# Patient Record
Sex: Female | Born: 1940
Health system: Southern US, Community
[De-identification: ages and names within clinical notes are randomized; demographics above are authoritative.]

## PROBLEM LIST (undated history)

## (undated) DIAGNOSIS — M797 Fibromyalgia: Secondary | ICD-10-CM

## (undated) DIAGNOSIS — R42 Dizziness and giddiness: Secondary | ICD-10-CM

## (undated) DIAGNOSIS — K5790 Diverticulosis of intestine, part unspecified, without perforation or abscess without bleeding: Secondary | ICD-10-CM

## (undated) DIAGNOSIS — K589 Irritable bowel syndrome without diarrhea: Secondary | ICD-10-CM

## (undated) DIAGNOSIS — K2970 Gastritis, unspecified, without bleeding: Secondary | ICD-10-CM

## (undated) DIAGNOSIS — C50919 Malignant neoplasm of unspecified site of unspecified female breast: Secondary | ICD-10-CM

## (undated) DIAGNOSIS — M1990 Unspecified osteoarthritis, unspecified site: Secondary | ICD-10-CM

## (undated) DIAGNOSIS — E039 Hypothyroidism, unspecified: Secondary | ICD-10-CM

## (undated) DIAGNOSIS — J189 Pneumonia, unspecified organism: Secondary | ICD-10-CM

## (undated) DIAGNOSIS — I2510 Atherosclerotic heart disease of native coronary artery without angina pectoris: Secondary | ICD-10-CM

## (undated) DIAGNOSIS — F32A Depression, unspecified: Secondary | ICD-10-CM

## (undated) DIAGNOSIS — K222 Esophageal obstruction: Secondary | ICD-10-CM

## (undated) DIAGNOSIS — S060X9A Concussion with loss of consciousness of unspecified duration, initial encounter: Secondary | ICD-10-CM

## (undated) DIAGNOSIS — E7800 Pure hypercholesterolemia, unspecified: Secondary | ICD-10-CM

## (undated) DIAGNOSIS — K449 Diaphragmatic hernia without obstruction or gangrene: Secondary | ICD-10-CM

## (undated) DIAGNOSIS — K635 Polyp of colon: Secondary | ICD-10-CM

## (undated) DIAGNOSIS — I6529 Occlusion and stenosis of unspecified carotid artery: Secondary | ICD-10-CM

## (undated) DIAGNOSIS — K2990 Gastroduodenitis, unspecified, without bleeding: Secondary | ICD-10-CM

## (undated) DIAGNOSIS — K219 Gastro-esophageal reflux disease without esophagitis: Secondary | ICD-10-CM

## (undated) DIAGNOSIS — G4730 Sleep apnea, unspecified: Secondary | ICD-10-CM

## (undated) DIAGNOSIS — F329 Major depressive disorder, single episode, unspecified: Secondary | ICD-10-CM

## (undated) DIAGNOSIS — R112 Nausea with vomiting, unspecified: Secondary | ICD-10-CM

## (undated) DIAGNOSIS — Z5189 Encounter for other specified aftercare: Secondary | ICD-10-CM

## (undated) DIAGNOSIS — E119 Type 2 diabetes mellitus without complications: Secondary | ICD-10-CM

## (undated) DIAGNOSIS — T7840XA Allergy, unspecified, initial encounter: Secondary | ICD-10-CM

## (undated) DIAGNOSIS — M810 Age-related osteoporosis without current pathological fracture: Secondary | ICD-10-CM

## (undated) DIAGNOSIS — Z9889 Other specified postprocedural states: Secondary | ICD-10-CM

## (undated) DIAGNOSIS — K8020 Calculus of gallbladder without cholecystitis without obstruction: Secondary | ICD-10-CM

## (undated) DIAGNOSIS — D649 Anemia, unspecified: Secondary | ICD-10-CM

## (undated) DIAGNOSIS — N200 Calculus of kidney: Secondary | ICD-10-CM

## (undated) DIAGNOSIS — I209 Angina pectoris, unspecified: Secondary | ICD-10-CM

## (undated) DIAGNOSIS — I10 Essential (primary) hypertension: Secondary | ICD-10-CM

## (undated) DIAGNOSIS — F419 Anxiety disorder, unspecified: Secondary | ICD-10-CM

## (undated) DIAGNOSIS — E079 Disorder of thyroid, unspecified: Secondary | ICD-10-CM

## (undated) HISTORY — DX: Calculus of gallbladder without cholecystitis without obstruction: K80.20

## (undated) HISTORY — DX: Allergy, unspecified, initial encounter: T78.40XA

## (undated) HISTORY — PX: APPENDECTOMY: SHX54

## (undated) HISTORY — DX: Gastro-esophageal reflux disease without esophagitis: K21.9

## (undated) HISTORY — DX: Atherosclerotic heart disease of native coronary artery without angina pectoris: I25.10

## (undated) HISTORY — DX: Age-related osteoporosis without current pathological fracture: M81.0

## (undated) HISTORY — DX: Irritable bowel syndrome, unspecified: K58.9

## (undated) HISTORY — DX: Major depressive disorder, single episode, unspecified: F32.9

## (undated) HISTORY — PX: BREAST LUMPECTOMY: SHX2

## (undated) HISTORY — DX: Calculus of kidney: N20.0

## (undated) HISTORY — DX: Depression, unspecified: F32.A

## (undated) HISTORY — PX: DILATION AND CURETTAGE OF UTERUS: SHX78

## (undated) HISTORY — PX: CHOLECYSTECTOMY: SHX55

## (undated) HISTORY — DX: Fibromyalgia: M79.7

## (undated) HISTORY — DX: Pure hypercholesterolemia, unspecified: E78.00

## (undated) HISTORY — DX: Esophageal obstruction: K22.2

## (undated) HISTORY — DX: Polyp of colon: K63.5

## (undated) HISTORY — DX: Malignant neoplasm of unspecified site of unspecified female breast: C50.919

## (undated) HISTORY — PX: CYSTOCELE REPAIR: SHX163

## (undated) HISTORY — DX: Sleep apnea, unspecified: G47.30

## (undated) HISTORY — DX: Anemia, unspecified: D64.9

## (undated) HISTORY — DX: Diaphragmatic hernia without obstruction or gangrene: K44.9

## (undated) HISTORY — DX: Gastritis, unspecified, without bleeding: K29.70

## (undated) HISTORY — DX: Diverticulosis of intestine, part unspecified, without perforation or abscess without bleeding: K57.90

## (undated) HISTORY — DX: Type 2 diabetes mellitus without complications: E11.9

## (undated) HISTORY — DX: Anxiety disorder, unspecified: F41.9

## (undated) HISTORY — DX: Essential (primary) hypertension: I10

## (undated) HISTORY — DX: Disorder of thyroid, unspecified: E07.9

## (undated) HISTORY — DX: Concussion with loss of consciousness of unspecified duration, initial encounter: S06.0X9A

## (undated) HISTORY — DX: Gastroduodenitis, unspecified, without bleeding: K29.90

## (undated) HISTORY — DX: Encounter for other specified aftercare: Z51.89

## (undated) HISTORY — PX: CERVICAL FUSION: SHX112

## (undated) HISTORY — PX: TONSILLECTOMY: SUR1361

## (undated) HISTORY — PX: THYROIDECTOMY: SHX17

## (undated) HISTORY — PX: RECTOCELE REPAIR: SHX761

## (undated) HISTORY — PX: CARDIAC CATHETERIZATION: SHX172

## (undated) HISTORY — PX: TOTAL ABDOMINAL HYSTERECTOMY: SHX209

## (undated) NOTE — *Deleted (*Deleted)
PROGRESS NOTE    Jeanne Kim  ZOX:[id number] DOB: 04-17-1941 DOA: 08/05/2020 PCP: Toma Deiters, MD   Brief Narrative: Jeanne Kim is a 12 y.o. female, past medical history of hypertension, CAD, hypothyroidism, breast cancer, fibromyalgia, irritable bowel syndrome, hypothyroidism, diabetes mellitus ED secondary to melena, patient with history of irritable bowel syndrome, most recent endoscopy 2017 with evidence of gastric ulcer, colonoscopy 2018 with internal hemorrhoids and diverticulosis. Patient presented secondary to melena and found to have associated acute anemia with concern for upper GI bleed.   Assessment & Plan:   Active Problems:   HYPERCHOLESTEROLEMIA  IIA   Essential hypertension   Coronary atherosclerosis   Esophageal reflux   IBS (irritable bowel syndrome)   DM (diabetes mellitus) (HCC)   Diverticulosis of colon   Adult hypothyroidism   GI bleed   Symptomatic anemia Acute blood loss anemia Baseline hemoglobin of 9 from September 2021. Hemoglobin of 1.1 on admission secondary to blood loss from GI bleeding. Patient received 1 unit of PRBC. Hemoglobin rebound to 8 and stable at 7.8. Recurrent melena overnight -Check CBC today  Upper GI bleed History of gastric ulcers and associated melena. Patient is s/p EGD on 10/30 with evidence of multiple non-bleeding gastric ulcers and LA Grade A reflux esophagitis without bleeding. Recurrent melena which may be due to continuing bleeding vs residual from prior bleeding. -GI recommendations: Okay for discharge home yesterday -CBC as mentioned above  History of CAD On aspirin as an outpatient. Held secondary to above. Also on Toprol XL.  -Continue Toprol XL  Essential hypertension Patient is on ramipril and Toprol XL as an outpatient -Continue Toprol  Fibromyalgia Chronic pain syndrome Patient is on Fentanyl patch, gabapentin as an outpatient -Resume Fentanyl patch and continue gabapentin  Irritable bowel  syndrome -Continue linzess  Diabetes mellitus, type 2 Patient is on Lantus, metformin, glipizide, glyburide and Novolog -Continue Lantus and SSI  Hyperlipidemia Patient was on a statin for which she has stopped taking. Outpatient follow-up.  Hypokalemia Mild. Given potassium in IV fluids -Kdur 40 meq x2  Hyponatremia Mild. Improved with IV fluids. Can recheck BMP as an outpatient.  History of UTI Completed course outpatient.  Hypothyroidism -Continue Synthroid  Possible orthostatic hypotension Patient is on midodrine for lightheadedness when getting out of bed in the morning. This is a chronic medication for which she takes in addition to antihypertensives. -Continue midodrine   DVT prophylaxis: SCDs Code Status:   Code Status: Full Code Family Communication: None at bedside Disposition Plan: Home likely in 1-2 days pending GI recommendations/management***   Consultants:   Gastroenterology  Procedures:   None  Antimicrobials:  None    Subjective: Recurrent melena reported as black and mucous.  Objective: Vitals:   08/06/20 1100 08/06/20 1347 08/06/20 2233 08/07/20 0500  BP: (!) 109/42 127/80 (!) 121/52 (!) 127/50  Pulse: 73 (!) 111 81   Resp: 19 18  17   Temp:  98.2 F (36.8 C) 98.4 F (36.9 C) 99 F (37.2 C)  TempSrc:   Oral Oral  SpO2: 97% 99% 95% 96%  Weight:      Height:        Intake/Output Summary (Last 24 hours) at 08/07/2020 0750 Last data filed at 08/06/2020 1821 Gross per 24 hour  Intake 468.99 ml  Output -  Net 468.99 ml   Filed Weights   08/05/20 1726 08/05/20 2246  Weight: 77.1 kg 80.8 kg    Examination:  General exam: Appears calm and comfortable  Respiratory system: Clear to auscultation. Respiratory effort normal. Cardiovascular system: S1 & S2 heard, RRR. No murmurs, rubs, gallops or clicks. Gastrointestinal system: Abdomen is nondistended, soft and nontender. No organomegaly or masses felt. Normal bowel sounds heard.  Central nervous system: Alert and oriented. No focal neurological deficits. Musculoskeletal: No edema. No calf tenderness Skin: No cyanosis. No rashes Psychiatry: Judgement and insight appear normal. Mood & affect appropriate.     Data Reviewed: I have personally reviewed following labs and imaging studies  CBC Lab Results  Component Value Date   WBC 9.7 08/06/2020   RBC 3.25 (L) 08/06/2020   HGB 7.8 (L) 08/06/2020   HCT 26.1 (L) 08/06/2020   MCV 80.3 08/06/2020   MCH 24.0 (L) 08/06/2020   PLT 407 (H) 08/06/2020   MCHC 29.9 (L) 08/06/2020   RDW 19.0 (H) 08/06/2020   LYMPHSABS 2.8 08/05/2020   MONOABS 2.0 (H) 08/05/2020   EOSABS 1.2 (H) 08/05/2020   BASOSABS 0.1 08/05/2020     Last metabolic panel Lab Results  Component Value Date   NA 133 (L) 08/06/2020   K 3.1 (L) 08/06/2020   CL 96 (L) 08/06/2020   CO2 28 08/06/2020   BUN 15 08/06/2020   CREATININE 0.78 08/06/2020   GLUCOSE 172 (H) 08/06/2020   GFRNONAA >60 08/06/2020   GFRAA 72 07/14/2020   CALCIUM 8.4 (L) 08/06/2020   PROT 7.0 08/05/2020   ALBUMIN 3.7 08/05/2020   BILITOT 0.6 08/05/2020   ALKPHOS 52 08/05/2020   AST 18 08/05/2020   ALT 13 08/05/2020   ANIONGAP 9 08/06/2020    CBG (last 3)  Recent Labs    08/06/20 1646 08/06/20 2211 08/07/20 0729  GLUCAP 239* 271* 151*     GFR: Estimated Creatinine Clearance: 60.5 mL/min (by C-G formula based on SCr of 0.78 mg/dL).  Coagulation Profile: Recent Labs  Lab 08/05/20 1805  INR 1.0    Recent Results (from the past 240 hour(s))  Resp Panel by RT PCR (RSV, Flu A&B, Covid) - Nasopharyngeal Swab     Status: None   Collection Time: 08/05/20  6:43 PM   Specimen: Nasopharyngeal Swab  Result Value Ref Range Status   SARS Coronavirus 2 by RT PCR NEGATIVE NEGATIVE Final    Comment: (NOTE) SARS-CoV-2 target nucleic acids are NOT DETECTED.  The SARS-CoV-2 RNA is generally detectable in upper respiratoy specimens during the acute phase of infection. The  lowest concentration of SARS-CoV-2 viral copies this assay can detect is 131 copies/mL. A negative result does not preclude SARS-Cov-2 infection and should not be used as the sole basis for treatment or other patient management decisions. A negative result may occur with  improper specimen collection/handling, submission of specimen other than nasopharyngeal swab, presence of viral mutation(s) within the areas targeted by this assay, and inadequate number of viral copies (<131 copies/mL). A negative result must be combined with clinical observations, patient history, and epidemiological information. The expected result is Negative.  Fact Sheet for Patients:  https://www.moore.com/  Fact Sheet for Healthcare Providers:  https://www.young.biz/  This test is no t yet approved or cleared by the [place] FDA and  has been authorized for detection and/or diagnosis of SARS-CoV-2 by FDA under an Emergency Use Authorization (EUA). This EUA will remain  in effect (meaning this test can be used) for the duration of the COVID-19 declaration under Section 564(b)(1) of the Act, 21 U.S.C. section 360bbb-3(b)(1), unless the authorization is terminated or revoked sooner.     Influenza A by  PCR NEGATIVE NEGATIVE Final   Influenza B by PCR NEGATIVE NEGATIVE Final    Comment: (NOTE) The Xpert Xpress SARS-CoV-2/FLU/RSV assay is intended as an aid in  the diagnosis of influenza from Nasopharyngeal swab specimens and  should not be used as a sole basis for treatment. Nasal washings and  aspirates are unacceptable for Xpert Xpress SARS-CoV-2/FLU/RSV  testing.  Fact Sheet for Patients: https://www.moore.com/  Fact Sheet for Healthcare Providers: https://www.young.biz/  This test is not yet approved or cleared by the [place] FDA and  has been authorized for detection and/or diagnosis of SARS-CoV-2 by  FDA under  an Emergency Use Authorization (EUA). This EUA will remain  in effect (meaning this test can be used) for the duration of the  Covid-19 declaration under Section 564(b)(1) of the Act, 21  U.S.C. section 360bbb-3(b)(1), unless the authorization is  terminated or revoked.    Respiratory Syncytial Virus by PCR NEGATIVE NEGATIVE Final    Comment: (NOTE) Fact Sheet for Patients: https://www.moore.com/  Fact Sheet for Healthcare Providers: https://www.young.biz/  This test is not yet approved or cleared by the [place] FDA and  has been authorized for detection and/or diagnosis of SARS-CoV-2 by  FDA under an Emergency Use Authorization (EUA). This EUA will remain  in effect (meaning this test can be used) for the duration of the  COVID-19 declaration under Section 564(b)(1) of the Act, 21 U.S.C.  section 360bbb-3(b)(1), unless the authorization is terminated or  revoked. Performed at [facility], [address] [place], [place] [address]         Radiology Studies: CT ABDOMEN PELVIS W WO CONTRAST  Result Date: 08/06/2020 CLINICAL DATA:  Left lower quadrant abdominal pain, melena, EXAM: CT ABDOMEN AND PELVIS WITHOUT AND WITH CONTRAST TECHNIQUE: Multidetector CT imaging of the abdomen and pelvis was performed following the standard protocol before and following the bolus administration of intravenous contrast. CONTRAST:  OMNIPAQUE IOHEXOL 300 MG/ML  SOLN COMPARISON:  None. FINDINGS: Lower chest: The visualized lung bases are clear bilaterally. The visualized heart and pericardium are unremarkable. Moderate hiatal hernia. Hepatobiliary: Status post cholecystectomy. Liver unremarkable. No intra or extrahepatic biliary ductal dilation. Pancreas: Diffusely atrophic, but otherwise unremarkable. Spleen: Unremarkable Adrenals/Urinary Tract: Adrenal glands unremarkable. Kidneys are normal. The bladder is partially obscured by streak artifact from left  total hip arthroplasty, however, the visualized portion is unremarkable peer Stomach/Bowel: Intra-abdominal stomach is unremarkable. Small and large bowel are unremarkable. No evidence of obstruction or focal inflammation. Appendix normal. No free intraperitoneal gas or fluid. Tiny fat containing umbilical hernia. Vascular/Lymphatic: Moderate aortoiliac atherosclerotic calcification. No aneurysm. No pathologic adenopathy within the abdomen and pelvis. Reproductive: Status post hysterectomy. No adnexal masses. Other: Rectum unremarkable Musculoskeletal: No lytic or blastic bone lesions are seen. Degenerative changes are seen within the thoracolumbar spine. Left total hip arthroplasty has been performed. IMPRESSION: No radiographic explanation for the patient's reported symptoms. Incidental findings as noted above. Aortic Atherosclerosis (ICD10-I70.0). Electronically Signed   By: Helyn Numbers MD   On: 08/06/2020 00:24   DG Chest Port 1 View  Result Date: 08/05/2020 CLINICAL DATA:  Weakness. EXAM: PORTABLE CHEST 1 VIEW COMPARISON:  02/03/2020 rib/chest radiograph and prior. FINDINGS: Mild hypoinflation. No focal consolidation, pneumothorax or pleural effusion. Cardiomediastinal silhouette is unchanged. Aortic atherosclerotic calcifications. Sequela of ACDF. IMPRESSION: No focal airspace disease. Electronically Signed   By: Stana Bunting M.D.   On: 08/05/2020 18:15        Scheduled Meds: . doxepin  10 mg Oral  QHS  . fentaNYL  1 patch Transdermal Q72H  . gabapentin  300 mg Oral QHS  . insulin aspart  0-5 Units Subcutaneous QHS  . insulin aspart  0-9 Units Subcutaneous TID WC  . insulin glargine  15 Units Subcutaneous Daily  . levothyroxine  137 mcg Oral Daily  . metoprolol succinate  50 mg Oral Daily  . midodrine  2.5 mg Oral TID  . montelukast  10 mg Oral QHS  . [START ON 08/09/2020] pantoprazole  40 mg Intravenous Q12H  . tobramycin  1 drop Right Eye Q6H   Continuous Infusions: .  sodium chloride    . pantoprozole (PROTONIX) infusion 8 mg/hr (08/06/20 0054)     LOS: 2 days     Jacquelin Hawking, MD [facility] 08/07/2020, 7:50 AM  If 7PM-7AM, please contact night-coverage [email or web address]

---

## 1999-03-22 ENCOUNTER — Observation Stay (HOSPITAL_COMMUNITY): Admission: AD | Admit: 1999-03-22 | Discharge: 1999-03-23 | Payer: Self-pay | Admitting: Cardiology

## 1999-03-22 ENCOUNTER — Encounter: Payer: Self-pay | Admitting: Cardiology

## 1999-05-04 ENCOUNTER — Ambulatory Visit: Admission: RE | Admit: 1999-05-04 | Discharge: 1999-05-04 | Payer: Self-pay | Admitting: Pulmonary Disease

## 1999-07-07 ENCOUNTER — Ambulatory Visit: Admission: RE | Admit: 1999-07-07 | Discharge: 1999-07-07 | Payer: Self-pay | Admitting: Pulmonary Disease

## 2000-05-08 ENCOUNTER — Ambulatory Visit (HOSPITAL_COMMUNITY): Admission: RE | Admit: 2000-05-08 | Discharge: 2000-05-08 | Payer: Self-pay | Admitting: Gastroenterology

## 2000-05-08 ENCOUNTER — Encounter (INDEPENDENT_AMBULATORY_CARE_PROVIDER_SITE_OTHER): Payer: Self-pay | Admitting: Specialist

## 2000-09-10 ENCOUNTER — Encounter: Payer: Self-pay | Admitting: Gynecology

## 2000-09-10 ENCOUNTER — Other Ambulatory Visit: Admission: RE | Admit: 2000-09-10 | Discharge: 2000-09-10 | Payer: Self-pay | Admitting: Gynecology

## 2000-09-10 ENCOUNTER — Encounter: Admission: RE | Admit: 2000-09-10 | Discharge: 2000-09-10 | Payer: Self-pay | Admitting: Gynecology

## 2000-10-08 DIAGNOSIS — E079 Disorder of thyroid, unspecified: Secondary | ICD-10-CM

## 2000-10-08 HISTORY — DX: Disorder of thyroid, unspecified: E07.9

## 2001-04-17 ENCOUNTER — Encounter: Payer: Self-pay | Admitting: *Deleted

## 2001-04-17 ENCOUNTER — Encounter: Admission: RE | Admit: 2001-04-17 | Discharge: 2001-04-17 | Payer: Self-pay | Admitting: *Deleted

## 2001-05-01 ENCOUNTER — Encounter: Payer: Self-pay | Admitting: *Deleted

## 2001-05-02 ENCOUNTER — Ambulatory Visit (HOSPITAL_COMMUNITY): Admission: RE | Admit: 2001-05-02 | Discharge: 2001-05-02 | Payer: Self-pay | Admitting: *Deleted

## 2001-05-02 ENCOUNTER — Encounter: Payer: Self-pay | Admitting: Diagnostic Radiology

## 2002-05-17 ENCOUNTER — Emergency Department (HOSPITAL_COMMUNITY): Admission: EM | Admit: 2002-05-17 | Discharge: 2002-05-17 | Payer: Self-pay | Admitting: Emergency Medicine

## 2002-06-16 ENCOUNTER — Ambulatory Visit (HOSPITAL_BASED_OUTPATIENT_CLINIC_OR_DEPARTMENT_OTHER): Admission: RE | Admit: 2002-06-16 | Discharge: 2002-06-16 | Payer: Self-pay | Admitting: Pulmonary Disease

## 2003-01-27 ENCOUNTER — Encounter: Payer: Self-pay | Admitting: Gynecology

## 2003-01-27 ENCOUNTER — Other Ambulatory Visit: Admission: RE | Admit: 2003-01-27 | Discharge: 2003-01-27 | Payer: Self-pay | Admitting: Gynecology

## 2003-01-27 ENCOUNTER — Encounter: Admission: RE | Admit: 2003-01-27 | Discharge: 2003-01-27 | Payer: Self-pay | Admitting: Gynecology

## 2003-11-16 ENCOUNTER — Inpatient Hospital Stay (HOSPITAL_COMMUNITY): Admission: RE | Admit: 2003-11-16 | Discharge: 2003-11-19 | Payer: Self-pay | Admitting: Neurosurgery

## 2004-01-12 ENCOUNTER — Encounter: Payer: Self-pay | Admitting: Cardiology

## 2004-01-28 ENCOUNTER — Encounter: Admission: RE | Admit: 2004-01-28 | Discharge: 2004-01-28 | Payer: Self-pay | Admitting: Gynecology

## 2004-01-28 ENCOUNTER — Other Ambulatory Visit: Admission: RE | Admit: 2004-01-28 | Discharge: 2004-01-28 | Payer: Self-pay | Admitting: Gynecology

## 2004-10-05 ENCOUNTER — Inpatient Hospital Stay (HOSPITAL_COMMUNITY): Admission: EM | Admit: 2004-10-05 | Discharge: 2004-10-06 | Payer: Self-pay | Admitting: Emergency Medicine

## 2004-10-05 ENCOUNTER — Ambulatory Visit: Payer: Self-pay | Admitting: Cardiology

## 2004-10-12 ENCOUNTER — Ambulatory Visit: Payer: Self-pay | Admitting: Cardiology

## 2004-10-17 ENCOUNTER — Inpatient Hospital Stay (HOSPITAL_BASED_OUTPATIENT_CLINIC_OR_DEPARTMENT_OTHER): Admission: RE | Admit: 2004-10-17 | Discharge: 2004-10-17 | Payer: Self-pay | Admitting: Cardiology

## 2004-10-25 ENCOUNTER — Ambulatory Visit: Payer: Self-pay | Admitting: Cardiology

## 2004-11-01 ENCOUNTER — Ambulatory Visit: Payer: Self-pay | Admitting: Cardiology

## 2004-11-01 ENCOUNTER — Ambulatory Visit: Payer: Self-pay

## 2004-11-17 ENCOUNTER — Ambulatory Visit: Payer: Self-pay | Admitting: Cardiology

## 2004-11-21 ENCOUNTER — Ambulatory Visit: Payer: Self-pay | Admitting: Cardiology

## 2004-12-26 ENCOUNTER — Ambulatory Visit: Payer: Self-pay | Admitting: Cardiology

## 2005-01-03 ENCOUNTER — Ambulatory Visit: Payer: Self-pay | Admitting: Cardiology

## 2005-01-29 ENCOUNTER — Other Ambulatory Visit: Admission: RE | Admit: 2005-01-29 | Discharge: 2005-01-29 | Payer: Self-pay | Admitting: Gynecology

## 2005-01-29 ENCOUNTER — Encounter: Admission: RE | Admit: 2005-01-29 | Discharge: 2005-01-29 | Payer: Self-pay | Admitting: Gynecology

## 2005-02-05 ENCOUNTER — Encounter: Admission: RE | Admit: 2005-02-05 | Discharge: 2005-02-05 | Payer: Self-pay | Admitting: Gynecology

## 2005-08-22 ENCOUNTER — Encounter: Admission: RE | Admit: 2005-08-22 | Discharge: 2005-08-22 | Payer: Self-pay | Admitting: Gynecology

## 2005-11-13 ENCOUNTER — Ambulatory Visit: Payer: Self-pay | Admitting: Gastroenterology

## 2005-11-28 ENCOUNTER — Ambulatory Visit: Payer: Self-pay | Admitting: Cardiology

## 2006-01-24 ENCOUNTER — Other Ambulatory Visit: Admission: RE | Admit: 2006-01-24 | Discharge: 2006-01-24 | Payer: Self-pay | Admitting: Gynecology

## 2006-01-31 ENCOUNTER — Ambulatory Visit: Payer: Self-pay | Admitting: Cardiology

## 2006-02-08 ENCOUNTER — Encounter: Admission: RE | Admit: 2006-02-08 | Discharge: 2006-02-08 | Payer: Self-pay | Admitting: Gynecology

## 2006-03-26 ENCOUNTER — Ambulatory Visit: Payer: Self-pay | Admitting: Gastroenterology

## 2006-03-27 ENCOUNTER — Encounter (INDEPENDENT_AMBULATORY_CARE_PROVIDER_SITE_OTHER): Payer: Self-pay | Admitting: *Deleted

## 2006-03-27 ENCOUNTER — Ambulatory Visit: Payer: Self-pay | Admitting: Gastroenterology

## 2006-04-23 ENCOUNTER — Ambulatory Visit: Payer: Self-pay | Admitting: Gastroenterology

## 2006-04-25 ENCOUNTER — Ambulatory Visit (HOSPITAL_COMMUNITY): Admission: RE | Admit: 2006-04-25 | Discharge: 2006-04-25 | Payer: Self-pay | Admitting: Gastroenterology

## 2006-05-24 ENCOUNTER — Ambulatory Visit: Payer: Self-pay | Admitting: Gastroenterology

## 2006-09-19 ENCOUNTER — Ambulatory Visit: Payer: Self-pay | Admitting: Cardiology

## 2007-03-05 ENCOUNTER — Other Ambulatory Visit: Admission: RE | Admit: 2007-03-05 | Discharge: 2007-03-05 | Payer: Self-pay | Admitting: Gynecology

## 2007-03-05 ENCOUNTER — Encounter: Admission: RE | Admit: 2007-03-05 | Discharge: 2007-03-05 | Payer: Self-pay | Admitting: Gynecology

## 2007-04-24 ENCOUNTER — Ambulatory Visit: Payer: Self-pay | Admitting: Cardiology

## 2007-04-25 ENCOUNTER — Ambulatory Visit: Payer: Self-pay | Admitting: Cardiology

## 2007-04-25 LAB — CONVERTED CEMR LAB
ALT: 23 units/L (ref 0–35)
AST: 23 units/L (ref 0–37)
Bilirubin, Direct: 0.2 mg/dL (ref 0.0–0.3)
CO2: 34 meq/L — ABNORMAL HIGH (ref 19–32)
Calcium: 9 mg/dL (ref 8.4–10.5)
Chloride: 100 meq/L (ref 96–112)
Cholesterol: 100 mg/dL (ref 0–200)
Creatinine, Ser: 0.6 mg/dL (ref 0.4–1.2)
GFR calc non Af Amer: 107 mL/min
Glucose, Bld: 148 mg/dL — ABNORMAL HIGH (ref 70–99)
HDL: 39.7 mg/dL (ref >39.0)
LDL Cholesterol: 39 mg/dL (ref 0–99)
Total Bilirubin: 1.9 mg/dL — ABNORMAL HIGH (ref 0.3–1.2)

## 2007-06-02 ENCOUNTER — Ambulatory Visit: Payer: Self-pay | Admitting: Cardiology

## 2007-06-02 LAB — CONVERTED CEMR LAB
CO2: 36 meq/L — ABNORMAL HIGH (ref 19–32)
Creatinine, Ser: 0.6 mg/dL (ref 0.4–1.2)
GFR calc Af Amer: 129 mL/min
Potassium: 4.3 meq/L (ref 3.5–5.1)
Sodium: 142 meq/L (ref 135–145)

## 2007-08-05 ENCOUNTER — Ambulatory Visit: Payer: Self-pay | Admitting: Gastroenterology

## 2007-08-08 ENCOUNTER — Ambulatory Visit (HOSPITAL_COMMUNITY): Admission: RE | Admit: 2007-08-08 | Discharge: 2007-08-08 | Payer: Self-pay | Admitting: Gastroenterology

## 2007-09-02 ENCOUNTER — Ambulatory Visit: Payer: Self-pay | Admitting: Cardiology

## 2007-09-05 ENCOUNTER — Ambulatory Visit: Payer: Self-pay

## 2007-09-05 ENCOUNTER — Encounter: Payer: Self-pay | Admitting: Cardiology

## 2007-09-11 ENCOUNTER — Ambulatory Visit: Payer: Self-pay | Admitting: Gastroenterology

## 2007-09-11 LAB — CONVERTED CEMR LAB: Tissue Transglutaminase Ab, IgA: 0.7 units (ref <7)

## 2007-09-15 ENCOUNTER — Ambulatory Visit (HOSPITAL_COMMUNITY): Admission: RE | Admit: 2007-09-15 | Discharge: 2007-09-15 | Payer: Self-pay | Admitting: Cardiology

## 2007-09-23 ENCOUNTER — Ambulatory Visit: Payer: Self-pay | Admitting: Gastroenterology

## 2007-09-23 LAB — CONVERTED CEMR LAB
Eosinophils Absolute: 0.3 10*3/uL (ref 0.0–0.6)
Folate: >20 ng/mL
Lymphocytes Relative: 25 % (ref 12.0–46.0)
MCV: 84.7 fL (ref 78.0–100.0)
Monocytes Relative: 8.7 % (ref 3.0–11.0)
Neutro Abs: 6.9 10*3/uL (ref 1.4–7.7)
Platelets: 318 10*3/uL (ref 150–400)
Vitamin B-12: 261 pg/mL (ref 211–911)

## 2007-12-03 ENCOUNTER — Ambulatory Visit: Payer: Self-pay | Admitting: Cardiology

## 2008-01-14 ENCOUNTER — Ambulatory Visit: Payer: Self-pay | Admitting: Cardiology

## 2008-03-22 ENCOUNTER — Encounter: Admission: RE | Admit: 2008-03-22 | Discharge: 2008-03-22 | Payer: Self-pay | Admitting: Gynecology

## 2008-05-04 DIAGNOSIS — E1165 Type 2 diabetes mellitus with hyperglycemia: Secondary | ICD-10-CM | POA: Insufficient documentation

## 2008-05-04 DIAGNOSIS — E119 Type 2 diabetes mellitus without complications: Secondary | ICD-10-CM | POA: Insufficient documentation

## 2008-05-04 DIAGNOSIS — E041 Nontoxic single thyroid nodule: Secondary | ICD-10-CM | POA: Insufficient documentation

## 2008-06-09 ENCOUNTER — Ambulatory Visit: Payer: Self-pay | Admitting: Cardiology

## 2008-07-13 ENCOUNTER — Ambulatory Visit: Payer: Self-pay | Admitting: Cardiology

## 2008-07-13 LAB — CONVERTED CEMR LAB
BUN: 12 mg/dL (ref 6–23)
CO2: 34 meq/L — ABNORMAL HIGH (ref 19–32)
Chloride: 98 meq/L (ref 96–112)
Creatinine, Ser: 0.6 mg/dL (ref 0.4–1.2)
Glucose, Bld: 128 mg/dL — ABNORMAL HIGH (ref 70–99)

## 2008-12-08 ENCOUNTER — Ambulatory Visit: Payer: Self-pay | Admitting: Cardiology

## 2008-12-28 ENCOUNTER — Ambulatory Visit: Payer: Self-pay | Admitting: Cardiology

## 2008-12-28 LAB — CONVERTED CEMR LAB
AST: 20 units/L (ref 0–37)
Alkaline Phosphatase: 57 units/L (ref 39–117)
BUN: 14 mg/dL (ref 6–23)
CO2: 33 meq/L — ABNORMAL HIGH (ref 19–32)
Calcium: 8.7 mg/dL (ref 8.4–10.5)
Creatinine, Ser: 0.6 mg/dL (ref 0.4–1.2)
Glucose, Bld: 172 mg/dL — ABNORMAL HIGH (ref 70–99)
HDL: 39.8 mg/dL (ref >39.00)
Total Bilirubin: 1.1 mg/dL (ref 0.3–1.2)
Total CHOL/HDL Ratio: 3

## 2009-03-23 ENCOUNTER — Encounter: Admission: RE | Admit: 2009-03-23 | Discharge: 2009-03-23 | Payer: Self-pay | Admitting: Gynecology

## 2009-04-07 DIAGNOSIS — K589 Irritable bowel syndrome without diarrhea: Secondary | ICD-10-CM | POA: Insufficient documentation

## 2009-04-07 DIAGNOSIS — M1990 Unspecified osteoarthritis, unspecified site: Secondary | ICD-10-CM | POA: Insufficient documentation

## 2009-04-07 DIAGNOSIS — E663 Overweight: Secondary | ICD-10-CM | POA: Insufficient documentation

## 2009-04-07 DIAGNOSIS — E7800 Pure hypercholesterolemia, unspecified: Secondary | ICD-10-CM | POA: Insufficient documentation

## 2009-04-07 DIAGNOSIS — I10 Essential (primary) hypertension: Secondary | ICD-10-CM | POA: Insufficient documentation

## 2009-04-07 DIAGNOSIS — I2510 Atherosclerotic heart disease of native coronary artery without angina pectoris: Secondary | ICD-10-CM | POA: Insufficient documentation

## 2009-04-07 DIAGNOSIS — E119 Type 2 diabetes mellitus without complications: Secondary | ICD-10-CM | POA: Insufficient documentation

## 2009-04-07 DIAGNOSIS — IMO0002 Reserved for concepts with insufficient information to code with codable children: Secondary | ICD-10-CM | POA: Insufficient documentation

## 2009-04-07 DIAGNOSIS — IMO0001 Reserved for inherently not codable concepts without codable children: Secondary | ICD-10-CM | POA: Insufficient documentation

## 2009-04-07 DIAGNOSIS — G4733 Obstructive sleep apnea (adult) (pediatric): Secondary | ICD-10-CM | POA: Insufficient documentation

## 2009-04-08 ENCOUNTER — Encounter: Admission: RE | Admit: 2009-04-08 | Discharge: 2009-04-08 | Payer: Self-pay | Admitting: Gynecology

## 2009-04-15 ENCOUNTER — Telehealth: Payer: Self-pay | Admitting: Cardiology

## 2009-04-18 ENCOUNTER — Encounter: Admission: RE | Admit: 2009-04-18 | Discharge: 2009-04-18 | Payer: Self-pay | Admitting: Gynecology

## 2009-04-27 ENCOUNTER — Ambulatory Visit: Payer: Self-pay | Admitting: Cardiology

## 2009-04-27 DIAGNOSIS — E039 Hypothyroidism, unspecified: Secondary | ICD-10-CM | POA: Insufficient documentation

## 2009-04-28 ENCOUNTER — Telehealth: Payer: Self-pay | Admitting: Cardiology

## 2009-06-06 ENCOUNTER — Ambulatory Visit: Payer: Self-pay | Admitting: Cardiology

## 2009-07-05 ENCOUNTER — Ambulatory Visit: Admission: RE | Admit: 2009-07-05 | Discharge: 2009-09-13 | Payer: Self-pay | Admitting: Radiation Oncology

## 2009-10-12 ENCOUNTER — Ambulatory Visit: Admission: RE | Admit: 2009-10-12 | Discharge: 2009-12-09 | Payer: Self-pay | Admitting: Radiation Oncology

## 2009-12-08 ENCOUNTER — Telehealth: Payer: Self-pay | Admitting: Cardiology

## 2009-12-09 ENCOUNTER — Ambulatory Visit: Payer: Self-pay | Admitting: Cardiology

## 2009-12-09 DIAGNOSIS — C50919 Malignant neoplasm of unspecified site of unspecified female breast: Secondary | ICD-10-CM | POA: Insufficient documentation

## 2009-12-09 DIAGNOSIS — R072 Precordial pain: Secondary | ICD-10-CM | POA: Insufficient documentation

## 2009-12-09 DIAGNOSIS — R079 Chest pain, unspecified: Secondary | ICD-10-CM | POA: Insufficient documentation

## 2009-12-09 DIAGNOSIS — Z853 Personal history of malignant neoplasm of breast: Secondary | ICD-10-CM | POA: Insufficient documentation

## 2010-01-24 ENCOUNTER — Ambulatory Visit: Payer: Self-pay | Admitting: Cardiology

## 2010-02-06 LAB — CONVERTED CEMR LAB
CO2: 32 meq/L (ref 19–32)
Calcium: 8.8 mg/dL (ref 8.4–10.5)
Glucose, Bld: 259 mg/dL — ABNORMAL HIGH (ref 70–99)
Sodium: 138 meq/L (ref 135–145)

## 2010-09-29 ENCOUNTER — Encounter: Payer: Self-pay | Admitting: Cardiology

## 2010-09-29 ENCOUNTER — Ambulatory Visit: Payer: Self-pay | Admitting: Cardiology

## 2010-09-29 LAB — CONVERTED CEMR LAB
HDL goal, serum: 40 mg/dL
LDL Goal: 70 mg/dL

## 2010-10-29 ENCOUNTER — Encounter: Payer: Self-pay | Admitting: Gastroenterology

## 2010-10-30 ENCOUNTER — Encounter: Payer: Self-pay | Admitting: Surgery

## 2010-11-07 NOTE — Assessment & Plan Note (Signed)
Summary: per check out/sf   Visit Type:  Follow-up Primary Provider:  Sherryll Burger  CC:  Left arm pain.  History of Present Illness: Patient had surgery for breast cancer, and is now in has been through aggressive chemotherapy.  She does get shortness of breath with exertion.  At night, she will have some inspiratory wheezing.  She is not sure of the source of this.  She has some sleep apnea, and is confessing that she does not wear this every night.  Denies chest pain of signficance.     Current Medications (verified): 1)  Lipitor 20 Mg Tabs (Atorvastatin Calcium) .... Take One Tablet By Mouth Daily. 2)  Aspirin Ec 325 Mg Tbec (Aspirin) .... Take One Tablet By Mouth Daily 3)  Oscal 500/200 D-3 500-200 Mg-Unit Tabs (Calcium-Vitamin D) .... Take 1 Tablet By Mouth Three Times A Day 4)  Nexium 40 Mg Cpdr (Esomeprazole Magnesium) .... Take 1 Capsule By Mouth Once A Day At Bedtime 5)  Glucovance 5-500 Mg Tabs (Glyburide-Metformin) .... 2 Tabs Two Times A Day 6)  Levemir 100 Unit/ml Soln (Insulin Detemir) .... Prn 7)  Synthroid 150 Mcg Tabs (Levothyroxine Sodium) .... Take 1 Tablet By Mouth Once A Day 8)  Toprol Xl 50 Mg Xr24h-Tab (Metoprolol Succinate) .... Take 1 Tablet By Mouth Once A Day 9)  Singulair 10 Mg Tabs (Montelukast Sodium) .... Take 1 Tablet By Mouth Once A Day 10)  Altace 10 Mg Caps (Ramipril) .... Take 1 Capsule By Mouth Once A Day 11)  Maxzide-25 37.5-25 Mg Tabs (Triamterene-Hctz) .... Take 1 Tablet By Mouth Once A Day 12)  Xanax 0.5 Mg Tabs (Alprazolam) .... Take 1 Tablet By Mouth Once A Day 13)  Coq10 100 Mg Caps (Coenzyme Q10) .... Take 1 Capsule By Mouth Once A Day 14)  Cymbalta 60 Mg Cpep (Duloxetine Hcl) .... Take 1 Tablet By Mouth Once A Day 15)  Flonase 50 Mcg/act Susp (Fluticasone Propionate) .... Once A Day 16)  Trazodone Hcl 100 Mg Tabs (Trazodone Hcl) .... As Needed 17)  Aromasin 25 Mg Tabs (Exemestane) .... Take 1 Tablet By Mouth Once A Day  Allergies: 1)  ! Codeine 2)   ! Sulfa  Past History:  Past Medical History: Last updated: 04/27/2009 DEGENERATIVE DISC DISEASE (ICD-722.6) DEGENERATIVE JOINT DISEASE (ICD-715.90) FIBROMYALGIA (ICD-729.1) IRRITABLE BOWEL SYNDROME (ICD-564.1) HYPERTENSION, UNSPECIFIED (ICD-401.9) OBSTRUCTIVE SLEEP APNEA (ICD-327.23) DIABETES MELLITUS (ICD-250.00) HYPERCHOLESTEROLEMIA  IIA (ICD-272.0) CAD, UNSPECIFIED SITE (ICD-414.00) OVERWEIGHT/OBESITY (ICD-278.02) Hypothyroidism    Past Surgical History: Abdominal Hysterectomy-Total Cholecystectomy C-section resection of a pelvic mass breast cancer resection as lumpectomy.    Vital Signs:  Patient profile:   70 year old female Height:      65 inches Weight:      179.50 pounds BMI:     29.98 Pulse rate:   78 / minute Pulse rhythm:   regular Resp:     18 per minute BP sitting:   106 / 60  (right arm) Cuff size:   large  Vitals Entered By: Vikki Ports (January 24, 2010 1:03 PM)  Physical Exam  General:  Well developed, well nourished, in no acute distress. Head:  normocephalic and atraumatic Eyes:  PERRLA/EOM intact; conjunctiva and lids normal. Lungs:  Clear bilaterally to auscultation and percussion. Heart:  Normal S1 and S2.  No murmur, rub, or gallop.  Pulses:  pulses normal in all 4 extremities Extremities:  No clubbing or cyanosis. Neurologic:  Alert and oriented x 3.   Impression & Recommendations:  Problem #  1:  ADENOCARCINOMA, LEFT BREAST (ICD-174.9) recovering at present.  Has been through lumpectomy, chemo, and radiation.  Slow process, but gradually improving at present.   Problem # 2:  OBSTRUCTIVE SLEEP APNEA (ICD-327.23) Has not been wearing mask recently, and less more than more as a general rule.  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    Problem # 3:  CAD, UNSPECIFIED SITE (ICD-414.00) Has some intermittent chest pain, but it has not changed much in general.  Continue current management.   patient to contact us if change in status.   Her updated medication list for this problem includes:    Aspirin Ec 325 Mg Tbec (Aspirin) .Marland Kitchen... Take one tablet by mouth daily    Toprol Xl 50 Mg Xr24h-tab (Metoprolol succinate) .Marland Kitchen... Take 1 tablet by mouth once a day    Altace 10 Mg Caps (Ramipril) .Marland Kitchen... Take 1 capsule by mouth once a day  Orders: TLB-BMP (Basic Metabolic Panel-BMET) (80048-METABOL) TLB-TSH (Thyroid Stimulating Hormone) (84443-TSH)  Problem # 4:  HYPERCHOLESTEROLEMIA  IIA (ICD-272.0) Probably need to reassess levels and success of treatment at this point.  Her updated medication list for this problem includes:    Lipitor 20 Mg Tabs (Atorvastatin calcium) .Marland Kitchen... Take one tablet by mouth daily.  Problem # 5:  UNSPECIFIED HYPOTHYROIDISM (ICD-244.9) Need to check TSH, and will let her know results.   Her updated medication list for this problem includes:    Synthroid 150 Mcg Tabs (Levothyroxine sodium) .Marland Kitchen... Take 1 tablet by mouth once a day  Orders: TLB-TSH (Thyroid Stimulating Hormone) (84443-TSH)  Other Orders: EKG w/ Interpretation (93000)  Patient Instructions: 1)  Your physician recommends that you schedule a follow-up appointment in: 6 months 2)  Your physician recommends that you return for lab work in:  please draw TSH, BMET  Appended Document: per check out/sf ECG NSR, WNL.  No acute changes.

## 2010-11-07 NOTE — Assessment & Plan Note (Signed)
Summary: rov/ per dr. Riley Kill   Visit Type:  add on  Primary Provider:  Louie Casa, MD  CC:  CP  and PVC .  History of Present Illness: Patient is 70 years old and came for an unscheduled visit because of left shoulder and left arm pain and concern about starting a new drug for her breast cancer.  she is a patient of Dr. Rosalyn Charters.  She has documented nonobstructive disease at catheterization in 2006 with normal ejection fraction of 55%. She also has hypertension diabetes and hyperlipidemia.  Recently she's been involved with taking care of her new grandchild who is just several days old. She's been up all night for 2 or 3 nights. She developed some left shoulder and left arm pain which is not related to exertion. She also has had symptoms of fatigue.  She had recently diagnosed breast cancer. She had surgery in August and underwent chemotherapy from October 2 December and radiation therapy from January to February. Her oncologist who is in [place] has just started on her new drug to suppress estrogen which is Arimidex.    Current Medications (verified): 1)  Lipitor 20 Mg Tabs (Atorvastatin Calcium) .... Take One Tablet By Mouth Daily. 2)  Aspirin Ec 325 Mg Tbec (Aspirin) .... Take One Tablet By Mouth Daily 3)  Oscal 500/200 D-3 500-200 Mg-Unit Tabs (Calcium-Vitamin D) .... Take 1 Tablet By Mouth Three Times A Day 4)  Nexium 40 Mg Cpdr (Esomeprazole Magnesium) .... Take 1 Capsule By Mouth Once A Day At Bedtime 5)  Glucovance 5-500 Mg Tabs (Glyburide-Metformin) .... 2 Tabs Two Times A Day 6)  Levemir 100 Unit/ml Soln (Insulin Detemir) .... Prn 7)  Synthroid 150 Mcg Tabs (Levothyroxine Sodium) .... Take 1 Tablet By Mouth Once A Day 8)  Toprol Xl 50 Mg Xr24h-Tab (Metoprolol Succinate) .... Take 1 Tablet By Mouth Once A Day 9)  Singulair 10 Mg Tabs (Montelukast Sodium) .... Take 1 Tablet By Mouth Once A Day 10)  Altace 10 Mg Caps (Ramipril) .... Take 1 Capsule By Mouth Once A Day 11)   Maxzide-25 37.5-25 Mg Tabs (Triamterene-Hctz) .... Take 1 Tablet By Mouth Once A Day 12)  Xanax 0.5 Mg Tabs (Alprazolam) .... Take 1 Tablet By Mouth Once A Day 13)  Coq10 100 Mg Caps (Coenzyme Q10) .... Take 1 Capsule By Mouth Once A Day 14)  Cymbalta 60 Mg Cpep (Duloxetine Hcl) .... Take 1 Tablet By Mouth Once A Day 15)  Flonase 50 Mcg/act Susp (Fluticasone Propionate) .... Once A Day 16)  Trazodone Hcl 100 Mg Tabs (Trazodone Hcl) .... As Needed  Allergies (verified): 1)  ! Codeine 2)  ! Sulfa  Past History:  Past Medical History: Reviewed history from 04/27/2009 and no changes required. DEGENERATIVE DISC DISEASE (ICD-722.6) DEGENERATIVE JOINT DISEASE (ICD-715.90) FIBROMYALGIA (ICD-729.1) IRRITABLE BOWEL SYNDROME (ICD-564.1) HYPERTENSION, UNSPECIFIED (ICD-401.9) OBSTRUCTIVE SLEEP APNEA (ICD-327.23) DIABETES MELLITUS (ICD-250.00) HYPERCHOLESTEROLEMIA  IIA (ICD-272.0) CAD, UNSPECIFIED SITE (ICD-414.00) OVERWEIGHT/OBESITY (ICD-278.02) Hypothyroidism    Review of Systems       ROS is negative except as outlined in HPI.   Vital Signs:  Patient profile:   70 year old female Height:      65 inches Weight:      179 pounds BMI:     29.89 Pulse rate:   62 / minute BP sitting:   110 / 62  (right arm) Cuff size:   large  Vitals Entered By: Hardin Negus, RMA (December 09, 2009 10:20 AM)  Physical  Exam  Additional Exam:  Gen. Well-nourished, in no distress   Neck: No JVD, thyroid not enlarged, no carotid bruits Lungs: No tachypnea, clear without rales, rhonchi or wheezes Cardiovascular: Rhythm regular, PMI not displaced,  heart sounds  normal, no murmurs or gallops, no peripheral edema, pulses normal in all 4 extremities. Abdomen: BS normal, abdomen soft and non-tender without masses or organomegaly, no hepatosplenomegaly. MS: No deformities, no cyanosis or clubbing   Neuro:  No focal sns   Skin:  no lesions    Impression & Recommendations:  Problem # 1:  CHEST  PAIN-PRECORDIAL (ICD-786.51)  She has a history of nonobstructive coronary disease and she does have positive risk factors. Her pain is left shoulder and left arm pain which is not related to exercise. She has been up all night for a few nights with no sleep. I think her pain is very atypical for cardiac pain and I don't think it is related to her heart. Her cardiogram is normal. I recommended that she get some rest. We will plan further evaluation of her chest pain that she has recurrence. We'll have her see Dr. Riley Kill back in 6 weeks or sooner if she has recurrent problems. Her updated medication list for this problem includes:    Aspirin Ec 325 Mg Tbec (Aspirin) .Marland Kitchen... Take one tablet by mouth daily    Toprol Xl 50 Mg Xr24h-tab (Metoprolol succinate) .Marland Kitchen... Take 1 tablet by mouth once a day    Altace 10 Mg Caps (Ramipril) .Marland Kitchen... Take 1 capsule by mouth once a day  Her updated medication list for this problem includes:    Aspirin Ec 325 Mg Tbec (Aspirin) .Marland Kitchen... Take one tablet by mouth daily    Toprol Xl 50 Mg Xr24h-tab (Metoprolol succinate) .Marland Kitchen... Take 1 tablet by mouth once a day    Altace 10 Mg Caps (Ramipril) .Marland Kitchen... Take 1 capsule by mouth once a day  Problem # 2:  ADENOCARCINOMA, LEFT BREAST (ICD-174.9) She has cancer of the breast status post surgery, chemotherapy, and radiation therapy. I reviewed the potential side effects, contraindications, and drug interactions of Arimidex. I don't see any cardiac contraindication to beginning the drug. I encouraged her to go ahead and begin treatment.  Other Orders: EKG w/ Interpretation (93000)  Patient Instructions: 1)  Your physician recommends that you schedule a follow-up appointment in: 6 weeks with Dr. Riley Kill. Prescriptions: MAXZIDE-25 37.5-25 MG TABS (TRIAMTERENE-HCTZ) Take 1 tablet by mouth once a day  #90 x 3   Entered by:   Sherri Rad, RN, BSN   Authorized by:   Lenoria Farrier, MD, [facility]   Signed by:   Sherri Rad, RN, BSN on  12/09/2009   Method used:   Electronically to        [facility]* (mail-order)       [address]       [place], [place]  [address]       Ph: [phone]       Fax: [phone]   RxID:   [id number] ALTACE 10 MG CAPS (RAMIPRIL) Take 1 capsule by mouth once a day  #90 x 3   Entered by:   Sherri Rad, RN, BSN   Authorized by:   Lenoria Farrier, MD, [facility]   Signed by:   Sherri Rad, RN, BSN on 12/09/2009   Method used:   Electronically to        [facility]* (mail-order)       [address]       [place], [place]  [address]       Ph: [phone]       Fax: [phone]   RxID:   [id number] TOPROL XL 50 MG XR24H-TAB (METOPROLOL SUCCINATE) Take 1 tablet by mouth once a day  #90 x 3   Entered by:   Sherri Rad, RN, BSN   Authorized by:   Lenoria Farrier, MD, [facility]   Signed by:   Sherri Rad, RN, BSN on 12/09/2009   Method used:   Electronically to        [facility]* (mail-order)       [address]       [place], [place]  [address]       Ph: [phone]       Fax: [phone]   RxID:   [id number] NEXIUM 40 MG CPDR (ESOMEPRAZOLE MAGNESIUM) Take 1 capsule by mouth once a day at bedtime  #90 x 3   Entered by:   Sherri Rad, RN, BSN   Authorized by:   Lenoria Farrier, MD, [facility]   Signed by:   Sherri Rad, RN, BSN on 12/09/2009   Method used:   Electronically to        [facility]* (mail-order)       [address] [place]  [address]       Ph: [phone]       Fax: [phone]   RxID:   [id number] LIPITOR 20 MG TABS (ATORVASTATIN CALCIUM) Take one tablet by mouth daily.  #90 x 3   Entered by:   Sherri Rad, RN, BSN   Authorized by:   Lenoria Farrier, MD, [facility]   Signed by:   Sherri Rad, RN, BSN on 12/09/2009   Method used:   Electronically to        [facility]* (mail-order)       [address]        [place], [place]  [address]       Ph: [phone]       Fax: [phone]   RxID:   [id number]

## 2010-11-07 NOTE — Progress Notes (Signed)
Summary: QUESTION ABOUT A MEDICATION  Phone Note Call from Patient Call back at [phone]   Caller: Patient Summary of Call: PT HAVE QUESTION ABOUT MEDICATION THAT IS FOR CANCER. Initial call taken by: Judie Grieve,  December 08, 2009 2:24 PM  Follow-up for Phone Call        I spoke with the pt and she has finished radiation.  The pt was suppose to start Arimidex yesterday.  The pt has not started this medication because of the side effects.  The pt would like to know what Dr Riley Kill thinks about this medication. The pt said her first grandchild was born this weekend and she did not get any sleep over the weekend.  The pt said she had episodes of shoulder and left arm pain, and heaviness in chest  on Sunday and Monday.  The pt had chest pain and palpitations yesterday. Today the pt has had palpitations.  The pt thinks this could be related to not getting any rest this weekend.  I will discuss this pt with Dr Riley Kill. Follow-up by: Julieta Gutting, RN, BSN,  December 08, 2009 4:51 PM  Additional Follow-up for Phone Call Additional follow up Details #1::        I spoke with Dr Riley Kill and he does not know enough about Arimidex to say it this medication is safe.  He feels the pt should speak with her oncologist about the concerns with this medication.  Dr Riley Kill would also like the pt to be seen by the DOD on 12/09/09 to evaluate her symptoms.  I called the pt and she did not answer.  I did leave a detailed message on her voicemail with Dr Rosalyn Charters recommendations.   Additional Follow-up by: Julieta Gutting, RN, BSN,  December 08, 2009 7:02 PM    Additional Follow-up for Phone Call Additional follow up Details #2::    The pt called the office back today. She is coming at 10:00am today to see Dr. Juanda Chance (DOD).

## 2010-11-09 NOTE — Assessment & Plan Note (Addendum)
Summary: f9m    Visit Type:  6 months follow up Primary Vikrant Pryce:  Sherryll Burger  CC:  chest heavyness.  History of Present Illness: Patient left without being seen.  I was at the hospital helping with an emergency.  Will call patient. TS  Cardiovascular Risk History:      Positive major cardiovascular risk factors include female age 68 years old or older, diabetes, hyperlipidemia, and hypertension.  Negative major cardiovascular risk factors include non-tobacco-user status.        Positive history for target organ damage include ASHD (either angina; prior MI; prior CABG).    Lipid Management History:      Positive NCEP/ATP III risk factors include female age 52 years old or older, diabetes, HDL cholesterol less than 40, hypertension, and ASHD (either angina/prior MI/prior CABG).  Negative NCEP/ATP III risk factors include non-tobacco-user status.      Problems Prior to Update: 1)  Chest Pain-precordial  (ICD-786.51) 2)  Adenocarcinoma, Left Breast  (ICD-174.9) 3)  Chest Pain-precordial  (ICD-786.51) 4)  Unspecified Hypothyroidism  (ICD-244.9) 5)  Degenerative Disc Disease  (ICD-722.6) 6)  Degenerative Joint Disease  (ICD-715.90) 7)  Fibromyalgia  (ICD-729.1) 8)  Irritable Bowel Syndrome  (ICD-564.1) 9)  Hypertension, Unspecified  (ICD-401.9) 10)  Obstructive Sleep Apnea  (ICD-327.23) 11)  Diabetes Mellitus  (ICD-250.00) 12)  Hypercholesterolemia Iia  (ICD-272.0) 13)  Cad, Unspecified Site  (ICD-414.00) 14)  Overweight/obesity  (ICD-278.02)  Current Medications (verified): 1)  Lipitor 20 Mg Tabs (Atorvastatin Calcium) .... Take One Tablet By Mouth Daily. 2)  Aspirin Ec 325 Mg Tbec (Aspirin) .... Take One Tablet By Mouth Daily 3)  Oscal 500/200 D-3 500-200 Mg-Unit Tabs (Calcium-Vitamin D) .... Take 1 Tablet By Mouth Three Times A Day 4)  Nexium 40 Mg Cpdr (Esomeprazole Magnesium) .... Take 1 Capsule By Mouth Once A Day At Bedtime 5)  Glucovance 5-500 Mg Tabs (Glyburide-Metformin) ....  2 Tabs Two Times A Day 6)  Levemir 100 Unit/ml Soln (Insulin Detemir) .... Prn 7)  Synthroid 125 Mcg Tabs (Levothyroxine Sodium) .... Take One Tablet Daily 8)  Toprol Xl 50 Mg Xr24h-Tab (Metoprolol Succinate) .... Take 1 Tablet By Mouth Once A Day 9)  Singulair 10 Mg Tabs (Montelukast Sodium) .... Take 1 Tablet By Mouth Once A Day 10)  Altace 10 Mg Caps (Ramipril) .... Take 1 Capsule By Mouth Once A Day 11)  Maxzide-25 37.5-25 Mg Tabs (Triamterene-Hctz) .... Take 1 Tablet By Mouth Once A Day 12)  Xanax 0.5 Mg Tabs (Alprazolam) .... Take 1 Tablet By Mouth Once A Day 13)  Coq10 100 Mg Caps (Coenzyme Q10) .... Take 1 Capsule By Mouth Once A Day 14)  Cymbalta 60 Mg Cpep (Duloxetine Hcl) .... Take 1 Tablet By Mouth Once A Day 15)  Flonase 50 Mcg/act Susp (Fluticasone Propionate) .... Once A Day 16)  Trazodone Hcl 100 Mg Tabs (Trazodone Hcl) .... As Needed 17)  Aromasin 25 Mg Tabs (Exemestane) .... Take 1 Tablet By Mouth Once A Day  Allergies: 1)  ! Codeine 2)  ! Sulfa  Vital Signs:  Patient profile:   70 year old female Height:      65 inches Weight:      187 pounds BMI:     31.23 Pulse rate:   66 / minute Pulse rhythm:   regular Resp:     18 per minute BP sitting:   100 / 60  (left arm) Cuff size:   large  Vitals Entered By: Vikki Ports (September 29, 2010 9:42 AM)   Cardiovascular Risk Assessment/Plan:      The patient's hypertensive risk group is category C: Target organ damage and/or diabetes.  Today's blood pressure is 100/60.    Lipid Assessment/Plan:      Based on NCEP/ATP III, the patient's risk factor category is "history of coronary disease, peripheral vascular disease, cerebrovascular disease, or aortic aneurysm along with either diabetes, current smoker, or LDL > 130 plus HDL < 40 plus triglycerides > 200".  The patient's lipid goals are as follows: Total cholesterol goal is 200; LDL cholesterol goal is 70; HDL cholesterol goal is 40; Triglyceride goal is 150.

## 2010-12-05 ENCOUNTER — Ambulatory Visit: Payer: Self-pay | Admitting: Cardiology

## 2010-12-15 ENCOUNTER — Ambulatory Visit (INDEPENDENT_AMBULATORY_CARE_PROVIDER_SITE_OTHER): Payer: MEDICARE | Admitting: Cardiology

## 2010-12-15 ENCOUNTER — Encounter: Payer: Self-pay | Admitting: Cardiology

## 2010-12-15 DIAGNOSIS — R072 Precordial pain: Secondary | ICD-10-CM

## 2010-12-15 DIAGNOSIS — E7800 Pure hypercholesterolemia, unspecified: Secondary | ICD-10-CM

## 2011-01-04 NOTE — Assessment & Plan Note (Signed)
Summary: f108m    Visit Type:  Follow-up Primary Provider:  Sherryll Burger  CC:  Some chest pains.  History of Present Illness: Her sugars have been giving her a fit.  She is having to take more insulin, which is causing her to gain more weight.  She has occasional episodes of chest pain, but no more than it has ever been.  She hates the cancer medicine, impacting her memory and creating some issues.    Problems Prior to Update: 1)  Chest Pain-precordial  (ICD-786.51) 2)  Adenocarcinoma, Left Breast  (ICD-174.9) 3)  Chest Pain-precordial  (ICD-786.51) 4)  Unspecified Hypothyroidism  (ICD-244.9) 5)  Degenerative Disc Disease  (ICD-722.6) 6)  Degenerative Joint Disease  (ICD-715.90) 7)  Fibromyalgia  (ICD-729.1) 8)  Irritable Bowel Syndrome  (ICD-564.1) 9)  Hypertension, Unspecified  (ICD-401.9) 10)  Obstructive Sleep Apnea  (ICD-327.23) 11)  Diabetes Mellitus  (ICD-250.00) 12)  Hypercholesterolemia Iia  (ICD-272.0) 13)  Cad, Unspecified Site  (ICD-414.00) 14)  Overweight/obesity  (ICD-278.02)  Current Medications (verified): 1)  Lipitor 20 Mg Tabs (Atorvastatin Calcium) .... Take One Tablet By Mouth Daily. 2)  Aspirin Ec 325 Mg Tbec (Aspirin) .... Take One Tablet By Mouth Daily 3)  Oscal 500/200 D-3 500-200 Mg-Unit Tabs (Calcium-Vitamin D) .... Take 1 Tablet By Mouth Three Times A Day 4)  Nexium 40 Mg Cpdr (Esomeprazole Magnesium) .... Take 1 Capsule By Mouth Once A Day At Bedtime 5)  Glucovance 5-500 Mg Tabs (Glyburide-Metformin) .... 2 Tabs Two Times A Day 6)  Levemir 100 Unit/ml Soln (Insulin Detemir) .... Prn 7)  Synthroid 112 Mcg Tabs (Levothyroxine Sodium) .... Take 1 Tablet By Mouth Once A Day 8)  Toprol Xl 50 Mg Xr24h-Tab (Metoprolol Succinate) .... Take 1 Tablet By Mouth Once A Day 9)  Singulair 10 Mg Tabs (Montelukast Sodium) .... Take 1 Tablet By Mouth Once A Day 10)  Altace 10 Mg Caps (Ramipril) .... Take 1 Capsule By Mouth Once A Day 11)  Maxzide-25 37.5-25 Mg Tabs  (Triamterene-Hctz) .... Take 1 Tablet By Mouth Once A Day 12)  Xanax 0.5 Mg Tabs (Alprazolam) .... Take 1 Tablet By Mouth Once A Day 13)  Coq10 100 Mg Caps (Coenzyme Q10) .... Take 1 Capsule By Mouth Once A Day 14)  Cymbalta 60 Mg Cpep (Duloxetine Hcl) .... Take 1 Tablet By Mouth Once A Day 15)  Flonase 50 Mcg/act Susp (Fluticasone Propionate) .... Once A Day 16)  Aromasin 25 Mg Tabs (Exemestane) .... Take 1 Tablet By Mouth Once A Day 17)  Flonase 50 Mcg/act Susp (Fluticasone Propionate) .Marland Kitchen.. 1 Puffs Daily  Allergies: 1)  ! Codeine 2)  ! Sulfa  Past History:  Past Medical History: Last updated: 04/27/2009 DEGENERATIVE DISC DISEASE (ICD-722.6) DEGENERATIVE JOINT DISEASE (ICD-715.90) FIBROMYALGIA (ICD-729.1) IRRITABLE BOWEL SYNDROME (ICD-564.1) HYPERTENSION, UNSPECIFIED (ICD-401.9) OBSTRUCTIVE SLEEP APNEA (ICD-327.23) DIABETES MELLITUS (ICD-250.00) HYPERCHOLESTEROLEMIA  IIA (ICD-272.0) CAD, UNSPECIFIED SITE (ICD-414.00) OVERWEIGHT/OBESITY (ICD-278.02) Hypothyroidism    Past Surgical History: Last updated: 01/24/2010 Abdominal Hysterectomy-Total Cholecystectomy C-section resection of a pelvic mass breast cancer resection as lumpectomy.    Family History: Last updated: 04/07/2009 Family History of Cancer:  Family History of Coronary Artery Disease:   Social History: Last updated: 04/07/2009 Retired  Disabled  Married  Tobacco Use - No.  Alcohol Use - no  Risk Factors: Smoking Status: never (04/07/2009)  Vital Signs:  Patient profile:   70 year old female Height:      65 inches Weight:      186.50 pounds BMI:  31.15 Pulse rate:   66 / minute Pulse rhythm:   regular Resp:     18 per minute BP sitting:   112 / 64  (right arm) Cuff size:   large  Vitals Entered By: Vikki Ports (December 15, 2010 11:30 AM)  Physical Exam  General:  Well developed, well nourished, in no acute distress. Head:  normocephalic and atraumatic Eyes:  PERRLA/EOM intact;  conjunctiva and lids normal. Lungs:  Clear bilaterally to auscultation and percussion. Heart:  PMI non displaced.  Normal S1 and S2.  No murmur or rub.  No gallop. Abdomen:  Bowel sounds positive; abdomen soft and non-tender without masses, organomegaly, or hernias noted. No hepatosplenomegaly. Extremities:  No clubbing or cyanosis. Neurologic:  Alert and oriented x 3.   EKG  Procedure date:  12/15/2010  Findings:      NSR.  WNL.  Impression & Recommendations:  Problem # 1:  CHEST PAIN-PRECORDIAL (ICD-786.51)  seems stable at present.  No advance.  Has DM which is not well controlled.  Continue medical therapy. Her updated medication list for this problem includes:    Aspirin Ec 325 Mg Tbec (Aspirin) .Marland Kitchen... Take one tablet by mouth daily    Toprol Xl 50 Mg Xr24h-tab (Metoprolol succinate) .Marland Kitchen... Take 1 tablet by mouth once a day    Altace 10 Mg Caps (Ramipril) .Marland Kitchen... Take 1 capsule by mouth once a day  Orders: EKG w/ Interpretation (93000)  Problem # 2:  HYPERCHOLESTEROLEMIA  IIA (ICD-272.0)  at target. Her updated medication list for this problem includes:    Lipitor 20 Mg Tabs (Atorvastatin calcium) .Marland Kitchen... Take one tablet by mouth daily.  Her updated medication list for this problem includes:    Lipitor 20 Mg Tabs (Atorvastatin calcium) .Marland Kitchen... Take one tablet by mouth daily.  Orders: EKG w/ Interpretation (93000)  Patient Instructions: 1)  Your physician recommends that you continue on your current medications as directed. Please refer to the Current Medication list given to you today. 2)  Your physician wants you to follow-up in: 1 YEAR.  You will receive a reminder letter in the mail two months in advance. If you don't receive a letter, please call our office to schedule the follow-up appointment.

## 2011-02-20 NOTE — Assessment & Plan Note (Signed)
[facility]                         GASTROENTEROLOGY OFFICE NOTE   NAME:[name], [name]                       MRN:          [id number]  DATE:09/23/2007                            DOB:          29-Aug-1941    Jeanne Kim has a long history of intestinal adhesions with gas, bloating and  constipation. She apparently as per my previous note of August 05, 2007  had lysis of adhesions at [facility]. She had good response  recently to a ten day course of Xyfaxin 400 mg t.i.d. along with Align  probiotic therapy and she is taking nocturnal erythromycin 200 mg. She  feels like she needs another treatment at this time and I have renewed  her ten day bacterial overgrowth syndrome treatment while continuing her  other medications which are multiple and are listed and reviewed in her  chart. It is of note that she did have a celiac panel that was normal.  She saw Dr. Riley Kill because of her shortness of breath and had a 2D  echocardiogram which was felt not to be consistent with any cardiac  source for shortness of breath.   Because of her probable bacterial overgrowth syndrome, I have sent her  by the lab to check followup CBC and anemia profile including B12 level.  I will continue to follow her on a p.r.n. basis as needed.     Jeanne Kim. Jeanne Motto, MD, [facility], FAGA  Electronically Signed    DRP/MedQ  DD: 09/23/2007  DT: 09/23/2007  Job #: [id number]

## 2011-02-20 NOTE — Assessment & Plan Note (Signed)
[facility]                            CARDIOLOGY OFFICE NOTE   NAME:Snedden, [name]                       MRN:          [id number]  DATE:06/02/2007                            DOB:          Jun 13, 1941    Jeanne Kim is in for followup.  She has been having a little bit more chest  discomfort, but she has been under quite a bit of stress.  Her son is  getting married, and there are some other issues which she did not  elaborate on today.  Overall, however, she has been stable.  On  examination today, the blood pressure is 115/60 by me, the pulse is 67,  the lung fields are absolutely clear.  The carotid upstrokes are brisk.  Without any problem, the lung fields are clear to auscultation and  percussion.   Overall, Nadalee is stable.  She thinks this is mostly related to stress.  Her last catheterization was done in 2006 and was largely unchanged from  the previous study.  She has remained on lipid-lowering agents ever  since.  As a result, we elected not to do any further studies on her  today.  I plan to see her back in followup, in about 2 to 3 months, but  she knows to call us should any changes occur.  We will start her on  some Imdur today at 15 mg daily.     Arturo Morton. Riley Kill, MD, [facility]  Electronically Signed    TDS/MedQ  DD: 06/02/2007  DT: 06/03/2007  Job #: [id number]

## 2011-02-20 NOTE — Assessment & Plan Note (Signed)
[facility]                         GASTROENTEROLOGY OFFICE NOTE   NAME:Jeanne Kim, Jeanne Kim                       MRN:          [id number]  DATE:08/05/2007                            DOB:          1940/10/26    The patient is a middle-aged white female with poorly controlled  diabetes who is having her medications adjusted by Dr. Sherryll Burger.  I have  been seeing her for many years because of associated GI motility  disorders with constipation predominant IBS, suspected gastroparesis,  and chronic gastroesophageal reflux disease.  She has been on all types  of prokinetic agents in the past including Propulsid, Metoclopramide,  erythromycin, Zelnorm, and Amitiza, none of which have helped any of her  constipation, gas, or bloating problems.  The only response she has had  over the years are recurrent treatments for bacterial overgrowth  syndrome with appropriate antibiotics.  She last was seen approximately  a year ago and did not respond to a 10-day course of Xifaxan along with  Align probiotic therapy.   She again has abdominal gas, bloating, and distention with rather marked  constipation despite using several types of laxatives.  She also  complains of early satiety with worsening reflux, but has had no  anorexia, weight loss, or any specific hepatobiliary complaints.  She is  status post cholecystectomy.  She has also had a long history of  recurrent abdominal operations and has had adhesions with apparently  surgery at [facility] years ago for lysis of adhesions.  A year  ago, I obtained a small bowel series which was normal.  She has been  checked in the past with endoscopic exams for H. Pylori infection and  this has been negative.   PAST MEDICAL HISTORY:  The patient has multiple medical problems in  addition to her diabetes including fibromyalgia, hypertension, chronic  anxiety and depression, sleep apnea, recurrent kidney stones, multiple  surgical procedures on her pelvis.  She is followed by several  physicians with Arturo Morton. Riley Kill, MD, [facility] seeing her for atypical  chest pain with apparently a negative cardiac catheterization on several  occasions.  He felt that a lot of her problems are related to anxiety  and stress.   MEDICATIONS:  She is on a variety of medications including listed in her  chart:  1. Nexium 40 mg at bedtime.  2. She has had recent increase in her diabetic medicines from      Glucovance 5/50 one twice a day to two twice a day.   LABORATORY DATA:  Blood glucose 295 with normal renal function,  electrolyte panel, and CBC.  TSH level was normal.   PHYSICAL EXAMINATION:  GENERAL:  She is an attractive appearing white  female in no acute distress, appearing her stated age.  I cannot  appreciate stigmata of chronic liver disease.  VITAL SIGNS:  She weighs 196 pounds.  Blood pressure 118/76, pulse 76  and regular.  ABDOMEN:  No significant distention, organomegaly, masses, or  tenderness.  Bowel sounds were normal.   ASSESSMENT:  I think the patient most likely has diabetic gastroparesis  and also generalized gut atony manifested by a rather severe chronic  constipation.  She has a past history of lysis of intestinal adhesions,  but recent small bowel series was normal.  She has responded well in the  past to treatment for bacterial overgrowth syndrome.  Last CT scan of  the abdomen was a year ago and showed no specific abnormalities except  chronic constipation changes.   RECOMMENDATIONS:  1. Continue MiraLax and laxative use as needed.  2. Schedule technetium gastric emptying scan.  3. The patient will need Sitz marker studies.  4. Repeat treatment with Xifaxan 400 mg t.i.d. for 10 days along with      Align probiotic therapy.  5. Continue reflux regime and daily PPI therapy.  6. Consider referral to [facility] for further evaluation and      treatment of her motility disturbance and  perhaps external gastric      pacing would be in order.  7. Continue other medications per Dr. Sherryll Burger.   ADDENDUM:  The patient has also in the past been treated with  erythromycin at bedtime but she did not take this for any extended  period of time.  This was to enhance her migraine and motor complexes  and hopefully prevent bacterial overgrowth recurrence, and obviously was  somewhat successful since I have not seen her in the past year.  We may  want to try a higher dose of erythromycin such as 200 mg twice a day in  the future depending on clinical course.     Vania Rea. Jarold Motto, MD, [facility], FAGA  Electronically Signed    DRP/MedQ  DD: 08/05/2007  DT: 08/05/2007  Job #: [id number]   cc:   [name], MD

## 2011-02-20 NOTE — Assessment & Plan Note (Signed)
[facility]                            CARDIOLOGY OFFICE NOTE   NAME:Portugal, EDYN POPOCA                       MRN:          [id number]  DATE:06/09/2008                            DOB:          September 24, 1941    Jeanne Kim is in for a followup visit.  Cardiac wise, she has been stable.  Hadasa underwent a thyroidectomy.  She was seen by an endocrinologist,  and noted to have a thyroid nodule.  This was checked and she  subsequently had a full thyroidectomy.  Since that time, she had been  placed on Synthroid at a nominal dose of 137 mcg per day.  Importantly,  she has had some intermittent chest discomfort that __________  associated with nitroglycerin.  She has had perhaps a little bit more  recently, but it is in a pattern that does not necessarily daily  progress of her exertional.  It is similar to what she has had in the  past.  She has had a slight degree of lower extremity edema.   CURRENT MEDICATIONS:  1. Lipitor 10 mg daily.  2. Altace 10 mg daily.  3. Maxzide 25/37.5 daily.  4. Nexium 40 mg nightly.  5. K-Dur 20 mEq b.i.d.  6. Cymbalta 30 mg daily.  7. Flonase 2 sprays qhs.  8. Singulair 1 nightly.  9. Toprol 50 mg daily.  10.Aspirin 81 mg daily.  11.Caltrate daily.  12.CPAP.  13.Glucovance 5/500 two tablets b.i.d.  14.Imdur 15 mg daily.  15.Actos 30 mg daily.  16.Synthroid 137 mcg daily.  17.Enteric-coated aspirin 325 mg daily.   PHYSICAL EXAMINATION:  GENERAL:  She is alert and oriented.  VITAL SIGNS:  The blood pressure is 104/60, the pulse is 67.  LUNGS:  Clear.  CARDIAC:  Rhythm is regular.   Electrocardiogram demonstrates normal sinus rhythm essentially within  normal limits.   Overall, I am concerned about her current condition.  However, the  symptoms are somewhat similar to what she has had in the past.  She last  underwent catheterization about 3-1/2 years ago and at that time, did  not have a critical disease.  She had a 30-40%  at most mid-LAD stenosis.  She did not do well on insulin, having gained some weight, and she is  back on her oral medications.  I have suggested that we see her back in  followup in fairly short order, but not  proceed to cardiac catheterization at the present time.  If she would  have any increase in her symptomatology at all, she is to give [place] a  call, and we will see her promptly.     Arturo Morton. Riley Kill, MD, [facility]  Electronically Signed    TDS/MedQ  DD: 06/09/2008  DT: 06/10/2008  Job #: [id number]

## 2011-02-20 NOTE — Assessment & Plan Note (Signed)
[facility]                            CARDIOLOGY OFFICE NOTE   NAME:Jeanne Kim, Jeanne Kim                       MRN:          [id number]  DATE:12/08/2008                            DOB:          24-Apr-1941    Jeanne Kim is in for a followup visit.  She actually has pretty stable.  She  continues to have some episodes of some chest discomfort as well as  shortness of breath.  However, this has not been any which she has had  in the past.  She was last cathed in 2006.  At that time, she had well-  preserved LV function.  No evidence of aortic regurgitation dissection  and a 30-40% mid LAD stenosis, but no other significant obstructions.  When seen back in the fall, she did have hypothyroidism.  Her hemoglobin  A1c was elevated.  Her weight in the interim has remained stable.  She  is continued to have some stress related to managing her kids at home.   CURRENT MEDICATIONS:  1. CPAP at night.  2. Glucovance 5/500 two tablets b.i.d.  3. Imdur 15 mg daily.  4. Xifaxan p.r.n.  5. Toprol 50 mg daily.  6. Singulair 1 daily.  7. Flonase 2 puffs nightly.  8. K-Dur 20 mEq b.i.d.  9. Nexium 40 mg nightly.  10.Altace 10 mg daily.  11.She also was taking Lipitor 20 mg daily.  12.Synthroid 150 mcg daily.  13.Triamterene HCTZ 25/37.5 daily.  14.Omega-3.  15.Multivitamin.  16.Premarin cream on Monday and Thursday.  17.Estradiol 0.075 patch.  18.Cymbalta 60 mg daily.  19.CoQ10.  20.Enteric-coated aspirin 325 mg daily.   PHYSICAL EXAMINATION:  GENERAL:  She is alert and oriented in no  distress.  VITAL SIGNS:  The blood pressure is currently 100/64, pulse is 68.  CARDIAC:  Rhythm is regular.  EXTREMITIES:  No edema.   EKG reveals sinus rhythm essentially within normal limits.   We will continue to manage [facility] conservatively.  If there is an  increase in symptoms, she knows to call us promptly.  At some point, we  may need to repeat her cardiac catheterization,  but her symptoms have been relatively stable over long period of time.  A BMET and lipid profile would probably also be fairly appropriate at  this point in time.     Arturo Morton. Riley Kill, MD, [facility]  Electronically Signed    TDS/MedQ  DD: 12/19/2008  DT: 12/20/2008  Job #: [id number]

## 2011-02-20 NOTE — Assessment & Plan Note (Signed)
[facility]                            CARDIOLOGY OFFICE NOTE   NAME:Folta, DAUNE DIVIRGILIO                       MRN:          [id number]  DATE:07/13/2008                            DOB:          22-Mar-1941    Nadea is in for followup.  She had been under tremendous amount of  stress, much of it is financial, and/or related to her kids.  She has  also been feeling quite fatigued even though her TSH has been told to be  normal.  We discussed this at some length today.   Today on examination, the blood pressure is 120/60, the pulse is 68.  The lung fields are clear, and the cardiac rhythm is regular.  I do not  appreciate a significant murmur.   The electrocardiogram demonstrates normal sinus rhythm essentially  within normal limits.   Skilynn overall is doing relatively well.  She has got intermittent chest  discomfort which she thinks mostly is related to stress.  If she notices  change in pattern, she will notify me promptly.  She had an extensive  discussion about the evaluation of chest pain today, and in her  particular case which seems to occur rather intermittently.  She does  seem to think that much of it is related to stress, although if there is  a change in pattern I would have a fairly low threshold for repeat  cardiac cath.  We will see her back in 3 months' time and get a basic  metabolic profile today.     Arturo Morton. Riley Kill, MD, [facility]  Electronically Signed    TDS/MedQ  DD: 07/13/2008  DT: 07/14/2008  Job #: [id number]

## 2011-02-20 NOTE — Assessment & Plan Note (Signed)
[facility]                            CARDIOLOGY OFFICE NOTE   NAME:Groh, BERNADINE MELECIO                       MRN:          [id number]  DATE:09/02/2007                            DOB:          04/05/1941    Genessa is in for followup.  Her biggest complaint right now has been more  recent shortness of breath.  She has also had her chest pain, which is  largely unchanged but she continues to have some palpitations.  The  exact etiology of this is unclear.  She has been seeing an  endocrinologist who has been doing a fair number of laboratory studies  on her.  Importantly, the patient has had some very dry skin and has had  a little bit of hair loss.   EXAMINATION:  Blood pressure is 108/60, and the pulse is 77.  The lung fields are clear.  There is no prolonged expiration.  Importantly, there is no obvious jugular venous distension.  The PMI is nondisplaced.  There is a normal first and second heart  sound.  The P2 does not sound increased and I cannot appreciate a  diastolic murmur.  The extremities reveal no edema.  There is no hepatojugular reflux.  Also, no obvious increase in liver  size.  SKIN:  Demonstrates some very dry skin.  She almost has a masked facies appearance.  The joints do not appear to  be stiff, however.   Her EKG reveals normal sinus rhythm and is within normal limits.   The patient previously had an echocardiogram in 2006.  At that time, she  did not have RV enlargement.  We will get a D-dimer and an ANA.  I will  also get a chest x-ray.  I am going to repeat her 2D echo.  I doubt that  she has lupus, but certainly I think she clearly has some skin changes.  I have asked her to keep [place] informed as to the workup that she is having  presently, as we have not received any notes.  We will check these  things and I will get back to her about them.     Arturo Morton. Riley Kill, MD, [facility]  Electronically Signed    TDS/MedQ  DD:  09/02/2007  DT: 09/02/2007  Job #: [id number]

## 2011-02-20 NOTE — Assessment & Plan Note (Signed)
[facility]                            CARDIOLOGY OFFICE NOTE   NAME:Cake, ATARAH CADOGAN                       MRN:          [id number]  DATE:04/25/2007                            DOB:          14-Dec-1940    Jeanne Kim is in for a followup visit.  She has had chest pain a couple of  times, but it has not been frequent.  She does get somewhat short of  breath when she walks up and down the stairs.  She says that there has  been a lot of things going on, and she has been under a lot of stress.  The exact cause of the stress is unclear.  Unfortunately, her primary  care doctor, Dr. [name] Legato, passed away recently.   MEDICATIONS:  1. Caltrate 1 daily.  2. Enteric-coated aspirin 325 mg daily.  3. CPAP q.h.s.  4. Flonase 2 sprays daily.  5. Maxzide 37.5/25 daily.  6. K-Dur 20 mEq daily.  7. Glucovance 500/2.5, 2 p.o. b.i.d.  8. Lipitor 5 mg daily.  9. Altace 10 mg daily.  10.Nexium 40 mg daily.  11.Singulair 1 daily.  12.Toprol XL 50 mg daily.  13.Cymbalta 60 mg 1 tab daily.   PHYSICAL EXAMINATION:  She is alert and oriented.  Abigayle's weight has  gone up from 175 to 189 over the past year or so.  The blood pressure is 106/66.  The pulse is 71.  The lung fields are clear.  The cardiac rhythm is regular.   IMPRESSION:  1. Coronary artery disease with last cardiac catheterization in      January 2006, demonstrating 30-40% left anterior descending      stenosis with no other critical disease and well preserved left      ventricular function.  2. History of intermittent chest discomfort.  3. Noninsulin-dependent diabetes mellitus.  4. Recent stress.  5. Obstructive sleep apnea on CPAP.  6. Hypercholesterolemia.   PLAN:  1. Continue current medical regimen.  2. Basic metabolic profile.  3. Return to clinic in 3 months.     Arturo Morton. Riley Kill, MD, [facility]  Electronically Signed    TDS/MedQ  DD: 05/10/2007  DT: 05/10/2007  Job #: [id number]

## 2011-02-20 NOTE — Assessment & Plan Note (Signed)
[facility]                            CARDIOLOGY OFFICE NOTE   NAME:[name], [name]                       MRN:          [id number]  DATE:01/14/2008                            DOB:          1940-12-08    Jeanne Kim is in for follow-up.  Overall, there has been really no change in  her overall situation.  She is under tremendous amount of stress.  She  has given her children a fair amount of money, and it is involved in  their house.  Several of them have lost their jobs.  They are having  difficulty with the housing market.  She has had some swelling in the  lower extremities.  She has more recently has been started on Actos.   On physical today, the weight is 200 pounds, up 5 pounds.  Blood  pressure 128/50, pulse 76.  LUNG:  Fields are clear without rales.  There is extremity edema that is 1 to 2 plus.  There is a minimal systolic ejection murmur.   [name] has some intermittent chest pain, but a huge amount of stress.  She has been gaining weight on Actos.  Dr. Sherryll Burger has told her that he is  going to send her to an endocrinologist.  The Actos itself may need to  be adjusted and may be responsible for the volume overload.  Clinically,  we will not make any changes otherwise.  I will see her back in follow-  up in four months.     Arturo Morton. Riley Kill, MD, [facility]  Electronically Signed    TDS/MedQ  DD: 01/14/2008  DT: 01/14/2008  Job #: [id number]   cc:   [name], MD

## 2011-02-23 NOTE — Discharge Summary (Signed)
Jeanne Kim, Jeanne Kim                          ACCOUNT NO.:  [id number]   MEDICAL RECORD NO.:  [id number]                   PATIENT TYPE:  INP   LOCATION:  3013                                 FACILITY:  [facility]   PHYSICIAN:  Payton Doughty, M.D.                   DATE OF BIRTH:  04-15-1941   DATE OF ADMISSION:  11/16/2003  DATE OF DISCHARGE:  11/19/2003                                 DISCHARGE SUMMARY   ADMITTING DIAGNOSIS:  Cervical spondylosis without myelopathy.   DISCHARGE DIAGNOSIS:  Cervical spondylosis without myelopathy.   OPERATIVE PROCEDURE:  C4-5, C5-6, C6-7 intracervical decompression and  fusion with a __________ plate.   COMPLICATIONS:  None.   DISCHARGE STATUS:  Alive and well.   BODY OF TEXT:  This is a 70 year old right-handed white lady whose history  and physical is recounted on the chart. She has cervical spondylosis with  secondary cord compression.  She has a relatively benign medical history  with a small amount of hypertension, adult onset diabetes and a bit of  depression.  General exam is intact.  Neurologic exam showed intact strength  with pain into her arm.  She has grips that are diminished bilaterally at  4/5.  MR demonstrated significant stenosis and cord compression.  She was  admitted after ascertaining normal laboratory values and underwent anterior  cervical compression and fusion C4-5, C5-6, and C6-C7.   Postoperatively she has done relatively well.  Kept on a PCA for 2 days. It  was stopped yesterday.  She is up on oral Percocet eating and voiding  normally.  She had some issues wanting more pain medicine and told her that  she was on adequate pain control and she seemed to understand this. Her  strength is full at this time.  Her airway is in good shape.  She is being  discharged home to the care of her family.  The follow up should be in the  [facility] office in about 10 days.              Payton Doughty, M.D.    MWR/MEDQ  D:  11/19/2003  T:  11/20/2003  Job:  [id number]

## 2011-02-23 NOTE — Op Note (Signed)
[facility]  Patient:    Jeanne Kim, Jeanne Kim                       MRN: [id number] Proc. Date: 05/02/01 Adm. Date:  [id number] Attending:  Dalbert Mayotte                           Operative Report  PREOPERATIVE DIAGNOSES: 1. Recurrent urinary tract infections and hemorrhagic cystitis.  Preoperative    normal IVP except for some small renal calcifications. 2. Extracorporeal shock wave lithotripsy 1987, complicated by urosepsis, left. 3. Mild renal calcifications dated to 1979. 4. Hysterectomy 1985. 5. Diabetes mellitus. 6. Right ovarian cystectomy, February 2002. 7. Sleep apnea. 8. Aortic regurgitation.  POSTOPERATIVE DIAGNOSES: 1. Recurrent urinary tract infections and hemorrhagic cystitis.  Preoperative    normal IVP except for some small renal calcifications. 2. Extracorporeal shock wave lithotripsy 1987, complicated by urosepsis, left. 3. Mild renal calcifications dated to 1979. 4. Hysterectomy 1985. 5. Diabetes mellitus. 6. Right ovarian cystectomy, February 2002. 7. Sleep apnea. 8. Aortic regurgitation. 9. Urethral stricture.  OPERATION PERFORMED:  Cystoscopy, urethral dilation.  DESCRIPTION OF OPERATION:  This patient, age 70, was brought to the operating room and underwent successful induction of IV sedation, was prepped and draped in the lithotomy position, and had cystoscopy carried out with the #22 cystourethroscope using 70 and 12 degree lenses.  The patient had a mild urethral stricture, but no stone, tumor, or ulcer was noted in the bladder. There was only mild hyperemia and erythema.  The right and left ureteral orifices were normal.  The urethra dilated with some difficulty to 38 [place]. There was no excessive bleeding.  The patient was examined under anesthesia. No masses were noted.  The uterus was absent.  Rectal tone was good.  PLAN:  The patient will go home with approval with anesthesiology.  She will have Cipro 500  b.i.d. for pain.  She will continue her regular medications which are Altace, Glucovance, HCTZ, Zyrtec, Celexa, Premarin, Ambien, Imdur, and ______ .  She may restart her aspirin when her urine is clear.  She will return to the office in 1-2 weeks. DD:  05/02/01 TD:  05/03/01 Job: [id number]

## 2011-02-23 NOTE — Discharge Summary (Signed)
Jeanne Kim, Jeanne Kim                ACCOUNT NO.:  [id number]   MEDICAL RECORD NO.:  [id number]          PATIENT TYPE:  INP   LOCATION:  1827                         FACILITY:  [facility]   PHYSICIAN:  Learta Codding, M.D. LHCDATE OF BIRTH:  Oct 29, 1940   DATE OF ADMISSION:  10/05/2004  DATE OF DISCHARGE:  10/06/2004                                 DISCHARGE SUMMARY   REASON FOR ADMISSION:  Ms. [name] is a 70 year old female, with complex past  medical history, and cardiac history notable for non-obstructive coronary  artery disease, ? coronary vasospasm by previous cardiac catheterization,  who presented to the emergency room with chest pain.  Please refer to  admission note for full details.   LABORATORY DATA:  Normal CBC.  D-dimer is 0.38.  Potassium 3.2, BUN 14,  creatinine 0.6.  Liver enzymes normal.  CPK 89/4.3; troponin I 0.01.   HOSPITAL COURSE:  The patient was initially admitted for further evaluation  of chest pain.  Her symptoms, however, were felt to be very atypical for  ischemia and plan was to maintain for overnight observation and to discharge  home the following day if serial cardiac markers were negative.   However, the patient left early the morning of October 06, 2005, against  medical advise.   DISCHARGE DIAGNOSES:  1.  Atypical chest pain.      1.  Patient left against medical advise.  2.  History of nonobstructive coronary artery disease.      1.  Status post cardiac catheterization in 2000.      2.  Nonischemic Cardiolite, April of 2005.  3.  Fibromyalgia.      GS/MEDQ  D:  02/23/2005  T:  02/24/2005  Job:  [id number]

## 2011-02-23 NOTE — Assessment & Plan Note (Signed)
[facility]                           GASTROENTEROLOGY OFFICE NOTE   NAME:[name], [name]                       MRN:          [id number]  DATE:05/24/2006                            DOB:          05-28-1941    REASON FOR VISIT:  Jeanne Kim continues to do well on Xifaxan because of her  constipation and gas and bloating, but when she finishes her antibiotics,  her symptoms return.  She is taking twice a day Amitiza 24 mcg.  A small  bowel x-ray shows no evidence of small-bowel obstruction or kinking.  Otherwise, she is doing fairly well.  Her vital signs are all stable.   ASSESSMENT:  Jeanne Kim has gut atony with chronic constipation, also noted  adhesions, and probable bacterial overgrowth syndrome.   RECOMMENDATIONS:  I have advised Jeanne Kim to take Xifaxan 400 mg t.i.d. for the  last 10 days of each month, with Align probiotic therapy during that time.  I will also put her on a dose of E-Mycin 50 to 100 mg on a regular basis at  bedtime to try to keep her intestinal housecleaning in order.  She is to  continue twice a day Amitiza and check with me in three to four months'  time.                                   Vania Rea. Jarold Motto, MD, [facility], [place]   DRP/MedQ  DD:  05/24/2006  DT:  05/24/2006  Job #:  [id number]   cc:   Jeanne Kim, M.D.

## 2011-02-23 NOTE — H&P (Signed)
NAMESABRE, ROMBERGER                ACCOUNT NO.:  [id number]   MEDICAL RECORD NO.:  [id number]          PATIENT TYPE:  EMS   LOCATION:  MAJO                         FACILITY:  [facility]   PHYSICIAN:  Jeanne Kim, M.D. LHCDATE OF BIRTH:  [birth year]   DATE OF ADMISSION:  10/05/2004  DATE OF DISCHARGE:                                HISTORY & PHYSICAL   PHYSICIANS:  Primary cardiologist:  Jeanne Kim. Jeanne Kim, M.D.  Primary care physician:  Dr. Orvan Kim.   CHIEF COMPLAINT:  Chest pain.   HISTORY OF PRESENT ILLNESS:  Ms. Jeanne Kim is a very pleasant 70 year old white  female with a history of nonobstructive coronary artery disease by  catheterization with question of coronary vasospasm, nonischemic Cardiolite  in April 2005, type 2 diabetes mellitus, hypertension, hyperlipidemia, GERD,  degenerative joint disease, degenerative disk disease, fibromyalgia, who  presents to the [facility] Emergency Room today with complaints of chest pain.  She notes chest pressure starting yesterday after climbing up steps in her  home.  This lasted for about 45 minutes.  She took 3 sublingual  nitroglycerin.  The nitroglycerin really did not help her chest pain, and  the pain went away on its own.  She had another episode of chest pain later  in the day that occurred at rest.  Her pain again occurred this morning at  rest.  She has really not done anything exertional since her chest pain  started yesterday.  She called the office today and was instructed to go to  the emergency room.  She did note diaphoresis at one point with her chest  discomfort.  She also noted some shortness of breath at one point of her  chest discomfort.  She denies any syncope or presyncope, orthopnea,  paroxysmal nocturnal dyspnea, palpitations, or lower extremity edema.  Currently her pain level is 1/10.   PAST MEDICAL HISTORY:  1.  Diabetes mellitus, type 2.  2.  Hypertension.  3.  Hyperlipidemia.  4.  GERD.  5.  Sleep apnea on CPAP.  6.  Irritable bowel syndrome.  7.  Fibromyalgia.  8.  Degenerative joint disease.  9.  Degenerative disk disease.  10. Status post cervical spine surgeries.  11. Nephrolithiasis.  12. Status post hysterectomy.  13. Status post C section.  14. Status post cholecystectomy.  15. Status post resection of a pelvic mass.  16. Cardiac catheterization in 2000 revealing 20% ostial left main stenosis,      20 to 30% LAD stenosis.  17. She had a Cardiolite January 12, 2004, that showed no ischemia at 74%.  18. She had an echocardiogram in April 2002 that showed mild aortic      insufficiency, good LV function.   ALLERGIES:  CODEINE.   MEDICATIONS:  1.  Altace 10 mg daily.  2.  Lipitor 10 mg q.h.s.  3.  HCTZ 25 mg daily.  4.  Glucovance 5/500 mg 2 tablets b.i.d.  5.  Singulair 10 mg daily.  6.  Ambien q.h.s.  7.  Nexium 40 mg daily.  8.  Zelnorm daily.  9.  Aspirin 325 mg  daily.  10. Celebrex daily.  This was recently started about two to three weeks ago      for hip pain.   SOCIAL HISTORY:  The patient lives in [place].  She is a retired Engineer, civil (consulting).  She is  also disabled secondary to osteoporosis and fibromyalgia.  She is married  and has four children.  She denies alcohol or tobacco abuse.   FAMILY HISTORY:  Significant for coronary artery disease in her mother.  Her  mother is still alive at age 81.  Her father recently died from lung cancer  at age 86.  She has been under a lot of stress secondary to his death.   REVIEW OF SYSTEMS:  Please see HPI.  She denies any fevers, chills, melena,  hematochezia, dysuria, cough.  The rest of the Review of Systems are  negative.   PHYSICAL EXAMINATION:  GENERAL:  Well-developed, well-nourished female in no  acute distress.  VITAL SIGNS:  Blood pressure 116/71, pulse 79, respirations 20, oxygen  saturation 98% on room air.  Temperature 98.1.  HEENT:  Head:  Normocephalic and atraumatic.  Eyes:  PERRLA.  EOMI.  Sclerae  clear.  Oropharynx clear  without exudate.  NECK:  Without lymphadenopathy.  Without JVD.  ENDOCRINE:  Without thyromegaly.  CARDIAC:  Normal S1 and S2.  Regular rate and rhythm without murmurs,  clicks, rubs, or gallops.  LUNGS:  Clear to auscultation bilaterally without wheezes or rhonchi or  rales.  SKIN:  Without rashes.  VASCULAR SYSTEM:  Without carotid bruits bilaterally.  Femoral artery pulses  2+ bilaterally without bruits.  Dorsalis pedis and posterior tibialis pulses  2+ bilaterally.  ABDOMEN:  Soft, nontender, with normoactive bowel sounds.  No hepatomegaly.  EXTREMITIES:  Without clubbing, cyanosis, or edema.  MUSCULOSKELETAL:  Without spine or CVA tenderness.  NEUROLOGIC:  Nonfocal.  Alert and oriented x 3.   LABORATORY DATA AND OTHER STUDIES:  Chest x-ray is pending.   EKG reveals normal sinus rhythm with heart rate of 79 and no acute changes.   IMPRESSION:  1.  Chest pain.  2.  Nonobstructive coronary artery disease by catheterization in 2000.      1.  Nonischemic Cardiolite April 2005.      2.  Good left ventricular function.      3.  Mild aortic insufficiency.  3.  Diabetes mellitus, type 2.  4.  Hypertension.  5.  Hyperlipidemia.  6.  Gastroesophageal reflux disease.  7.  Irritable bowel syndrome.  8.  Fibromyalgia.  9.  Degenerative joint disease/degenerative disk disease.  10. Sleep apnea.   PLAN:  The patient was seen and examined by Dr. Andee Kim.  We plan to admit  her to [facility] tonight and treat her with aspirin and her usual  medications as well as nitroglycerin paste.  If her enzymes are negative,  will discharge her home tomorrow with plans for outpatient Myoview study in  the office next week and close followup with Dr. Riley Kim.  If her enzymes  are positive, then will set her up for cardiac catheterization.  Will also  check a D-dimer to rule out the possibility of pulmonary embolism.  If this  is positive, will place her on heparin.  Attending addendum.  Patient left the ER after midnight without notification  to the ER staff. I was appropriatly notified. we plan to call the patient at  home to make sure she has a F/U appointment in our office.  Scot   SW/MEDQ  D:  10/05/2004  T:  10/05/2004  Job:  [id number]   cc:   Jeanne Kim. Jeanne Kim, M.D. [facility]

## 2011-02-23 NOTE — Cardiovascular Report (Signed)
Jeanne Kim, Jeanne Kim                ACCOUNT NO.:  [id number]   MEDICAL RECORD NO.:  [id number]          PATIENT TYPE:  OIB   LOCATION:  6501                         FACILITY:  [facility]   PHYSICIAN:  Arturo Morton. Riley Kill, M.D. [facility] OF BIRTH:  12-12-1940   DATE OF PROCEDURE:  10/17/2004  DATE OF DISCHARGE:                              CARDIAC CATHETERIZATION   PROCEDURE:  1.  Left heart catheterization.  2.  Selective coronary arteriography.  3.  Selective left ventriculography.  4.  Aortic root aortography.   CARDIOLOGIST:  Arturo Morton. Riley Kill, M.D.   INDICATIONS FOR PROCEDURE:  The patient is a very nice lady who recently  presented to the emergency room.  She was found to have a CK-MB of 4.5 and a  slightly low potassium.  After 10 hours she left against medical advice, and  they had planned to admit her.  The current study was done to assess  coronary anatomy, because of the episode of chest pain and borderline  positive troponin.  The risks, benefits and alternatives were discussed with  the patient prior to the procedure.   DESCRIPTION OF PROCEDURE:  The patient was brought to the cardiac  catheterization laboratory and prepped and draped in the usual fashion.  Through an anterior puncture in the right femoral artery, it was easily  entered.  A 4-French sheath was placed.  Central aortic and left ventricular  pressures were measured with a pigtail.  The ventriculography was performed  in the RAO projection.  Overall systolic function was preserved.  The  pigtail was pulled back and a proximal root aortography was performed.  Following this a coronary arteriography was performed.  There was no ostial  spasm.  She tolerated the procedure well.  She was taken to the holding area  in satisfactory clinical condition.   HEMODYNAMIC DATA:  Central aortic pressure:  111/59.  Left ventricular pressure:  113/3.  No gradient on pullback across the aortic valve.   ANGIOGRAPHIC DATA:   Ventriculography in the RAO projection revealed well-  preserved global systolic function.  No segmental abnormalities of  contraction were identified.  There did not appear to be significant mitral  regurgitation.   The aortic root appears upper normal in size.  There appears to be no  dissection, nor is there significant aortic regurgitation.   RESULTS:  1.  LEFT MAIN CORONARY ARTERY:  The left main coronary artery is free of      critical disease.  Unlike the previous study, there does not appear to      be 20% tapered narrowing.  2.  LEFT ANTERIOR DESCENDING CORONARY ARTERY:  The left anterior descending      coronary artery courses to the apex.  As on previous studies, there is      about a 30% area of eccentric plaquing noted between the origins of the      two diagonal branches.  The vessel was otherwise without critical      disease.  3.  CIRCUMFLEX CORONARY ARTERY:  The circumflex coronary artery consists of  a very large one marginal branch that courses out over the apical      lateral wall.  No significant focal stenosis is noted.  4.  RIGHT CORONARY ARTERY:  The right coronary artery as in the previous      study is a large caliber vessel that provides twin PDA's, and two      posterolateral branches.  Neither of these demonstrate significant      obstruction.   CONCLUSION:  1.  Well-preserved left ventricular function.  2.  No evidence of aortic regurgitation or dissection.  3.  A 30% to at most 40% mid-left anterior descending coronary artery      stenosis, as noted on the previous study.  4.  No other high-grade critical obstructions.   PLAN:  Continued medical therapy would be recommended.  The patient will  remain on her Lipitor.  I will see her back in followup in the office and we  will discuss other potential options.  We could use long-acting nitrates.  We will continue to see her on a regular basis.       TDS/MEDQ  D:  10/17/2004  T:  10/17/2004   Job:  [id number]   cc:   CV Laboratory - [facility]   [place]  [address] [place]  [place] [address]  Fax: [phone]

## 2011-02-23 NOTE — Procedures (Signed)
[facility]  Patient:    Jeanne Kim, Jeanne Kim                       MRN: [id number] Proc. Date: 05/08/00 Adm. Date:  [id number] Disc. Date: [id number] Attending:  Mardella Layman CC:         Vania Rea. Jarold Motto, M.D. [facility]   Procedure Report  PROCEDURE PERFORMED:  Endoscopy.  ENDOSCOPIST:  Vania Rea. Jarold Motto, M.D.  INDICATIONS:  Jeanne Kim is a 70 year old white female with diabetes, chronic constipation and chronic gastroparesis.  She was seen in the office yesterday complaining of melanotic stools for several days and has been using NSAIDs. Physical exam in the office was fairly unremarkable and stool however was guaiac negative.  It was felt that endoscopy was indicated to exclude an NSAID induced gastric ulceration.  The risks and benefits of this procedure were explained in detail, and she agreed to proceed as planned.  Preoperative cardiopulmonary and mental status exam is remarkable.  DESCRIPTION OF PROCEDURE:  Throughout this procedure, the patient was on pulse oximetry and cardiac monitorization.  She tolerated this procedure well and received supplemental low-flow oxygen by nasal cannula throughout the procedure.  She was anesthetized with Cetacaine spray to the oropharynx, fentanyl 50 mcg IV and Versed 6.5 mg IV.  She was intubated using the Olympus adult video endoscope.  The hypopharynx and vocal cords appeared normal.  The esophagus throughout its length was unremarkable without mucosal or polypoid lesions.  The endoscope easily passed into the stomach.  There were no retained food products or blood in the stomach.  The mucosal pattern of the fundus and body of the stomach was unremarkable.  There were erosions in the antrum of the stomach with some submucosal hemorrhage, but no evidence of ulceration or active bleeding.  Pictures were obtained as were mucosal biopsies that were placed in CLO media.  Duodenal bulb and duodenal sweep appeared  normal.  Biopsy obtained of the duodenal area for pathologic exam. The endoscope was then withdrawn into the stomach and retroflex view of the fundus and cardia of the stomach otherwise was unremarkable.  The patient was then extubated without difficulty.  She tolerated this procedure well.  ASSESSMENT: 1. Erosive antral gastritis probably secondary to nonsteroidal    anti-inflammatory drug use, rule    out Helicobacter pylori infection. 2. Well-compensated gastroparesis associated with her diabetes. 3. Duodenal biopsy obtained to exclude spur per her nonspecific    chronic gastrointestinal complaints.  RECOMMENDATIONS: 1. Increase PPI therapy to twice a day. 2. Continue gastroparesis diet and Reglan therapy. 3. Follow up on duodenal biopsies. 4. Office followup in one months time. DD:  05/08/00 TD:  05/09/00 Job: [id number]
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
## 2011-02-23 NOTE — Op Note (Signed)
Jeanne Kim, Jeanne Kim                          ACCOUNT NO.:  [id number]   MEDICAL RECORD NO.:  [id number]                   PATIENT TYPE:  INP   LOCATION:  2899                                 FACILITY:  [facility]   PHYSICIAN:  Payton Doughty, M.D.                   DATE OF BIRTH:  1941/04/23   DATE OF PROCEDURE:  11/16/2003  DATE OF DISCHARGE:                                 OPERATIVE REPORT   PREOPERATIVE DIAGNOSIS:  Spondylosis at C4-C5, C5-C6, and C6-C7.   POSTOPERATIVE DIAGNOSIS:  Spondylosis at C4-C5, C5-C6, and C6-C7.   OPERATIVE PROCEDURE:  C4-C5, C5-C6, and C6-C7 anterior cervical  decompression and fusion with a tethered plate.   SURGEON:  Payton Doughty, M.D.   SERVICE:  Neurosurgery   ANESTHESIA:  General endotracheal anesthesia.   PREPARATION:  Betadine and alcohol wipe.   COMPLICATIONS:  None.   ASSISTANT:  Cristi Loron, M.D.   BODY OF TEXT:  70 year old lady with severe cervical spondylitic myelopathy.  She is taken to the operating room, smoothly anesthetized and intubated,  placed supine on the operating table.  Following shave, prep, and drape in  the usual sterile fashion, the skin was incised starting 2 fingerbreadths  above the clavicle paralleling the medial border of the sternocleidomastoid  muscle for approximately 5 cm.  The platysma was identified, elevated,  divided, and undermined.  The sternocleidomastoid was identified and  dissection revealed the carotid artery retracted laterally to the left,  trachea and esophagus retracted laterally to the right, exposing the bones  of the anterior cervical spine.  A marker was placed and interoperative x-  ray was obtained and confirmed the correct level.  Having confirmed the  correct level, the longus colli was taken down and self-retaining retractor  placed.  A discectomy was carried out at C4-C5, C5-C6, and C6-C7 under gross  observation.  The operating microscope was then brought in and we used  microdissection technique to complete the discectomy, dissect the anterior  epidural space from the posterior longitudinal ligament, and decompress the  neural foramina.  At C4-C5, there is moderate spondylosis with central disc.  At C5-C6, there was severe spondylosis with compression slightly worse on  the left than on the right.  C6-C7 was roughly comparable to C5-C6.  Following complete decompression, the anterior epidural space was carefully  palpated and found to be open.  The entire area was irrigated and hemostasis  assured.  7 mm bone grafts were fashioned with patellar allograft and tapped  into place at each level.  A six hole tethered plate was then placed with 12  mm screws, two in C4, one in C5, one in C6, and two in C7.  Interoperative x-  ray showed good placement of bone grafts, plate, and screws.  The wound was  once again irrigated and hemostasis assured.  The platysma was  reapproximated with 3-0  Vicryl in an interrupted fashion, the subcutaneous  tissue were reapproximated with 3-0  Vicryl in an interrupted fashion, the skin was closed with 4-0 Vicryl in a  running subcuticular fashion.  Benzoin and Steri-Strips were placed and made  occlusive with Telfa and OpSite and the patient returned to the recovery  room in good condition after being placed in an Aspen collar.                                               Payton Doughty, M.D.    MWR/MEDQ  D:  11/16/2003  T:  11/16/2003  Job:  [id number]

## 2011-02-23 NOTE — Assessment & Plan Note (Signed)
[facility]                           GASTROENTEROLOGY OFFICE NOTE   NAME:Borton, [name]                       MRN:          [id number]  DATE:04/23/2006                            DOB:          [birth year]    Jeanne Kim is much better after taking Zyvoxam, but her symptomatology has  returned.  She had known adhesions with previous lysis of adhesions at [facility] in 2002.  Recent endoscopy and colonoscopy were unremarkable  including ileal, small-bowel and gastric biopsies.   She currently is taking Amitza 24 mcg twice a day in addition to Nexium 40  mg a day and her other multiple medications.  She is voluntarily trying to  lose weight.   Vital signs today are all normal.  General physical exam is not repeated.   ASSESSMENT:  I think Jeanne Kim has an atonic gut complicated by vascular  adhesions and bacterial overgrowth syndrome.   RECOMMENDATIONS:  1.  Continue twice a day Amitza.  2.  Repeat treatment with Zyvoxam 400 mg 3 times a day for 10 days.  3.  Outpatient small-bowel series.  4.  Continue reflux regimen daily with Nexium.  5.  GI followup in one month's time or as needed.                                   Vania Rea. Jarold Motto, MD, [facility], [place]   DRP/MedQ  DD:  04/23/2006  DT:  04/23/2006  Job #:  [id number]

## 2011-02-23 NOTE — Assessment & Plan Note (Signed)
[facility]                            CARDIOLOGY OFFICE NOTE   NAME:Spielberg, Jeanne Kim                       MRN:          [id number]  DATE:12/29/2007                            DOB:          1941/03/20    Jeanne Kim is in for a follow-up visit.  In general, she has gotten along  about the same.  She continues to have some chest discomfort from time  to time.  It is, however, nothing unusual.  Her echocardiogram done in  November revealed mild increase in aortic valve thickness and very mild  a aortic root dilatation.  Overall LV function was normal at about 55%.   CURRENT MEDICATIONS:  1. Actos 30 mg daily,  2. Armour Thyroid daily.  3. Insulin.  4. Lipitor 10 mg daily.  5. Altace 10 mg daily.  6. Maxzide 25 mg daily.  7. Nexium 40 mg q.h.s.  8. K-Dur 20 mEq b.i.d.  9. Cymbalta 30 mg daily.  10.Flonase 2 puffs q.h.s.  11.Ambien 10 mg p.r.n.  12.Singulair one q.h.s.  13.Toprol 50 mg daily.  14.Aspirin 81 mg daily.  15.Caltrate 600 mg daily.  16.CPAP.  17.Glucovance 5/500 two tablets b.i.d.  18.Imdur 15 mg daily.  19.Xifaxan.   PHYSICAL EXAMINATION:  GENERAL:  She is unchanged.  VITAL SIGNS:  Weight is 195, blood pressure 118/69, pulse 65.  LUNGS:  The lung fields remained clear.  HEART:  Cardiac rhythm is regular.  There is normal first and second  heart sound.  There is a minimal systolic ejection murmur.   ELECTROCARDIOGRAM:  Normal sinus rhythm; essentially within normal  limits.   IMPRESSION:  1. Mild coronary irregularities.  2. Insulin-requiring diabetes mellitus.  3. Moderate obesity.   PLAN:  Return to clinic in 6 months.     Arturo Morton. Riley Kill, MD, [facility]  Electronically Signed    TDS/MedQ  DD: 12/29/2007  DT: 12/29/2007  Job #: [id number]

## 2011-02-23 NOTE — H&P (Signed)
[facility]  Patient:    Jeanne Kim, Jeanne Kim                      MRN: [id number] Adm. Date:  05/02/01 Attending:  Radene Knee., M.D.                         History and Physical  DATE OF BIRTH:  [birth year]  REASON FOR ADMISSION:  This patient is scheduled on May 02, 2001 for cystoscopy, urethral dilation and hydrodistention of the bladder with a chief complaint of blood in the urine, recurrent urinary tract infections and dysuria.  PRESENT ILLNESS:  This patient, age 70, has had a history of urinary calculi since 1979 or 1980 and in 1987, she had ESWL on the left which was complicated by urosepsis.  The patient has had recurrent urinary tract infections, has been followed at irregular intervals in our office and came to the office April 15, 2001, reported that she had seen blood in her urine last week, had some small clots in the urine as well.  She had had some discomfort in her left side, was getting up once a night and voiding every two to three hours and had been taking Pyridium and Cipro from her local doctor.  The ultrasound revealed no hydronephrosis, right or left, but she did at that time have 4 or 5 ounces in her bladder.  She had a culture obtained and her Cipro was refilled.  Her laboratory studies revealed a creatinine of 0.5.  The electrolytes were 134 sodium, 3.3 potassium, 94 chloride and 1.9 total bilirubin.  The WBC was 13,500.  Hematocrit was 42.  IVP revealed tiny stones in the lower portion of the right and left kidneys but no ureteral calculi and no obstruction were noted and this was done April 17, 2001 at [facility].  Patient returned to the office April 22, 2001 and again had 3 or 4 ounces in her bladder, her urinalysis had cleared on the Cipro and her culture had shown no growth.  The patient, in view of the hematuria, was advised to have cystoscopy and urethral dilation, possible hydrodistention and this is scheduled at [facility], May 02, 2001, since the patient declined to have this in the office without anesthesia.  ALLERGIES:  SULFA.  PAST MEDICAL HISTORY: 1. The patient has had recurrent urinary tract infection and chronic cystitis. 2. She had a right ovarian cystectomy, February of 2002. 3. She had ESWL on the left in 1987 complicated by urosepsis. 4. She had a hysterectomy in 1985. 5. She has diabetes mellitus. 6. She has renal calculi dating back to 1979.  PRESENT MEDICATIONS:  Aspirin, Altace, Glucovance, HCTZ, Zyrtec, Celexa, Premarin and Ambien; she also takes Lasix, Prevacid and Reglan.  SOCIAL HISTORY:  The patient is married.  She has four children.  She does not abuse alcohol or tobacco.  Works as a housewife.  FAMILY HISTORY:  Positive for diabetes in the mother and grandmother and negative for stones, bleeders, blood pressure and heart disease.  REVIEW OF SYSTEMS:  GENERAL:  Health has been fair.  Weight stable.  HEENT: Vision and hearing are good.  CARDIORESPIRATORY:  No chest pain.  No heart attack.  GI:  She does have irritable bowel syndrome and total bilirubin was 1.9, although she denies hepatitis or peptic ulcer disease.  Bowels have been a little irregular.  BONES, JOINTS AND MUSCLES:  Unremarkable except for some arthritis.  NEUROPSYCHIATRIC:  Denies stroke, fainting, falling-out spells. OB/GYN:  She had a hysterectomy in 1985.  Four children, four pregnancies. SKIN AND INTEGUMENTARY:  Unremarkable.  HEMATOLOGIC AND LYMPHATIC: Unremarkable.  PHYSICAL EXAMINATION:  GENERAL:  Physical examination reveals a well-developed, well-nourished 70 year old female with temperature 98, pulse 80, respirations 16, blood pressure 140/70.  HEENT:  The ears and tympanic membranes are unremarkable.  Eyes reactive normally to light and accommodation.  Extraocular movements intact.  Pharynx benign.  Teeth in good repair.  NECK:  No enlargement of thyroid.  No nodes are palpable.  CHEST:   Clear to percussion and auscultation.  HEART:  Normal sinus rhythm with no murmur detected.  BREASTS:  Not examined.  ABDOMEN:  No masses.  Liver, kidneys, spleen, hernia not detected.  The left flank tenderness has cleared.  PELVIC:  Meatus and external genitalia normal.  There is fairly good support to the bladder and urethra.  Uterus absent.  Rectal tone good.  The patient has no rectal or pelvic masses palpated.  EXTREMITIES:  No edema.  Good peripheral pulses.  NEUROLOGIC:  Grossly normal reflexes and sensation.  IMPRESSION: 1. Recurrent urinary tract infections and hemorrhagic cystitis. 2. Extracorporeal shock wave lithotripsy in 1987 complicated by urosepsis,    left. 3. Renal calculi dating to 1979. 4. Hysterectomy in 1985. 5. Diabetes mellitus. 6. Right ovarian cystectomy, February of 2002.  PLAN:  Cystoscopy, hydrodistention, urethral dilation and continue Clorpactin. DD:  04/29/01 TD:  04/29/01 Job: [id number]

## 2011-03-29 ENCOUNTER — Other Ambulatory Visit: Payer: Self-pay | Admitting: *Deleted

## 2011-03-29 MED ORDER — ATORVASTATIN CALCIUM 20 MG PO TABS: 20.0000 mg | ORAL_TABLET | Freq: Every day | ORAL | Status: DC

## 2011-07-13 DIAGNOSIS — E119 Type 2 diabetes mellitus without complications: Secondary | ICD-10-CM | POA: Insufficient documentation

## 2011-07-13 DIAGNOSIS — E039 Hypothyroidism, unspecified: Secondary | ICD-10-CM | POA: Insufficient documentation

## 2011-10-24 ENCOUNTER — Other Ambulatory Visit: Payer: Self-pay | Admitting: Gynecology

## 2011-11-09 ENCOUNTER — Encounter: Payer: Self-pay | Admitting: Gastroenterology

## 2011-11-27 ENCOUNTER — Encounter: Payer: Self-pay | Admitting: Gastroenterology

## 2011-11-27 ENCOUNTER — Other Ambulatory Visit (INDEPENDENT_AMBULATORY_CARE_PROVIDER_SITE_OTHER): Payer: Medicare Other

## 2011-11-27 ENCOUNTER — Ambulatory Visit (INDEPENDENT_AMBULATORY_CARE_PROVIDER_SITE_OTHER): Payer: 59 | Admitting: Gastroenterology

## 2011-11-27 DIAGNOSIS — R1319 Other dysphagia: Secondary | ICD-10-CM | POA: Insufficient documentation

## 2011-11-27 DIAGNOSIS — Z79899 Other long term (current) drug therapy: Secondary | ICD-10-CM

## 2011-11-27 DIAGNOSIS — Z9049 Acquired absence of other specified parts of digestive tract: Secondary | ICD-10-CM | POA: Insufficient documentation

## 2011-11-27 DIAGNOSIS — K589 Irritable bowel syndrome without diarrhea: Secondary | ICD-10-CM | POA: Insufficient documentation

## 2011-11-27 DIAGNOSIS — Z9089 Acquired absence of other organs: Secondary | ICD-10-CM

## 2011-11-27 DIAGNOSIS — R1310 Dysphagia, unspecified: Secondary | ICD-10-CM | POA: Insufficient documentation

## 2011-11-27 DIAGNOSIS — K219 Gastro-esophageal reflux disease without esophagitis: Secondary | ICD-10-CM

## 2011-11-27 DIAGNOSIS — K6389 Other specified diseases of intestine: Secondary | ICD-10-CM | POA: Insufficient documentation

## 2011-11-27 DIAGNOSIS — E119 Type 2 diabetes mellitus without complications: Secondary | ICD-10-CM

## 2011-11-27 DIAGNOSIS — R1314 Dysphagia, pharyngoesophageal phase: Secondary | ICD-10-CM

## 2011-11-27 DIAGNOSIS — K5900 Constipation, unspecified: Secondary | ICD-10-CM | POA: Insufficient documentation

## 2011-11-27 LAB — BASIC METABOLIC PANEL
CO2: 29 mEq/L (ref 19–32)
Chloride: 96 mEq/L (ref 96–112)
Creatinine, Ser: 0.5 mg/dL (ref 0.4–1.2)
Potassium: 4.1 mEq/L (ref 3.5–5.1)
Sodium: 139 mEq/L (ref 135–145)

## 2011-11-27 LAB — CBC WITH DIFFERENTIAL/PLATELET
Eosinophils Absolute: 0.4 10*3/uL (ref 0.0–0.7)
Eosinophils Relative: 4 % (ref 0.0–5.0)
HCT: 40.4 % (ref 36.0–46.0)
Lymphs Abs: 1.9 10*3/uL (ref 0.7–4.0)
MCHC: 33.1 g/dL (ref 30.0–36.0)
MCV: 88.3 fl (ref 78.0–100.0)
Monocytes Absolute: 0.9 10*3/uL (ref 0.1–1.0)
Neutrophils Relative %: 67.5 % (ref 43.0–77.0)
Platelets: 301 10*3/uL (ref 150.0–400.0)
RDW: 14.1 % (ref 11.5–14.6)
WBC: 9.8 10*3/uL (ref 4.5–10.5)

## 2011-11-27 LAB — HEPATIC FUNCTION PANEL
ALT: 34 U/L (ref 0–35)
AST: 28 U/L (ref 0–37)
Alkaline Phosphatase: 58 U/L (ref 39–117)
Bilirubin, Direct: 0.2 mg/dL (ref 0.0–0.3)
Total Bilirubin: 0.9 mg/dL (ref 0.3–1.2)

## 2011-11-27 LAB — TSH: TSH: 1.06 u[IU]/mL (ref 0.35–5.50)

## 2011-11-27 LAB — FOLATE: Folate: >24.8 ng/mL (ref >5.9)

## 2011-11-27 LAB — IBC PANEL: Saturation Ratios: 18.4 % — ABNORMAL LOW (ref 20.0–50.0)

## 2011-11-27 MED ORDER — RABEPRAZOLE SODIUM 20 MG PO TBEC: 20.0000 mg | DELAYED_RELEASE_TABLET | Freq: Every day | ORAL | Status: DC

## 2011-11-27 MED ORDER — PEG-KCL-NACL-NASULF-NA ASC-C 100 G PO SOLR: 1.0000 | Freq: Once | ORAL | Status: DC

## 2011-11-27 NOTE — Progress Notes (Signed)
History of Present Illness:  This is a very pleasant 71 year old Caucasian female status post recent mastectomy and chemoradiation for breast cancer. She has a long history of gastrointestinal issues with previous abdominal surgery, resultant adhesions, and she has been treated with several occasions for bacterial overgrowth syndrome. She also suffers from obesity and type 2 diabetes, coronary artery disease followed by Dr. Riley Kill, and a family history of colon cancer. She also has chronic GERD managed with Prilosec 20 mg a day, but she does have breakthrough symptoms and intermittent dysphagia. She is status post cholecystectomy. She continues abdominal gas, bloating, and mild constipation, but denies rectal bleeding or melena or any specific hepatobiliary complaints. She is a retired Engineer, civil (consulting) . Other surgeries have included appendectomy, hysterectomy, removal of both ovaries, previous small bowel obstruction, multiple surgeries for kidney stones, and repair of cystocele and rectocele.  I have reviewed this patient's present history, medical and surgical past history, allergies and medications.     ROS: The remainder of the 10 point ROS is negative... she denies angina or nitroglycerin use at this time. She has had problems all her life with mild obesity, but apparently her diabetes is fairly well controlled.  Allergies  Allergen Reactions  . Codeine   . Sulfonamide Derivatives    Outpatient Prescriptions Prior to Visit  Medication Sig Dispense Refill  . atorvastatin (LIPITOR) 20 MG tablet Take 1 tablet (20 mg total) by mouth daily.  90 tablet  3   Past Medical History  Diagnosis Date  . Esophageal reflux   . Hiatal hernia   . Unspecified gastritis and gastroduodenitis without mention of hemorrhage   . Esophageal stricture   . Type II or unspecified type diabetes mellitus without mention of complication, not stated as uncontrolled   . Fibromyalgia   . Hypertension   . Anxiety   . Depression    . Sleep apnea     CPAP  . Kidney stones   . Hypercholesterolemia   . Thyroid disease   . IBS (irritable bowel syndrome)   . Breast cancer    Past Surgical History  Procedure Date  . Cardiac catheterization   . Total abdominal hysterectomy   . Appendectomy   . Cystocele repair   . Rectocele repair   . Thyroidectomy   . Breast lumpectomy     left  . Cervical fusion   . Cholecystectomy   . Tonsillectomy    History   Social History  . Marital Status: Married    Spouse Name: N/A    Number of Children: 4  . Years of Education: N/A   Occupational History  . Retired Engineer, civil (consulting)    Social History Main Topics  . Smoking status: Never Smoker   . Smokeless tobacco: Never Used  . Alcohol Use: No  . Drug Use: No  . Sexually Active: None   Other Topics Concern  . None   Social History Narrative  . None   Family History  Problem Relation Age of Onset  . Esophageal cancer Maternal Uncle   . Ovarian cancer Maternal Aunt   . Colon cancer Maternal Uncle   . Lung cancer Maternal Uncle   . Sarcoidosis Mother   . Lung cancer Father   . Diabetes Mother   . Diabetes Father   . Diabetes Maternal Uncle   . Diabetes Maternal Aunt   . Colon polyps Maternal Uncle   . Heart disease Father   . Heart disease Paternal Grandmother   . Urolithiasis Father  Physical Exam: General well developed well nourished patient in no acute distress, appearing her stated age Eyes PERRLA, no icterus, fundoscopic exam per opthamologist Skin no lesions noted Neck supple, no adenopathy, no thyroid enlargement, no tenderness Chest clear to percussion and auscultation Heart no significant murmurs, gallops or rubs noted Abdomen no hepatosplenomegaly masses or tenderness, BS normal.  Extremities no acute joint lesions, edema, phlebitis or evidence of cellulitis. Neurologic patient oriented x 3, cranial nerves intact, no focal neurologic deficits noted. Psychological mental status normal and  normal affect.  Assessment and plan: This patient does have a history of colon polyps, and a family history of colon cancer. She is due for followup colonoscopy which has been scheduled at her convenience with adjustments in her diabetic medications for this procedure. We will prep her with the balance electrolyte solution and use propofol sedation. I have placed her on AcipHex 20 mg a day for her GERD, and schedule followup endoscopy and probable dilatation. She is scheduled for cardiac evaluation with Dr. Riley Kill shortly. I repeated some labs today for baseline values.  Encounter Diagnoses  Name Primary?  . Esophageal reflux   . Constipation

## 2011-11-27 NOTE — Patient Instructions (Signed)
Your procedure has been scheduled for 12/12/2011, please follow the seperate instructions.  Your prescription(s) have been sent to you pharmacy.  Please go to the basement today for your labs.  Stop Prilosec. Start Aciphex once a day 30 min before Breakfast, Samples given and rx sent.

## 2011-11-28 ENCOUNTER — Other Ambulatory Visit: Payer: Self-pay | Admitting: Gastroenterology

## 2011-11-28 ENCOUNTER — Encounter: Payer: Self-pay | Admitting: Gastroenterology

## 2011-11-28 DIAGNOSIS — E538 Deficiency of other specified B group vitamins: Secondary | ICD-10-CM

## 2011-11-28 MED ORDER — CYANOCOBALAMIN 1000 MCG/ML IJ SOLN
1000.0000 ug | INTRAMUSCULAR | Status: AC
  Administered 2011-11-29 – 2011-12-13 (×3): 1000 ug via INTRAMUSCULAR

## 2011-11-28 MED ORDER — CYANOCOBALAMIN 1000 MCG/ML IJ SOLN
1000.0000 ug | INTRAMUSCULAR | Status: AC
  Administered 2012-01-18 – 2012-10-17 (×6): 1000 ug via INTRAMUSCULAR

## 2011-11-29 ENCOUNTER — Ambulatory Visit (INDEPENDENT_AMBULATORY_CARE_PROVIDER_SITE_OTHER): Payer: 59 | Admitting: Gastroenterology

## 2011-11-29 DIAGNOSIS — E538 Deficiency of other specified B group vitamins: Secondary | ICD-10-CM

## 2011-11-29 LAB — CELIAC PANEL 10
Gliadin IgA: 3.3 U/mL (ref <20)
Gliadin IgG: 3.2 U/mL (ref <20)
IgA: 229 mg/dL (ref 69–380)
Tissue Transglutaminase Ab, IgA: 3.4 U/mL (ref <20)

## 2011-12-06 ENCOUNTER — Ambulatory Visit (INDEPENDENT_AMBULATORY_CARE_PROVIDER_SITE_OTHER): Payer: 59 | Admitting: Gastroenterology

## 2011-12-06 DIAGNOSIS — D519 Vitamin B12 deficiency anemia, unspecified: Secondary | ICD-10-CM

## 2011-12-06 DIAGNOSIS — E538 Deficiency of other specified B group vitamins: Secondary | ICD-10-CM

## 2011-12-12 ENCOUNTER — Ambulatory Visit (AMBULATORY_SURGERY_CENTER): Payer: 59 | Admitting: Gastroenterology

## 2011-12-12 ENCOUNTER — Encounter: Payer: Self-pay | Admitting: Gastroenterology

## 2011-12-12 DIAGNOSIS — D1330 Benign neoplasm of unspecified part of small intestine: Secondary | ICD-10-CM

## 2011-12-12 DIAGNOSIS — K219 Gastro-esophageal reflux disease without esophagitis: Secondary | ICD-10-CM

## 2011-12-12 DIAGNOSIS — K589 Irritable bowel syndrome without diarrhea: Secondary | ICD-10-CM

## 2011-12-12 DIAGNOSIS — Z860100 Personal history of colon polyps, unspecified: Secondary | ICD-10-CM

## 2011-12-12 DIAGNOSIS — R1314 Dysphagia, pharyngoesophageal phase: Secondary | ICD-10-CM

## 2011-12-12 DIAGNOSIS — R1310 Dysphagia, unspecified: Secondary | ICD-10-CM

## 2011-12-12 DIAGNOSIS — K6389 Other specified diseases of intestine: Secondary | ICD-10-CM

## 2011-12-12 DIAGNOSIS — K5730 Diverticulosis of large intestine without perforation or abscess without bleeding: Secondary | ICD-10-CM | POA: Insufficient documentation

## 2011-12-12 DIAGNOSIS — Z800 Family history of malignant neoplasm of digestive organs: Secondary | ICD-10-CM

## 2011-12-12 DIAGNOSIS — K5900 Constipation, unspecified: Secondary | ICD-10-CM

## 2011-12-12 DIAGNOSIS — K2990 Gastroduodenitis, unspecified, without bleeding: Secondary | ICD-10-CM

## 2011-12-12 DIAGNOSIS — K2970 Gastritis, unspecified, without bleeding: Secondary | ICD-10-CM

## 2011-12-12 DIAGNOSIS — K222 Esophageal obstruction: Secondary | ICD-10-CM | POA: Insufficient documentation

## 2011-12-12 DIAGNOSIS — R1319 Other dysphagia: Secondary | ICD-10-CM

## 2011-12-12 MED ORDER — SODIUM CHLORIDE 0.9 % IV SOLN: 500.0000 mL | INTRAVENOUS | Status: DC

## 2011-12-12 NOTE — Progress Notes (Signed)
Patient did not experience any of the following events: a burn prior to discharge; a fall within the facility; wrong site/side/patient/procedure/implant event; or a hospital transfer or hospital admission upon discharge from the facility. (G8907) Patient did not have preoperative order for IV antibiotic SSI prophylaxis. (G8918)  

## 2011-12-12 NOTE — Patient Instructions (Signed)
YOU HAD AN ENDOSCOPIC PROCEDURE TODAY AT THE [facility]: Refer to the procedure report that was given to you for any specific questions about what was found during the examination.  If the procedure report does not answer your questions, please call your gastroenterologist to clarify.  If you requested that your care partner not be given the details of your procedure findings, then the procedure report has been included in a sealed envelope for you to review at your convenience later.  YOU SHOULD EXPECT: Some feelings of bloating in the abdomen. Passage of more gas than usual.  Walking can help get rid of the air that was put into your GI tract during the procedure and reduce the bloating. If you had a lower endoscopy (such as a colonoscopy or flexible sigmoidoscopy) you may notice spotting of blood in your stool or on the toilet paper. If you underwent a bowel prep for your procedure, then you may not have a normal bowel movement for a few days.  DIET: Your first meal following the procedure should be a light meal and then it is ok to progress to your normal diet.  A half-sandwich or bowl of soup is an example of a good first meal.  Heavy or fried foods are harder to digest and may make you feel nauseous or bloated.  Likewise meals heavy in dairy and vegetables can cause extra gas to form and this can also increase the bloating.  Drink plenty of fluids but you should avoid alcoholic beverages for 24 hours.  ACTIVITY: Your care partner should take you home directly after the procedure.  You should plan to take it easy, moving slowly for the rest of the day.  You can resume normal activity the day after the procedure however you should NOT DRIVE or use heavy machinery for 24 hours (because of the sedation medicines used during the test).    SYMPTOMS TO REPORT IMMEDIATELY: A gastroenterologist can be reached at any hour.  During normal business hours, 8:30 AM to 5:00 PM Monday through Friday,  call [phone].  After hours and on weekends, please call the GI answering service at [phone] who will take a message and have the physician on call contact you.   Following lower endoscopy (colonoscopy or flexible sigmoidoscopy):  Excessive amounts of blood in the stool  Significant tenderness or worsening of abdominal pains  Swelling of the abdomen that is new, acute  Fever of 100F or higher  Following upper endoscopy (EGD)  Vomiting of blood or coffee ground material  New chest pain or pain under the shoulder blades  Painful or persistently difficult swallowing  New shortness of breath  Fever of 100F or higher  Black, tarry-looking stools  FOLLOW UP: If any biopsies were taken you will be contacted by phone or by letter within the next 1-3 weeks.  Call your gastroenterologist if you have not heard about the biopsies in 3 weeks.  Our staff will call the home number listed on your records the next business day following your procedure to check on you and address any questions or concerns that you may have at that time regarding the information given to you following your procedure. This is a courtesy call and so if there is no answer at the home number and we have not heard from you through the emergency physician on call, we will assume that you have returned to your regular daily activities without incident.  SIGNATURES/CONFIDENTIALITY: You and/or your care   partner have signed paperwork which will be entered into your electronic medical record.  These signatures attest to the fact that that the information above on your After Visit Summary has been reviewed and is understood.  Full responsibility of the confidentiality of this discharge information lies with you and/or your care-partner.   Handouts on diverticulosis, high fiber diet, hemorrhoids,gastritis, hiatal hernia, dilation diet,stricture Repeat colonoscopy in 5 years We will mail you a letter in 1-2 weeks with the  results of the biopsies from the endoscopy and Dr Norval Gable recommendations. Dilated a stricture in your esophagus Continue all your current medicines. Dr Jarold Motto suggested calling Hulan Saas his CMA to ask for samples of aciphex at [phone].

## 2011-12-12 NOTE — Progress Notes (Signed)
Propofol was administered by Mark Smith, CRNA. Maw  The pt tolerated the colonoscopy very well. Maw  The pt tolerated the egd very well. Maw   

## 2011-12-12 NOTE — Op Note (Signed)
[facility] [address] [place], [place]  [address]  COLONOSCOPY PROCEDURE REPORT  PATIENT:  Jeanne Kim, Jeanne Kim  MR#:  [id number] BIRTHDATE:  Dec 27, 1940, 70 yrs. old  GENDER:  female ENDOSCOPIST:  Vania Rea. Jarold Motto, MD, [facility] REF. BY: PROCEDURE DATE:  12/12/2011 PROCEDURE:  Diagnostic Colonoscopy ASA CLASS:  Class III INDICATIONS:  family history of colon cancer, history of pre-cancerous (adenomatous) colon polyps gas and bloating and abdominal pain. MEDICATIONS:   propofol (Diprivan) 200 mg IV  DESCRIPTION OF PROCEDURE:   After the risks and benefits and of the procedure were explained, informed consent was obtained. Digital rectal exam was performed and revealed no abnormalities. The LB 180AL [id number] endoscope was introduced through the anus and advanced to the cecum, which was identified by both the appendix and ileocecal valve.  The quality of the prep was adequate, using MoviPrep.  The instrument was then slowly withdrawn as the colon was fully examined. <<PROCEDUREIMAGES>>  FINDINGS:  There were mild diverticular changes in left colon. diverticulosis was found.  Internal Hemorrhoids were found.  No polyps or cancers were seen.   Retroflexed views in the rectum revealed no abnormalities.    The scope was then withdrawn from the patient and the procedure completed.  COMPLICATIONS:  None ENDOSCOPIC IMPRESSION: 1) Diverticulosis,mild,left sided diverticulosis 2) Internal hemorrhoids 3) No polyps or cancers RECOMMENDATIONS: 1) High fiber diet. 2) Given your significant family history of colon cancer, you should have a repeat colonoscopy in 5 years  REPEAT EXAM:  No  ______________________________ Vania Rea. Jarold Motto, MD, [facility]  CC:  Jarvis Newcomer MD  n. Rosalie DoctorVania Rea. Ivyonna Hoelzel at 12/12/2011 10:08 AM  Haroldine Laws, [phone]

## 2011-12-12 NOTE — Op Note (Signed)
[facility] [address] [place], [place]  [address]  ENDOSCOPY PROCEDURE REPORT  PATIENT:  Jeanne Kim, Jeanne Kim  MR#:  [id number] BIRTHDATE:  1941/05/14, 70 yrs. old  GENDER:  female  ENDOSCOPIST:  Vania Rea. Jarold Motto, MD, [facility] Referred by:  PROCEDURE DATE:  12/12/2011 PROCEDURE:  EGD with biopsy, [address], [place] Dilation of Esophagus ASA CLASS:  Class III INDICATIONS:  GERD, dysphagia  MEDICATIONS:   There was residual sedation effect present from prior procedure., propofol (Diprivan) 80 mg IV TOPICAL ANESTHETIC:  DESCRIPTION OF PROCEDURE:   After the risks and benefits of the procedure were explained, informed consent was obtained.  The LB GIF-H180 [id number] endoscope was introduced through the mouth and advanced to the second portion of the duodenum.  The instrument was slowly withdrawn as the mucosa was fully examined. <<PROCEDUREIMAGES>>  Moderate gastritis was found in the body and the antrum of the stomach. biopsies done for H.pylori exam  Normal duodenal folds were noted. si biopsies done.scattered 2-56mm flat hyperplastic gastric noted.  A hiatal hernia was found. 5 cm. HH AND GE JUNCTION AT 30 CM.STRICTURE DILATED #38F MALONEY DILATOR,NO HEME OR PAIN.    Retroflexed views revealed a hiatal hernia.    The scope was then withdrawn from the patient and the procedure completed.  COMPLICATIONS:  None  ENDOSCOPIC IMPRESSION: 1) Moderate gastritis in the body and the antrum of the stomach  2) Normal duodenal folds 3) Hiatal hernia 4) A hiatal hernia 1.CHRONIC GERD AND STRICTURE DILATED 2.R/O H.PYLORI 3.R/O CELIAC DISEASE RECOMMENDATIONS: 1) Await biopsy results 2) Rx CLO if positive 3) continue current medications  ______________________________ Vania Rea. Jarold Motto, MD, [facility]  CC:  [name], MD  n. Rosalie DoctorMarland Kitchen   Vania Rea. Kamilia Carollo at 12/12/2011 10:18 AM  Haroldine Laws, [phone]

## 2011-12-12 NOTE — Progress Notes (Signed)
Hung second bag of normal saline 0.9% 500 ml. Maw

## 2011-12-13 ENCOUNTER — Telehealth: Payer: Self-pay

## 2011-12-13 ENCOUNTER — Ambulatory Visit (INDEPENDENT_AMBULATORY_CARE_PROVIDER_SITE_OTHER): Payer: 59 | Admitting: Gastroenterology

## 2011-12-13 ENCOUNTER — Telehealth: Payer: Self-pay | Admitting: Gastroenterology

## 2011-12-13 DIAGNOSIS — E538 Deficiency of other specified B group vitamins: Secondary | ICD-10-CM

## 2011-12-13 NOTE — Telephone Encounter (Signed)
Informed pt she will begin monthly B12 injections beginning in April. Pt scheduled for 01/15/12; pt stated understanding.

## 2011-12-13 NOTE — Telephone Encounter (Signed)
Left a message at [phone] for the pt to call if any questions or concerns. Maw

## 2011-12-15 ENCOUNTER — Emergency Department (HOSPITAL_COMMUNITY)
Admission: EM | Admit: 2011-12-15 | Discharge: 2011-12-15 | Disposition: A | Discharge status: Home / Self Care | Payer: Medicare Other | Attending: Emergency Medicine | Admitting: Emergency Medicine

## 2011-12-15 ENCOUNTER — Encounter (HOSPITAL_COMMUNITY): Payer: Self-pay | Admitting: Emergency Medicine

## 2011-12-15 DIAGNOSIS — H5710 Ocular pain, unspecified eye: Secondary | ICD-10-CM | POA: Insufficient documentation

## 2011-12-15 DIAGNOSIS — I10 Essential (primary) hypertension: Secondary | ICD-10-CM | POA: Insufficient documentation

## 2011-12-15 DIAGNOSIS — E119 Type 2 diabetes mellitus without complications: Secondary | ICD-10-CM | POA: Insufficient documentation

## 2011-12-15 DIAGNOSIS — Z794 Long term (current) use of insulin: Secondary | ICD-10-CM | POA: Insufficient documentation

## 2011-12-15 DIAGNOSIS — H539 Unspecified visual disturbance: Secondary | ICD-10-CM | POA: Insufficient documentation

## 2011-12-15 DIAGNOSIS — F411 Generalized anxiety disorder: Secondary | ICD-10-CM | POA: Insufficient documentation

## 2011-12-15 MED ORDER — TETRACAINE HCL 0.5 % OP SOLN
2.0000 [drp] | Freq: Once | OPHTHALMIC | Status: AC
  Administered 2011-12-15: 2 [drp] via OPHTHALMIC
  Filled 2011-12-15: qty 2

## 2011-12-15 NOTE — ED Notes (Signed)
Pt states that she started having flashes of light that were going straight down in her right eye. Pt states that she currently is not having the flashes of light, pt denies any problems with floater, black spots. Pt states she feels like her vision is blurred but pt able to perform the visual acuity with no problems. Pt states that she was worried about retinal detachment. Pt denies HA. Pt denies pain, discharge or ithching.

## 2011-12-15 NOTE — ED Notes (Signed)
PT. REPORTS RIGHT EYE PAIN " LIGHT FLASHING " ONSET THIS AFTERNOON , SLIGHT BLURRED VISION , DENIES INJURY OR FALL , STATES HISTORY OF CATARACT.

## 2011-12-15 NOTE — Discharge Instructions (Signed)
You may use artificial tears as needed for discomfort.  See Dr. Vonna Kotyk in the morning at 9:30am even if well for an improved exam.  Return immediate for further evaluation if you experience significant pain, decreased vision, or other concerns.   Visual Disturbances You have had a disturbance in your vision. This may be caused by various conditions, such as:  Migraines. Migraine headaches are often preceded by a disturbance in vision. Blind spots or light flashes are followed by a headache. This type of visual disturbance is temporary. It does not damage the eye.   Glaucoma. This is caused by increased pressure in the eye. Symptoms include haziness, blurred vision, or seeing rainbow colored circles when looking at bright lights. Partial or complete visual loss can occur. You may or may not experience eye pain. Visual loss may be gradual or sudden and is irreversible. Glaucoma is the leading cause of blindness.   Retina problems. Vision will be reduced if the retina becomes detached or if there is a circulation problem as with diabetes, high blood pressure, or a mini-stroke. Symptoms include seeing "floaters," flashes of light, or shadows, as if a curtain has fallen over your eye.   Optic nerve problems. The main nerve in your eye can be damaged by redness, soreness, and swelling (inflammation), poor circulation, drugs, and toxins.  It is very important to have a complete exam done by a specialist to determine the exact cause of your eye problem. The specialist may recommend medicines or surgery, depending on the cause of the problem. This can help prevent further loss of vision or reduce the risk of having a stroke. Contact the caregiver to whom you have been referred and arrange for follow-up care right away. SEEK IMMEDIATE MEDICAL CARE IF:   Your vision gets worse.   You develop severe headaches.   You have any weakness or numbness in the face, arms, or legs.   You have any trouble speaking or  walking.  Document Released: 11/01/2004 Document Revised: 09/13/2011 Document Reviewed: 02/22/2010 [facility] Patient Information 2012 [place], [place].

## 2011-12-15 NOTE — ED Provider Notes (Signed)
71 year old female has noted some flashing lights in her right superior temporal field of her right eye since this afternoon. There's also been some slight blurring of vision in that same part of her visual field. She's not had any eye pain. Visual acuity in that eye is normal. Funduscopic exam shows no obvious retinal detachment, but she will need an indirect ophthalmoscopy to effectively rule out retinal detachment. Ophthalmology will be consulted to arrange further evaluation.  Dione Booze, MD 12/15/11 [id number]

## 2011-12-15 NOTE — ED Provider Notes (Signed)
History     CSN: [id number]  Arrival date & time 12/15/11  2026   First MD Initiated Contact with Patient 12/15/11 2129      Chief Complaint  Patient presents with  . Eye flashers    (Consider location/radiation/quality/duration/timing/severity/associated sxs/prior treatment) HPI History provided by the patient.  71 year old female with a history of cataracts and diabetes presenting with complaint of right eye visual flashers. Patient states that she had acute right eye pain sometime this afternoon which lasted only seconds and was described as sharp and severe. This pain has not recurred. She has since had intermittent right eye vision changes which she describes as apparent flashes of lightning which appear in her right superior/temporal visual field of the right eye. She initially noted a single lightning bolt and has since noted two in that same area. This is also intermittent with the last episode of about 40 minutes duration and resolving soon after eating pending. Patient currently denies abnormal visual fields/lightening bolt but does state that she has had vague blurry vision.  No associated redness, discharge, or recent injury/trauma. Patient denies similar symptoms in the past.  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  Patient is planning to be seen by an ophthalmologist for her cataracts but has not been seen recently   Past Medical History  Diagnosis Date  . Esophageal reflux   . Hiatal hernia   . Unspecified gastritis and gastroduodenitis without mention of hemorrhage   . Esophageal stricture   . Type II or unspecified type diabetes mellitus without mention of complication, not stated as uncontrolled   . Fibromyalgia   . Hypertension   . Anxiety   . Depression   . Sleep apnea     CPAP  . Kidney stones   . Hypercholesterolemia   . Thyroid disease   . IBS (irritable bowel syndrome)   . Breast cancer   .  Cataract   . Cataract     Past Surgical History  Procedure Date  . Cardiac catheterization   . Total abdominal hysterectomy   . Appendectomy   . Cystocele repair   . Rectocele repair   . Thyroidectomy   . Breast lumpectomy     left  . Cervical fusion   . Cholecystectomy   . Tonsillectomy   . Cesarean section     Family History  Problem Relation Age of Onset  . Esophageal cancer Maternal Uncle   . Ovarian cancer Maternal Aunt   . Colon cancer Maternal Uncle   . Lung cancer Maternal Uncle   . Sarcoidosis Mother   . Lung cancer Father   . Diabetes Mother   . Diabetes Father   . Diabetes Maternal Uncle   . Diabetes Maternal Aunt   . Colon polyps Maternal Uncle   . Heart disease Father   . Heart disease Paternal Grandmother   . Urolithiasis Father     History  Substance Use Topics  . Smoking status: Never Smoker   . Smokeless tobacco: Never Used  . Alcohol Use: No    OB History    Grav Para Term Preterm Abortions TAB SAB Ect Mult Living                  Review of Systems  Constitutional: Negative for fever and chills.  HENT: Negative for congestion, sore throat and rhinorrhea.   Eyes: Positive for pain and visual disturbance. Negative for photophobia, discharge,  redness and itching.  Respiratory: Negative for cough, shortness of breath and wheezing.   Cardiovascular: Negative for chest pain and palpitations.  Gastrointestinal: Negative for nausea, vomiting, abdominal pain, diarrhea and blood in stool.  Genitourinary: Negative for dysuria and hematuria.  Musculoskeletal: Negative for back pain and gait problem.  Skin: Negative for rash and wound.  Neurological: Negative for dizziness, syncope, facial asymmetry, speech difficulty, weakness, numbness and headaches.  Psychiatric/Behavioral: Negative for confusion and agitation.  All other systems reviewed and are negative.    Allergies  Codeine and Sulfonamide derivatives  Home Medications   Current  Outpatient Rx  Name Route Sig Dispense Refill  . ALPRAZOLAM 0.5 MG PO TABS Oral Take 0.5 mg by mouth at bedtime.    . ANASTROZOLE 1 MG PO TABS Oral Take 1 mg by mouth daily.    . ATORVASTATIN CALCIUM 20 MG PO TABS Oral Take 1 tablet (20 mg total) by mouth daily. 90 tablet 3  . CYANOCOBALAMIN 1000 MCG/ML IJ SOLN Intramuscular Inject 1,000 mcg into the muscle every 30 (thirty) days.    . DULOXETINE HCL 60 MG PO CPEP Oral Take 60 mg by mouth daily.    . GLYBURIDE-METFORMIN 5-500 MG PO TABS Oral Take 2 tablets by mouth 2 (two) times daily with a meal.    . INSULIN DETEMIR 100 UNIT/ML [place] SOLN Subcutaneous Inject 25 Units into the skin at bedtime.    . INSULIN GLULISINE 100 UNIT/ML  SOLN Subcutaneous Inject 8 Units into the skin at bedtime.    Marland Kitchen LEVOTHYROXINE SODIUM 112 MCG PO TABS Oral Take 112 mcg by mouth daily.    Marland Kitchen METOPROLOL SUCCINATE ER 50 MG PO TB24 Oral Take 50 mg by mouth at bedtime. Take with or immediately following a meal.    . MONTELUKAST SODIUM 10 MG PO TABS Oral Take 10 mg by mouth at bedtime.    Marland Kitchen RABEPRAZOLE SODIUM 20 MG PO TBEC Oral Take 1 tablet (20 mg total) by mouth daily. 30 tablet 1  . RAMIPRIL 10 MG PO CAPS Oral Take 10 mg by mouth daily.    . TRIAMTERENE-HCTZ 37.5-25 MG PO TABS Oral Take 1 tablet by mouth daily.      BP 122/75  Pulse 80  Temp(Src) 97.3 F (36.3 C) (Oral)  Resp 20  SpO2 97%  Physical Exam  Nursing note and vitals reviewed. Constitutional: She is oriented to person, place, and time. She appears well-developed and well-nourished. No distress.  HENT:  Head: Normocephalic and atraumatic.  Right Ear: External ear normal.  Left Ear: External ear normal.  Nose: Nose normal.  Mouth/Throat: Oropharynx is clear and moist.  Eyes: Conjunctivae, EOM and lids are normal. Pupils are equal, round, and reactive to light.       VA:  Lt: 20/200; Rt 20/20  IOP:  Rt: 12  Grossly apparent cataracts bilaterally.  Grossly NL fundo exam bilaterally.   No  afferent pupillary defect appreciated.  No abnl visual fields.  Neck: Normal range of motion. Neck supple.  Cardiovascular: Normal rate, regular rhythm and intact distal pulses.   No murmur heard. Pulmonary/Chest: Effort normal and breath sounds normal. No respiratory distress.  Abdominal: Soft. Bowel sounds are normal. There is no tenderness.  Musculoskeletal: Normal range of motion. She exhibits no edema.  Neurological: She is alert and oriented to person, place, and time. She has normal strength. No cranial nerve deficit or sensory deficit. She displays a negative Romberg sign. Coordination and gait normal. GCS eye subscore is  4. GCS verbal subscore is 5. GCS motor subscore is 6.  Skin: Skin is warm and dry. No rash noted. She is not diaphoretic.  Psychiatric: Judgment normal.       Mildly anxious     ED Course  Procedures (including critical care time)  Labs Reviewed - No data to display No results found.   1. Visual disturbance of one eye       MDM  71 year old female presenting with complaint of seconds of right eye pain followed hours later with intermittent right temporal visual field "lightening bolts."  No other neurologic complaints.  Exam as above, right thigh with normal visual acuity and intraocular pressure; no appreciated abnormal visual fields and grossly normal funduscopic exam without apparent retinal detachment.  No sign of acute angle glaucoma; retinal detachment possible, although patient's symptoms have resolved. Ophthalmology consult.  10:51 PM - Spoke with Dr. Vonna Kotyk; does not suspect retinal detachment or temporal arteritis; possible ocular migraine.  Believes pt is safe for d/c and f/u in his office in the morning.  Strict return precautions given and pt reports understanding.       Particia Lather, MD 12/16/11 0020

## 2011-12-16 NOTE — ED Provider Notes (Signed)
I saw and evaluated the patient, reviewed the resident's note and I agree with the findings and plan.   Dione Booze, MD 12/16/11 [phone]

## 2011-12-18 ENCOUNTER — Encounter: Payer: Self-pay | Admitting: Gastroenterology

## 2012-01-01 ENCOUNTER — Ambulatory Visit (INDEPENDENT_AMBULATORY_CARE_PROVIDER_SITE_OTHER): Payer: Medicare Other | Admitting: Cardiology

## 2012-01-01 ENCOUNTER — Encounter: Payer: Self-pay | Admitting: Cardiology

## 2012-01-01 VITALS — BP 119/80 | HR 80 | Ht 64.0 in | Wt 185.0 lb

## 2012-01-01 DIAGNOSIS — R61 Generalized hyperhidrosis: Secondary | ICD-10-CM

## 2012-01-01 DIAGNOSIS — R002 Palpitations: Secondary | ICD-10-CM

## 2012-01-01 DIAGNOSIS — I2510 Atherosclerotic heart disease of native coronary artery without angina pectoris: Secondary | ICD-10-CM

## 2012-01-01 LAB — BASIC METABOLIC PANEL
BUN: 12 mg/dL (ref 6–23)
Chloride: 92 mEq/L — ABNORMAL LOW (ref 96–112)
Creatinine, Ser: 0.5 mg/dL (ref 0.4–1.2)
GFR: 127.18 mL/min (ref >60.00)
Glucose, Bld: 124 mg/dL — ABNORMAL HIGH (ref 70–99)
Potassium: 3.5 mEq/L (ref 3.5–5.1)

## 2012-01-01 NOTE — Patient Instructions (Signed)
Your physician recommends that you have lab work today: [facility]  Your physician has requested that you have an exercise tolerance test. For further information please visit [email or web address] Please also follow instruction sheet, as given.  Your physician has recommended that you wear an event monitor. Event monitors are medical devices that record the heart's electrical activity. Doctors most often [place] these monitors to diagnose arrhythmias. Arrhythmias are problems with the speed or rhythm of the heartbeat. The monitor is a small, portable device. You can wear one while you do your normal daily activities. This is usually used to diagnose what is causing palpitations/syncope (passing out).  Your physician recommends that you continue on your current medications as directed. Please refer to the Current Medication list given to you today.

## 2012-01-02 ENCOUNTER — Encounter (INDEPENDENT_AMBULATORY_CARE_PROVIDER_SITE_OTHER): Payer: Medicare Other

## 2012-01-02 DIAGNOSIS — R002 Palpitations: Secondary | ICD-10-CM

## 2012-01-12 NOTE — Progress Notes (Signed)
HPI:  Jeanne Kim is seen in a follow up visit.  A couple of weeks ago she had a couple of episodes of profuse sweating, dizziness, nausea, and had to lay on the floor.  She did not have a runaway pulse.  She felt like she wanted to throw up.  Her glucoses have been somewhat out of control, with A1c of 8.0, but when this occurred her sugar was ok.  She has some occasional chest pain, but these episodes were not necessarily associated with that.    Current Outpatient Prescriptions  Medication Sig Dispense Refill  . ALPRAZolam (XANAX) 0.5 MG tablet Take 0.5 mg by mouth at bedtime.      Marland Kitchen anastrozole (ARIMIDEX) 1 MG tablet Take 1 mg by mouth daily.      Marland Kitchen aspirin 325 MG tablet Take 325 mg by mouth daily.      Marland Kitchen atorvastatin (LIPITOR) 20 MG tablet Take 1 tablet (20 mg total) by mouth daily.  90 tablet  3  . calcium-vitamin D (OSCAL WITH D) 500-200 MG-UNIT per tablet Take 1 tablet by mouth 2 (two) times daily.      . Coenzyme Q10 (CO Q-10) 100 MG CAPS Take 100 mg by mouth daily.      . cyanocobalamin (,VITAMIN B-12,) 1000 MCG/ML injection Inject 1,000 mcg into the muscle every 30 (thirty) days.      . DULoxetine (CYMBALTA) 60 MG capsule Take 60 mg by mouth daily.      . fluticasone (FLONASE) 50 MCG/ACT nasal spray Place 2 sprays into the nose daily.      Marland Kitchen glyBURIDE-metformin (GLUCOVANCE) 5-500 MG per tablet Take 2 tablets by mouth 2 (two) times daily with a meal.      . insulin detemir (LEVEMIR) 100 UNIT/ML injection Inject 20 Units into the skin at bedtime. Sliding scale      . insulin glulisine (APIDRA) 100 UNIT/ML injection Inject 8 Units into the skin at bedtime.      Marland Kitchen levothyroxine (SYNTHROID, LEVOTHROID) 112 MCG tablet Take 112 mcg by mouth daily.      . metoprolol succinate (TOPROL-XL) 50 MG 24 hr tablet Take 50 mg by mouth at bedtime. Take with or immediately following a meal.      . montelukast (SINGULAIR) 10 MG tablet Take 10 mg by mouth at bedtime.      . RABEprazole (ACIPHEX) 20 MG tablet  Take 1 tablet (20 mg total) by mouth daily.  30 tablet  1  . ramipril (ALTACE) 10 MG capsule Take 10 mg by mouth daily.      Marland Kitchen triamterene-hydrochlorothiazide (MAXZIDE-25) 37.5-25 MG per tablet Take 1 tablet by mouth daily.       Current Facility-Administered Medications  Medication Dose Route Frequency Provider Last Rate Last Dose  . cyanocobalamin ((VITAMIN B-12)) injection 1,000 mcg  1,000 mcg Intramuscular Q30 days Mardella Layman, MD        Allergies  Allergen Reactions  . Codeine Hives  . Sulfonamide Derivatives Hives    Past Medical History  Diagnosis Date  . Esophageal reflux   . Hiatal hernia   . Unspecified gastritis and gastroduodenitis without mention of hemorrhage   . Esophageal stricture   . Type II or unspecified type diabetes mellitus without mention of complication, not stated as uncontrolled   . Fibromyalgia   . Hypertension   . Anxiety   . Depression   . Sleep apnea     CPAP  . Kidney stones   . Hypercholesterolemia   .  Thyroid disease   . IBS (irritable bowel syndrome)   . Breast cancer   . Cataract   . Cataract     Past Surgical History  Procedure Date  . Cardiac catheterization   . Total abdominal hysterectomy   . Appendectomy   . Cystocele repair   . Rectocele repair   . Thyroidectomy   . Breast lumpectomy     left  . Cervical fusion   . Cholecystectomy   . Tonsillectomy   . Cesarean section     Family History  Problem Relation Age of Onset  . Esophageal cancer Maternal Uncle   . Ovarian cancer Maternal Aunt   . Colon cancer Maternal Uncle   . Lung cancer Maternal Uncle   . Sarcoidosis Mother   . Lung cancer Father   . Diabetes Mother   . Diabetes Father   . Diabetes Maternal Uncle   . Diabetes Maternal Aunt   . Colon polyps Maternal Uncle   . Heart disease Father   . Heart disease Paternal Grandmother   . Urolithiasis Father     History   Social History  . Marital Status: Married    Spouse Name: N/A    Number of  Children: 4  . Years of Education: N/A   Occupational History  . Retired Engineer, civil (consulting)    Social History Main Topics  . Smoking status: Never Smoker   . Smokeless tobacco: Never Used  . Alcohol Use: No  . Drug Use: No  . Sexually Active: Not on file   Other Topics Concern  . Not on file   Social History Narrative  . No narrative on file    ROS: Please see the HPI.  All other systems reviewed and negative.  PHYSICAL EXAM:  BP 119/80  Pulse 80  Ht 5\' 4"  (1.626 m)  Wt 185 lb (83.915 kg)  BMI 31.76 kg/m2  General: Well developed, well nourished, in no acute distress. Head:  Normocephalic and atraumatic. Neck: no JVD Lungs: Clear to auscultation and percussion. Heart: Normal S1 and S2.  No murmur, rubs or gallops.  Abdomen:  Normal bowel sounds; soft; non tender; no organomegaly Pulses: Pulses normal in all 4 extremities. Extremities: No clubbing or cyanosis. No edema. Neurologic: Alert and oriented x 3.  EKG:  NSR.  Delay in R wave progression, likely secondary to lead placement.    ASSESSMENT AND PLAN:

## 2012-01-17 ENCOUNTER — Ambulatory Visit (INDEPENDENT_AMBULATORY_CARE_PROVIDER_SITE_OTHER): Payer: Medicare Other | Admitting: Cardiology

## 2012-01-17 DIAGNOSIS — R002 Palpitations: Secondary | ICD-10-CM

## 2012-01-17 DIAGNOSIS — I2510 Atherosclerotic heart disease of native coronary artery without angina pectoris: Secondary | ICD-10-CM

## 2012-01-17 NOTE — Progress Notes (Signed)
Exercise Treadmill Test  Pre-Exercise Testing Evaluation Rhythm: normal sinus  Rate: 75   PR:  .13 QRS:  .08  QT:  .39 QTc: .43     Test  Exercise Tolerance Test Ordering MD: Shawnie Pons, MD  Interpreting MD:  Shawnie Pons, MD  Unique Test No: 1  Treadmill:  1  Indication for ETT: known ASHD  Contraindication to ETT: No   Stress Modality: exercise - treadmill  Cardiac Imaging Performed: non   Protocol: standard Bruce - maximal  Max BP:  171/70  Max MPHR (bpm):  152 85% MPR (bpm):  129  MPHR obtained (bpm):  136 % MPHR obtained:  90  Reached 85% MPHR (min:sec):  11:05 Total Exercise Time (min-sec):  11:46  Workload in METS:  7.0 Borg Scale: 15  Reason ETT Terminated:  fatigue    ST Segment Analysis At Rest: normal ST segments - no evidence of significant ST depression With Exercise: no evidence of significant ST depression  Other Information Arrhythmia:  No Angina during ETT:  None Quality of ETT:  diagnostic  ETT Interpretation:  normal - no evidence of ischemia by ST analysis  Comments: Patient exercised on the Modified Bruce and reached a diagnostic HR.  She had minor non specific ST and T changes, and no change with exercise.  She was able to walk eleven minutes without angina.    Recommendations: Continued observation.  Early follow up in four weeks.

## 2012-01-18 ENCOUNTER — Ambulatory Visit (INDEPENDENT_AMBULATORY_CARE_PROVIDER_SITE_OTHER): Payer: Medicare Other | Admitting: Gastroenterology

## 2012-01-18 DIAGNOSIS — E538 Deficiency of other specified B group vitamins: Secondary | ICD-10-CM

## 2012-01-21 ENCOUNTER — Encounter: Payer: Self-pay | Admitting: Cardiology

## 2012-02-05 DIAGNOSIS — R61 Generalized hyperhidrosis: Secondary | ICD-10-CM | POA: Insufficient documentation

## 2012-02-05 NOTE — Assessment & Plan Note (Signed)
These episodes are unclear.  I am not convinced we need to do a cath, but I do think it would be worthwhile to put an event monitor on Jeanne Kim, and also do a stress test to rule out high grade ischemia. She will need to work with her primary MD to make sure that her diabetes is better controlled.  Another consideration might be diabetic gastropathy.  We will see her for the treadmill, then make further assessments in her care.

## 2012-02-13 ENCOUNTER — Ambulatory Visit (INDEPENDENT_AMBULATORY_CARE_PROVIDER_SITE_OTHER)
Admission: RE | Admit: 2012-02-13 | Discharge: 2012-02-13 | Disposition: A | Discharge status: Home / Self Care | Payer: Medicare Other | Source: Ambulatory Visit | Attending: Cardiology | Admitting: Cardiology

## 2012-02-13 ENCOUNTER — Ambulatory Visit (INDEPENDENT_AMBULATORY_CARE_PROVIDER_SITE_OTHER): Payer: Medicare Other | Admitting: Cardiology

## 2012-02-13 ENCOUNTER — Encounter: Payer: Self-pay | Admitting: Cardiology

## 2012-02-13 VITALS — BP 126/64 | HR 64 | Ht 65.0 in | Wt 181.8 lb

## 2012-02-13 DIAGNOSIS — R55 Syncope and collapse: Secondary | ICD-10-CM

## 2012-02-13 DIAGNOSIS — S0990XA Unspecified injury of head, initial encounter: Secondary | ICD-10-CM

## 2012-02-13 DIAGNOSIS — R61 Generalized hyperhidrosis: Secondary | ICD-10-CM

## 2012-02-13 NOTE — Patient Instructions (Addendum)
Non-Cardiac CT scanning, (CAT scanning), is a noninvasive, special x-ray that produces cross-sectional images of the body using x-rays and a computer. CT scans help physicians diagnose and treat medical conditions. For some CT exams, a contrast material is used to enhance visibility in the area of the body being studied. CT scans provide greater clarity and reveal more details than regular x-ray exams.  (CT of Head)  You have been referred to EP for possible Loop Recorder due to Syncope.  You have been referred to Neurology for head trauma and syncope.  Your physician has recommended you make the following change in your medication: DECREASE Metoprolol Succinate to 50mg  take one-half tablet by mouth daily  Your physician has requested that you regularly monitor and record your blood pressure readings at home. Please use the same machine at the same time of day to check your readings and record them to bring to your follow-up visit.  Your physician recommends that you schedule a follow-up appointment in: 1 WEEK with Dr Riley Kill  Your physician has requested that you have an echocardiogram. Echocardiography is a painless test that uses sound waves to create images of your heart. It provides your doctor with information about the size and shape of your heart and how well your heart's chambers and valves are working. This procedure takes approximately one hour. There are no restrictions for this procedure.

## 2012-02-14 DIAGNOSIS — S0990XA Unspecified injury of head, initial encounter: Secondary | ICD-10-CM | POA: Insufficient documentation

## 2012-02-14 NOTE — Progress Notes (Addendum)
t HPI:  This returns in followup. Returns today with some significant issues. Patient was recently at the cancer center at [facility]. The patient had previous mastectomy in 2010 followed by chemotherapy in 2011 and subsequent radiation therapy. She's had episodes associated with sweating, nausea and dizziness. She also had one of these episodes occurred while she was visiting at [facility] recently in the clinic. She got up had staggering gait, and they did further studies.  She had several small nonspecific foci of increased signal within the white matter ther circle of Willis MRA did not demonstrate any aneurysms or high-grade stenosis e is no evidence of intracranial hemorrhage or acute infarct there is no abnormal enhancement.  Her glucose was 105 at the time. Her liver functions were normal her TSH was normal her hematocrit was stable at 37.4 and platelet count was 311. Etiology of this was unclear.  This past week she had a bad episode of vertigo where she had it all day long.   She did not take her pulse but said this was different.  Sunday she became nauseated walking to the kitchen and next she knew she was on the floor.  She had left orbit and facial trauma, but refused to come to the ER.  Her event monitor to date has been negative.  She is not sure what trigger this.   Results of labs and MRI from [facility] reviewed and sent for file to chart.   Current Outpatient Prescriptions  Medication Sig Dispense Refill  . ALPRAZolam (XANAX) 0.5 MG tablet Take 0.5 mg by mouth at bedtime.      Marland Kitchen anastrozole (ARIMIDEX) 1 MG tablet Take 1 mg by mouth daily.      Marland Kitchen aspirin 325 MG tablet Take 325 mg by mouth daily.      Marland Kitchen atorvastatin (LIPITOR) 20 MG tablet Take 1 tablet (20 mg total) by mouth daily.  90 tablet  3  . calcium-vitamin D (OSCAL WITH D) 500-200 MG-UNIT per tablet Take 1 tablet by mouth 2 (two) times daily.      . Coenzyme Q10 (CO Q-10) 100 MG CAPS Take 100 mg by mouth daily.      . cyanocobalamin (,VITAMIN  B-12,) 1000 MCG/ML injection Inject 1,000 mcg into the muscle every 30 (thirty) days.      . DULoxetine (CYMBALTA) 60 MG capsule Take 60 mg by mouth daily.      . fluticasone (FLONASE) 50 MCG/ACT nasal spray Place 2 sprays into the nose daily.      Marland Kitchen glyBURIDE-metformin (GLUCOVANCE) 5-500 MG per tablet Take 2 tablets by mouth 2 (two) times daily with a meal.      . insulin detemir (LEVEMIR) 100 UNIT/ML injection Inject 20 Units into the skin at bedtime. Sliding scale      . insulin glulisine (APIDRA) 100 UNIT/ML injection Inject 8 Units into the skin at bedtime.      Marland Kitchen levothyroxine (SYNTHROID, LEVOTHROID) 112 MCG tablet Take 112 mcg by mouth daily.      . meclizine (ANTIVERT) 25 MG tablet Take 25 mg by mouth 3 (three) times daily as needed.      . metoprolol succinate (TOPROL-XL) 25 MG 24 hr tablet Take 1 tablet (25 mg total) by mouth at bedtime. Take with or immediately following a meal.      . montelukast (SINGULAIR) 10 MG tablet Take 10 mg by mouth at bedtime.      . RABEprazole (ACIPHEX) 20 MG tablet Take 1 tablet (20 mg total)  by mouth daily.  30 tablet  1  . ramipril (ALTACE) 10 MG capsule Take 10 mg by mouth daily.      Marland Kitchen triamterene-hydrochlorothiazide (MAXZIDE-25) 37.5-25 MG per tablet Take 1 tablet by mouth daily.       Current Facility-Administered Medications  Medication Dose Route Frequency Provider Last Rate Last Dose  . cyanocobalamin ((VITAMIN B-12)) injection 1,000 mcg  1,000 mcg Intramuscular Q30 days Mardella Layman, MD   1,000 mcg at 01/18/12 1013    Allergies  Allergen Reactions  . Codeine Hives  . Sulfonamide Derivatives Hives    Past Medical History  Diagnosis Date  . Esophageal reflux   . Hiatal hernia   . Unspecified gastritis and gastroduodenitis without mention of hemorrhage   . Esophageal stricture   . Type II or unspecified type diabetes mellitus without mention of complication, not stated as uncontrolled   . Fibromyalgia   . Hypertension   . Anxiety    . Depression   . Sleep apnea     CPAP  . Kidney stones   . Hypercholesterolemia   . Thyroid disease   . IBS (irritable bowel syndrome)   . Breast cancer   . Cataract   . Cataract     Past Surgical History  Procedure Date  . Cardiac catheterization   . Total abdominal hysterectomy   . Appendectomy   . Cystocele repair   . Rectocele repair   . Thyroidectomy   . Breast lumpectomy     left  . Cervical fusion   . Cholecystectomy   . Tonsillectomy   . Cesarean section     Family History  Problem Relation Age of Onset  . Esophageal cancer Maternal Uncle   . Ovarian cancer Maternal Aunt   . Colon cancer Maternal Uncle   . Lung cancer Maternal Uncle   . Sarcoidosis Mother   . Lung cancer Father   . Diabetes Mother   . Diabetes Father   . Diabetes Maternal Uncle   . Diabetes Maternal Aunt   . Colon polyps Maternal Uncle   . Heart disease Father   . Heart disease Paternal Grandmother   . Urolithiasis Father     History   Social History  . Marital Status: Married    Spouse Name: N/A    Number of Children: 4  . Years of Education: N/A   Occupational History  . Retired Engineer, civil (consulting)    Social History Main Topics  . Smoking status: Never Smoker   . Smokeless tobacco: Never Used  . Alcohol Use: No  . Drug Use: No  . Sexually Active: Not on file   Other Topics Concern  . Not on file   Social History Narrative  . No narrative on file    ROS: Please see the HPI.  All other systems reviewed and negative.  PHYSICAL EXAM:  BP 126/64  Pulse 64  Ht 5\' 5"  (1.651 m)  Wt 181 lb 12.8 oz (82.464 kg)  BMI 30.25 kg/m2  Pressures by me:  110/70 supine  106/70 sitting  118/70 standing.    General: Well developed, well nourished, in no acute distress. Head:  Bruising over left face, and swelling and echymossis under and around left orbit.  No obvious head defects.   Neck: no JVD Lungs: Clear to auscultation and percussion. Heart: Normal S1 and S2.  Minimal SEM.     Abdomen:  Normal bowel sounds; soft; non tender; no organomegaly Pulses: Pulses normal in all 4 extremities. Extremities:  No clubbing or cyanosis. No edema. Neurologic: Alert and oriented x 3.  Normal Finger to nose.  No drift.  CN intact.  Normal MS.  Equal strength bilaterally.    EKG:  ASSESSMENT AND PLAN:

## 2012-02-14 NOTE — Assessment & Plan Note (Signed)
Not sure the etiology of these spells but they do not seem related a rhythm disorder.  We will get an echo and ddimer to complete the workup and rule out a more occult cause such as PE.  Also refer to EP  ? Loop recorder.  Close followup.

## 2012-02-14 NOTE — Assessment & Plan Note (Signed)
Will get a CT done today.

## 2012-02-15 ENCOUNTER — Ambulatory Visit (INDEPENDENT_AMBULATORY_CARE_PROVIDER_SITE_OTHER): Payer: Medicare Other | Admitting: Neurology

## 2012-02-15 ENCOUNTER — Encounter: Payer: Self-pay | Admitting: Neurology

## 2012-02-15 VITALS — BP 110/60 | HR 72 | Wt 185.0 lb

## 2012-02-15 DIAGNOSIS — I999 Unspecified disorder of circulatory system: Secondary | ICD-10-CM

## 2012-02-15 DIAGNOSIS — S0990XA Unspecified injury of head, initial encounter: Secondary | ICD-10-CM

## 2012-02-15 DIAGNOSIS — R55 Syncope and collapse: Secondary | ICD-10-CM

## 2012-02-15 NOTE — Patient Instructions (Addendum)
Go to the basement to have your labs drawn today.  Your MRA is scheduled at the [facility] right beside [facility]. The address is [address] [place]. Your appointment is Thursday, Feb 21, 2012 at 8:00 am.  Please arrive 15 minutes prior to your scheduled appointment. [phone].

## 2012-02-15 NOTE — Progress Notes (Signed)
Dear Dr. Sherryll Burger,  Thank you for having me see Jeanne Kim in consultation today at [facility] for her problem with spells of dizziness sometimes accompanied by sweating and nausea.  As you may recall, she is a 71 y.o. year old female with a history of breast cancer s/p chemotherapy as well as diabetes who has at least a year history of spells of dizziness when she stands up.  These typically last 5 minutes or so.  They typically occur after being sedentary for quite some time.  In the past few weeks these are accompanied by diaphoresis and nausea.  Sometimes the diaphoresis and nausea can come on before the spell.  She had one in April where she was diaphoretic and nauseated and stood up and then seemed dizzy and was walking "funny".  She was at [facility] so she had an MRI brain and MRA head that were reportedly unremarkable.  She had a recent spell in May when she had similar sensations but this time lost consciousness she thinks for a short time and fell and hit her face.  A CT head did not reveal any intracranial pathology.  She says that these spells of dizziness always follow sitting down for a long while.  She never has these sensations lying or with head movement.  She has had one spell of prolonged vertigo that started with a position change to standing. An event monitor has been unremarkable.  Past Medical History  Diagnosis Date  . Esophageal reflux   . Hiatal hernia   . Unspecified gastritis and gastroduodenitis without mention of hemorrhage   . Esophageal stricture   . Type II or unspecified type diabetes mellitus without mention of complication, not stated as uncontrolled   . Fibromyalgia   . Hypertension   . Anxiety   . Depression   . Sleep apnea     CPAP  . Kidney stones   . Hypercholesterolemia   . Thyroid disease   . IBS (irritable bowel syndrome)   . Breast cancer   . Cataract   . Cataract     Past Surgical History  Procedure Date  . Cardiac catheterization    . Total abdominal hysterectomy   . Appendectomy   . Cystocele repair   . Rectocele repair   . Thyroidectomy   . Breast lumpectomy     left  . Cervical fusion   . Cholecystectomy   . Tonsillectomy   . Cesarean section     History   Social History  . Marital Status: Married    Spouse Name: N/A    Number of Children: 4  . Years of Education: N/A   Occupational History  . Retired Engineer, civil (consulting)    Social History Main Topics  . Smoking status: Never Smoker   . Smokeless tobacco: Never Used  . Alcohol Use: No  . Drug Use: No  . Sexually Active: None   Other Topics Concern  . None   Social History Narrative  . None    Family History  Problem Relation Age of Onset  . Esophageal cancer Maternal Uncle   . Ovarian cancer Maternal Aunt   . Colon cancer Maternal Uncle   . Lung cancer Maternal Uncle   . Sarcoidosis Mother   . Lung cancer Father   . Diabetes Mother   . Diabetes Father   . Diabetes Maternal Uncle   . Diabetes Maternal Aunt   . Colon polyps Maternal Uncle   . Heart disease Father   .  Heart disease Paternal Grandmother   . Urolithiasis Father     Current Outpatient Prescriptions on File Prior to Visit  Medication Sig Dispense Refill  . ALPRAZolam (XANAX) 0.5 MG tablet Take 0.5 mg by mouth at bedtime.      Marland Kitchen anastrozole (ARIMIDEX) 1 MG tablet Take 1 mg by mouth daily.      Marland Kitchen aspirin 325 MG tablet Take 325 mg by mouth daily.      Marland Kitchen atorvastatin (LIPITOR) 20 MG tablet Take 1 tablet (20 mg total) by mouth daily.  90 tablet  3  . calcium-vitamin D (OSCAL WITH D) 500-200 MG-UNIT per tablet Take 1 tablet by mouth 2 (two) times daily.      . Coenzyme Q10 (CO Q-10) 100 MG CAPS Take 100 mg by mouth daily.      . cyanocobalamin (,VITAMIN B-12,) 1000 MCG/ML injection Inject 1,000 mcg into the muscle every 30 (thirty) days.      . DULoxetine (CYMBALTA) 60 MG capsule Take 60 mg by mouth daily.      . fluticasone (FLONASE) 50 MCG/ACT nasal spray Place 2 sprays into the nose  daily.      Marland Kitchen glyBURIDE-metformin (GLUCOVANCE) 5-500 MG per tablet Take 2 tablets by mouth 2 (two) times daily with a meal.      . insulin detemir (LEVEMIR) 100 UNIT/ML injection Inject 20 Units into the skin at bedtime. Sliding scale      . insulin glulisine (APIDRA) 100 UNIT/ML injection Inject 8 Units into the skin at bedtime.      Marland Kitchen levothyroxine (SYNTHROID, LEVOTHROID) 112 MCG tablet Take 112 mcg by mouth daily.      . metoprolol succinate (TOPROL-XL) 25 MG 24 hr tablet Take 1 tablet (25 mg total) by mouth at bedtime. Take with or immediately following a meal.      . montelukast (SINGULAIR) 10 MG tablet Take 10 mg by mouth at bedtime.      . RABEprazole (ACIPHEX) 20 MG tablet Take 1 tablet (20 mg total) by mouth daily.  30 tablet  1  . ramipril (ALTACE) 10 MG capsule Take 10 mg by mouth daily.      Marland Kitchen triamterene-hydrochlorothiazide (MAXZIDE-25) 37.5-25 MG per tablet Take 1 tablet by mouth daily.       Current Facility-Administered Medications on File Prior to Visit  Medication Dose Route Frequency Provider Last Rate Last Dose  . cyanocobalamin ((VITAMIN B-12)) injection 1,000 mcg  1,000 mcg Intramuscular Q30 days Mardella Layman, MD   1,000 mcg at 01/18/12 1013    Allergies  Allergen Reactions  . Codeine Hives  . Sulfonamide Derivatives Hives      ROS:  13 systems were reviewed and is notable for vitreal disease in the right eye are unremarkable.   Examination:  Filed Vitals:   02/15/12 1211  BP: 110/60  Pulse: 72  Weight: 185 lb (83.915 kg)   BP checked lying and standing with no significant change.  In general, well appearing older women.  Cardiovascular: The patient has a regular rate and rhythm and no carotid bruits.  Fundoscopy:  Disks are flat. Vessel caliber within normal limits.  Mental status:   The patient is oriented to person, place and time. Recent and remote memory are intact. Attention span and concentration are normal. Language including repetition,  naming, following commands are intact. Fund of knowledge of current and historical events, as well as vocabulary are normal.  Cranial Nerves: Pupils are equally round and reactive to light. Visual fields  full to confrontation. Extraocular movements are intact without nystagmus. Facial sensation and muscles of mastication are intact. Muscles of facial expression are symmetric. Hearing intact to bilateral finger rub. Tongue protrusion, uvula, palate midline.  Shoulder shrug intact  Motor:  The patient has normal bulk and tone, no pronator drift.  There are no adventitious movements.  5/5 muscle strength bilaterally.  Reflexes:   Biceps  Triceps Brachioradialis Knee Ankle  Right 2+  2+  2+   2+ 1+  Left  0  2+  0   2+ 0  Toes down  Coordination:  Normal finger to nose.  No dysdiadokinesia.  Sensation is mildly decreased to vibration in the toes as well as to temperature, position intact.  Gait and Station are normal. Romberg is negative   Impression/Recs: 1.  Spells of dizziness - I think this is due to orthostatic blood pressure drops, despite that our orthostatic bp measurement today was normal(but she was asymptomatic and has been everytime her orthostatic bp was checked).  The story is consistent with autonomic dysfunction - possibly due to an autonomic neuropathy - spells brought on by moving to standing when sitting for a long time.  The diaphoresis and nausea are likely a symptom of this as well.  I would advocate aggressively backing off her BP meds to see if we can make this better - at least from a diagnostic point of view this will give [place] more information.   2.  One spell of vertigo - Likely a related phenomenon, but given they did not due a neck MRA at [facility] I would advocate getting that in order to rule out a posterior circ stenosis.   We will see the patient back in 2 months.  Thank you for having [place] see Jeanne Kim in consultation.  Feel free to contact me with any  questions.  Lupita Raider Modesto Charon, MD [facility], [place] [address] [place], [place] [address] Phone: [phone] Fax: [phone].

## 2012-02-17 DIAGNOSIS — R55 Syncope and collapse: Secondary | ICD-10-CM | POA: Insufficient documentation

## 2012-02-19 ENCOUNTER — Ambulatory Visit (INDEPENDENT_AMBULATORY_CARE_PROVIDER_SITE_OTHER): Payer: Medicare Other | Admitting: Gastroenterology

## 2012-02-19 ENCOUNTER — Ambulatory Visit (HOSPITAL_COMMUNITY): Payer: Medicare Other | Attending: Cardiology

## 2012-02-19 ENCOUNTER — Encounter: Payer: Self-pay | Admitting: Cardiology

## 2012-02-19 ENCOUNTER — Other Ambulatory Visit: Payer: Self-pay

## 2012-02-19 ENCOUNTER — Ambulatory Visit (INDEPENDENT_AMBULATORY_CARE_PROVIDER_SITE_OTHER): Payer: Medicare Other | Admitting: Cardiology

## 2012-02-19 VITALS — BP 108/70 | HR 70 | Ht 65.0 in | Wt 186.8 lb

## 2012-02-19 DIAGNOSIS — E119 Type 2 diabetes mellitus without complications: Secondary | ICD-10-CM

## 2012-02-19 DIAGNOSIS — R55 Syncope and collapse: Secondary | ICD-10-CM

## 2012-02-19 DIAGNOSIS — G4733 Obstructive sleep apnea (adult) (pediatric): Secondary | ICD-10-CM

## 2012-02-19 DIAGNOSIS — E669 Obesity, unspecified: Secondary | ICD-10-CM | POA: Insufficient documentation

## 2012-02-19 DIAGNOSIS — I2510 Atherosclerotic heart disease of native coronary artery without angina pectoris: Secondary | ICD-10-CM

## 2012-02-19 DIAGNOSIS — C50919 Malignant neoplasm of unspecified site of unspecified female breast: Secondary | ICD-10-CM | POA: Insufficient documentation

## 2012-02-19 DIAGNOSIS — I10 Essential (primary) hypertension: Secondary | ICD-10-CM | POA: Insufficient documentation

## 2012-02-19 DIAGNOSIS — E538 Deficiency of other specified B group vitamins: Secondary | ICD-10-CM

## 2012-02-19 LAB — BASIC METABOLIC PANEL
CO2: 31 mEq/L (ref 19–32)
Calcium: 8.8 mg/dL (ref 8.4–10.5)
Creatinine, Ser: 0.5 mg/dL (ref 0.4–1.2)
GFR: 130.07 mL/min (ref >60.00)
Glucose, Bld: 125 mg/dL — ABNORMAL HIGH (ref 70–99)

## 2012-02-19 NOTE — Assessment & Plan Note (Signed)
No ischemic sounding chest pain.

## 2012-02-19 NOTE — Assessment & Plan Note (Addendum)
Today she is a little bit orthostatic.  Will stop her diuretic, check BMET, and get d-dimer.  Doubt PE clinically, and unless high, would not act as RV does not look enlarged by echo.  ? If hyperglycemia is driving subtle volume loss, and I have encouraged her to watch her BP. We will see her back in two weeks.

## 2012-02-19 NOTE — Progress Notes (Signed)
HPI:  She has been stable.  She perhaps has been a little lightheaded when getting up.  We reviewed her echo findings in detail today.  The RV does not appear enlarged.  Her sugars are running a little bit higher.  No chest pain.  No swelling in the feet.    Current Outpatient Prescriptions  Medication Sig Dispense Refill  . ALPRAZolam (XANAX) 0.5 MG tablet Take 0.5 mg by mouth at bedtime.      Marland Kitchen anastrozole (ARIMIDEX) 1 MG tablet Take 1 mg by mouth daily.      Marland Kitchen aspirin 325 MG tablet Take 325 mg by mouth daily.      Marland Kitchen atorvastatin (LIPITOR) 20 MG tablet Take 1 tablet (20 mg total) by mouth daily.  90 tablet  3  . calcium-vitamin D (OSCAL WITH D) 500-200 MG-UNIT per tablet Take 1 tablet by mouth 2 (two) times daily.      . Coenzyme Q10 (CO Q-10) 100 MG CAPS Take 100 mg by mouth daily.      . cyanocobalamin (,VITAMIN B-12,) 1000 MCG/ML injection Inject 1,000 mcg into the muscle every 30 (thirty) days.      . DULoxetine (CYMBALTA) 60 MG capsule Take 60 mg by mouth daily.      . fluticasone (FLONASE) 50 MCG/ACT nasal spray Place 2 sprays into the nose daily.      Marland Kitchen glyBURIDE-metformin (GLUCOVANCE) 5-500 MG per tablet Take 2 tablets by mouth 2 (two) times daily with a meal.      . insulin detemir (LEVEMIR) 100 UNIT/ML injection Inject 20 Units into the skin at bedtime. Sliding scale      . insulin glulisine (APIDRA) 100 UNIT/ML injection Inject 8 Units into the skin at bedtime.      Marland Kitchen levothyroxine (SYNTHROID, LEVOTHROID) 112 MCG tablet Take 112 mcg by mouth daily.      . metoprolol succinate (TOPROL-XL) 25 MG 24 hr tablet Take 1 tablet (25 mg total) by mouth at bedtime. Take with or immediately following a meal.      . montelukast (SINGULAIR) 10 MG tablet Take 10 mg by mouth at bedtime.      . RABEprazole (ACIPHEX) 20 MG tablet Take 1 tablet (20 mg total) by mouth daily.  30 tablet  1  . ramipril (ALTACE) 10 MG capsule Take 10 mg by mouth daily.      Marland Kitchen triamterene-hydrochlorothiazide  (MAXZIDE-25) 37.5-25 MG per tablet Take 1 tablet by mouth daily.       Current Facility-Administered Medications  Medication Dose Route Frequency Provider Last Rate Last Dose  . cyanocobalamin ((VITAMIN B-12)) injection 1,000 mcg  1,000 mcg Intramuscular Q30 days Mardella Layman, MD   1,000 mcg at 01/18/12 1013    Allergies  Allergen Reactions  . Codeine Hives  . Sulfonamide Derivatives Hives    Past Medical History  Diagnosis Date  . Esophageal reflux   . Hiatal hernia   . Unspecified gastritis and gastroduodenitis without mention of hemorrhage   . Esophageal stricture   . Type II or unspecified type diabetes mellitus without mention of complication, not stated as uncontrolled   . Fibromyalgia   . Hypertension   . Anxiety   . Depression   . Sleep apnea     CPAP  . Kidney stones   . Hypercholesterolemia   . Thyroid disease   . IBS (irritable bowel syndrome)   . Breast cancer   . Cataract   . Cataract     Past Surgical  History  Procedure Date  . Cardiac catheterization   . Total abdominal hysterectomy   . Appendectomy   . Cystocele repair   . Rectocele repair   . Thyroidectomy   . Breast lumpectomy     left  . Cervical fusion   . Cholecystectomy   . Tonsillectomy   . Cesarean section     Family History  Problem Relation Age of Onset  . Esophageal cancer Maternal Uncle   . Ovarian cancer Maternal Aunt   . Colon cancer Maternal Uncle   . Lung cancer Maternal Uncle   . Sarcoidosis Mother   . Lung cancer Father   . Diabetes Mother   . Diabetes Father   . Diabetes Maternal Uncle   . Diabetes Maternal Aunt   . Colon polyps Maternal Uncle   . Heart disease Father   . Heart disease Paternal Grandmother   . Urolithiasis Father     History   Social History  . Marital Status: Married    Spouse Name: N/A    Number of Children: 4  . Years of Education: N/A   Occupational History  . Retired Engineer, civil (consulting)    Social History Main Topics  . Smoking status:  Never Smoker   . Smokeless tobacco: Never Used  . Alcohol Use: No  . Drug Use: No  . Sexually Active: Not on file   Other Topics Concern  . Not on file   Social History Narrative  . No narrative on file    ROS: Please see the HPI.  All other systems reviewed and negative.  PHYSICAL EXAM:  BP 108/70  Pulse 70  Ht 5\' 5"  (1.651 m)  Wt 186 lb 12.8 oz (84.732 kg)  BMI 31.09 kg/m2  BP  140 systolic.  110 sitting.    General: Well developed, well nourished, in no acute distress. Head:  Normocephalic and atraumatic. Neck: no JVD Lungs: Clear to auscultation and percussion. Heart: Normal S1 and S2.  Minimal SEM.  I do not hear AI.  Pulses: Pulses normal in all 4 extremities. Extremities: No clubbing or cyanosis. No edema. Neurologic: Alert and oriented x 3.  EKG:  NSR.  Non specific T flattening.  No acute changes.    ASSESSMENT AND PLAN:

## 2012-02-19 NOTE — Assessment & Plan Note (Signed)
Does not use her CPAP  "supposed to"

## 2012-02-19 NOTE — Assessment & Plan Note (Signed)
Probably needs better control.

## 2012-02-19 NOTE — Patient Instructions (Addendum)
Your physician recommends that you have lab work today: BMP and D-dimer  Your physician has recommended you make the following change in your medication: STOP Triamterene HCTZ  Your physician recommends that you schedule a follow-up appointment in: 2 WEEKS with Dr Riley Kill

## 2012-02-21 ENCOUNTER — Ambulatory Visit (HOSPITAL_COMMUNITY)
Admission: RE | Admit: 2012-02-21 | Discharge: 2012-02-21 | Disposition: A | Discharge status: Home / Self Care | Payer: Medicare Other | Source: Ambulatory Visit | Attending: Neurology | Admitting: Neurology

## 2012-02-21 ENCOUNTER — Other Ambulatory Visit: Payer: Medicare Other

## 2012-02-21 DIAGNOSIS — Z853 Personal history of malignant neoplasm of breast: Secondary | ICD-10-CM | POA: Insufficient documentation

## 2012-02-21 DIAGNOSIS — I999 Unspecified disorder of circulatory system: Secondary | ICD-10-CM

## 2012-02-21 DIAGNOSIS — R42 Dizziness and giddiness: Secondary | ICD-10-CM | POA: Insufficient documentation

## 2012-02-21 DIAGNOSIS — S0990XA Unspecified injury of head, initial encounter: Secondary | ICD-10-CM

## 2012-02-21 DIAGNOSIS — R55 Syncope and collapse: Secondary | ICD-10-CM

## 2012-02-21 DIAGNOSIS — I6529 Occlusion and stenosis of unspecified carotid artery: Secondary | ICD-10-CM | POA: Insufficient documentation

## 2012-02-21 DIAGNOSIS — X58XXXA Exposure to other specified factors, initial encounter: Secondary | ICD-10-CM | POA: Insufficient documentation

## 2012-02-21 MED ORDER — GADOBENATE DIMEGLUMINE 529 MG/ML IV SOLN
20.0000 mL | Freq: Once | INTRAVENOUS | Status: AC | PRN
  Administered 2012-02-21: 17 mL via INTRAVENOUS

## 2012-02-23 ENCOUNTER — Other Ambulatory Visit: Payer: Self-pay | Admitting: Gastroenterology

## 2012-03-01 LAB — CATECHOLAMINES, FRACTIONATED, URINE, 24 HOUR
Creatinine, Urine mg/day-CATEUR: 0.2 g/(24.h) — ABNORMAL LOW (ref 0.63–2.50)
Dopamine, 24 hr Urine: 33 mcg/24 h — ABNORMAL LOW (ref 52–480)

## 2012-03-02 LAB — METANEPHRINES, URINE, 24 HOUR
Metaneph Total, Ur: 66 mcg/24 h — ABNORMAL LOW (ref 224–832)
Metanephrines, Ur: 9 mcg/24 h — ABNORMAL LOW (ref 90–315)
Normetanephrine, 24H Ur: 57 mcg/24 h — ABNORMAL LOW (ref 122–676)

## 2012-03-05 ENCOUNTER — Encounter: Payer: Self-pay | Admitting: Cardiology

## 2012-03-05 ENCOUNTER — Ambulatory Visit (INDEPENDENT_AMBULATORY_CARE_PROVIDER_SITE_OTHER): Payer: Medicare Other | Admitting: Cardiology

## 2012-03-05 VITALS — BP 120/68 | HR 80 | Ht 65.0 in | Wt 185.0 lb

## 2012-03-05 DIAGNOSIS — R55 Syncope and collapse: Secondary | ICD-10-CM

## 2012-03-05 DIAGNOSIS — E119 Type 2 diabetes mellitus without complications: Secondary | ICD-10-CM

## 2012-03-05 NOTE — Assessment & Plan Note (Signed)
She is scheduled to see Dr. Theone Stanley tomorrow at [facility].

## 2012-03-05 NOTE — Assessment & Plan Note (Signed)
The patient seems clinically improved. I've encouraged her remain quite vigilant about drinking water and maintaining her level of hydration. She will also discuss with endocrinology the potential for improving her glucose control as this may be at the root of some level of volume contraction. Today, she has had no identifiable arrhythmia, and certainly does not have substrate for this. We will continue to monitor her closely. I will see Jeanne Kim back within the month.

## 2012-03-05 NOTE — Progress Notes (Signed)
HPI:  The patient returns for followup today. Clinically she is actually doing quite a bit better. She's been attempting to maintain good hydration. She is scheduled to see the endocrinologist tomorrow, and she will discuss with them measures to improve her sugar control. Her hemoglobin A1c has been somewhat higher. She denies any chest pain. She's had only one brief episode of feeling lightheaded. She has seen Dr. Regino Schultze and an MRI has been performed. There was some signal dropout and vertebral, and some atherosclerotic change in the carotid. Importantly, she feels improved.  Current Outpatient Prescriptions  Medication Sig Dispense Refill  . ACIPHEX 20 MG tablet TAKE 1 TABLET EVERY DAY  30 tablet  3  . ALPRAZolam (XANAX) 0.5 MG tablet Take 0.5 mg by mouth at bedtime.      Marland Kitchen anastrozole (ARIMIDEX) 1 MG tablet Take 1 mg by mouth daily.      Marland Kitchen aspirin 325 MG tablet Take 325 mg by mouth daily.      Marland Kitchen atorvastatin (LIPITOR) 20 MG tablet Take 1 tablet (20 mg total) by mouth daily.  90 tablet  3  . calcium-vitamin D (OSCAL WITH D) 500-200 MG-UNIT per tablet Take 1 tablet by mouth 2 (two) times daily.      . Coenzyme Q10 (CO Q-10) 100 MG CAPS Take 100 mg by mouth daily.      . cyanocobalamin (,VITAMIN B-12,) 1000 MCG/ML injection Inject 1,000 mcg into the muscle every 30 (thirty) days.      . diclofenac (VOLTAREN) 75 MG EC tablet Take by mouth 2 (two) times daily.       . DULoxetine (CYMBALTA) 60 MG capsule Take 60 mg by mouth daily.      . fluticasone (FLONASE) 50 MCG/ACT nasal spray Place 2 sprays into the nose daily.      Marland Kitchen glyBURIDE-metformin (GLUCOVANCE) 5-500 MG per tablet Take 2 tablets by mouth 2 (two) times daily with a meal.      . insulin detemir (LEVEMIR) 100 UNIT/ML injection Inject 20 Units into the skin at bedtime. Sliding scale      . insulin glulisine (APIDRA) 100 UNIT/ML injection Inject 8 Units into the skin at bedtime.      Marland Kitchen levothyroxine (SYNTHROID, LEVOTHROID) 112 MCG tablet Take  112 mcg by mouth daily.      . metoprolol succinate (TOPROL-XL) 25 MG 24 hr tablet Take 1 tablet (25 mg total) by mouth at bedtime. Take with or immediately following a meal.      . montelukast (SINGULAIR) 10 MG tablet Take 10 mg by mouth at bedtime.      . ramipril (ALTACE) 10 MG capsule Take 10 mg by mouth daily.       Current Facility-Administered Medications  Medication Dose Route Frequency Provider Last Rate Last Dose  . cyanocobalamin ((VITAMIN B-12)) injection 1,000 mcg  1,000 mcg Intramuscular Q30 days Mardella Layman, MD   1,000 mcg at 02/19/12 1141    Allergies  Allergen Reactions  . Codeine Hives  . Sulfonamide Derivatives Hives    Past Medical History  Diagnosis Date  . Esophageal reflux   . Hiatal hernia   . Unspecified gastritis and gastroduodenitis without mention of hemorrhage   . Esophageal stricture   . Type II or unspecified type diabetes mellitus without mention of complication, not stated as uncontrolled   . Fibromyalgia   . Hypertension   . Anxiety   . Depression   . Sleep apnea     CPAP  .  Kidney stones   . Hypercholesterolemia   . Thyroid disease   . IBS (irritable bowel syndrome)   . Breast cancer   . Cataract   . Cataract     Past Surgical History  Procedure Date  . Cardiac catheterization   . Total abdominal hysterectomy   . Appendectomy   . Cystocele repair   . Rectocele repair   . Thyroidectomy   . Breast lumpectomy     left  . Cervical fusion   . Cholecystectomy   . Tonsillectomy   . Cesarean section     Family History  Problem Relation Age of Onset  . Esophageal cancer Maternal Uncle   . Ovarian cancer Maternal Aunt   . Colon cancer Maternal Uncle   . Lung cancer Maternal Uncle   . Sarcoidosis Mother   . Lung cancer Father   . Diabetes Mother   . Diabetes Father   . Diabetes Maternal Uncle   . Diabetes Maternal Aunt   . Colon polyps Maternal Uncle   . Heart disease Father   . Heart disease Paternal Grandmother   .  Urolithiasis Father     History   Social History  . Marital Status: Married    Spouse Name: N/A    Number of Children: 4  . Years of Education: N/A   Occupational History  . Retired Engineer, civil (consulting)    Social History Main Topics  . Smoking status: Never Smoker   . Smokeless tobacco: Never Used  . Alcohol Use: No  . Drug Use: No  . Sexually Active: Not on file   Other Topics Concern  . Not on file   Social History Narrative  . No narrative on file    ROS: Please see the HPI.  All other systems reviewed and negative.  PHYSICAL EXAM:  BP 120/68  Pulse 80  Ht 5\' 5"  (1.651 m)  Wt 185 lb (83.915 kg)  BMI 30.79 kg/m2  140 supine and 125 standing.    General: Well developed, well nourished, in no acute distress. Head:  Normocephalic and atraumatic. Neck: no JVD Lungs: Clear to auscultation and percussion. Heart: Normal S1 and S2.  No murmur, rubs or gallops.  Pulses: Pulses normal in all 4 extremities. Extremities: No clubbing or cyanosis. No edema. Neurologic: Alert and oriented x 3.  EKG:  ASSESSMENT AND PLAN:

## 2012-03-05 NOTE — Patient Instructions (Signed)
Your physician recommends that you schedule a follow-up appointment in: 4 WEEKS  Your physician recommends that you continue on your current medications as directed. Please refer to the Current Medication list given to you today.   

## 2012-03-06 ENCOUNTER — Telehealth: Payer: Self-pay | Admitting: Neurology

## 2012-03-06 NOTE — Telephone Encounter (Signed)
Spoke with the patient. Information given as per Dr. Wong re: MRA and normal ua. The patient has seen her cardiologist and her endocrinologist. She is aware of her f/u appointment with Dr. Wong. No additional questions or concerns at this time. 

## 2012-03-06 NOTE — Telephone Encounter (Signed)
Message copied by Benay Spice on Thu Mar 06, 2012  1:49 PM ------      Message from: Milas Gain      Created: Mon Mar 03, 2012 10:37 PM       Let Ms. Delong know that her MRA did show some atherosclerosis of the neck vessels but nothing that would cause her dizziness.

## 2012-03-06 NOTE — Telephone Encounter (Signed)
Spoke with the patient. Information given as per Dr. Modesto Charon re: MRA and normal ua. The patient has seen her cardiologist and her endocrinologist. She is aware of her f/u appointment with Dr. Modesto Charon. No additional questions or concerns at this time.

## 2012-03-06 NOTE — Telephone Encounter (Signed)
Message copied by Benay Spice on Thu Mar 06, 2012  1:51 PM ------      Message from: Milas Gain      Created: Mon Mar 03, 2012 10:38 PM       Also let her know that her urine tests were normal.

## 2012-03-14 ENCOUNTER — Encounter: Payer: Self-pay | Admitting: Internal Medicine

## 2012-03-14 ENCOUNTER — Ambulatory Visit (INDEPENDENT_AMBULATORY_CARE_PROVIDER_SITE_OTHER): Payer: Medicare Other | Admitting: Internal Medicine

## 2012-03-14 VITALS — BP 114/62 | HR 80 | Ht 65.0 in | Wt 188.8 lb

## 2012-03-14 DIAGNOSIS — R55 Syncope and collapse: Secondary | ICD-10-CM

## 2012-03-14 DIAGNOSIS — I10 Essential (primary) hypertension: Secondary | ICD-10-CM

## 2012-03-14 NOTE — Patient Instructions (Signed)
Call if you need us! 

## 2012-03-14 NOTE — Assessment & Plan Note (Signed)
The etiology of the patient's syncope is still unclear. I discussed the options of treatment with the patient. Because she's had only one episode of frank syncope and other episodes that sound more orthostatic or perhaps neurally mediated in nature, I recommended watchful waiting. Implantable loop recorder would likely be unrevealing because she's only had one episode of syncope. If she has a second episode then I think implantable loop recorder would be warranted. In the meantime, I've asked the patient to maintain an adequate state of hydration, and to sit or lie down if she feels an episode of dizziness or lightheadedness.

## 2012-03-14 NOTE — Progress Notes (Signed)
HPI [name] is referred today for evaluation of syncope. She is a very pleasant 71 year old woman, who looks younger than her stated age, with a history of coronary disease, dyslipidemia, diabetes, and hypertension. She has a history of orthostatic sounding symptoms in the past with recurrent episodes of dizziness and near syncope when she stood up quickly. She describes 1 episode of frank syncope occurring approximately one month ago. In this episode she was sitting and begin stand. She walked into the kitchen. She passed out. She denied associated loss of bowel or bladder continence. She denies vomiting. No nausea. There is no associated palpitation. She did not bite her tongue. She has not had any episodes recently like this. She worked cardiac monitor which was unrevealing. Allergies  Allergen Reactions  . Codeine Hives  . Sulfonamide Derivatives Hives     Current Outpatient Prescriptions  Medication Sig Dispense Refill  . ACIPHEX 20 MG tablet TAKE 1 TABLET EVERY DAY  30 tablet  3  . ALPRAZolam (XANAX) 0.5 MG tablet Take 0.5 mg by mouth at bedtime.      Marland Kitchen anastrozole (ARIMIDEX) 1 MG tablet Take 1 mg by mouth daily.      Marland Kitchen aspirin 325 MG tablet Take 325 mg by mouth daily.      Marland Kitchen atorvastatin (LIPITOR) 20 MG tablet Take 1 tablet (20 mg total) by mouth daily.  90 tablet  3  . calcium-vitamin D (OSCAL WITH D) 500-200 MG-UNIT per tablet Take 1 tablet by mouth 2 (two) times daily.      . Coenzyme Q10 (CO Q-10) 100 MG CAPS Take 100 mg by mouth daily.      . cyanocobalamin (,VITAMIN B-12,) 1000 MCG/ML injection Inject 1,000 mcg into the muscle every 30 (thirty) days.      . diclofenac (VOLTAREN) 75 MG EC tablet Take by mouth 2 (two) times daily.       . DULoxetine (CYMBALTA) 60 MG capsule Take 60 mg by mouth daily.      . fluticasone (FLONASE) 50 MCG/ACT nasal spray Place 2 sprays into the nose daily.      Marland Kitchen glyBURIDE-metformin (GLUCOVANCE) 5-500 MG per tablet Take 2 tablets by mouth 2 (two)  times daily with a meal.      . insulin detemir (LEVEMIR) 100 UNIT/ML injection Inject 20 Units into the skin at bedtime. Sliding scale      . insulin glulisine (APIDRA) 100 UNIT/ML injection Inject 7 Units into the skin 3 (three) times daily before meals.       Marland Kitchen levothyroxine (SYNTHROID, LEVOTHROID) 112 MCG tablet Take 112 mcg by mouth daily.      . metoprolol succinate (TOPROL-XL) 25 MG 24 hr tablet Take 1 tablet (25 mg total) by mouth at bedtime. Take with or immediately following a meal.      . montelukast (SINGULAIR) 10 MG tablet Take 10 mg by mouth at bedtime.      . ramipril (ALTACE) 10 MG capsule Take 10 mg by mouth daily.       Current Facility-Administered Medications  Medication Dose Route Frequency Provider Last Rate Last Dose  . cyanocobalamin ((VITAMIN B-12)) injection 1,000 mcg  1,000 mcg Intramuscular Q30 days Mardella Layman, MD   1,000 mcg at 02/19/12 1141     Past Medical History  Diagnosis Date  . Esophageal reflux   . Hiatal hernia   . Unspecified gastritis and gastroduodenitis without mention of hemorrhage   . Esophageal stricture   . Type II or unspecified  type diabetes mellitus without mention of complication, not stated as uncontrolled   . Fibromyalgia   . Hypertension   . Anxiety   . Depression   . Sleep apnea     CPAP  . Kidney stones   . Hypercholesterolemia   . Thyroid disease   . IBS (irritable bowel syndrome)   . Breast cancer   . Cataract   . Cataract     ROS:   All systems reviewed and negative except as noted in the HPI.   Past Surgical History  Procedure Date  . Cardiac catheterization   . Total abdominal hysterectomy   . Appendectomy   . Cystocele repair   . Rectocele repair   . Thyroidectomy   . Breast lumpectomy     left  . Cervical fusion   . Cholecystectomy   . Tonsillectomy   . Cesarean section      Family History  Problem Relation Age of Onset  . Esophageal cancer Maternal Uncle   . Ovarian cancer Maternal Aunt    . Colon cancer Maternal Uncle   . Lung cancer Maternal Uncle   . Sarcoidosis Mother   . Lung cancer Father   . Diabetes Mother   . Diabetes Father   . Diabetes Maternal Uncle   . Diabetes Maternal Aunt   . Colon polyps Maternal Uncle   . Heart disease Father   . Heart disease Paternal Grandmother   . Urolithiasis Father      History   Social History  . Marital Status: Married    Spouse Name: N/A    Number of Children: 4  . Years of Education: N/A   Occupational History  . Retired Engineer, civil (consulting)    Social History Main Topics  . Smoking status: Never Smoker   . Smokeless tobacco: Never Used  . Alcohol Use: No  . Drug Use: No  . Sexually Active: Not on file   Other Topics Concern  . Not on file   Social History Narrative  . No narrative on file     BP 114/62  Pulse 80  Ht 5\' 5"  (1.651 m)  Wt 188 lb 12.8 oz (85.639 kg)  BMI 31.42 kg/m2  Physical Exam:  Well appearing 71 year old woman, who looks younger than her stated age, and in NAD HEENT: Unremarkable Neck:  7 cm JVD, no thyromegally Lungs:  Clear with no wheezes, rales, or rhonchi. HEART:  Regular rate rhythm, no murmurs, no rubs, no clicks Abd:  soft, positive bowel sounds, no organomegally, no rebound, no guarding Ext:  2 plus pulses, no edema, no cyanosis, no clubbing Skin:  No rashes no nodules Neuro:  CN II through XII intact, motor grossly intact  EKG Normal sinus rhythm with poor R-wave progression Assess/Plan:

## 2012-03-14 NOTE — Assessment & Plan Note (Signed)
Her blood pressure today is well controlled. My experience has been the patient's who have a degree of orthostasis will often do better if there blood pressure is allowed to be a bit on the high side of normal.

## 2012-03-21 ENCOUNTER — Ambulatory Visit (INDEPENDENT_AMBULATORY_CARE_PROVIDER_SITE_OTHER): Payer: Medicare Other | Admitting: Gastroenterology

## 2012-03-21 DIAGNOSIS — E538 Deficiency of other specified B group vitamins: Secondary | ICD-10-CM

## 2012-03-26 ENCOUNTER — Encounter: Payer: Self-pay | Admitting: Cardiology

## 2012-04-08 ENCOUNTER — Ambulatory Visit: Payer: Medicare Other | Admitting: Cardiology

## 2012-04-21 ENCOUNTER — Encounter: Payer: Self-pay | Admitting: Neurology

## 2012-04-21 ENCOUNTER — Ambulatory Visit (INDEPENDENT_AMBULATORY_CARE_PROVIDER_SITE_OTHER): Payer: Medicare Other | Admitting: Gastroenterology

## 2012-04-21 ENCOUNTER — Ambulatory Visit (INDEPENDENT_AMBULATORY_CARE_PROVIDER_SITE_OTHER): Payer: Medicare Other | Admitting: Neurology

## 2012-04-21 VITALS — BP 102/62 | HR 80 | Ht 65.0 in | Wt 182.0 lb

## 2012-04-21 DIAGNOSIS — E538 Deficiency of other specified B group vitamins: Secondary | ICD-10-CM

## 2012-04-21 DIAGNOSIS — R55 Syncope and collapse: Secondary | ICD-10-CM

## 2012-04-21 NOTE — Progress Notes (Signed)
Dear Dr. Sherryll Burger,  I saw  Jeanne Kim back in [facility] for her problem with presyncope and syncope.  As you may recall, she is a 71 y.o. year old female with a history of diabetes and breast cancer s/p chemotherapy who is having problems of presyncope when she stands up.  She has not had any further spells of syncope however.  My feeling at her last visit is that she was suffering from orthostasis, likely a result of worsening autonomic neuropathy related to diabetes and perhaps chemotherapy, but complicated by the use of her blood pressure medications.  She has stopped her HCTZ and halfed her metoprolol.  However, if she still is sedentary for a prolonged length of time she still gets dizzy when she stands up.  She has tried to increase her fluid intake.  MRA neck revealed a possible proximal stenosis of her right vertebral artery, but the rest of the basilar circ looked ok.  There was a 50% proximal right ICA stenosis.   Medical history, social history, and family history were reviewed and have not changed since the last clinic visit.  Current Outpatient Prescriptions on File Prior to Visit  Medication Sig Dispense Refill  . ACIPHEX 20 MG tablet TAKE 1 TABLET EVERY DAY  30 tablet  3  . ALPRAZolam (XANAX) 0.5 MG tablet Take 0.5 mg by mouth at bedtime.      Marland Kitchen anastrozole (ARIMIDEX) 1 MG tablet Take 1 mg by mouth daily.      Marland Kitchen aspirin 325 MG tablet Take 325 mg by mouth daily.      Marland Kitchen atorvastatin (LIPITOR) 20 MG tablet Take 1 tablet (20 mg total) by mouth daily.  90 tablet  3  . calcium-vitamin D (OSCAL WITH D) 500-200 MG-UNIT per tablet Take 1 tablet by mouth 2 (two) times daily.      . Coenzyme Q10 (CO Q-10) 100 MG CAPS Take 100 mg by mouth daily.      . cyanocobalamin (,VITAMIN B-12,) 1000 MCG/ML injection Inject 1,000 mcg into the muscle every 30 (thirty) days.      . diclofenac (VOLTAREN) 75 MG EC tablet Take by mouth 2 (two) times daily.       . DULoxetine (CYMBALTA) 60 MG  capsule Take 60 mg by mouth daily.      . fluticasone (FLONASE) 50 MCG/ACT nasal spray Place 2 sprays into the nose daily.      Marland Kitchen glyBURIDE-metformin (GLUCOVANCE) 5-500 MG per tablet Take 2 tablets by mouth 2 (two) times daily with a meal.      . insulin detemir (LEVEMIR) 100 UNIT/ML injection Inject 20 Units into the skin at bedtime. Sliding scale      . insulin glulisine (APIDRA) 100 UNIT/ML injection Inject 7 Units into the skin 3 (three) times daily before meals.       Marland Kitchen levothyroxine (SYNTHROID, LEVOTHROID) 112 MCG tablet Take 112 mcg by mouth daily.      . metoprolol succinate (TOPROL-XL) 25 MG 24 hr tablet Take 1 tablet (25 mg total) by mouth at bedtime. Take with or immediately following a meal.      . montelukast (SINGULAIR) 10 MG tablet Take 10 mg by mouth at bedtime.      . ramipril (ALTACE) 10 MG capsule Take 10 mg by mouth daily.       Current Facility-Administered Medications on File Prior to Visit  Medication Dose Route Frequency Provider Last Rate Last Dose  . cyanocobalamin ((VITAMIN B-12)) injection 1,000  mcg  1,000 mcg Intramuscular Q30 days Mardella Layman, MD   1,000 mcg at 03/21/12 1629    Allergies  Allergen Reactions  . Codeine Hives  . Sulfonamide Derivatives Hives    ROS:  13 systems were reviewed and are unremarkable.  Exam: . Filed Vitals:   04/21/12 1045  BP: 102/62  Pulse: 80  Height: 5\' 5"  (1.651 m)  Weight: 182 lb (82.555 kg)   Impression/Recommendations:  1.  Orthostasis -Likely complicated by possible diabetic autonomic neuropathy or chemotherapy related neuropathy.   I think she still needs to back off some of her metoprolol or ramipril if possible, but obviously this has to be balanced against her bp control.   2.  R ICA proximal Carotid stenosis - would recommend following this with a repeat carotid doppler in 1 year. 3.  Right proximal Vertebral disease - I am suspicious this is artifactual.  In any case it would not be causing her  symptoms.   Lupita Raider Modesto Charon, MD [facility],

## 2012-06-03 ENCOUNTER — Ambulatory Visit (INDEPENDENT_AMBULATORY_CARE_PROVIDER_SITE_OTHER): Payer: Medicare Other | Admitting: Gastroenterology

## 2012-06-03 DIAGNOSIS — E538 Deficiency of other specified B group vitamins: Secondary | ICD-10-CM

## 2012-06-11 ENCOUNTER — Encounter: Payer: Self-pay | Admitting: Cardiology

## 2012-06-11 ENCOUNTER — Ambulatory Visit (INDEPENDENT_AMBULATORY_CARE_PROVIDER_SITE_OTHER): Payer: Medicare Other | Admitting: Cardiology

## 2012-06-11 VITALS — BP 115/65 | HR 72 | Ht 65.0 in | Wt 185.4 lb

## 2012-06-11 DIAGNOSIS — E119 Type 2 diabetes mellitus without complications: Secondary | ICD-10-CM

## 2012-06-11 DIAGNOSIS — I10 Essential (primary) hypertension: Secondary | ICD-10-CM

## 2012-06-11 DIAGNOSIS — I2510 Atherosclerotic heart disease of native coronary artery without angina pectoris: Secondary | ICD-10-CM

## 2012-06-11 NOTE — Assessment & Plan Note (Signed)
Would consider dropping her Altace to  5 mg.  She can monitor her BP.

## 2012-06-11 NOTE — Patient Instructions (Addendum)
Your physician wants you to follow-up in: January 2014 with Dr Riley Kill.  You will receive a reminder letter in the mail two months in advance. If you don't receive a letter, please call our office to schedule the follow-up appointment.  Your physician has requested that you have a carotid duplex in January 2014. This test is an ultrasound of the carotid arteries in your neck. It looks at blood flow through these arteries that supply the brain with blood. Allow one hour for this exam. There are no restrictions or special instructions.  Your physician recommends that you continue on your current medications as directed. Please refer to the Current Medication list given to you today.

## 2012-06-11 NOTE — Progress Notes (Signed)
HPI:  The patient returns today in followup. Overall, she has been relatively stable. She does note that she has not had any more episodes of nearly passing out. She does get dizzy little bit when she gets up. She has to push herself at times. She seems a bit down, in part related to the fact that she was caring for her grandchildren, and now they have made some alternative arrangements. She wants to be able redefine perhaps something that will keep her busy in a positive way. She does not seem to be a having a lot of chest pain.  She saw Dr. Modesto Charon with the following suggestions:  Impression/Recommendations:  1. Orthostasis -Likely complicated by possible diabetic autonomic neuropathy or chemotherapy related neuropathy. I think she still needs to back off some of her metoprolol or ramipril if possible, but obviously this has to be balanced against her bp control.  2. R ICA proximal Carotid stenosis - would recommend following this with a repeat carotid doppler in 1 year.  3. Right proximal Vertebral disease - I am suspicious this is artifactual. In any case it would not be causing her symptoms.  Lupita Raider Modesto Charon, MD  [facility], [place]      Current Outpatient Prescriptions  Medication Sig Dispense Refill  . ACIPHEX 20 MG tablet TAKE 1 TABLET EVERY DAY  30 tablet  3  . ALPRAZolam (XANAX) 0.5 MG tablet Take 0.5 mg by mouth at bedtime.      Marland Kitchen anastrozole (ARIMIDEX) 1 MG tablet Take 1 mg by mouth daily.      Marland Kitchen aspirin 325 MG tablet Take 325 mg by mouth daily.      Marland Kitchen atorvastatin (LIPITOR) 20 MG tablet Take 1 tablet (20 mg total) by mouth daily.  90 tablet  3  . calcium-vitamin D (OSCAL WITH D) 500-200 MG-UNIT per tablet Take 1 tablet by mouth 2 (two) times daily.      . Coenzyme Q10 (CO Q-10) 100 MG CAPS Take 100 mg by mouth daily.      . cyanocobalamin (,VITAMIN B-12,) 1000 MCG/ML injection Inject 1,000 mcg into the muscle every 30 (thirty) days.      . diclofenac (VOLTAREN) 75 MG EC  tablet Take by mouth 2 (two) times daily.       . DULoxetine (CYMBALTA) 60 MG capsule Take 60 mg by mouth daily.      . fluticasone (FLONASE) 50 MCG/ACT nasal spray Place 2 sprays into the nose daily.      Marland Kitchen glyBURIDE-metformin (GLUCOVANCE) 5-500 MG per tablet Take 2 tablets by mouth 2 (two) times daily with a meal.      . insulin detemir (LEVEMIR) 100 UNIT/ML injection Inject 20 Units into the skin at bedtime. Sliding scale      . insulin glulisine (APIDRA) 100 UNIT/ML injection Inject 7 Units into the skin as needed.       Marland Kitchen levothyroxine (SYNTHROID, LEVOTHROID) 112 MCG tablet Take 112 mcg by mouth daily.      . metoprolol succinate (TOPROL-XL) 25 MG 24 hr tablet Take 1 tablet (25 mg total) by mouth at bedtime. Take with or immediately following a meal.      . montelukast (SINGULAIR) 10 MG tablet Take 10 mg by mouth at bedtime.      . ramipril (ALTACE) 10 MG capsule Take 10 mg by mouth daily.       Current Facility-Administered Medications  Medication Dose Route Frequency Provider Last Rate Last Dose  . cyanocobalamin ((VITAMIN  B-12)) injection 1,000 mcg  1,000 mcg Intramuscular Q30 days Mardella Layman, MD   1,000 mcg at 06/03/12 1050    Allergies  Allergen Reactions  . Codeine Hives  . Sulfonamide Derivatives Hives    Past Medical History  Diagnosis Date  . Esophageal reflux   . Hiatal hernia   . Unspecified gastritis and gastroduodenitis without mention of hemorrhage   . Esophageal stricture   . Type II or unspecified type diabetes mellitus without mention of complication, not stated as uncontrolled   . Fibromyalgia   . Hypertension   . Anxiety   . Depression   . Sleep apnea     CPAP  . Kidney stones   . Hypercholesterolemia   . Thyroid disease   . IBS (irritable bowel syndrome)   . Breast cancer   . Cataract   . Cataract     Past Surgical History  Procedure Date  . Cardiac catheterization   . Total abdominal hysterectomy   . Appendectomy   . Cystocele repair     . Rectocele repair   . Thyroidectomy   . Breast lumpectomy     left  . Cervical fusion   . Cholecystectomy   . Tonsillectomy   . Cesarean section     Family History  Problem Relation Age of Onset  . Esophageal cancer Maternal Uncle   . Ovarian cancer Maternal Aunt   . Colon cancer Maternal Uncle   . Lung cancer Maternal Uncle   . Sarcoidosis Mother   . Lung cancer Father   . Diabetes Mother   . Diabetes Father   . Diabetes Maternal Uncle   . Diabetes Maternal Aunt   . Colon polyps Maternal Uncle   . Heart disease Father   . Heart disease Paternal Grandmother   . Urolithiasis Father     History   Social History  . Marital Status: Married    Spouse Name: N/A    Number of Children: 4  . Years of Education: N/A   Occupational History  . Retired Engineer, civil (consulting)    Social History Main Topics  . Smoking status: Never Smoker   . Smokeless tobacco: Never Used  . Alcohol Use: No  . Drug Use: No  . Sexually Active: Not on file   Other Topics Concern  . Not on file   Social History Narrative  . No narrative on file    ROS: Please see the HPI.  All other systems reviewed and negative.  PHYSICAL EXAM:  BP 115/65  Pulse 72  Ht 5\' 5"  (1.651 m)  Wt 185 lb 6.4 oz (84.097 kg)  BMI 30.85 kg/m2  General: Well developed, well nourished, in no acute distress. Head:  Normocephalic and atraumatic. Neck: no JVD Lungs: Clear to auscultation and percussion. Heart: Normal S1 and S2.  No murmur, rubs or gallops.  Pulses: Pulses normal in all 4 extremities. Extremities: No clubbing or cyanosis. No edema. Neurologic: Alert and oriented x 3.  EKG:  NSR.  WNL.   ASSESSMENT AND PLAN:

## 2012-06-11 NOTE — Assessment & Plan Note (Signed)
Appears to have modest control of sugars.  PM sugars are high.

## 2012-06-11 NOTE — Assessment & Plan Note (Signed)
Jeanne Kim has remained stable at the present time.  She is not having any progressive symptoms.  Will continue to observe.

## 2012-06-13 ENCOUNTER — Telehealth: Payer: Self-pay

## 2012-06-13 MED ORDER — RAMIPRIL 5 MG PO CAPS: 5.0000 mg | ORAL_CAPSULE | Freq: Every day | ORAL | Status: DC

## 2012-06-13 NOTE — Telephone Encounter (Signed)
Lauren--can you have her drop her ramipril to 5mg  per day and monitor her BP. Thanks. TS (from 06/11/12 OV)  I spoke with the pt and made her aware of this information. Rx sent to pharmacy per the pt's request and she will monitor BP at home.

## 2012-07-04 ENCOUNTER — Ambulatory Visit (INDEPENDENT_AMBULATORY_CARE_PROVIDER_SITE_OTHER): Payer: Medicare Other | Admitting: Gastroenterology

## 2012-07-04 DIAGNOSIS — E538 Deficiency of other specified B group vitamins: Secondary | ICD-10-CM

## 2012-07-04 MED ORDER — CYANOCOBALAMIN 1000 MCG/ML IJ SOLN
1000.0000 ug | Freq: Once | INTRAMUSCULAR | Status: AC
  Administered 2012-07-04: 1000 ug via INTRAMUSCULAR

## 2012-07-16 ENCOUNTER — Encounter: Payer: Self-pay | Admitting: Gastroenterology

## 2012-07-16 NOTE — Progress Notes (Signed)
b12 completed

## 2012-08-11 ENCOUNTER — Telehealth: Payer: Self-pay | Admitting: Cardiology

## 2012-08-11 NOTE — Telephone Encounter (Signed)
New problem:   C/O sob. Will discuss further when nurse calls .

## 2012-08-11 NOTE — Telephone Encounter (Signed)
Left message to call back  

## 2012-08-12 ENCOUNTER — Ambulatory Visit (INDEPENDENT_AMBULATORY_CARE_PROVIDER_SITE_OTHER): Payer: Medicare Other | Admitting: Gastroenterology

## 2012-08-12 DIAGNOSIS — E531 Pyridoxine deficiency: Secondary | ICD-10-CM

## 2012-08-12 MED ORDER — CYANOCOBALAMIN 1000 MCG/ML IJ SOLN
1000.0000 ug | Freq: Once | INTRAMUSCULAR | Status: AC
  Administered 2012-08-12: 1000 ug via INTRAMUSCULAR

## 2012-08-14 NOTE — Telephone Encounter (Signed)
Pt. states that she had pain on both sides of her neck on 11/3 that lasted a short while and no pain since.  She also is  concerned about her breathing and states that her friends are telling her that her breathing has gotten louder, she denies sob at this time. She wants to know if she should have f/u with Dr. Riley Kill sooner than planned OV in January (she is to have Carotid Duplex the day of f/u as well). Please advise.

## 2012-08-14 NOTE — Telephone Encounter (Signed)
LMTCB

## 2012-08-14 NOTE — Telephone Encounter (Signed)
Left message to call back  

## 2012-08-14 NOTE — Telephone Encounter (Signed)
Please schedule this pt to see Dr Riley Kill next week.

## 2012-08-14 NOTE — Telephone Encounter (Signed)
F/U   Returning call back to nurse.   

## 2012-08-14 NOTE — Telephone Encounter (Signed)
**Note De-Identified Barnie Sopko Obfuscation** Appt. scheduled for 11/14 at 4:00 with Dr. Riley Kill, pt. aware.

## 2012-08-19 ENCOUNTER — Ambulatory Visit (INDEPENDENT_AMBULATORY_CARE_PROVIDER_SITE_OTHER): Payer: Medicare Other | Admitting: Cardiology

## 2012-08-19 ENCOUNTER — Telehealth: Payer: Self-pay | Admitting: Physician Assistant

## 2012-08-19 ENCOUNTER — Encounter: Payer: Self-pay | Admitting: Cardiology

## 2012-08-19 VITALS — BP 118/70 | HR 77 | Ht 65.0 in | Wt 173.0 lb

## 2012-08-19 DIAGNOSIS — R0609 Other forms of dyspnea: Secondary | ICD-10-CM

## 2012-08-19 DIAGNOSIS — R0600 Dyspnea, unspecified: Secondary | ICD-10-CM

## 2012-08-19 DIAGNOSIS — R0989 Other specified symptoms and signs involving the circulatory and respiratory systems: Secondary | ICD-10-CM

## 2012-08-19 DIAGNOSIS — I2510 Atherosclerotic heart disease of native coronary artery without angina pectoris: Secondary | ICD-10-CM

## 2012-08-19 DIAGNOSIS — R55 Syncope and collapse: Secondary | ICD-10-CM

## 2012-08-19 NOTE — Patient Instructions (Addendum)
Your physician has requested that you have a lower extremity venous duplex THIS WEEK. This test is an ultrasound of the veins in the legs or arms. It looks at venous blood flow that carries blood from the heart to the legs or arms. Allow one hour for a Lower Venous exam. Allow thirty minutes for an Upper Venous exam. There are no restrictions or special instructions.   Your physician has requested that you have an exercise stress myoview on November 25th per Dr. Riley Kill. For further information please visit [email or web address] Please follow instruction sheet, as given.  Labs today:  D-Dimer

## 2012-08-19 NOTE — Telephone Encounter (Signed)
Received call from [facility] that d-dimer was normal at 0.40. Will forward to Dr. Riley Kill for review. Dayna Dunn PA-C

## 2012-08-20 ENCOUNTER — Encounter (INDEPENDENT_AMBULATORY_CARE_PROVIDER_SITE_OTHER): Payer: Medicare Other

## 2012-08-20 DIAGNOSIS — R609 Edema, unspecified: Secondary | ICD-10-CM

## 2012-08-20 DIAGNOSIS — R0600 Dyspnea, unspecified: Secondary | ICD-10-CM

## 2012-08-20 DIAGNOSIS — R0602 Shortness of breath: Secondary | ICD-10-CM

## 2012-08-27 DIAGNOSIS — R0600 Dyspnea, unspecified: Secondary | ICD-10-CM | POA: Insufficient documentation

## 2012-08-27 NOTE — Assessment & Plan Note (Signed)
The patient hasn't noticed some mild dyspnea. However she is probably concerned about it. She has been onto very long car rides recently, although does not have significant lower extremity edema. There is no evidence of DVT on examination. She is a female, so dyspnea could be a sign of underlying coronary disease. We will get some venous Dopplers, and a d-dimer to make sure that she does not have DVT associated with long car rides as this can be supple. In addition, we will consider doing a stress perfusion imaging study to exclude underlying coronary artery disease. She had modest plaque when last cath in 2006. She is a diabetic, and does have some dyspnea. She did mention the possibility of doing CPX testing, but we will defer this for the present time. I will see her back in followup after these are completed.

## 2012-08-27 NOTE — Assessment & Plan Note (Signed)
Jeanne Kim remains stable at the present time. Are not highly convinced that she has progression of her coronary disease, but certainly this would be quite conceivable given her underlying diagnoses. She is diabetic with modest elevation of her BMI. She did have 30-40% narrowing in her LAD in 2006. She does have modest dyspnea. We will thoroughly evaluate her.

## 2012-08-27 NOTE — Progress Notes (Signed)
HPI:  Jeanne Kim returns today in a followup visit.  Overall she is stable, and think she is doing reasonably well. However, her friends note that they think she is somewhat more short of breath. She has been on a long car ride to [place], and also been on a car ride to [place]. She was scheduled to go to [place], but may be headed in [facility] instead. She had has had no further spells of profound weakness.  She does have concern about her overall coronary status, given her underlying diabetes.  Current Outpatient Prescriptions  Medication Sig Dispense Refill  . ACIPHEX 20 MG tablet TAKE 1 TABLET EVERY DAY  30 tablet  3  . ALPRAZolam (XANAX) 0.5 MG tablet Take 0.5 mg by mouth at bedtime.      Marland Kitchen anastrozole (ARIMIDEX) 1 MG tablet Take 1 mg by mouth daily.      Marland Kitchen aspirin 325 MG tablet Take 325 mg by mouth daily.      Marland Kitchen atorvastatin (LIPITOR) 20 MG tablet Take 1 tablet (20 mg total) by mouth daily.  90 tablet  3  . calcium-vitamin D (OSCAL WITH D) 500-200 MG-UNIT per tablet Take 1 tablet by mouth 2 (two) times daily.      . Coenzyme Q10 (CO Q-10) 100 MG CAPS Take 100 mg by mouth daily.      . cyanocobalamin (,VITAMIN B-12,) 1000 MCG/ML injection Inject 1,000 mcg into the muscle every 30 (thirty) days.      . diclofenac (VOLTAREN) 75 MG EC tablet Take by mouth 2 (two) times daily.       . DULoxetine (CYMBALTA) 60 MG capsule Take 60 mg by mouth daily.      . fluticasone (FLONASE) 50 MCG/ACT nasal spray Place 2 sprays into the nose daily.      . insulin detemir (LEVEMIR) 100 UNIT/ML injection Inject 20 Units into the skin at bedtime. Sliding scale      . insulin glulisine (APIDRA) 100 UNIT/ML injection Inject 7 Units into the skin as needed.       Marland Kitchen levothyroxine (SYNTHROID, LEVOTHROID) 112 MCG tablet Take 112 mcg by mouth daily.      . metFORMIN (GLUCOPHAGE) 1000 MG tablet Take 1,000 mg by mouth 2 (two) times daily with a meal.      . metoprolol succinate (TOPROL-XL) 25 MG 24 hr tablet Take 1  tablet (25 mg total) by mouth at bedtime. Take with or immediately following a meal.      . montelukast (SINGULAIR) 10 MG tablet Take 10 mg by mouth at bedtime.      . ramipril (ALTACE) 5 MG capsule Take 1 capsule (5 mg total) by mouth daily.  90 capsule  3   Current Facility-Administered Medications  Medication Dose Route Frequency Provider Last Rate Last Dose  . cyanocobalamin ((VITAMIN B-12)) injection 1,000 mcg  1,000 mcg Intramuscular Q30 days Mardella Layman, MD   1,000 mcg at 06/03/12 1050    Allergies  Allergen Reactions  . Codeine Hives  . Sulfonamide Derivatives Hives    Past Medical History  Diagnosis Date  . Esophageal reflux   . Hiatal hernia   . Unspecified gastritis and gastroduodenitis without mention of hemorrhage   . Esophageal stricture   . Type II or unspecified type diabetes mellitus without mention of complication, not stated as uncontrolled   . Fibromyalgia   . Hypertension   . Anxiety   . Depression   . Sleep apnea  CPAP  . Kidney stones   . Hypercholesterolemia   . Thyroid disease   . IBS (irritable bowel syndrome)   . Breast cancer   . Cataract   . Cataract     Past Surgical History  Procedure Date  . Cardiac catheterization   . Total abdominal hysterectomy   . Appendectomy   . Cystocele repair   . Rectocele repair   . Thyroidectomy   . Breast lumpectomy     left  . Cervical fusion   . Cholecystectomy   . Tonsillectomy   . Cesarean section     Family History  Problem Relation Age of Onset  . Esophageal cancer Maternal Uncle   . Ovarian cancer Maternal Aunt   . Colon cancer Maternal Uncle   . Lung cancer Maternal Uncle   . Sarcoidosis Mother   . Lung cancer Father   . Diabetes Mother   . Diabetes Father   . Diabetes Maternal Uncle   . Diabetes Maternal Aunt   . Colon polyps Maternal Uncle   . Heart disease Father   . Heart disease Paternal Grandmother   . Urolithiasis Father     History   Social History  . Marital  Status: Married    Spouse Name: N/A    Number of Children: 4  . Years of Education: N/A   Occupational History  . Retired Engineer, civil (consulting)    Social History Main Topics  . Smoking status: Never Smoker   . Smokeless tobacco: Never Used  . Alcohol Use: No  . Drug Use: No  . Sexually Active: Not on file   Other Topics Concern  . Not on file   Social History Narrative  . No narrative on file    ROS: Please see the HPI.  All other systems reviewed and negative.  PHYSICAL EXAM:  BP 118/70  Pulse 77  Ht 5\' 5"  (1.651 m)  Wt 173 lb (78.472 kg)  BMI 28.79 kg/m2  SpO2 94%  General: Well developed, well nourished, in no acute distress. Head:  Normocephalic and atraumatic. Neck: no JVD Lungs: Clear to auscultation and percussion. Heart: Normal S1 and S2.  No murmur, rubs or gallops.  Pulses: Pulses normal in all 4 extremities. Extremities: No clubbing or cyanosis. No edema. Neurologic: Alert and oriented x 3.  EKG:  CATH 2006  RESULTS:  1. LEFT MAIN CORONARY ARTERY: The left main coronary artery is free of  critical disease. Unlike the previous study, there does not appear to  be 20% tapered narrowing.  2. LEFT ANTERIOR DESCENDING CORONARY ARTERY: The left anterior descending  coronary artery courses to the apex. As on previous studies, there is  about a 30% area of eccentric plaquing noted between the origins of the  two diagonal branches. The vessel was otherwise without critical  disease.  3. CIRCUMFLEX CORONARY ARTERY: The circumflex coronary artery consists of  a very large one marginal branch that courses out over the apical  lateral wall. No significant focal stenosis is noted.  4. RIGHT CORONARY ARTERY: The right coronary artery as in the previous  study is a large caliber vessel that provides twin PDA's, and two  posterolateral branches. Neither of these demonstrate significant  obstruction.  CONCLUSION:  1. Well-preserved left ventricular function.  2. No evidence  of aortic regurgitation or dissection.  3. A 30% to at most 40% mid-left anterior descending coronary artery  stenosis, as noted on the previous study.  4. No other high-grade critical obstructions.  PLAN:  Continued medical therapy would be recommended. The patient will  remain on her Lipitor. I will see her back in followup in the office and we  will discuss other potential options. We could use long-acting nitrates.  We will continue to see her on a regular basis.  TDS/MEDQ D: 10/17/2004 T: 10/17/2004 Job: [id number]    ASSESSMENT AND PLAN:

## 2012-08-27 NOTE — Assessment & Plan Note (Signed)
She's had no further episodes of syncope.

## 2012-09-01 ENCOUNTER — Ambulatory Visit (HOSPITAL_COMMUNITY): Payer: Medicare Other | Attending: Cardiology | Admitting: Radiology

## 2012-09-01 VITALS — Ht 65.0 in | Wt 171.0 lb

## 2012-09-01 DIAGNOSIS — R0600 Dyspnea, unspecified: Secondary | ICD-10-CM

## 2012-09-01 DIAGNOSIS — R0602 Shortness of breath: Secondary | ICD-10-CM

## 2012-09-01 DIAGNOSIS — R0609 Other forms of dyspnea: Secondary | ICD-10-CM | POA: Insufficient documentation

## 2012-09-01 DIAGNOSIS — R0989 Other specified symptoms and signs involving the circulatory and respiratory systems: Secondary | ICD-10-CM | POA: Insufficient documentation

## 2012-09-01 DIAGNOSIS — R079 Chest pain, unspecified: Secondary | ICD-10-CM

## 2012-09-01 MED ORDER — TECHNETIUM TC 99M SESTAMIBI GENERIC - CARDIOLITE
10.0000 | Freq: Once | INTRAVENOUS | Status: AC | PRN
  Administered 2012-09-01: 10 via INTRAVENOUS

## 2012-09-01 MED ORDER — TECHNETIUM TC 99M SESTAMIBI GENERIC - CARDIOLITE
30.0000 | Freq: Once | INTRAVENOUS | Status: AC | PRN
  Administered 2012-09-01: 30 via INTRAVENOUS

## 2012-09-01 MED ORDER — REGADENOSON 0.4 MG/5ML IV SOLN
0.4000 mg | Freq: Once | INTRAVENOUS | Status: AC
  Administered 2012-09-01: 0.4 mg via INTRAVENOUS

## 2012-09-01 NOTE — Progress Notes (Signed)
[facility] SITE 3 NUCLEAR MED [address] [id number] [place] [address] [phone]  Cardiology Nuclear Med Study  Jeanne Kim is a 71 y.o. female     MRN : [id number]     DOB: Dec 02, 1940  Procedure Date: 09/01/2012  Nuclear Med Background Indication for Stress Test:  Evaluation for Ischemia History: ? MI with elevated enzymes > 10 yrs ago per patient, '05 Myocardial Perfusion Study-no ischemia, EF= 74%,'06 Heart Catheterization-Non Obstructive CAD, No EF,01-2012 GXT: normal, NS ST-T changes,no change with exercise,and 02-2012 Echo: EF=55-60% Cardiac Risk Factors: Carotid Disease, Family History - CAD, Hypertension, IDDM Type 2 and Lipids  Symptoms:  Chest Tightness with/without exertion (last occurrence 2 weeks ago), Dizziness, DOE, Fatigue, Fatigue with Exertion, Light-Headedness, Nausea, Near Syncope, Palpitations, SOB, Syncope and Vomiting   Nuclear Pre-Procedure Caffeine/Decaff Intake:  8:00pm NPO After: 10:00pm   Lungs:  clear O2 Sat: 95-98% % on room air. IV 0.9% NS with Angio Cath:  22g  IV Site: R Wrist  IV Started by:  Cathlyn Parsons, RN  Chest Size (in):  38 Cup Size: C  Height: 5\' 5"  (1.651 m) Weight:  171 lb (77.565 kg)  BMI:  Body mass index is 28.46 kg/(m^2). Tech Comments:  Toprol taken at 8pm on 08/31/12    Nuclear Med Study 1 or 2 day study: 1 day  Stress Test Type:  Treadmill/Lexiscan  Reading MD: Charlton Haws, MD  Order Authorizing Provider:  Blenda Nicely  Resting Radionuclide: Technetium 60m Sestamibi  Resting Radionuclide Dose: 9.4 mCi   Stress Radionuclide:  Technetium 21m Sestamibi  Stress Radionuclide Dose: 31.3 mCi           Stress Protocol Rest HR: 62 Stress HR: 111  Rest BP: 113/56 Stress BP: 162/74  Exercise Time (min): 7:01 METS: 6.0   Predicted Max HR: 151 bpm % Max HR: 73.51 bpm Rate Pressure Product: 03474   Dose of Adenosine (mg):  n/a Dose of Lexiscan: 0.4 mg  Dose of Atropine (mg): n/a Dose of  Dobutamine: n/a mcg/kg/min (at max HR)  Stress Test Technologist: Irean Hong, RN  Nuclear Technologist:  Domenic Polite, CNMT     Rest Procedure:  Myocardial perfusion imaging was performed at rest 45 minutes following the intravenous administration of Technetium 74m Sestamibi. Rest ECG: NSR - Normal EKG  Stress Procedure:  The patient attempted to walk the treadmill utilizing the Bruce Protocol for 5 minutes , but was unable to reach target heart rate due to DOE, and  bilateral hip pain 6/10. The patient complained of Chest Tightness 3/10 at peak exercise.The patient received IV Lexiscan 0.4 mg over 15-seconds with concurrent low level exercise and then Technetium 15m Sestamibi was injected at 30-seconds while the patient continued walking one more minute. There were significant changes with Lexiscan. The patien Quantitative spect images were obtained after a 45-minute delay. Stress ECG: No significant ST segment change suggestive of ischemia.  QPS Raw Data Images:  Patient motion noted; appropriate software correction applied. Stress Images:  There is mild decreased activity in a small area at the apical cap. The degree of photon reduction is mild. Rest Images:  Uptake is normal in all segments. Subtraction (SDS):  No evidence of ischemia. Transient Ischemic Dilatation (Normal <1.22):  0.98 Lung/Heart Ratio (Normal <0.45):  0.28  Quantitative Gated Spect Images QGS EDV:  68 ml QGS ESV:  14 ml  Impression Exercise Capacity:  Lexiscan with low level exercise. BP Response:  Normal blood pressure response. Clinical  Symptoms:  dyspnea ,Also there was mild chest tightness at peak exercise ECG Impression:  No significant ST segment change suggestive of ischemia. Comparison with Prior Nuclear Study: No previous nuclear study performed  Overall Impression:  Normal stress nuclear study. With stress there was very slight decreased activity  at the apical cap. I believe that the difference  between the stress and rest images is related to shifting breast attenuation. There is no definite ischemia. This is a LOW RISK scan.  LV Ejection Fraction: 80%.  LV Wall Motion:  Normal Wall Motion.  Willa Rough, MD

## 2012-10-17 ENCOUNTER — Encounter (INDEPENDENT_AMBULATORY_CARE_PROVIDER_SITE_OTHER): Payer: Medicare Other

## 2012-10-17 ENCOUNTER — Ambulatory Visit (INDEPENDENT_AMBULATORY_CARE_PROVIDER_SITE_OTHER): Payer: Medicare Other | Admitting: Gastroenterology

## 2012-10-17 ENCOUNTER — Ambulatory Visit (INDEPENDENT_AMBULATORY_CARE_PROVIDER_SITE_OTHER): Payer: Medicare Other | Admitting: Cardiology

## 2012-10-17 VITALS — BP 126/70 | HR 73 | Ht 65.0 in | Wt 175.0 lb

## 2012-10-17 DIAGNOSIS — I6529 Occlusion and stenosis of unspecified carotid artery: Secondary | ICD-10-CM

## 2012-10-17 DIAGNOSIS — R0989 Other specified symptoms and signs involving the circulatory and respiratory systems: Secondary | ICD-10-CM

## 2012-10-17 DIAGNOSIS — E538 Deficiency of other specified B group vitamins: Secondary | ICD-10-CM

## 2012-10-17 DIAGNOSIS — G4733 Obstructive sleep apnea (adult) (pediatric): Secondary | ICD-10-CM

## 2012-10-17 DIAGNOSIS — E119 Type 2 diabetes mellitus without complications: Secondary | ICD-10-CM

## 2012-10-17 DIAGNOSIS — I951 Orthostatic hypotension: Secondary | ICD-10-CM

## 2012-10-17 DIAGNOSIS — R55 Syncope and collapse: Secondary | ICD-10-CM

## 2012-10-17 DIAGNOSIS — R0600 Dyspnea, unspecified: Secondary | ICD-10-CM

## 2012-10-17 DIAGNOSIS — I2510 Atherosclerotic heart disease of native coronary artery without angina pectoris: Secondary | ICD-10-CM

## 2012-10-17 MED ORDER — ATORVASTATIN CALCIUM 20 MG PO TABS: 20.0000 mg | ORAL_TABLET | Freq: Every day | ORAL | Status: DC

## 2012-10-17 MED ORDER — METOPROLOL SUCCINATE ER 25 MG PO TB24: 25.0000 mg | ORAL_TABLET | Freq: Every day | ORAL | Status: DC

## 2012-10-17 NOTE — Assessment & Plan Note (Signed)
Wide swings in glucose per patient.

## 2012-10-17 NOTE — Assessment & Plan Note (Signed)
Using CPAP 

## 2012-10-17 NOTE — Progress Notes (Addendum)
HPI:  The patient returns in followup. We discussed her radionuclide imaging today, and also the findings. She still has modest fatigue with exercise. She is also slightly short of breath. None of this is dramatically new. She's continued to remain stable overall. We discussed the various implications. She also notices a little bit of lightheadedness,. She did tell me that her diabetes is been more brittle recently.  Current Outpatient Prescriptions  Medication Sig Dispense Refill  . ACIPHEX 20 MG tablet TAKE 1 TABLET EVERY DAY  30 tablet  3  . ALPRAZolam (XANAX) 0.5 MG tablet Take 0.5 mg by mouth at bedtime.      Marland Kitchen anastrozole (ARIMIDEX) 1 MG tablet Take 1 mg by mouth daily.      Marland Kitchen aspirin 325 MG tablet Take 325 mg by mouth daily.      Marland Kitchen atorvastatin (LIPITOR) 20 MG tablet Take 1 tablet (20 mg total) by mouth daily.  90 tablet  3  . calcium-vitamin D (OSCAL WITH D) 500-200 MG-UNIT per tablet Take 1 tablet by mouth 2 (two) times daily.      . Coenzyme Q10 (CO Q-10) 100 MG CAPS Take 100 mg by mouth daily.      . cyanocobalamin (,VITAMIN B-12,) 1000 MCG/ML injection Inject 1,000 mcg into the muscle every 30 (thirty) days.      . diclofenac (VOLTAREN) 75 MG EC tablet Take by mouth 2 (two) times daily.       . DULoxetine (CYMBALTA) 60 MG capsule Take 60 mg by mouth daily.      . fluticasone (FLONASE) 50 MCG/ACT nasal spray Place 2 sprays into the nose daily.      . insulin detemir (LEVEMIR) 100 UNIT/ML injection Inject 20 Units into the skin at bedtime. Sliding scale      . insulin glulisine (APIDRA) 100 UNIT/ML injection Inject 7 Units into the skin as needed.       Marland Kitchen levothyroxine (SYNTHROID, LEVOTHROID) 112 MCG tablet Take 112 mcg by mouth daily.      . metFORMIN (GLUCOPHAGE) 1000 MG tablet Take 1,000 mg by mouth 2 (two) times daily with a meal.      . metoprolol succinate (TOPROL-XL) 25 MG 24 hr tablet Take 1 tablet (25 mg total) by mouth at bedtime. Take with or immediately following a meal.   90 tablet  3  . montelukast (SINGULAIR) 10 MG tablet Take 10 mg by mouth at bedtime.      . ramipril (ALTACE) 5 MG capsule Take 1 capsule (5 mg total) by mouth daily.  90 capsule  3   Current Facility-Administered Medications  Medication Dose Route Frequency Provider Last Rate Last Dose  . cyanocobalamin ((VITAMIN B-12)) injection 1,000 mcg  1,000 mcg Intramuscular Q30 days Mardella Layman, MD   1,000 mcg at 10/17/12 1434    Allergies  Allergen Reactions  . Codeine Hives  . Sulfonamide Derivatives Hives    Past Medical History  Diagnosis Date  . Esophageal reflux   . Hiatal hernia   . Unspecified gastritis and gastroduodenitis without mention of hemorrhage   . Esophageal stricture   . Type II or unspecified type diabetes mellitus without mention of complication, not stated as uncontrolled   . Fibromyalgia   . Hypertension   . Anxiety   . Depression   . Sleep apnea     CPAP  . Kidney stones   . Hypercholesterolemia   . Thyroid disease   . IBS (irritable bowel syndrome)   .  Breast cancer   . Cataract   . Cataract     Past Surgical History  Procedure Date  . Cardiac catheterization   . Total abdominal hysterectomy   . Appendectomy   . Cystocele repair   . Rectocele repair   . Thyroidectomy   . Breast lumpectomy     left  . Cervical fusion   . Cholecystectomy   . Tonsillectomy   . Cesarean section     Family History  Problem Relation Age of Onset  . Esophageal cancer Maternal Uncle   . Ovarian cancer Maternal Aunt   . Colon cancer Maternal Uncle   . Lung cancer Maternal Uncle   . Sarcoidosis Mother   . Lung cancer Father   . Diabetes Mother   . Diabetes Father   . Diabetes Maternal Uncle   . Diabetes Maternal Aunt   . Colon polyps Maternal Uncle   . Heart disease Father   . Heart disease Paternal Grandmother   . Urolithiasis Father     History   Social History  . Marital Status: Married    Spouse Name: N/A    Number of Children: 4  . Years of  Education: N/A   Occupational History  . Retired Engineer, civil (consulting)    Social History Main Topics  . Smoking status: Never Smoker   . Smokeless tobacco: Never Used  . Alcohol Use: No  . Drug Use: No  . Sexually Active: Not on file   Other Topics Concern  . Not on file   Social History Narrative  . No narrative on file    ROS: Please see the HPI.  All other systems reviewed and negative.  PHYSICAL EXAM:  BP 126/70  Pulse 73  Ht 5\' 5"  (1.651 m)  Wt 175 lb (79.379 kg)  BMI 29.12 kg/m2  SpO2 98%  BP 140 supine  128 standing 128 sitting  128 sitting at 10 minutes  General: Well developed, well nourished, in no acute distress. Head:  Normocephalic and atraumatic. Neck: no JVD Lungs: Clear to auscultation and percussion. Heart: Normal S1 and S2.  No murmur, rubs or gallops.  Pulses: Pulses normal in all 4 extremities. Extremities: No clubbing or cyanosis. No edema. Neurologic: Alert and oriented x 3.  EKG:  NSR.   Q in 3, non diagnostic Q in AVF.  Cannot exclude inferior MI, old  (Nuclear normal perfusion).  Not significantly changed from prior tracing.    ASSESSMENT AND PLAN:

## 2012-10-17 NOTE — Assessment & Plan Note (Signed)
She does not have dramatic symptoms, but she does note some continued fatigue.  Of note, her DM is fairly brittle, with wide swings, and she is not having chest pain per se.  Cannot exclude CAD, and carotid does suggest some plaque.  However, based on nuclear, will favor continued conservative management with close office follow up.  I will see again in March.

## 2012-10-17 NOTE — Assessment & Plan Note (Signed)
May be secondary to DM.  Mild.  Encouraged to strongly watch sugar and maintain good hydration.

## 2012-10-17 NOTE — Patient Instructions (Addendum)
Follow-up with Dr. Riley Kill on 01/02/13  Continue taking medications as directed

## 2012-10-21 ENCOUNTER — Other Ambulatory Visit: Payer: Self-pay

## 2012-10-21 DIAGNOSIS — I6529 Occlusion and stenosis of unspecified carotid artery: Secondary | ICD-10-CM

## 2012-11-17 ENCOUNTER — Telehealth: Payer: Self-pay | Admitting: Cardiology

## 2012-11-17 NOTE — Telephone Encounter (Signed)
New problem    Status of referral . Will discuss when Lauren called.

## 2012-11-18 ENCOUNTER — Ambulatory Visit (INDEPENDENT_AMBULATORY_CARE_PROVIDER_SITE_OTHER): Payer: Medicare Other | Admitting: Gastroenterology

## 2012-11-18 DIAGNOSIS — E538 Deficiency of other specified B group vitamins: Secondary | ICD-10-CM

## 2012-11-18 MED ORDER — CYANOCOBALAMIN 1000 MCG/ML IJ SOLN
1000.0000 ug | Freq: Once | INTRAMUSCULAR | Status: AC
  Administered 2012-11-18: 1000 ug via INTRAMUSCULAR

## 2012-11-18 NOTE — Telephone Encounter (Signed)
I spoke with the pt and she was calling to see if an appointment had been scheduled with Dr [name].  I made the pt aware that an order for referral to [facility] had been placed and that the [facility] should have contacted her with appt.  The pt has not been scheduled to see Dr [name] at this time. I will have [facility] arrange appointment and contact the pt.

## 2012-11-18 NOTE — Patient Instructions (Signed)
Patient has appointment for next month.

## 2012-12-01 ENCOUNTER — Other Ambulatory Visit: Payer: Self-pay | Admitting: Gynecology

## 2012-12-10 ENCOUNTER — Telehealth: Payer: Self-pay | Admitting: Gastroenterology

## 2012-12-10 MED ORDER — CYANOCOBALAMIN 1000 MCG/ML IJ SOLN: 1000.0000 ug | INTRAMUSCULAR | Status: DC

## 2012-12-10 NOTE — Telephone Encounter (Signed)
lmom for pt to call back. If she cannot reach me, leave a msg stating whether she wants me to order from [facility] or [facility]. Pt requested I call in order to [facility] [place]. Done.

## 2012-12-22 ENCOUNTER — Encounter: Payer: Self-pay | Admitting: Vascular Surgery

## 2012-12-23 ENCOUNTER — Encounter: Payer: Medicare Other | Admitting: Vascular Surgery

## 2012-12-25 ENCOUNTER — Ambulatory Visit (INDEPENDENT_AMBULATORY_CARE_PROVIDER_SITE_OTHER): Payer: Medicare Other | Admitting: Vascular Surgery

## 2012-12-25 ENCOUNTER — Encounter: Payer: Self-pay | Admitting: Vascular Surgery

## 2012-12-25 ENCOUNTER — Telehealth: Payer: Self-pay | Admitting: Cardiology

## 2012-12-25 DIAGNOSIS — I6529 Occlusion and stenosis of unspecified carotid artery: Secondary | ICD-10-CM

## 2012-12-25 NOTE — Progress Notes (Signed)
[facility] and [facility]in Specialist of [facility]   Patient name: Jeanne Kim MRN: [id number] DOB: 1941/09/18 Sex: female   Referred by: Riley Kill  Reason for referral:  Chief Complaint  Patient presents with  . New Evaluation    referred by Dr. Riley Kill  . Carotid    HISTORY OF PRESENT ILLNESS: The patient is seen today for evaluation of extracranial cerebrovascular occlusive disease. She had an evaluation beginning proximally 9 months ago. She was having some syncopal episodes. This was felt to be probably related to orthostatic hypotension. She had a carotid duplex at that time showing moderate disease. She had a recent repeat study to follow this up in January and did show some progression of her left carotid. She is seen today for further discussion of this. She specifically denies any hemispheric events. She specifically denies amaurosis fugax transient ischemic attack or stroke. He does have a history of coronary artery disease which is being treated medically. She does not smoke cigarettes. He does have per elevated lipids.  Past Medical History  Diagnosis Date  . Esophageal reflux   . Hiatal hernia   . Unspecified gastritis and gastroduodenitis without mention of hemorrhage   . Esophageal stricture   . Type II or unspecified type diabetes mellitus without mention of complication, not stated as uncontrolled   . Fibromyalgia   . Hypertension   . Anxiety   . Depression   . Sleep apnea     CPAP  . Kidney stones   . Hypercholesterolemia   . Thyroid disease   . IBS (irritable bowel syndrome)   . Breast cancer   . Cataract   . Cataract   . Anemia   . Coronary heart disease     Past Surgical History  Procedure Laterality Date  . Cardiac catheterization    . Total abdominal hysterectomy    . Appendectomy    . Cystocele repair    . Rectocele repair    . Thyroidectomy    . Breast lumpectomy      left  . Cervical fusion    . Cholecystectomy    . Tonsillectomy    . Cesarean  section      History   Social History  . Marital Status: Married    Spouse Name: N/A    Number of Children: 4  . Years of Education: N/A   Occupational History  . Retired Engineer, civil (consulting)    Social History Main Topics  . Smoking status: Never Smoker   . Smokeless tobacco: Never Used  . Alcohol Use: No  . Drug Use: No  . Sexually Active: Not on file   Other Topics Concern  . Not on file   Social History Narrative  . No narrative on file    Family History  Problem Relation Age of Onset  . Esophageal cancer Maternal Uncle   . Ovarian cancer Maternal Aunt   . Colon cancer Maternal Uncle   . Lung cancer Maternal Uncle   . Sarcoidosis Mother   . Lung cancer Father   . Diabetes Mother   . Diabetes Father   . Diabetes Maternal Uncle   . Diabetes Maternal Aunt   . Colon polyps Maternal Uncle   . Heart disease Father   . Heart disease Paternal Grandmother   . Urolithiasis Father     Allergies as of 12/25/2012 - Review Complete 12/25/2012  Allergen Reaction Noted  . Codeine Hives 04/27/2009  . Sulfonamide derivatives Hives 04/27/2009    Current Outpatient Prescriptions  on File Prior to Visit  Medication Sig Dispense Refill  . ACIPHEX 20 MG tablet TAKE 1 TABLET EVERY DAY  30 tablet  3  . ALPRAZolam (XANAX) 0.5 MG tablet Take 0.5 mg by mouth at bedtime.      Marland Kitchen anastrozole (ARIMIDEX) 1 MG tablet Take 1 mg by mouth daily.      Marland Kitchen aspirin 325 MG tablet Take 325 mg by mouth daily.      Marland Kitchen atorvastatin (LIPITOR) 20 MG tablet Take 1 tablet (20 mg total) by mouth daily.  90 tablet  3  . calcium-vitamin D (OSCAL WITH D) 500-200 MG-UNIT per tablet Take 1 tablet by mouth 2 (two) times daily.      . Coenzyme Q10 (CO Q-10) 100 MG CAPS Take 100 mg by mouth daily.      . cyanocobalamin (,VITAMIN B-12,) 1000 MCG/ML injection Inject 1 mL (1,000 mcg total) into the muscle every 30 (thirty) days.  10 mL  2  . diclofenac (VOLTAREN) 75 MG EC tablet Take by mouth 2 (two) times daily.       .  DULoxetine (CYMBALTA) 60 MG capsule Take 60 mg by mouth daily.      . fluticasone (FLONASE) 50 MCG/ACT nasal spray Place 2 sprays into the nose daily.      . insulin detemir (LEVEMIR) 100 UNIT/ML injection Inject 25 Units into the skin at bedtime. Sliding scale      . insulin glulisine (APIDRA) 100 UNIT/ML injection Inject 8 Units into the skin as needed.       Marland Kitchen levothyroxine (SYNTHROID, LEVOTHROID) 112 MCG tablet Take 112 mcg by mouth daily.      . metFORMIN (GLUCOPHAGE) 1000 MG tablet Take 1,000 mg by mouth 2 (two) times daily with a meal.      . metoprolol succinate (TOPROL-XL) 25 MG 24 hr tablet Take 1 tablet (25 mg total) by mouth at bedtime. Take with or immediately following a meal.  90 tablet  3  . montelukast (SINGULAIR) 10 MG tablet Take 10 mg by mouth at bedtime.      . ramipril (ALTACE) 5 MG capsule Take 1 capsule (5 mg total) by mouth daily.  90 capsule  3   No current facility-administered medications on file prior to visit.     REVIEW OF SYSTEMS:  Positives indicated with an "X"  CARDIOVASCULAR:  [ ]  chest pain   [ ]  chest pressure   [ ]  palpitations   [ ]  orthopnea   [ ]  dyspnea on exertion   [ ]  claudication   [ ]  rest pain   [ ]  DVT   [ ]  phlebitis PULMONARY:   [ ]  productive cough   [ ]  asthma   [ ]  wheezing NEUROLOGIC:   [x ] weakness  [ ]  paresthesias  [ ]  aphasia  [ ]  amaurosis  [x ] dizziness HEMATOLOGIC:   [ ]  bleeding problems   [ ]  clotting disorders MUSCULOSKELETAL:  [x ] joint pain   [ ]  joint swelling GASTROINTESTINAL: [ ]   blood in stool  [ ]   hematemesis GENITOURINARY:  [ ]   dysuria  [ ]   hematuria PSYCHIATRIC:  [ ]  history of major depression INTEGUMENTARY:  [ ]  rashes  [ ]  ulcers CONSTITUTIONAL:  [ ]  fever   [ ]  chills  PHYSICAL EXAMINATION:  General: The patient is a well-nourished female, in no acute distress. Vital signs are BP 125/71  Pulse 72  Resp 16  Ht 5\' 4"  (1.626 m)  Wt 190 lb (86.183 kg)  BMI 32.6 kg/m2 Pulmonary: There is a good air  exchange bilaterally without wheezing or rales. Abdomen: Soft and non-tender with normal pitch bowel sounds. I do not palpate an aneurysm. Musculoskeletal: There are no major deformities.  There is no significant extremity pain. Neurologic: No focal weakness or paresthesias are detected, Skin: There are no ulcer or rashes noted. Psychiatric: The patient has normal affect. Cardiovascular: There is a regular rate and rhythm without significant murmur appreciated. Pulse status: 2+ radial 2+ femoral and 2+ dorsalis pedis pulses bilaterally Carotid arteries without bruits bilaterally    Vascular Lab Studies:  [facility] [place] [facility] [place] Flow velocities revealed no significant narrowing in the right carotid system. She does have predicted 60-79% stenosis in the left internal carotid artery. She does have some tortuosity in the imaging appeared to be less than 60-79%.  Impression and Plan:  Asymptomatic moderate to severe left internal carotid artery stenosis. I had a long discussion with the patient regarding symptoms of hemispheric changes that she should notify us immediately for. I also explained that with the tortuosity this may falsely elevate her prediction of level of disease. I would recommend 6 month interval followup. She requested that we perform this year in our office and continue to watch her carotid disease. I did discuss the treatment of progressive carotid disease to include endarterectomy. She had questions regarding carotid stenting in [place] this is certainly a procedure that is reserved for contraindications to carotid surgery. She understands and will see [place] again in July 2014 or 6 month followup from her January 2014 carotid study    Augustine Brannick [facility] [place] Office: [phone]

## 2012-12-25 NOTE — Telephone Encounter (Signed)
Plz return call to patient at hM# [phone] regarding referral

## 2012-12-25 NOTE — Telephone Encounter (Signed)
Left message to call back  

## 2012-12-26 NOTE — Addendum Note (Signed)
Addended by: Adria Dill L on: 12/26/2012 09:34 AM   Modules accepted: Orders

## 2012-12-26 NOTE — Telephone Encounter (Signed)
Left message to call back  

## 2013-01-02 ENCOUNTER — Ambulatory Visit: Payer: Medicare Other | Admitting: Cardiology

## 2013-01-02 NOTE — Telephone Encounter (Signed)
I will close this encounter since the pt has not called back.  

## 2013-01-20 ENCOUNTER — Encounter: Payer: Self-pay | Admitting: Cardiology

## 2013-01-20 ENCOUNTER — Ambulatory Visit (INDEPENDENT_AMBULATORY_CARE_PROVIDER_SITE_OTHER): Payer: Medicare Other | Admitting: Cardiology

## 2013-01-20 VITALS — BP 106/58 | HR 69 | Ht 65.0 in | Wt 191.0 lb

## 2013-01-20 DIAGNOSIS — R072 Precordial pain: Secondary | ICD-10-CM

## 2013-01-20 DIAGNOSIS — E7800 Pure hypercholesterolemia, unspecified: Secondary | ICD-10-CM

## 2013-01-20 DIAGNOSIS — I6523 Occlusion and stenosis of bilateral carotid arteries: Secondary | ICD-10-CM

## 2013-01-20 DIAGNOSIS — I10 Essential (primary) hypertension: Secondary | ICD-10-CM

## 2013-01-20 DIAGNOSIS — E119 Type 2 diabetes mellitus without complications: Secondary | ICD-10-CM

## 2013-01-20 DIAGNOSIS — I2510 Atherosclerotic heart disease of native coronary artery without angina pectoris: Secondary | ICD-10-CM

## 2013-01-20 DIAGNOSIS — I658 Occlusion and stenosis of other precerebral arteries: Secondary | ICD-10-CM

## 2013-01-20 DIAGNOSIS — I6529 Occlusion and stenosis of unspecified carotid artery: Secondary | ICD-10-CM | POA: Insufficient documentation

## 2013-01-20 MED ORDER — METOPROLOL SUCCINATE ER 50 MG PO TB24: ORAL_TABLET | ORAL | Status: DC

## 2013-01-20 NOTE — Assessment & Plan Note (Signed)
Given the patient's underlying risk factors, this is always a potential goiter concern. She had myocardial perfusion imaging done late last year, and there is normal LV wall motion and no definite perfusion defect. Ejection fraction was 80%. She does have Q waves inferiorly, but these are not likely do a prior infarction

## 2013-01-20 NOTE — Progress Notes (Signed)
HPI:  Patient returns today in a followup visit. She's had a couple of episodes of chest discomfort, to occur in the last few weeks but overall this is been no change in her overall pattern. She denies any long ongoing chest discomfort. There has not been a another episode. She remains on file aspirin since her episode in [place].  Current Outpatient Prescriptions  Medication Sig Dispense Refill  . ACIPHEX 20 MG tablet TAKE 1 TABLET EVERY DAY  30 tablet  3  . ALPRAZolam (XANAX) 0.5 MG tablet Take 0.5 mg by mouth at bedtime.      Marland Kitchen anastrozole (ARIMIDEX) 1 MG tablet Take 1 mg by mouth daily.      Marland Kitchen aspirin 325 MG tablet Take 325 mg by mouth daily.      Marland Kitchen atorvastatin (LIPITOR) 20 MG tablet Take 1 tablet (20 mg total) by mouth daily.  90 tablet  3  . calcium-vitamin D (OSCAL WITH D) 500-200 MG-UNIT per tablet Take 1 tablet by mouth 2 (two) times daily.      . Coenzyme Q10 (CO Q-10) 100 MG CAPS Take 100 mg by mouth daily.      . cyanocobalamin (,VITAMIN B-12,) 1000 MCG/ML injection Inject 1 mL (1,000 mcg total) into the muscle every 30 (thirty) days.  10 mL  2  . diclofenac (VOLTAREN) 75 MG EC tablet Take by mouth 2 (two) times daily.       . DULoxetine (CYMBALTA) 60 MG capsule Take 60 mg by mouth daily.      . fluticasone (FLONASE) 50 MCG/ACT nasal spray Place 2 sprays into the nose daily.      . insulin detemir (LEVEMIR) 100 UNIT/ML injection Inject 25 Units into the skin at bedtime. Sliding scale      . insulin glulisine (APIDRA) 100 UNIT/ML injection Inject 8 Units into the skin as needed.       Marland Kitchen levothyroxine (SYNTHROID, LEVOTHROID) 112 MCG tablet Take 112 mcg by mouth daily.      . metFORMIN (GLUCOPHAGE) 1000 MG tablet Take 1,000 mg by mouth 2 (two) times daily with a meal.      . metoprolol succinate (TOPROL-XL) 50 MG 24 hr tablet Take as directed daily with or immediately following a meal.  90 tablet  3  . montelukast (SINGULAIR) 10 MG tablet Take 10 mg by mouth at bedtime.      .  ramipril (ALTACE) 5 MG capsule Take 1 capsule (5 mg total) by mouth daily.  90 capsule  3   No current facility-administered medications for this visit.    Allergies  Allergen Reactions  . Codeine Hives  . Sulfonamide Derivatives Hives    Past Medical History  Diagnosis Date  . Esophageal reflux   . Hiatal hernia   . Unspecified gastritis and gastroduodenitis without mention of hemorrhage   . Esophageal stricture   . Type II or unspecified type diabetes mellitus without mention of complication, not stated as uncontrolled   . Fibromyalgia   . Hypertension   . Anxiety   . Depression   . Sleep apnea     CPAP  . Kidney stones   . Hypercholesterolemia   . Thyroid disease   . IBS (irritable bowel syndrome)   . Breast cancer   . Cataract   . Cataract   . Anemia   . Coronary heart disease     Past Surgical History  Procedure Laterality Date  . Cardiac catheterization    . Total abdominal  hysterectomy    . Appendectomy    . Cystocele repair    . Rectocele repair    . Thyroidectomy    . Breast lumpectomy      left  . Cervical fusion    . Cholecystectomy    . Tonsillectomy    . Cesarean section      Family History  Problem Relation Age of Onset  . Esophageal cancer Maternal Uncle   . Ovarian cancer Maternal Aunt   . Colon cancer Maternal Uncle   . Lung cancer Maternal Uncle   . Sarcoidosis Mother   . Lung cancer Father   . Diabetes Mother   . Diabetes Father   . Diabetes Maternal Uncle   . Diabetes Maternal Aunt   . Colon polyps Maternal Uncle   . Heart disease Father   . Heart disease Paternal Grandmother   . Urolithiasis Father     History   Social History  . Marital Status: Married    Spouse Name: N/A    Number of Children: 4  . Years of Education: N/A   Occupational History  . Retired Engineer, civil (consulting)    Social History Main Topics  . Smoking status: Never Smoker   . Smokeless tobacco: Never Used  . Alcohol Use: No  . Drug Use: No  . Sexually Active:  Not on file   Other Topics Concern  . Not on file   Social History Narrative  . No narrative on file    ROS: Please see the HPI.  All other systems reviewed and negative.  PHYSICAL EXAM:  BP 106/58  Pulse 69  Ht 5\' 5"  (1.651 m)  Wt 191 lb (86.637 kg)  BMI 31.78 kg/m2  SpO2 93%  General: Well developed, well nourished, in no acute distress. Head:  Normocephalic and atraumatic. Neck: no JVD Lungs: Clear to auscultation and percussion. Heart: Normal S1 and S2.  No murmur, rubs or gallops.  Abdomen:  Normal bowel sounds; soft; non tender; no organomegaly Extremities: No clubbing or cyanosis. No edema. Neurologic: Alert and oriented x 3.  EKG: NSR.  Moderate LVH.  Inferior infarct.  No change from prior tracings.     ASSESSMENT AND PLAN:

## 2013-01-20 NOTE — Patient Instructions (Addendum)
Your physician recommends that you schedule a follow-up appointment in: 2-3 MONTHS with Dr Jens Som (previous pt of Dr Riley Kill)  Your physician recommends that you have lab work today: TSH, BMP, LIPID, LIVER and HgbA1c  Your physician recommends that you continue on your current medications as directed. Please refer to the Current Medication list given to you today.

## 2013-01-20 NOTE — Assessment & Plan Note (Signed)
We will get a lipid liver profile. She is on statin therapy and we will reassess after this was obtained.

## 2013-01-20 NOTE — Assessment & Plan Note (Signed)
The patient has a carotid stenosis. She's being followed by Dr. early at the present time. She will remain on aspirin, and also on lipid-lowering therapy.

## 2013-01-21 LAB — BASIC METABOLIC PANEL
BUN: 20 mg/dL (ref 6–23)
CO2: 28 mEq/L (ref 19–32)
Calcium: 8.8 mg/dL (ref 8.4–10.5)
Chloride: 97 mEq/L (ref 96–112)
Creatinine, Ser: 1 mg/dL (ref 0.4–1.2)

## 2013-01-21 LAB — HEPATIC FUNCTION PANEL
AST: 23 U/L (ref 0–37)
Alkaline Phosphatase: 57 U/L (ref 39–117)
Total Bilirubin: 0.8 mg/dL (ref 0.3–1.2)

## 2013-01-21 LAB — LIPID PANEL
Cholesterol: 123 mg/dL (ref 0–200)
Total CHOL/HDL Ratio: 3

## 2013-03-17 ENCOUNTER — Encounter (HOSPITAL_COMMUNITY): Payer: Self-pay | Admitting: Pharmacy Technician

## 2013-03-23 NOTE — Patient Instructions (Addendum)
Jeanne Kim  03/23/2013   Your procedure is scheduled on:  Wednesday, June 23,2014  Report to [facility] at 11:30 AM.  Call this number if you have problems the morning of surgery: [phone]   Remember:   Do not eat food or drink liquids after midnight.   Take these medicines the morning of surgery with A SIP OF WATER: Cymbalta,Synthroid,Toptol Aciphex,Xanax, Altace, Singulair   Do not wear jewelry, make-up or nail polish.  Do not wear lotions, powders, or perfumes. You may wear deodorant.  Do not shave 48 hours prior to surgery. Men may shave face and neck.  Do not bring valuables to the hospital.  [facility] is not responsible for any belongings or valuables.  Contacts, dentures or bridgework may not be worn into surgery.  Leave suitcase in the car. After surgery it may be brought to your room.  For patients admitted to the hospital, checkout time is 11:00 AM the day of  discharge.   Patients discharged the day of surgery will not be allowed to drive  home.  Name and phone number of your driver: Family  Special Instructions:    Please read over the following fact sheets that you were given: Pain Booklet, Coughing and Deep Breathing, Surgical Site Infection Prevention, Anesthesia Post-op Instructions and Care and Recovery After Surgery    Cataract Surgery  A cataract is a clouding of the lens of the eye. When a lens becomes cloudy, vision is reduced based on the degree and nature of the clouding. Surgery may be needed to improve vision. Surgery removes the cloudy lens and usually replaces it with a substitute lens (intraocular lens, IOL). LET YOUR EYE DOCTOR KNOW ABOUT:  Allergies to food or medicine.  Medicines taken including herbs, eyedrops, over-the-counter medicines, and creams.  Use of steroids (by mouth or creams).  Previous problems with anesthetics or numbing medicine.  History of bleeding problems or blood clots.  Previous surgery.  Other health problems,  including diabetes and kidney problems.  Possibility of pregnancy, if this applies. RISKS AND COMPLICATIONS  Infection.  Inflammation of the eyeball (endophthalmitis) that can spread to both eyes (sympathetic ophthalmia).  Poor wound healing.  If an IOL is inserted, it can later fall out of proper position. This is very uncommon.  Clouding of the part of your eye that holds an IOL in place. This is called an "after-cataract." These are uncommon, but easily treated. BEFORE THE PROCEDURE  Do not eat or drink anything except small amounts of water for 8 to 12 before your surgery, or as directed by your caregiver.  Unless you are told otherwise, continue any eyedrops you have been prescribed.  Talk to your primary caregiver about all other medicines that you take (both prescription and non-prescription). In some cases, you may need to stop or change medicines near the time of your surgery. This is most important if you are taking blood-thinning medicine.Do not stop medicines unless you are told to do so.  Arrange for someone to drive you to and from the procedure.  Do not put contact lenses in either eye on the day of your surgery. PROCEDURE There is more than one method for safely removing a cataract. Your doctor can explain the differences and help determine which is best for you. Phacoemulsification surgery is the most common form of cataract surgery.  An injection is given behind the eye or eyedrops are given to make this a painless procedure.  A small cut (incision) is  made on the edge of the clear, dome-shaped surface that covers the front of the eye (cornea).  A tiny probe is painlessly inserted into the eye. This device gives off ultrasound waves that soften and break up the cloudy center of the lens. This makes it easier for the cloudy lens to be removed by suction.  An IOL may be implanted.  The normal lens of the eye is covered by a clear capsule. Part of that capsule is  intentionally left in the eye to support the IOL.  Your surgeon may or may not use stitches to close the incision. There are other forms of cataract surgery that require a larger incision and stiches to close the eye. This approach is taken in cases where the doctor feels that the cataract cannot be easily removed using phacoemulsification. AFTER THE PROCEDURE  When an IOL is implanted, it does not need care. It becomes a permanent part of your eye and cannot be seen or felt.  Your doctor will schedule follow-up exams to check on your progress.  Review your other medicines with your doctor to see which can be resumed after surgery.  Use eyedrops or take medicine as prescribed by your doctor. Document Released: 09/13/2011 Document Revised: 12/17/2011 Document Reviewed: 09/13/2011 [facility] Patient Information 2014 [place], [place].  PATIENT INSTRUCTIONS POST-ANESTHESIA  IMMEDIATELY FOLLOWING SURGERY:  Do not drive or operate machinery for the first twenty four hours after surgery.  Do not make any important decisions for twenty four hours after surgery or while taking narcotic pain medications or sedatives.  If you develop intractable nausea and vomiting or a severe headache please notify your doctor immediately.  FOLLOW-UP:  Please make an appointment with your surgeon as instructed. You do not need to follow up with anesthesia unless specifically instructed to do so.  WOUND CARE INSTRUCTIONS (if applicable):  Keep a dry clean dressing on the anesthesia/puncture wound site if there is drainage.  Once the wound has quit draining you may leave it open to air.  Generally you should leave the bandage intact for twenty four hours unless there is drainage.  If the epidural site drains for more than 36-48 hours please call the anesthesia department.  QUESTIONS?:  Please feel free to call your physician or the hospital operator if you have any questions, and they will be happy to assist you.

## 2013-03-24 ENCOUNTER — Encounter (HOSPITAL_COMMUNITY): Payer: Self-pay

## 2013-03-24 ENCOUNTER — Ambulatory Visit (HOSPITAL_COMMUNITY)
Admission: RE | Admit: 2013-03-24 | Discharge: 2013-03-24 | Disposition: A | Discharge status: Home / Self Care | Payer: Medicare Other | Source: Ambulatory Visit | Attending: Ophthalmology | Admitting: Ophthalmology

## 2013-03-24 ENCOUNTER — Encounter (HOSPITAL_COMMUNITY)
Admission: RE | Admit: 2013-03-24 | Discharge: 2013-03-24 | Disposition: A | Discharge status: Home / Self Care | Payer: Medicare Other | Source: Ambulatory Visit | Attending: Ophthalmology | Admitting: Ophthalmology

## 2013-03-24 HISTORY — DX: Pneumonia, unspecified organism: J18.9

## 2013-03-24 HISTORY — DX: Unspecified osteoarthritis, unspecified site: M19.90

## 2013-03-24 HISTORY — DX: Occlusion and stenosis of unspecified carotid artery: I65.29

## 2013-03-24 HISTORY — DX: Hypothyroidism, unspecified: E03.9

## 2013-03-24 HISTORY — DX: Angina pectoris, unspecified: I20.9

## 2013-03-24 LAB — BASIC METABOLIC PANEL
CO2: 32 mEq/L (ref 19–32)
Calcium: 8.8 mg/dL (ref 8.4–10.5)
Chloride: 94 mEq/L — ABNORMAL LOW (ref 96–112)
Creatinine, Ser: 0.96 mg/dL (ref 0.50–1.10)
Glucose, Bld: 124 mg/dL — ABNORMAL HIGH (ref 70–99)

## 2013-03-27 ENCOUNTER — Telehealth: Payer: Self-pay | Admitting: *Deleted

## 2013-03-27 MED ORDER — LIDOCAINE HCL (PF) 1 % IJ SOLN
INTRAMUSCULAR | Status: AC
  Filled 2013-03-27: qty 2

## 2013-03-27 MED ORDER — TETRACAINE HCL 0.5 % OP SOLN
OPHTHALMIC | Status: AC
  Filled 2013-03-27: qty 2

## 2013-03-27 MED ORDER — LIDOCAINE HCL 3.5 % OP GEL
OPHTHALMIC | Status: AC
  Filled 2013-03-27: qty 5

## 2013-03-27 MED ORDER — NEOMYCIN-POLYMYXIN-DEXAMETH 3.5-10000-0.1 OP OINT
TOPICAL_OINTMENT | OPHTHALMIC | Status: AC
  Filled 2013-03-27: qty 3.5

## 2013-03-27 MED ORDER — CYCLOPENTOLATE-PHENYLEPHRINE 0.2-1 % OP SOLN
OPHTHALMIC | Status: AC
  Filled 2013-03-27: qty 2

## 2013-03-27 NOTE — Telephone Encounter (Signed)
LMTCB.  Patient called in to triage re: dizziness and to report that she is having cataract surgery on Monday. I left message for her to call us back if needed.

## 2013-03-30 ENCOUNTER — Encounter (HOSPITAL_COMMUNITY): Payer: Self-pay | Admitting: Anesthesiology

## 2013-03-30 ENCOUNTER — Ambulatory Visit (HOSPITAL_COMMUNITY)
Admission: RE | Admit: 2013-03-30 | Discharge: 2013-03-30 | Disposition: A | Discharge status: Home / Self Care | Payer: Medicare Other | Source: Ambulatory Visit | Attending: Ophthalmology | Admitting: Ophthalmology

## 2013-03-30 ENCOUNTER — Encounter (HOSPITAL_COMMUNITY)
Admission: RE | Disposition: A | Discharge status: Home / Self Care | Payer: Self-pay | Source: Ambulatory Visit | Attending: Ophthalmology

## 2013-03-30 ENCOUNTER — Encounter (HOSPITAL_COMMUNITY): Payer: Self-pay | Admitting: *Deleted

## 2013-03-30 ENCOUNTER — Ambulatory Visit (HOSPITAL_COMMUNITY): Payer: Medicare Other | Admitting: Anesthesiology

## 2013-03-30 DIAGNOSIS — Z794 Long term (current) use of insulin: Secondary | ICD-10-CM | POA: Insufficient documentation

## 2013-03-30 DIAGNOSIS — Z01812 Encounter for preprocedural laboratory examination: Secondary | ICD-10-CM | POA: Insufficient documentation

## 2013-03-30 DIAGNOSIS — E119 Type 2 diabetes mellitus without complications: Secondary | ICD-10-CM | POA: Insufficient documentation

## 2013-03-30 DIAGNOSIS — G4733 Obstructive sleep apnea (adult) (pediatric): Secondary | ICD-10-CM | POA: Insufficient documentation

## 2013-03-30 DIAGNOSIS — I10 Essential (primary) hypertension: Secondary | ICD-10-CM | POA: Insufficient documentation

## 2013-03-30 DIAGNOSIS — H2510 Age-related nuclear cataract, unspecified eye: Secondary | ICD-10-CM | POA: Insufficient documentation

## 2013-03-30 HISTORY — PX: EYE SURGERY: SHX253

## 2013-03-30 HISTORY — DX: Dizziness and giddiness: R42

## 2013-03-30 HISTORY — DX: Nausea with vomiting, unspecified: Z98.890

## 2013-03-30 HISTORY — DX: Nausea with vomiting, unspecified: R11.2

## 2013-03-30 HISTORY — PX: CATARACT EXTRACTION W/PHACO: SHX586

## 2013-03-30 LAB — GLUCOSE, CAPILLARY: Glucose-Capillary: 157 mg/dL — ABNORMAL HIGH (ref 70–99)

## 2013-03-30 SURGERY — PHACOEMULSIFICATION, CATARACT, WITH IOL INSERTION
Anesthesia: Monitor Anesthesia Care | Site: Eye | Laterality: Left | Wound class: Clean

## 2013-03-30 MED ORDER — EPINEPHRINE HCL 1 MG/ML IJ SOLN
INTRAOCULAR | Status: DC | PRN
  Administered 2013-03-30: 13:00:00

## 2013-03-30 MED ORDER — TETRACAINE HCL 0.5 % OP SOLN
1.0000 [drp] | OPHTHALMIC | Status: AC
  Administered 2013-03-30 (×3): 1 [drp] via OPHTHALMIC

## 2013-03-30 MED ORDER — MIDAZOLAM HCL 2 MG/2ML IJ SOLN
INTRAMUSCULAR | Status: AC
  Filled 2013-03-30: qty 2

## 2013-03-30 MED ORDER — CYCLOPENTOLATE-PHENYLEPHRINE 0.2-1 % OP SOLN
1.0000 [drp] | OPHTHALMIC | Status: AC
  Administered 2013-03-30 (×3): 1 [drp] via OPHTHALMIC

## 2013-03-30 MED ORDER — BSS IO SOLN
INTRAOCULAR | Status: DC | PRN
  Administered 2013-03-30: 15 mL via INTRAOCULAR

## 2013-03-30 MED ORDER — LIDOCAINE HCL (PF) 1 % IJ SOLN
INTRAOCULAR | Status: DC | PRN
  Administered 2013-03-30: 13:00:00 via OPHTHALMIC

## 2013-03-30 MED ORDER — POVIDONE-IODINE 5 % OP SOLN
OPHTHALMIC | Status: DC | PRN
  Administered 2013-03-30: 1 via OPHTHALMIC

## 2013-03-30 MED ORDER — PROVISC 10 MG/ML IO SOLN
INTRAOCULAR | Status: DC | PRN
  Administered 2013-03-30: 8.5 mg via INTRAOCULAR

## 2013-03-30 MED ORDER — LIDOCAINE HCL 3.5 % OP GEL
1.0000 "application " | Freq: Once | OPHTHALMIC | Status: AC
  Administered 2013-03-30: 1 via OPHTHALMIC

## 2013-03-30 MED ORDER — PHENYLEPHRINE HCL 2.5 % OP SOLN
1.0000 [drp] | OPHTHALMIC | Status: AC
  Administered 2013-03-30 (×3): 1 [drp] via OPHTHALMIC

## 2013-03-30 MED ORDER — ONDANSETRON HCL 4 MG/2ML IJ SOLN
4.0000 mg | Freq: Once | INTRAMUSCULAR | Status: AC
  Administered 2013-03-30: 4 mg via INTRAVENOUS

## 2013-03-30 MED ORDER — ONDANSETRON HCL 4 MG/2ML IJ SOLN
INTRAMUSCULAR | Status: AC
  Filled 2013-03-30: qty 2

## 2013-03-30 MED ORDER — LACTATED RINGERS IV SOLN
INTRAVENOUS | Status: DC
  Administered 2013-03-30: 13:00:00 via INTRAVENOUS

## 2013-03-30 MED ORDER — NEOMYCIN-POLYMYXIN-DEXAMETH 0.1 % OP OINT
TOPICAL_OINTMENT | OPHTHALMIC | Status: DC | PRN
  Administered 2013-03-30: 1 via OPHTHALMIC

## 2013-03-30 MED ORDER — MIDAZOLAM HCL 2 MG/2ML IJ SOLN
1.0000 mg | INTRAMUSCULAR | Status: DC | PRN
Start: 2013-03-30 — End: 2013-03-30
  Administered 2013-03-30: 2 mg via INTRAVENOUS

## 2013-03-30 MED ORDER — EPINEPHRINE HCL 1 MG/ML IJ SOLN
INTRAMUSCULAR | Status: AC
  Filled 2013-03-30: qty 1

## 2013-03-30 SURGICAL SUPPLY — 31 items
CAPSULAR TENSION RING-AMO (OPHTHALMIC RELATED) IMPLANT
CLOTH BEACON ORANGE TIMEOUT ST (SAFETY) ×1 IMPLANT
EYE SHIELD UNIVERSAL CLEAR (GAUZE/BANDAGES/DRESSINGS) ×1 IMPLANT
GLOVE BIO SURGEON STRL SZ 6.5 (GLOVE) IMPLANT
GLOVE BIOGEL PI IND STRL 6.5 (GLOVE) IMPLANT
GLOVE BIOGEL PI IND STRL 7.0 (GLOVE) IMPLANT
GLOVE BIOGEL PI IND STRL 7.5 (GLOVE) IMPLANT
GLOVE BIOGEL PI INDICATOR 6.5 (GLOVE) ×1
GLOVE BIOGEL PI INDICATOR 7.0 (GLOVE) ×1
GLOVE BIOGEL PI INDICATOR 7.5 (GLOVE)
GLOVE ECLIPSE 6.5 STRL STRAW (GLOVE) IMPLANT
GLOVE ECLIPSE 7.0 STRL STRAW (GLOVE) IMPLANT
GLOVE ECLIPSE 7.5 STRL STRAW (GLOVE) IMPLANT
GLOVE EXAM NITRILE LRG STRL (GLOVE) IMPLANT
GLOVE EXAM NITRILE MD LF STRL (GLOVE) IMPLANT
GLOVE SKINSENSE NS SZ6.5 (GLOVE)
GLOVE SKINSENSE NS SZ7.0 (GLOVE)
GLOVE SKINSENSE STRL SZ6.5 (GLOVE) IMPLANT
GLOVE SKINSENSE STRL SZ7.0 (GLOVE) IMPLANT
KIT VITRECTOMY (OPHTHALMIC RELATED) IMPLANT
PAD ARMBOARD 7.5X6 YLW CONV (MISCELLANEOUS) ×1 IMPLANT
PROC W NO LENS (INTRAOCULAR LENS)
PROC W SPEC LENS (INTRAOCULAR LENS)
PROCESS W NO LENS (INTRAOCULAR LENS) IMPLANT
PROCESS W SPEC LENS (INTRAOCULAR LENS) IMPLANT
RING MALYGIN (MISCELLANEOUS) IMPLANT
SIGHTPATH CAT PROC W REG LENS (Ophthalmic Related) ×2 IMPLANT
SYR TB 1ML LL NO SAFETY (SYRINGE) ×1 IMPLANT
TAPE CLOTH SOFT 2X10 (GAUZE/BANDAGES/DRESSINGS) ×1 IMPLANT
VISCOELASTIC ADDITIONAL (OPHTHALMIC RELATED) IMPLANT
WATER STERILE IRR 250ML POUR (IV SOLUTION) ×1 IMPLANT

## 2013-03-30 NOTE — Anesthesia Preprocedure Evaluation (Signed)
Anesthesia Evaluation  Patient identified by MRN, date of birth, ID band Patient awake    Reviewed: Allergy & Precautions, H&P , NPO status , Patient's Chart, lab work & pertinent test results  Airway Mallampati: II TM Distance: >3 FB     Dental  (+) Teeth Intact   Pulmonary shortness of breath, sleep apnea , pneumonia -, resolved,  breath sounds clear to auscultation        Cardiovascular hypertension, + angina + CAD and + Peripheral Vascular Disease Rhythm:Regular Rate:Normal     Neuro/Psych PSYCHIATRIC DISORDERS Anxiety Depression  Neuromuscular disease    GI/Hepatic hiatal hernia, GERD-  Medicated and Controlled,  Endo/Other  diabetes, Well Controlled, Type 2, Insulin DependentHypothyroidism   Renal/GU Renal disease     Musculoskeletal  (+) Fibromyalgia -  Abdominal   Peds  Hematology   Anesthesia Other Findings   Reproductive/Obstetrics                           Anesthesia Physical Anesthesia Plan  ASA: III  Anesthesia Plan: MAC   Post-op Pain Management:    Induction: Intravenous  Airway Management Planned: Nasal Cannula  Additional Equipment:   Intra-op Plan:   Post-operative Plan:   Informed Consent: I have reviewed the patients History and Physical, chart, labs and discussed the procedure including the risks, benefits and alternatives for the proposed anesthesia with the patient or authorized representative who has indicated his/her understanding and acceptance.     Plan Discussed with:   Anesthesia Plan Comments:         Anesthesia Quick Evaluation

## 2013-03-30 NOTE — Anesthesia Postprocedure Evaluation (Signed)
  Anesthesia Post-op Note  Patient: Jeanne Kim  Procedure(s) Performed: Procedure(s) with comments: CATARACT EXTRACTION PHACO AND INTRAOCULAR LENS PLACEMENT (IOC) (Left) - CDE:19.28  Patient Location: PACU and Short Stay  Anesthesia Type:MAC  Level of Consciousness: awake, alert  and oriented  Airway and Oxygen Therapy: Patient Spontanous Breathing  Post-op Pain: none  Post-op Assessment: Post-op Vital signs reviewed, Patient's Cardiovascular Status Stable, Respiratory Function Stable, Patent Airway and No signs of Nausea or vomiting  Post-op Vital Signs: Reviewed and stable  Complications: No apparent anesthesia complications

## 2013-03-30 NOTE — Op Note (Signed)
Date of Admission: 03/30/2013  Date of Surgery: 03/30/2013  Pre-Op Dx: Cataract  Left  Eye  Post-Op Dx: Cataract  Left  Eye,  Dx Code 366.16  Surgeon: Gemma Payor, M.D.  Assistants: None  Anesthesia: Topical with MAC  Indications: Painless, progressive loss of vision with compromise of daily activities.  Surgery: Cataract Extraction with Intraocular lens Implant Left Eye  Discription: The patient had dilating drops and viscous lidocaine placed into the left eye in the pre-op holding area. After transfer to the operating room, a time out was performed. The patient was then prepped and draped. Beginning with a 75 degree blade a paracentesis port was made at the surgeon's 2 o'clock position. The anterior chamber was then filled with 1% non-preserved lidocaine. This was followed by filling the anterior chamber with Provisc. A bent cystatome needle was used to create a continuous tear capsulotomy. Hydrodissection was performed with balanced salt solution on a Fine canula. The lens nucleus was then removed using the phacoemulsification handpiece. Residual cortex was removed with the I&A handpiece. The anterior chamber and capsular bag were refilled with Provisc. A posterior chamber intraocular lens was placed into the capsular bag with it's injector. The implant was positioned with the Kuglan hook. The Provisc was then removed from the anterior chamber and capsular bag with the I&A handpiece. Stromal hydration of the main incision and paracentesis port was performed with BSS on a Fine canula. The wounds were tested for leak which was negative. The patient tolerated the procedure well. There were no operative complications. The patient was then transferred to the recovery room in stable condition.  Complications: None  Specimen: None  EBL: None  Prosthetic device: B&L enVista, MX60, power 23.0D, SN [id number].

## 2013-03-30 NOTE — H&P (Signed)
I have reviewed the H&P, the patient was re-examined, and I have identified no interval changes in medical condition and plan of care since the history and physical of record  

## 2013-03-30 NOTE — Transfer of Care (Signed)
Immediate Anesthesia Transfer of Care Note  Patient: Jeanne Kim  Procedure(s) Performed: Procedure(s) with comments: CATARACT EXTRACTION PHACO AND INTRAOCULAR LENS PLACEMENT (IOC) (Left) - CDE:19.28  Patient Location: PACU and Short Stay  Anesthesia Type:MAC  Level of Consciousness: awake  Airway & Oxygen Therapy: Patient Spontanous Breathing  Post-op Assessment: Report given to PACU RN  Post vital signs: Reviewed  Complications: No apparent anesthesia complications

## 2013-03-31 ENCOUNTER — Encounter (HOSPITAL_COMMUNITY): Payer: Self-pay | Admitting: Ophthalmology

## 2013-04-01 ENCOUNTER — Other Ambulatory Visit (HOSPITAL_COMMUNITY): Payer: Medicare Other

## 2013-04-02 ENCOUNTER — Encounter (HOSPITAL_COMMUNITY): Payer: Self-pay | Admitting: Pharmacy Technician

## 2013-04-07 ENCOUNTER — Encounter (HOSPITAL_COMMUNITY)
Admission: RE | Admit: 2013-04-07 | Discharge: 2013-04-07 | Disposition: A | Discharge status: Home / Self Care | Payer: Medicare Other | Source: Ambulatory Visit | Attending: Ophthalmology | Admitting: Ophthalmology

## 2013-04-07 NOTE — Progress Notes (Signed)
Dr. Jayme Cloud consulted about preop instruction for diabetic patient. Pt will be instructed to drink 4 oz of apple juice with morning meds and to take them around 0600.

## 2013-04-08 MED ORDER — CYCLOPENTOLATE-PHENYLEPHRINE 0.2-1 % OP SOLN
OPHTHALMIC | Status: AC
  Filled 2013-04-08: qty 2

## 2013-04-08 MED ORDER — FENTANYL CITRATE 0.05 MG/ML IJ SOLN: 25.0000 ug | INTRAMUSCULAR | Status: DC | PRN

## 2013-04-08 MED ORDER — TETRACAINE HCL 0.5 % OP SOLN
OPHTHALMIC | Status: AC
  Filled 2013-04-08: qty 2

## 2013-04-08 MED ORDER — LIDOCAINE HCL 3.5 % OP GEL
OPHTHALMIC | Status: AC
  Filled 2013-04-08: qty 5

## 2013-04-08 MED ORDER — NEOMYCIN-POLYMYXIN-DEXAMETH 3.5-10000-0.1 OP OINT
TOPICAL_OINTMENT | OPHTHALMIC | Status: AC
  Filled 2013-04-08: qty 3.5

## 2013-04-08 MED ORDER — ONDANSETRON HCL 4 MG/2ML IJ SOLN: 4.0000 mg | Freq: Once | INTRAMUSCULAR | Status: AC | PRN

## 2013-04-08 MED ORDER — LIDOCAINE HCL (PF) 1 % IJ SOLN
INTRAMUSCULAR | Status: AC
  Filled 2013-04-08: qty 2

## 2013-04-09 ENCOUNTER — Ambulatory Visit (HOSPITAL_COMMUNITY)
Admission: RE | Admit: 2013-04-09 | Discharge: 2013-04-09 | Disposition: A | Discharge status: Home / Self Care | Payer: Medicare Other | Source: Ambulatory Visit | Attending: Ophthalmology | Admitting: Ophthalmology

## 2013-04-09 ENCOUNTER — Encounter: Payer: Medicare Other | Admitting: Cardiovascular Disease

## 2013-04-09 ENCOUNTER — Encounter (HOSPITAL_COMMUNITY)
Admission: RE | Disposition: A | Discharge status: Home / Self Care | Payer: Self-pay | Source: Ambulatory Visit | Attending: Ophthalmology

## 2013-04-09 ENCOUNTER — Encounter (HOSPITAL_COMMUNITY): Payer: Self-pay | Admitting: Anesthesiology

## 2013-04-09 ENCOUNTER — Encounter (HOSPITAL_COMMUNITY): Payer: Self-pay | Admitting: *Deleted

## 2013-04-09 ENCOUNTER — Ambulatory Visit (HOSPITAL_COMMUNITY): Payer: Medicare Other | Admitting: Anesthesiology

## 2013-04-09 DIAGNOSIS — Z01812 Encounter for preprocedural laboratory examination: Secondary | ICD-10-CM | POA: Insufficient documentation

## 2013-04-09 DIAGNOSIS — I10 Essential (primary) hypertension: Secondary | ICD-10-CM | POA: Insufficient documentation

## 2013-04-09 DIAGNOSIS — Z794 Long term (current) use of insulin: Secondary | ICD-10-CM | POA: Insufficient documentation

## 2013-04-09 DIAGNOSIS — Z79899 Other long term (current) drug therapy: Secondary | ICD-10-CM | POA: Insufficient documentation

## 2013-04-09 DIAGNOSIS — H2510 Age-related nuclear cataract, unspecified eye: Secondary | ICD-10-CM | POA: Insufficient documentation

## 2013-04-09 DIAGNOSIS — E119 Type 2 diabetes mellitus without complications: Secondary | ICD-10-CM | POA: Insufficient documentation

## 2013-04-09 HISTORY — PX: CATARACT EXTRACTION W/PHACO: SHX586

## 2013-04-09 HISTORY — PX: EYE SURGERY: SHX253

## 2013-04-09 SURGERY — PHACOEMULSIFICATION, CATARACT, WITH IOL INSERTION
Anesthesia: Monitor Anesthesia Care | Site: Eye | Laterality: Right | Wound class: Clean

## 2013-04-09 MED ORDER — BSS IO SOLN
INTRAOCULAR | Status: DC | PRN
  Administered 2013-04-09: 15 mL via INTRAOCULAR

## 2013-04-09 MED ORDER — LIDOCAINE HCL (PF) 1 % IJ SOLN
INTRAMUSCULAR | Status: DC | PRN
  Administered 2013-04-09: .4 mL

## 2013-04-09 MED ORDER — PROVISC 10 MG/ML IO SOLN
INTRAOCULAR | Status: DC | PRN
  Administered 2013-04-09: 8.5 mg via INTRAOCULAR

## 2013-04-09 MED ORDER — MIDAZOLAM HCL 2 MG/2ML IJ SOLN
INTRAMUSCULAR | Status: AC
  Filled 2013-04-09: qty 2

## 2013-04-09 MED ORDER — LACTATED RINGERS IV SOLN
INTRAVENOUS | Status: DC
  Administered 2013-04-09: 14:00:00 via INTRAVENOUS

## 2013-04-09 MED ORDER — PHENYLEPHRINE HCL 2.5 % OP SOLN
OPHTHALMIC | Status: AC
  Filled 2013-04-09: qty 15

## 2013-04-09 MED ORDER — NEOMYCIN-POLYMYXIN-DEXAMETH 0.1 % OP OINT
TOPICAL_OINTMENT | OPHTHALMIC | Status: DC | PRN
  Administered 2013-04-09: 1 via OPHTHALMIC

## 2013-04-09 MED ORDER — PHENYLEPHRINE HCL 2.5 % OP SOLN
1.0000 [drp] | OPHTHALMIC | Status: AC
  Administered 2013-04-09 (×3): 1 [drp] via OPHTHALMIC

## 2013-04-09 MED ORDER — MIDAZOLAM HCL 2 MG/2ML IJ SOLN
1.0000 mg | INTRAMUSCULAR | Status: DC | PRN
  Administered 2013-04-09: 2 mg via INTRAVENOUS

## 2013-04-09 MED ORDER — POVIDONE-IODINE 5 % OP SOLN
OPHTHALMIC | Status: DC | PRN
  Administered 2013-04-09: 1 via OPHTHALMIC

## 2013-04-09 MED ORDER — EPINEPHRINE HCL 1 MG/ML IJ SOLN
INTRAMUSCULAR | Status: AC
  Filled 2013-04-09: qty 1

## 2013-04-09 MED ORDER — CYCLOPENTOLATE-PHENYLEPHRINE 0.2-1 % OP SOLN
1.0000 [drp] | OPHTHALMIC | Status: AC
  Administered 2013-04-09 (×3): 1 [drp] via OPHTHALMIC

## 2013-04-09 MED ORDER — TETRACAINE HCL 0.5 % OP SOLN
1.0000 [drp] | OPHTHALMIC | Status: AC
  Administered 2013-04-09 (×3): 1 [drp] via OPHTHALMIC

## 2013-04-09 MED ORDER — LIDOCAINE HCL 3.5 % OP GEL
1.0000 "application " | Freq: Once | OPHTHALMIC | Status: AC
  Administered 2013-04-09: 1 via OPHTHALMIC

## 2013-04-09 MED ORDER — MIDAZOLAM HCL 5 MG/5ML IJ SOLN
INTRAMUSCULAR | Status: DC | PRN
  Administered 2013-04-09: 1 mg via INTRAVENOUS

## 2013-04-09 MED ORDER — EPINEPHRINE HCL 1 MG/ML IJ SOLN
INTRAOCULAR | Status: DC | PRN
  Administered 2013-04-09: 14:00:00

## 2013-04-09 SURGICAL SUPPLY — 33 items
CAPSULAR TENSION RING-AMO (OPHTHALMIC RELATED) IMPLANT
CLOTH BEACON ORANGE TIMEOUT ST (SAFETY) ×1 IMPLANT
EYE SHIELD UNIVERSAL CLEAR (GAUZE/BANDAGES/DRESSINGS) ×1 IMPLANT
GLOVE BIO SURGEON STRL SZ 6.5 (GLOVE) IMPLANT
GLOVE BIOGEL PI IND STRL 6.5 (GLOVE) IMPLANT
GLOVE BIOGEL PI IND STRL 7.0 (GLOVE) IMPLANT
GLOVE BIOGEL PI IND STRL 7.5 (GLOVE) IMPLANT
GLOVE BIOGEL PI INDICATOR 6.5 (GLOVE)
GLOVE BIOGEL PI INDICATOR 7.0 (GLOVE) ×3
GLOVE BIOGEL PI INDICATOR 7.5 (GLOVE)
GLOVE ECLIPSE 6.5 STRL STRAW (GLOVE) IMPLANT
GLOVE ECLIPSE 7.0 STRL STRAW (GLOVE) IMPLANT
GLOVE ECLIPSE 7.5 STRL STRAW (GLOVE) IMPLANT
GLOVE EXAM NITRILE LRG STRL (GLOVE) IMPLANT
GLOVE EXAM NITRILE MD LF STRL (GLOVE) IMPLANT
GLOVE SKINSENSE NS SZ6.5 (GLOVE)
GLOVE SKINSENSE NS SZ7.0 (GLOVE)
GLOVE SKINSENSE STRL SZ6.5 (GLOVE) IMPLANT
GLOVE SKINSENSE STRL SZ7.0 (GLOVE) IMPLANT
GOWN STRL REIN XL XLG (GOWN DISPOSABLE) ×1 IMPLANT
KIT VITRECTOMY (OPHTHALMIC RELATED) IMPLANT
PAD ARMBOARD 7.5X6 YLW CONV (MISCELLANEOUS) ×1 IMPLANT
PROC W NO LENS (INTRAOCULAR LENS)
PROC W SPEC LENS (INTRAOCULAR LENS)
PROCESS W NO LENS (INTRAOCULAR LENS) IMPLANT
PROCESS W SPEC LENS (INTRAOCULAR LENS) IMPLANT
RING MALYGIN (MISCELLANEOUS) IMPLANT
SIGHTPATH CAT PROC W REG LENS (Ophthalmic Related) ×2 IMPLANT
SYR TB 1ML LL NO SAFETY (SYRINGE) ×1 IMPLANT
TAPE SURG TRANSPORE 1 IN (GAUZE/BANDAGES/DRESSINGS) IMPLANT
TAPE SURGICAL TRANSPORE 1 IN (GAUZE/BANDAGES/DRESSINGS) ×1
VISCOELASTIC ADDITIONAL (OPHTHALMIC RELATED) IMPLANT
WATER STERILE IRR 250ML POUR (IV SOLUTION) ×1 IMPLANT

## 2013-04-09 NOTE — Anesthesia Postprocedure Evaluation (Signed)
  Anesthesia Post-op Note  Patient: Jeanne Kim  Procedure(s) Performed: Procedure(s) with comments: CATARACT EXTRACTION PHACO AND INTRAOCULAR LENS PLACEMENT (IOC) (Right) - CDE: 15.44  Patient Location: PACU and Short Stay  Anesthesia Type:General  Level of Consciousness: awake, alert  and oriented  Airway and Oxygen Therapy: Patient Spontanous Breathing  Post-op Pain: none  Post-op Assessment: Post-op Vital signs reviewed, Patient's Cardiovascular Status Stable, Respiratory Function Stable, Patent Airway and No signs of Nausea or vomiting  Post-op Vital Signs: Reviewed and stable  Complications: No apparent anesthesia complications

## 2013-04-09 NOTE — Op Note (Signed)
Date of Admission: 04/09/2013  Date of Surgery: 04/09/2013  Pre-Op Dx: Cataract  Right  Eye  Post-Op Dx: Cataract  Right  Eye,  Dx Code 366.16  Surgeon: Gemma Payor, M.D.  Assistants: None  Anesthesia: Topical with MAC  Indications: Painless, progressive loss of vision with compromise of daily activities.  Surgery: Cataract Extraction with Intraocular lens Implant Right Eye  Discription: The patient had dilating drops and viscous lidocaine placed into the left eye in the pre-op holding area. After transfer to the operating room, a time out was performed. The patient was then prepped and draped. Beginning with a 75 degree blade a paracentesis port was made at the surgeon's 2 o'clock position. The anterior chamber was then filled with 1% non-preserved lidocaine. This was followed by filling the anterior chamber with Provisc. A bent cystatome needle was used to create a continuous tear capsulotomy. Hydrodissection was performed with balanced salt solution on a Fine canula. The lens nucleus was then removed using the phacoemulsification handpiece. Residual cortex was removed with the I&A handpiece. The anterior chamber and capsular bag were refilled with Provisc. A posterior chamber intraocular lens was placed into the capsular bag with it's injector. The implant was positioned with the Kuglan hook. The Provisc was then removed from the anterior chamber and capsular bag with the I&A handpiece. Stromal hydration of the main incision and paracentesis port was performed with BSS on a Fine canula. The wounds were tested for leak which was negative. The patient tolerated the procedure well. There were no operative complications. The patient was then transferred to the recovery room in stable condition.  Complications: None  Specimen: None  EBL: None  Prosthetic device: B&L enVista, MX60, power 24.0D, #[id number].

## 2013-04-09 NOTE — Consult Note (Signed)
I have reviewed the H&P, the patient was re-examined, and I have identified no interval changes in medical condition and plan of care since the history and physical of record  

## 2013-04-09 NOTE — Anesthesia Preprocedure Evaluation (Signed)
Anesthesia Evaluation  Patient identified by MRN, date of birth, ID band Patient awake    Reviewed: Allergy & Precautions, H&P , NPO status , Patient's Chart, lab work & pertinent test results  Airway Mallampati: II TM Distance: >3 FB     Dental  (+) Teeth Intact   Pulmonary shortness of breath, sleep apnea , pneumonia -, resolved,  breath sounds clear to auscultation        Cardiovascular hypertension, + angina + CAD and + Peripheral Vascular Disease Rhythm:Regular Rate:Normal     Neuro/Psych PSYCHIATRIC DISORDERS Anxiety Depression  Neuromuscular disease    GI/Hepatic hiatal hernia, GERD-  Medicated and Controlled,  Endo/Other  diabetes, Well Controlled, Type 2, Insulin DependentHypothyroidism   Renal/GU Renal disease     Musculoskeletal  (+) Fibromyalgia -  Abdominal   Peds  Hematology   Anesthesia Other Findings   Reproductive/Obstetrics                           Anesthesia Physical Anesthesia Plan  ASA: III  Anesthesia Plan: MAC   Post-op Pain Management:    Induction: Intravenous  Airway Management Planned: Nasal Cannula  Additional Equipment:   Intra-op Plan:   Post-operative Plan:   Informed Consent: I have reviewed the patients History and Physical, chart, labs and discussed the procedure including the risks, benefits and alternatives for the proposed anesthesia with the patient or authorized representative who has indicated his/her understanding and acceptance.     Plan Discussed with:   Anesthesia Plan Comments:         Anesthesia Quick Evaluation

## 2013-04-09 NOTE — Transfer of Care (Signed)
Immediate Anesthesia Transfer of Care Note  Patient: Jeanne Kim  Procedure(s) Performed: Procedure(s) with comments: CATARACT EXTRACTION PHACO AND INTRAOCULAR LENS PLACEMENT (IOC) (Right) - CDE: 15.44  Patient Location: PACU and Short Stay  Anesthesia Type:MAC  Level of Consciousness: awake, alert  and oriented  Airway & Oxygen Therapy: Patient Spontanous Breathing  Post-op Assessment: Report given to PACU RN  Post vital signs: Reviewed and stable  Complications: No apparent anesthesia complications

## 2013-04-13 ENCOUNTER — Encounter (HOSPITAL_COMMUNITY): Payer: Self-pay | Admitting: Ophthalmology

## 2013-04-27 ENCOUNTER — Other Ambulatory Visit (HOSPITAL_COMMUNITY): Payer: Medicare Other

## 2013-04-27 ENCOUNTER — Encounter: Payer: Self-pay | Admitting: Vascular Surgery

## 2013-04-28 ENCOUNTER — Encounter: Payer: Self-pay | Admitting: Vascular Surgery

## 2013-04-28 ENCOUNTER — Ambulatory Visit (INDEPENDENT_AMBULATORY_CARE_PROVIDER_SITE_OTHER): Payer: Medicare Other | Admitting: Vascular Surgery

## 2013-04-28 ENCOUNTER — Other Ambulatory Visit (INDEPENDENT_AMBULATORY_CARE_PROVIDER_SITE_OTHER): Payer: Medicare Other | Admitting: *Deleted

## 2013-04-28 DIAGNOSIS — I6529 Occlusion and stenosis of unspecified carotid artery: Secondary | ICD-10-CM

## 2013-04-28 NOTE — Addendum Note (Signed)
Addended by: Adria Dill L on: 04/28/2013 03:54 PM   Modules accepted: Orders

## 2013-04-28 NOTE — Progress Notes (Signed)
The patient presents today for followup of extracranial cerebrovascular occlusive disease. I seen her 6 month ago where she had an outside carotid duplex suggesting moderate to severe stenosis. She did have significant tortuosity which have failed may falsely elevate her velocity readings. She does continue to have orthostatic hypotension and is dizzy on the fascia rising. She has fallen on one occasion with no injury I do to this. She has adjusted by taking care when she first advanced to acclimate before walking. She denies any focal neurologic deficits.  Past Medical History  Diagnosis Date  . Esophageal reflux   . Hiatal hernia   . Unspecified gastritis and gastroduodenitis without mention of hemorrhage   . Esophageal stricture   . Type II or unspecified type diabetes mellitus without mention of complication, not stated as uncontrolled   . Fibromyalgia   . Hypertension   . Anxiety   . Depression   . Sleep apnea     CPAP  . Kidney stones   . Hypercholesterolemia   . Thyroid disease   . IBS (irritable bowel syndrome)   . Breast cancer   . Cataract   . Cataract   . Anemia   . Coronary heart disease   . Pneumonia     recently, just finished ABO  . Carotid artery plaque     bilat  . Anginal pain   . Hypothyroidism   . Arthritis   . Dizziness   . PONV (postoperative nausea and vomiting)     History  Substance Use Topics  . Smoking status: Never Smoker   . Smokeless tobacco: Never Used  . Alcohol Use: No    Family History  Problem Relation Age of Onset  . Esophageal cancer Maternal Uncle   . Ovarian cancer Maternal Aunt   . Colon cancer Maternal Uncle   . Lung cancer Maternal Uncle   . Sarcoidosis Mother   . Lung cancer Father   . Diabetes Mother   . Diabetes Father   . Diabetes Maternal Uncle   . Diabetes Maternal Aunt   . Colon polyps Maternal Uncle   . Heart disease Father   . Heart disease Paternal Grandmother   . Urolithiasis Father     Allergies   Allergen Reactions  . Codeine Hives  . Sulfonamide Derivatives Hives    Current outpatient prescriptions:ACIPHEX 20 MG tablet, TAKE 1 TABLET EVERY DAY, Disp: 30 tablet, Rfl: 3;  ALPRAZolam (XANAX) 0.5 MG tablet, Take 0.5 mg by mouth at bedtime., Disp: , Rfl: ;  anastrozole (ARIMIDEX) 1 MG tablet, Take 1 mg by mouth daily., Disp: , Rfl: ;  aspirin 325 MG tablet, Take 325 mg by mouth daily., Disp: , Rfl: ;  atorvastatin (LIPITOR) 20 MG tablet, Take 1 tablet (20 mg total) by mouth daily., Disp: 90 tablet, Rfl: 3 Bromfenac Sodium (PROLENSA) 0.07 % SOLN, Apply 1 drop to eye 3 (three) times daily., Disp: , Rfl: ;  calcium-vitamin D (OSCAL WITH D) 500-200 MG-UNIT per tablet, Take 1 tablet by mouth 2 (two) times daily., Disp: , Rfl: ;  Coenzyme Q10 (CO Q-10) 100 MG CAPS, Take 100 mg by mouth daily., Disp: , Rfl: ;  cyanocobalamin (,VITAMIN B-12,) 1000 MCG/ML injection, Inject 1 mL (1,000 mcg total) into the muscle every 30 (thirty) days., Disp: 10 mL, Rfl: 2 diclofenac (VOLTAREN) 75 MG EC tablet, Take by mouth 2 (two) times daily. , Disp: , Rfl: ;  Difluprednate (DUREZOL) 0.05 % EMUL, Apply 1 drop to eye 3 (three) times  daily., Disp: , Rfl: ;  DULoxetine (CYMBALTA) 60 MG capsule, Take 60 mg by mouth daily., Disp: , Rfl: ;  fluticasone (FLONASE) 50 MCG/ACT nasal spray, Place 2 sprays into the nose daily., Disp: , Rfl:  glyBURIDE-metformin (GLUCOVANCE) 5-500 MG per tablet, Take 2 tablets by mouth 2 (two) times daily with a meal., Disp: , Rfl: ;  insulin detemir (LEVEMIR) 100 UNIT/ML injection, Inject 25 Units into the skin at bedtime. Sliding scale, Disp: , Rfl: ;  insulin glulisine (APIDRA) 100 UNIT/ML injection, Inject 8 Units into the skin as needed. , Disp: , Rfl:  levothyroxine (SYNTHROID, LEVOTHROID) 112 MCG tablet, Take 112 mcg by mouth daily., Disp: , Rfl: ;  metoprolol succinate (TOPROL-XL) 25 MG 24 hr tablet, Take 25 mg by mouth daily., Disp: , Rfl: ;  montelukast (SINGULAIR) 10 MG tablet, Take 10 mg by  mouth at bedtime., Disp: , Rfl: ;  ramipril (ALTACE) 5 MG capsule, Take 1 capsule (5 mg total) by mouth daily., Disp: 90 capsule, Rfl: 3 Besifloxacin HCl (BESIVANCE) 0.6 % SUSP, Apply 1 drop to eye daily., Disp: , Rfl:   BP 125/75  Pulse 70  Resp 18  Ht 5\' 5"  (1.651 m)  Wt 188 lb 8 oz (85.503 kg)  BMI 31.37 kg/m2  Body mass index is 31.37 kg/(m^2).       Physical exam well-developed well-nourished female in no acute distress Carotid arteries without bruits bilaterally. She has a well-healed left neck incision from prior cervical surgery Radial pulses are 2+ bilaterally Grossly she is neurologically intact Respirations nonlabored Skin without ulcers or rashes  Vascular lab study today revealed no significant narrowing in the right carotid. There was some elevated velocities which was mild to moderate on the left internal carotid artery appears to be related tortuosity with no significant plaque and no critical narrowing.  Impression and plan mild to moderate left carotid stenosis with elevated velocities and she related to tortuosity of the vessel. I discussed this at length with the patient. I have recommended a yearly carotid duplex to rule out any progression of her disease. She knows to notify us immediately should she develop any focal neurologic deficits.

## 2013-05-07 ENCOUNTER — Ambulatory Visit (INDEPENDENT_AMBULATORY_CARE_PROVIDER_SITE_OTHER): Payer: Medicare Other | Admitting: Cardiovascular Disease

## 2013-05-07 ENCOUNTER — Encounter: Payer: Self-pay | Admitting: Cardiovascular Disease

## 2013-05-07 VITALS — BP 112/58 | HR 80 | Ht 65.0 in | Wt 191.8 lb

## 2013-05-07 DIAGNOSIS — I2510 Atherosclerotic heart disease of native coronary artery without angina pectoris: Secondary | ICD-10-CM

## 2013-05-07 NOTE — Patient Instructions (Signed)
Your physician wants you to follow-up in: 6 MONTHS with Dr Cooper.  You will receive a reminder letter in the mail two months in advance. If you don't receive a letter, please call our office to schedule the follow-up appointment.  Your physician recommends that you continue on your current medications as directed. Please refer to the Current Medication list given to you today.  

## 2013-05-07 NOTE — Progress Notes (Signed)
HPI:  72 year old woman presenting for followup evaluation. The patient has been previously followed by Dr. Riley Kill and I will be assuming her cardiac care in his retirement. She has been followed for coronary artery disease. She underwent heart catheterization in 2006 demonstrating mild nonobstructive CAD with 20-30% stenosis of the left main and LAD. There is no other significant disease noted. Left ventricular function was normal. She has carotid stenosis which has been graded at mild to moderate and she is followed by Dr. [name]. Her last myocardial perfusion scan was in November 2013 and this demonstrated no definite ischemia. The gated left ventricular ejection fraction was 80%. Lipids from April 2014 showed a cholesterol of 123, trig illustrates to 55, HDL 41, LDL 46 the LFTs were within limits.  Patient had a few episodes of chest discomfort but no change in overall pattern. She's been doing well. She's had some allergy symptoms. She complains of occasional leg swelling. Overall there are really no changes in any of her symptoms. She was having some problems with lightheadedness and dizziness and her beta blocker and ACE inhibitor were reduced. This has helped. She is tolerating her current medicines without problems.  Outpatient Encounter Prescriptions as of 05/07/2013  Medication Sig Dispense Refill  . ACIPHEX 20 MG tablet TAKE 1 TABLET EVERY DAY  30 tablet  3  . ALPRAZolam (XANAX) 0.5 MG tablet Take 0.5 mg by mouth at bedtime.      Marland Kitchen anastrozole (ARIMIDEX) 1 MG tablet Take 1 mg by mouth daily.      Marland Kitchen aspirin 325 MG tablet Take 325 mg by mouth daily.      Marland Kitchen atorvastatin (LIPITOR) 20 MG tablet Take 1 tablet (20 mg total) by mouth daily.  90 tablet  3  . calcium-vitamin D (OSCAL WITH D) 500-200 MG-UNIT per tablet Take 1 tablet by mouth 2 (two) times daily.      . Coenzyme Q10 (CO Q-10) 100 MG CAPS Take 100 mg by mouth daily.      . cyanocobalamin (,VITAMIN B-12,) 1000 MCG/ML injection Inject 1  mL (1,000 mcg total) into the muscle every 30 (thirty) days.  10 mL  2  . diclofenac (VOLTAREN) 75 MG EC tablet Take by mouth 2 (two) times daily.       . DULoxetine (CYMBALTA) 60 MG capsule Take 60 mg by mouth daily.      . fexofenadine (ALLEGRA) 30 MG tablet Take 30 mg by mouth daily.      . fluticasone (FLONASE) 50 MCG/ACT nasal spray Place 2 sprays into the nose daily.      Marland Kitchen glyBURIDE-metformin (GLUCOVANCE) 5-500 MG per tablet Take 2 tablets by mouth 2 (two) times daily with a meal.      . insulin detemir (LEVEMIR) 100 UNIT/ML injection Inject 25 Units into the skin at bedtime. Sliding scale      . insulin glulisine (APIDRA) 100 UNIT/ML injection Inject 8 Units into the skin as needed.       Marland Kitchen levothyroxine (SYNTHROID, LEVOTHROID) 112 MCG tablet Take 112 mcg by mouth daily.      . metoprolol succinate (TOPROL-XL) 25 MG 24 hr tablet Take 25 mg by mouth daily.      . montelukast (SINGULAIR) 10 MG tablet Take 10 mg by mouth at bedtime.      Marland Kitchen omeprazole (PRILOSEC) 20 MG capsule Take 20 mg by mouth 2 (two) times daily.       . ramipril (ALTACE) 5 MG capsule Take 1 capsule (5  mg total) by mouth daily.  90 capsule  3  . triamterene-hydrochlorothiazide (MAXZIDE-25) 37.5-25 MG per tablet Take 1 tablet by mouth daily.       . [DISCONTINUED] Besifloxacin HCl (BESIVANCE) 0.6 % SUSP Apply 1 drop to eye daily.      . [DISCONTINUED] Bromfenac Sodium (PROLENSA) 0.07 % SOLN Apply 1 drop to eye 3 (three) times daily.      . [DISCONTINUED] Difluprednate (DUREZOL) 0.05 % EMUL Apply 1 drop to eye 3 (three) times daily.      . [DISCONTINUED] RESTASIS 0.05 % ophthalmic emulsion        No facility-administered encounter medications on file as of 05/07/2013.    Allergies  Allergen Reactions  . Codeine Hives  . Sulfonamide Derivatives Hives    Past Medical History  Diagnosis Date  . Esophageal reflux   . Hiatal hernia   . Unspecified gastritis and gastroduodenitis without mention of hemorrhage   .  Esophageal stricture   . Type II or unspecified type diabetes mellitus without mention of complication, not stated as uncontrolled   . Fibromyalgia   . Hypertension   . Anxiety   . Depression   . Sleep apnea     CPAP  . Kidney stones   . Hypercholesterolemia   . Thyroid disease   . IBS (irritable bowel syndrome)   . Breast cancer   . Cataract   . Cataract   . Anemia   . Coronary heart disease   . Pneumonia     recently, just finished ABO  . Carotid artery plaque     bilat  . Anginal pain   . Hypothyroidism   . Arthritis   . Dizziness   . PONV (postoperative nausea and vomiting)     ROS: Negative except as per HPI  BP 112/58  Pulse 80  Ht 5\' 5"  (1.651 m)  Wt 87 kg (191 lb 12.8 oz)  BMI 31.92 kg/m2  SpO2 96%  PHYSICAL EXAM: Pt is alert and oriented, NAD HEENT: normal Neck: JVP - normal, carotids 2+= without bruits Lungs: CTA bilaterally CV: RRR without murmur or gallop Abd: soft, NT, Positive BS, no hepatomegaly Ext: no C/C/E, distal pulses intact and equal Skin: warm/dry no rash  Myoview November 2013: QPS  Raw Data Images: Patient motion noted; appropriate software correction applied.  Stress Images: There is mild decreased activity in a small area at the apical cap. The degree of photon reduction is mild.  Rest Images: Uptake is normal in all segments.  Subtraction (SDS): No evidence of ischemia.  Transient Ischemic Dilatation (Normal <1.22): 0.98  Lung/Heart Ratio (Normal <0.45): 0.28  Quantitative Gated Spect Images  QGS EDV: 68 ml  QGS ESV: 14 ml  Impression  Exercise Capacity: Lexiscan with low level exercise.  BP Response: Normal blood pressure response.  Clinical Symptoms: dyspnea ,Also there was mild chest tightness at peak exercise  ECG Impression: No significant ST segment change suggestive of ischemia.  Comparison with Prior Nuclear Study: No previous nuclear study performed  Overall Impression: Normal stress nuclear study. With stress there  was very slight decreased activity at the apical cap. I believe that the difference between the stress and rest images is related to shifting breast attenuation. There is no definite ischemia. This is a LOW RISK scan.  LV Ejection Fraction: 80%. LV Wall Motion: Normal Wall Motion.   ASSESSMENT AND PLAN: #1. Coronary artery disease, nonobstructive. The patient is stable with atypical angina. She's on a good medical program.  She had a nuclear stress scan within the past 12 months. This showed no ischemia. I recommended continuing her current medications. She should increase exercise and continue to work on diet.  #2. Hypertension. Medications reviewed. Blood pressure is under excellent control. No changes were made today.  #3. Hyperlipidemia. Lipids are at goal on atorvastatin. Will continue with regular followup at one year intervals.  #4. Mild-to-moderate asymptomatic carotid stenosis. Continue followup with Dr. [name].  For follow-up I'll see her back in 6 months.  Tonny Bollman 05/07/2013 3:26 PM

## 2013-07-02 DIAGNOSIS — C50919 Malignant neoplasm of unspecified site of unspecified female breast: Secondary | ICD-10-CM | POA: Insufficient documentation

## 2013-11-06 ENCOUNTER — Encounter: Payer: Self-pay | Admitting: Cardiovascular Disease

## 2013-11-06 ENCOUNTER — Encounter (INDEPENDENT_AMBULATORY_CARE_PROVIDER_SITE_OTHER): Payer: Self-pay

## 2013-11-06 ENCOUNTER — Ambulatory Visit (INDEPENDENT_AMBULATORY_CARE_PROVIDER_SITE_OTHER): Payer: Medicare Other | Admitting: Cardiovascular Disease

## 2013-11-06 VITALS — BP 120/66 | HR 74 | Ht 65.0 in | Wt 203.0 lb

## 2013-11-06 DIAGNOSIS — I6529 Occlusion and stenosis of unspecified carotid artery: Secondary | ICD-10-CM

## 2013-11-06 DIAGNOSIS — R0602 Shortness of breath: Secondary | ICD-10-CM

## 2013-11-06 LAB — BASIC METABOLIC PANEL
BUN: 15 mg/dL (ref 6–23)
CHLORIDE: 101 meq/L (ref 96–112)
CO2: 30 mEq/L (ref 19–32)
CREATININE: 1.2 mg/dL (ref 0.4–1.2)
Calcium: 9 mg/dL (ref 8.4–10.5)
GFR: 48.52 mL/min — AB (ref >60.00)
Glucose, Bld: 147 mg/dL — ABNORMAL HIGH (ref 70–99)
POTASSIUM: 4.1 meq/L (ref 3.5–5.1)
Sodium: 137 mEq/L (ref 135–145)

## 2013-11-06 LAB — BRAIN NATRIURETIC PEPTIDE: PRO B NATRI PEPTIDE: 28 pg/mL (ref 0.0–100.0)

## 2013-11-06 MED ORDER — FUROSEMIDE 40 MG PO TABS: ORAL_TABLET | ORAL | Status: DC

## 2013-11-06 MED ORDER — POTASSIUM CHLORIDE ER 10 MEQ PO TBCR: EXTENDED_RELEASE_TABLET | ORAL | Status: DC

## 2013-11-06 NOTE — Patient Instructions (Addendum)
Your physician recommends that you have lab work today:  BNP, BMET  Your physician has requested that you have an echocardiogram. Echocardiography is a painless test that uses sound waves to create images of your heart. It provides your doctor with information about the size and shape of your heart and how well your heart's chambers and valves are working. This procedure takes approximately one hour. There are no restrictions for this procedure.  Your physician has recommended you make the following change in your medication:  START Lasix 40 mg daily for 3 days then as needed START Potassium 10 mEq with the Lasix - do not take this medication unless you take Lasix   Your physician wants you to follow-up in: 6 months with Dr. Burt Knack.  You will receive a reminder letter in the mail two months in advance. If you don't receive a letter, please call our office to schedule the follow-up appointment.  Your physician recommends that you wear thigh high compression stockings on your legs when you are ambulatory during the day.  Elevate your legs as often as possible and limit your salt intake.

## 2013-11-06 NOTE — Progress Notes (Signed)
HPI:  73 year old woman presenting for followup evaluation.  She has been followed for coronary artery disease. She underwent heart catheterization in 2006 demonstrating mild nonobstructive CAD with 20-30% stenosis of the left main and LAD. There is no other significant disease noted. Left ventricular function was normal. She has carotid stenosis which has been graded at mild to moderate and she is followed by Dr. Donnetta Hutching. Her last myocardial perfusion scan was in November 2013 and this demonstrated no definite ischemia. The gated left ventricular ejection fraction was 80%. Lipids from April 2014 showed a cholesterol of 123, trig illustrates to 55, HDL 41, LDL 46 the LFTs were within limits.  The patient has experienced problems with leg swelling. She's noted a lot of edema in both sides. Her swelling used to resolve first thing in the morning but it has been persistent. She has been unable to get her boots on. She has shortness of breath with exertion but no marked change recently. She denies orthopnea, PND, or recent chest pain. She tries to avoid salt and does not add any salt to her food.  Lipid Panel     Component Value Date/Time   CHOL 123 01/20/2013 1626   TRIG 255.0* 01/20/2013 1626   HDL 41.10 01/20/2013 1626   CHOLHDL 3 01/20/2013 1626   VLDL 51.0* 01/20/2013 1626   LDLCALC 41 12/28/2008 1140    Outpatient Encounter Prescriptions as of 11/06/2013  Medication Sig  . ALPRAZolam (XANAX) 0.5 MG tablet Take 0.5 mg by mouth at bedtime.  Marland Kitchen anastrozole (ARIMIDEX) 1 MG tablet Take 1 mg by mouth daily.  Marland Kitchen aspirin 325 MG tablet Take 325 mg by mouth daily.  Marland Kitchen atorvastatin (LIPITOR) 20 MG tablet Take 1 tablet (20 mg total) by mouth daily.  . calcium-vitamin D (OSCAL WITH D) 500-200 MG-UNIT per tablet Take 1 tablet by mouth 2 (two) times daily.  . Coenzyme Q10 (CO Q-10) 100 MG CAPS Take 100 mg by mouth daily.  . cyanocobalamin (,VITAMIN B-12,) 1000 MCG/ML injection Inject 1 mL (1,000 mcg total) into  the muscle every 30 (thirty) days.  . diclofenac (VOLTAREN) 75 MG EC tablet Take by mouth 2 (two) times daily.   . DULoxetine (CYMBALTA) 60 MG capsule Take 60 mg by mouth daily.  . fexofenadine (ALLEGRA) 30 MG tablet Take 30 mg by mouth daily.  . fluticasone (FLONASE) 50 MCG/ACT nasal spray Place 2 sprays into the nose daily.  Marland Kitchen glyBURIDE-metformin (GLUCOVANCE) 5-500 MG per tablet Take 2 tablets by mouth 2 (two) times daily with a meal.  . Insulin Lispro Prot & Lispro (HUMALOG MIX 75/25 KWIKPEN) (75-25) 100 UNIT/ML Kwikpen Inject 25 Units into the skin daily. 25 units in the morning and 20 units nightly  . levothyroxine (SYNTHROID, LEVOTHROID) 112 MCG tablet Take 112 mcg by mouth daily.  . metoprolol succinate (TOPROL-XL) 25 MG 24 hr tablet Take 25 mg by mouth daily.  . montelukast (SINGULAIR) 10 MG tablet Take 10 mg by mouth at bedtime.  Marland Kitchen omeprazole (PRILOSEC) 20 MG capsule Take 20 mg by mouth 2 (two) times daily.   . ramipril (ALTACE) 5 MG capsule Take 1 capsule (5 mg total) by mouth daily.  Marland Kitchen triamterene-hydrochlorothiazide (MAXZIDE-25) 37.5-25 MG per tablet Take 1 tablet by mouth daily.   . [DISCONTINUED] ACIPHEX 20 MG tablet TAKE 1 TABLET EVERY DAY  . [DISCONTINUED] insulin detemir (LEVEMIR) 100 UNIT/ML injection Inject 25 Units into the skin at bedtime. Sliding scale  . [DISCONTINUED] insulin glulisine (APIDRA) 100 UNIT/ML injection Inject  8 Units into the skin as needed.     Allergies  Allergen Reactions  . Codeine Hives  . Sulfonamide Derivatives Hives    Past Medical History  Diagnosis Date  . Esophageal reflux   . Hiatal hernia   . Unspecified gastritis and gastroduodenitis without mention of hemorrhage   . Esophageal stricture   . Type II or unspecified type diabetes mellitus without mention of complication, not stated as uncontrolled   . Fibromyalgia   . Hypertension   . Anxiety   . Depression   . Sleep apnea     CPAP  . Kidney stones   . Hypercholesterolemia   .  Thyroid disease   . IBS (irritable bowel syndrome)   . Breast cancer   . Cataract   . Cataract   . Anemia   . Coronary heart disease   . Pneumonia     recently, just finished ABO  . Carotid artery plaque     bilat  . Anginal pain   . Hypothyroidism   . Arthritis   . Dizziness   . PONV (postoperative nausea and vomiting)     ROS: Negative except as per HPI  BP 120/66  Pulse 74  Ht 5\' 5"  (1.651 m)  Wt 203 lb (92.08 kg)  BMI 33.78 kg/m2  PHYSICAL EXAM: Pt is alert and oriented, pleasant overweight woman in NAD HEENT: normal Neck: JVP - normal, carotids 2+= with bruits Lungs: CTA bilaterally CV: RRR without murmur or gallop Abd: soft, NT, Positive BS, no hepatomegaly Ext: 2+ pretibial edema bilaterally, distal pulses intact and equal Skin: warm/dry no rash  Myoview November 2013: QPS  Raw Data Images: Patient motion noted; appropriate software correction applied.  Stress Images: There is mild decreased activity in a small area at the apical cap. The degree of photon reduction is mild.  Rest Images: Uptake is normal in all segments.  Subtraction (SDS): No evidence of ischemia.  Transient Ischemic Dilatation (Normal <1.22): 0.98  Lung/Heart Ratio (Normal <0.45): 0.28  Quantitative Gated Spect Images  QGS EDV: 68 ml  QGS ESV: 14 ml  Impression  Exercise Capacity: Lexiscan with low level exercise.  BP Response: Normal blood pressure response.  Clinical Symptoms: dyspnea ,Also there was mild chest tightness at peak exercise  ECG Impression: No significant ST segment change suggestive of ischemia.  Comparison with Prior Nuclear Study: No previous nuclear study performed  Overall Impression: Normal stress nuclear study. With stress there was very slight decreased activity at the apical cap. I believe that the difference between the stress and rest images is related to shifting breast attenuation. There is no definite ischemia. This is a LOW RISK scan.  LV Ejection Fraction:  80%. LV Wall Motion: Normal Wall Motion.  EKG: Normal sinus rhythm 74 beats per minute, possible inferior infarct age undetermined.  ASSESSMENT AND PLAN: #1. Coronary artery disease, nonobstructive. No symptoms of angina. Myoview last year without significant ischemia. Remains stable at this point.  #2. Hypertension. Medications reviewed. Blood pressure is under excellent control.   #3. Hyperlipidemia. Lipids are at goal on atorvastatin. Will continue with regular followup at one year intervals.  #4. Mild-to-moderate asymptomatic carotid stenosis. Continue followup with Dr. Donnetta Hutching.   #5. Leg swelling. Significant changes on exam. Will check an echocardiogram to evaluate for LV systolic or diastolic dysfunction. Also we'll check a metabolic panel and BNP. If these all check out okay, this may be related to venous insufficiency. We prescribed 20-30 mm mercury pressure compression stockings  and recommended leg elevation and salt avoidance. Also will give furosemide 40 mg for 3 days then as needed. She will supplement potassium when she takes Lasix.   For follow-up I'll see her back in 6 months.  Sherren Mocha 11/06/2013 11:23 AM

## 2013-11-19 ENCOUNTER — Telehealth: Payer: Self-pay | Admitting: Cardiovascular Disease

## 2013-11-19 NOTE — Telephone Encounter (Deleted)
RROR

## 2013-11-20 ENCOUNTER — Telehealth: Payer: Self-pay

## 2013-11-20 NOTE — Telephone Encounter (Signed)
Rec'd phone call from pt. 2/12; ret'd call @ 5:30 PM 2/12.  Reported having episodes of "passing-out 3-4 times over past few weeks."  Reported that the episodes have occurred in early morning, when she 1st awakens, when she hadn't had enough sleep.  Stated "when I first get up, to go to the BR, I go out."  Reported she was admitted to [facility], after the last event on 2/2.  Stated she had a CT scan of her head, and a chest xray.  Stated she was diagnosed with "Syncope, and a UTI."   Has plans to travel to [place], [place] on 2/15, and questions if she should be evaluated further, prior to the trip.  Stated "my kids won't let me drive."  Questioned what was recommended for further care when she was discharged from [facility]?  Stated she wasn't given any further information, except the diagnosis of syncope and UTI.  Advised to call her PCP for further recommendations, before traveling.  Offered that her Carotid ultrasound and appt. with Dr. Donnetta Hutching could be moved to an earlier date, (sched for f/u 04/2014) but would not be able to evaluate her on Friday, 2/13, prior to her trip on 2/15.  Pt. verb. understanding.  Stated she will call Dr. Manuella Ghazi, her PCP, for recommendations about traveling, and would call our office back if needs earlier appt.

## 2013-11-24 ENCOUNTER — Other Ambulatory Visit: Payer: Self-pay | Admitting: Vascular Surgery

## 2013-11-24 DIAGNOSIS — I6529 Occlusion and stenosis of unspecified carotid artery: Secondary | ICD-10-CM

## 2013-12-01 ENCOUNTER — Other Ambulatory Visit (HOSPITAL_COMMUNITY): Payer: Medicare Other

## 2013-12-24 ENCOUNTER — Ambulatory Visit (HOSPITAL_COMMUNITY): Payer: Medicare Other | Attending: Cardiology | Admitting: Radiology

## 2013-12-24 ENCOUNTER — Encounter: Payer: Self-pay | Admitting: Cardiology

## 2013-12-24 DIAGNOSIS — E119 Type 2 diabetes mellitus without complications: Secondary | ICD-10-CM | POA: Insufficient documentation

## 2013-12-24 DIAGNOSIS — R0602 Shortness of breath: Secondary | ICD-10-CM

## 2013-12-24 DIAGNOSIS — E669 Obesity, unspecified: Secondary | ICD-10-CM | POA: Insufficient documentation

## 2013-12-24 DIAGNOSIS — I10 Essential (primary) hypertension: Secondary | ICD-10-CM | POA: Insufficient documentation

## 2013-12-24 DIAGNOSIS — I2510 Atherosclerotic heart disease of native coronary artery without angina pectoris: Secondary | ICD-10-CM

## 2013-12-24 DIAGNOSIS — I359 Nonrheumatic aortic valve disorder, unspecified: Secondary | ICD-10-CM | POA: Insufficient documentation

## 2013-12-24 DIAGNOSIS — E785 Hyperlipidemia, unspecified: Secondary | ICD-10-CM | POA: Insufficient documentation

## 2013-12-24 DIAGNOSIS — R55 Syncope and collapse: Secondary | ICD-10-CM

## 2013-12-24 NOTE — Progress Notes (Signed)
Echocardiogram Performed. 

## 2013-12-30 ENCOUNTER — Other Ambulatory Visit: Payer: Self-pay | Admitting: Cardiology

## 2014-02-04 NOTE — Telephone Encounter (Signed)
Close  

## 2014-04-28 ENCOUNTER — Encounter: Payer: Self-pay | Admitting: Family

## 2014-04-29 ENCOUNTER — Encounter: Payer: Self-pay | Admitting: Family

## 2014-04-29 ENCOUNTER — Ambulatory Visit (INDEPENDENT_AMBULATORY_CARE_PROVIDER_SITE_OTHER): Payer: Medicare Other | Admitting: Family

## 2014-04-29 ENCOUNTER — Ambulatory Visit (HOSPITAL_COMMUNITY)
Admission: RE | Admit: 2014-04-29 | Discharge: 2014-04-29 | Disposition: A | Discharge status: Home / Self Care | Payer: Medicare Other | Source: Ambulatory Visit | Attending: Family | Admitting: Family

## 2014-04-29 ENCOUNTER — Other Ambulatory Visit (HOSPITAL_COMMUNITY): Payer: Medicare Other

## 2014-04-29 ENCOUNTER — Ambulatory Visit: Payer: Medicare Other | Admitting: Family

## 2014-04-29 VITALS — BP 113/71 | HR 69 | Resp 16 | Ht 64.5 in | Wt 189.0 lb

## 2014-04-29 DIAGNOSIS — I6529 Occlusion and stenosis of unspecified carotid artery: Secondary | ICD-10-CM

## 2014-04-29 DIAGNOSIS — M7989 Other specified soft tissue disorders: Secondary | ICD-10-CM | POA: Insufficient documentation

## 2014-04-29 DIAGNOSIS — R42 Dizziness and giddiness: Secondary | ICD-10-CM | POA: Insufficient documentation

## 2014-04-29 DIAGNOSIS — R29898 Other symptoms and signs involving the musculoskeletal system: Secondary | ICD-10-CM | POA: Insufficient documentation

## 2014-04-29 NOTE — Addendum Note (Signed)
Addended by: Mena Goes on: 04/29/2014 01:45 PM   Modules accepted: Orders

## 2014-04-29 NOTE — Patient Instructions (Signed)
Stroke Prevention Some medical conditions and behaviors are associated with an increased chance of having a stroke. You may prevent a stroke by making healthy choices and managing medical conditions. HOW CAN I REDUCE MY RISK OF HAVING A STROKE?   Stay physically active. Get at least 30 minutes of activity on most or all days.  Do not smoke. It may also be helpful to avoid exposure to secondhand smoke.  Limit alcohol use. Moderate alcohol use is considered to be:  No more than 2 drinks per day for men.  No more than 1 drink per day for nonpregnant women.  Eat healthy foods. This involves:  Eating 5 or more servings of fruits and vegetables a day.  Making dietary changes that address high blood pressure (hypertension), high cholesterol, diabetes, or obesity.  Manage your cholesterol levels.  Making food choices that are high in fiber and low in saturated fat, trans fat, and cholesterol may control cholesterol levels.  Take any prescribed medicines to control cholesterol as directed by your health care provider.  Manage your diabetes.  Controlling your carbohydrate and sugar intake is recommended to manage diabetes.  Take any prescribed medicines to control diabetes as directed by your health care provider.  Control your hypertension.  Making food choices that are low in salt (sodium), saturated fat, trans fat, and cholesterol is recommended to manage hypertension.  Take any prescribed medicines to control hypertension as directed by your health care provider.  Maintain a healthy weight.  Reducing calorie intake and making food choices that are low in sodium, saturated fat, trans fat, and cholesterol are recommended to manage weight.  Stop drug abuse.  Avoid taking birth control pills.  Talk to your health care provider about the risks of taking birth control pills if you are over 35 years old, smoke, get migraines, or have ever had a blood clot.  Get evaluated for sleep  disorders (sleep apnea).  Talk to your health care provider about getting a sleep evaluation if you snore a lot or have excessive sleepiness.  Take medicines only as directed by your health care provider.  For some people, aspirin or blood thinners (anticoagulants) are helpful in reducing the risk of forming abnormal blood clots that can lead to stroke. If you have the irregular heart rhythm of atrial fibrillation, you should be on a blood thinner unless there is a good reason you cannot take them.  Understand all your medicine instructions.  Make sure that other conditions (such as anemia or atherosclerosis) are addressed. SEEK IMMEDIATE MEDICAL CARE IF:   You have sudden weakness or numbness of the face, arm, or leg, especially on one side of the body.  Your face or eyelid droops to one side.  You have sudden confusion.  You have trouble speaking (aphasia) or understanding.  You have sudden trouble seeing in one or both eyes.  You have sudden trouble walking.  You have dizziness.  You have a loss of balance or coordination.  You have a sudden, severe headache with no known cause.  You have new chest pain or an irregular heartbeat. Any of these symptoms may represent a serious problem that is an emergency. Do not wait to see if the symptoms will go away. Get medical help at once. Call your local emergency services (911 in [place] Do not drive yourself to the hospital. Document Released: 11/01/2004 Document Revised: 02/08/2014 Document Reviewed: 03/27/2013 Exit[employer] Patient Information 2015 [employer]. This information is not intended to replace advice given   to you by your health care provider. Make sure you discuss any questions you have with your health care provider.  

## 2014-04-29 NOTE — Progress Notes (Signed)
Established Carotid Patient   History of Present Illness  Jeanne Kim is a 73 y.o. female patient of Dr. Donnetta Hutching who returns for follow up of her mild to moderate left carotid stenosis with elevated velocities related to tortuosity of the vessel.   Patient has not had previous carotid artery intervention.  Patient has Negative history of TIA or stroke symptom.  The patient denies amaurosis fugax or monocular blindness.  The patient  denies facial drooping.  Pt. denies hemiplegia.  The patient denies receptive or expressive aphasia.  Pt. denies extremity weakness.  Pt states she has plaque and kinking in her coronary arteries, she is unsure if she has had an MI.   Pt reports New Medical or Surgical History: c-spine issues, seeing ortho for this, has intermittent tingling, numbness, weakness in her left arm. She also has known lumbar disc disease which affects her left leg more so than right. She has fibromyalgia, denies non healing wounds. She has trouble with her balance lately.   Pt Diabetic: Yes, uncontrolled lately, she sees an endocrinologist at [facility] in [facility] Pt smoker: non-smoker  Pt meds include: Statin : Yes ASA: Yes Other anticoagulants/antiplatelets: no   Past Medical History  Diagnosis Date  . Esophageal reflux   . Hiatal hernia   . Unspecified gastritis and gastroduodenitis without mention of hemorrhage   . Esophageal stricture   . Type II or unspecified type diabetes mellitus without mention of complication, not stated as uncontrolled   . Fibromyalgia   . Hypertension   . Anxiety   . Depression   . Sleep apnea     CPAP  . Kidney stones   . Hypercholesterolemia   . Thyroid disease   . IBS (irritable bowel syndrome)   . Breast cancer   . Cataract   . Cataract   . Anemia   . Coronary heart disease   . Pneumonia     recently, just finished ABO  . Carotid artery plaque     bilat  . Anginal pain   . Hypothyroidism   . Arthritis   . Dizziness    . PONV (postoperative nausea and vomiting)     Social History History  Substance Use Topics  . Smoking status: Never Smoker   . Smokeless tobacco: Never Used  . Alcohol Use: No    Family History Family History  Problem Relation Age of Onset  . Esophageal cancer Maternal Uncle   . Ovarian cancer Maternal Aunt   . Colon cancer Maternal Uncle   . Lung cancer Maternal Uncle   . Sarcoidosis Mother   . Lung cancer Father   . Diabetes Mother   . Diabetes Father   . Diabetes Maternal Uncle   . Diabetes Maternal Aunt   . Colon polyps Maternal Uncle   . Heart disease Father   . Heart disease Paternal Grandmother   . Urolithiasis Father     Surgical History Past Surgical History  Procedure Laterality Date  . Cardiac catheterization    . Total abdominal hysterectomy    . Appendectomy    . Cystocele repair    . Rectocele repair    . Thyroidectomy    . Breast lumpectomy      left  . Cervical fusion    . Cholecystectomy    . Tonsillectomy    . Cesarean section    . Cataract extraction w/phaco Left 03/30/2013    Procedure: CATARACT EXTRACTION PHACO AND INTRAOCULAR LENS PLACEMENT (IOC);  Surgeon: Jeanne Branch,  Jeanne Kim;  Location: [facility];  Service: Ophthalmology;  Laterality: Left;  CDE:19.28  . Cataract extraction w/phaco Right 04/09/2013    Procedure: CATARACT EXTRACTION PHACO AND INTRAOCULAR LENS PLACEMENT (IOC);  Surgeon: Jeanne Branch, Jeanne Kim;  Location: [facility];  Service: Ophthalmology;  Laterality: Right;  CDE: 15.44    Allergies  Allergen Reactions  . Codeine Hives  . Sulfonamide Derivatives Hives    Current Outpatient Prescriptions  Medication Sig Dispense Refill  . ALPRAZolam (XANAX) 0.5 MG tablet Take 0.5 mg by mouth at bedtime.      Marland Kitchen anastrozole (ARIMIDEX) 1 MG tablet Take 1 mg by mouth daily.      Marland Kitchen aspirin 325 MG tablet Take 325 mg by mouth daily.      Marland Kitchen atorvastatin (LIPITOR) 20 MG tablet Take 1 tablet (20 mg total) by mouth daily.  90 tablet  3  . calcium-vitamin D  (OSCAL WITH D) 500-200 MG-UNIT per tablet Take 1 tablet by mouth 2 (two) times daily.      . Coenzyme Q10 (CO Q-10) 100 MG CAPS Take 100 mg by mouth daily.      . cyanocobalamin (,VITAMIN B-12,) 1000 MCG/ML injection Inject 1 mL (1,000 mcg total) into the muscle every 30 (thirty) days.  10 mL  2  . diclofenac (VOLTAREN) 75 MG EC tablet Take by mouth 2 (two) times daily.       . DULoxetine (CYMBALTA) 60 MG capsule Take 60 mg by mouth daily.      . fexofenadine (ALLEGRA) 30 MG tablet Take 30 mg by mouth daily.      . fluticasone (FLONASE) 50 MCG/ACT nasal spray Place 2 sprays into the nose daily.      . furosemide (LASIX) 40 MG tablet Take 1 pill daily for 3 days then as needed for swelling  30 tablet  6  . glyBURIDE-metformin (GLUCOVANCE) 5-500 MG per tablet Take 2 tablets by mouth 2 (two) times daily with a meal.      . Insulin Lispro Prot & Lispro (HUMALOG MIX 75/25 KWIKPEN) (75-25) 100 UNIT/ML Kwikpen Inject 25 Units into the skin daily. 25 units in the morning and 20 units nightly      . levothyroxine (SYNTHROID, LEVOTHROID) 112 MCG tablet Take 112 mcg by mouth daily.      . metoprolol succinate (TOPROL-XL) 25 MG 24 hr tablet Take 25 mg by mouth daily.      . metoprolol succinate (TOPROL-XL) 25 MG 24 hr tablet Take 1 tablet (25 mg total) by mouth at bedtime. Take  with or immediately  following a meal.  90 tablet  1  . montelukast (SINGULAIR) 10 MG tablet Take 10 mg by mouth at bedtime.      Marland Kitchen omeprazole (PRILOSEC) 20 MG capsule Take 20 mg by mouth 2 (two) times daily.       . potassium chloride (K-DUR) 10 MEQ tablet Take 1 pill every time you take Lasix  30 tablet  6  . ramipril (ALTACE) 5 MG capsule Take 1 capsule (5 mg total) by mouth daily.  90 capsule  3  . triamterene-hydrochlorothiazide (MAXZIDE-25) 37.5-25 MG per tablet Take 1 tablet by mouth daily.        No current facility-administered medications for this visit.    Review of Systems : See HPI for pertinent positives and  negatives.  Physical Examination  Filed Vitals:   04/29/14 1141  BP: 113/71  Pulse: 69  Resp: 16  Height: 5' 4.5" (1.638 m)  Weight: 189 lb (  85.73 kg)  SpO2: 95%   Body mass index is 31.95 kg/(m^2).  General: WDWN obese female in NAD GAIT: slow, deliberate Eyes: PERRLA Pulmonary:  Non-labored, CTAB, Negative  Rales, Negative rhonchi, & Negative wheezing.  Cardiac: regular Rhythm ,  Negative detected murmur.  VASCULAR EXAM Carotid Bruits Left Right   Negative Negative     Radial pulses are 2+ palpable and equal.                                                                                                                            LE Pulses LEFT RIGHT       POPLITEAL  not palpable   not palpable       POSTERIOR TIBIAL   palpable    palpable        DORSALIS PEDIS      ANTERIOR TIBIAL  palpable   palpable     Gastrointestinal: soft, nontender, BS WNL, no r/g,  negative masses.  Musculoskeletal: Negative muscle atrophy/wasting. M/S 5/5 in upper extremities, 4/5 in lower extremities, Extremities without ischemic changes.  Neurologic: A&O X 3; Appropriate Affect ; SENSATION equal and symmetric except slightly diminished in left fingers and toes compared to right;  Speech is normal CN 2-12 intact, Motor exam as listed above.   Non-Invasive Vascular Imaging CAROTID DUPLEX 04/29/2014   CEREBROVASCULAR DUPLEX EVALUATION    INDICATION: Carotid disease    PREVIOUS INTERVENTION(S):     DUPLEX EXAM:     RIGHT  LEFT  Peak Systolic Velocities (cm/s) End Diastolic Velocities (cm/s) Plaque LOCATION Peak Systolic Velocities (cm/s) End Diastolic Velocities (cm/s) Plaque  91 9  CCA PROXIMAL 68 11   54 13  CCA MID 66 14   55 14 HT CCA DISTAL 59 15   68 9  ECA 72 9   47 15  ICA PROXIMAL 59 14   76 23  ICA MID 132 32   58 17  ICA DISTAL 81 28     0.9 ICA / CCA Ratio (PSV) 2.2  Antegrade Vertebral Flow Antegrade   Brachial Systolic Pressure (mmHg)   Multiphasic  (subclavian artery) Brachial Artery Waveforms Multiphasic (subclavian artery)    Plaque Morphology:  HM = Homogeneous, HT = Heterogeneous, CP = Calcific Plaque, SP = Smooth Plaque, IP = Irregular Plaque     ADDITIONAL FINDINGS:   Brachial pressures were not obtained due to patient's history of breast cancer.   Minimal plaque formation noted in the right distal common carotid artery.   Vessel kink/tortuosity noted in the left mid internal carotid artery resulting in a mildly elevated velocity.    IMPRESSION: No hemodynamically significant stenoses of the bilateral internal carotid arteries with vessel tortuosity, as described above.     Compared to the previous exam:  No significant change noted when compared to the previous exam on 04/28/13.        Assessment: MORIYAH BYINGTON is a 73 y.o. female who has mild  to moderate left carotid stenosis with elevated velocities related to tortuosity of the vessel.  Patient has not had previous carotid artery intervention. Carotid Duplex today indicates no hemodynamically significant stenoses of the bilateral internal carotid arteries with vessel tortuosity, as described above.  No significant change noted when compared to the previous exam on 04/28/13.   Plan: Follow-up in 1 year with Carotid Duplex scan.   I discussed in depth with the patient the nature of atherosclerosis, and emphasized the importance of maximal medical management including strict control of blood pressure, blood glucose, and lipid levels, obtaining regular exercise, and continued cessation of smoking.  The patient is aware that without maximal medical management the underlying atherosclerotic disease process will progress, limiting the benefit of any interventions. The patient was given information about stroke prevention and what symptoms should prompt the patient to seek immediate medical care. Thank you for allowing [place] to participate in this patient's care.  Clemon Chambers, RN,  MSN, FNP-C [facility] Office: [facility] Physician: Oneida Alar  04/29/2014 10:08 AM

## 2014-05-05 ENCOUNTER — Other Ambulatory Visit: Payer: Self-pay | Admitting: Orthopedic Surgery

## 2014-05-05 DIAGNOSIS — M542 Cervicalgia: Secondary | ICD-10-CM

## 2014-05-06 ENCOUNTER — Ambulatory Visit (INDEPENDENT_AMBULATORY_CARE_PROVIDER_SITE_OTHER): Payer: Medicare Other | Admitting: Cardiovascular Disease

## 2014-05-06 ENCOUNTER — Ambulatory Visit
Admission: RE | Admit: 2014-05-06 | Discharge: 2014-05-06 | Disposition: A | Discharge status: Home / Self Care | Payer: Medicare Other | Source: Ambulatory Visit | Attending: Orthopedic Surgery | Admitting: Orthopedic Surgery

## 2014-05-06 ENCOUNTER — Encounter: Payer: Self-pay | Admitting: Cardiovascular Disease

## 2014-05-06 VITALS — BP 106/56 | HR 72 | Ht 64.5 in | Wt 192.0 lb

## 2014-05-06 DIAGNOSIS — R0600 Dyspnea, unspecified: Secondary | ICD-10-CM

## 2014-05-06 DIAGNOSIS — R0989 Other specified symptoms and signs involving the circulatory and respiratory systems: Secondary | ICD-10-CM

## 2014-05-06 DIAGNOSIS — I2510 Atherosclerotic heart disease of native coronary artery without angina pectoris: Secondary | ICD-10-CM

## 2014-05-06 DIAGNOSIS — M542 Cervicalgia: Secondary | ICD-10-CM

## 2014-05-06 DIAGNOSIS — R0609 Other forms of dyspnea: Secondary | ICD-10-CM

## 2014-05-06 NOTE — Patient Instructions (Signed)
Your physician has requested that you have a lexiscan myoview. For further information please visit [email or web address]. Please follow instruction sheet, as given.  Your physician recommends that you continue on your current medications as directed. Please refer to the Current Medication list given to you today.  Your physician wants you to follow-up in: 6 months with Dr. Burt Knack.  You will receive a reminder letter in the mail two months in advance. If you don't receive a letter, please call our office to schedule the follow-up appointment.

## 2014-05-06 NOTE — Progress Notes (Signed)
HPI:  73 year old woman presenting for followup evaluation. She has been followed for coronary artery disease. She underwent heart catheterization in 2006 demonstrating mild nonobstructive CAD with 20-30% stenosis of the left main and LAD. There is no other significant disease noted. Left ventricular function was normal. She has carotid stenosis which has been graded at mild to moderate and she is followed by Dr. Donnetta Hutching. Her last myocardial perfusion scan was in November 2013 and this demonstrated no definite ischemia. The gated left ventricular ejection fraction was 80%. Lipids from April 2014 showed a cholesterol of 123, trig illustrates to 55, HDL 41, LDL 46 the LFTs were within limits.  When I saw her in January she presented with bilateral leg edema. Lab work demonstrated normal renal function and normal BNP. An echocardiogram demonstrated normal LV size and systolic function without significant valvular disease. There was a finding of mild aortic sclerosis.  The patient is considering neck surgery. She has developed gait unsteadiness. Cardiac complaints include exertional dyspnea. She has not had chest pain or pressure. Leg swelling is occurring only in the evening. Compression stockings were too expensive.    Outpatient Encounter Prescriptions as of 05/06/2014  Medication Sig  . anastrozole (ARIMIDEX) 1 MG tablet Take 1 mg by mouth daily.  Marland Kitchen aspirin 325 MG tablet Take 325 mg by mouth daily.  Marland Kitchen atorvastatin (LIPITOR) 20 MG tablet Take 1 tablet (20 mg total) by mouth daily.  . calcium-vitamin D (OSCAL WITH D) 500-200 MG-UNIT per tablet Take 1 tablet by mouth 2 (two) times daily.  . Coenzyme Q10 (CO Q-10) 100 MG CAPS Take 100 mg by mouth daily.  . cyanocobalamin (,VITAMIN B-12,) 1000 MCG/ML injection Inject 1,000 mcg into the muscle every 30 (thirty) days. po  . diclofenac (VOLTAREN) 75 MG EC tablet Take by mouth 2 (two) times daily.   . DULoxetine (CYMBALTA) 60 MG capsule Take 60 mg by mouth  daily.  . fexofenadine (ALLEGRA) 30 MG tablet Take 30 mg by mouth daily.  . fluticasone (FLONASE) 50 MCG/ACT nasal spray Place 2 sprays into the nose daily.  . furosemide (LASIX) 40 MG tablet Take 1 pill daily for 3 days then as needed for swelling  . glyBURIDE-metformin (GLUCOVANCE) 5-500 MG per tablet Take 2 tablets by mouth 2 (two) times daily with a meal.  . Insulin Lispro Prot & Lispro (HUMALOG MIX 75/25 KWIKPEN) (75-25) 100 UNIT/ML Kwikpen Inject 25 Units into the skin daily. 25 units in the morning and 20 units nightly  . levothyroxine (SYNTHROID, LEVOTHROID) 112 MCG tablet Take 112 mcg by mouth daily.  . metoprolol succinate (TOPROL-XL) 25 MG 24 hr tablet Take 25 mg by mouth daily.  . montelukast (SINGULAIR) 10 MG tablet Take 10 mg by mouth at bedtime.  Marland Kitchen omeprazole (PRILOSEC) 20 MG capsule Take 20 mg by mouth 2 (two) times daily.   . potassium chloride (K-DUR) 10 MEQ tablet Take 1 pill every time you take Lasix  . ramipril (ALTACE) 5 MG capsule Take 1 capsule (5 mg total) by mouth daily.  . temazepam (RESTORIL) 30 MG capsule Take 30 mg by mouth at bedtime as needed for sleep.  Marland Kitchen triamterene-hydrochlorothiazide (MAXZIDE-25) 37.5-25 MG per tablet Take 1 tablet by mouth daily.   . [DISCONTINUED] cyanocobalamin (,VITAMIN B-12,) 1000 MCG/ML injection Inject 1 mL (1,000 mcg total) into the muscle every 30 (thirty) days.  . [DISCONTINUED] ALPRAZolam (XANAX) 0.5 MG tablet Take 0.5 mg by mouth at bedtime.  . [DISCONTINUED] metoprolol succinate (TOPROL-XL) 25 MG  24 hr tablet Take 1 tablet (25 mg total) by mouth at bedtime. Take  with or immediately  following a meal.    Allergies  Allergen Reactions  . Codeine Hives  . Sulfonamide Derivatives Hives  . Sulfa Antibiotics Other (See Comments)    Past Medical History  Diagnosis Date  . Esophageal reflux   . Hiatal hernia   . Unspecified gastritis and gastroduodenitis without mention of hemorrhage   . Esophageal stricture   . Type II or  unspecified type diabetes mellitus without mention of complication, not stated as uncontrolled   . Fibromyalgia   . Hypertension   . Anxiety   . Depression   . Sleep apnea     CPAP  . Kidney stones   . Hypercholesterolemia   . IBS (irritable bowel syndrome)   . Cataract   . Cataract   . Anemia   . Coronary heart disease   . Pneumonia     recently, just finished ABO  . Carotid artery plaque     bilat  . Anginal pain   . Arthritis   . Dizziness   . PONV (postoperative nausea and vomiting)   . Breast cancer   . Thyroid disease 2002  . Hypothyroidism     ROS: Negative except as per HPI  BP 106/56  Pulse 72  Ht 5' 4.5" (1.638 m)  Wt 192 lb (87.091 kg)  BMI 32.46 kg/m2  PHYSICAL EXAM: Pt is alert and oriented, NAD HEENT: normal Neck: JVP - normal, carotids 2+= without bruits Lungs: CTA bilaterally CV: RRR without murmur or gallop Abd: soft, NT, Positive BS, no hepatomegaly Ext: no C/C/E, distal pulses intact and equal Skin: warm/dry no rash  EKG:  Normal sinus rhythm with age indeterminate inferior infarct.  2-D echocardiogram 12/24/2013: Study Conclusions  - Left ventricle: The cavity size was normal. There was mild concentric hypertrophy. Systolic function was normal. The estimated ejection fraction was in the range of 60% to 65%. Wall motion was normal; there were no regional wall motion abnormalities. Doppler parameters are consistent with abnormal left ventricular relaxation (grade 1 diastolic dysfunction). Doppler parameters are consistent with high ventricular filling pressure. - Aortic valve: Mild regurgitation. Impressions:  - No change when compared to prior ECHO.  Carotid Duplex 05/05/2013: Patent carotid arteries without high-grade obstruction. Complete study reviewed.  ASSESSMENT AND PLAN: 1. Coronary artery disease, native vessel, without angina. The patient does have shortness of breath which has been chronic. She has multiple ongoing risk  factors including diabetes, hypertension, and hyperlipidemia. She is going to require cervical spine surgery in the near future. Considering her low functional capacity (less than 4 metabolic equivalents), recommend risk stratification with a Lexiscan Myoview stress test.  2. Essential hypertension. Recommended continuation of current medical program.  3. Hyperlipidemia. She continues on atorvastatin. Last lipids reviewed as above.  4. Asymptomatic carotid stenosis. Followed by vascular surgery.  For followup I will see her back in 6 months. As long as her stress test is low risk, I think she can proceed with cervical spine surgery without any further preoperative testing.  Sherren Mocha MD 05/06/2014 9:32 AM

## 2014-05-07 ENCOUNTER — Other Ambulatory Visit: Payer: Medicare Other

## 2014-05-11 ENCOUNTER — Ambulatory Visit (HOSPITAL_COMMUNITY): Payer: Medicare Other | Attending: Cardiology | Admitting: Radiology

## 2014-05-11 VITALS — BP 138/56 | HR 71 | Ht 64.5 in | Wt 187.0 lb

## 2014-05-11 DIAGNOSIS — Z01810 Encounter for preprocedural cardiovascular examination: Secondary | ICD-10-CM

## 2014-05-11 DIAGNOSIS — R0600 Dyspnea, unspecified: Secondary | ICD-10-CM

## 2014-05-11 DIAGNOSIS — I2510 Atherosclerotic heart disease of native coronary artery without angina pectoris: Secondary | ICD-10-CM | POA: Diagnosis not present

## 2014-05-11 DIAGNOSIS — R079 Chest pain, unspecified: Secondary | ICD-10-CM | POA: Insufficient documentation

## 2014-05-11 DIAGNOSIS — R0609 Other forms of dyspnea: Secondary | ICD-10-CM | POA: Insufficient documentation

## 2014-05-11 DIAGNOSIS — R0989 Other specified symptoms and signs involving the circulatory and respiratory systems: Secondary | ICD-10-CM | POA: Insufficient documentation

## 2014-05-11 MED ORDER — REGADENOSON 0.4 MG/5ML IV SOLN
0.4000 mg | Freq: Once | INTRAVENOUS | Status: AC
Start: 2014-05-11 — End: 2014-05-11
  Administered 2014-05-11: 0.4 mg via INTRAVENOUS

## 2014-05-11 MED ORDER — TECHNETIUM TC 99M SESTAMIBI GENERIC - CARDIOLITE
11.0000 | Freq: Once | INTRAVENOUS | Status: AC | PRN
  Administered 2014-05-11: 11 via INTRAVENOUS

## 2014-05-11 MED ORDER — TECHNETIUM TC 99M SESTAMIBI GENERIC - CARDIOLITE
33.0000 | Freq: Once | INTRAVENOUS | Status: AC | PRN
  Administered 2014-05-11: 33 via INTRAVENOUS

## 2014-05-11 NOTE — Progress Notes (Signed)
[facility] [address] [place], [place] [address] [phone]    Cardiology Nuclear Med Study  Jeanne Kim is a 73 y.o. female     MRN : [id number]     DOB: March 21, 1941  Procedure Date: 05/11/2014  Nuclear Med Background Indication for Stress Test:  Evaluation for Ischemia, and Pending Surgical Clearance for  Neck Surgery by Phylliss Bob, MD History:  CAD, Cath (n/o CAD), Echo 2015 EF 60-65%, MPI 2013 (normal) EF 80% Cardiac Risk Factors: Carotid Disease, Strong Family History - CAD, Hypertension, IDDM, and Lipids  Symptoms:  Chest Pain (last date of chest discomfort was one week ago) and DOE   Nuclear Pre-Procedure Caffeine/Decaff Intake:  None> 12 hrs NPO After: 9:00pm   Lungs:  clear O2 Sat: 95% on room air. IV 0.9% NS with Angio Cath:  22g  IV Site: R Antecubital, tolerated well IV Started by:  Annye Rusk, CNMT  Chest Size (in):  40 Cup Size: C  Height: 5' 4.5" (1.638 m)  Weight:  187 lb (84.823 kg)  BMI:  Body mass index is 31.61 kg/(m^2). Tech Comments:  Fasting CBG was 107 at 0530 today. 1/2 dose Insulin last night, and no insulin or po diabetic medications today.Patient took Toprol last night. Jeanne Baltimore, RN.    Nuclear Med Study 1 or 2 day study: 1 day  Stress Test Type:  Carlton Adam  Reading MD: N//A  Order Authorizing Provider:  Sherren Mocha, MD  Resting Radionuclide: Technetium 3m Sestamibi  Resting Radionuclide Dose: 11.0 mCi   Stress Radionuclide:  Technetium 62m Sestamibi  Stress Radionuclide Dose: 33.0 mCi           Stress Protocol Rest HR: 71 Stress HR: 85  Rest BP: 138/56 Stress BP: 142/46  Exercise Time (min): n/a METS: n/a           Dose of Adenosine (mg):  n/a Dose of Lexiscan: 0.4 mg  Dose of Atropine (mg): n/a Dose of Dobutamine: n/a mcg/kg/min (at max HR)  Stress Test Technologist: Glade Lloyd, BS-ES  Nuclear Technologist:  Vedia Pereyra, CNMT     Rest Procedure:  Myocardial perfusion imaging was  performed at rest 45 minutes following the intravenous administration of Technetium 44m Sestamibi. Rest ECG: NSR with non-specific ST-T wave changes  Stress Procedure:  The patient received IV Lexiscan 0.4 mg over 15-seconds.  Technetium 84m Sestamibi injected at 30-seconds.  Quantitative spect images were obtained after a 45 minute delay.  During the infusion of Lexiscan the patient complained of feeling chest and stomach tightness and SOB.  These symptoms began to resolve in recovery.  Stress ECG: No significant change from baseline ECG  QPS Raw Data Images:  Normal; no motion artifact; normal heart/lung ratio. Stress Images:  Normal homogeneous uptake in all areas of the myocardium. Rest Images:  Normal homogeneous uptake in all areas of the myocardium. Subtraction (SDS):  No evidence of ischemia. Transient Ischemic Dilatation (Normal <1.22):  1.13 Lung/Heart Ratio (Normal <0.45):  0.31  Quantitative Gated Spect Images QGS EDV:  70 ml QGS ESV:  16 ml  Impression Exercise Capacity:  Lexiscan with no exercise. BP Response:  Normal blood pressure response. Clinical Symptoms:  Mild chest pain/dyspnea. ECG Impression:  No significant ST segment change suggestive of ischemia. Comparison with Prior Nuclear Study: No significant change from previous study  Overall Impression:  Normal stress nuclear study.  LV Ejection Fraction: 77%.  LV Wall Motion:  NL LV Function; NL Wall  Motion  Jeanne Kim 05/11/2014

## 2014-05-21 ENCOUNTER — Ambulatory Visit: Payer: Medicare Other | Admitting: Family

## 2014-05-21 ENCOUNTER — Other Ambulatory Visit (HOSPITAL_COMMUNITY): Payer: Medicare Other

## 2014-05-26 ENCOUNTER — Other Ambulatory Visit: Payer: Self-pay | Admitting: Internal Medicine

## 2014-05-26 DIAGNOSIS — G8929 Other chronic pain: Secondary | ICD-10-CM

## 2014-05-26 DIAGNOSIS — M545 Low back pain, unspecified: Secondary | ICD-10-CM

## 2014-06-03 ENCOUNTER — Ambulatory Visit
Admission: RE | Admit: 2014-06-03 | Discharge: 2014-06-03 | Disposition: A | Discharge status: Home / Self Care | Payer: Medicare Other | Source: Ambulatory Visit | Attending: Internal Medicine | Admitting: Internal Medicine

## 2014-06-03 DIAGNOSIS — M545 Low back pain, unspecified: Secondary | ICD-10-CM

## 2014-06-03 DIAGNOSIS — G8929 Other chronic pain: Secondary | ICD-10-CM

## 2014-06-03 MED ORDER — IOHEXOL 300 MG/ML  SOLN
1.0000 mL | Freq: Once | INTRAMUSCULAR | Status: AC | PRN
  Administered 2014-06-03: 1 mL via EPIDURAL

## 2014-06-03 MED ORDER — TRIAMCINOLONE ACETONIDE 40 MG/ML IJ SUSP (RADIOLOGY)
60.0000 mg | Freq: Once | INTRAMUSCULAR | Status: AC
  Administered 2014-06-03: 60 mg via EPIDURAL

## 2014-06-03 NOTE — Discharge Instructions (Signed)

## 2014-06-18 ENCOUNTER — Institutional Professional Consult (permissible substitution): Payer: Medicare Other | Admitting: Pulmonary Disease

## 2014-07-07 ENCOUNTER — Other Ambulatory Visit: Payer: Self-pay | Admitting: Cardiovascular Disease

## 2014-07-28 ENCOUNTER — Encounter: Payer: Self-pay | Admitting: Pulmonary Disease

## 2014-07-28 ENCOUNTER — Ambulatory Visit (INDEPENDENT_AMBULATORY_CARE_PROVIDER_SITE_OTHER): Payer: Medicare Other | Admitting: Pulmonary Disease

## 2014-07-28 VITALS — BP 90/72 | HR 67 | Temp 98.0°F | Ht 65.0 in | Wt 186.4 lb

## 2014-07-28 DIAGNOSIS — G4733 Obstructive sleep apnea (adult) (pediatric): Secondary | ICD-10-CM

## 2014-07-28 NOTE — Patient Instructions (Signed)
Will schedule for a sleep study, and will call once the results are available.

## 2014-07-28 NOTE — Assessment & Plan Note (Signed)
The patient has a long history of obstructive sleep apnea, but has not used CPAP in years because it aggravates her. She has really not kept up with her followup visits so that we can work with her on treatment and trouble shoot her device. However, she has lost significant weight by her history since the last visit, and the question is raised whether she may and still has sleep apnea or not. She has a long sleep disruption related to chronic pain, as well as insomnia. At this point, I think we need to put the issue of sleep apnea to rest, and will schedule her for a followup study.

## 2014-07-28 NOTE — Progress Notes (Signed)
Subjective:    Patient ID: Jeanne Kim, female    DOB: [birth year], 73 y.o.   MRN: [id number]  HPI The patient is a 73 year old female who I've been asked to see regarding obstructive sleep apnea. I have seen her in the distant past, and she has been on CPAP before for her sleep apnea. Unfortunately, there are no records available to me. The patient tells me that she is not use CPAP in years because it bothered her, but is continuing to have issues with her sleep.  She has had long-standing insomnia, but she tells me that she does not feel rested in the mornings upon arising, even though she has had adequate sleep. She admits to having chronic musculoskeletal pain that she thinks affects her sleep.  The patient states she is unsure if she snores or has an abnormal breathing pattern during sleep. Her husband sleeps in a different room she has lost a lot of weight over the last few years. She denies being overly sleepy during the day with inactivity, and has no sleepiness with driving.   Sleep Questionnaire What time do you typically go to bed?( Between what hours) 12a-1a 12a-1a at 1429 on 07/28/14 by Inge Rise, CMA How long does it take you to fall asleep? if takes temazepam 30 min-1 hr. if does not take medication, then takes several hrs if takes temazepam 30 min-1 hr. if does not take medication, then takes several hrs at 1429 on 07/28/14 by Inge Rise, CMA How many times during the night do you wake up? 2 2 at 1429 on 07/28/14 by Inge Rise, [place] What time do you get out of bed to start your day? 0900 0900 at 1429 on 07/28/14 by Inge Rise, CMA Do you drive or operate heavy machinery in your occupation? No No at 1429 on 07/28/14 by Inge Rise, CMA How much has your weight changed (up or down) over the past two years? (In pounds) 15 lb (6.804 kg) 15 lb (6.804 kg) at 1429 on 07/28/14 by Inge Rise, CMA Have you ever had a sleep study before? Yes Yes at 1429 on  07/28/14 by Inge Rise, CMA If yes, location of study? [facility] at 1429 on 07/28/14 by Inge Rise, CMA If yes, date of study? 8-10 years ago 8-10 years ago at 1429 on 07/28/14 by Inge Rise, CMA Do you currently use CPAP? No No at 1429 on 07/28/14 by Inge Rise, CMA Do you wear oxygen at any time? No No at 1429 on 07/28/14 by Inge Rise, CMA   Review of Systems  Constitutional: Negative for fever and unexpected weight change.  HENT: Positive for congestion. Negative for dental problem, ear pain, nosebleeds, postnasal drip, rhinorrhea, sinus pressure, sneezing, sore throat and trouble swallowing.   Eyes: Negative for redness and itching.  Respiratory: Positive for cough and shortness of breath. Negative for chest tightness and wheezing.   Cardiovascular: Positive for chest pain and leg swelling. Negative for palpitations.  Gastrointestinal: Negative for nausea and vomiting.  Genitourinary: Negative for dysuria.  Musculoskeletal: Positive for arthralgias. Negative for joint swelling.  Skin: Negative for rash.  Neurological: Negative for headaches.  Hematological: Does not bruise/bleed easily.  Psychiatric/Behavioral: Negative for dysphoric mood. The patient is not nervous/anxious.        Objective:   Physical Exam Constitutional:  Well developed, no acute distress  HENT:  Nares patent without discharge  Oropharynx without exudate, palate  and uvula are mildly elongated.  Eyes:  Perrla, eomi, no scleral icterus  Neck:  No JVD, no TMG  Cardiovascular:  Normal rate, regular rhythm, no rubs or gallops.  No murmurs        Intact distal pulses  Pulmonary :  Normal breath sounds, no stridor or respiratory distress   No rales, rhonchi, or wheezing  Abdominal:  Soft, nondistended, bowel sounds present.  No tenderness noted.   Musculoskeletal:  mild lower extremity edema noted.  Lymph Nodes:  No cervical lymphadenopathy noted  Skin:  No cyanosis  noted  Neurologic:  Alert, appropriate, moves all 4 extremities without obvious deficit.         Assessment & Plan:

## 2014-10-18 ENCOUNTER — Ambulatory Visit (HOSPITAL_BASED_OUTPATIENT_CLINIC_OR_DEPARTMENT_OTHER): Payer: Medicare Other | Attending: Pulmonary Disease

## 2014-10-18 DIAGNOSIS — G4733 Obstructive sleep apnea (adult) (pediatric): Secondary | ICD-10-CM | POA: Insufficient documentation

## 2014-11-01 DIAGNOSIS — G4733 Obstructive sleep apnea (adult) (pediatric): Secondary | ICD-10-CM

## 2014-11-01 NOTE — Progress Notes (Signed)
Needs ov to review sleep study results.

## 2014-11-01 NOTE — Sleep Study (Signed)
   NAME: Jeanne Kim DATE OF BIRTH:  12-16-1940 MEDICAL RECORD NUMBER [id number]  LOCATION: [facility]  PHYSICIAN: [place] OF STUDY: 10/18/2014  SLEEP STUDY TYPE: Nocturnal Polysomnogram               REFERRING PHYSICIAN: Catalena Stanhope, Armando Reichert, MD  INDICATION FOR STUDY: Hypersomnia with sleep apnea  EPWORTH SLEEPINESS SCORE:  4 HEIGHT: 5\' 5"  (165.1 cm)  WEIGHT: 178 lb (80.74 kg)    Body mass index is 29.62 kg/(m^2).  NECK SIZE: 15 in.  MEDICATIONS: Reviewed in the sleep record  SLEEP ARCHITECTURE: The patient had a total sleep time of 258 minutes, with very little slow-wave sleep and no REM. Sleep onset latency was mildly prolonged at 37 minutes, and sleep efficiency was moderately reduced at 70%.  RESPIRATORY DATA: The patient was found to have no obstructive apneas, and 47 obstructive hypopneas, giving her an AHI of 11 events per hour. The events occurred in all body positions, and there was moderate snoring noted throughout.  OXYGEN DATA: There was transient oxygen desaturation as low as 84% with the patient's obstructive events  CARDIAC DATA: No clinically significant arrhythmias were noted  MOVEMENT/PARASOMNIA: No significant periodic limb movements or abnormal behaviors were seen.  IMPRESSION/ RECOMMENDATION:    1) mild obstructive sleep apnea/hypopnea syndrome, with an AHI of 11 events per hour and oxygen desaturation as low as 84%. Treatment for this degree of sleep apnea can include a trial of weight loss alone, upper airway surgery, dental appliance, and also CPAP. Correlation is suggested.    [place], American Board of Sleep Medicine  ELECTRONICALLY SIGNED ON:  11/01/2014, 5:55 PM [place] PH: [phone]   FX: [phone] [place]

## 2014-11-02 NOTE — Progress Notes (Signed)
Called pt and appt scheduled for 1/28

## 2014-11-04 ENCOUNTER — Ambulatory Visit: Payer: Medicare Other | Admitting: Pulmonary Disease

## 2014-11-08 ENCOUNTER — Encounter: Payer: Self-pay | Admitting: Cardiovascular Disease

## 2014-11-08 ENCOUNTER — Ambulatory Visit (INDEPENDENT_AMBULATORY_CARE_PROVIDER_SITE_OTHER): Payer: Medicare Other | Admitting: Cardiovascular Disease

## 2014-11-08 ENCOUNTER — Ambulatory Visit (INDEPENDENT_AMBULATORY_CARE_PROVIDER_SITE_OTHER): Payer: Medicare Other | Admitting: Pulmonary Disease

## 2014-11-08 ENCOUNTER — Encounter: Payer: Self-pay | Admitting: Pulmonary Disease

## 2014-11-08 VITALS — BP 122/74 | HR 75 | Temp 97.6°F | Ht 65.0 in | Wt 183.0 lb

## 2014-11-08 VITALS — BP 106/54 | HR 78 | Ht 65.0 in | Wt 183.0 lb

## 2014-11-08 DIAGNOSIS — I25118 Atherosclerotic heart disease of native coronary artery with other forms of angina pectoris: Secondary | ICD-10-CM

## 2014-11-08 DIAGNOSIS — G4733 Obstructive sleep apnea (adult) (pediatric): Secondary | ICD-10-CM

## 2014-11-08 NOTE — Progress Notes (Signed)
   Subjective:    Patient ID: Jeanne Kim, female    DOB: [birth year], 74 y.o.   MRN: [id number]  HPI The patient comes in today for follow-up of her recent sleep study. She was found to have only mild obstructive sleep apnea, with an AHI of 11 events per hour. I have reviewed the study with her in detail, and answered all of her questions.   Review of Systems  Constitutional: Negative for fever and unexpected weight change.  HENT: Negative for congestion, dental problem, ear pain, nosebleeds, postnasal drip, rhinorrhea, sinus pressure, sneezing, sore throat and trouble swallowing.   Eyes: Negative for redness and itching.  Respiratory: Negative for cough, chest tightness, shortness of breath and wheezing.   Cardiovascular: Negative for palpitations and leg swelling.  Gastrointestinal: Negative for nausea and vomiting.  Genitourinary: Negative for dysuria.  Musculoskeletal: Negative for joint swelling.  Skin: Negative for rash.  Neurological: Negative for headaches.  Hematological: Does not bruise/bleed easily.  Psychiatric/Behavioral: Negative for dysphoric mood. The patient is not nervous/anxious.        Objective:   Physical Exam Overweight female in no acute distress Nose without purulence or discharge noted Neck without lymphadenopathy or thyromegaly Lower extremities without significant edema, no cyanosis Alert and oriented, moves all 4 extremities.       Assessment & Plan:

## 2014-11-08 NOTE — Patient Instructions (Signed)
You have mild sleep apnea, and the good news is this does not have a significant impact on your health. Treatment for mild sleep apnea can include continuing to work on weight loss, or you can consider a dental appliance as we discussed.  Think about this, and let us know this week.

## 2014-11-08 NOTE — Progress Notes (Signed)
Cardiology Office Note   Date:  11/09/2014   ID:  Jeanne, Kim Mar 07, 1941, MRN [id number]  PCP:  Neale Burly, MD  Cardiologist:  Sherren Mocha, MD    Chief Complaint  Patient presents with  . Follow-up     History of Present Illness: Jeanne Kim is a 74 y.o. female who presents for follow-up of nonobstructive CAD. She underwent heart catheterization in 2006 demonstrating mild nonobstructive CAD with 20-30% stenosis of the left main and LAD. There is no other significant disease noted. Left ventricular function was normal. She has carotid stenosis which has been graded at mild to moderate and she is followed by Dr. Donnetta Hutching.  Current complaints include chest pain, poor energy level, and leg swelling. States 'I have no energy to do anything.' Reports chest pain with activity but no change over time. She has not required sublingual NTG. Pt is a lifelong nonsmoker. Last carotid study in 2014 showed mildly elevated velocities in the left ICA related to 'kink' rather than atherosclerosis. Reports that diabetes control has only be marginal because of poor diet.   Past Medical History  Diagnosis Date  . Esophageal reflux   . Hiatal hernia   . Unspecified gastritis and gastroduodenitis without mention of hemorrhage   . Esophageal stricture   . Type II or unspecified type diabetes mellitus without mention of complication, not stated as uncontrolled   . Fibromyalgia   . Hypertension   . Anxiety   . Depression   . Sleep apnea     CPAP  . Kidney stones   . Hypercholesterolemia   . IBS (irritable bowel syndrome)   . Cataract   . Anemia   . Coronary heart disease   . Pneumonia     recently, just finished ABO  . Carotid artery plaque     bilat  . Anginal pain   . Arthritis   . Dizziness   . PONV (postoperative nausea and vomiting)   . Breast cancer   . Thyroid disease 2002  . Hypothyroidism     Past Surgical History  Procedure Laterality Date  . Cardiac  catheterization    . Total abdominal hysterectomy    . Appendectomy    . Cystocele repair    . Rectocele repair    . Thyroidectomy    . Breast lumpectomy      left  . Cervical fusion    . Cholecystectomy    . Tonsillectomy    . Cesarean section    . Cataract extraction w/phaco Left 03/30/2013    Procedure: CATARACT EXTRACTION PHACO AND INTRAOCULAR LENS PLACEMENT (IOC);  Surgeon: Tonny Branch, MD;  Location: [facility];  Service: Ophthalmology;  Laterality: Left;  CDE:19.28  . Cataract extraction w/phaco Right 04/09/2013    Procedure: CATARACT EXTRACTION PHACO AND INTRAOCULAR LENS PLACEMENT (IOC);  Surgeon: Tonny Branch, MD;  Location: [facility];  Service: Ophthalmology;  Laterality: Right;  CDE: 15.44  . Eye surgery Left 03-30-13    Cataract  . Eye surgery Right 04-09-13    Cataract  . Dilation and curettage of uterus      Current Outpatient Prescriptions  Medication Sig Dispense Refill  . anastrozole (ARIMIDEX) 1 MG tablet Take 1 mg by mouth daily.    . Artificial Tear Solution (SOOTHE XP) SOLN Place 1 drop into both eyes every morning.   1  . aspirin 325 MG tablet Take 325 mg by mouth daily.    Marland Kitchen atorvastatin (LIPITOR) 20 MG tablet Take 1  tablet (20 mg total) by mouth daily. 90 tablet 3  . BELSOMRA 10 MG TABS Take 1 tablet by mouth daily.   0  . calcium-vitamin D (OSCAL WITH D) 500-200 MG-UNIT per tablet Take 1 tablet by mouth 2 (two) times daily.    . Coenzyme Q10 (CO Q-10) 100 MG CAPS Take 100 mg by mouth daily.    . diclofenac (VOLTAREN) 75 MG EC tablet Take by mouth 2 (two) times daily.     . DULoxetine (CYMBALTA) 60 MG capsule Take 60 mg by mouth daily.    . fexofenadine (ALLEGRA) 30 MG tablet Take 30 mg by mouth daily.    . fluticasone (FLONASE) 50 MCG/ACT nasal spray Place 2 sprays into the nose daily.    . furosemide (LASIX) 40 MG tablet Take 1 pill daily for 3 days then as needed for swelling 30 tablet 6  . gabapentin (NEURONTIN) 300 MG capsule Take 300 mg by mouth at bedtime.    Marland Kitchen  gentamicin (GARAMYCIN) 0.3 % ophthalmic solution   0  . glipiZIDE (GLUCOTROL) 10 MG tablet Take 10 mg by mouth daily before breakfast.    . Insulin Lispro Prot & Lispro (HUMALOG MIX 75/25 KWIKPEN) (75-25) 100 UNIT/ML Kwikpen Inject 25 Units into the skin daily. 25 units in the morning and 20 units nightly    . levothyroxine (SYNTHROID, LEVOTHROID) 112 MCG tablet Take 112 mcg by mouth daily.    Marland Kitchen LINZESS 145 MCG CAPS capsule Take 145 mcg by mouth daily.  1  . metFORMIN (GLUCOPHAGE) 1000 MG tablet Take 1,000 mg by mouth 2 (two) times daily with a meal.    . metoprolol succinate (TOPROL-XL) 25 MG 24 hr tablet Take 1 tablet by mouth at  bedtime. Take  with or  immediately  following a  meal. 90 tablet 0  . midodrine (PROAMATINE) 2.5 MG tablet Take 2.5 mg by mouth 3 (three) times daily.  0  . montelukast (SINGULAIR) 10 MG tablet Take 10 mg by mouth at bedtime.    Marland Kitchen omeprazole (PRILOSEC) 20 MG capsule Take 20 mg by mouth 2 (two) times daily.     . potassium chloride (K-DUR,KLOR-CON) 10 MEQ tablet Take 10 mEq by mouth once. Take one tablet every time you take lasix    . ramipril (ALTACE) 5 MG capsule Take 1 capsule (5 mg total) by mouth daily. 90 capsule 3  . temazepam (RESTORIL) 30 MG capsule Take 30 mg by mouth at bedtime as needed for sleep.    . traMADol (ULTRAM) 50 MG tablet Take 50 mg by mouth. As needed    . triamterene-hydrochlorothiazide (MAXZIDE-25) 37.5-25 MG per tablet Take 1 tablet by mouth daily.     Marland Kitchen VAGIFEM 10 MCG TABS vaginal tablet Place 1 tablet vaginally once a week.   0  . vitamin B-12 (CYANOCOBALAMIN) 250 MCG tablet Take 250 mcg by mouth daily.     No current facility-administered medications for this visit.    Allergies:   Codeine and Sulfonamide derivatives   Social History:  The patient  reports that she has never smoked. She has never used smokeless tobacco. She reports that she does not drink alcohol or use illicit drugs.   Family History:  The patient's family history  includes Cancer in her father and mother; Colon cancer in her maternal uncle; Colon polyps in her maternal uncle; Diabetes in her father, maternal aunt, maternal uncle, and mother; Esophageal cancer in her maternal uncle; Heart disease in her father and paternal grandmother;  Hyperlipidemia in her father; Hypertension in her father and mother; Lung cancer in her father and maternal uncle; Ovarian cancer in her maternal aunt; Sarcoidosis in her mother; Urolithiasis in her father.    ROS:  Please see the history of present illness.  Otherwise, review of systems is positive for leg swelling, depression, back pain, dizziness, fatigue, wheezing, constipation, anxiety, gait problems.  All other systems are reviewed and negative.   PHYSICAL EXAM: VS:  BP 106/54 mmHg  Pulse 78  Ht 5\' 5"  (1.651 m)  Wt 183 lb (83.008 kg)  BMI 30.45 kg/m2 , BMI Body mass index is 30.45 kg/(m^2). GEN: Well nourished, well developed, in no acute distress HEENT: normal Neck: no JVD, carotid bruits, or masses Cardiac: RRR without murmur or gallop                No peripheral edema Respiratory:  clear to auscultation bilaterally, normal work of breathing GI: soft, nontender, nondistended, + BS MS: no deformity or atrophy Skin: warm and dry, no rash Neuro:  Strength and sensation are intact Psych: euthymic mood, full affect  EKG:  EKG is not ordered today.   Recent Labs: No results found for requested labs within last 365 days.   Lipid Panel     Component Value Date/Time   CHOL 123 01/20/2013 1626   TRIG 255.0* 01/20/2013 1626   HDL 41.10 01/20/2013 1626   CHOLHDL 3 01/20/2013 1626   VLDL 51.0* 01/20/2013 1626   LDLCALC 41 12/28/2008 1140   LDLDIRECT 46.2 01/20/2013 1626      Wt Readings from Last 3 Encounters:  11/08/14 183 lb (83.008 kg)  11/08/14 183 lb (83.008 kg)  10/18/14 178 lb (80.74 kg)     Cardiac Studies Reviewed: Myoview 05/11/2014: Overall Impression: Normal stress nuclear study.  LV  Ejection Fraction: 77%. LV Wall Motion: NL LV Function; NL Wall Motion  2D Echo 12/24/2013: Study Conclusions  - Left ventricle: The cavity size was normal. There was mild concentric hypertrophy. Systolic function was normal. The estimated ejection fraction was in the range of 60% to 65%. Wall motion was normal; there were no regional wall motion abnormalities. Doppler parameters are consistent with abnormal left ventricular relaxation (grade 1 diastolic dysfunction). Doppler parameters are consistent with high ventricular filling pressure. - Aortic valve: Mild regurgitation. Impressions:  - No change when compared to prior ECHO.  ASSESSMENT AND PLAN: 1.  CAD, native vessel, with stable angina, CCS Class 2: Not sure her chest pain is cardiac-related, but in reviewing notes dating back 10 years she has had chronic angina without evidence of high-grade coronary obstruction. Continue ASA, atorvastatin, Toprol XL, ramipril.  2. Essential HTN: BP well-controlled on current Rx.  3. Hyperlipidemia: on atorvastatin. Hypertriglyceridemia likely reflective of poor glycemic control and dietary noncompliance. Lifestyle modification reviewed.  4. Type 2 DM, managed by PCP  5. Asymptomatic carotid stenosis: followed by vascular surgery.   Current medicines are reviewed with the patient today.  The patient does not have concerns regarding medicines.  The following changes have been made:  no change  Labs/ tests ordered today include: none No orders of the defined types were placed in this encounter.    Disposition:   FU with me in 6 months  Signed, Sherren Mocha, MD  11/09/2014 5:27 AM    [facility] [address], [place], [place]  [address] Phone: [phone]; Fax: [phone]

## 2014-11-08 NOTE — Patient Instructions (Signed)
Your physician wants you to follow-up in: 6 MONTHS with Dr Cooper.  You will receive a reminder letter in the mail two months in advance. If you don't receive a letter, please call our office to schedule the follow-up appointment.  Your physician recommends that you continue on your current medications as directed. Please refer to the Current Medication list given to you today.  

## 2014-11-08 NOTE — Assessment & Plan Note (Signed)
The patient has only mild obstructive sleep apnea by her recent sleep study, and I have reviewed with her the various treatment options. This can include continuing with her weight reduction, a dental appliance, and finally C Pap. I have explained to her this degree of sleep apnea is not a health risk, but can potentially impact her quality of life. It is really unclear to me whether her mild sleep apnea has anything to do with her symptoms, since she has known chronic insomnia and chronic pain syndrome which also disrupts her sleep at night. She really did not do well with C Pap, and therefore I have asked her to consider a conservative treatment path with ongoing weight loss versus a trial of a dental appliance. To give this some thought over the next few days, and will let us know when she has made a decision.

## 2015-01-20 ENCOUNTER — Telehealth: Payer: Self-pay

## 2015-01-20 NOTE — Telephone Encounter (Signed)
Phone call from pt.  Reported she has intermittent "pressure and burning" in her left side of her neck.  Reported this has been occurring approx. 1-2 weeks.  Denies any external signs of swelling, redness, tenderness, or pain.  Stated the burning and pressure sensation only lasts approx. 30 sec., and then subsides.  Denies any recent strain or injury to the left neck. Discussed with Dr. Oneida Alar.  Recommended she go to see her PCP for the above complaints.  Call ret'd to pt. Re: need to contact her PCP for the above symptoms; voice message left with Dr. Oneida Alar recommendations.

## 2015-01-20 NOTE — Telephone Encounter (Signed)
She didn't indicate that she had to see Dr. Oneida Alar,  Only that she preferred not to be sched. With the NP.

## 2015-01-25 NOTE — Telephone Encounter (Signed)
LM for pt with new appt date/time when TFE is here, dpm

## 2015-03-18 ENCOUNTER — Encounter: Payer: Self-pay | Admitting: Gastroenterology

## 2015-03-23 ENCOUNTER — Other Ambulatory Visit: Payer: Self-pay | Admitting: Internal Medicine

## 2015-03-23 DIAGNOSIS — M5416 Radiculopathy, lumbar region: Secondary | ICD-10-CM

## 2015-03-31 ENCOUNTER — Ambulatory Visit
Admission: RE | Admit: 2015-03-31 | Discharge: 2015-03-31 | Disposition: A | Discharge status: Home / Self Care | Payer: Medicare Other | Source: Ambulatory Visit | Attending: Internal Medicine | Admitting: Internal Medicine

## 2015-03-31 DIAGNOSIS — M5416 Radiculopathy, lumbar region: Secondary | ICD-10-CM

## 2015-03-31 MED ORDER — IOHEXOL 180 MG/ML  SOLN
1.0000 mL | Freq: Once | INTRAMUSCULAR | Status: AC | PRN
  Administered 2015-03-31: 1 mL via EPIDURAL

## 2015-03-31 MED ORDER — METHYLPREDNISOLONE ACETATE 40 MG/ML INJ SUSP (RADIOLOG
120.0000 mg | Freq: Once | INTRAMUSCULAR | Status: AC
  Administered 2015-03-31: 120 mg via EPIDURAL

## 2015-03-31 NOTE — Discharge Instructions (Signed)

## 2015-04-04 ENCOUNTER — Other Ambulatory Visit: Payer: Self-pay

## 2015-04-12 ENCOUNTER — Encounter: Payer: Self-pay | Admitting: Internal Medicine

## 2015-04-22 ENCOUNTER — Encounter: Payer: Self-pay | Admitting: Vascular Surgery

## 2015-04-26 ENCOUNTER — Ambulatory Visit: Payer: Medicare Other | Admitting: Vascular Surgery

## 2015-04-26 ENCOUNTER — Inpatient Hospital Stay (HOSPITAL_COMMUNITY): Admission: RE | Admit: 2015-04-26 | Payer: Self-pay | Source: Ambulatory Visit

## 2015-05-03 ENCOUNTER — Other Ambulatory Visit (HOSPITAL_COMMUNITY): Payer: Medicare Other

## 2015-05-03 ENCOUNTER — Ambulatory Visit: Payer: Medicare Other | Admitting: Family

## 2015-05-06 ENCOUNTER — Encounter: Payer: Self-pay | Admitting: Gastroenterology

## 2015-06-29 ENCOUNTER — Ambulatory Visit: Payer: Medicare Other | Admitting: Internal Medicine

## 2015-07-15 ENCOUNTER — Other Ambulatory Visit: Payer: Medicare Other

## 2015-07-15 ENCOUNTER — Other Ambulatory Visit (INDEPENDENT_AMBULATORY_CARE_PROVIDER_SITE_OTHER): Payer: Medicare Other

## 2015-07-15 ENCOUNTER — Ambulatory Visit (INDEPENDENT_AMBULATORY_CARE_PROVIDER_SITE_OTHER): Payer: Medicare Other | Admitting: Gastroenterology

## 2015-07-15 ENCOUNTER — Encounter: Payer: Self-pay | Admitting: Gastroenterology

## 2015-07-15 VITALS — BP 94/50 | HR 76 | Ht 64.0 in | Wt 192.0 lb

## 2015-07-15 DIAGNOSIS — K589 Irritable bowel syndrome without diarrhea: Secondary | ICD-10-CM | POA: Diagnosis not present

## 2015-07-15 DIAGNOSIS — K5900 Constipation, unspecified: Secondary | ICD-10-CM

## 2015-07-15 DIAGNOSIS — K5902 Outlet dysfunction constipation: Secondary | ICD-10-CM

## 2015-07-15 DIAGNOSIS — R159 Full incontinence of feces: Secondary | ICD-10-CM | POA: Diagnosis not present

## 2015-07-15 DIAGNOSIS — R140 Abdominal distension (gaseous): Secondary | ICD-10-CM | POA: Diagnosis not present

## 2015-07-15 LAB — CBC WITH DIFFERENTIAL/PLATELET
BASOS PCT: 0.6 % (ref 0.0–3.0)
Basophils Absolute: 0.1 10*3/uL (ref 0.0–0.1)
EOS PCT: 5.6 % — AB (ref 0.0–5.0)
Eosinophils Absolute: 0.5 10*3/uL (ref 0.0–0.7)
HCT: 33.5 % — ABNORMAL LOW (ref 36.0–46.0)
HEMOGLOBIN: 11.1 g/dL — AB (ref 12.0–15.0)
LYMPHS ABS: 2.1 10*3/uL (ref 0.7–4.0)
Lymphocytes Relative: 22 % (ref 12.0–46.0)
MCHC: 33.2 g/dL (ref 30.0–36.0)
MCV: 83.2 fl (ref 78.0–100.0)
MONO ABS: 1.2 10*3/uL — AB (ref 0.1–1.0)
MONOS PCT: 12.6 % — AB (ref 3.0–12.0)
Neutro Abs: 5.7 10*3/uL (ref 1.4–7.7)
Neutrophils Relative %: 59.2 % (ref 43.0–77.0)
Platelets: 337 10*3/uL (ref 150.0–400.0)
RBC: 4.03 Mil/uL (ref 3.87–5.11)
RDW: 15.5 % (ref 11.5–15.5)
WBC: 9.7 10*3/uL (ref 4.0–10.5)

## 2015-07-15 LAB — IBC PANEL
Iron: 68 ug/dL (ref 42–145)
Saturation Ratios: 17.9 % — ABNORMAL LOW (ref 20.0–50.0)
TRANSFERRIN: 271 mg/dL (ref 212.0–360.0)

## 2015-07-15 LAB — BASIC METABOLIC PANEL
BUN: 24 mg/dL — ABNORMAL HIGH (ref 6–23)
CALCIUM: 9 mg/dL (ref 8.4–10.5)
CO2: 31 meq/L (ref 19–32)
CREATININE: 1.02 mg/dL (ref 0.40–1.20)
Chloride: 97 mEq/L (ref 96–112)
GFR: 56.57 mL/min — ABNORMAL LOW (ref >60.00)
Glucose, Bld: 282 mg/dL — ABNORMAL HIGH (ref 70–99)
Potassium: 4.2 mEq/L (ref 3.5–5.1)
Sodium: 135 mEq/L (ref 135–145)

## 2015-07-15 LAB — VITAMIN B12: Vitamin B-12: 221 pg/mL (ref 211–911)

## 2015-07-15 LAB — FERRITIN: Ferritin: 24.3 ng/mL (ref 10.0–291.0)

## 2015-07-15 LAB — FOLATE

## 2015-07-15 MED ORDER — LINACLOTIDE 290 MCG PO CAPS: 290.0000 ug | ORAL_CAPSULE | Freq: Every day | ORAL | Status: DC

## 2015-07-15 NOTE — Progress Notes (Signed)
Jeanne Kim    [id number]    09/26/1941  Chief complaint:  Chronic constipation, fecal incontinence, irritable bowel syndrome  HPI: 74 year old lady with history of hysterectomy status post repair of cystocele, rectocele and enterocele, s/p multiple abdominal surgeries  and adhesiolysis here with complaints of chronic constipation and intermittent fecal incontinence. Patient had hysterectomy in 1999 and subsequently had repair of cystocele rectocele and enterocele, she has been having constipation bloating nausea for the past 15-20 years. She currently takes laxatives every day and has bowel movement every 2-3 days, she has fecal urgency and incontinence every time she has a bowel movement, she also has gas incontinence. She feels bloated after every meal. Her dysphagia has improved significantly status post esophageal dilation. Denies vomiting. No history of hematemesis or melena. She also complains of intermittent rectal pain.    Outpatient Encounter Prescriptions as of 07/15/2015  Medication Sig  . aspirin 325 MG tablet Take 325 mg by mouth daily.  Marland Kitchen atorvastatin (LIPITOR) 20 MG tablet Take 1 tablet (20 mg total) by mouth daily.  . BELSOMRA 10 MG TABS Take 1 tablet by mouth daily.   . calcium-vitamin D (OSCAL WITH D) 500-200 MG-UNIT per tablet Take 1 tablet by mouth 2 (two) times daily.  . Coenzyme Q10 (CO Q-10) 100 MG CAPS Take 100 mg by mouth daily.  . CycloSPORINE (RESTASIS OP) Apply 1 drop to eye daily.  . diclofenac (VOLTAREN) 75 MG EC tablet Take by mouth 2 (two) times daily.   . DULoxetine (CYMBALTA) 60 MG capsule Take 60 mg by mouth daily.  . fexofenadine (ALLEGRA) 30 MG tablet Take 30 mg by mouth daily.  . fluticasone (FLONASE) 50 MCG/ACT nasal spray Place 2 sprays into the nose daily.  . furosemide (LASIX) 40 MG tablet Take 1 pill daily for 3 days then as needed for swelling  . gabapentin (NEURONTIN) 300 MG capsule Take 300 mg by mouth at bedtime.  Marland Kitchen glipiZIDE  (GLUCOTROL) 10 MG tablet Take 10 mg by mouth daily before breakfast.  . Insulin Lispro Prot & Lispro (HUMALOG MIX 75/25 KWIKPEN) (75-25) 100 UNIT/ML Kwikpen Inject 25 Units into the skin daily. 25 units in the morning and 20 units nightly  . levothyroxine (SYNTHROID, LEVOTHROID) 125 MCG tablet Take 1 tablet by mouth daily.  . Liraglutide 18 MG/3ML SOPN Inject 0.6 mg into the skin daily.  . metFORMIN (GLUCOPHAGE) 1000 MG tablet Take 1,000 mg by mouth 2 (two) times daily with a meal.  . metoprolol succinate (TOPROL-XL) 25 MG 24 hr tablet Take 1 tablet by mouth at  bedtime. Take  with or  immediately  following a  meal.  . midodrine (PROAMATINE) 2.5 MG tablet Take 2.5 mg by mouth 3 (three) times daily.  . montelukast (SINGULAIR) 10 MG tablet Take 10 mg by mouth at bedtime.  Marland Kitchen omeprazole (PRILOSEC) 20 MG capsule Take 20 mg by mouth 2 (two) times daily.   . ramipril (ALTACE) 5 MG capsule Take 1 capsule (5 mg total) by mouth daily.  Marland Kitchen triamterene-hydrochlorothiazide (MAXZIDE-25) 37.5-25 MG per tablet Take 1 tablet by mouth daily.   Marland Kitchen VAGIFEM 10 MCG TABS vaginal tablet Place 1 tablet vaginally once a week.   . [DISCONTINUED] LINZESS 145 MCG CAPS capsule Take 145 mcg by mouth daily.  . [facility] BISACODYL 10 MG suppository Take 1 tablet by mouth as needed.  . Linaclotide (LINZESS) 290 MCG CAPS capsule Take 1 capsule (290 mcg total) by mouth  daily.  . potassium chloride (K-DUR,KLOR-CON) 10 MEQ tablet Take 10 mEq by mouth once. Take one tablet every time you take lasix  . [DISCONTINUED] anastrozole (ARIMIDEX) 1 MG tablet Take 1 mg by mouth daily.  . [DISCONTINUED] Artificial Tear Solution (SOOTHE XP) SOLN Place 1 drop into both eyes every morning.   . [DISCONTINUED] gentamicin (GARAMYCIN) 0.3 % ophthalmic solution   . [DISCONTINUED] levothyroxine (SYNTHROID, LEVOTHROID) 112 MCG tablet Take 112 mcg by mouth daily.  . [DISCONTINUED] temazepam (RESTORIL) 30 MG capsule Take 30 mg by mouth at bedtime as needed for  sleep.  . [DISCONTINUED] traMADol (ULTRAM) 50 MG tablet Take 50 mg by mouth. As needed  . [DISCONTINUED] vitamin B-12 (CYANOCOBALAMIN) 250 MCG tablet Take 250 mcg by mouth daily.   No facility-administered encounter medications on file as of 07/15/2015.    Allergies as of 07/15/2015 - Review Complete 03/31/2015  Allergen Reaction Noted  . Codeine Hives 04/27/2009  . Sulfonamide derivatives Hives 04/27/2009    Past Medical History  Diagnosis Date  . Esophageal reflux   . Hiatal hernia   . Unspecified gastritis and gastroduodenitis without mention of hemorrhage   . Esophageal stricture   . Type II or unspecified type diabetes mellitus without mention of complication, not stated as uncontrolled   . Fibromyalgia   . Hypertension   . Anxiety   . Depression   . Sleep apnea     CPAP  . Kidney stones   . Hypercholesterolemia   . IBS (irritable bowel syndrome)   . Cataract   . Anemia   . Coronary heart disease   . Pneumonia     recently, just finished ABO  . Carotid artery plaque     bilat  . Anginal pain ([place])   . Arthritis   . Dizziness   . PONV (postoperative nausea and vomiting)   . Breast cancer ([place])   . Thyroid disease 2002  . Hypothyroidism   . Pneumonia   . Gallstones   . Diverticulosis   . Colon polyps     adenomatous    Past Surgical History  Procedure Laterality Date  . Cardiac catheterization    . Total abdominal hysterectomy    . Appendectomy    . Cystocele repair    . Rectocele repair    . Thyroidectomy    . Breast lumpectomy      left  . Cervical fusion    . Cholecystectomy    . Tonsillectomy    . Cesarean section    . Cataract extraction w/phaco Left 03/30/2013    Procedure: CATARACT EXTRACTION PHACO AND INTRAOCULAR LENS PLACEMENT (IOC);  Surgeon: Tonny Branch, MD;  Location: [facility];  Service: Ophthalmology;  Laterality: Left;  CDE:19.28  . Cataract extraction w/phaco Right 04/09/2013    Procedure: CATARACT EXTRACTION PHACO AND INTRAOCULAR LENS  PLACEMENT (IOC);  Surgeon: Tonny Branch, MD;  Location: [facility];  Service: Ophthalmology;  Laterality: Right;  CDE: 15.44  . Eye surgery Left 03-30-13    Cataract  . Eye surgery Right 04-09-13    Cataract  . Dilation and curettage of uterus      Family History  Problem Relation Age of Onset  . Esophageal cancer Maternal Uncle   . Ovarian cancer Maternal Aunt   . Colon cancer Maternal Uncle   . Lung cancer Maternal Uncle   . Sarcoidosis Mother   . Diabetes Mother   . Cancer Mother     sarcoma -  Right arm amputation  . Hypertension  Mother   . Lung cancer Father   . Diabetes Father   . Heart disease Father     Before age 20  . Urolithiasis Father   . Cancer Father     Lung  . Hyperlipidemia Father   . Hypertension Father   . Diabetes Maternal Uncle   . Diabetes Maternal Aunt   . Colon polyps Maternal Uncle   . Heart disease Paternal Grandmother     Social History   Social History  . Marital Status: Married    Spouse Name: N/A  . Number of Children: 4  . Years of Education: N/A   Occupational History  . Retired Marine scientist    Social History Main Topics  . Smoking status: Never Smoker   . Smokeless tobacco: Never Used  . Alcohol Use: No  . Drug Use: No  . Sexual Activity: Not on file   Other Topics Concern  . Not on file   Social History Narrative      Review of systems: Gen: Denies any fever, chills, or fatigue CV: Denies chest pain, angina, palpitations, syncope Resp: Denies difficulty breathing, cough, or wheezing GI: as per HPI MS: Denies joint swelling. Denies muscle weakness, cramps, atrophy.  Derm: Denies rash, itching, dry skin, hives, moles, warts, or unhealing ulcers.  Psych: Denies depression, anxiety, memory loss, suicidal ideation, hallucinations, paranoia, and confusion. Heme: Denies bruising, bleeding, and enlarged lymph nodes. Neuro:  Denies any weakness, dizziness Endo:  Denies any problems thyroid, adrenal function. All other ROS negative or as  per HPI   Physical Exam: BP 94/50 mmHg  Pulse 76  Ht 5\' 4"  (1.626 m)  Wt 192 lb (87.091 kg)  BMI 32.94 kg/m2 Constitutional: WDWN NAD Eyes: anicteric Mouth: oral and posterior pharynx free of lesions Neck: supple, no mass or thyromegaly Lungs: clear to auscultation bilaterally Cardiovascular: S1S2 with regular rate and rhythm, no rubs murmurs or gallops Abdomen: soft, nontender, mildly distended, no masses or organomegaly, normal bowel sounds, abdominal wall hernia Extremities: no lower extremity edema  Skin: no rash Neuro: alert and oriented x 3 Psych: normal mood and affect Rectal exam: Skin normal, no external hemorrhoids or fissures, Weak external anal sphincter, on bear down paradoxical contraction of the sphincter and no pelvic descent.   Data Reviewed: Colonoscopy 2013: Left sided diverticulosis and internal hemorrhoids, no polyps or cancer detected. Adequate prep. EGD 2013: Hiatal hernia, distal esophageal stricture dilated by 46 French Maloney dilator, moderate gastritis in gastric body and antrum   Assessment and Plan/Recommendations: 74 year old lady with history of total abdominal hysterectomy status post repair of cystocele, rectocele and enterocele, and also s/p multiple abdominal surgeries and adhesiolysis here with complaints of chronic constipation and intermittent fecal incontinence. She also has intermittent bloating and IBS On rectal exam she has features suggestive of dyssynergia defection, will schedule for anorectal manometry and pelvic floor physical therapy based on the findings. She likely has a component of slow transit and outlet dysfunction for the chronic constipation.  Linclotide 216mcg daily Advised patient to minimize fiber intake as may worsen bloating Will hold off probiotics for now She has h/o B12 deficiency in the past and was receiving b12 injection, will check CBC, BMP, iron panel, B12 and folate level Return in 2-3 months  K. Denzil Magnuson  , MD [phone] Mon-Fri 8a-5p [phone] after 5p, weekends, holidays

## 2015-07-15 NOTE — Patient Instructions (Addendum)
Your physician has requested that you go to the basement for lab work before leaving today  We have sent the following medications to your pharmacy for you to pick up at your convenience: Linzess 290mg   You have been scheduled for an Ano-rectal Manometry at [facility] on 08-05-15 at 12:30pm. Please arrive an hour  prior to your appointment for registration. Please buy a Fleet enema at your local drug store and use the night before the manometry. You are able to eat and drink the day of the manometry.  Please call our office if you have any question or concerns.  Please follow up on 09-15-15 at 9:00am

## 2015-07-18 ENCOUNTER — Encounter: Payer: Self-pay | Admitting: Vascular Surgery

## 2015-07-19 ENCOUNTER — Other Ambulatory Visit: Payer: Self-pay | Admitting: Surgery

## 2015-07-19 ENCOUNTER — Ambulatory Visit (INDEPENDENT_AMBULATORY_CARE_PROVIDER_SITE_OTHER): Payer: Medicare Other | Admitting: Family

## 2015-07-19 ENCOUNTER — Other Ambulatory Visit: Payer: Self-pay | Admitting: Family

## 2015-07-19 ENCOUNTER — Encounter: Payer: Self-pay | Admitting: Family

## 2015-07-19 ENCOUNTER — Ambulatory Visit (HOSPITAL_COMMUNITY)
Admission: RE | Admit: 2015-07-19 | Discharge: 2015-07-19 | Disposition: A | Discharge status: Home / Self Care | Payer: Medicare Other | Source: Ambulatory Visit | Attending: Family | Admitting: Family

## 2015-07-19 VITALS — BP 120/71 | HR 74 | Ht 64.0 in | Wt 188.3 lb

## 2015-07-19 DIAGNOSIS — I119 Hypertensive heart disease without heart failure: Secondary | ICD-10-CM | POA: Diagnosis not present

## 2015-07-19 DIAGNOSIS — I771 Stricture of artery: Secondary | ICD-10-CM | POA: Diagnosis not present

## 2015-07-19 DIAGNOSIS — E119 Type 2 diabetes mellitus without complications: Secondary | ICD-10-CM | POA: Insufficient documentation

## 2015-07-19 DIAGNOSIS — I6523 Occlusion and stenosis of bilateral carotid arteries: Secondary | ICD-10-CM

## 2015-07-19 NOTE — Patient Instructions (Signed)
Stroke Prevention Some medical conditions and behaviors are associated with an increased chance of having a stroke. You may prevent a stroke by making healthy choices and managing medical conditions. HOW CAN I REDUCE MY RISK OF HAVING A STROKE?   Stay physically active. Get at least 30 minutes of activity on most or all days.  Do not smoke. It may also be helpful to avoid exposure to secondhand smoke.  Limit alcohol use. Moderate alcohol use is considered to be:  No more than 2 drinks per day for men.  No more than 1 drink per day for nonpregnant women.  Eat healthy foods. This involves:  Eating 5 or more servings of fruits and vegetables a day.  Making dietary changes that address high blood pressure (hypertension), high cholesterol, diabetes, or obesity.  Manage your cholesterol levels.  Making food choices that are high in fiber and low in saturated fat, trans fat, and cholesterol may control cholesterol levels.  Take any prescribed medicines to control cholesterol as directed by your health care provider.  Manage your diabetes.  Controlling your carbohydrate and sugar intake is recommended to manage diabetes.  Take any prescribed medicines to control diabetes as directed by your health care provider.  Control your hypertension.  Making food choices that are low in salt (sodium), saturated fat, trans fat, and cholesterol is recommended to manage hypertension.  Ask your health care provider if you need treatment to lower your blood pressure. Take any prescribed medicines to control hypertension as directed by your health care provider.  If you are 18-39 years of age, have your blood pressure checked every 3-5 years. If you are 40 years of age or older, have your blood pressure checked every year.  Maintain a healthy weight.  Reducing calorie intake and making food choices that are low in sodium, saturated fat, trans fat, and cholesterol are recommended to manage  weight.  Stop drug abuse.  Avoid taking birth control pills.  Talk to your health care provider about the risks of taking birth control pills if you are over 35 years old, smoke, get migraines, or have ever had a blood clot.  Get evaluated for sleep disorders (sleep apnea).  Talk to your health care provider about getting a sleep evaluation if you snore a lot or have excessive sleepiness.  Take medicines only as directed by your health care provider.  For some people, aspirin or blood thinners (anticoagulants) are helpful in reducing the risk of forming abnormal blood clots that can lead to stroke. If you have the irregular heart rhythm of atrial fibrillation, you should be on a blood thinner unless there is a good reason you cannot take them.  Understand all your medicine instructions.  Make sure that other conditions (such as anemia or atherosclerosis) are addressed. SEEK IMMEDIATE MEDICAL CARE IF:   You have sudden weakness or numbness of the face, arm, or leg, especially on one side of the body.  Your face or eyelid droops to one side.  You have sudden confusion.  You have trouble speaking (aphasia) or understanding.  You have sudden trouble seeing in one or both eyes.  You have sudden trouble walking.  You have dizziness.  You have a loss of balance or coordination.  You have a sudden, severe headache with no known cause.  You have new chest pain or an irregular heartbeat. Any of these symptoms may represent a serious problem that is an emergency. Do not wait to see if the symptoms will   go away. Get medical help at once. Call your local emergency services (911 in [place] Do not drive yourself to the hospital.   This information is not intended to replace advice given to you by your health care provider. Make sure you discuss any questions you have with your health care provider.   Document Released: 11/01/2004 Document Revised: 10/15/2014 Document Reviewed:  03/27/2013 Elsevier Interactive Patient Education 2016 Elsevier Inc.  

## 2015-07-19 NOTE — Progress Notes (Signed)
Established Carotid Patient   History of Present Illness  Jeanne Kim is a 74 y.o. female patient of Dr. Donnetta Hutching who returns for follow up of her mild to moderate left carotid stenosis with elevated velocities related to tortuosity of the vessel.   Patient has not had previous carotid artery intervention.  The patient denies any history of TIA or stroke symptoms.Specifically the patient denies any history of amaurosis fugax or monocular blindness, unilateral facial drooping, hemiplegia, or receptive or expressive aphasia.  Pt states she has plaque and kinking in her coronary arteries, she is unsure if she has had an MI.  Pt reports New Medical or Surgical History: her Linzess dose was increased to help her IBS. Her mother passed away recently.   Has chronic c-spine issues, seeing ortho for this, has intermittent tingling, numbness, weakness in her left arm. She also has known lumbar disc disease which affects her left leg more so than right. She has fibromyalgia, denies non healing wounds. She has trouble with her balance.   Pt Diabetic: Yes, pt states uncontrolled, states last A1C was 8.4, she sees an endocrinologist at [facility] in [place]. She was recently started on Victoza. Pt admits to eating too many sweets. Pt smoker: non-smoker  Pt meds include: Statin : Yes ASA: Yes Other anticoagulants/antiplatelets: no    Past Medical History  Diagnosis Date  . Esophageal reflux   . Hiatal hernia   . Unspecified gastritis and gastroduodenitis without mention of hemorrhage   . Esophageal stricture   . Type II or unspecified type diabetes mellitus without mention of complication, not stated as uncontrolled   . Fibromyalgia   . Hypertension   . Anxiety   . Depression   . Sleep apnea     CPAP  . Kidney stones   . Hypercholesterolemia   . IBS (irritable bowel syndrome)   . Cataract   . Anemia   . Coronary heart disease   . Pneumonia     recently, just finished ABO  .  Carotid artery plaque     bilat  . Anginal pain ([place])   . Arthritis   . Dizziness   . PONV (postoperative nausea and vomiting)   . Breast cancer ([place])   . Thyroid disease 2002  . Hypothyroidism   . Pneumonia   . Gallstones   . Diverticulosis   . Colon polyps     adenomatous    Social History Social History  Substance Use Topics  . Smoking status: Never Smoker   . Smokeless tobacco: Never Used  . Alcohol Use: No    Family History Family History  Problem Relation Age of Onset  . Esophageal cancer Maternal Uncle   . Ovarian cancer Maternal Aunt   . Colon cancer Maternal Uncle   . Lung cancer Maternal Uncle   . Sarcoidosis Mother   . Diabetes Mother   . Cancer Mother     sarcoma -  Right arm amputation  . Hypertension Mother   . Lung cancer Father   . Diabetes Father   . Heart disease Father     Before age 82  . Urolithiasis Father   . Cancer Father     Lung  . Hyperlipidemia Father   . Hypertension Father   . Diabetes Maternal Uncle   . Diabetes Maternal Aunt   . Colon polyps Maternal Uncle   . Heart disease Paternal Grandmother     Surgical History Past Surgical History  Procedure Laterality Date  .  Cardiac catheterization    . Total abdominal hysterectomy    . Appendectomy    . Cystocele repair    . Rectocele repair    . Thyroidectomy    . Breast lumpectomy      left  . Cervical fusion    . Cholecystectomy    . Tonsillectomy    . Cesarean section    . Cataract extraction w/phaco Left 03/30/2013    Procedure: CATARACT EXTRACTION PHACO AND INTRAOCULAR LENS PLACEMENT (IOC);  Surgeon: Tonny Branch, MD;  Location: [facility];  Service: Ophthalmology;  Laterality: Left;  CDE:19.28  . Cataract extraction w/phaco Right 04/09/2013    Procedure: CATARACT EXTRACTION PHACO AND INTRAOCULAR LENS PLACEMENT (IOC);  Surgeon: Tonny Branch, MD;  Location: [facility];  Service: Ophthalmology;  Laterality: Right;  CDE: 15.44  . Eye surgery Left 03-30-13    Cataract  . Eye surgery  Right 04-09-13    Cataract  . Dilation and curettage of uterus      Allergies  Allergen Reactions  . Codeine Hives  . Sulfonamide Derivatives Hives    Current Outpatient Prescriptions  Medication Sig Dispense Refill  . aspirin 325 MG tablet Take 325 mg by mouth daily.    Marland Kitchen atorvastatin (LIPITOR) 20 MG tablet Take 1 tablet (20 mg total) by mouth daily. 90 tablet 3  . BELSOMRA 10 MG TABS Take 1 tablet by mouth daily.   0  . calcium-vitamin D (OSCAL WITH D) 500-200 MG-UNIT per tablet Take 1 tablet by mouth 2 (two) times daily.    . Coenzyme Q10 (CO Q-10) 100 MG CAPS Take 100 mg by mouth daily.    . CVS BISACODYL 10 MG suppository Take 1 tablet by mouth as needed.    . CycloSPORINE (RESTASIS OP) Apply 1 drop to eye daily.    . diclofenac (VOLTAREN) 75 MG EC tablet Take by mouth 2 (two) times daily.     . DULoxetine (CYMBALTA) 60 MG capsule Take 60 mg by mouth daily.    . fexofenadine (ALLEGRA) 30 MG tablet Take 30 mg by mouth daily.    . fluticasone (FLONASE) 50 MCG/ACT nasal spray Place 2 sprays into the nose daily.    . furosemide (LASIX) 40 MG tablet Take 1 pill daily for 3 days then as needed for swelling 30 tablet 6  . gabapentin (NEURONTIN) 300 MG capsule Take 300 mg by mouth at bedtime.    Marland Kitchen glipiZIDE (GLUCOTROL) 10 MG tablet Take 10 mg by mouth daily before breakfast.    . levothyroxine (SYNTHROID, LEVOTHROID) 125 MCG tablet Take 1 tablet by mouth daily.    . Linaclotide (LINZESS) 290 MCG CAPS capsule Take 1 capsule (290 mcg total) by mouth daily. 30 capsule 6  . Liraglutide 18 MG/3ML SOPN Inject 0.6 mg into the skin daily.    . metFORMIN (GLUCOPHAGE) 1000 MG tablet Take 1,000 mg by mouth 2 (two) times daily with a meal.    . metoprolol succinate (TOPROL-XL) 25 MG 24 hr tablet Take 1 tablet by mouth at  bedtime. Take  with or  immediately  following a  meal. 90 tablet 0  . midodrine (PROAMATINE) 2.5 MG tablet Take 2.5 mg by mouth 3 (three) times daily.  0  . montelukast (SINGULAIR)  10 MG tablet Take 10 mg by mouth at bedtime.    Marland Kitchen omeprazole (PRILOSEC) 20 MG capsule Take 20 mg by mouth 2 (two) times daily.     . potassium chloride (K-DUR,KLOR-CON) 10 MEQ tablet Take 10 mEq  by mouth once. Take one tablet every time you take lasix    . ramipril (ALTACE) 5 MG capsule Take 1 capsule (5 mg total) by mouth daily. 90 capsule 3  . triamterene-hydrochlorothiazide (MAXZIDE-25) 37.5-25 MG per tablet Take 1 tablet by mouth daily.     Marland Kitchen VAGIFEM 10 MCG TABS vaginal tablet Place 1 tablet vaginally once a week.   0  . Insulin Lispro Prot & Lispro (HUMALOG MIX 75/25 KWIKPEN) (75-25) 100 UNIT/ML Kwikpen Inject 25 Units into the skin daily. 25 units in the morning and 20 units nightly     No current facility-administered medications for this visit.    Review of Systems : See HPI for pertinent positives and negatives.  Physical Examination  Filed Vitals:   07/19/15 1025  BP: 120/71  Pulse: 74  Height: 5\' 4"  (1.626 m)  Weight: 188 lb 4.8 oz (85.412 kg)  SpO2: 96%   Body mass index is 32.31 kg/(m^2).  General: WDWN obese female in NAD GAIT: slow, deliberate Eyes: PERRLA Pulmonary: Non-labored, CTAB, no rales, no rhonchi, & no wheezing.  Cardiac: regular rhythm, no detected murmur.  VASCULAR EXAM Carotid Bruits Left Right   Negative Negative    Radial pulses are 2+ palpable and equal.      LE Pulses LEFT RIGHT   POPLITEAL not palpable  not palpable   POSTERIOR TIBIAL  not palpable  not  palpable    DORSALIS PEDIS  ANTERIOR TIBIAL palpable  palpable     Gastrointestinal: soft, nontender, BS WNL, no r/g,no palpable masses.  Musculoskeletal: No muscle atrophy/wasting. M/S 5/5 in upper extremities, 4/5 in lower extremities, Extremities without ischemic changes.  Neurologic: A&O X 3; Appropriate  Affect ; SENSATION equal and symmetric except slightly diminished in left fingers and toes compared to right;  Speech is normal CN 2-12 intact, Motor exam as listed above.           Non-Invasive Vascular Imaging CAROTID DUPLEX 07/19/2015   CEREBROVASCULAR DUPLEX EVALUATION    INDICATION: Carotid artery stenosis    PREVIOUS INTERVENTION(S): NA    DUPLEX EXAM:     RIGHT  LEFT  Peak Systolic Velocities (cm/s) End Diastolic Velocities (cm/s) Plaque LOCATION Peak Systolic Velocities (cm/s) End Diastolic Velocities (cm/s) Plaque  66 11  CCA PROXIMAL 108 16   83 19  CCA MID 86 18   82 17 HT CCA DISTAL 76 17 TH  70 9  ECA 83 15   71 16  ICA PROXIMAL 75 21 HT  73 23  ICA MID 107 28   113 24  ICA DISTAL 89 25     0.86 ICA / CCA Ratio (PSV) 0.87  Antegrade Vertebral Flow Antegrade  NA Brachial Systolic Pressure (mmHg) NA  NA Brachial Artery Waveforms NA    Plaque Morphology:  HM = Homogeneous, HT = Heterogeneous, CP = Calcific Plaque, SP = Smooth Plaque, IP = Irregular Plaque     ADDITIONAL FINDINGS: Right subclavian artery PSV152cm/sec ;Left subclavian artery PSV139cm/sec. Left internal carotid artery vessel tortuousity/kink noted at the mid segment.    IMPRESSION: Bilateral internal carotid artery stenosis of less than 40%.    Compared to the previous exam:  Essentially unchanged since previous study on 04/29/2014.     Assessment: Jeanne Kim is a 74 y.o. female who has no history of stroke or TIA. Today's carotid duplex suggests minimal bilateral ICA stenoses. Left internal carotid artery vessel tortuousity/kink noted at the mid segment.  Essentially unchanged since previous study  on 04/29/2014. Vertebral arteries are antegrade. She has mild bilateral subclavian stenosis which is not severe enough to cause dizziness.    Plan: Follow-up in 1 year with Carotid Duplex scan.   I discussed in depth with the patient the nature of atherosclerosis, and emphasized the  importance of maximal medical management including strict control of blood pressure, blood glucose, and lipid levels, obtaining regular exercise, and continued cessation of smoking.  The patient is aware that without maximal medical management the underlying atherosclerotic disease process will progress, limiting the benefit of any interventions. The patient was given information about stroke prevention and what symptoms should prompt the patient to seek immediate medical care. Thank you for allowing [place] to participate in this patient's care.  Clemon Chambers, RN, MSN, FNP-C [facility] Office: [phone]  Clinic Physician: Early  07/19/2015 10:50 AM

## 2015-07-20 ENCOUNTER — Other Ambulatory Visit: Payer: Self-pay

## 2015-07-20 MED ORDER — NEEDLES & SYRINGES MISC: Status: DC

## 2015-07-20 MED ORDER — CYANOCOBALAMIN 1000 MCG/ML IJ SOLN: 1000.0000 ug | Freq: Once | INTRAMUSCULAR | Status: DC

## 2015-07-20 NOTE — Addendum Note (Signed)
Addended by: Dorthula Rue L on: 07/20/2015 07:54 AM   Modules accepted: Orders

## 2015-08-05 ENCOUNTER — Encounter (HOSPITAL_COMMUNITY)
Admission: RE | Disposition: A | Discharge status: Home / Self Care | Payer: Self-pay | Source: Ambulatory Visit | Attending: Gastroenterology

## 2015-08-05 ENCOUNTER — Ambulatory Visit (HOSPITAL_COMMUNITY)
Admission: RE | Admit: 2015-08-05 | Discharge: 2015-08-05 | Disposition: A | Discharge status: Home / Self Care | Payer: Medicare Other | Source: Ambulatory Visit | Attending: Gastroenterology | Admitting: Gastroenterology

## 2015-08-05 DIAGNOSIS — R159 Full incontinence of feces: Secondary | ICD-10-CM | POA: Diagnosis not present

## 2015-08-05 DIAGNOSIS — K5900 Constipation, unspecified: Secondary | ICD-10-CM | POA: Insufficient documentation

## 2015-08-05 HISTORY — PX: ANAL RECTAL MANOMETRY: SHX6358

## 2015-08-05 SURGERY — MANOMETRY, ANORECTAL

## 2015-08-08 ENCOUNTER — Encounter (HOSPITAL_COMMUNITY): Payer: Self-pay | Admitting: Gastroenterology

## 2015-09-15 ENCOUNTER — Ambulatory Visit: Payer: Medicare Other | Admitting: Gastroenterology

## 2015-09-29 DIAGNOSIS — R159 Full incontinence of feces: Secondary | ICD-10-CM | POA: Insufficient documentation

## 2015-10-11 DIAGNOSIS — I10 Essential (primary) hypertension: Secondary | ICD-10-CM | POA: Diagnosis not present

## 2015-10-11 DIAGNOSIS — E1121 Type 2 diabetes mellitus with diabetic nephropathy: Secondary | ICD-10-CM | POA: Diagnosis not present

## 2015-10-11 DIAGNOSIS — Z6827 Body mass index (BMI) 27.0-27.9, adult: Secondary | ICD-10-CM | POA: Diagnosis not present

## 2015-10-19 ENCOUNTER — Other Ambulatory Visit: Payer: Self-pay | Admitting: Internal Medicine

## 2015-10-19 DIAGNOSIS — M545 Low back pain, unspecified: Secondary | ICD-10-CM

## 2015-10-19 DIAGNOSIS — G8929 Other chronic pain: Secondary | ICD-10-CM

## 2015-10-25 ENCOUNTER — Other Ambulatory Visit: Payer: Medicare Other

## 2015-10-27 ENCOUNTER — Other Ambulatory Visit: Payer: Medicare Other

## 2015-11-22 ENCOUNTER — Encounter: Payer: Self-pay | Admitting: Gastroenterology

## 2015-11-22 ENCOUNTER — Ambulatory Visit (INDEPENDENT_AMBULATORY_CARE_PROVIDER_SITE_OTHER): Payer: Medicare HMO | Admitting: Gastroenterology

## 2015-11-22 VITALS — BP 100/60 | HR 66 | Ht 64.0 in | Wt 171.0 lb

## 2015-11-22 DIAGNOSIS — R159 Full incontinence of feces: Secondary | ICD-10-CM | POA: Diagnosis not present

## 2015-11-22 DIAGNOSIS — K5902 Outlet dysfunction constipation: Secondary | ICD-10-CM | POA: Diagnosis not present

## 2015-11-22 MED ORDER — LINACLOTIDE 290 MCG PO CAPS: 290.0000 ug | ORAL_CAPSULE | Freq: Every day | ORAL | Status: DC

## 2015-11-22 NOTE — Progress Notes (Signed)
CELICA PURINTON    [id number]    [birth year]  Primary Care Physician:XAJE [name], MD  Referring Physician: Neale Burly, MD [address] [place], [place] [id number]  Chief complaint:  Constipation, IBS, bloating HPI: 75 year old lady with history of hysterectomy status post repair of cystocele, rectocele and enterocele, s/p multiple abdominal surgeries and adhesiolysis here for follow up visit for chronic constipation and intermittent fecal incontinence. Patient had hysterectomy in 1999 and subsequently had repair of cystocele rectocele and enterocele, she has been having constipation bloating nausea for the past 15-20 years. She currently takes laxatives every day and has bowel movement every 2-3 days, she has fecal urgency with every bowel movement and intermittent incontinence, she also has gas incontinence. She feels bloated after every meal. Anorectal manometry October 2016 showed findings suggestive of dyssynergia defecation with incomplete anal sphincter relaxation though she was able to expel the balloon.  Denies any nausea, vomiting, abdominal pain, melena or bright red blood per rectum. Last colonoscopy in 2013 by Dr. Sharlett Iles showed diverticulosis and internal hemorrhoids. No polyps but she has family history of colon cancer and personal history of colon adenomas   Outpatient Encounter Prescriptions as of 11/22/2015  Medication Sig  . aspirin 325 MG tablet Take 325 mg by mouth daily.  Marland Kitchen atorvastatin (LIPITOR) 20 MG tablet Take 1 tablet (20 mg total) by mouth daily.  . BELSOMRA 10 MG TABS Take 1 tablet by mouth daily.   . calcium-vitamin D (OSCAL WITH D) 500-200 MG-UNIT per tablet Take 1 tablet by mouth 2 (two) times daily.  . Coenzyme Q10 (CO Q-10) 100 MG CAPS Take 100 mg by mouth daily.  . CVS BISACODYL 10 MG suppository Take 1 tablet by mouth as needed.  . cyanocobalamin (,VITAMIN B-12,) 1000 MCG/ML injection Inject 1 mL (1,000 mcg total) into the muscle once.    . CycloSPORINE (RESTASIS OP) Apply 1 drop to eye daily as needed.   . diclofenac (VOLTAREN) 75 MG EC tablet Take by mouth 2 (two) times daily.   . DULoxetine (CYMBALTA) 60 MG capsule Take 60 mg by mouth daily.  . fexofenadine (ALLEGRA) 30 MG tablet Take 30 mg by mouth daily.  . fluticasone (FLONASE) 50 MCG/ACT nasal spray Place 2 sprays into the nose daily.  . furosemide (LASIX) 40 MG tablet Take 1 pill daily for 3 days then as needed for swelling  . gabapentin (NEURONTIN) 300 MG capsule Take 300 mg by mouth at bedtime.  Marland Kitchen glipiZIDE (GLUCOTROL) 10 MG tablet Take 10 mg by mouth daily before breakfast.  . LANTUS SOLOSTAR 100 UNIT/ML Solostar Pen Inject 20 Units into the skin at bedtime.  Marland Kitchen levothyroxine (SYNTHROID, LEVOTHROID) 125 MCG tablet Take 1 tablet by mouth daily.  . Linaclotide (LINZESS) 290 MCG CAPS capsule Take 1 capsule (290 mcg total) by mouth daily.  . Liraglutide 18 MG/3ML SOPN Inject 1.2 mg into the skin daily.   . metFORMIN (GLUCOPHAGE) 1000 MG tablet Take 1,000 mg by mouth 2 (two) times daily with a meal.  . metoprolol succinate (TOPROL-XL) 25 MG 24 hr tablet Take 1 tablet by mouth at  bedtime. Take  with or  immediately  following a  meal.  . midodrine (PROAMATINE) 2.5 MG tablet Take 2.5 mg by mouth 3 (three) times daily.  . montelukast (SINGULAIR) 10 MG tablet Take 10 mg by mouth at bedtime.  . Needles & Syringes MISC 25G 5/8 inch needle on 3 ml syringe-Use  once monthly for B12 injections  . omeprazole (PRILOSEC) 20 MG capsule Take 20 mg by mouth 2 (two) times daily.   . potassium chloride (K-DUR,KLOR-CON) 10 MEQ tablet Take 10 mEq by mouth once. Take one tablet every time you take lasix  . ramipril (ALTACE) 5 MG capsule Take 1 capsule (5 mg total) by mouth daily.  Marland Kitchen triamterene-hydrochlorothiazide (MAXZIDE-25) 37.5-25 MG per tablet Take 1 tablet by mouth daily.   Marland Kitchen VAGIFEM 10 MCG TABS vaginal tablet Place 1 tablet vaginally once a week.   . [DISCONTINUED] Linaclotide  (LINZESS) 290 MCG CAPS capsule Take 1 capsule (290 mcg total) by mouth daily.  . [DISCONTINUED] Insulin Lispro Prot & Lispro (HUMALOG MIX 75/25 KWIKPEN) (75-25) 100 UNIT/ML Kwikpen Inject 25 Units into the skin daily. 25 units in the morning and 20 units nightly   No facility-administered encounter medications on file as of 11/22/2015.    Allergies as of 11/22/2015 - Review Complete 11/22/2015  Allergen Reaction Noted  . Codeine Hives 04/27/2009  . Sulfonamide derivatives Hives 04/27/2009    Past Medical History  Diagnosis Date  . Esophageal reflux   . Hiatal hernia   . Unspecified gastritis and gastroduodenitis without mention of hemorrhage   . Esophageal stricture   . Type II or unspecified type diabetes mellitus without mention of complication, not stated as uncontrolled   . Fibromyalgia   . Hypertension   . Anxiety   . Depression   . Sleep apnea     CPAP  . Kidney stones   . Hypercholesterolemia   . IBS (irritable bowel syndrome)   . Cataract   . Anemia   . Coronary heart disease   . Pneumonia     recently, just finished ABO  . Carotid artery plaque     bilat  . Anginal pain ([place])   . Arthritis   . Dizziness   . PONV (postoperative nausea and vomiting)   . Breast cancer ([place])   . Thyroid disease 2002  . Hypothyroidism   . Pneumonia   . Gallstones   . Diverticulosis   . Colon polyps     adenomatous    Past Surgical History  Procedure Laterality Date  . Cardiac catheterization    . Total abdominal hysterectomy    . Appendectomy    . Cystocele repair    . Rectocele repair    . Thyroidectomy    . Breast lumpectomy      left  . Cervical fusion    . Cholecystectomy    . Tonsillectomy    . Cesarean section    . Cataract extraction w/phaco Left 03/30/2013    Procedure: CATARACT EXTRACTION PHACO AND INTRAOCULAR LENS PLACEMENT (IOC);  Surgeon: Tonny Branch, MD;  Location: [facility];  Service: Ophthalmology;  Laterality: Left;  CDE:19.28  . Cataract extraction  w/phaco Right 04/09/2013    Procedure: CATARACT EXTRACTION PHACO AND INTRAOCULAR LENS PLACEMENT (IOC);  Surgeon: Tonny Branch, MD;  Location: [facility];  Service: Ophthalmology;  Laterality: Right;  CDE: 15.44  . Eye surgery Left 03-30-13    Cataract  . Eye surgery Right 04-09-13    Cataract  . Dilation and curettage of uterus    . Anal rectal manometry N/A 08/05/2015    Procedure: ANO RECTAL MANOMETRY;  Surgeon: Mauri Pole, MD;  Location: [facility];  Service: Endoscopy;  Laterality: N/A;    Family History  Problem Relation Age of Onset  . Esophageal cancer Maternal Uncle   . Ovarian cancer Maternal Aunt   .  Colon cancer Maternal Uncle   . Lung cancer Maternal Uncle   . Sarcoidosis Mother   . Diabetes Mother   . Cancer Mother     sarcoma -  Right arm amputation  . Hypertension Mother   . Lung cancer Father   . Diabetes Father   . Heart disease Father     Before age 45  . Urolithiasis Father   . Cancer Father     Lung  . Hyperlipidemia Father   . Hypertension Father   . Diabetes Maternal Uncle   . Diabetes Maternal Aunt   . Colon polyps Maternal Uncle   . Heart disease Paternal Grandmother     Social History   Social History  . Marital Status: Married    Spouse Name: N/A  . Number of Children: 4  . Years of Education: N/A   Occupational History  . Retired Marine scientist    Social History Main Topics  . Smoking status: Never Smoker   . Smokeless tobacco: Never Used  . Alcohol Use: No  . Drug Use: No  . Sexual Activity: Not on file   Other Topics Concern  . Not on file   Social History Narrative      Review of systems: Review of Systems  Constitutional: Negative for fever and chills.  HENT: Positive  for sinus problem and seasonal allergies  Eyes: Negative for blurred vision.  Respiratory: Negative for cough, shortness of breath and wheezing. Cardiovascular: Negative for chest pain and palpitations.  Gastrointestinal: as per HPI Genitourinary: Negative for  dysuria, urgency, frequency and hematuria. Positive for urinary incontinence Musculoskeletal: Positive for myalgias, back pain and joint pain.  Skin: Negative for itching and rash.  Neurological: Negative for dizziness, tremors, focal weakness, seizures and loss of consciousness.  Endo/Heme/Allergies: Negative for environmental allergies.  Psychiatric/Behavioral: Negative for depression, suicidal ideas and hallucinations.  All other systems reviewed and are negative.   Physical Exam: Filed Vitals:   11/22/15 1406  BP: 100/60  Pulse: 66   Gen:      No acute distress HEENT:  EOMI, sclera anicteric Neck:     No masses; no thyromegaly Lungs:    Clear to auscultation bilaterally; normal respiratory effort CV:         Regular rate and rhythm; no murmurs Abd:      + bowel sounds; soft, non-tender; no palpable masses, no distension Ext:    No edema; adequate peripheral perfusion Skin:      Warm and dry; no rash Neuro: alert and oriented x 3 Psych: normal mood and affect  Data Reviewed:  Anorectal manometry 07/2015 Findings suggestive of dyssynergia defecation with incomplete relaxation of anal sphincter   Assessment and Plan/Recommendations: 75 year old female status post hysterectomy with with cystocele, rectocele and enterocele status post repair with chronic constipation with outlet dysfunction, fecal and urinary incontinence here for follow-up visit Cont Linzess to 290 g daily We'll send referral to Pelvic floor PT Return in 6 months Due for recall colonoscopy in March 2018  K. Denzil Magnuson , MD [phone] Mon-Fri 8a-5p [phone] after 5p, weekends, holidays

## 2015-11-22 NOTE — Patient Instructions (Signed)
We will refill Linzess for you today We will refer you to [facility] for Pelvic Floor Rehabilitation, You will be contacted with that appointment

## 2015-11-23 ENCOUNTER — Telehealth: Payer: Self-pay | Admitting: *Deleted

## 2015-11-23 NOTE — Telephone Encounter (Signed)
Patient scheduled at [facility] on 12/06/2015 at 2pm to see Ileana Roup Registered PT   Patient aware of date and time

## 2015-11-28 ENCOUNTER — Telehealth: Payer: Self-pay | Admitting: Cardiovascular Disease

## 2015-11-28 NOTE — Telephone Encounter (Signed)
Pt said she starting having PVC Saturday and Sunday, She is still having them a little today,wants to know if she should increase her Metoprolol.?

## 2015-11-28 NOTE — Telephone Encounter (Signed)
Left message on machine for pt to contact the office.   

## 2015-11-30 NOTE — Telephone Encounter (Signed)
Left message for the pt to contact the office if she is still having issues.

## 2015-12-01 NOTE — Telephone Encounter (Signed)
Follow up ° ° ° ° ° °Returning a call to the nurse °

## 2015-12-01 NOTE — Telephone Encounter (Signed)
LMTCB

## 2015-12-02 ENCOUNTER — Telehealth: Payer: Self-pay | Admitting: Cardiovascular Disease

## 2015-12-02 NOTE — Telephone Encounter (Signed)
LM again to call if continuing to have problems.  Will close note for now.

## 2015-12-02 NOTE — Telephone Encounter (Signed)
Follow Up ° °Pt returned call//  °

## 2015-12-02 NOTE — Telephone Encounter (Signed)
Returning call.  States she had called earlier in the week because she was having palpitations.  States she "on her own"  Increased her Toprol from 25 mg to 50 mg at HS (taking 2- 25 mg tab) and increased her Altace from 5 mg to 10 mg ( 2- 5 mg tab).  States Dr. Lia Foyer had decreased the 2 meds when she wasn't having any palpitations.  Since she started having them again she decided to increase to see if that would help which it has.  States she has been under some stress and has been drinking a lot of caffeine.  Advised to reduce the caffeine and try to manage the stress.  She will continue the increase until she hears back from Dr. Burt Knack next week.  Advised he would not be in office until next Wed 3/1. She verbalizes understanding and will wait for Dr. Antionette Char recommendations. Have not changed medication in list until Dr. Burt Knack reviews.

## 2015-12-07 NOTE — Telephone Encounter (Signed)
Notified that Dr. Burt Knack said it was fine to continue taking the increase dose of Toprol and Altace.  Will update in med list.  She states she is still having some palpitations but not like it was last week.

## 2015-12-07 NOTE — Telephone Encounter (Signed)
Appointment scheduled 12/28/15 with Dr Burt Knack.

## 2015-12-07 NOTE — Telephone Encounter (Signed)
Fine to continue with medications as she is currently doing on increased doses

## 2015-12-07 NOTE — Telephone Encounter (Signed)
Left message for pt to call the office and schedule follow-up appointment with Dr Burt Knack the week of 12/26/15.

## 2015-12-08 ENCOUNTER — Telehealth: Payer: Self-pay | Admitting: Gastroenterology

## 2015-12-08 DIAGNOSIS — M6289 Other specified disorders of muscle: Secondary | ICD-10-CM

## 2015-12-08 NOTE — Telephone Encounter (Signed)
Records faxed today  to Ileana Roup at [facility] with demographics and insurance cards and referral

## 2015-12-16 ENCOUNTER — Ambulatory Visit
Admission: RE | Admit: 2015-12-16 | Discharge: 2015-12-16 | Disposition: A | Discharge status: Home / Self Care | Payer: Medicare HMO | Source: Ambulatory Visit | Attending: Internal Medicine | Admitting: Internal Medicine

## 2015-12-16 DIAGNOSIS — G8929 Other chronic pain: Secondary | ICD-10-CM

## 2015-12-16 DIAGNOSIS — M47817 Spondylosis without myelopathy or radiculopathy, lumbosacral region: Secondary | ICD-10-CM | POA: Diagnosis not present

## 2015-12-16 DIAGNOSIS — M545 Low back pain, unspecified: Secondary | ICD-10-CM

## 2015-12-16 MED ORDER — IOHEXOL 180 MG/ML  SOLN
1.0000 mL | Freq: Once | INTRAMUSCULAR | Status: AC | PRN
  Administered 2015-12-16: 1 mL via EPIDURAL

## 2015-12-16 MED ORDER — METHYLPREDNISOLONE ACETATE 40 MG/ML INJ SUSP (RADIOLOG
120.0000 mg | Freq: Once | INTRAMUSCULAR | Status: AC
  Administered 2015-12-16: 120 mg via EPIDURAL

## 2015-12-16 NOTE — Discharge Instructions (Signed)

## 2015-12-25 NOTE — Progress Notes (Signed)
Cardiology Office Note Date:  12/26/2015   ID:  Jeanne Kim, DOB 1941/04/15, MRN [id number]  PCP:  Neale Burly, MD  Cardiologist:  Sherren Mocha, MD    Chief Complaint  Patient presents with  . Chest Pain    History of Present Illness: Jeanne Kim is a 75 y.o. female who presents for follow-up of nonobstructive CAD. She was last seen one year ago. She underwent heart catheterization in 2006 demonstrating mild nonobstructive CAD with 20-30% stenosis of the left main and LAD. There is no other significant disease noted. Left ventricular function was normal. She has carotid stenosis which has been graded at mild to moderate and she is followed by Dr. Donnetta Hutching.  Complains of 'arms feeling heavy' and shortness of breath. Also complains of chest heaviness, unrelated to exertion. Feels her overall health is worse. She's had neck surgery and problems with chronic neck pain, numbness in arms and hands. Less active than in the past. She has taken NTG on occasion with relief of symptoms. She is short of breath with one flight of steps. Feels fatigued.    Past Medical History  Diagnosis Date  . Esophageal reflux   . Hiatal hernia   . Unspecified gastritis and gastroduodenitis without mention of hemorrhage   . Esophageal stricture   . Type II or unspecified type diabetes mellitus without mention of complication, not stated as uncontrolled   . Fibromyalgia   . Hypertension   . Anxiety   . Depression   . Sleep apnea     CPAP  . Kidney stones   . Hypercholesterolemia   . IBS (irritable bowel syndrome)   . Cataract   . Anemia   . Coronary heart disease   . Pneumonia     recently, just finished ABO  . Carotid artery plaque     bilat  . Anginal pain ([place])   . Arthritis   . Dizziness   . PONV (postoperative nausea and vomiting)   . Breast cancer ([place])   . Thyroid disease 2002  . Hypothyroidism   . Pneumonia   . Gallstones   . Diverticulosis   . Colon polyps     adenomatous     Past Surgical History  Procedure Laterality Date  . Cardiac catheterization    . Total abdominal hysterectomy    . Appendectomy    . Cystocele repair    . Rectocele repair    . Thyroidectomy    . Breast lumpectomy      left  . Cervical fusion    . Cholecystectomy    . Tonsillectomy    . Cesarean section    . Cataract extraction w/phaco Left 03/30/2013    Procedure: CATARACT EXTRACTION PHACO AND INTRAOCULAR LENS PLACEMENT (IOC);  Surgeon: Tonny Branch, MD;  Location: [facility];  Service: Ophthalmology;  Laterality: Left;  CDE:19.28  . Cataract extraction w/phaco Right 04/09/2013    Procedure: CATARACT EXTRACTION PHACO AND INTRAOCULAR LENS PLACEMENT (IOC);  Surgeon: Tonny Branch, MD;  Location: [facility];  Service: Ophthalmology;  Laterality: Right;  CDE: 15.44  . Eye surgery Left 03-30-13    Cataract  . Eye surgery Right 04-09-13    Cataract  . Dilation and curettage of uterus    . Anal rectal manometry N/A 08/05/2015    Procedure: ANO RECTAL MANOMETRY;  Surgeon: Mauri Pole, MD;  Location: [facility];  Service: Endoscopy;  Laterality: N/A;    Current Outpatient Prescriptions  Medication Sig Dispense Refill  .  aspirin 325 MG tablet Take 325 mg by mouth daily.    Marland Kitchen atorvastatin (LIPITOR) 20 MG tablet Take 1 tablet (20 mg total) by mouth daily. 90 tablet 3  . BELSOMRA 10 MG TABS Take 1 tablet by mouth daily.   0  . calcium-vitamin D (OSCAL WITH D) 500-200 MG-UNIT per tablet Take 1 tablet by mouth 2 (two) times daily.    . Coenzyme Q10 (CO Q-10) 100 MG CAPS Take 100 mg by mouth daily.    . cyanocobalamin (,VITAMIN B-12,) 1000 MCG/ML injection Inject 1 mL (1,000 mcg total) into the muscle once. 1 mL 12  . diclofenac (VOLTAREN) 75 MG EC tablet Take by mouth 2 (two) times daily.     . DULoxetine (CYMBALTA) 60 MG capsule Take 60 mg by mouth daily.    . fexofenadine (ALLEGRA) 30 MG tablet Take 30 mg by mouth daily.    . fluticasone (FLONASE) 50 MCG/ACT nasal spray Place 2 sprays into the  nose daily.    . furosemide (LASIX) 40 MG tablet Take 1 pill daily for 3 days then as needed for swelling 30 tablet 6  . gabapentin (NEURONTIN) 300 MG capsule Take 300 mg by mouth at bedtime.    Marland Kitchen gentamicin (GARAMYCIN) 0.3 % ophthalmic solution     . glipiZIDE (GLUCOTROL) 10 MG tablet Take 10 mg by mouth daily before breakfast.    . Insulin Lispro Prot & Lispro (HUMALOG MIX 75/25 KWIKPEN) (75-25) 100 UNIT/ML Kwikpen Inject into the skin.    Marland Kitchen LANTUS SOLOSTAR 100 UNIT/ML Solostar Pen Inject 20 Units into the skin at bedtime.    Marland Kitchen levothyroxine (SYNTHROID, LEVOTHROID) 125 MCG tablet Take 1 tablet by mouth daily.    . Linaclotide (LINZESS) 290 MCG CAPS capsule Take 1 capsule (290 mcg total) by mouth daily. 30 capsule 6  . Liraglutide 18 MG/3ML SOPN Inject 1.2 mg into the skin daily.     . metFORMIN (GLUCOPHAGE) 1000 MG tablet Take 1,000 mg by mouth 2 (two) times daily with a meal.    . metoprolol succinate (TOPROL-XL) 25 MG 24 hr tablet Take 2 tablets ( total of 50 mg) by mouth at bedtime. 90 tablet 0  . midodrine (PROAMATINE) 2.5 MG tablet Take 2.5 mg by mouth 3 (three) times daily.  0  . montelukast (SINGULAIR) 10 MG tablet Take 10 mg by mouth at bedtime.    . Needles & Syringes MISC 25G 5/8 inch needle on 3 ml syringe-Use once monthly for B12 injections 12 each 0  . omeprazole (PRILOSEC) 20 MG capsule Take 20 mg by mouth 2 (two) times daily.     . potassium chloride (K-DUR,KLOR-CON) 10 MEQ tablet Take 10 mEq by mouth once. Take one tablet every time you take lasix    . ramipril (ALTACE) 5 MG capsule Take 2 tablets ( total of 10 mg) at bedtime 90 capsule 3  . traMADol (ULTRAM) 50 MG tablet TAKE 1 OR 2 TABLETS BY MOUTH EVERY 4-6 HOURS AS NEEDED FOR PAIN  0  . triamterene-hydrochlorothiazide (MAXZIDE-25) 37.5-25 MG per tablet Take 1 tablet by mouth daily.     Marland Kitchen VAGIFEM 10 MCG TABS vaginal tablet Place 1 tablet vaginally once a week.   0   No current facility-administered medications for this  visit.    Allergies:   Codeine and Sulfonamide derivatives   Social History:  The patient  reports that she has never smoked. She has never used smokeless tobacco. She reports that she does not drink alcohol  or use illicit drugs.   Family History:  The patient's  family history includes Cancer in her father and mother; Colon cancer in her maternal uncle; Colon polyps in her maternal uncle; Diabetes in her father, maternal aunt, maternal uncle, and mother; Esophageal cancer in her maternal uncle; Heart disease in her father and paternal grandmother; Hyperlipidemia in her father; Hypertension in her father and mother; Lung cancer in her father and maternal uncle; Ovarian cancer in her maternal aunt; Sarcoidosis in her mother; Urolithiasis in her father.   ROS:  Please see the history of present illness.  Otherwise, review of systems is positive for orthopnea, shortness of breath with exertion, leg swelling, chest pain.  All other systems are reviewed and negative.   PHYSICAL EXAM: VS:  BP 98/62 mmHg  Pulse 62  Ht 5\' 4"  (1.626 m)  Wt 169 lb (76.658 kg)  BMI 28.99 kg/m2 , BMI Body mass index is 28.99 kg/(m^2). GEN: Well nourished, well developed, in no acute distress HEENT: normal Neck: no JVD, no masses. No carotid bruits Cardiac: RRR without murmur or gallop                Respiratory:  clear to auscultation bilaterally, normal work of breathing GI: soft, nontender, nondistended, + BS MS: no deformity or atrophy Ext: no pretibial edema, pedal pulses 2+= bilaterally Skin: warm and dry, no rash Neuro:  Strength and sensation are intact Psych: euthymic mood, full affect  EKG:  EKG is ordered today. The ekg ordered today shows NSR 62 bpm, inferior infarct age undetermined, no change from previous studies.   Recent Labs: 07/15/2015: BUN 24*; Creatinine, Ser 1.02; Hemoglobin 11.1*; Platelets 337.0; Potassium 4.2; Sodium 135   Lipid Panel     Component Value Date/Time   CHOL 123  01/20/2013 1626   TRIG 255.0* 01/20/2013 1626   HDL 41.10 01/20/2013 1626   CHOLHDL 3 01/20/2013 1626   VLDL 51.0* 01/20/2013 1626   LDLCALC 41 12/28/2008 1140   LDLDIRECT 46.2 01/20/2013 1626      Wt Readings from Last 3 Encounters:  12/26/15 169 lb (76.658 kg)  11/22/15 171 lb (77.565 kg)  07/19/15 188 lb 4.8 oz (85.412 kg)     Cardiac Studies Reviewed: Myoview 05/11/2014: Overall Impression: Normal stress nuclear study.  LV Ejection Fraction: 77%. LV Wall Motion: NL LV Function; NL Wall Motion  2D Echo 12/24/2013: Study Conclusions  - Left ventricle: The cavity size was normal. There was mild concentric hypertrophy. Systolic function was normal. The estimated ejection fraction was in the range of 60% to 65%. Wall motion was normal; there were no regional wall motion abnormalities. Doppler parameters are consistent with abnormal left ventricular relaxation (grade 1 diastolic dysfunction). Doppler parameters are consistent with high ventricular filling pressure. - Aortic valve: Mild regurgitation. Impressions:  - No change when compared to prior ECHO.  ASSESSMENT AND PLAN: 1.  CAD, native vessel, CCS 2 angina: chronic symptoms noted. Last nuclear scan as outlined above. Notes progressive symptoms of arm heaviness and chest pain. Difficult to know if her symptoms are cardiac related as she has a multitude of physical symptoms. I think we should update her nuclear stress testing as she does have type 2 diabetes, hypertension, and hyperlipidemia. She is not able to exercise , so will order a pharmacologic Myoview scan.  2. Essential HTN: controlled on multidrug therapy  3. Hyperlipidemia: treated with atorvastatin. Lifestyle modification reviewed.   4. Type II DM: managed by PCP  5. Carotid Stenosis: last  OV from [place] reviewed with stable carotid studies in October 2016 - recommendations for medical therapy noted.   6. Palpitations: nocturnal  - wakes her up from sleep. Not associated with other symptoms. Benign palpitations by history. Continue beta blocker. No evidence of conduction disease on her 12-lead EKG.   Current medicines are reviewed with the patient today.  The patient does not have concerns regarding medicines.  Labs/ tests ordered today include:   Orders Placed This Encounter  Procedures  . Myocardial Perfusion Imaging  . EKG 12-Lead   Disposition:   FU one year  Signed, Sherren Mocha, MD  12/26/2015 4:41 PM    [facility] [address], [place], [place]  [address] Phone: [phone]; Fax: [phone]

## 2015-12-26 ENCOUNTER — Encounter: Payer: Self-pay | Admitting: Cardiovascular Disease

## 2015-12-26 ENCOUNTER — Ambulatory Visit (INDEPENDENT_AMBULATORY_CARE_PROVIDER_SITE_OTHER): Payer: Medicare HMO | Admitting: Cardiovascular Disease

## 2015-12-26 VITALS — BP 98/62 | HR 62 | Ht 64.0 in | Wt 169.0 lb

## 2015-12-26 DIAGNOSIS — I25118 Atherosclerotic heart disease of native coronary artery with other forms of angina pectoris: Secondary | ICD-10-CM

## 2015-12-26 NOTE — Patient Instructions (Signed)
Medication Instructions:  Your physician recommends that you continue on your current medications as directed. Please refer to the Current Medication list given to you today.  Labwork: No new orders.   Testing/Procedures: Your physician has requested that you have a lexiscan myoview. For further information please visit [email or web address]. Please follow instruction sheet, as given.  Follow-Up: Your physician wants you to follow-up in: 1 YEAR with Dr Cooper.  You will receive a reminder letter in the mail two months in advance. If you don't receive a letter, please call our office to schedule the follow-up appointment.   Any Other Special Instructions Will Be Listed Below (If Applicable).     If you need a refill on your cardiac medications before your next appointment, please call your pharmacy.   

## 2015-12-27 DIAGNOSIS — N898 Other specified noninflammatory disorders of vagina: Secondary | ICD-10-CM | POA: Diagnosis not present

## 2015-12-27 DIAGNOSIS — Z01419 Encounter for gynecological examination (general) (routine) without abnormal findings: Secondary | ICD-10-CM | POA: Diagnosis not present

## 2015-12-28 ENCOUNTER — Ambulatory Visit: Payer: Medicare HMO | Admitting: Cardiovascular Disease

## 2016-01-02 DIAGNOSIS — M62838 Other muscle spasm: Secondary | ICD-10-CM | POA: Diagnosis not present

## 2016-01-02 DIAGNOSIS — M6281 Muscle weakness (generalized): Secondary | ICD-10-CM | POA: Diagnosis not present

## 2016-01-02 DIAGNOSIS — R278 Other lack of coordination: Secondary | ICD-10-CM | POA: Diagnosis not present

## 2016-01-02 DIAGNOSIS — K5900 Constipation, unspecified: Secondary | ICD-10-CM | POA: Diagnosis not present

## 2016-01-02 DIAGNOSIS — R159 Full incontinence of feces: Secondary | ICD-10-CM | POA: Diagnosis not present

## 2016-01-03 DIAGNOSIS — E039 Hypothyroidism, unspecified: Secondary | ICD-10-CM | POA: Diagnosis not present

## 2016-01-03 DIAGNOSIS — I2510 Atherosclerotic heart disease of native coronary artery without angina pectoris: Secondary | ICD-10-CM | POA: Diagnosis not present

## 2016-01-03 DIAGNOSIS — Z7982 Long term (current) use of aspirin: Secondary | ICD-10-CM | POA: Diagnosis not present

## 2016-01-03 DIAGNOSIS — E1165 Type 2 diabetes mellitus with hyperglycemia: Secondary | ICD-10-CM | POA: Diagnosis not present

## 2016-01-03 DIAGNOSIS — Z794 Long term (current) use of insulin: Secondary | ICD-10-CM | POA: Diagnosis not present

## 2016-01-03 DIAGNOSIS — Z79899 Other long term (current) drug therapy: Secondary | ICD-10-CM | POA: Diagnosis not present

## 2016-01-04 ENCOUNTER — Telehealth (HOSPITAL_COMMUNITY): Payer: Self-pay | Admitting: *Deleted

## 2016-01-04 NOTE — Telephone Encounter (Signed)
Left message on voicemail in reference to upcoming appointment scheduled for 01/10/16. Phone number given for a call back so details instructions can be given. Hubbard Robinson, RN

## 2016-01-05 ENCOUNTER — Telehealth (HOSPITAL_COMMUNITY): Payer: Self-pay | Admitting: *Deleted

## 2016-01-05 NOTE — Telephone Encounter (Signed)
Left message on voicemail in reference to upcoming appointment scheduled for 01/09/16. Phone number given for a call back so details instructions can be given. Hubbard Robinson, RN

## 2016-01-09 ENCOUNTER — Telehealth (HOSPITAL_COMMUNITY): Payer: Self-pay | Admitting: *Deleted

## 2016-01-09 DIAGNOSIS — K5900 Constipation, unspecified: Secondary | ICD-10-CM | POA: Diagnosis not present

## 2016-01-09 DIAGNOSIS — M6281 Muscle weakness (generalized): Secondary | ICD-10-CM | POA: Diagnosis not present

## 2016-01-09 DIAGNOSIS — M62838 Other muscle spasm: Secondary | ICD-10-CM | POA: Diagnosis not present

## 2016-01-09 DIAGNOSIS — R159 Full incontinence of feces: Secondary | ICD-10-CM | POA: Diagnosis not present

## 2016-01-09 DIAGNOSIS — R278 Other lack of coordination: Secondary | ICD-10-CM | POA: Diagnosis not present

## 2016-01-09 NOTE — Telephone Encounter (Signed)
Left message on voicemail in reference to upcoming appointment scheduled for 01/10/16. Phone number given for a call back so details instructions can be given. Jerrie Schussler, Ranae Palms

## 2016-01-10 ENCOUNTER — Ambulatory Visit (HOSPITAL_COMMUNITY): Payer: Medicare HMO | Attending: Cardiovascular Disease

## 2016-01-10 DIAGNOSIS — I779 Disorder of arteries and arterioles, unspecified: Secondary | ICD-10-CM | POA: Diagnosis not present

## 2016-01-10 DIAGNOSIS — R5383 Other fatigue: Secondary | ICD-10-CM | POA: Insufficient documentation

## 2016-01-10 DIAGNOSIS — E119 Type 2 diabetes mellitus without complications: Secondary | ICD-10-CM | POA: Insufficient documentation

## 2016-01-10 DIAGNOSIS — I10 Essential (primary) hypertension: Secondary | ICD-10-CM | POA: Diagnosis not present

## 2016-01-10 DIAGNOSIS — I25118 Atherosclerotic heart disease of native coronary artery with other forms of angina pectoris: Secondary | ICD-10-CM | POA: Diagnosis not present

## 2016-01-10 DIAGNOSIS — R079 Chest pain, unspecified: Secondary | ICD-10-CM | POA: Diagnosis not present

## 2016-01-10 DIAGNOSIS — R002 Palpitations: Secondary | ICD-10-CM | POA: Insufficient documentation

## 2016-01-10 DIAGNOSIS — R0609 Other forms of dyspnea: Secondary | ICD-10-CM | POA: Insufficient documentation

## 2016-01-10 LAB — MYOCARDIAL PERFUSION IMAGING
CSEPPHR: 74 {beats}/min
LVDIAVOL: 75 mL (ref 46–106)
LVSYSVOL: 26 mL
RATE: 0.24
Rest HR: 67 {beats}/min
SDS: 1
SRS: 3
SSS: 4
TID: 0.94

## 2016-01-10 MED ORDER — TECHNETIUM TC 99M SESTAMIBI GENERIC - CARDIOLITE
10.8000 | Freq: Once | INTRAVENOUS | Status: AC | PRN
  Administered 2016-01-10: 11 via INTRAVENOUS

## 2016-01-10 MED ORDER — TECHNETIUM TC 99M SESTAMIBI GENERIC - CARDIOLITE
33.0000 | Freq: Once | INTRAVENOUS | Status: AC | PRN
  Administered 2016-01-10: 33 via INTRAVENOUS

## 2016-01-10 MED ORDER — REGADENOSON 0.4 MG/5ML IV SOLN
0.4000 mg | Freq: Once | INTRAVENOUS | Status: AC
  Administered 2016-01-10: 0.4 mg via INTRAVENOUS

## 2016-01-11 ENCOUNTER — Other Ambulatory Visit: Payer: Self-pay

## 2016-01-11 MED ORDER — METOPROLOL SUCCINATE ER 50 MG PO TB24: 50.0000 mg | ORAL_TABLET | Freq: Every day | ORAL | Status: DC

## 2016-01-11 MED ORDER — RAMIPRIL 5 MG PO CAPS: 5.0000 mg | ORAL_CAPSULE | Freq: Every day | ORAL | Status: DC

## 2016-01-24 DIAGNOSIS — Z0000 Encounter for general adult medical examination without abnormal findings: Secondary | ICD-10-CM | POA: Diagnosis not present

## 2016-01-24 DIAGNOSIS — I10 Essential (primary) hypertension: Secondary | ICD-10-CM | POA: Diagnosis not present

## 2016-01-24 DIAGNOSIS — Z1389 Encounter for screening for other disorder: Secondary | ICD-10-CM | POA: Diagnosis not present

## 2016-01-24 DIAGNOSIS — Z6829 Body mass index (BMI) 29.0-29.9, adult: Secondary | ICD-10-CM | POA: Diagnosis not present

## 2016-01-24 DIAGNOSIS — E1121 Type 2 diabetes mellitus with diabetic nephropathy: Secondary | ICD-10-CM | POA: Diagnosis not present

## 2016-01-25 DIAGNOSIS — E1121 Type 2 diabetes mellitus with diabetic nephropathy: Secondary | ICD-10-CM | POA: Diagnosis not present

## 2016-01-25 DIAGNOSIS — Z0000 Encounter for general adult medical examination without abnormal findings: Secondary | ICD-10-CM | POA: Diagnosis not present

## 2016-01-25 DIAGNOSIS — I10 Essential (primary) hypertension: Secondary | ICD-10-CM | POA: Diagnosis not present

## 2016-02-06 DIAGNOSIS — S060X9A Concussion with loss of consciousness of unspecified duration, initial encounter: Secondary | ICD-10-CM

## 2016-02-06 DIAGNOSIS — S060XAA Concussion with loss of consciousness status unknown, initial encounter: Secondary | ICD-10-CM

## 2016-02-06 HISTORY — DX: Concussion with loss of consciousness status unknown, initial encounter: S06.0XAA

## 2016-02-06 HISTORY — DX: Concussion with loss of consciousness of unspecified duration, initial encounter: S06.0X9A

## 2016-02-08 DIAGNOSIS — M6281 Muscle weakness (generalized): Secondary | ICD-10-CM | POA: Diagnosis not present

## 2016-02-08 DIAGNOSIS — R159 Full incontinence of feces: Secondary | ICD-10-CM | POA: Diagnosis not present

## 2016-02-08 DIAGNOSIS — K5900 Constipation, unspecified: Secondary | ICD-10-CM | POA: Diagnosis not present

## 2016-02-08 DIAGNOSIS — R278 Other lack of coordination: Secondary | ICD-10-CM | POA: Diagnosis not present

## 2016-02-08 DIAGNOSIS — M62838 Other muscle spasm: Secondary | ICD-10-CM | POA: Diagnosis not present

## 2016-02-14 DIAGNOSIS — R278 Other lack of coordination: Secondary | ICD-10-CM | POA: Diagnosis not present

## 2016-02-14 DIAGNOSIS — K5900 Constipation, unspecified: Secondary | ICD-10-CM | POA: Diagnosis not present

## 2016-02-14 DIAGNOSIS — R159 Full incontinence of feces: Secondary | ICD-10-CM | POA: Diagnosis not present

## 2016-02-14 DIAGNOSIS — M62838 Other muscle spasm: Secondary | ICD-10-CM | POA: Diagnosis not present

## 2016-02-14 DIAGNOSIS — M6281 Muscle weakness (generalized): Secondary | ICD-10-CM | POA: Diagnosis not present

## 2016-02-17 DIAGNOSIS — S0990XA Unspecified injury of head, initial encounter: Secondary | ICD-10-CM | POA: Diagnosis not present

## 2016-02-17 DIAGNOSIS — S199XXA Unspecified injury of neck, initial encounter: Secondary | ICD-10-CM | POA: Diagnosis not present

## 2016-03-30 ENCOUNTER — Encounter: Payer: Self-pay | Admitting: Gastroenterology

## 2016-04-03 DIAGNOSIS — K5900 Constipation, unspecified: Secondary | ICD-10-CM | POA: Diagnosis not present

## 2016-04-03 DIAGNOSIS — M62838 Other muscle spasm: Secondary | ICD-10-CM | POA: Diagnosis not present

## 2016-04-03 DIAGNOSIS — M6281 Muscle weakness (generalized): Secondary | ICD-10-CM | POA: Diagnosis not present

## 2016-04-03 DIAGNOSIS — R278 Other lack of coordination: Secondary | ICD-10-CM | POA: Diagnosis not present

## 2016-04-17 DIAGNOSIS — K5900 Constipation, unspecified: Secondary | ICD-10-CM | POA: Diagnosis not present

## 2016-04-17 DIAGNOSIS — M6281 Muscle weakness (generalized): Secondary | ICD-10-CM | POA: Diagnosis not present

## 2016-04-17 DIAGNOSIS — M62838 Other muscle spasm: Secondary | ICD-10-CM | POA: Diagnosis not present

## 2016-04-17 DIAGNOSIS — R159 Full incontinence of feces: Secondary | ICD-10-CM | POA: Diagnosis not present

## 2016-04-17 DIAGNOSIS — R278 Other lack of coordination: Secondary | ICD-10-CM | POA: Diagnosis not present

## 2016-04-18 DIAGNOSIS — R69 Illness, unspecified: Secondary | ICD-10-CM | POA: Diagnosis not present

## 2016-04-24 DIAGNOSIS — E1121 Type 2 diabetes mellitus with diabetic nephropathy: Secondary | ICD-10-CM | POA: Diagnosis not present

## 2016-04-24 DIAGNOSIS — I10 Essential (primary) hypertension: Secondary | ICD-10-CM | POA: Diagnosis not present

## 2016-04-24 DIAGNOSIS — Z6829 Body mass index (BMI) 29.0-29.9, adult: Secondary | ICD-10-CM | POA: Diagnosis not present

## 2016-04-24 DIAGNOSIS — G4762 Sleep related leg cramps: Secondary | ICD-10-CM | POA: Diagnosis not present

## 2016-04-24 DIAGNOSIS — E038 Other specified hypothyroidism: Secondary | ICD-10-CM | POA: Diagnosis not present

## 2016-04-24 DIAGNOSIS — Z853 Personal history of malignant neoplasm of breast: Secondary | ICD-10-CM | POA: Diagnosis not present

## 2016-04-24 DIAGNOSIS — K210 Gastro-esophageal reflux disease with esophagitis: Secondary | ICD-10-CM | POA: Diagnosis not present

## 2016-04-24 DIAGNOSIS — R69 Illness, unspecified: Secondary | ICD-10-CM | POA: Diagnosis not present

## 2016-04-26 DIAGNOSIS — R278 Other lack of coordination: Secondary | ICD-10-CM | POA: Diagnosis not present

## 2016-04-26 DIAGNOSIS — K5900 Constipation, unspecified: Secondary | ICD-10-CM | POA: Diagnosis not present

## 2016-04-26 DIAGNOSIS — M62838 Other muscle spasm: Secondary | ICD-10-CM | POA: Diagnosis not present

## 2016-04-26 DIAGNOSIS — M6281 Muscle weakness (generalized): Secondary | ICD-10-CM | POA: Diagnosis not present

## 2016-04-26 DIAGNOSIS — R159 Full incontinence of feces: Secondary | ICD-10-CM | POA: Diagnosis not present

## 2016-05-15 DIAGNOSIS — M62838 Other muscle spasm: Secondary | ICD-10-CM | POA: Diagnosis not present

## 2016-05-15 DIAGNOSIS — R278 Other lack of coordination: Secondary | ICD-10-CM | POA: Diagnosis not present

## 2016-05-15 DIAGNOSIS — M6281 Muscle weakness (generalized): Secondary | ICD-10-CM | POA: Diagnosis not present

## 2016-05-15 DIAGNOSIS — K5900 Constipation, unspecified: Secondary | ICD-10-CM | POA: Diagnosis not present

## 2016-05-15 DIAGNOSIS — R159 Full incontinence of feces: Secondary | ICD-10-CM | POA: Diagnosis not present

## 2016-05-15 DIAGNOSIS — R151 Fecal smearing: Secondary | ICD-10-CM | POA: Diagnosis not present

## 2016-05-23 DIAGNOSIS — K5900 Constipation, unspecified: Secondary | ICD-10-CM | POA: Diagnosis not present

## 2016-05-23 DIAGNOSIS — M62838 Other muscle spasm: Secondary | ICD-10-CM | POA: Diagnosis not present

## 2016-05-23 DIAGNOSIS — N3946 Mixed incontinence: Secondary | ICD-10-CM | POA: Diagnosis not present

## 2016-05-23 DIAGNOSIS — M6281 Muscle weakness (generalized): Secondary | ICD-10-CM | POA: Diagnosis not present

## 2016-05-23 DIAGNOSIS — R159 Full incontinence of feces: Secondary | ICD-10-CM | POA: Diagnosis not present

## 2016-05-23 DIAGNOSIS — R151 Fecal smearing: Secondary | ICD-10-CM | POA: Diagnosis not present

## 2016-06-01 ENCOUNTER — Encounter: Payer: Self-pay | Admitting: Gastroenterology

## 2016-06-01 ENCOUNTER — Ambulatory Visit (INDEPENDENT_AMBULATORY_CARE_PROVIDER_SITE_OTHER): Payer: Medicare HMO | Admitting: Gastroenterology

## 2016-06-01 VITALS — BP 96/52 | HR 76 | Ht 64.0 in | Wt 181.0 lb

## 2016-06-01 DIAGNOSIS — K219 Gastro-esophageal reflux disease without esophagitis: Secondary | ICD-10-CM | POA: Diagnosis not present

## 2016-06-01 DIAGNOSIS — R1013 Epigastric pain: Secondary | ICD-10-CM

## 2016-06-01 DIAGNOSIS — K5900 Constipation, unspecified: Secondary | ICD-10-CM

## 2016-06-01 NOTE — Patient Instructions (Addendum)
You have been scheduled for an endoscopy. Please follow written instructions given to you at your visit today. If you use inhalers (even only as needed), please bring them with you on the day of your procedure. Your physician has requested that you go to www.[email or web address] and enter the access code given to you at your visit today. This web site gives a general overview about your procedure. However, you should still follow specific instructions given to you by our office regarding your preparation for the procedure.   Take Protonix 40 mg twice a day  STOP 325 mg Asprin Start 81 mg Asprin  STOP diclofenac  NO Oral diabetic meds the morning of your procedure, seperate diabetic instructions given  Do a bowel purge with miralax and gateraid  Use 1 bottle of Miralax in 64 ounces of gateraid  Mix and drink 8 ounces every 15 minutes     Low-Fiber Diet Fiber is found in fruits, vegetables, and whole grains. A low-fiber diet restricts fibrous foods that are not digested in the small intestine. A diet containing about 10-15 grams of fiber per day is considered low fiber. Low-fiber diets may be used to:  Promote healing and rest the bowel during intestinal flare-ups.  Prevent blockage of a partially obstructed or narrowed gastrointestinal tract.  Reduce fecal weight and volume.  Slow the movement of feces. You may be on a low-fiber diet as a transitional diet following surgery, after an injury (trauma), or because of a short (acute) or lifelong (chronic) illness. Your health care provider will determine the length of time you need to stay on this diet.  WHAT DO I NEED TO KNOW ABOUT A LOW-FIBER DIET? Always check the fiber content on the packaging's Nutrition Facts label, especially on foods from the grains list. Ask your dietitian if you have questions about specific foods that are related to your condition, especially if the food is not listed below. In general, a low-fiber food will have less  than 2 g of fiber. WHAT FOODS CAN I EAT? Grains All breads and crackers made with white flour. Sweet rolls, doughnuts, waffles, pancakes, [place] toast, bagels. Pretzels, Melba toast, zwieback. Well-cooked cereals, such as cornmeal, farina, or cream cereals. Dry cereals that do not contain whole grains, fruit, or nuts, such as refined corn, wheat, rice, and oat cereals. Potatoes prepared any way without skins, plain pastas and noodles, refined white rice. Use white flour for baking and making sauces. Use allowed list of grains for casseroles, dumplings, and puddings.  Vegetables Strained tomato and vegetable juices. Fresh lettuce, cucumber, spinach. Well-cooked (no skin or pulp) or canned vegetables, such as asparagus, bean sprouts, beets, carrots, green beans, mushrooms, potatoes, pumpkin, spinach, yellow squash, tomato sauce/puree, turnips, yams, and zucchini. Keep servings limited to  cup.  Fruits All fruit juices except prune juice. Cooked or canned fruits without skin and seeds, such as applesauce, apricots, cherries, fruit cocktail, grapefruit, grapes, mandarin oranges, melons, peaches, pears, pineapple, and plums. Fresh fruits without skin, such as apricots, avocados, bananas, melons, pineapple, nectarines, and peaches. Keep servings limited to  cup or 1 piece.  Meat and Other Protein Sources Ground or well-cooked tender beef, ham, veal, lamb, pork, or poultry. Eggs, plain cheese. Fish, oysters, shrimp, lobster, and other seafood. Liver, organ meats. Smooth nut butters. Dairy All milk products and alternative dairy substitutes, such as soy, rice, almond, and coconut, not containing added whole nuts, seeds, or added fruit. Beverages Decaf coffee, fruit, and vegetable juices or smoothies (small amounts,  with no pulp or skins, and with fruits from allowed list), sports drinks, herbal tea. Condiments Ketchup, mustard, vinegar, cream sauce, cheese sauce, cocoa powder. Spices in moderation, such as  allspice, basil, bay leaves, celery powder or leaves, cinnamon, cumin powder, curry powder, ginger, mace, marjoram, onion or garlic powder, oregano, paprika, parsley flakes, ground pepper, rosemary, sage, savory, tarragon, thyme, and turmeric. Sweets and Desserts Plain cakes and cookies, pie made with allowed fruit, pudding, custard, cream pie. Gelatin, fruit, ice, sherbet, frozen ice pops. Ice cream, ice milk without nuts. Plain hard candy, honey, jelly, molasses, syrup, sugar, chocolate syrup, gumdrops, marshmallows. Limit overall sugar intake.  Fats and Oil Margarine, butter, cream, mayonnaise, salad oils, plain salad dressings made from allowed foods. Choose healthy fats such as olive oil, canola oil, and omega-3 fatty acids (such as found in salmon or tuna) when possible.  Other Bouillon, broth, or cream soups made from allowed foods. Any strained soup. Casseroles or mixed dishes made with allowed foods. The items listed above may not be a complete list of recommended foods or beverages. Contact your dietitian for more options.  WHAT FOODS ARE NOT RECOMMENDED? Grains All whole wheat and whole grain breads and crackers. Multigrains, rye, bran seeds, nuts, or coconut. Cereals containing whole grains, multigrains, bran, coconut, nuts, raisins. Cooked or dry oatmeal, steel-cut oats. Coarse wheat cereals, granola. Cereals advertised as high fiber. Potato skins. Whole grain pasta, wild or brown rice. Popcorn. Coconut flour. Bran, buckwheat, corn bread, multigrains, rye, wheat germ.  Vegetables Fresh, cooked or canned vegetables, such as artichokes, asparagus, beet greens, broccoli, Brussels sprouts, cabbage, celery, cauliflower, corn, eggplant, kale, legumes or beans, okra, peas, and tomatoes. Avoid large servings of any vegetables, especially raw vegetables.  Fruits Fresh fruits, such as apples with or without skin, berries, cherries, figs, grapes, grapefruit, guavas, kiwis, mangoes, oranges, papayas,  pears, persimmons, pineapple, and pomegranate. Prune juice and juices with pulp, stewed or dried prunes. Dried fruits, dates, raisins. Fruit seeds or skins. Avoid large servings of all fresh fruits. Meats and Other Protein Sources Tough, fibrous meats with gristle. Chunky nut butter. Cheese made with seeds, nuts, or other foods not recommended. Nuts, seeds, legumes (beans, including baked beans), dried peas, beans, lentils.  Dairy Yogurt or cheese that contains nuts, seeds, or added fruit.  Beverages Fruit juices with high pulp, prune juice. Caffeinated coffee and teas.  Condiments Coconut, maple syrup, pickles, olives. Sweets and Desserts Desserts, cookies, or candies that contain nuts or coconut, chunky peanut butter, dried fruits. Jams, preserves with seeds, marmalade. Large amounts of sugar and sweets. Any other dessert made with fruits from the not recommended list.  Other Soups made from vegetables that are not recommended or that contain other foods not recommended.  The items listed above may not be a complete list of foods and beverages to avoid. Contact your dietitian for more information.   This information is not intended to replace advice given to you by your health care provider. Make sure you discuss any questions you have with your health care provider.   Document Released: 03/16/2002 Document Revised: 09/29/2013 Document Reviewed: 08/17/2013 Elsevier Interactive Patient Education [employer].

## 2016-06-03 NOTE — Progress Notes (Signed)
Jeanne Kim    [id number]    01/30/1941  Primary Care Physician:XAJE [name], MD  Referring Physician: Neale Burly, MD [address] [place], [place] [id number]  Chief complaint: Epigastric abdominal pain,  IBS, constipation  HPI: 75 year old lady with history of hysterectomy status post repair of cystocele, rectocele and enterocele, s/p multiple abdominal surgeries and adhesiolysis here with complaints of worsening epigastric abdominal pain, heartburn, bloating and also intermittent vomiting associated with nausea.  . She was last seen in office visit in February 2017 for follow up visit of chronic constipation and IBS  Patient had hysterectomy in 1999 and subsequently had repair of cystocele rectocele and enterocele, she has been having constipation bloating nausea for the past 15-20 years.   She currently takes laxatives every day and has bowel movement every 2-3 days, but feels constipation is also worsened in the past 1-2 weeks and hasn't had a bowel movement in last 10 days . She is taking lenses to 90 g daily . She has not been eating much in the past few days because the epigastric pain and has also had few episodes of vomiting. No hematemesis .She is taking full strength aspirin 325 mg daily for cardiac protection over the counter herself and was not recommended by any physician per patient, she is also taking nsaids on regular basis  she also has history of fecal urgency with every bowel movement and intermittent incontinence, she also has gas incontinence. She feels bloated after every meal. Anorectal manometry October 2016 showed findings suggestive of dyssynergia defecation with incomplete anal sphincter relaxation though she was able to expel the balloon.    Last colonoscopy in 2013 by Dr. Sharlett Iles showed diverticulosis and internal hemorrhoids. No polyps but she has family history of colon cancer and personal history of colon adenomas EGD 2013 with Dr.  Sharlett Iles showed moderate gastritis, hiatal hernia, distal esophageal stricture status post dilation with 5 French dilator with no heme or visible tear   Outpatient Encounter Prescriptions as of 06/01/2016  Medication Sig  . aspirin 325 MG tablet Take 325 mg by mouth daily.  Marland Kitchen atorvastatin (LIPITOR) 20 MG tablet Take 1 tablet (20 mg total) by mouth daily.  . BELSOMRA 10 MG TABS Take 1 tablet by mouth daily.   . calcium-vitamin D (OSCAL WITH D) 500-200 MG-UNIT per tablet Take 1 tablet by mouth 2 (two) times daily.  . Coenzyme Q10 (CO Q-10) 100 MG CAPS Take 100 mg by mouth daily.  . cyanocobalamin (,VITAMIN B-12,) 1000 MCG/ML injection Inject 1 mL (1,000 mcg total) into the muscle once.  . diclofenac (VOLTAREN) 75 MG EC tablet Take by mouth 2 (two) times daily.   . DULoxetine (CYMBALTA) 60 MG capsule Take 60 mg by mouth daily.  . fexofenadine (ALLEGRA) 30 MG tablet Take 30 mg by mouth daily.  . fluticasone (FLONASE) 50 MCG/ACT nasal spray Place 2 sprays into the nose daily.  . furosemide (LASIX) 40 MG tablet Take 1 pill daily for 3 days then as needed for swelling  . gabapentin (NEURONTIN) 300 MG capsule Take 300 mg by mouth at bedtime.  Marland Kitchen gentamicin (GARAMYCIN) 0.3 % ophthalmic solution   . glipiZIDE (GLUCOTROL) 10 MG tablet Take 10 mg by mouth daily before breakfast.  . LANTUS SOLOSTAR 100 UNIT/ML Solostar Pen Inject 20 Units into the skin at bedtime.  Marland Kitchen levothyroxine (SYNTHROID, LEVOTHROID) 125 MCG tablet Take 1 tablet by mouth daily.  . Linaclotide (  LINZESS) 290 MCG CAPS capsule Take 1 capsule (290 mcg total) by mouth daily.  . Liraglutide 18 MG/3ML SOPN Inject 1.2 mg into the skin daily.   . metFORMIN (GLUCOPHAGE) 1000 MG tablet Take 1,000 mg by mouth 2 (two) times daily with a meal.  . metoprolol succinate (TOPROL-XL) 50 MG 24 hr tablet Take 1 tablet (50 mg total) by mouth at bedtime.  . midodrine (PROAMATINE) 2.5 MG tablet Take 2.5 mg by mouth 3 (three) times daily.  . montelukast  (SINGULAIR) 10 MG tablet Take 10 mg by mouth at bedtime.  . Needles & Syringes MISC 25G 5/8 inch needle on 3 ml syringe-Use once monthly for B12 injections  . omeprazole (PRILOSEC) 20 MG capsule Take 20 mg by mouth 2 (two) times daily.   . potassium chloride (K-DUR,KLOR-CON) 10 MEQ tablet Take 10 mEq by mouth once. Take one tablet every time you take lasix  . ramipril (ALTACE) 5 MG capsule Take 1 capsule (5 mg total) by mouth at bedtime.  . triamterene-hydrochlorothiazide (MAXZIDE-25) 37.5-25 MG per tablet Take 1 tablet by mouth daily.   Marland Kitchen VAGIFEM 10 MCG TABS vaginal tablet Place 1 tablet vaginally once a week.   . Insulin Lispro Prot & Lispro (HUMALOG MIX 75/25 KWIKPEN) (75-25) 100 UNIT/ML Kwikpen Inject into the skin.  Marland Kitchen traMADol (ULTRAM) 50 MG tablet TAKE 1 OR 2 TABLETS BY MOUTH EVERY 4-6 HOURS AS NEEDED FOR PAIN   No facility-administered encounter medications on file as of 06/01/2016.     Allergies as of 06/01/2016 - Review Complete 06/01/2016  Allergen Reaction Noted  . Codeine Hives 04/27/2009  . Sulfonamide derivatives Hives 04/27/2009    Past Medical History:  Diagnosis Date  . Anemia   . Anginal pain ([place])   . Anxiety   . Arthritis   . Breast cancer ([place])   . Carotid artery plaque    bilat  . Cataract   . Colon polyps    adenomatous  . Concussion 02/2016   Driving restrictions for 6w  . Coronary heart disease   . Depression   . Diverticulosis   . Dizziness   . Esophageal reflux   . Esophageal stricture   . Fibromyalgia   . Gallstones   . Hiatal hernia   . Hypercholesterolemia   . Hypertension   . Hypothyroidism   . IBS (irritable bowel syndrome)   . Kidney stones   . Pneumonia    recently, just finished ABO  . Pneumonia   . PONV (postoperative nausea and vomiting)   . Sleep apnea    CPAP  . Thyroid disease 2002  . Type II or unspecified type diabetes mellitus without mention of complication, not stated as uncontrolled   . Unspecified gastritis and  gastroduodenitis without mention of hemorrhage     Past Surgical History:  Procedure Laterality Date  . ANAL RECTAL MANOMETRY N/A 08/05/2015   Procedure: ANO RECTAL MANOMETRY;  Surgeon: Mauri Pole, MD;  Location: [facility];  Service: Endoscopy;  Laterality: N/A;  . APPENDECTOMY    . BREAST LUMPECTOMY     left  . CARDIAC CATHETERIZATION    . CATARACT EXTRACTION W/PHACO Left 03/30/2013   Procedure: CATARACT EXTRACTION PHACO AND INTRAOCULAR LENS PLACEMENT (IOC);  Surgeon: Tonny Branch, MD;  Location: [facility];  Service: Ophthalmology;  Laterality: Left;  CDE:19.28  . CATARACT EXTRACTION W/PHACO Right 04/09/2013   Procedure: CATARACT EXTRACTION PHACO AND INTRAOCULAR LENS PLACEMENT (IOC);  Surgeon: Tonny Branch, MD;  Location: [facility];  Service: Ophthalmology;  Laterality: Right;  CDE: 15.44  . CERVICAL FUSION    . CESAREAN SECTION    . CHOLECYSTECTOMY    . CYSTOCELE REPAIR    . DILATION AND CURETTAGE OF UTERUS    . EYE SURGERY Left 03-30-13   Cataract  . EYE SURGERY Right 04-09-13   Cataract  . RECTOCELE REPAIR    . THYROIDECTOMY    . TONSILLECTOMY    . TOTAL ABDOMINAL HYSTERECTOMY      Family History  Problem Relation Age of Onset  . Sarcoidosis Mother   . Diabetes Mother   . Cancer Mother     sarcoma -  Right arm amputation  . Hypertension Mother   . Lung cancer Father   . Diabetes Father   . Heart disease Father     Before age 14  . Urolithiasis Father   . Cancer Father     Lung  . Hyperlipidemia Father   . Hypertension Father   . Esophageal cancer Maternal Uncle   . Ovarian cancer Maternal Aunt   . Colon cancer Maternal Uncle   . Lung cancer Maternal Uncle   . Diabetes Maternal Uncle   . Diabetes Maternal Aunt   . Colon polyps Maternal Uncle   . Heart disease Paternal Grandmother     Social History   Social History  . Marital status: Married    Spouse name: N/A  . Number of children: 4  . Years of education: N/A   Occupational History  . Retired Marine scientist  Retired   Social History Main Topics  . Smoking status: Never Smoker  . Smokeless tobacco: Never Used  . Alcohol use No  . Drug use: No  . Sexual activity: Not on file   Other Topics Concern  . Not on file   Social History Narrative  . No narrative on file      Review of systems: Review of Systems  Constitutional: Negative for fever and chills.  HENT: Negative.   Eyes: Negative for blurred vision.  Respiratory: Negative for cough, shortness of breath and wheezing.   Cardiovascular: Negative for chest pain and palpitations.  Gastrointestinal: as per HPI Genitourinary: Negative for dysuria, urgency, frequency and hematuria.  Musculoskeletal: Positive for myalgias, back pain and joint pain.  Skin: Negative for itching and rash.  Neurological: Negative for dizziness, tremors, focal weakness, seizures and loss of consciousness.  Endo/Heme/Allergies: Positive for seasonal allergies.  Psychiatric/Behavioral: Positive for depression, negative for suicidal ideas and hallucinations.  All other systems reviewed and are negative.   Physical Exam: Vitals:   06/01/16 0957  BP: (!) 96/52  Pulse: 76   Body mass index is 31.07 kg/m. Gen:      No acute distress HEENT:  EOMI, sclera anicteric Neck:     No masses; no thyromegaly Lungs:    Clear to auscultation bilaterally; normal respiratory effort CV:         Regular rate and rhythm; no murmurs Abd:      + bowel sounds; soft, non-tender; no palpable masses, no distension Ext:    No edema; adequate peripheral perfusion Skin:      Warm and dry; no rash Neuro: alert and oriented x 3 Psych: normal mood and affect  Data Reviewed:Reviewed chart in epic   Assessment and Plan/Recommendations:  75 year old female with history of diabetes, fibromyalgia, obesity, chronic depression here with complaints of worsening epigastric pain, nausea, bloating and intermittent episodes of vomiting. She is taking aspirin 325 and also diclofenac 75 mg  twice  daily We'll schedule for EGD to evaluate for possible gastric/peptic ulcers in the setting of chronic NSAID's use Advised patient to stop taking aspirin 325 and can instead take 81 mg daily for cardioprotection if needed Avoid other NSAID's Increase PPI to twice daily, Protonix 40 mg, 30 minutes before breakfast and dinner She is also status post multiple abdominal surgeries with extensive adhesions and could have slow transit of the bowel leading to worsening constipation Low fiber diet with small frequent meals We'll do a bowel purge with MiraLAX and Gatorade After bowel purge continue Linzess 290 g daily and add MiraLAX one capful daily at bedtime Return as needed Greater than 50% of the time used for counseling as well as treatment plan and follow-up. She had multiple questions which were answered to her satisfaction  K. Denzil Magnuson , MD [phone] Mon-Fri 8a-5p [phone] after 5p, weekends, holidays  CC: Hasanaj, Samul Dada, MD

## 2016-06-13 ENCOUNTER — Telehealth: Payer: Self-pay | Admitting: Gastroenterology

## 2016-06-13 ENCOUNTER — Encounter: Payer: Self-pay | Admitting: Gastroenterology

## 2016-06-13 MED ORDER — PANTOPRAZOLE SODIUM 40 MG PO TBEC: 40.0000 mg | DELAYED_RELEASE_TABLET | Freq: Two times a day (BID) | ORAL | 3 refills | Status: DC

## 2016-06-13 NOTE — Telephone Encounter (Signed)
Med sent.

## 2016-06-25 DIAGNOSIS — E038 Other specified hypothyroidism: Secondary | ICD-10-CM | POA: Diagnosis not present

## 2016-06-25 DIAGNOSIS — I10 Essential (primary) hypertension: Secondary | ICD-10-CM | POA: Diagnosis not present

## 2016-06-25 DIAGNOSIS — R69 Illness, unspecified: Secondary | ICD-10-CM | POA: Diagnosis not present

## 2016-06-25 DIAGNOSIS — K210 Gastro-esophageal reflux disease with esophagitis: Secondary | ICD-10-CM | POA: Diagnosis not present

## 2016-06-25 DIAGNOSIS — E1121 Type 2 diabetes mellitus with diabetic nephropathy: Secondary | ICD-10-CM | POA: Diagnosis not present

## 2016-06-25 DIAGNOSIS — Z853 Personal history of malignant neoplasm of breast: Secondary | ICD-10-CM | POA: Diagnosis not present

## 2016-06-25 DIAGNOSIS — Z6828 Body mass index (BMI) 28.0-28.9, adult: Secondary | ICD-10-CM | POA: Diagnosis not present

## 2016-06-27 ENCOUNTER — Encounter: Payer: Self-pay | Admitting: Gastroenterology

## 2016-06-27 ENCOUNTER — Ambulatory Visit (AMBULATORY_SURGERY_CENTER): Payer: Medicare HMO | Admitting: Gastroenterology

## 2016-06-27 VITALS — BP 115/55 | HR 63 | Temp 98.6°F | Resp 16 | Ht 64.0 in | Wt 181.0 lb

## 2016-06-27 DIAGNOSIS — K259 Gastric ulcer, unspecified as acute or chronic, without hemorrhage or perforation: Secondary | ICD-10-CM | POA: Diagnosis not present

## 2016-06-27 DIAGNOSIS — E119 Type 2 diabetes mellitus without complications: Secondary | ICD-10-CM | POA: Diagnosis not present

## 2016-06-27 DIAGNOSIS — G4733 Obstructive sleep apnea (adult) (pediatric): Secondary | ICD-10-CM | POA: Diagnosis not present

## 2016-06-27 DIAGNOSIS — R1013 Epigastric pain: Secondary | ICD-10-CM

## 2016-06-27 DIAGNOSIS — K319 Disease of stomach and duodenum, unspecified: Secondary | ICD-10-CM | POA: Diagnosis not present

## 2016-06-27 DIAGNOSIS — K589 Irritable bowel syndrome without diarrhea: Secondary | ICD-10-CM | POA: Diagnosis not present

## 2016-06-27 LAB — GLUCOSE, CAPILLARY
GLUCOSE-CAPILLARY: 181 mg/dL — AB (ref 65–99)
Glucose-Capillary: 149 mg/dL — ABNORMAL HIGH (ref 65–99)

## 2016-06-27 MED ORDER — SODIUM CHLORIDE 0.9 % IV SOLN: 500.0000 mL | INTRAVENOUS | Status: DC

## 2016-06-27 NOTE — Progress Notes (Signed)
Called to room to assist during endoscopic procedure.  Patient ID and intended procedure confirmed with present staff. Received instructions for my participation in the procedure from the performing physician.  

## 2016-06-27 NOTE — Patient Instructions (Signed)
Small hiatal hernia. Stomach ulcer biopsies taken. GERD handout given. Result letter in your mail in 2-3 weeks.  Resume current medications. NO ibuprofen, naproxen, aleve, or any other nonsteroidal anti-inflammatory medications.     YOU HAD AN ENDOSCOPIC PROCEDURE TODAY AT [facility]:   Refer to the procedure report that was given to you for any specific questions about what was found during the examination.  If the procedure report does not answer your questions, please call your gastroenterologist to clarify.  If you requested that your care partner not be given the details of your procedure findings, then the procedure report has been included in a sealed envelope for you to review at your convenience later.  YOU SHOULD EXPECT: Some feelings of bloating in the abdomen. Passage of more gas than usual.  Walking can help get rid of the air that was put into your GI tract during the procedure and reduce the bloating. If you had a lower endoscopy (such as a colonoscopy or flexible sigmoidoscopy) you may notice spotting of blood in your stool or on the toilet paper. If you underwent a bowel prep for your procedure, you may not have a normal bowel movement for a few days.  Please Note:  You might notice some irritation and congestion in your nose or some drainage.  This is from the oxygen used during your procedure.  There is no need for concern and it should clear up in a day or so.  SYMPTOMS TO REPORT IMMEDIATELY:    Following upper endoscopy (EGD)  Vomiting of blood or coffee ground material  New chest pain or pain under the shoulder blades  Painful or persistently difficult swallowing  New shortness of breath  Fever of 100F or higher  Black, tarry-looking stools  For urgent or emergent issues, a gastroenterologist can be reached at any hour by calling [phone].   DIET:  We do recommend a small meal at first, but then you may proceed to your regular diet.  Drink  plenty of fluids but you should avoid alcoholic beverages for 24 hours.  ACTIVITY:  You should plan to take it easy for the rest of today and you should NOT DRIVE or use heavy machinery until tomorrow (because of the sedation medicines used during the test).    FOLLOW UP: Our staff will call the number listed on your records the next business day following your procedure to check on you and address any questions or concerns that you may have regarding the information given to you following your procedure. If we do not reach you, we will leave a message.  However, if you are feeling well and you are not experiencing any problems, there is no need to return our call.  We will assume that you have returned to your regular daily activities without incident.  If any biopsies were taken you will be contacted by phone or by letter within the next 1-3 weeks.  Please call us at [phone] if you have not heard about the biopsies in 3 weeks.    SIGNATURES/CONFIDENTIALITY: You and/or your care partner have signed paperwork which will be entered into your electronic medical record.  These signatures attest to the fact that that the information above on your After Visit Summary has been reviewed and is understood.  Full responsibility of the confidentiality of this discharge information lies with you and/or your care-partner.

## 2016-06-27 NOTE — Op Note (Addendum)
[facility] Patient Name: Jeanne Kim Procedure Date: 06/27/2016 10:30 AM MRN: [id number] Endoscopist: Mauri Pole , MD Age: 75 Referring MD:  Date of Birth: Dec 28, 1940 Gender: Female Account #: [id number] Procedure:                Upper GI endoscopy Indications:              New-onset upper abdominal symptoms in patient older                            than 50 years, Dyspepsia Medicines:                Monitored Anesthesia Care Procedure:                Pre-Anesthesia Assessment:                           - Prior to the procedure, a History and Physical                            was performed, and patient medications and                            allergies were reviewed. The patient's tolerance of                            previous anesthesia was also reviewed. The risks                            and benefits of the procedure and the sedation                            options and risks were discussed with the patient.                            All questions were answered, and informed consent                            was obtained. Prior Anticoagulants: The patient                            last took aspirin 1 day prior to the procedure. ASA                            Grade Assessment: III - A patient with severe                            systemic disease. After reviewing the risks and                            benefits, the patient was deemed in satisfactory                            condition to undergo the procedure.  After obtaining informed consent, the endoscope was                            passed under direct vision. Throughout the                            procedure, the patient's blood pressure, pulse, and                            oxygen saturations were monitored continuously. The                            Model GIF-[id number]) scope was introduced                            through the mouth, and advanced  to the second part                            of duodenum. The upper GI endoscopy was                            accomplished without difficulty. The patient                            tolerated the procedure well. Scope In: Scope Out: Findings:                 The esophagus was normal.                           A small hiatal hernia was present.                           One non-bleeding cratered gastric ulcer with no                            stigmata of bleeding was found in the prepyloric                            region of the stomach. The lesion was 6 mm in                            largest dimension. Biopsies were taken with a cold                            forceps for histology. Biopsies were taken with a                            cold forceps for Helicobacter pylori testing using                            CLOtest.                           The examined duodenum was normal. Complications:  No immediate complications. Estimated Blood Loss:     Estimated blood loss was minimal. Impression:               - Normal esophagus.                           - Small hiatal hernia.                           - Non-bleeding gastric ulcer with no stigmata of                            bleeding. Biopsied.                           - Normal examined duodenum. Recommendation:           - Patient has a contact number available for                            emergencies. The signs and symptoms of potential                            delayed complications were discussed with the                            patient. Return to normal activities tomorrow.                            Written discharge instructions were provided to the                            patient.                           - Resume previous diet.                           - Continue present medications.                           - No ibuprofen, naproxen, or other non-steroidal                             anti-inflammatory drugs.                           - Await pathology results.                           - Repeat upper endoscopy for surveillance based on                            pathology results.                           - Return to GI clinic PRN. Mauri Pole, MD 06/27/2016 10:47:54 AM This report has been signed electronically.

## 2016-06-28 ENCOUNTER — Telehealth: Payer: Self-pay | Admitting: *Deleted

## 2016-06-28 LAB — HELICOBACTER PYLORI SCREEN-BIOPSY: UREASE: NEGATIVE

## 2016-06-28 NOTE — Telephone Encounter (Signed)
  Follow up Call-  Call back number 06/27/2016  Post procedure Call Back phone  # [phone]  Permission to leave phone message Yes  Some recent data might be hidden     No answer, left message

## 2016-07-02 DIAGNOSIS — I10 Essential (primary) hypertension: Secondary | ICD-10-CM | POA: Diagnosis not present

## 2016-07-02 DIAGNOSIS — Z853 Personal history of malignant neoplasm of breast: Secondary | ICD-10-CM | POA: Diagnosis not present

## 2016-07-02 DIAGNOSIS — Z08 Encounter for follow-up examination after completed treatment for malignant neoplasm: Secondary | ICD-10-CM | POA: Diagnosis not present

## 2016-07-02 DIAGNOSIS — R928 Other abnormal and inconclusive findings on diagnostic imaging of breast: Secondary | ICD-10-CM | POA: Diagnosis not present

## 2016-07-02 DIAGNOSIS — C50912 Malignant neoplasm of unspecified site of left female breast: Secondary | ICD-10-CM | POA: Diagnosis not present

## 2016-07-02 DIAGNOSIS — E785 Hyperlipidemia, unspecified: Secondary | ICD-10-CM | POA: Diagnosis not present

## 2016-07-02 DIAGNOSIS — M797 Fibromyalgia: Secondary | ICD-10-CM | POA: Diagnosis not present

## 2016-07-02 DIAGNOSIS — E119 Type 2 diabetes mellitus without complications: Secondary | ICD-10-CM | POA: Diagnosis not present

## 2016-07-02 DIAGNOSIS — C50919 Malignant neoplasm of unspecified site of unspecified female breast: Secondary | ICD-10-CM | POA: Diagnosis not present

## 2016-07-02 DIAGNOSIS — Z23 Encounter for immunization: Secondary | ICD-10-CM | POA: Diagnosis not present

## 2016-07-02 DIAGNOSIS — I779 Disorder of arteries and arterioles, unspecified: Secondary | ICD-10-CM | POA: Diagnosis not present

## 2016-07-02 DIAGNOSIS — R922 Inconclusive mammogram: Secondary | ICD-10-CM | POA: Diagnosis not present

## 2016-07-02 DIAGNOSIS — I2510 Atherosclerotic heart disease of native coronary artery without angina pectoris: Secondary | ICD-10-CM | POA: Diagnosis not present

## 2016-07-02 DIAGNOSIS — K581 Irritable bowel syndrome with constipation: Secondary | ICD-10-CM | POA: Diagnosis not present

## 2016-07-02 DIAGNOSIS — K219 Gastro-esophageal reflux disease without esophagitis: Secondary | ICD-10-CM | POA: Diagnosis not present

## 2016-07-09 DIAGNOSIS — E038 Other specified hypothyroidism: Secondary | ICD-10-CM | POA: Diagnosis not present

## 2016-07-09 DIAGNOSIS — E1121 Type 2 diabetes mellitus with diabetic nephropathy: Secondary | ICD-10-CM | POA: Diagnosis not present

## 2016-07-09 DIAGNOSIS — R69 Illness, unspecified: Secondary | ICD-10-CM | POA: Diagnosis not present

## 2016-07-09 DIAGNOSIS — K210 Gastro-esophageal reflux disease with esophagitis: Secondary | ICD-10-CM | POA: Diagnosis not present

## 2016-07-09 DIAGNOSIS — I10 Essential (primary) hypertension: Secondary | ICD-10-CM | POA: Diagnosis not present

## 2016-07-11 ENCOUNTER — Encounter: Payer: Self-pay | Admitting: Gastroenterology

## 2016-07-19 ENCOUNTER — Encounter: Payer: Self-pay | Admitting: Family

## 2016-07-24 ENCOUNTER — Ambulatory Visit (HOSPITAL_COMMUNITY)
Admission: RE | Admit: 2016-07-24 | Discharge: 2016-07-24 | Disposition: A | Discharge status: Home / Self Care | Payer: Medicare HMO | Source: Ambulatory Visit | Attending: Family | Admitting: Family

## 2016-07-24 ENCOUNTER — Encounter: Payer: Self-pay | Admitting: Family

## 2016-07-24 ENCOUNTER — Ambulatory Visit (INDEPENDENT_AMBULATORY_CARE_PROVIDER_SITE_OTHER): Payer: Medicare HMO | Admitting: Family

## 2016-07-24 VITALS — BP 94/52 | HR 63 | Temp 96.6°F | Resp 14 | Ht 65.0 in | Wt 179.0 lb

## 2016-07-24 DIAGNOSIS — I771 Stricture of artery: Secondary | ICD-10-CM | POA: Diagnosis not present

## 2016-07-24 DIAGNOSIS — I6523 Occlusion and stenosis of bilateral carotid arteries: Secondary | ICD-10-CM

## 2016-07-24 LAB — VAS US CAROTID
LCCADDIAS: -12 cm/s
LCCADSYS: -73 cm/s
LCCAPDIAS: 15 cm/s
LCCAPSYS: 82 cm/s
LEFT ECA DIAS: -9 cm/s
LEFT VERTEBRAL DIAS: 13 cm/s
LICADSYS: -93 cm/s
Left ICA dist dias: -25 cm/s
RCCAPSYS: -112 cm/s
RIGHT CCA MID DIAS: 16 cm/s
RIGHT ECA DIAS: -6 cm/s
RIGHT VERTEBRAL DIAS: 11 cm/s
Right CCA prox dias: -10 cm/s
Right cca dist sys: -90 cm/s

## 2016-07-24 NOTE — Patient Instructions (Signed)
Stroke Prevention Some medical conditions and behaviors are associated with an increased chance of having a stroke. You may prevent a stroke by making healthy choices and managing medical conditions. HOW CAN I REDUCE MY RISK OF HAVING A STROKE?   Stay physically active. Get at least 30 minutes of activity on most or all days.  Do not smoke. It may also be helpful to avoid exposure to secondhand smoke.  Limit alcohol use. Moderate alcohol use is considered to be:  No more than 2 drinks per day for men.  No more than 1 drink per day for nonpregnant women.  Eat healthy foods. This involves:  Eating 5 or more servings of fruits and vegetables a day.  Making dietary changes that address high blood pressure (hypertension), high cholesterol, diabetes, or obesity.  Manage your cholesterol levels.  Making food choices that are high in fiber and low in saturated fat, trans fat, and cholesterol may control cholesterol levels.  Take any prescribed medicines to control cholesterol as directed by your health care provider.  Manage your diabetes.  Controlling your carbohydrate and sugar intake is recommended to manage diabetes.  Take any prescribed medicines to control diabetes as directed by your health care provider.  Control your hypertension.  Making food choices that are low in salt (sodium), saturated fat, trans fat, and cholesterol is recommended to manage hypertension.  Ask your health care provider if you need treatment to lower your blood pressure. Take any prescribed medicines to control hypertension as directed by your health care provider.  If you are 18-39 years of age, have your blood pressure checked every 3-5 years. If you are 40 years of age or older, have your blood pressure checked every year.  Maintain a healthy weight.  Reducing calorie intake and making food choices that are low in sodium, saturated fat, trans fat, and cholesterol are recommended to manage  weight.  Stop drug abuse.  Avoid taking birth control pills.  Talk to your health care provider about the risks of taking birth control pills if you are over 35 years old, smoke, get migraines, or have ever had a blood clot.  Get evaluated for sleep disorders (sleep apnea).  Talk to your health care provider about getting a sleep evaluation if you snore a lot or have excessive sleepiness.  Take medicines only as directed by your health care provider.  For some people, aspirin or blood thinners (anticoagulants) are helpful in reducing the risk of forming abnormal blood clots that can lead to stroke. If you have the irregular heart rhythm of atrial fibrillation, you should be on a blood thinner unless there is a good reason you cannot take them.  Understand all your medicine instructions.  Make sure that other conditions (such as anemia or atherosclerosis) are addressed. SEEK IMMEDIATE MEDICAL CARE IF:   You have sudden weakness or numbness of the face, arm, or leg, especially on one side of the body.  Your face or eyelid droops to one side.  You have sudden confusion.  You have trouble speaking (aphasia) or understanding.  You have sudden trouble seeing in one or both eyes.  You have sudden trouble walking.  You have dizziness.  You have a loss of balance or coordination.  You have a sudden, severe headache with no known cause.  You have new chest pain or an irregular heartbeat. Any of these symptoms may represent a serious problem that is an emergency. Do not wait to see if the symptoms will   go away. Get medical help at once. Call your local emergency services (911 in [place] Do not drive yourself to the hospital.   This information is not intended to replace advice given to you by your health care provider. Make sure you discuss any questions you have with your health care provider.   Document Released: 11/01/2004 Document Revised: 10/15/2014 Document Reviewed:  03/27/2013 Elsevier Interactive Patient Education 2016 Elsevier Inc.  

## 2016-07-24 NOTE — Progress Notes (Signed)
Chief Complaint: Follow up Extracranial Carotid Artery Stenosis   History of Present Illness  Jeanne Kim is a 75 y.o. female patient of Dr. Donnetta Hutching who returns for follow up of her mild to moderate left carotid stenosis with elevated velocities related to tortuosity of the vessel.   Patient has not had previous carotid artery intervention.  The patient denies any history of TIA or stroke symptoms.Specifically the patient denies any history of amaurosis fugax or monocular blindness, unilateral facial drooping, hemiplegia, or receptive or expressive aphasia.  Pt states she has plaque and kinking in her coronary arteries, she is unsure if she has had an MI.  Has chronic c-spine issues, seeing ortho for this, has intermittent tingling, numbness, weakness in her left arm. She also has known lumbar disc disease which affects her left leg more so than right. She has fibromyalgia, denies non healing wounds. She has trouble with her balance.   Pt Diabetic: Yes, pt states her FBS is about 180, does not recall her last A1C, she sees an endocrinologist at [facility].   Pt smoker: non-smoker  Pt meds include: Statin : Yes ASA: Yes Other anticoagulants/antiplatelets: no    Past Medical History:  Diagnosis Date  . Allergy   . Anemia   . Anginal pain ([place])   . Anxiety   . Arthritis   . Blood transfusion without reported diagnosis   . Breast cancer ([place])   . Carotid artery plaque    bilat  . Cataract   . Colon polyps    adenomatous  . Concussion 02/2016   Driving restrictions for 6w  . Coronary heart disease   . Depression   . Diverticulosis   . Dizziness   . Esophageal reflux   . Esophageal stricture   . Fibromyalgia   . Gallstones   . Hiatal hernia   . Hypercholesterolemia   . Hypertension   . Hypothyroidism   . IBS (irritable bowel syndrome)   . Kidney stones   . Pneumonia    recently, just finished ABO  . Pneumonia   . PONV (postoperative nausea and  vomiting)   . Sleep apnea    CPAP  . Thyroid disease 2002  . Type II or unspecified type diabetes mellitus without mention of complication, not stated as uncontrolled   . Unspecified gastritis and gastroduodenitis without mention of hemorrhage     Social History Social History  Substance Use Topics  . Smoking status: Never Smoker  . Smokeless tobacco: Never Used  . Alcohol use No    Family History Family History  Problem Relation Age of Onset  . Sarcoidosis Mother   . Diabetes Mother   . Cancer Mother     sarcoma -  Right arm amputation  . Hypertension Mother   . Lung cancer Father   . Diabetes Father   . Heart disease Father     Before age 42  . Urolithiasis Father   . Cancer Father     Lung  . Hyperlipidemia Father   . Hypertension Father   . Esophageal cancer Maternal Uncle   . Ovarian cancer Maternal Aunt   . Colon cancer Maternal Uncle   . Lung cancer Maternal Uncle   . Diabetes Maternal Uncle   . Diabetes Maternal Aunt   . Colon polyps Maternal Uncle   . Heart disease Paternal Grandmother     Surgical History Past Surgical History:  Procedure Laterality Date  . ANAL RECTAL MANOMETRY N/A 08/05/2015   Procedure: Thalia Party  RECTAL MANOMETRY;  Surgeon: Mauri Pole, MD;  Location: [facility];  Service: Endoscopy;  Laterality: N/A;  . APPENDECTOMY    . BREAST LUMPECTOMY     left  . CARDIAC CATHETERIZATION    . CATARACT EXTRACTION W/PHACO Left 03/30/2013   Procedure: CATARACT EXTRACTION PHACO AND INTRAOCULAR LENS PLACEMENT (IOC);  Surgeon: Tonny Branch, MD;  Location: [facility];  Service: Ophthalmology;  Laterality: Left;  CDE:19.28  . CATARACT EXTRACTION W/PHACO Right 04/09/2013   Procedure: CATARACT EXTRACTION PHACO AND INTRAOCULAR LENS PLACEMENT (IOC);  Surgeon: Tonny Branch, MD;  Location: [facility];  Service: Ophthalmology;  Laterality: Right;  CDE: 15.44  . CERVICAL FUSION    . CESAREAN SECTION    . CHOLECYSTECTOMY    . CYSTOCELE REPAIR    . DILATION AND  CURETTAGE OF UTERUS    . EYE SURGERY Left 03-30-13   Cataract  . EYE SURGERY Right 04-09-13   Cataract  . RECTOCELE REPAIR    . THYROIDECTOMY    . TONSILLECTOMY    . TOTAL ABDOMINAL HYSTERECTOMY      Allergies  Allergen Reactions  . Codeine Hives  . Sulfonamide Derivatives Hives    Current Outpatient Prescriptions  Medication Sig Dispense Refill  . aspirin 81 MG chewable tablet Chew 81 mg by mouth daily.    Marland Kitchen atorvastatin (LIPITOR) 20 MG tablet Take 1 tablet (20 mg total) by mouth daily. 90 tablet 3  . BELSOMRA 10 MG TABS Take 1 tablet by mouth daily.   0  . calcium-vitamin D (OSCAL WITH D) 500-200 MG-UNIT per tablet Take 1 tablet by mouth 2 (two) times daily.    . Coenzyme Q10 (CO Q-10) 100 MG CAPS Take 100 mg by mouth daily.    . cyanocobalamin (,VITAMIN B-12,) 1000 MCG/ML injection Inject 1 mL (1,000 mcg total) into the muscle once. 1 mL 12  . DULoxetine (CYMBALTA) 60 MG capsule Take 60 mg by mouth daily.    Marland Kitchen EPINEPHrine 0.3 mg/0.3 mL IJ SOAJ injection Frequency:PHARMDIR   Dosage:0.0     Instructions:  Note:self inject in the event of allergic reaction, then proceed immediately to the nearest Emergency Room. Dose: 1 INJECTION    . fexofenadine (ALLEGRA) 30 MG tablet Take 30 mg by mouth daily.    . fluticasone (FLONASE) 50 MCG/ACT nasal spray Place 2 sprays into the nose daily.    . furosemide (LASIX) 40 MG tablet Take 1 pill daily for 3 days then as needed for swelling 30 tablet 6  . gabapentin (NEURONTIN) 300 MG capsule Take 300 mg by mouth at bedtime.    Marland Kitchen glipiZIDE (GLUCOTROL) 10 MG tablet Take 10 mg by mouth daily before breakfast.    . Insulin Lispro Prot & Lispro (HUMALOG MIX 75/25 KWIKPEN) (75-25) 100 UNIT/ML Kwikpen Inject into the skin.    Marland Kitchen LEVEMIR FLEXTOUCH 100 UNIT/ML Pen 20 Units daily at 10 pm.     . levothyroxine (SYNTHROID, LEVOTHROID) 125 MCG tablet Take 1 tablet by mouth daily.    . Linaclotide (LINZESS) 290 MCG CAPS capsule Take 1 capsule (290 mcg total) by  mouth daily. 30 capsule 6  . Liraglutide (VICTOZA) 18 MG/3ML SOPN INJECT 0.2 MLS (1.2 MG TOTAL) INTO THE SKIN DAILY.    . metFORMIN (GLUCOPHAGE) 1000 MG tablet Take 1,000 mg by mouth 2 (two) times daily with a meal.    . methocarbamol (ROBAXIN) 750 MG tablet Take 750 mg by mouth every 8 (eight) hours.  1  . metoprolol succinate (TOPROL-XL) 50 MG  24 hr tablet Take 1 tablet (50 mg total) by mouth at bedtime. 90 tablet 3  . midodrine (PROAMATINE) 2.5 MG tablet Take 2.5 mg by mouth 3 (three) times daily.  0  . montelukast (SINGULAIR) 10 MG tablet Take 10 mg by mouth at bedtime.    . Needles & Syringes MISC 25G 5/8 inch needle on 3 ml syringe-Use once monthly for B12 injections 12 each 0  . nitroGLYCERIN (NITROSTAT) 0.4 MG SL tablet Place 0.4 mg under the tongue.    . Omega-3 Fatty Acids (FISH OIL PO) Take by mouth.    Glory Rosebush VERIO test strip     . pantoprazole (PROTONIX) 40 MG tablet Take 1 tablet (40 mg total) by mouth 2 (two) times daily. 60 tablet 3  . potassium chloride (K-DUR,KLOR-CON) 10 MEQ tablet Take 10 mEq by mouth once. Take one tablet every time you take lasix    . ramipril (ALTACE) 5 MG capsule Take 1 capsule (5 mg total) by mouth at bedtime. 90 capsule 3  . triamterene-hydrochlorothiazide (MAXZIDE-25) 37.5-25 MG per tablet Take 1 tablet by mouth daily.     Marland Kitchen VAGIFEM 10 MCG TABS vaginal tablet Place 1 tablet vaginally once a week.   0   Current Facility-Administered Medications  Medication Dose Route Frequency Provider Last Rate Last Dose  . 0.9 %  sodium chloride infusion  500 mL Intravenous Continuous Mauri Pole, MD        Review of Systems : See HPI for pertinent positives and negatives.  Physical Examination  Vitals:   07/24/16 1053  BP: (!) 94/52  Pulse: 63  Resp: 14  Temp: (!) 96.6 F (35.9 C)  TempSrc: Oral  SpO2: 96%  Weight: 179 lb (81.2 kg)  Height: 5\' 5"  (1.651 m)   Body mass index is 29.79 kg/m.  General: WDWN obese female in NAD GAIT: slow,  deliberate Eyes: PERRLA Pulmonary: Non-labored, CTAB, no rales, no rhonchi, & no wheezing.  Cardiac: regular rhythm, no detected murmur.  VASCULAR EXAM Carotid Bruits Left Right   Negative Negative    Radial pulses are 2+ palpable and equal.      LE Pulses LEFT RIGHT   POPLITEAL not palpable  not palpable   POSTERIOR TIBIAL  not palpable  not  palpable    DORSALIS PEDIS  ANTERIOR TIBIAL palpable  palpable     Gastrointestinal: soft, nontender, BS WNL, no r/g,no palpable masses.  Musculoskeletal: No muscle atrophy/wasting. M/S 5/5 in upper extremities, 4/5 in lower extremities, Extremities without ischemic changes.  Neurologic: A&O X 3; Appropriate Affect ; SENSATION equal and symmetric except slightly diminished in left fingers and toes compared to right;  Speech is normal CN 2-12 intact, Motor exam as listed above.    Assessment: Jeanne Kim is a 75 y.o. female who has no history of stroke or TIA.  DATA Today's carotid duplex suggests minimal bilateral ICA stenoses. Left internal carotid artery vessel tortuousity/kink noted at the mid segment.  Vertebral arteries are antegrade.   Essentially unchanged since previous study on 07/19/15.    Plan: Follow-up in 18 months with Carotid Duplex scan.   I discussed in depth with the patient the nature of atherosclerosis, and emphasized the importance of maximal medical management including strict control of blood pressure, blood glucose, and lipid levels, obtaining regular exercise, and continued cessation of smoking.  The patient is aware that without maximal medical management the underlying atherosclerotic disease process will progress, limiting the benefit of any interventions. The  patient was given information about stroke prevention and  what symptoms should prompt the patient to seek immediate medical care. Thank you for allowing [place] to participate in this patient's care.  Clemon Chambers, RN, MSN, FNP-C [facility] Office: [phone]  Clinic Physician: Early  07/24/16 11:15 AM

## 2016-08-01 DIAGNOSIS — D1801 Hemangioma of skin and subcutaneous tissue: Secondary | ICD-10-CM | POA: Diagnosis not present

## 2016-08-01 DIAGNOSIS — L570 Actinic keratosis: Secondary | ICD-10-CM | POA: Diagnosis not present

## 2016-08-01 DIAGNOSIS — L821 Other seborrheic keratosis: Secondary | ICD-10-CM | POA: Diagnosis not present

## 2016-08-08 DIAGNOSIS — I10 Essential (primary) hypertension: Secondary | ICD-10-CM | POA: Diagnosis not present

## 2016-08-08 DIAGNOSIS — K210 Gastro-esophageal reflux disease with esophagitis: Secondary | ICD-10-CM | POA: Diagnosis not present

## 2016-08-08 DIAGNOSIS — E038 Other specified hypothyroidism: Secondary | ICD-10-CM | POA: Diagnosis not present

## 2016-08-08 DIAGNOSIS — E1121 Type 2 diabetes mellitus with diabetic nephropathy: Secondary | ICD-10-CM | POA: Diagnosis not present

## 2016-08-08 DIAGNOSIS — R69 Illness, unspecified: Secondary | ICD-10-CM | POA: Diagnosis not present

## 2016-08-28 ENCOUNTER — Other Ambulatory Visit: Payer: Self-pay | Admitting: Gastroenterology

## 2016-08-28 NOTE — Telephone Encounter (Signed)
Ok to refill, please schedule patient for office visit if she has ongoing GI issue and will need to check B12 level. She can get B12 level through PMD office and injection if has no active GI issues

## 2016-08-28 NOTE — Telephone Encounter (Signed)
Dr Silverio Decamp patient needs a refill of B12. Does she need to continue and can she have a refill? Looks like last labs was 07/2015

## 2016-08-29 NOTE — Telephone Encounter (Signed)
L/M for patient to call the office and schedule a follow up for b12 if she wants to continue to get it from [place].. Will need a follow up appt for any further refills Sent 1  refill to her pharmacy

## 2016-09-20 DIAGNOSIS — E038 Other specified hypothyroidism: Secondary | ICD-10-CM | POA: Diagnosis not present

## 2016-09-20 DIAGNOSIS — I10 Essential (primary) hypertension: Secondary | ICD-10-CM | POA: Diagnosis not present

## 2016-09-20 DIAGNOSIS — R69 Illness, unspecified: Secondary | ICD-10-CM | POA: Diagnosis not present

## 2016-09-20 DIAGNOSIS — K210 Gastro-esophageal reflux disease with esophagitis: Secondary | ICD-10-CM | POA: Diagnosis not present

## 2016-09-20 DIAGNOSIS — E1121 Type 2 diabetes mellitus with diabetic nephropathy: Secondary | ICD-10-CM | POA: Diagnosis not present

## 2016-09-21 ENCOUNTER — Other Ambulatory Visit: Payer: Self-pay

## 2016-09-21 MED ORDER — LINACLOTIDE 290 MCG PO CAPS: 290.0000 ug | ORAL_CAPSULE | Freq: Every day | ORAL | 1 refills | Status: DC

## 2016-09-24 ENCOUNTER — Other Ambulatory Visit: Payer: Self-pay

## 2016-09-24 MED ORDER — CYANOCOBALAMIN 1000 MCG/ML IJ SOLN: INTRAMUSCULAR | 0 refills | Status: DC

## 2016-09-24 NOTE — Telephone Encounter (Signed)
B12 refill for 90 days sent in as requested by pharmacy.

## 2016-09-27 DIAGNOSIS — E119 Type 2 diabetes mellitus without complications: Secondary | ICD-10-CM | POA: Diagnosis not present

## 2016-09-27 DIAGNOSIS — E1165 Type 2 diabetes mellitus with hyperglycemia: Secondary | ICD-10-CM | POA: Diagnosis not present

## 2016-09-27 DIAGNOSIS — Z794 Long term (current) use of insulin: Secondary | ICD-10-CM | POA: Diagnosis not present

## 2016-09-27 DIAGNOSIS — Z7984 Long term (current) use of oral hypoglycemic drugs: Secondary | ICD-10-CM | POA: Diagnosis not present

## 2016-10-02 DIAGNOSIS — E1121 Type 2 diabetes mellitus with diabetic nephropathy: Secondary | ICD-10-CM | POA: Diagnosis not present

## 2016-10-02 DIAGNOSIS — E038 Other specified hypothyroidism: Secondary | ICD-10-CM | POA: Diagnosis not present

## 2016-10-02 DIAGNOSIS — K210 Gastro-esophageal reflux disease with esophagitis: Secondary | ICD-10-CM | POA: Diagnosis not present

## 2016-10-02 DIAGNOSIS — R69 Illness, unspecified: Secondary | ICD-10-CM | POA: Diagnosis not present

## 2016-10-02 DIAGNOSIS — I10 Essential (primary) hypertension: Secondary | ICD-10-CM | POA: Diagnosis not present

## 2016-10-02 DIAGNOSIS — Z6829 Body mass index (BMI) 29.0-29.9, adult: Secondary | ICD-10-CM | POA: Diagnosis not present

## 2016-10-04 DIAGNOSIS — R69 Illness, unspecified: Secondary | ICD-10-CM | POA: Diagnosis not present

## 2016-10-06 ENCOUNTER — Other Ambulatory Visit: Payer: Self-pay | Admitting: Gastroenterology

## 2016-10-12 DIAGNOSIS — E1121 Type 2 diabetes mellitus with diabetic nephropathy: Secondary | ICD-10-CM | POA: Diagnosis not present

## 2016-10-12 DIAGNOSIS — I10 Essential (primary) hypertension: Secondary | ICD-10-CM | POA: Diagnosis not present

## 2016-10-12 DIAGNOSIS — E038 Other specified hypothyroidism: Secondary | ICD-10-CM | POA: Diagnosis not present

## 2016-10-12 DIAGNOSIS — R69 Illness, unspecified: Secondary | ICD-10-CM | POA: Diagnosis not present

## 2016-10-12 DIAGNOSIS — K210 Gastro-esophageal reflux disease with esophagitis: Secondary | ICD-10-CM | POA: Diagnosis not present

## 2016-10-31 ENCOUNTER — Encounter: Payer: Self-pay | Admitting: Gastroenterology

## 2016-12-19 ENCOUNTER — Other Ambulatory Visit: Payer: Self-pay | Admitting: Internal Medicine

## 2016-12-19 DIAGNOSIS — M545 Low back pain: Secondary | ICD-10-CM

## 2016-12-27 ENCOUNTER — Ambulatory Visit
Admission: RE | Admit: 2016-12-27 | Discharge: 2016-12-27 | Disposition: A | Discharge status: Home / Self Care | Payer: Medicare HMO | Source: Ambulatory Visit | Attending: Internal Medicine | Admitting: Internal Medicine

## 2016-12-27 DIAGNOSIS — M545 Low back pain: Secondary | ICD-10-CM | POA: Diagnosis not present

## 2016-12-27 MED ORDER — IOPAMIDOL (ISOVUE-M 200) INJECTION 41%
1.0000 mL | Freq: Once | INTRAMUSCULAR | Status: AC
  Administered 2016-12-27: 1 mL via EPIDURAL

## 2016-12-27 MED ORDER — METHYLPREDNISOLONE ACETATE 40 MG/ML INJ SUSP (RADIOLOG
120.0000 mg | Freq: Once | INTRAMUSCULAR | Status: AC
  Administered 2016-12-27: 120 mg via EPIDURAL

## 2016-12-27 NOTE — Discharge Instructions (Signed)

## 2017-01-12 ENCOUNTER — Other Ambulatory Visit: Payer: Self-pay | Admitting: Gastroenterology

## 2017-01-14 NOTE — Telephone Encounter (Signed)
Can I refill B12? Looks like she has not had B12 checked since 2016

## 2017-01-15 ENCOUNTER — Encounter: Payer: Self-pay | Admitting: Cardiovascular Disease

## 2017-01-16 ENCOUNTER — Ambulatory Visit (INDEPENDENT_AMBULATORY_CARE_PROVIDER_SITE_OTHER): Payer: Medicare HMO | Admitting: Cardiovascular Disease

## 2017-01-16 ENCOUNTER — Encounter: Payer: Self-pay | Admitting: Cardiovascular Disease

## 2017-01-16 VITALS — BP 114/52 | HR 70 | Ht 65.0 in | Wt 178.4 lb

## 2017-01-16 DIAGNOSIS — I25118 Atherosclerotic heart disease of native coronary artery with other forms of angina pectoris: Secondary | ICD-10-CM | POA: Diagnosis not present

## 2017-01-16 DIAGNOSIS — I10 Essential (primary) hypertension: Secondary | ICD-10-CM

## 2017-01-16 DIAGNOSIS — R42 Dizziness and giddiness: Secondary | ICD-10-CM

## 2017-01-16 DIAGNOSIS — R55 Syncope and collapse: Secondary | ICD-10-CM

## 2017-01-16 NOTE — Patient Instructions (Signed)
Medication Instructions:  Your physician has recommended you make the following change in your medication:  1. STOP Maxzide  Labwork: No new orders.   Testing/Procedures: No new orders.   Follow-Up: Your physician wants you to follow-up in: 1 YEAR with Dr Burt Knack.  You will receive a reminder letter in the mail two months in advance. If you don't receive a letter, please call our office to schedule the follow-up appointment.   Any Other Special Instructions Will Be Listed Below (If Applicable).     If you need a refill on your cardiac medications before your next appointment, please call your pharmacy.

## 2017-01-16 NOTE — Progress Notes (Signed)
Cardiology Office Note Date:  01/18/2017   ID:  Jeanne Kim, DOB 05-Apr-1941, MRN [id number]  PCP:  Neale Burly, MD  Cardiologist:  Sherren Mocha, MD    Chief Complaint  Patient presents with  . Coronary Artery Disease     History of Present Illness: Jeanne Kim is a 76 y.o. female who presents for follow-up of nonobstructive CAD. She was last seen one year ago. She underwent heart catheterization in 2006 demonstrating mild nonobstructive CAD with 20-30% stenosis of the left main and LAD. There is no other significant disease noted. Left ventricular function was normal. She has carotid stenosis which has been graded at mild to moderate and she is followed by Dr. Donnetta Hutching.  She feels an 'elephant on the chest' when she wakes up in the mornings. Symptoms resolve when she gets up and moves around. This has been present for several weeks. No burping or belching associated with this no associated dyspnea. Also has episodes of feeling 'hot, sweaty, nauseated, then lightheaded.' These episodes generally occur when she is up on her feet. They are not related to blood sugar. At times she has lay down in the floor to cool down.    Past Medical History:  Diagnosis Date  . Allergy   . Anemia   . Anginal pain ([place])   . Anxiety   . Arthritis   . Blood transfusion without reported diagnosis   . Breast cancer ([place])   . Carotid artery plaque    bilat  . Cataract   . Colon polyps    adenomatous  . Concussion 02/2016   Driving restrictions for 6w  . Coronary heart disease   . Depression   . Diverticulosis   . Dizziness   . Esophageal reflux   . Esophageal stricture   . Fibromyalgia   . Gallstones   . Hiatal hernia   . Hypercholesterolemia   . Hypertension   . Hypothyroidism   . IBS (irritable bowel syndrome)   . Kidney stones   . Pneumonia    recently, just finished ABO  . Pneumonia   . PONV (postoperative nausea and vomiting)   . Sleep apnea    CPAP  . Thyroid disease  2002  . Type II or unspecified type diabetes mellitus without mention of complication, not stated as uncontrolled   . Unspecified gastritis and gastroduodenitis without mention of hemorrhage     Past Surgical History:  Procedure Laterality Date  . ANAL RECTAL MANOMETRY N/A 08/05/2015   Procedure: ANO RECTAL MANOMETRY;  Surgeon: Mauri Pole, MD;  Location: [facility];  Service: Endoscopy;  Laterality: N/A;  . APPENDECTOMY    . BREAST LUMPECTOMY     left  . CARDIAC CATHETERIZATION    . CATARACT EXTRACTION W/PHACO Left 03/30/2013   Procedure: CATARACT EXTRACTION PHACO AND INTRAOCULAR LENS PLACEMENT (IOC);  Surgeon: Tonny Branch, MD;  Location: [facility];  Service: Ophthalmology;  Laterality: Left;  CDE:19.28  . CATARACT EXTRACTION W/PHACO Right 04/09/2013   Procedure: CATARACT EXTRACTION PHACO AND INTRAOCULAR LENS PLACEMENT (IOC);  Surgeon: Tonny Branch, MD;  Location: [facility];  Service: Ophthalmology;  Laterality: Right;  CDE: 15.44  . CERVICAL FUSION    . CESAREAN SECTION    . CHOLECYSTECTOMY    . CYSTOCELE REPAIR    . DILATION AND CURETTAGE OF UTERUS    . EYE SURGERY Left 03-30-13   Cataract  . EYE SURGERY Right 04-09-13   Cataract  . RECTOCELE REPAIR    .  THYROIDECTOMY    . TONSILLECTOMY    . TOTAL ABDOMINAL HYSTERECTOMY      Current Outpatient Prescriptions  Medication Sig Dispense Refill  . aspirin 81 MG chewable tablet Chew 81 mg by mouth daily.    Marland Kitchen atorvastatin (LIPITOR) 20 MG tablet Take 1 tablet (20 mg total) by mouth daily. 90 tablet 3  . BELSOMRA 10 MG TABS Take 1 tablet by mouth daily.   0  . calcium-vitamin D (OSCAL WITH D) 500-200 MG-UNIT per tablet Take 1 tablet by mouth 2 (two) times daily.    . Coenzyme Q10 (CO Q-10) 100 MG CAPS Take 100 mg by mouth daily.    . cyanocobalamin (,VITAMIN B-12,) 1000 MCG/ML injection INJECT 1 ML INTO MUSCLE ONCE A MONTH 3 mL 0  . DULoxetine (CYMBALTA) 60 MG capsule Take 60 mg by mouth daily.    Marland Kitchen EPINEPHrine 0.3 mg/0.3 mL IJ SOAJ  injection Frequency:PHARMDIR   Dosage:0.0     Instructions:  Note:self inject in the event of allergic reaction, then proceed immediately to the nearest Emergency Room. Dose: 1 INJECTION    . fexofenadine (ALLEGRA) 30 MG tablet Take 30 mg by mouth daily.    . fluticasone (FLONASE) 50 MCG/ACT nasal spray Place 2 sprays into the nose daily.    . furosemide (LASIX) 40 MG tablet Take 1 pill daily for 3 days then as needed for swelling 30 tablet 6  . gabapentin (NEURONTIN) 300 MG capsule Take 300 mg by mouth at bedtime.    Marland Kitchen glipiZIDE (GLUCOTROL) 10 MG tablet Take 10 mg by mouth daily before breakfast.    . Insulin Lispro Prot & Lispro (HUMALOG MIX 75/25 KWIKPEN) (75-25) 100 UNIT/ML Kwikpen Inject 4-5 Units into the skin 3 (three) times daily.     Marland Kitchen LEVEMIR FLEXTOUCH 100 UNIT/ML Pen 20 Units daily at 10 pm.     . levothyroxine (SYNTHROID, LEVOTHROID) 125 MCG tablet Take 1 tablet by mouth daily.    Marland Kitchen linaclotide (LINZESS) 290 MCG CAPS capsule Take 1 capsule (290 mcg total) by mouth daily. 90 capsule 1  . Liraglutide (VICTOZA) 18 MG/3ML SOPN INJECT 0.2 MLS (1.2 MG TOTAL) INTO THE SKIN DAILY.    . metFORMIN (GLUCOPHAGE) 1000 MG tablet Take 1,000 mg by mouth 2 (two) times daily with a meal.    . methocarbamol (ROBAXIN) 750 MG tablet Take 750 mg by mouth every 8 (eight) hours.  1  . metoprolol succinate (TOPROL-XL) 50 MG 24 hr tablet Take 1 tablet (50 mg total) by mouth at bedtime. 90 tablet 3  . midodrine (PROAMATINE) 2.5 MG tablet Take 2.5 mg by mouth 3 (three) times daily.  0  . montelukast (SINGULAIR) 10 MG tablet Take 10 mg by mouth at bedtime.    . Needles & Syringes MISC 25G 5/8 inch needle on 3 ml syringe-Use once monthly for B12 injections 12 each 0  . nitroGLYCERIN (NITROSTAT) 0.4 MG SL tablet Place 0.4 mg under the tongue every 5 (five) minutes as needed for chest pain.     . Omega-3 Fatty Acids (FISH OIL PO) Take by mouth.    Glory Rosebush VERIO test strip 1 each by Other route as directed.     .  pantoprazole (PROTONIX) 40 MG tablet TAKE 1 TABLET (40 MG TOTAL) BY MOUTH 2 (TWO) TIMES DAILY. 60 tablet 3  . potassium chloride (K-DUR,KLOR-CON) 10 MEQ tablet Take 10 mEq by mouth once. Take one tablet every time you take lasix    . ramipril (ALTACE) 5  MG capsule Take 1 capsule (5 mg total) by mouth at bedtime. 90 capsule 3  . VAGIFEM 10 MCG TABS vaginal tablet Place 1 tablet vaginally once a week.   0   Current Facility-Administered Medications  Medication Dose Route Frequency Provider Last Rate Last Dose  . 0.9 %  sodium chloride infusion  500 mL Intravenous Continuous Kavitha Nandigam V, MD        Allergies:   Codeine and Sulfonamide derivatives   Social History:  The patient  reports that she has never smoked. She has never used smokeless tobacco. She reports that she does not drink alcohol or use drugs.   Family History:  The patient's  family history includes Cancer in her father and mother; Colon cancer in her maternal uncle; Colon polyps in her maternal uncle; Diabetes in her father, maternal aunt, maternal uncle, and mother; Esophageal cancer in her maternal uncle; Heart disease in her father and paternal grandmother; Hyperlipidemia in her father; Hypertension in her father and mother; Lung cancer in her father and maternal uncle; Ovarian cancer in her maternal aunt; Sarcoidosis in her mother; Urolithiasis in her father.    ROS:  Please see the history of present illness.  Otherwise, review of systems is positive for generalized fatigue.  All other systems are reviewed and negative.    PHYSICAL EXAM: VS:  BP (!) 114/52   Pulse 70   Ht 5\' 5"  (1.651 m)   Wt 178 lb 6.4 oz (80.9 kg)   BMI 29.69 kg/m  , BMI Body mass index is 29.69 kg/m. GEN: Well nourished, well developed, in no acute distress  HEENT: normal  Neck: no JVD, no masses. No carotid bruits Cardiac: RRR without murmur or gallop                Respiratory:  clear to auscultation bilaterally, normal work of  breathing GI: soft, nontender, nondistended, + BS MS: no deformity or atrophy  Ext: no pretibial edema, pedal pulses 2+= bilaterally Skin: warm and dry, no rash Neuro:  Strength and sensation are intact Psych: euthymic mood, full affect  EKG:  EKG is ordered today. The ekg ordered today shows NSR 73 bpm, nonspecific ST abnormality  Recent Labs: No results found for requested labs within last 8760 hours.   Lipid Panel     Component Value Date/Time   CHOL 123 01/20/2013 1626   TRIG 255.0 (H) 01/20/2013 1626   HDL 41.10 01/20/2013 1626   CHOLHDL 3 01/20/2013 1626   VLDL 51.0 (H) 01/20/2013 1626   LDLCALC 41 12/28/2008 1140   LDLDIRECT 46.2 01/20/2013 1626      Wt Readings from Last 3 Encounters:  01/16/17 178 lb 6.4 oz (80.9 kg)  07/24/16 179 lb (81.2 kg)  06/27/16 181 lb (82.1 kg)     Cardiac Studies Reviewed: 2D Echo 12-24-2013: Study Conclusions  - Left ventricle: The cavity size was normal. There was mild concentric hypertrophy. Systolic function was normal. The estimated ejection fraction was in the range of 60% to 65%. Wall motion was normal; there were no regional wall motion abnormalities. Doppler parameters are consistent with abnormal left ventricular relaxation (grade 1 diastolic dysfunction). Doppler parameters are consistent with high ventricular filling pressure. - Aortic valve: Mild regurgitation. Impressions:  - No change when compared to prior ECHO.  Myoview Scan 01-10-2016: Study Highlights    Nuclear stress EF: 65%.  There was no ST segment deviation noted during stress.  The study is normal.  The left ventricular ejection  fraction is normal (55-65%).   Normal stress nuclear study with no ischemia or infarction; EF 65 with normal wall motion.   Cardiac Cath 10-17-2004:  RESULTS:  1.  LEFT MAIN CORONARY ARTERY:  The left main coronary artery is free of      critical disease.  Unlike the previous study, there does not appear  to      be 20% tapered narrowing.  2.  LEFT ANTERIOR DESCENDING CORONARY ARTERY:  The left anterior descending      coronary artery courses to the apex.  As on previous studies, there is      about a 30% area of eccentric plaquing noted between the origins of the      two diagonal branches.  The vessel was otherwise without critical      disease.  3.  CIRCUMFLEX CORONARY ARTERY:  The circumflex coronary artery consists of      a very large one marginal branch that courses out over the apical      lateral wall.  No significant focal stenosis is noted.  4.  RIGHT CORONARY ARTERY:  The right coronary artery as in the previous      study is a large caliber vessel that provides twin PDA's, and two      posterolateral branches.  Neither of these demonstrate significant      obstruction.   CONCLUSION:  1.  Well-preserved left ventricular function.  2.  No evidence of aortic regurgitation or dissection.  3.  A 30% to at most 40% mid-left anterior descending coronary artery      stenosis, as noted on the previous study.  4.  No other high-grade critical obstructions.   PLAN:  Continued medical therapy would be recommended.  The patient will  remain on her Lipitor.  I will see her back in followup in the office and we  will discuss other potential options.  We could use long-acting nitrates.  We will continue to see her on a regular basis.   ASSESSMENT AND PLAN: 1.  CAD, native vessel, with atypical angina: Pt with longstanding intermittent chest pain with negative workup in past. Suspect GI-related symptoms since always occurring after lying supine and resolving when she gets up in the morning. Continue current Rx which is reviewed today. Nuclear stress test 2017 reviewed with no ischemic findings. LV function normal by echo.  2. Essential HTN: BP may be over-treated with recent episode of presyncope. Advised hold triamterene/HCT. Continue ACE/beta-blocker for now. Follow BP and push  fluids.  3. Hyperlipidemia: treated with atorvastatin  4. Type II DM: followed by PCP. Medicines reviewed today. Lifestyle discussed.   5. Carotid stenosis: reviewed duplex from 10-17. Less than 40% bilateral ICA stenosis. Followed by [facility].  6. Presyncope: as above, advised push fluids, stop maxzide.   Current medicines are reviewed with the patient today.  The patient does not have concerns regarding medicines.  Labs/ tests ordered today include:   Orders Placed This Encounter  Procedures  . EKG 12-Lead    Disposition:   FU one year  Signed, Sherren Mocha, MD  01/18/2017 6:25 AM    [facility] [address], [place], [place]  [address] Phone: [phone]; Fax: [phone]

## 2017-01-22 DIAGNOSIS — E1121 Type 2 diabetes mellitus with diabetic nephropathy: Secondary | ICD-10-CM | POA: Diagnosis not present

## 2017-01-22 DIAGNOSIS — E038 Other specified hypothyroidism: Secondary | ICD-10-CM | POA: Diagnosis not present

## 2017-01-22 DIAGNOSIS — I10 Essential (primary) hypertension: Secondary | ICD-10-CM | POA: Diagnosis not present

## 2017-01-22 DIAGNOSIS — R69 Illness, unspecified: Secondary | ICD-10-CM | POA: Diagnosis not present

## 2017-01-22 DIAGNOSIS — Z6829 Body mass index (BMI) 29.0-29.9, adult: Secondary | ICD-10-CM | POA: Diagnosis not present

## 2017-01-22 DIAGNOSIS — K210 Gastro-esophageal reflux disease with esophagitis: Secondary | ICD-10-CM | POA: Diagnosis not present

## 2017-01-29 DIAGNOSIS — E039 Hypothyroidism, unspecified: Secondary | ICD-10-CM | POA: Diagnosis not present

## 2017-01-29 DIAGNOSIS — E119 Type 2 diabetes mellitus without complications: Secondary | ICD-10-CM | POA: Diagnosis not present

## 2017-01-29 DIAGNOSIS — Z794 Long term (current) use of insulin: Secondary | ICD-10-CM | POA: Diagnosis not present

## 2017-01-29 DIAGNOSIS — E118 Type 2 diabetes mellitus with unspecified complications: Secondary | ICD-10-CM | POA: Diagnosis not present

## 2017-01-29 DIAGNOSIS — Z7982 Long term (current) use of aspirin: Secondary | ICD-10-CM | POA: Diagnosis not present

## 2017-01-29 DIAGNOSIS — I2510 Atherosclerotic heart disease of native coronary artery without angina pectoris: Secondary | ICD-10-CM | POA: Diagnosis not present

## 2017-01-29 DIAGNOSIS — Z79899 Other long term (current) drug therapy: Secondary | ICD-10-CM | POA: Diagnosis not present

## 2017-01-30 ENCOUNTER — Ambulatory Visit (INDEPENDENT_AMBULATORY_CARE_PROVIDER_SITE_OTHER): Payer: Medicare HMO | Admitting: Gastroenterology

## 2017-01-30 ENCOUNTER — Encounter: Payer: Self-pay | Admitting: Gastroenterology

## 2017-01-30 VITALS — BP 104/58 | HR 68 | Ht 64.0 in | Wt 184.8 lb

## 2017-01-30 DIAGNOSIS — R110 Nausea: Secondary | ICD-10-CM

## 2017-01-30 DIAGNOSIS — K581 Irritable bowel syndrome with constipation: Secondary | ICD-10-CM | POA: Diagnosis not present

## 2017-01-30 DIAGNOSIS — Z800 Family history of malignant neoplasm of digestive organs: Secondary | ICD-10-CM

## 2017-01-30 DIAGNOSIS — Z86010 Personal history of colonic polyps: Secondary | ICD-10-CM | POA: Diagnosis not present

## 2017-01-30 DIAGNOSIS — K5900 Constipation, unspecified: Secondary | ICD-10-CM | POA: Diagnosis not present

## 2017-01-30 MED ORDER — LINACLOTIDE 145 MCG PO CAPS: 145.0000 ug | ORAL_CAPSULE | Freq: Every day | ORAL | 3 refills | Status: DC

## 2017-01-30 MED ORDER — NA SULFATE-K SULFATE-MG SULF 17.5-3.13-1.6 GM/177ML PO SOLN: 1.0000 | Freq: Once | ORAL | 0 refills | Status: AC

## 2017-01-30 MED ORDER — ONDANSETRON HCL 4 MG PO TABS: 4.0000 mg | ORAL_TABLET | Freq: Three times a day (TID) | ORAL | 1 refills | Status: DC | PRN

## 2017-01-30 NOTE — Patient Instructions (Addendum)
You have been scheduled for a colonoscopy. Please follow written instructions given to you at your visit today.  Please pick up your prep supplies at the pharmacy within the next 1-3 days. If you use inhalers (even only as needed), please bring them with you on the day of your procedure. Your physician has requested that you go to www.startemmi.com and enter the access code given to you at your visit today. This web site gives a general overview about your procedure. However, you should still follow specific instructions given to you by our office regarding your preparation for the procedure.  We will send Linzess and Zofran to your pharmacy  Use IBGard 1 capsule three times a day as needed       X   ORAL DIABETIC MEDICATION INSTRUCTIONS  The day before your procedure:  Take your diabetic pill as you do normally  The day of your procedure:  Do not take your diabetic pill   We will check your blood sugar levels during the admission process and again in Recovery before discharging you home  ______________________________________________________________________  X   INSULIN (LONG ACTING) MEDICATION INSTRUCTIONS (Lantus, NPH, 70/30, Humulin, Novolin-N, Levemir, )   The day before your procedure:  Take  your regular evening dose    The day of your procedure:  Do not take your morning dose   X   INSULIN (SHORT ACTING) MEDICATION INSTRUCTIONS (Regular, Humulog, Novolog, Apidra, Novolin, Humulin)   The day before your procedure:  Do not take your evening dose   The day of your procedure:  Do not take your morning dose  ______________________________________________________________________               X OTHER NON-INSULIN INJECTABLE MEDICATIONS         (Tanzeum, Trulicity, Byetta, Victoza, Bydureon, SymlinPen)  Hold the am of the procedure

## 2017-01-30 NOTE — Progress Notes (Signed)
Jeanne Kim    [id number]    09-05-1941  Primary Care Physician:XAJE [name], MD  Referring Physician: Neale Burly, MD [id number] [place], [place] [address]  Chief complaint:  Constipation with alternating diarrhea  HPI: 76 year old female status post hysterectomy, repair of rectocele, cystocele and enterocele, adhesiolysis, irritable bowel syndrome, chronic constipation here for follow-up visit. She was last seen in August 2017. She is taking Linzess 290 micrograms twice weekly with loose bowel movement. 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 She tried lower dose Linzess in the past with no significant improvement of constipation. She continues to have abdominal fullness, bloating and intermittent nausea. Patient has family history of colon cancer and also personal history of adenomatous polyps. Last colonoscopy in March 2013 by Dr. Sharlett Iles showed diverticulosis and internal hemorrhoids with no polyps She has personal history of breast cancer and family history of gastric, lung, ovarian and colon cancer Per patient she was tested for BRCA genes and was negative. She is not sure if she was tested for any other genetic cancer predisposition  Outpatient Encounter Prescriptions as of 01/30/2017  Medication Sig  . aspirin 81 MG chewable tablet Chew 81 mg by mouth daily.  Marland Kitchen atorvastatin (LIPITOR) 20 MG tablet Take 1 tablet (20 mg total) by mouth daily.  . BELSOMRA 10 MG TABS Take 1 tablet by mouth daily.   . calcium-vitamin D (OSCAL WITH D) 500-200 MG-UNIT per tablet Take 1 tablet by mouth 2 (two) times daily.  . Coenzyme Q10 (CO Q-10) 100 MG CAPS Take 100 mg by mouth daily.  . cyanocobalamin (,VITAMIN B-12,) 1000 MCG/ML injection INJECT 1 ML INTO MUSCLE ONCE A MONTH  . DULoxetine (CYMBALTA) 60 MG capsule Take 60 mg by mouth daily.  Marland Kitchen EPINEPHrine 0.3 mg/0.3 mL IJ SOAJ injection  Frequency:PHARMDIR   Dosage:0.0     Instructions:  Note:self inject in the event of allergic reaction, then proceed immediately to the nearest Emergency Room. Dose: 1 INJECTION  . fexofenadine (ALLEGRA) 30 MG tablet Take 30 mg by mouth daily.  . fluticasone (FLONASE) 50 MCG/ACT nasal spray Place 2 sprays into the nose daily.  . furosemide (LASIX) 40 MG tablet Take 1 pill daily for 3 days then as needed for swelling  . gabapentin (NEURONTIN) 300 MG capsule Take 300 mg by mouth at bedtime.  Marland Kitchen glipiZIDE (GLUCOTROL) 10 MG tablet Take 10 mg by mouth daily before breakfast.  . Insulin Lispro Prot & Lispro (HUMALOG MIX 75/25 KWIKPEN) (75-25) 100 UNIT/ML Kwikpen Inject 4-5 Units into the skin 3 (three) times daily.   Marland Kitchen LEVEMIR FLEXTOUCH 100 UNIT/ML Pen 20 Units daily at 10 pm.   . levothyroxine (SYNTHROID, LEVOTHROID) 125 MCG tablet Take 1 tablet by mouth daily.  Marland Kitchen linaclotide (LINZESS) 290 MCG CAPS capsule Take 1 capsule (290 mcg total) by mouth daily.  . Liraglutide (VICTOZA) 18 MG/3ML SOPN INJECT 0.2 MLS (1.2 MG TOTAL) INTO THE SKIN DAILY.  . metFORMIN (GLUCOPHAGE) 1000 MG tablet Take 1,000 mg by mouth 2 (two) times daily with a meal.  . methocarbamol (ROBAXIN) 750 MG tablet Take 750 mg by mouth every 8 (eight) hours.  . metoprolol succinate (TOPROL-XL) 50 MG 24 hr tablet Take 1 tablet (50 mg total) by mouth at bedtime.  . midodrine (PROAMATINE) 2.5 MG tablet Take 2.5 mg by mouth 3 (three) times daily.  . montelukast (SINGULAIR) 10 MG tablet Take 10  mg by mouth at bedtime.  . Needles & Syringes MISC 25G 5/8 inch needle on 3 ml syringe-Use once monthly for B12 injections  . nitroGLYCERIN (NITROSTAT) 0.4 MG SL tablet Place 0.4 mg under the tongue every 5 (five) minutes as needed for chest pain.   . Omega-3 Fatty Acids (FISH OIL PO) Take by mouth.  Glory Rosebush VERIO test strip 1 each by Other route as directed.   . pantoprazole (PROTONIX) 40 MG tablet TAKE 1 TABLET (40 MG TOTAL) BY MOUTH 2 (TWO) TIMES  DAILY.  Marland Kitchen potassium chloride (K-DUR,KLOR-CON) 10 MEQ tablet Take 10 mEq by mouth once. Take one tablet every time you take lasix  . ramipril (ALTACE) 5 MG capsule Take 1 capsule (5 mg total) by mouth at bedtime.  Marland Kitchen VAGIFEM 10 MCG TABS vaginal tablet Place 1 tablet vaginally once a week.    Facility-Administered Encounter Medications as of 01/30/2017  Medication  . 0.9 %  sodium chloride infusion    Allergies as of 01/30/2017 - Review Complete 01/30/2017  Allergen Reaction Noted  . Codeine Hives 04/27/2009  . Sulfonamide derivatives Hives 04/27/2009    Past Medical History:  Diagnosis Date  . Allergy   . Anemia   . Anginal pain ([place])   . Anxiety   . Arthritis   . Blood transfusion without reported diagnosis   . Breast cancer ([place])   . Carotid artery plaque    bilat  . Cataract   . Colon polyps    adenomatous  . Concussion 02/2016   Driving restrictions for 6w  . Coronary heart disease   . Depression   . Diverticulosis   . Dizziness   . Esophageal reflux   . Esophageal stricture   . Fibromyalgia   . Gallstones   . Hiatal hernia   . Hypercholesterolemia   . Hypertension   . Hypothyroidism   . IBS (irritable bowel syndrome)   . Kidney stones   . Pneumonia    recently, just finished ABO  . Pneumonia   . PONV (postoperative nausea and vomiting)   . Sleep apnea    CPAP  . Thyroid disease 2002  . Type II or unspecified type diabetes mellitus without mention of complication, not stated as uncontrolled   . Unspecified gastritis and gastroduodenitis without mention of hemorrhage     Past Surgical History:  Procedure Laterality Date  . ANAL RECTAL MANOMETRY N/A 08/05/2015   Procedure: ANO RECTAL MANOMETRY;  Surgeon: Mauri Pole, MD;  Location: [facility];  Service: Endoscopy;  Laterality: N/A;  . APPENDECTOMY    . BREAST LUMPECTOMY     left  . CARDIAC CATHETERIZATION    . CATARACT EXTRACTION W/PHACO Left 03/30/2013   Procedure: CATARACT EXTRACTION PHACO  AND INTRAOCULAR LENS PLACEMENT (IOC);  Surgeon: Tonny Branch, MD;  Location: [facility];  Service: Ophthalmology;  Laterality: Left;  CDE:19.28  . CATARACT EXTRACTION W/PHACO Right 04/09/2013   Procedure: CATARACT EXTRACTION PHACO AND INTRAOCULAR LENS PLACEMENT (IOC);  Surgeon: Tonny Branch, MD;  Location: [facility];  Service: Ophthalmology;  Laterality: Right;  CDE: 15.44  . CERVICAL FUSION    . CESAREAN SECTION    . CHOLECYSTECTOMY    . CYSTOCELE REPAIR    . DILATION AND CURETTAGE OF UTERUS    . EYE SURGERY Left 03-30-13   Cataract  . EYE SURGERY Right 04-09-13   Cataract  . RECTOCELE REPAIR    . THYROIDECTOMY    . TONSILLECTOMY    . TOTAL ABDOMINAL HYSTERECTOMY  Family History  Problem Relation Age of Onset  . Sarcoidosis Mother   . Diabetes Mother   . Cancer Mother     sarcoma -  Right arm amputation  . Hypertension Mother   . Lung cancer Father   . Diabetes Father   . Heart disease Father     Before age 38  . Urolithiasis Father   . Cancer Father     Lung  . Hyperlipidemia Father   . Hypertension Father   . Esophageal cancer Maternal Uncle   . Ovarian cancer Maternal Aunt   . Colon cancer Maternal Uncle   . Lung cancer Maternal Uncle   . Diabetes Maternal Uncle   . Diabetes Maternal Aunt   . Colon polyps Maternal Uncle   . Heart disease Paternal Grandmother     Social History   Social History  . Marital status: Married    Spouse name: N/A  . Number of children: 4  . Years of education: N/A   Occupational History  . Retired Marine scientist Retired   Social History Main Topics  . Smoking status: Never Smoker  . Smokeless tobacco: Never Used  . Alcohol use No  . Drug use: No  . Sexual activity: Not on file   Other Topics Concern  . Not on file   Social History Narrative  . No narrative on file      Review of systems: Review of Systems  Constitutional: Negative for fever and chills.  HENT: Positive for ringing in the ears.   Eyes: Negative for blurred vision.    Respiratory: Negative for cough, shortness of breath and wheezing.   Cardiovascular: Negative for chest pain and palpitations.  Gastrointestinal: as per HPI Genitourinary: Negative for dysuria, urgency, frequency and hematuria.  Musculoskeletal: Positive for myalgias, back pain and joint pain.  Skin: Negative for itching and rash.  Neurological: Positive for dizziness; negative for tremors, focal weakness, seizures and loss of consciousness.  Endo/Heme/Allergies: Positive for seasonal allergies.  Psychiatric/Behavioral: Negative for depression, suicidal ideas and hallucinations.  All other systems reviewed and are negative.   Physical Exam: Vitals:   01/30/17 1040  BP: (!) 104/58  Pulse: 68   Body mass index is 31.72 kg/m. Gen:      No acute distress HEENT:  EOMI, sclera anicteric Neck:     No masses; no thyromegaly Lungs:    Clear to auscultation bilaterally; normal respiratory effort CV:         Regular rate and rhythm; no murmurs Abd:      + bowel sounds; soft, non-tender; no palpable masses, no distension Ext:    No edema; adequate peripheral perfusion Skin:      Warm and dry; no rash Neuro: alert and oriented x 3 Psych: normal mood and affect  Data Reviewed:  Reviewed labs, radiology imaging, old records and pertinent past GI work up   Assessment and Plan/Recommendations:  76 year old female with history of diabetes, chronic GERD, status post hysterectomy, multiple abdominal surgeries s/p adhesiolysis, irritable bowel syndrome and chronic constipation  Constipation: Patient is currently taking Linzess 290 g about twice weekly with multiple liquid bowel movements after she takes it. We'll try overdose Linzess 170mg daily or every other day. She has tried to overdose in the past prior to pelvic floor PD and biofeedback with no significant improvement of constipation Increase fluid intake  Nausea: Secondary to GERD versus constipation. Usually improves after she has  bowel movements Zofran as needed  GERD: Well controlled on PPI  Continue antireflux measures  Colorectal cancer screening: History of adenomatous colon polyps and family history of colon cancer Due for surveillance colonoscopy in March 2018, we'll schedule it The risks and benefits as well as alternatives of endoscopic procedure(s) have been discussed and reviewed. All questions answered. The patient agrees to proceed.     Damaris Hippo , MD [phone] Mon-Fri 8a-5p [phone] after 5p, weekends, holidays  CC: Hasanaj, Samul Dada, MD

## 2017-01-31 ENCOUNTER — Encounter: Payer: Self-pay | Admitting: Gastroenterology

## 2017-01-31 ENCOUNTER — Other Ambulatory Visit: Payer: Self-pay | Admitting: *Deleted

## 2017-01-31 MED ORDER — PANTOPRAZOLE SODIUM 40 MG PO TBEC: 40.0000 mg | DELAYED_RELEASE_TABLET | Freq: Two times a day (BID) | ORAL | 3 refills | Status: DC

## 2017-01-31 NOTE — Telephone Encounter (Signed)
[facility]

## 2017-02-10 ENCOUNTER — Other Ambulatory Visit: Payer: Self-pay | Admitting: Cardiovascular Disease

## 2017-02-11 ENCOUNTER — Other Ambulatory Visit: Payer: Self-pay | Admitting: Cardiovascular Disease

## 2017-02-11 NOTE — Telephone Encounter (Signed)
metoprolol succinate (TOPROL-XL) 50 MG 24 hr tablet  Medication  Date: 02/11/2017 Department: [address] Office Ordering/Authorizing: Sherren Mocha, MD  Order Providers   Prescribing Provider Encounter Provider  Sherren Mocha, MD Sherren Mocha, MD  Medication Detail    Disp Refills Start End   metoprolol succinate (TOPROL-XL) 50 MG 24 hr tablet 90 tablet 3 02/11/2017    Sig - Route: TAKE 1 TABLET (50 MG TOTAL) BY MOUTH AT BEDTIME. - Oral   E-Prescribing Status: Receipt confirmed by pharmacy (02/11/2017 12:18 PM EDT)   Pharmacy   [id number] - [place], [place]

## 2017-02-13 ENCOUNTER — Encounter: Payer: Self-pay | Admitting: Gastroenterology

## 2017-02-13 ENCOUNTER — Ambulatory Visit (AMBULATORY_SURGERY_CENTER): Payer: Medicare HMO | Admitting: Gastroenterology

## 2017-02-13 VITALS — BP 126/65 | HR 62 | Temp 97.1°F | Resp 15 | Ht 67.5 in | Wt 184.0 lb

## 2017-02-13 DIAGNOSIS — E669 Obesity, unspecified: Secondary | ICD-10-CM | POA: Diagnosis not present

## 2017-02-13 DIAGNOSIS — I2510 Atherosclerotic heart disease of native coronary artery without angina pectoris: Secondary | ICD-10-CM | POA: Diagnosis not present

## 2017-02-13 DIAGNOSIS — K5900 Constipation, unspecified: Secondary | ICD-10-CM | POA: Diagnosis not present

## 2017-02-13 DIAGNOSIS — E119 Type 2 diabetes mellitus without complications: Secondary | ICD-10-CM | POA: Diagnosis not present

## 2017-02-13 DIAGNOSIS — Z800 Family history of malignant neoplasm of digestive organs: Secondary | ICD-10-CM

## 2017-02-13 DIAGNOSIS — Z86010 Personal history of colonic polyps: Secondary | ICD-10-CM

## 2017-02-13 DIAGNOSIS — I10 Essential (primary) hypertension: Secondary | ICD-10-CM | POA: Diagnosis not present

## 2017-02-13 DIAGNOSIS — G4733 Obstructive sleep apnea (adult) (pediatric): Secondary | ICD-10-CM | POA: Diagnosis not present

## 2017-02-13 DIAGNOSIS — R110 Nausea: Secondary | ICD-10-CM | POA: Diagnosis not present

## 2017-02-13 MED ORDER — SODIUM CHLORIDE 0.9 % IV SOLN: 500.0000 mL | INTRAVENOUS | Status: DC

## 2017-02-13 NOTE — Patient Instructions (Signed)
YOU HAD AN ENDOSCOPIC PROCEDURE TODAY AT [facility]:   Refer to the procedure report that was given to you for any specific questions about what was found during the examination.  If the procedure report does not answer your questions, please call your gastroenterologist to clarify.  If you requested that your care partner not be given the details of your procedure findings, then the procedure report has been included in a sealed envelope for you to review at your convenience later.  YOU SHOULD EXPECT: Some feelings of bloating in the abdomen. Passage of more gas than usual.  Walking can help get rid of the air that was put into your GI tract during the procedure and reduce the bloating. If you had a lower endoscopy (such as a colonoscopy or flexible sigmoidoscopy) you may notice spotting of blood in your stool or on the toilet paper. If you underwent a bowel prep for your procedure, you may not have a normal bowel movement for a few days.  Please Note:  You might notice some irritation and congestion in your nose or some drainage.  This is from the oxygen used during your procedure.  There is no need for concern and it should clear up in a day or so.  SYMPTOMS TO REPORT IMMEDIATELY:   Following lower endoscopy (colonoscopy or flexible sigmoidoscopy):  Excessive amounts of blood in the stool  Significant tenderness or worsening of abdominal pains  Swelling of the abdomen that is new, acute  Fever of 100F or higher    For urgent or emergent issues, a gastroenterologist can be reached at any hour by calling [phone].   DIET:  We do recommend a small meal at first, but then you may proceed to your regular diet.  Drink plenty of fluids but you should avoid alcoholic beverages for 24 hours.  ACTIVITY:  You should plan to take it easy for the rest of today and you should NOT DRIVE or use heavy machinery until tomorrow (because of the sedation medicines used during the test).     FOLLOW UP: Our staff will call the number listed on your records the next business day following your procedure to check on you and address any questions or concerns that you may have regarding the information given to you following your procedure. If we do not reach you, we will leave a message.  However, if you are feeling well and you are not experiencing any problems, there is no need to return our call.  We will assume that you have returned to your regular daily activities without incident.  If any biopsies were taken you will be contacted by phone or by letter within the next 1-3 weeks.  Please call us at [phone] if you have not heard about the biopsies in 3 weeks.   Diverticulosis (handout given) Hemorrhoids (handout given) Repeat Colonoscopy Screening in 5 years.   SIGNATURES/CONFIDENTIALITY: You and/or your care partner have signed paperwork which will be entered into your electronic medical record.  These signatures attest to the fact that that the information above on your After Visit Summary has been reviewed and is understood.  Full responsibility of the confidentiality of this discharge information lies with you and/or your care-partner.

## 2017-02-13 NOTE — Progress Notes (Signed)
Pt's states no medical or surgical changes since previsit or office visit. 

## 2017-02-13 NOTE — Progress Notes (Signed)
To recovery, report to Jones, RN, VSS 

## 2017-02-13 NOTE — Op Note (Signed)
[place] Patient Name: Mathew Storck Procedure Date: 02/13/2017 9:38 AM MRN: [id number] Endoscopist: Mauri Pole , MD Age: 76 Referring MD:  Date of Birth: August 22, 1941 Gender: Female Account #: [id number] Procedure:                Colonoscopy Indications:              Screening in patient at increased risk: Family                            history of 1st-degree relative with colorectal                            cancer, High risk colon cancer surveillance:                            Personal history of colonic polyps Medicines:                Monitored Anesthesia Care Procedure:                Pre-Anesthesia Assessment:                           - Prior to the procedure, a History and Physical                            was performed, and patient medications and                            allergies were reviewed. The patient's tolerance of                            previous anesthesia was also reviewed. The risks                            and benefits of the procedure and the sedation                            options and risks were discussed with the patient.                            All questions were answered, and informed consent                            was obtained. Prior Anticoagulants: The patient has                            taken no previous anticoagulant or antiplatelet                            agents. ASA Grade Assessment: III - A patient with                            severe systemic disease. After reviewing the risks  and benefits, the patient was deemed in                            satisfactory condition to undergo the procedure.                           After obtaining informed consent, the colonoscope                            was passed under direct vision. Throughout the                            procedure, the patient's blood pressure, pulse, and                            oxygen saturations were  monitored continuously. The                            Colonoscope was introduced through the anus and                            advanced to the the cecum, identified by                            appendiceal orifice and ileocecal valve. The                            colonoscopy was technically difficult and complex                            due to poor bowel prep. Successful completion of                            the procedure was aided by lavage. The patient                            tolerated the procedure well. The quality of the                            bowel preparation was adequate. The ileocecal                            valve, appendiceal orifice, and rectum were                            photographed. Scope In: 9:44:30 AM Scope Out: 10:12:58 AM Scope Withdrawal Time: 0 hours 16 minutes 34 seconds  Total Procedure Duration: 0 hours 28 minutes 28 seconds  Findings:                 The perianal and digital rectal examinations were                            normal.  Non-bleeding internal hemorrhoids were found during                            retroflexion. The hemorrhoids were small.                           A few small-mouthed diverticula were found in the                            sigmoid colon. Complications:            No immediate complications. Estimated Blood Loss:     Estimated blood loss: none. Impression:               - Non-bleeding internal hemorrhoids.                           - Diverticulosis in the sigmoid colon.                           - No specimens collected. Recommendation:           - Patient has a contact number available for                            emergencies. The signs and symptoms of potential                            delayed complications were discussed with the                            patient. Return to normal activities tomorrow.                            Written discharge instructions were provided to  the                            patient.                           - Resume previous diet.                           - Continue present medications.                           - Repeat colonoscopy in 5 years for surveillance.                           - For future colonoscopy the patient will require                            an extended 2 days preparation. If there are any                            questions, please contact the gastroenterologist. Mauri Pole, MD 02/13/2017 10:17:54 AM This report has been signed electronically.

## 2017-02-14 ENCOUNTER — Telehealth: Payer: Self-pay | Admitting: *Deleted

## 2017-02-14 NOTE — Telephone Encounter (Signed)
No answer will call back later for follow up. SM

## 2017-02-14 NOTE — Telephone Encounter (Signed)
  Follow up Call-  Call back number 02/13/2017 06/27/2016  Post procedure Call Back phone  # [phone]  Permission to leave phone message Yes Yes  Some recent data might be hidden     Patient questions:  Do you have a fever, pain , or abdominal swelling? No. Pain Score  0 *  Have you tolerated food without any problems? Yes.    Have you been able to return to your normal activities? Yes.    Do you have any questions about your discharge instructions: Diet   No. Medications  No. Follow up visit  No.  Do you have questions or concerns about your Care? No.  Actions: * If pain score is 4 or above: No action needed, pain <4.

## 2017-02-25 ENCOUNTER — Ambulatory Visit: Payer: Medicare HMO | Admitting: Cardiovascular Disease

## 2017-03-15 ENCOUNTER — Other Ambulatory Visit: Payer: Self-pay | Admitting: *Deleted

## 2017-03-15 MED ORDER — LINACLOTIDE 145 MCG PO CAPS: 145.0000 ug | ORAL_CAPSULE | Freq: Every day | ORAL | 3 refills | Status: DC

## 2017-04-20 ENCOUNTER — Other Ambulatory Visit: Payer: Self-pay | Admitting: Gastroenterology

## 2017-05-17 DIAGNOSIS — E1165 Type 2 diabetes mellitus with hyperglycemia: Secondary | ICD-10-CM | POA: Diagnosis not present

## 2017-05-17 DIAGNOSIS — Z6830 Body mass index (BMI) 30.0-30.9, adult: Secondary | ICD-10-CM | POA: Diagnosis not present

## 2017-05-17 DIAGNOSIS — K210 Gastro-esophageal reflux disease with esophagitis: Secondary | ICD-10-CM | POA: Diagnosis not present

## 2017-05-17 DIAGNOSIS — I10 Essential (primary) hypertension: Secondary | ICD-10-CM | POA: Diagnosis not present

## 2017-05-17 DIAGNOSIS — E038 Other specified hypothyroidism: Secondary | ICD-10-CM | POA: Diagnosis not present

## 2017-05-17 DIAGNOSIS — R69 Illness, unspecified: Secondary | ICD-10-CM | POA: Diagnosis not present

## 2017-05-22 DIAGNOSIS — E1165 Type 2 diabetes mellitus with hyperglycemia: Secondary | ICD-10-CM | POA: Diagnosis not present

## 2017-05-22 DIAGNOSIS — E038 Other specified hypothyroidism: Secondary | ICD-10-CM | POA: Diagnosis not present

## 2017-05-22 DIAGNOSIS — I10 Essential (primary) hypertension: Secondary | ICD-10-CM | POA: Diagnosis not present

## 2017-05-24 DIAGNOSIS — R69 Illness, unspecified: Secondary | ICD-10-CM | POA: Diagnosis not present

## 2017-06-08 DIAGNOSIS — L89899 Pressure ulcer of other site, unspecified stage: Secondary | ICD-10-CM | POA: Diagnosis not present

## 2017-06-11 DIAGNOSIS — E1165 Type 2 diabetes mellitus with hyperglycemia: Secondary | ICD-10-CM | POA: Diagnosis not present

## 2017-06-11 DIAGNOSIS — E039 Hypothyroidism, unspecified: Secondary | ICD-10-CM | POA: Diagnosis not present

## 2017-06-11 DIAGNOSIS — Z794 Long term (current) use of insulin: Secondary | ICD-10-CM | POA: Diagnosis not present

## 2017-06-17 DIAGNOSIS — H11441 Conjunctival cysts, right eye: Secondary | ICD-10-CM | POA: Diagnosis not present

## 2017-06-17 DIAGNOSIS — I10 Essential (primary) hypertension: Secondary | ICD-10-CM | POA: Diagnosis not present

## 2017-06-17 DIAGNOSIS — L718 Other rosacea: Secondary | ICD-10-CM | POA: Diagnosis not present

## 2017-06-17 DIAGNOSIS — H16223 Keratoconjunctivitis sicca, not specified as Sjogren's, bilateral: Secondary | ICD-10-CM | POA: Diagnosis not present

## 2017-06-17 DIAGNOSIS — H43812 Vitreous degeneration, left eye: Secondary | ICD-10-CM | POA: Diagnosis not present

## 2017-06-17 DIAGNOSIS — H16422 Pannus (corneal), left eye: Secondary | ICD-10-CM | POA: Diagnosis not present

## 2017-06-17 DIAGNOSIS — H35032 Hypertensive retinopathy, left eye: Secondary | ICD-10-CM | POA: Diagnosis not present

## 2017-06-17 DIAGNOSIS — H524 Presbyopia: Secondary | ICD-10-CM | POA: Diagnosis not present

## 2017-06-17 DIAGNOSIS — H04123 Dry eye syndrome of bilateral lacrimal glands: Secondary | ICD-10-CM | POA: Diagnosis not present

## 2017-06-17 DIAGNOSIS — H35031 Hypertensive retinopathy, right eye: Secondary | ICD-10-CM | POA: Diagnosis not present

## 2017-07-01 DIAGNOSIS — Z08 Encounter for follow-up examination after completed treatment for malignant neoplasm: Secondary | ICD-10-CM | POA: Diagnosis not present

## 2017-07-01 DIAGNOSIS — I2510 Atherosclerotic heart disease of native coronary artery without angina pectoris: Secondary | ICD-10-CM | POA: Diagnosis not present

## 2017-07-01 DIAGNOSIS — Z9221 Personal history of antineoplastic chemotherapy: Secondary | ICD-10-CM | POA: Diagnosis not present

## 2017-07-01 DIAGNOSIS — I10 Essential (primary) hypertension: Secondary | ICD-10-CM | POA: Diagnosis not present

## 2017-07-01 DIAGNOSIS — Z923 Personal history of irradiation: Secondary | ICD-10-CM | POA: Diagnosis not present

## 2017-07-01 DIAGNOSIS — M1990 Unspecified osteoarthritis, unspecified site: Secondary | ICD-10-CM | POA: Diagnosis not present

## 2017-07-01 DIAGNOSIS — C50919 Malignant neoplasm of unspecified site of unspecified female breast: Secondary | ICD-10-CM | POA: Diagnosis not present

## 2017-07-01 DIAGNOSIS — Z853 Personal history of malignant neoplasm of breast: Secondary | ICD-10-CM | POA: Diagnosis not present

## 2017-07-01 DIAGNOSIS — Z90722 Acquired absence of ovaries, bilateral: Secondary | ICD-10-CM | POA: Diagnosis not present

## 2017-07-01 DIAGNOSIS — C50912 Malignant neoplasm of unspecified site of left female breast: Secondary | ICD-10-CM | POA: Diagnosis not present

## 2017-07-01 DIAGNOSIS — I779 Disorder of arteries and arterioles, unspecified: Secondary | ICD-10-CM | POA: Diagnosis not present

## 2017-07-01 DIAGNOSIS — M797 Fibromyalgia: Secondary | ICD-10-CM | POA: Diagnosis not present

## 2017-07-01 DIAGNOSIS — Z170 Estrogen receptor positive status [ER+]: Secondary | ICD-10-CM | POA: Diagnosis not present

## 2017-07-09 DIAGNOSIS — R69 Illness, unspecified: Secondary | ICD-10-CM | POA: Diagnosis not present

## 2017-08-05 DIAGNOSIS — L03211 Cellulitis of face: Secondary | ICD-10-CM | POA: Diagnosis not present

## 2017-08-05 DIAGNOSIS — Z6830 Body mass index (BMI) 30.0-30.9, adult: Secondary | ICD-10-CM | POA: Diagnosis not present

## 2017-08-08 DIAGNOSIS — E1121 Type 2 diabetes mellitus with diabetic nephropathy: Secondary | ICD-10-CM | POA: Diagnosis not present

## 2017-08-08 DIAGNOSIS — K210 Gastro-esophageal reflux disease with esophagitis: Secondary | ICD-10-CM | POA: Diagnosis not present

## 2017-08-08 DIAGNOSIS — I10 Essential (primary) hypertension: Secondary | ICD-10-CM | POA: Diagnosis not present

## 2017-08-08 DIAGNOSIS — E038 Other specified hypothyroidism: Secondary | ICD-10-CM | POA: Diagnosis not present

## 2017-08-14 ENCOUNTER — Other Ambulatory Visit: Payer: Self-pay | Admitting: Gastroenterology

## 2017-08-19 DIAGNOSIS — R69 Illness, unspecified: Secondary | ICD-10-CM | POA: Diagnosis not present

## 2017-08-28 DIAGNOSIS — E7849 Other hyperlipidemia: Secondary | ICD-10-CM | POA: Diagnosis not present

## 2017-08-28 DIAGNOSIS — G629 Polyneuropathy, unspecified: Secondary | ICD-10-CM | POA: Diagnosis not present

## 2017-08-28 DIAGNOSIS — E1121 Type 2 diabetes mellitus with diabetic nephropathy: Secondary | ICD-10-CM | POA: Diagnosis not present

## 2017-08-28 DIAGNOSIS — L03211 Cellulitis of face: Secondary | ICD-10-CM | POA: Diagnosis not present

## 2017-08-28 DIAGNOSIS — I10 Essential (primary) hypertension: Secondary | ICD-10-CM | POA: Diagnosis not present

## 2017-08-28 DIAGNOSIS — Z6830 Body mass index (BMI) 30.0-30.9, adult: Secondary | ICD-10-CM | POA: Diagnosis not present

## 2017-09-23 DIAGNOSIS — Z6830 Body mass index (BMI) 30.0-30.9, adult: Secondary | ICD-10-CM | POA: Diagnosis not present

## 2017-09-23 DIAGNOSIS — M5432 Sciatica, left side: Secondary | ICD-10-CM | POA: Diagnosis not present

## 2017-09-27 DIAGNOSIS — I10 Essential (primary) hypertension: Secondary | ICD-10-CM | POA: Diagnosis not present

## 2017-09-27 DIAGNOSIS — K210 Gastro-esophageal reflux disease with esophagitis: Secondary | ICD-10-CM | POA: Diagnosis not present

## 2017-09-27 DIAGNOSIS — E1121 Type 2 diabetes mellitus with diabetic nephropathy: Secondary | ICD-10-CM | POA: Diagnosis not present

## 2017-10-09 DIAGNOSIS — M5124 Other intervertebral disc displacement, thoracic region: Secondary | ICD-10-CM | POA: Diagnosis not present

## 2017-10-09 DIAGNOSIS — E785 Hyperlipidemia, unspecified: Secondary | ICD-10-CM | POA: Diagnosis not present

## 2017-10-09 DIAGNOSIS — R159 Full incontinence of feces: Secondary | ICD-10-CM | POA: Diagnosis not present

## 2017-10-09 DIAGNOSIS — I10 Essential (primary) hypertension: Secondary | ICD-10-CM | POA: Diagnosis not present

## 2017-10-09 DIAGNOSIS — M545 Low back pain: Secondary | ICD-10-CM | POA: Diagnosis not present

## 2017-10-09 DIAGNOSIS — E039 Hypothyroidism, unspecified: Secondary | ICD-10-CM | POA: Diagnosis not present

## 2017-10-09 DIAGNOSIS — R32 Unspecified urinary incontinence: Secondary | ICD-10-CM | POA: Diagnosis not present

## 2017-10-09 DIAGNOSIS — R531 Weakness: Secondary | ICD-10-CM | POA: Diagnosis not present

## 2017-10-09 DIAGNOSIS — J45909 Unspecified asthma, uncomplicated: Secondary | ICD-10-CM | POA: Diagnosis not present

## 2017-10-09 DIAGNOSIS — I9589 Other hypotension: Secondary | ICD-10-CM | POA: Diagnosis not present

## 2017-10-09 DIAGNOSIS — E119 Type 2 diabetes mellitus without complications: Secondary | ICD-10-CM | POA: Diagnosis not present

## 2017-10-09 DIAGNOSIS — M4726 Other spondylosis with radiculopathy, lumbar region: Secondary | ICD-10-CM | POA: Diagnosis not present

## 2017-10-10 DIAGNOSIS — E119 Type 2 diabetes mellitus without complications: Secondary | ICD-10-CM | POA: Diagnosis not present

## 2017-10-10 DIAGNOSIS — M5124 Other intervertebral disc displacement, thoracic region: Secondary | ICD-10-CM | POA: Diagnosis not present

## 2017-10-10 DIAGNOSIS — R32 Unspecified urinary incontinence: Secondary | ICD-10-CM | POA: Diagnosis not present

## 2017-10-10 DIAGNOSIS — I10 Essential (primary) hypertension: Secondary | ICD-10-CM | POA: Diagnosis not present

## 2017-10-10 DIAGNOSIS — M545 Low back pain: Secondary | ICD-10-CM | POA: Diagnosis not present

## 2017-10-10 DIAGNOSIS — M4726 Other spondylosis with radiculopathy, lumbar region: Secondary | ICD-10-CM | POA: Diagnosis not present

## 2017-10-10 DIAGNOSIS — R159 Full incontinence of feces: Secondary | ICD-10-CM | POA: Diagnosis not present

## 2017-10-11 DIAGNOSIS — R32 Unspecified urinary incontinence: Secondary | ICD-10-CM | POA: Diagnosis not present

## 2017-10-11 DIAGNOSIS — M4726 Other spondylosis with radiculopathy, lumbar region: Secondary | ICD-10-CM | POA: Diagnosis not present

## 2017-10-11 DIAGNOSIS — I10 Essential (primary) hypertension: Secondary | ICD-10-CM | POA: Diagnosis not present

## 2017-10-11 DIAGNOSIS — R159 Full incontinence of feces: Secondary | ICD-10-CM | POA: Diagnosis not present

## 2017-10-11 DIAGNOSIS — E119 Type 2 diabetes mellitus without complications: Secondary | ICD-10-CM | POA: Diagnosis not present

## 2017-10-15 ENCOUNTER — Telehealth: Payer: Self-pay | Admitting: Gastroenterology

## 2017-10-15 DIAGNOSIS — Z6830 Body mass index (BMI) 30.0-30.9, adult: Secondary | ICD-10-CM | POA: Diagnosis not present

## 2017-10-15 DIAGNOSIS — M549 Dorsalgia, unspecified: Secondary | ICD-10-CM | POA: Diagnosis not present

## 2017-10-16 MED ORDER — LINACLOTIDE 145 MCG PO CAPS: 145.0000 ug | ORAL_CAPSULE | Freq: Every day | ORAL | 3 refills | Status: DC

## 2017-10-16 NOTE — Telephone Encounter (Signed)
[facility]ess [facility] mcg [facility] [place]

## 2017-10-29 DIAGNOSIS — M47812 Spondylosis without myelopathy or radiculopathy, cervical region: Secondary | ICD-10-CM | POA: Diagnosis not present

## 2017-10-29 DIAGNOSIS — M545 Low back pain: Secondary | ICD-10-CM | POA: Diagnosis not present

## 2017-10-29 DIAGNOSIS — M5136 Other intervertebral disc degeneration, lumbar region: Secondary | ICD-10-CM | POA: Diagnosis not present

## 2017-10-29 DIAGNOSIS — Z6831 Body mass index (BMI) 31.0-31.9, adult: Secondary | ICD-10-CM | POA: Diagnosis not present

## 2017-10-29 DIAGNOSIS — I10 Essential (primary) hypertension: Secondary | ICD-10-CM | POA: Diagnosis not present

## 2017-10-29 DIAGNOSIS — M546 Pain in thoracic spine: Secondary | ICD-10-CM | POA: Diagnosis not present

## 2017-10-29 DIAGNOSIS — G8929 Other chronic pain: Secondary | ICD-10-CM | POA: Diagnosis not present

## 2017-10-29 DIAGNOSIS — M542 Cervicalgia: Secondary | ICD-10-CM | POA: Diagnosis not present

## 2017-11-06 ENCOUNTER — Other Ambulatory Visit: Payer: Self-pay | Admitting: Neurosurgery

## 2017-11-06 DIAGNOSIS — G8929 Other chronic pain: Secondary | ICD-10-CM

## 2017-11-06 DIAGNOSIS — M545 Low back pain: Secondary | ICD-10-CM

## 2017-11-06 DIAGNOSIS — M542 Cervicalgia: Principal | ICD-10-CM

## 2017-11-06 DIAGNOSIS — M546 Pain in thoracic spine: Secondary | ICD-10-CM

## 2017-11-07 DIAGNOSIS — R69 Illness, unspecified: Secondary | ICD-10-CM | POA: Diagnosis not present

## 2017-11-07 DIAGNOSIS — I10 Essential (primary) hypertension: Secondary | ICD-10-CM | POA: Diagnosis not present

## 2017-11-07 DIAGNOSIS — E1121 Type 2 diabetes mellitus with diabetic nephropathy: Secondary | ICD-10-CM | POA: Diagnosis not present

## 2017-11-07 DIAGNOSIS — K210 Gastro-esophageal reflux disease with esophagitis: Secondary | ICD-10-CM | POA: Diagnosis not present

## 2017-11-19 ENCOUNTER — Ambulatory Visit
Admission: RE | Admit: 2017-11-19 | Discharge: 2017-11-19 | Disposition: A | Discharge status: Home / Self Care | Payer: Medicare HMO | Source: Ambulatory Visit | Attending: Neurosurgery | Admitting: Neurosurgery

## 2017-11-19 DIAGNOSIS — G8929 Other chronic pain: Secondary | ICD-10-CM

## 2017-11-19 DIAGNOSIS — M545 Low back pain: Secondary | ICD-10-CM

## 2017-11-19 DIAGNOSIS — M542 Cervicalgia: Principal | ICD-10-CM

## 2017-11-19 DIAGNOSIS — M546 Pain in thoracic spine: Secondary | ICD-10-CM

## 2017-11-19 MED ORDER — HYDROMORPHONE HCL 1 MG/ML IJ SOLN
1.0000 mg | Freq: Once | INTRAMUSCULAR | Status: AC
  Administered 2017-11-19: 1 mg via INTRAMUSCULAR

## 2017-11-19 MED ORDER — DIAZEPAM 5 MG PO TABS
5.0000 mg | ORAL_TABLET | Freq: Once | ORAL | Status: AC
  Administered 2017-11-19: 5 mg via ORAL

## 2017-11-19 MED ORDER — IOPAMIDOL (ISOVUE-M 300) INJECTION 61%
10.0000 mL | Freq: Once | INTRAMUSCULAR | Status: AC | PRN
  Administered 2017-11-19: 10 mL via INTRATHECAL

## 2017-11-19 MED ORDER — ONDANSETRON HCL 4 MG/2ML IJ SOLN
4.0000 mg | Freq: Once | INTRAMUSCULAR | Status: AC
  Administered 2017-11-19: 4 mg via INTRAMUSCULAR

## 2017-11-19 NOTE — Progress Notes (Signed)
Patient states she has been off Cymbalta for at least the past two days.  Gypsy Lore, RN

## 2017-11-19 NOTE — Discharge Instructions (Signed)

## 2017-11-20 DIAGNOSIS — M545 Low back pain: Secondary | ICD-10-CM | POA: Diagnosis not present

## 2017-11-20 DIAGNOSIS — I10 Essential (primary) hypertension: Secondary | ICD-10-CM | POA: Diagnosis not present

## 2017-11-20 DIAGNOSIS — M5416 Radiculopathy, lumbar region: Secondary | ICD-10-CM | POA: Diagnosis not present

## 2017-11-20 DIAGNOSIS — Z6828 Body mass index (BMI) 28.0-28.9, adult: Secondary | ICD-10-CM | POA: Diagnosis not present

## 2017-11-20 DIAGNOSIS — M4316 Spondylolisthesis, lumbar region: Secondary | ICD-10-CM | POA: Diagnosis not present

## 2017-11-20 DIAGNOSIS — S129XXD Fracture of neck, unspecified, subsequent encounter: Secondary | ICD-10-CM | POA: Diagnosis not present

## 2017-12-05 DIAGNOSIS — M549 Dorsalgia, unspecified: Secondary | ICD-10-CM | POA: Diagnosis not present

## 2017-12-05 DIAGNOSIS — Z6830 Body mass index (BMI) 30.0-30.9, adult: Secondary | ICD-10-CM | POA: Diagnosis not present

## 2017-12-05 DIAGNOSIS — I10 Essential (primary) hypertension: Secondary | ICD-10-CM | POA: Diagnosis not present

## 2017-12-05 DIAGNOSIS — E1122 Type 2 diabetes mellitus with diabetic chronic kidney disease: Secondary | ICD-10-CM | POA: Diagnosis not present

## 2017-12-05 DIAGNOSIS — Z0000 Encounter for general adult medical examination without abnormal findings: Secondary | ICD-10-CM | POA: Diagnosis not present

## 2017-12-05 DIAGNOSIS — Z1389 Encounter for screening for other disorder: Secondary | ICD-10-CM | POA: Diagnosis not present

## 2017-12-05 DIAGNOSIS — N181 Chronic kidney disease, stage 1: Secondary | ICD-10-CM | POA: Diagnosis not present

## 2017-12-07 DIAGNOSIS — R69 Illness, unspecified: Secondary | ICD-10-CM | POA: Diagnosis not present

## 2018-01-20 ENCOUNTER — Other Ambulatory Visit: Payer: Self-pay

## 2018-01-20 DIAGNOSIS — I6523 Occlusion and stenosis of bilateral carotid arteries: Secondary | ICD-10-CM

## 2018-01-21 ENCOUNTER — Other Ambulatory Visit: Payer: Self-pay

## 2018-01-21 ENCOUNTER — Ambulatory Visit (HOSPITAL_COMMUNITY)
Admission: RE | Admit: 2018-01-21 | Discharge: 2018-01-21 | Disposition: A | Discharge status: Home / Self Care | Payer: Medicare HMO | Source: Ambulatory Visit | Attending: Family | Admitting: Family

## 2018-01-21 ENCOUNTER — Encounter: Payer: Self-pay | Admitting: Family

## 2018-01-21 ENCOUNTER — Ambulatory Visit: Payer: Medicare HMO | Admitting: Family

## 2018-01-21 VITALS — BP 118/73 | HR 63 | Resp 18 | Ht 67.5 in | Wt 182.0 lb

## 2018-01-21 DIAGNOSIS — I6523 Occlusion and stenosis of bilateral carotid arteries: Secondary | ICD-10-CM | POA: Diagnosis not present

## 2018-01-21 NOTE — Progress Notes (Signed)
Chief Complaint: Follow up Extracranial Carotid Artery Stenosis   History of Present Illness  MARYJAYNE KLEVEN is a 77 y.o. female who Dr. Donnetta Hutching for mild to moderate left carotid stenosis with elevated velocities related to tortuosity of the vessel.   Patient has not had previous carotid artery intervention.  The patient denies any history of TIA or stroke symptoms.Specifically she denies any history of amaurosis fugax or monocular blindness, unilateral facial drooping, hemiplegia, or receptive or expressive aphasia.  Pt states she has plaque and kinking in her coronary arteries, she is unsure if she has had an MI.  Has chronic c-spine issues, seeing ortho for this, has intermittent tingling, numbness, weakness in her left arm. She also has known lumbar disc disease which affects her left leg more so than right. She has fibromyalgia, denies non healing wounds. She has trouble with her balance. She does not seem to have claudication in her legs with walking.    Pt Diabetic: Yes, pt states her last A1C was about 7.?  Pt smoker: non-smoker  Pt meds include: Statin : Yes ASA: Yes Other anticoagulants/antiplatelets: no      Past Medical History:  Diagnosis Date  . Allergy   . Anemia   . Anginal pain ([place])   . Anxiety   . Arthritis   . Blood transfusion without reported diagnosis   . Breast cancer ([place])   . Carotid artery plaque    bilat  . Cataract   . Colon polyps    adenomatous  . Concussion 02/2016   Driving restrictions for 6w  . Coronary heart disease   . Depression   . Diverticulosis   . Dizziness   . Esophageal reflux   . Esophageal stricture   . Fibromyalgia   . Gallstones   . Hiatal hernia   . Hypercholesterolemia   . Hypertension   . Hypothyroidism   . IBS (irritable bowel syndrome)   . Kidney stones   . Osteoporosis   . Pneumonia    recently, just finished ABO  . Pneumonia   . PONV (postoperative nausea and vomiting)   . Sleep apnea     CPAP  . Thyroid disease 2002  . Type II or unspecified type diabetes mellitus without mention of complication, not stated as uncontrolled   . Unspecified gastritis and gastroduodenitis without mention of hemorrhage     Social History Social History   Tobacco Use  . Smoking status: Never Smoker  . Smokeless tobacco: Never Used  Substance Use Topics  . Alcohol use: No  . Drug use: No    Family History Family History  Problem Relation Age of Onset  . Sarcoidosis Mother   . Diabetes Mother   . Cancer Mother        sarcoma -  Right arm amputation  . Hypertension Mother   . Lung cancer Father   . Diabetes Father   . Heart disease Father        Before age 93  . Urolithiasis Father   . Cancer Father        Lung  . Hyperlipidemia Father   . Hypertension Father   . Esophageal cancer Maternal Uncle   . Ovarian cancer Maternal Aunt   . Colon cancer Maternal Uncle   . Lung cancer Maternal Uncle   . Diabetes Maternal Uncle   . Diabetes Maternal Aunt   . Colon polyps Maternal Uncle   . Heart disease Paternal Grandmother     Surgical History Past  Surgical History:  Procedure Laterality Date  . ANAL RECTAL MANOMETRY N/A 08/05/2015   Procedure: ANO RECTAL MANOMETRY;  Surgeon: Mauri Pole, MD;  Location: [facility];  Service: Endoscopy;  Laterality: N/A;  . APPENDECTOMY    . BREAST LUMPECTOMY     left  . CARDIAC CATHETERIZATION    . CATARACT EXTRACTION W/PHACO Left 03/30/2013   Procedure: CATARACT EXTRACTION PHACO AND INTRAOCULAR LENS PLACEMENT (IOC);  Surgeon: Tonny Branch, MD;  Location: [facility];  Service: Ophthalmology;  Laterality: Left;  CDE:19.28  . CATARACT EXTRACTION W/PHACO Right 04/09/2013   Procedure: CATARACT EXTRACTION PHACO AND INTRAOCULAR LENS PLACEMENT (IOC);  Surgeon: Tonny Branch, MD;  Location: [facility];  Service: Ophthalmology;  Laterality: Right;  CDE: 15.44  . CERVICAL FUSION    . CESAREAN SECTION    . CHOLECYSTECTOMY    . CYSTOCELE REPAIR    .  DILATION AND CURETTAGE OF UTERUS    . EYE SURGERY Left 03-30-13   Cataract  . EYE SURGERY Right 04-09-13   Cataract  . RECTOCELE REPAIR    . THYROIDECTOMY    . TONSILLECTOMY    . TOTAL ABDOMINAL HYSTERECTOMY      Allergies  Allergen Reactions  . Codeine Hives  . Sulfonamide Derivatives Hives    Current Outpatient Medications  Medication Sig Dispense Refill  . aspirin 81 MG chewable tablet Chew 81 mg by mouth daily.    Marland Kitchen atorvastatin (LIPITOR) 20 MG tablet Take 1 tablet (20 mg total) by mouth daily. 90 tablet 3  . BELSOMRA 10 MG TABS Take 1 tablet by mouth daily.   0  . calcium-vitamin D (OSCAL WITH D) 500-200 MG-UNIT per tablet Take 1 tablet by mouth 2 (two) times daily.    . Coenzyme Q10 (CO Q-10) 100 MG CAPS Take 100 mg by mouth daily.    . cyanocobalamin (,VITAMIN B-12,) 1000 MCG/ML injection INJECT 1 ML INTO MUSCLE ONCE A MONTH 3 mL 3  . DULoxetine (CYMBALTA) 60 MG capsule Take 60 mg by mouth daily.    Marland Kitchen EPINEPHrine 0.3 mg/0.3 mL IJ SOAJ injection Frequency:PHARMDIR   Dosage:0.0     Instructions:  Note:self inject in the event of allergic reaction, then proceed immediately to the nearest Emergency Room. Dose: 1 INJECTION    . fentaNYL (DURAGESIC - DOSED MCG/HR) 25 MCG/HR patch Place 25 mcg onto the skin every 3 (three) days.    . fexofenadine (ALLEGRA) 30 MG tablet Take 30 mg by mouth daily.    . fluticasone (FLONASE) 50 MCG/ACT nasal spray Place 2 sprays into the nose daily.    . furosemide (LASIX) 40 MG tablet Take 1 pill daily for 3 days then as needed for swelling 30 tablet 6  . gabapentin (NEURONTIN) 300 MG capsule Take 300 mg by mouth at bedtime.    Marland Kitchen glipiZIDE (GLUCOTROL) 10 MG tablet Take 10 mg by mouth daily before breakfast.    . Insulin Glargine-Lixisenatide (SOLIQUA) 100-33 UNT-MCG/ML SOPN Inject 20 Units into the skin.    . Insulin Lispro Prot & Lispro (HUMALOG MIX 75/25 KWIKPEN) (75-25) 100 UNIT/ML Kwikpen Inject 4-5 Units into the skin 3 (three) times daily.     Marland Kitchen  LEVEMIR FLEXTOUCH 100 UNIT/ML Pen 20 Units daily at 10 pm.     . levothyroxine (SYNTHROID, LEVOTHROID) 125 MCG tablet Take 1 tablet by mouth daily.    Marland Kitchen lidocaine (LIDODERM) 5 % Place 1 patch onto the skin daily. Remove & Discard patch within 12 hours or as directed by MD    .  linaclotide (LINZESS) 145 MCG CAPS capsule Take 1 capsule (145 mcg total) by mouth daily before breakfast. 30 capsule 3  . Liraglutide (VICTOZA) 18 MG/3ML SOPN INJECT 0.2 MLS (1.2 MG TOTAL) INTO THE SKIN DAILY.    . Melatonin 10 MG TABS Take by mouth.    . metFORMIN (GLUCOPHAGE) 1000 MG tablet Take 1,000 mg by mouth 2 (two) times daily with a meal.    . methocarbamol (ROBAXIN) 750 MG tablet Take 750 mg by mouth every 8 (eight) hours.  1  . metoprolol succinate (TOPROL-XL) 50 MG 24 hr tablet TAKE 1 TABLET (50 MG TOTAL) BY MOUTH AT BEDTIME. 90 tablet 3  . midodrine (PROAMATINE) 2.5 MG tablet Take 2.5 mg by mouth 3 (three) times daily.  0  . montelukast (SINGULAIR) 10 MG tablet Take 10 mg by mouth at bedtime.    . naproxen sodium (ALEVE) 220 MG tablet Take 220 mg by mouth.    . Needles & Syringes MISC 25G 5/8 inch needle on 3 ml syringe-Use once monthly for B12 injections 12 each 0  . nitroGLYCERIN (NITROSTAT) 0.4 MG SL tablet Place 0.4 mg under the tongue every 5 (five) minutes as needed for chest pain.     . Omega-3 Fatty Acids (FISH OIL PO) Take by mouth.    Glory Rosebush VERIO test strip 1 each by Other route as directed.     . pantoprazole (PROTONIX) 40 MG tablet Take 1 tablet (40 mg total) by mouth 2 (two) times daily. 180 tablet 3  . potassium chloride (K-DUR,KLOR-CON) 10 MEQ tablet Take 10 mEq by mouth once. Take one tablet every time you take lasix    . ramipril (ALTACE) 5 MG capsule Take 1 capsule (5 mg total) by mouth at bedtime. 90 capsule 3  . VAGIFEM 10 MCG TABS vaginal tablet Place 1 tablet vaginally once a week.   0  . linaclotide (LINZESS) 290 MCG CAPS capsule Take 1 capsule (290 mcg total) by mouth daily.  (Patient not taking: Reported on 01/21/2018) 90 capsule 1  . ondansetron (ZOFRAN) 4 MG tablet Take 1 tablet (4 mg total) by mouth every 8 (eight) hours as needed for nausea or vomiting. (Patient not taking: Reported on 11/06/2017) 30 tablet 1   Current Facility-Administered Medications  Medication Dose Route Frequency Provider Last Rate Last Dose  . 0.9 %  sodium chloride infusion  500 mL Intravenous Continuous Nandigam, Kavitha V, MD      . 0.9 %  sodium chloride infusion  500 mL Intravenous Continuous Nandigam, Venia Minks, MD        Review of Systems : See HPI for pertinent positives and negatives.  Physical Examination  Vitals:   01/21/18 1153  BP: 118/73  Pulse: 63  Resp: 18  SpO2: 95%  Weight: 182 lb (82.6 kg)  Height: 5' 7.5" (1.715 m)   Body mass index is 28.08 kg/m.  General: WDWN obese female in NAD GAIT:slow, deliberate Eyes: PERRLA Pulmonary: Non-labored, CTAB, no rales, no rhonchi, &no wheezing.  Cardiac: regular rhythm, no detected murmur.  VASCULAR EXAM Carotid Bruits Left Right   Negative Negative    Radial pulses are 2+ palpable and equal.      LE Pulses LEFT RIGHT   POPLITEAL not palpable  not palpable   POSTERIOR TIBIAL  not palpable  not palpable    DORSALIS PEDIS  ANTERIOR TIBIAL palpable  palpable     Gastrointestinal: soft, nontender, BS WNL, no r/g,no palpable masses.  Musculoskeletal: No muscle atrophy/wasting. M/S  5/5 in upper extremities, 4/5 in lower extremities, Extremities without ischemic changes.  Skin: No rashes, no ulcers, no cellulitis.   Neurologic:  A&O X 3; appropriate affect, sensation is normal; speech is normal, CN 2-12 intact, pain and light touch intact in extremities, motor exam as listed above. Psychiatric: Normal thought content, mood  appropriate to clinical situation.    Assessment: DEAIRA LECKEY is a 77 y.o. female who has no history of stroke or TIA.  DATA Carotid Duplex (01/21/18): Right ICA: 1-39% stenosis Left ICA: 40-59% stenosis Bilateral vertebral artery flow is antegrade.  Bilateral subclavian artery waveforms are normal.  Increased stenosis in the left ICA compared to the exam on 07-24-16.    Plan:  Follow-up in 1 year with Carotid Duplex scan.  I discussed in depth with the patient the nature of atherosclerosis, and emphasized the importance of maximal medical management including strict control of blood pressure, blood glucose, and lipid levels, obtaining regular exercise, and continued cessation of smoking.  The patient is aware that without maximal medical management the underlying atherosclerotic disease process will progress, limiting the benefit of any interventions. The patient was given information about stroke prevention and what symptoms should prompt the patient to seek immediate medical care. Thank you for allowing [place] to participate in this patient's care.  Clemon Chambers, RN, MSN, FNP-C [facility] Office: [phone]  Clinic Physician: Oneida Alar  01/21/18 12:08 PM

## 2018-01-21 NOTE — Patient Instructions (Signed)

## 2018-01-23 ENCOUNTER — Other Ambulatory Visit: Payer: Self-pay | Admitting: Cardiovascular Disease

## 2018-02-03 DIAGNOSIS — M549 Dorsalgia, unspecified: Secondary | ICD-10-CM | POA: Diagnosis not present

## 2018-02-03 DIAGNOSIS — I10 Essential (primary) hypertension: Secondary | ICD-10-CM | POA: Diagnosis not present

## 2018-02-03 DIAGNOSIS — Z6830 Body mass index (BMI) 30.0-30.9, adult: Secondary | ICD-10-CM | POA: Diagnosis not present

## 2018-02-03 DIAGNOSIS — E1122 Type 2 diabetes mellitus with diabetic chronic kidney disease: Secondary | ICD-10-CM | POA: Diagnosis not present

## 2018-02-03 DIAGNOSIS — Z1389 Encounter for screening for other disorder: Secondary | ICD-10-CM | POA: Diagnosis not present

## 2018-02-03 DIAGNOSIS — Z0000 Encounter for general adult medical examination without abnormal findings: Secondary | ICD-10-CM | POA: Diagnosis not present

## 2018-02-03 DIAGNOSIS — Z6829 Body mass index (BMI) 29.0-29.9, adult: Secondary | ICD-10-CM | POA: Diagnosis not present

## 2018-02-03 DIAGNOSIS — N181 Chronic kidney disease, stage 1: Secondary | ICD-10-CM | POA: Diagnosis not present

## 2018-02-27 ENCOUNTER — Other Ambulatory Visit: Payer: Self-pay | Admitting: Gastroenterology

## 2018-03-06 ENCOUNTER — Encounter: Payer: Self-pay | Admitting: Cardiovascular Disease

## 2018-03-06 ENCOUNTER — Ambulatory Visit: Payer: Medicare HMO | Admitting: Cardiovascular Disease

## 2018-03-06 VITALS — BP 100/60 | HR 60 | Ht 67.0 in | Wt 175.0 lb

## 2018-03-06 DIAGNOSIS — D2272 Melanocytic nevi of left lower limb, including hip: Secondary | ICD-10-CM | POA: Diagnosis not present

## 2018-03-06 DIAGNOSIS — D1801 Hemangioma of skin and subcutaneous tissue: Secondary | ICD-10-CM | POA: Diagnosis not present

## 2018-03-06 DIAGNOSIS — L570 Actinic keratosis: Secondary | ICD-10-CM | POA: Diagnosis not present

## 2018-03-06 DIAGNOSIS — D2271 Melanocytic nevi of right lower limb, including hip: Secondary | ICD-10-CM | POA: Diagnosis not present

## 2018-03-06 DIAGNOSIS — I25118 Atherosclerotic heart disease of native coronary artery with other forms of angina pectoris: Secondary | ICD-10-CM

## 2018-03-06 DIAGNOSIS — L821 Other seborrheic keratosis: Secondary | ICD-10-CM | POA: Diagnosis not present

## 2018-03-06 DIAGNOSIS — L853 Xerosis cutis: Secondary | ICD-10-CM | POA: Diagnosis not present

## 2018-03-06 NOTE — Progress Notes (Signed)
Cardiology Office Note Date:  03/06/2018   ID:  Jeanne Kim, DOB [birth year], MRN [id number]  PCP:  Neale Burly, MD  Cardiologist:  Sherren Mocha, MD    No chief complaint on file.    History of Present Illness: Jeanne Kim is a 77 y.o. female who presents for follow-up of nonobstructive CAD. She was last seen one year ago. She underwent heart catheterization in 2006 demonstrating mild nonobstructive CAD with 20-30% stenosis of the left main and LAD. There is no other significant disease noted. Left ventricular function was normal. She has carotid stenosis which has been graded at mild to moderate and she is followed by Dr. Donnetta Hutching.  The patient is here alone today.  She has occasional chest pain at rest, but states symptoms are mild and she has not been concerned.  There is no change in the pattern.  Chest pain is located on the left side when it occurs and it is nonradiating.  No shortness of breath, heart palpitations, lightheadedness, or syncope.  She has edema in her legs at times.  She has used Lasix on an as-needed basis.  She denies orthopnea or PND.  She has had a lot of problems related to her back and neck.  She has issues with her balance.  She is been evaluated by neurosurgery and she plans on conservative management at this time.  Past Medical History:  Diagnosis Date  . Allergy   . Anemia   . Anginal pain ([place])   . Anxiety   . Arthritis   . Blood transfusion without reported diagnosis   . Breast cancer ([place])   . Carotid artery plaque    bilat  . Cataract   . Colon polyps    adenomatous  . Concussion 02/2016   Driving restrictions for 6w  . Coronary heart disease   . Depression   . Diverticulosis   . Dizziness   . Esophageal reflux   . Esophageal stricture   . Fibromyalgia   . Gallstones   . Hiatal hernia   . Hypercholesterolemia   . Hypertension   . Hypothyroidism   . IBS (irritable bowel syndrome)   . Kidney stones   . Osteoporosis   .  Pneumonia    recently, just finished ABO  . Pneumonia   . PONV (postoperative nausea and vomiting)   . Sleep apnea    CPAP  . Thyroid disease 2002  . Type II or unspecified type diabetes mellitus without mention of complication, not stated as uncontrolled   . Unspecified gastritis and gastroduodenitis without mention of hemorrhage     Past Surgical History:  Procedure Laterality Date  . ANAL RECTAL MANOMETRY N/A 08/05/2015   Procedure: ANO RECTAL MANOMETRY;  Surgeon: Mauri Pole, MD;  [facility];  Service: Endoscopy;  Laterality: N/A;  . APPENDECTOMY    . BREAST LUMPECTOMY     left  . CARDIAC CATHETERIZATION    . CATARACT EXTRACTION W/PHACO Left 03/30/2013   Procedure: CATARACT EXTRACTION PHACO AND INTRAOCULAR LENS PLACEMENT (IOC);  Surgeon: Tonny Branch, MD;  Location: [facility];  Service: Ophthalmology;  Laterality: Left;  CDE:19.28  . CATARACT EXTRACTION W/PHACO Right 04/09/2013   Procedure: CATARACT EXTRACTION PHACO AND INTRAOCULAR LENS PLACEMENT (IOC);  Surgeon: Tonny Branch, MD;  Location: [facility];  Service: Ophthalmology;  Laterality: Right;  CDE: 15.44  . CERVICAL FUSION    . CESAREAN SECTION    . CHOLECYSTECTOMY    . CYSTOCELE REPAIR    .  DILATION AND CURETTAGE OF UTERUS    . EYE SURGERY Left 03-30-13   Cataract  . EYE SURGERY Right 04-09-13   Cataract  . RECTOCELE REPAIR    . THYROIDECTOMY    . TONSILLECTOMY    . TOTAL ABDOMINAL HYSTERECTOMY      Current Outpatient Medications  Medication Sig Dispense Refill  . aspirin 81 MG chewable tablet Chew 81 mg by mouth daily.    Marland Kitchen atorvastatin (LIPITOR) 20 MG tablet Take 1 tablet (20 mg total) by mouth daily. 90 tablet 3  . BELSOMRA 10 MG TABS Take 1 tablet by mouth daily.   0  . calcium-vitamin D (OSCAL WITH D) 500-200 MG-UNIT per tablet Take 1 tablet by mouth 2 (two) times daily.    . Coenzyme Q10 (CO Q-10) 100 MG CAPS Take 100 mg by mouth daily.    . cyanocobalamin (,VITAMIN B-12,) 1000 MCG/ML injection INJECT 1  ML INTO MUSCLE ONCE A MONTH 3 mL 3  . DULoxetine (CYMBALTA) 60 MG capsule Take 60 mg by mouth daily.    Marland Kitchen EPINEPHrine 0.3 mg/0.3 mL IJ SOAJ injection Frequency:PHARMDIR   Dosage:0.0     Instructions:  Note:self inject in the event of allergic reaction, then proceed immediately to the nearest Emergency Room. Dose: 1 INJECTION    . fentaNYL (DURAGESIC - DOSED MCG/HR) 25 MCG/HR patch Place 25 mcg onto the skin every 3 (three) days.    . fexofenadine (ALLEGRA) 30 MG tablet Take 30 mg by mouth daily.    . fluticasone (FLONASE) 50 MCG/ACT nasal spray Place 2 sprays into the nose daily.    . furosemide (LASIX) 40 MG tablet Take 1 pill daily for 3 days then as needed for swelling 30 tablet 6  . gabapentin (NEURONTIN) 300 MG capsule Take 300 mg by mouth at bedtime.    Marland Kitchen glipiZIDE (GLUCOTROL) 10 MG tablet Take 10 mg by mouth daily before breakfast.    . Insulin Glargine-Lixisenatide (SOLIQUA) 100-33 UNT-MCG/ML SOPN Inject 20 Units into the skin.    . Insulin Lispro Prot & Lispro (HUMALOG MIX 75/25 KWIKPEN) (75-25) 100 UNIT/ML Kwikpen Inject 4-5 Units into the skin 3 (three) times daily.     Marland Kitchen LEVEMIR FLEXTOUCH 100 UNIT/ML Pen 20 Units daily at 10 pm.     . levothyroxine (SYNTHROID, LEVOTHROID) 125 MCG tablet Take 1 tablet by mouth daily.    Marland Kitchen lidocaine (LIDODERM) 5 % Place 1 patch onto the skin daily. Remove & Discard patch within 12 hours or as directed by MD    . linaclotide (LINZESS) 145 MCG CAPS capsule Take 1 capsule (145 mcg total) by mouth daily before breakfast. 30 capsule 3  . linaclotide (LINZESS) 290 MCG CAPS capsule Take 1 capsule (290 mcg total) by mouth daily. 90 capsule 1  . Liraglutide (VICTOZA) 18 MG/3ML SOPN INJECT 0.2 MLS (1.2 MG TOTAL) INTO THE SKIN DAILY.    . Melatonin 10 MG TABS Take by mouth.    . metFORMIN (GLUCOPHAGE) 1000 MG tablet Take 1,000 mg by mouth 2 (two) times daily with a meal.    . methocarbamol (ROBAXIN) 750 MG tablet Take 750 mg by mouth every 8 (eight) hours.  1  .  metoprolol succinate (TOPROL-XL) 50 MG 24 hr tablet Take 1 tablet (50 mg total) by mouth daily. Please keep upcoming appt with Dr. Burt Knack. Thank you 30 tablet 0  . midodrine (PROAMATINE) 2.5 MG tablet Take 2.5 mg by mouth 3 (three) times daily.  0  . montelukast (SINGULAIR) 10  MG tablet Take 10 mg by mouth at bedtime.    . naproxen sodium (ALEVE) 220 MG tablet Take 220 mg by mouth.    . Needles & Syringes MISC 25G 5/8 inch needle on 3 ml syringe-Use once monthly for B12 injections 12 each 0  . nitroGLYCERIN (NITROSTAT) 0.4 MG SL tablet Place 0.4 mg under the tongue every 5 (five) minutes as needed for chest pain.     . Omega-3 Fatty Acids (FISH OIL PO) Take by mouth.    . ondansetron (ZOFRAN) 4 MG tablet Take 1 tablet (4 mg total) by mouth every 8 (eight) hours as needed for nausea or vomiting. 30 tablet 1  . ONETOUCH VERIO test strip 1 each by Other route as directed.     . pantoprazole (PROTONIX) 40 MG tablet TAKE 1 TABLET (40 MG TOTAL) BY MOUTH 2 (TWO) TIMES DAILY. 180 tablet 3  . potassium chloride (K-DUR,KLOR-CON) 10 MEQ tablet Take 10 mEq by mouth once. Take one tablet every time you take lasix    . ramipril (ALTACE) 5 MG capsule Take 1 capsule (5 mg total) by mouth at bedtime. 90 capsule 3  . VAGIFEM 10 MCG TABS vaginal tablet Place 1 tablet vaginally once a week.   0   Current Facility-Administered Medications  Medication Dose Route Frequency Provider Last Rate Last Dose  . 0.9 %  sodium chloride infusion  500 mL Intravenous Continuous Nandigam, Kavitha V, MD      . 0.9 %  sodium chloride infusion  500 mL Intravenous Continuous Nandigam, Venia Minks, MD        Allergies:   Codeine and Sulfonamide derivatives   Social History:  The patient  reports that she has never smoked. She has never used smokeless tobacco. She reports that she does not drink alcohol or use drugs.   Family History:  The patient's family history includes Cancer in her father and mother; Colon cancer in her maternal  uncle; Colon polyps in her maternal uncle; Diabetes in her father, maternal aunt, maternal uncle, and mother; Esophageal cancer in her maternal uncle; Heart disease in her father and paternal grandmother; Hyperlipidemia in her father; Hypertension in her father and mother; Lung cancer in her father and maternal uncle; Ovarian cancer in her maternal aunt; Sarcoidosis in her mother; Urolithiasis in her father.    ROS:  Please see the history of present illness.  Otherwise, review of systems is positive for leg swelling, hearing loss, visual disturbance, back pain, muscle pain, dizziness, balance problems.  All other systems are reviewed and negative.    PHYSICAL EXAM: VS:  BP 100/60   Pulse 60   Ht 5\' 7"  (1.702 m)   Wt 175 lb (79.4 kg)   BMI 27.41 kg/m  , BMI Body mass index is 27.41 kg/m. GEN: Well nourished, well developed, in no acute distress  HEENT: normal  Neck: no JVD, no masses. No carotid bruits Cardiac: RRR without murmur or gallop                Respiratory:  clear to auscultation bilaterally, normal work of breathing GI: soft, nontender, nondistended, + BS MS: no deformity or atrophy  Ext: no pretibial edema, pedal pulses 2+= bilaterally Skin: warm and dry, no rash Neuro:  Strength and sensation are intact Psych: euthymic mood, full affect  EKG:  EKG is ordered today. The ekg ordered today shows sinus bradycardia 58 bpm, cannot rule out age-indeterminate anterior infarct  Recent Labs: No results found for requested  labs within last 8760 hours.   Lipid Panel     Component Value Date/Time   CHOL 123 01/20/2013 1626   TRIG 255.0 (H) 01/20/2013 1626   HDL 41.10 01/20/2013 1626   CHOLHDL 3 01/20/2013 1626   VLDL 51.0 (H) 01/20/2013 1626   LDLCALC 41 12/28/2008 1140   LDLDIRECT 46.2 01/20/2013 1626      Wt Readings from Last 3 Encounters:  03/06/18 175 lb (79.4 kg)  01/21/18 182 lb (82.6 kg)  02/13/17 184 lb (83.5 kg)     Cardiac Studies Reviewed: Nuclear  Stress Test 01/10/2016: Study Highlights    Nuclear stress EF: 65%.  There was no ST segment deviation noted during stress.  The study is normal.  The left ventricular ejection fraction is normal (55-65%).   Normal stress nuclear study with no ischemia or infarction; EF 65 with normal wall motion.    ASSESSMENT AND PLAN: 1.  Coronary artery disease, native vessel, with angina: The patient has occasional symptoms of chest discomfort with no change in pattern.  Her nitroglycerin is refilled today.  She is been noted to have mild nonobstructive disease on previous heart catheterization.  Her last stress test from 2017 is reviewed and it demonstrated no ischemia.  She should remain on aspirin and a statin drug.  2.  Type 2 diabetes: Treated with insulin.  Followed by her primary care physician.  3.  Hypertension: Blood pressure is controlled on metoprolol and Altace.  4.  Hyperlipidemia: Treated with atorvastatin.  Last lipids showed a cholesterol of 131, HDL 37, LDL not done, triglycerides 277.  Lifestyle modification discussed.  Current medicines are reviewed with the patient today.  The patient does not have concerns regarding medicines.  Labs/ tests ordered today include:   Orders Placed This Encounter  Procedures  . EKG 12-Lead    Disposition:   FU one year  Signed, Sherren Mocha, MD  03/06/2018 1:12 PM    [facility] [place], [place], [place]  [address] Phone: [phone]; Fax: [phone]

## 2018-03-06 NOTE — Patient Instructions (Signed)
Medication Instructions:  1. Your physician recommends that you continue on your current medications as directed. Please refer to the Current Medication list given to you today.   Labwork: NONE ORDERED TODAY  Testing/Procedures: NONE ORDERED TODAY  Follow-Up: Your physician wants you to follow-up in: [place] will receive a reminder letter in the mail two months in advance. If you don't receive a letter, please call our office to schedule the follow-up appointment.   Any Other Special Instructions Will Be Listed Below (If Applicable).     If you need a refill on your cardiac medications before your next appointment, please call your pharmacy.

## 2018-03-13 ENCOUNTER — Other Ambulatory Visit: Payer: Self-pay | Admitting: *Deleted

## 2018-03-13 DIAGNOSIS — R0602 Shortness of breath: Secondary | ICD-10-CM

## 2018-03-13 MED ORDER — FUROSEMIDE 40 MG PO TABS: 40.0000 mg | ORAL_TABLET | Freq: Every day | ORAL | 11 refills | Status: DC | PRN

## 2018-03-13 MED ORDER — NITROGLYCERIN 0.4 MG SL SUBL: 0.4000 mg | SUBLINGUAL_TABLET | SUBLINGUAL | 5 refills | Status: DC | PRN

## 2018-03-13 NOTE — Telephone Encounter (Signed)
Patient called to follow up on refill requests. She stated that she was seen in the office last week and requested refills be sent in for nitro and furosemide. She called the pharmacy and was informed that they did not receive anything. I do not see that any refills were sent in for her. Patient aware and appreciative that I will send these in as requested.

## 2018-03-20 ENCOUNTER — Other Ambulatory Visit: Payer: Self-pay | Admitting: Cardiovascular Disease

## 2018-05-03 ENCOUNTER — Other Ambulatory Visit: Payer: Self-pay | Admitting: Gastroenterology

## 2018-05-04 ENCOUNTER — Other Ambulatory Visit: Payer: Self-pay | Admitting: Gastroenterology

## 2018-05-27 DIAGNOSIS — E1151 Type 2 diabetes mellitus with diabetic peripheral angiopathy without gangrene: Secondary | ICD-10-CM | POA: Diagnosis not present

## 2018-05-27 DIAGNOSIS — L600 Ingrowing nail: Secondary | ICD-10-CM | POA: Diagnosis not present

## 2018-05-27 DIAGNOSIS — M79671 Pain in right foot: Secondary | ICD-10-CM | POA: Diagnosis not present

## 2018-05-27 DIAGNOSIS — E1140 Type 2 diabetes mellitus with diabetic neuropathy, unspecified: Secondary | ICD-10-CM | POA: Diagnosis not present

## 2018-05-28 DIAGNOSIS — M549 Dorsalgia, unspecified: Secondary | ICD-10-CM | POA: Diagnosis not present

## 2018-05-28 DIAGNOSIS — Z6829 Body mass index (BMI) 29.0-29.9, adult: Secondary | ICD-10-CM | POA: Diagnosis not present

## 2018-05-28 DIAGNOSIS — I10 Essential (primary) hypertension: Secondary | ICD-10-CM | POA: Diagnosis not present

## 2018-05-28 DIAGNOSIS — N181 Chronic kidney disease, stage 1: Secondary | ICD-10-CM | POA: Diagnosis not present

## 2018-05-28 DIAGNOSIS — E1122 Type 2 diabetes mellitus with diabetic chronic kidney disease: Secondary | ICD-10-CM | POA: Diagnosis not present

## 2018-07-07 DIAGNOSIS — M797 Fibromyalgia: Secondary | ICD-10-CM | POA: Diagnosis not present

## 2018-07-07 DIAGNOSIS — M810 Age-related osteoporosis without current pathological fracture: Secondary | ICD-10-CM | POA: Diagnosis not present

## 2018-07-07 DIAGNOSIS — Z23 Encounter for immunization: Secondary | ICD-10-CM | POA: Diagnosis not present

## 2018-07-07 DIAGNOSIS — Z853 Personal history of malignant neoplasm of breast: Secondary | ICD-10-CM | POA: Diagnosis not present

## 2018-07-07 DIAGNOSIS — M549 Dorsalgia, unspecified: Secondary | ICD-10-CM | POA: Diagnosis not present

## 2018-07-07 DIAGNOSIS — R05 Cough: Secondary | ICD-10-CM | POA: Diagnosis not present

## 2018-07-07 DIAGNOSIS — R600 Localized edema: Secondary | ICD-10-CM | POA: Diagnosis not present

## 2018-07-07 DIAGNOSIS — E119 Type 2 diabetes mellitus without complications: Secondary | ICD-10-CM | POA: Diagnosis not present

## 2018-07-07 DIAGNOSIS — R69 Illness, unspecified: Secondary | ICD-10-CM | POA: Diagnosis not present

## 2018-07-07 DIAGNOSIS — C50912 Malignant neoplasm of unspecified site of left female breast: Secondary | ICD-10-CM | POA: Diagnosis not present

## 2018-07-07 DIAGNOSIS — Z170 Estrogen receptor positive status [ER+]: Secondary | ICD-10-CM | POA: Diagnosis not present

## 2018-07-07 DIAGNOSIS — C50919 Malignant neoplasm of unspecified site of unspecified female breast: Secondary | ICD-10-CM | POA: Diagnosis not present

## 2018-07-31 ENCOUNTER — Other Ambulatory Visit: Payer: Self-pay | Admitting: Gastroenterology

## 2018-08-22 DIAGNOSIS — R4182 Altered mental status, unspecified: Secondary | ICD-10-CM | POA: Diagnosis not present

## 2018-08-22 DIAGNOSIS — M810 Age-related osteoporosis without current pathological fracture: Secondary | ICD-10-CM | POA: Diagnosis not present

## 2018-08-22 DIAGNOSIS — W1839XA Other fall on same level, initial encounter: Secondary | ICD-10-CM | POA: Diagnosis not present

## 2018-08-22 DIAGNOSIS — K449 Diaphragmatic hernia without obstruction or gangrene: Secondary | ICD-10-CM | POA: Diagnosis not present

## 2018-08-22 DIAGNOSIS — G909 Disorder of the autonomic nervous system, unspecified: Secondary | ICD-10-CM | POA: Diagnosis not present

## 2018-08-22 DIAGNOSIS — I10 Essential (primary) hypertension: Secondary | ICD-10-CM | POA: Diagnosis not present

## 2018-08-22 DIAGNOSIS — M545 Low back pain: Secondary | ICD-10-CM | POA: Diagnosis not present

## 2018-08-22 DIAGNOSIS — G8929 Other chronic pain: Secondary | ICD-10-CM | POA: Diagnosis not present

## 2018-08-22 DIAGNOSIS — E785 Hyperlipidemia, unspecified: Secondary | ICD-10-CM | POA: Diagnosis not present

## 2018-08-22 DIAGNOSIS — E039 Hypothyroidism, unspecified: Secondary | ICD-10-CM | POA: Diagnosis not present

## 2018-08-22 DIAGNOSIS — R55 Syncope and collapse: Secondary | ICD-10-CM | POA: Diagnosis not present

## 2018-08-22 DIAGNOSIS — S0990XA Unspecified injury of head, initial encounter: Secondary | ICD-10-CM | POA: Diagnosis not present

## 2018-08-22 DIAGNOSIS — E119 Type 2 diabetes mellitus without complications: Secondary | ICD-10-CM | POA: Diagnosis not present

## 2018-08-22 DIAGNOSIS — W1830XA Fall on same level, unspecified, initial encounter: Secondary | ICD-10-CM | POA: Diagnosis not present

## 2018-08-23 DIAGNOSIS — I10 Essential (primary) hypertension: Secondary | ICD-10-CM | POA: Diagnosis not present

## 2018-08-23 DIAGNOSIS — G909 Disorder of the autonomic nervous system, unspecified: Secondary | ICD-10-CM | POA: Diagnosis not present

## 2018-08-23 DIAGNOSIS — E785 Hyperlipidemia, unspecified: Secondary | ICD-10-CM | POA: Diagnosis not present

## 2018-08-23 DIAGNOSIS — E039 Hypothyroidism, unspecified: Secondary | ICD-10-CM | POA: Diagnosis not present

## 2018-08-23 DIAGNOSIS — E119 Type 2 diabetes mellitus without complications: Secondary | ICD-10-CM | POA: Diagnosis not present

## 2018-09-02 DIAGNOSIS — R55 Syncope and collapse: Secondary | ICD-10-CM | POA: Diagnosis not present

## 2018-09-02 DIAGNOSIS — Z6828 Body mass index (BMI) 28.0-28.9, adult: Secondary | ICD-10-CM | POA: Diagnosis not present

## 2018-09-03 IMAGING — XA Imaging study
2 series · 2 of 2 positions shown · non-contrast
Comparison: none

CLINICAL DATA: Lumbosacral spondylosis without myelopathy. Low back
and left lower extremity pain. Good response to the prior injection.

[Series 1: ortho standard · 1 of 1 slices shown (1 of 2)]
[im 1/1]
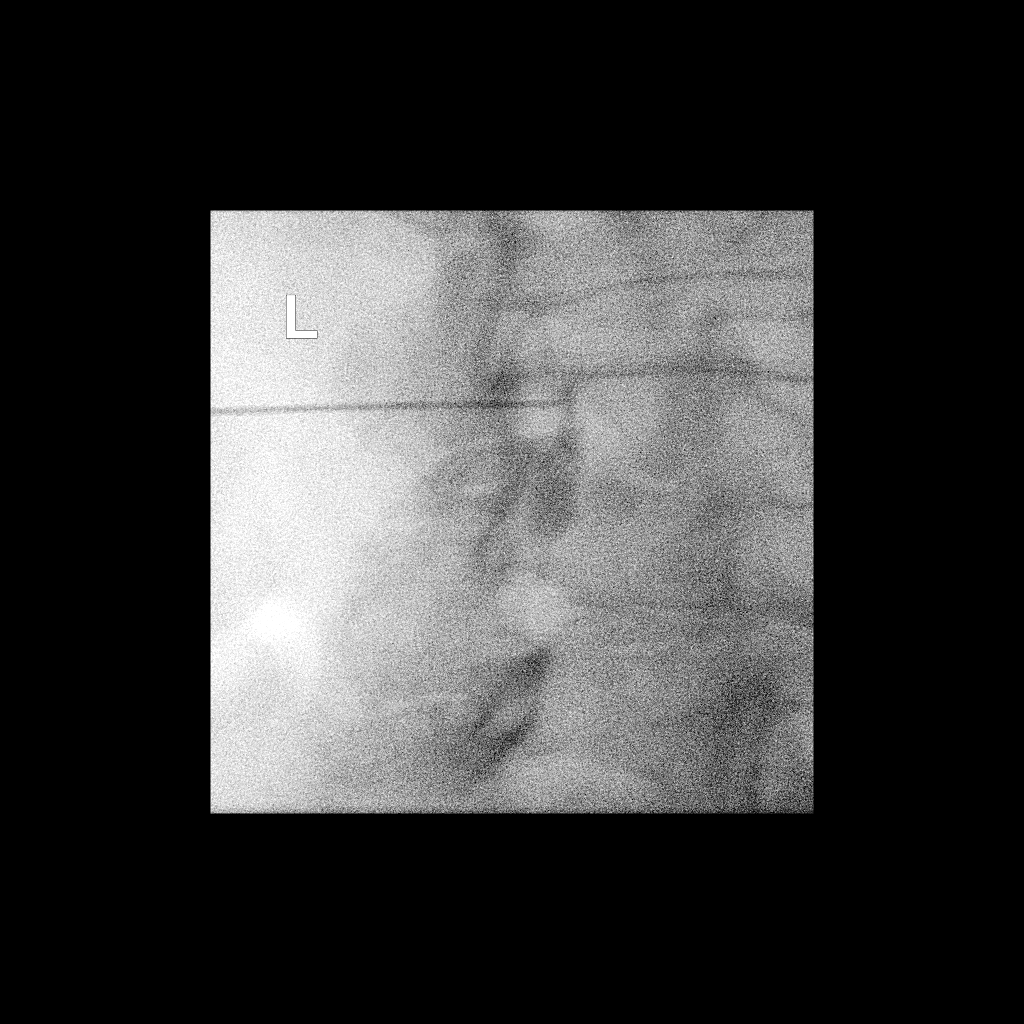

[Series 2: ortho standard · 1 of 1 slices shown (2 of 2)]
[im 1/1]
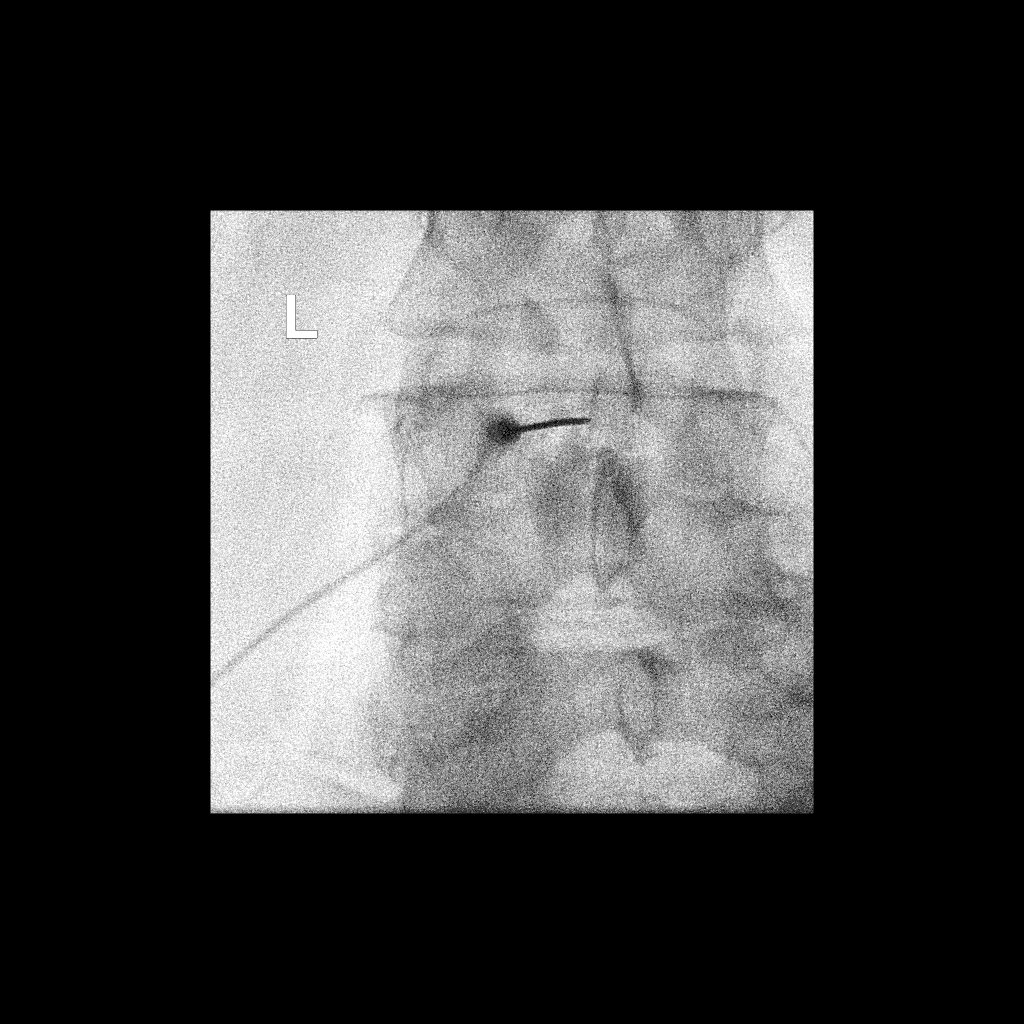

[2 of 2 positions shown; findings below may reference images not displayed]

FLUOROSCOPY TIME:  Radiation Exposure Index (as provided by the
fluoroscopic device): 16.53 microGray*m^2

Fluoroscopy Time (in minutes and seconds):  9 seconds

PROCEDURE:
The procedure, risks, benefits, and alternatives were explained to
the patient. Questions regarding the procedure were encouraged and
answered. The patient understands and consents to the procedure.

LUMBAR EPIDURAL INJECTION:

An interlaminar approach was performed on the left at L3-4. The
overlying skin was cleansed and anesthetized. A 3.5 inch 20 gauge
epidural needle was advanced using loss-of-resistance technique.

DIAGNOSTIC EPIDURAL INJECTION:

Injection of Isovue-M 200 shows a good epidural pattern with spread
above and below the level of needle placement, primarily on the
left. No vascular opacification is seen.

THERAPEUTIC EPIDURAL INJECTION:

120 mg of Depo-Medrol mixed with 3 mL of 1% lidocaine were
instilled. The procedure was well-tolerated, and the patient was
discharged thirty minutes following the injection in good condition.

COMPLICATIONS:
None
IMPRESSION: Technically successful lumbar interlaminar epidural injection on the
left at L3-4.

## 2018-10-07 DIAGNOSIS — M542 Cervicalgia: Secondary | ICD-10-CM | POA: Diagnosis not present

## 2018-10-07 DIAGNOSIS — M545 Low back pain: Secondary | ICD-10-CM | POA: Diagnosis not present

## 2018-10-26 ENCOUNTER — Other Ambulatory Visit: Payer: Self-pay | Admitting: Gastroenterology

## 2018-11-03 DIAGNOSIS — R69 Illness, unspecified: Secondary | ICD-10-CM | POA: Diagnosis not present

## 2018-11-29 DIAGNOSIS — G4730 Sleep apnea, unspecified: Secondary | ICD-10-CM | POA: Diagnosis not present

## 2018-12-11 DIAGNOSIS — R55 Syncope and collapse: Secondary | ICD-10-CM | POA: Diagnosis not present

## 2018-12-11 DIAGNOSIS — Z6828 Body mass index (BMI) 28.0-28.9, adult: Secondary | ICD-10-CM | POA: Diagnosis not present

## 2018-12-11 DIAGNOSIS — R0602 Shortness of breath: Secondary | ICD-10-CM | POA: Diagnosis not present

## 2019-01-01 DIAGNOSIS — I10 Essential (primary) hypertension: Secondary | ICD-10-CM | POA: Diagnosis not present

## 2019-01-01 DIAGNOSIS — E039 Hypothyroidism, unspecified: Secondary | ICD-10-CM | POA: Diagnosis not present

## 2019-01-01 DIAGNOSIS — Z6828 Body mass index (BMI) 28.0-28.9, adult: Secondary | ICD-10-CM | POA: Diagnosis not present

## 2019-01-01 DIAGNOSIS — G4730 Sleep apnea, unspecified: Secondary | ICD-10-CM | POA: Diagnosis not present

## 2019-01-01 DIAGNOSIS — E785 Hyperlipidemia, unspecified: Secondary | ICD-10-CM | POA: Diagnosis not present

## 2019-01-02 DIAGNOSIS — E785 Hyperlipidemia, unspecified: Secondary | ICD-10-CM | POA: Diagnosis not present

## 2019-01-02 DIAGNOSIS — Z6828 Body mass index (BMI) 28.0-28.9, adult: Secondary | ICD-10-CM | POA: Diagnosis not present

## 2019-01-02 DIAGNOSIS — E039 Hypothyroidism, unspecified: Secondary | ICD-10-CM | POA: Diagnosis not present

## 2019-01-02 DIAGNOSIS — G4730 Sleep apnea, unspecified: Secondary | ICD-10-CM | POA: Diagnosis not present

## 2019-01-02 DIAGNOSIS — I10 Essential (primary) hypertension: Secondary | ICD-10-CM | POA: Diagnosis not present

## 2019-01-05 DIAGNOSIS — G4733 Obstructive sleep apnea (adult) (pediatric): Secondary | ICD-10-CM | POA: Diagnosis not present

## 2019-01-12 DIAGNOSIS — G4733 Obstructive sleep apnea (adult) (pediatric): Secondary | ICD-10-CM | POA: Diagnosis not present

## 2019-01-28 ENCOUNTER — Other Ambulatory Visit: Payer: Self-pay | Admitting: Gastroenterology

## 2019-02-02 DIAGNOSIS — R69 Illness, unspecified: Secondary | ICD-10-CM | POA: Diagnosis not present

## 2019-02-05 DIAGNOSIS — G4733 Obstructive sleep apnea (adult) (pediatric): Secondary | ICD-10-CM | POA: Diagnosis not present

## 2019-02-27 ENCOUNTER — Other Ambulatory Visit: Payer: Self-pay | Admitting: Gastroenterology

## 2019-03-05 ENCOUNTER — Telehealth: Payer: Self-pay

## 2019-03-05 NOTE — Telephone Encounter (Signed)
Attempted to reach pt on both numbers. Left message for pt to call back about upcoming appt and whether she would like video or phone visit.

## 2019-03-07 DIAGNOSIS — G4733 Obstructive sleep apnea (adult) (pediatric): Secondary | ICD-10-CM | POA: Diagnosis not present

## 2019-03-10 DIAGNOSIS — Z9641 Presence of insulin pump (external) (internal): Secondary | ICD-10-CM | POA: Diagnosis not present

## 2019-03-10 DIAGNOSIS — E119 Type 2 diabetes mellitus without complications: Secondary | ICD-10-CM | POA: Diagnosis not present

## 2019-03-10 DIAGNOSIS — E1169 Type 2 diabetes mellitus with other specified complication: Secondary | ICD-10-CM | POA: Diagnosis not present

## 2019-03-10 DIAGNOSIS — E118 Type 2 diabetes mellitus with unspecified complications: Secondary | ICD-10-CM | POA: Diagnosis not present

## 2019-03-10 DIAGNOSIS — Z794 Long term (current) use of insulin: Secondary | ICD-10-CM | POA: Diagnosis not present

## 2019-03-10 DIAGNOSIS — E039 Hypothyroidism, unspecified: Secondary | ICD-10-CM | POA: Diagnosis not present

## 2019-03-11 ENCOUNTER — Telehealth: Payer: Medicare HMO | Admitting: Cardiovascular Disease

## 2019-03-11 ENCOUNTER — Other Ambulatory Visit: Payer: Self-pay

## 2019-03-21 ENCOUNTER — Other Ambulatory Visit: Payer: Self-pay | Admitting: Cardiovascular Disease

## 2019-03-30 ENCOUNTER — Other Ambulatory Visit: Payer: Self-pay | Admitting: Cardiovascular Disease

## 2019-03-30 DIAGNOSIS — R0602 Shortness of breath: Secondary | ICD-10-CM

## 2019-03-31 DIAGNOSIS — E039 Hypothyroidism, unspecified: Secondary | ICD-10-CM | POA: Diagnosis not present

## 2019-03-31 DIAGNOSIS — G4730 Sleep apnea, unspecified: Secondary | ICD-10-CM | POA: Diagnosis not present

## 2019-03-31 DIAGNOSIS — M545 Low back pain: Secondary | ICD-10-CM | POA: Diagnosis not present

## 2019-03-31 DIAGNOSIS — E1121 Type 2 diabetes mellitus with diabetic nephropathy: Secondary | ICD-10-CM | POA: Diagnosis not present

## 2019-03-31 DIAGNOSIS — Z6828 Body mass index (BMI) 28.0-28.9, adult: Secondary | ICD-10-CM | POA: Diagnosis not present

## 2019-03-31 DIAGNOSIS — E785 Hyperlipidemia, unspecified: Secondary | ICD-10-CM | POA: Diagnosis not present

## 2019-03-31 DIAGNOSIS — I10 Essential (primary) hypertension: Secondary | ICD-10-CM | POA: Diagnosis not present

## 2019-04-07 DIAGNOSIS — G4733 Obstructive sleep apnea (adult) (pediatric): Secondary | ICD-10-CM | POA: Diagnosis not present

## 2019-04-09 DIAGNOSIS — G4733 Obstructive sleep apnea (adult) (pediatric): Secondary | ICD-10-CM | POA: Diagnosis not present

## 2019-04-15 ENCOUNTER — Other Ambulatory Visit: Payer: Self-pay

## 2019-04-15 ENCOUNTER — Other Ambulatory Visit: Payer: Medicare HMO

## 2019-04-15 DIAGNOSIS — Z20822 Contact with and (suspected) exposure to covid-19: Secondary | ICD-10-CM

## 2019-04-15 DIAGNOSIS — R6889 Other general symptoms and signs: Secondary | ICD-10-CM | POA: Diagnosis not present

## 2019-04-20 LAB — NOVEL CORONAVIRUS, NAA: SARS-CoV-2, NAA: NOT DETECTED

## 2019-04-27 ENCOUNTER — Other Ambulatory Visit: Payer: Self-pay | Admitting: Gastroenterology

## 2019-05-07 DIAGNOSIS — G4733 Obstructive sleep apnea (adult) (pediatric): Secondary | ICD-10-CM | POA: Diagnosis not present

## 2019-05-29 ENCOUNTER — Other Ambulatory Visit: Payer: Self-pay | Admitting: Gastroenterology

## 2019-06-04 DIAGNOSIS — L821 Other seborrheic keratosis: Secondary | ICD-10-CM | POA: Diagnosis not present

## 2019-06-04 DIAGNOSIS — D1801 Hemangioma of skin and subcutaneous tissue: Secondary | ICD-10-CM | POA: Diagnosis not present

## 2019-06-04 DIAGNOSIS — L309 Dermatitis, unspecified: Secondary | ICD-10-CM | POA: Diagnosis not present

## 2019-06-04 DIAGNOSIS — D225 Melanocytic nevi of trunk: Secondary | ICD-10-CM | POA: Diagnosis not present

## 2019-06-04 DIAGNOSIS — L738 Other specified follicular disorders: Secondary | ICD-10-CM | POA: Diagnosis not present

## 2019-06-07 DIAGNOSIS — G4733 Obstructive sleep apnea (adult) (pediatric): Secondary | ICD-10-CM | POA: Diagnosis not present

## 2019-06-12 ENCOUNTER — Telehealth: Payer: Self-pay | Admitting: Gastroenterology

## 2019-06-13 DIAGNOSIS — R69 Illness, unspecified: Secondary | ICD-10-CM | POA: Diagnosis not present

## 2019-06-14 ENCOUNTER — Other Ambulatory Visit: Payer: Self-pay | Admitting: Cardiovascular Disease

## 2019-06-25 ENCOUNTER — Ambulatory Visit: Payer: Medicare HMO | Admitting: Cardiovascular Disease

## 2019-07-01 DIAGNOSIS — E1121 Type 2 diabetes mellitus with diabetic nephropathy: Secondary | ICD-10-CM | POA: Diagnosis not present

## 2019-07-01 DIAGNOSIS — E782 Mixed hyperlipidemia: Secondary | ICD-10-CM | POA: Diagnosis not present

## 2019-07-01 DIAGNOSIS — G4733 Obstructive sleep apnea (adult) (pediatric): Secondary | ICD-10-CM | POA: Diagnosis not present

## 2019-07-01 DIAGNOSIS — E038 Other specified hypothyroidism: Secondary | ICD-10-CM | POA: Diagnosis not present

## 2019-07-01 DIAGNOSIS — M545 Low back pain: Secondary | ICD-10-CM | POA: Diagnosis not present

## 2019-07-01 DIAGNOSIS — I10 Essential (primary) hypertension: Secondary | ICD-10-CM | POA: Diagnosis not present

## 2019-07-01 DIAGNOSIS — Z6828 Body mass index (BMI) 28.0-28.9, adult: Secondary | ICD-10-CM | POA: Diagnosis not present

## 2019-07-08 DIAGNOSIS — G4733 Obstructive sleep apnea (adult) (pediatric): Secondary | ICD-10-CM | POA: Diagnosis not present

## 2019-07-10 ENCOUNTER — Other Ambulatory Visit: Payer: Self-pay | Admitting: Cardiovascular Disease

## 2019-07-10 ENCOUNTER — Encounter: Payer: Self-pay | Admitting: Gastroenterology

## 2019-07-10 ENCOUNTER — Ambulatory Visit: Payer: Medicare HMO | Admitting: Gastroenterology

## 2019-07-10 VITALS — BP 110/60 | HR 64 | Temp 97.9°F | Ht 67.0 in | Wt 181.6 lb

## 2019-07-10 DIAGNOSIS — G4733 Obstructive sleep apnea (adult) (pediatric): Secondary | ICD-10-CM | POA: Diagnosis not present

## 2019-07-10 DIAGNOSIS — K5730 Diverticulosis of large intestine without perforation or abscess without bleeding: Secondary | ICD-10-CM

## 2019-07-10 DIAGNOSIS — K5909 Other constipation: Secondary | ICD-10-CM | POA: Diagnosis not present

## 2019-07-10 DIAGNOSIS — R140 Abdominal distension (gaseous): Secondary | ICD-10-CM | POA: Diagnosis not present

## 2019-07-10 NOTE — Patient Instructions (Signed)
Take either Linzess 290 mcg or Trulance 3 mg daily, which ever is cheaper with your insurance is ok to take  Take Fiberchoice 1 tablet twice daily  If you are age 76 or older, your body mass index should be between 23-30. Your Body mass index is 28.44 kg/m. If this is out of the aforementioned range listed, please consider follow up with your Primary Care Provider.  If you are age 24 or younger, your body mass index should be between 19-25. Your Body mass index is 28.44 kg/m. If this is out of the aformentioned range listed, please consider follow up with your Primary Care Provider.    Follow up in 6 months  I appreciate the  opportunity to care for you  Thank You   Harl Bowie , MD

## 2019-07-10 NOTE — Progress Notes (Signed)
Jeanne Kim    [id number]    1941-09-10  Primary Care Physician:Hasanaj, Samul Dada, MD  Referring Physician: Neale Burly, MD [address] [place],   [id number]   Chief complaint:  Nausea, left abdominal pain  HPI: 78 year old female here for follow-up visit for chronic constipation.  Colonoscopy 02/13/2017 Sigmoid diverticulosis, internal hemorrhoids  Sometimes no BM for a week, she is taking Linzess 153mg daily. Insurance copay is about $100 with Linzess. She has excessive bloating, gas and abdominal cramp Complain of back pain with sciatica, is planning to follow-up with pain management clinic.  Denies any nausea, vomiting, abdominal pain, melena or bright red blood per rectum    Previous HPI April [birth year] [id number] old female status post hysterectomy, repair of rectocele, cystocele and enterocele, adhesiolysis, irritable bowel syndrome, chronic constipation here for follow-up visit. She was last seen in August 2017. She is taking Linzess 290 micrograms twice weekly with loose bowel movement. 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 She tried lower dose Linzess in the past with no significant improvement of constipation. She continues to have abdominal fullness, bloating and intermittent nausea. Patient has family history of colon cancer and also personal history of adenomatous polyps. Last colonoscopy in March 2013 by Dr. PSharlett Ilesshowed diverticulosis and internal hemorrhoids with no polyps She has personal history of breast cancer and family history of gastric, lung, ovarian and colon cancer Per patient she was tested for BRCA genes and was negative. She is not sure if she was tested for any other genetic cancer predisposition   Outpatient Encounter Medications as of 07/10/2019  Medication Sig  . aspirin 81 MG chewable tablet Chew 81 mg by mouth daily.  .Marland Kitchenatorvastatin  (LIPITOR) 20 MG tablet Take 1 tablet (20 mg total) by mouth daily.  . calcium-vitamin D (OSCAL WITH D) 500-200 MG-UNIT per tablet Take 1 tablet by mouth 2 (two) times daily.  . Coenzyme Q10 (CO Q-10) 100 MG CAPS Take 100 mg by mouth daily.  . cyanocobalamin (,VITAMIN B-12,) 1000 MCG/ML injection INJECT 1 ML INTO MUSCLE ONCE A MONTH  . DULoxetine (CYMBALTA) 60 MG capsule Take 60 mg by mouth daily.  .Marland KitchenEPINEPHrine 0.3 mg/0.3 mL IJ SOAJ injection Frequency:PHARMDIR   Dosage:0.0     Instructions:  Note:self inject in the event of allergic reaction, then proceed immediately to the nearest Emergency Room. Dose: 1 INJECTION  . fentaNYL (DURAGESIC - DOSED MCG/HR) 25 MCG/HR patch Place 25 mcg onto the skin every 3 (three) days.  . fexofenadine (ALLEGRA) 30 MG tablet Take 30 mg by mouth daily.  . fluticasone (FLONASE) 50 MCG/ACT nasal spray Place 2 sprays into the nose daily.  . furosemide (LASIX) 40 MG tablet Take 1 tablet (40 mg total) by mouth daily. Please schedule an office visit for further refills  . gabapentin (NEURONTIN) 300 MG capsule Take 300 mg by mouth at bedtime.  .Marland KitchenglipiZIDE (GLUCOTROL) 10 MG tablet Take 10 mg by mouth daily before breakfast.  . Insulin Glargine-Lixisenatide (SOLIQUA) 100-33 UNT-MCG/ML SOPN Inject 20 Units into the skin.  . Insulin Lispro Prot & Lispro (HUMALOG MIX 75/25 KWIKPEN) (75-25) 100 UNIT/ML Kwikpen Inject 4-5 Units into the skin 3 (three) times daily.   .Marland KitchenLEVEMIR FLEXTOUCH 100 UNIT/ML Pen 20 Units daily at 10 pm.   . levothyroxine (SYNTHROID, LEVOTHROID) 125 MCG tablet Take 1 tablet by mouth daily.  .Marland Kitchenlidocaine (LIDODERM) 5 %  Place 1 patch onto the skin daily. Remove & Discard patch within 12 hours or as directed by MD  . linaclotide (LINZESS) 290 MCG CAPS capsule Take 1 capsule (290 mcg total) by mouth daily.  Marland Kitchen LINZESS 145 MCG CAPS capsule TAKE 1 CAPSULE (145 MCG TOTAL) BY MOUTH DAILY BEFORE BREAKFAST.  . Melatonin 10 MG TABS Take by mouth.  . metFORMIN  (GLUCOPHAGE) 1000 MG tablet Take 1,000 mg by mouth 2 (two) times daily with a meal.  . methocarbamol (ROBAXIN) 750 MG tablet Take 750 mg by mouth every 8 (eight) hours.  . metoprolol succinate (TOPROL-XL) 50 MG 24 hr tablet Take 1 tablet (50 mg total) by mouth daily.  . midodrine (PROAMATINE) 2.5 MG tablet Take 2.5 mg by mouth 3 (three) times daily.  . montelukast (SINGULAIR) 10 MG tablet Take 10 mg by mouth at bedtime.  . naproxen sodium (ALEVE) 220 MG tablet Take 220 mg by mouth.  . Needles & Syringes MISC 25G 5/8 inch needle on 3 ml syringe-Use once monthly for B12 injections  . nitroGLYCERIN (NITROSTAT) 0.4 MG SL tablet Place 1 tablet (0.4 mg total) under the tongue every 5 (five) minutes as needed for chest pain.  . Omega-3 Fatty Acids (FISH OIL PO) Take by mouth.  . ondansetron (ZOFRAN) 4 MG tablet Take 1 tablet (4 mg total) by mouth every 8 (eight) hours as needed for nausea or vomiting.  Glory Rosebush VERIO test strip 1 each by Other route as directed.   . pantoprazole (PROTONIX) 40 MG tablet TAKE 1 TABLET (40 MG TOTAL) BY MOUTH 2 (TWO) TIMES DAILY.  Marland Kitchen potassium chloride (K-DUR,KLOR-CON) 10 MEQ tablet Take 10 mEq by mouth once. Take one tablet every time you take lasix  . ramipril (ALTACE) 5 MG capsule Take 1 capsule (5 mg total) by mouth at bedtime.  Marland Kitchen VAGIFEM 10 MCG TABS vaginal tablet Place 1 tablet vaginally once a week.   . [DISCONTINUED] BELSOMRA 10 MG TABS Take 1 tablet by mouth daily.   . [DISCONTINUED] Liraglutide (VICTOZA) 18 MG/3ML SOPN INJECT 0.2 MLS (1.2 MG TOTAL) INTO THE SKIN DAILY.   Facility-Administered Encounter Medications as of 07/10/2019  Medication  . 0.9 %  sodium chloride infusion  . 0.9 %  sodium chloride infusion    Allergies as of 07/10/2019 - Review Complete 07/10/2019  Allergen Reaction Noted  . Codeine Hives 04/27/2009  . Sulfonamide derivatives Hives 04/27/2009    Past Medical History:  Diagnosis Date  . Allergy   . Anemia   . Anginal pain ([place])    . Anxiety   . Arthritis   . Blood transfusion without reported diagnosis   . Breast cancer ([place])   . Carotid artery plaque    bilat  . Cataract   . Colon polyps    adenomatous  . Concussion 02/2016   Driving restrictions for 6w  . Coronary heart disease   . Depression   . Diverticulosis   . Dizziness   . Esophageal reflux   . Esophageal stricture   . Fibromyalgia   . Gallstones   . Hiatal hernia   . Hypercholesterolemia   . Hypertension   . Hypothyroidism   . IBS (irritable bowel syndrome)   . Kidney stones   . Osteoporosis   . Pneumonia    recently, just finished ABO  . Pneumonia   . PONV (postoperative nausea and vomiting)   . Sleep apnea    CPAP  . Thyroid disease 2002  . Type II or  unspecified type diabetes mellitus without mention of complication, not stated as uncontrolled   . Unspecified gastritis and gastroduodenitis without mention of hemorrhage     Past Surgical History:  Procedure Laterality Date  . ANAL RECTAL MANOMETRY N/A 08/05/2015   Procedure: ANO RECTAL MANOMETRY;  Surgeon: Mauri Pole, MD;  Location: [facility];  Service: Endoscopy;  Laterality: N/A;  . APPENDECTOMY    . BREAST LUMPECTOMY     left  . CARDIAC CATHETERIZATION    . CATARACT EXTRACTION W/PHACO Left 03/30/2013   Procedure: CATARACT EXTRACTION PHACO AND INTRAOCULAR LENS PLACEMENT (IOC);  Surgeon: Tonny Branch, MD;  Location: [facility];  Service: Ophthalmology;  Laterality: Left;  CDE:19.28  . CATARACT EXTRACTION W/PHACO Right 04/09/2013   Procedure: CATARACT EXTRACTION PHACO AND INTRAOCULAR LENS PLACEMENT (IOC);  Surgeon: Tonny Branch, MD;  Location: [facility];  Service: Ophthalmology;  Laterality: Right;  CDE: 15.44  . CERVICAL FUSION    . CESAREAN SECTION    . CHOLECYSTECTOMY    . CYSTOCELE REPAIR    . DILATION AND CURETTAGE OF UTERUS    . EYE SURGERY Left 03-30-13   Cataract  . EYE SURGERY Right 04-09-13   Cataract  . RECTOCELE REPAIR    . THYROIDECTOMY    . TONSILLECTOMY    .  TOTAL ABDOMINAL HYSTERECTOMY      Family History  Problem Relation Age of Onset  . Sarcoidosis Mother   . Diabetes Mother   . Cancer Mother        sarcoma -  Right arm amputation  . Hypertension Mother   . Lung cancer Father   . Diabetes Father   . Heart disease Father        Before age 21  . Urolithiasis Father   . Cancer Father        Lung  . Hyperlipidemia Father   . Hypertension Father   . Esophageal cancer Maternal Uncle   . Ovarian cancer Maternal Aunt   . Colon cancer Maternal Uncle   . Lung cancer Maternal Uncle   . Diabetes Maternal Uncle   . Diabetes Maternal Aunt   . Colon polyps Maternal Uncle   . Heart disease Paternal Grandmother     Social History   Socioeconomic History  . Marital status: Married    Spouse name: Not on file  . Number of children: 4  . Years of education: Not on file  . Highest education level: Not on file  Occupational History  . Occupation: Retired Optician, dispensing: RETIRED  Social Needs  . Financial resource strain: Not on file  . Food insecurity    Worry: Not on file    Inability: Not on file  . Transportation needs    Medical: Not on file    Non-medical: Not on file  Tobacco Use  . Smoking status: Never Smoker  . Smokeless tobacco: Never Used  Substance and Sexual Activity  . Alcohol use: No  . Drug use: No  . Sexual activity: Not on file  Lifestyle  . Physical activity    Days per week: Not on file    Minutes per session: Not on file  . Stress: Not on file  Relationships  . Social Herbalist on phone: Not on file    Gets together: Not on file    Attends religious service: Not on file    Active member of club or organization: Not on file    Attends meetings of clubs  or organizations: Not on file    Relationship status: Not on file  . Intimate partner violence    Fear of current or ex partner: Not on file    Emotionally abused: Not on file    Physically abused: Not on file    Forced sexual activity:  Not on file  Other Topics Concern  . Not on file  Social History Narrative  . Not on file      Review of systems: Review of Systems  Constitutional: Negative for fever and chills.  HENT: Positive for post nasal drip Eyes: Negative for blurred vision.  Respiratory: Negative for cough, shortness of breath and wheezing.   Cardiovascular: Negative for chest pain and palpitations.  Gastrointestinal: as per HPI Genitourinary: Negative for dysuria, urgency, frequency and hematuria.  Musculoskeletal: Negative for myalgias, back pain and joint pain.  Skin: Negative for itching and rash.  Neurological: Negative for dizziness, tremors, focal weakness, seizures and loss of consciousness. Positive for problem with balance. Endo/Heme/Allergies: Positive for seasonal allergies.  Psychiatric/Behavioral: Negative for depression, suicidal ideas and hallucinations. Positive for insomnia. All other systems reviewed and are negative.   Physical Exam: Vitals:   07/10/19 1345  BP: 110/60  Pulse: 64  Temp: 97.9 F (36.6 C)   Body mass index is 28.44 kg/m. Gen:      No acute distress HEENT:  EOMI, sclera anicteric Neck:     No masses; no thyromegaly Lungs:    Clear to auscultation bilaterally; normal respiratory effort CV:         Regular rate and rhythm; no murmurs Abd:      + bowel sounds; soft, non-tender; no palpable masses, no distension Ext:    No edema; adequate peripheral perfusion Skin:      Warm and dry; no rash Neuro: alert and oriented x 3 Psych: normal mood and affect  Data Reviewed:  Reviewed labs, radiology imaging, old records and pertinent past GI work up   Assessment and Plan/Recommendations: 78 year old female status post hysterectomy, repair of rectocele, cystocele and enterocele, chronic irritable bowel syndrome and chronic idiopathic constipation  Anorectal manometry with findings suggestive of dyssynergy defecation status post pelvic floor physical therapy  We  will increase Linzess to 290 mcg daily or consider starting Trulance 3 mg daily whichever has better insurance coverage with lesser out-of-pocket expense  Start Fiberchoice 1 tablet twice daily  Return in 6 months or sooner if needed 25 minutes was spent face-to-face with the patient. Greater than 50% of the time used for counseling as well as treatment plan and follow-up. She had multiple questions which were answered to her satisfaction  K. Denzil Magnuson , MD    CC: Neale Burly, MD

## 2019-07-16 ENCOUNTER — Encounter: Payer: Self-pay | Admitting: Gastroenterology

## 2019-07-24 ENCOUNTER — Telehealth: Payer: Self-pay | Admitting: Gastroenterology

## 2019-07-24 MED ORDER — LINACLOTIDE 290 MCG PO CAPS: 290.0000 ug | ORAL_CAPSULE | Freq: Every day | ORAL | 1 refills | Status: DC

## 2019-07-24 NOTE — Telephone Encounter (Signed)
Informed patient I will send in Linzess 290 mcg to her pharmacy and to call if this medication is too expensive and we can send in Trulance. Patient verbalized understanding.

## 2019-07-27 IMAGING — XA DG MYELOGRAPHY LUMBAR INJ MULTI REGION
11 of 24 series · 11 of 24 positions shown · non-contrast
Comparison: none

CLINICAL DATA: Low back and LEFT leg pain. Mid back and LEFT flank
pain. Neck and LEFT arm pain.
TECHNIQUE: Contiguous axial images were obtained through the Cervical,
Thoracic, and Lumbar spine after the intrathecal infusion of
infusion. Coronal and sagittal reconstructions were obtained of the
axial image sets.

[Series 1: vasc standard · 1 of 1 slices shown (1 of 9)]
[im 1/1]
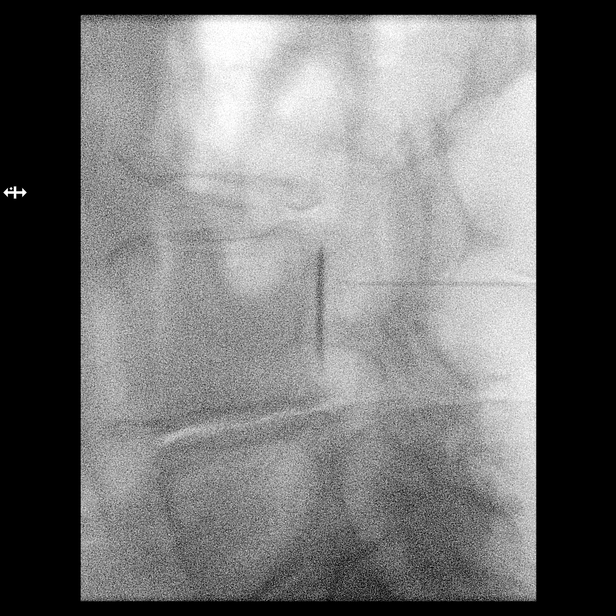

[Series 2: vasc standard · 1 of 1 slices shown (2 of 9)]
[im 1/1]
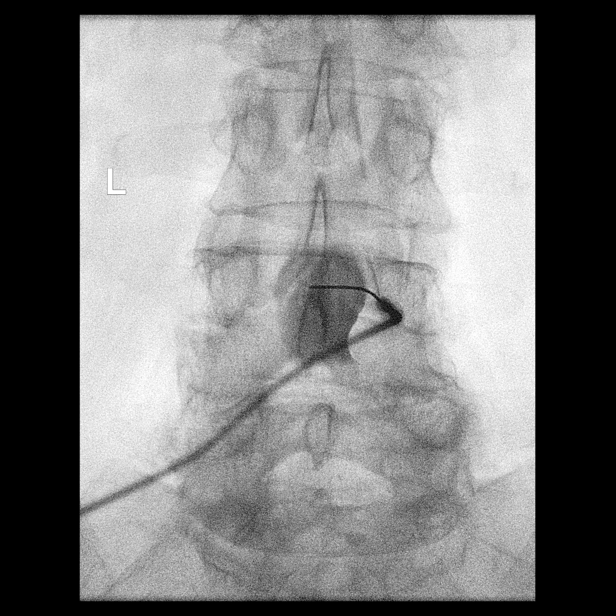

[Series 3: vasc standard · 1 of 1 slices shown (3 of 9)]
[im 1/1]
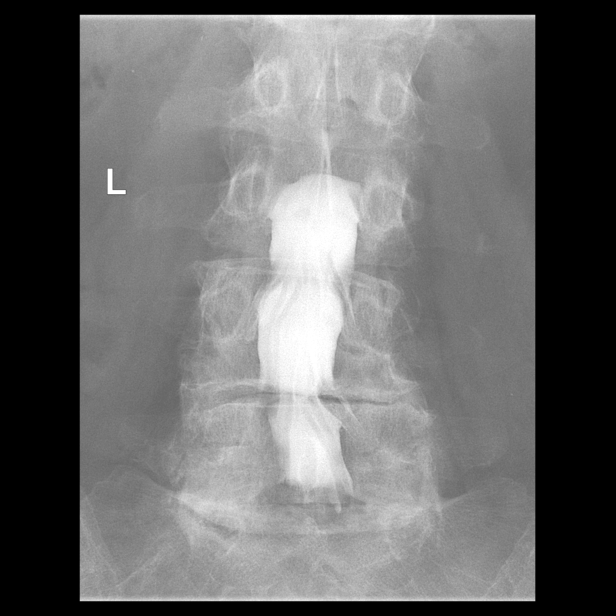

[Series 4: w cervical swimmers · 0.15mm/px · 1 of 1 slices shown]
[im 1/1]
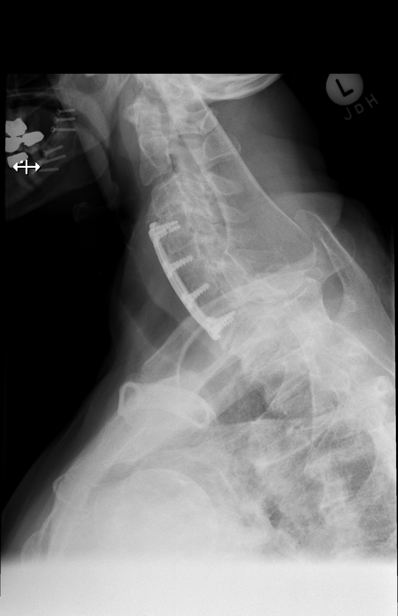

[Series 5: vasc standard · 1 of 1 slices shown (4 of 9)]
[im 1/1]
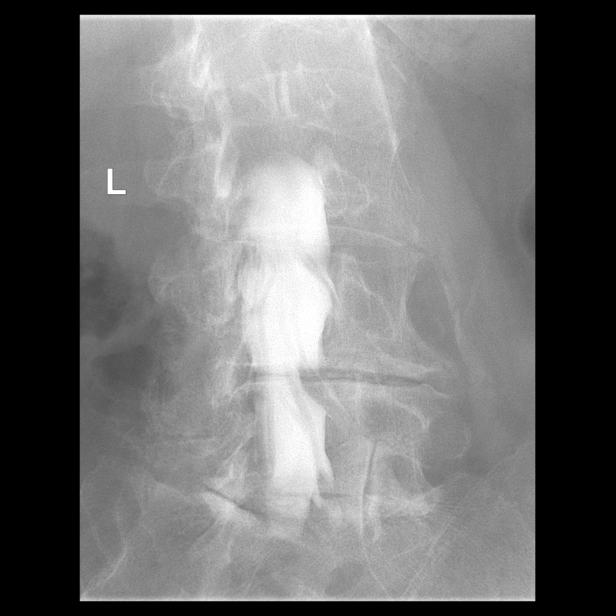

[Series 7: vasc standard · 1 of 1 slices shown (5 of 9)]
[im 1/1]
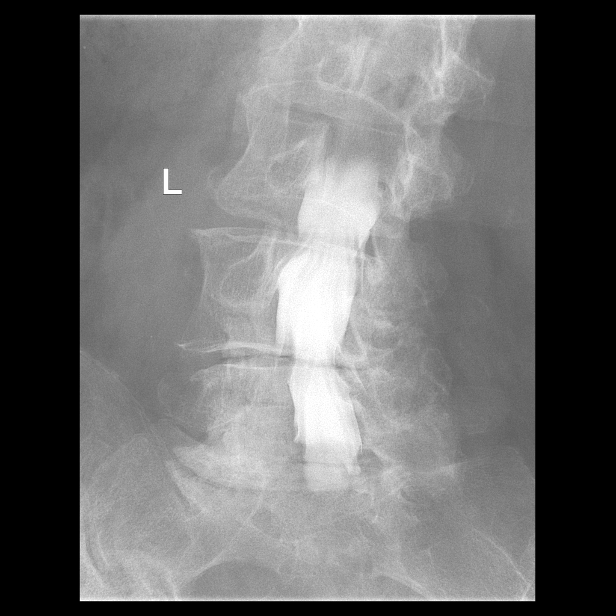

[Series 8: w lumbar spine extension · 0.15mm/px · 1 of 1 slices shown]
[im 1/1]
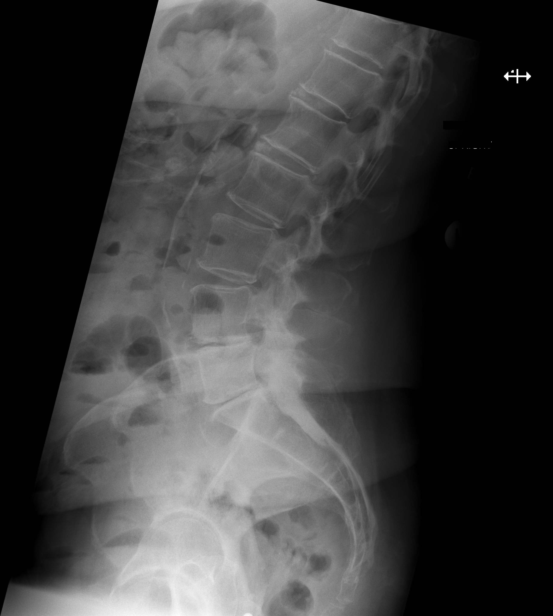

[Series 9: vasc standard · 1 of 1 slices shown (6 of 9)]
[im 1/1]
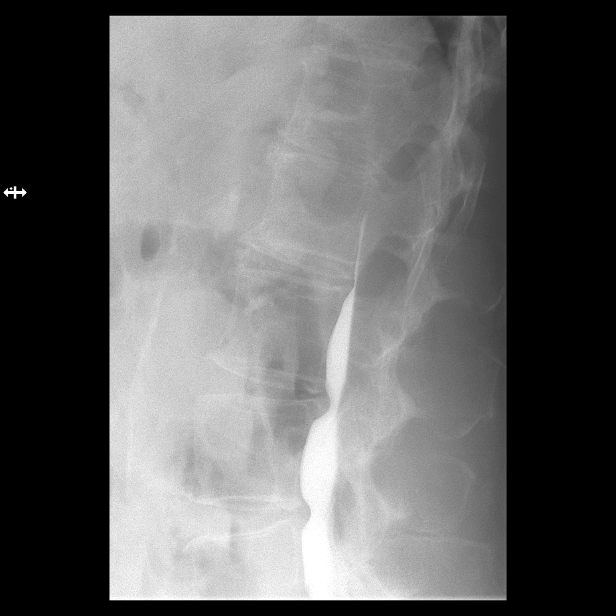

[Series 11: vasc standard · 1 of 1 slices shown (7 of 9)]
[im 1/1]
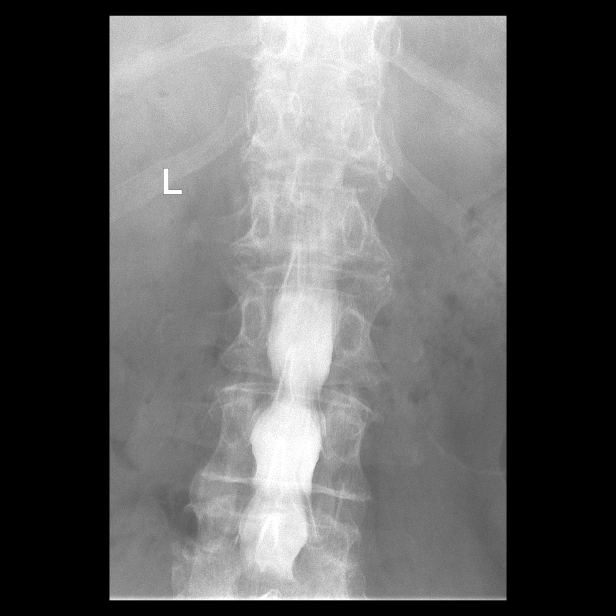

[Series 13: vasc standard · 1 of 1 slices shown (8 of 9)]
[im 1/1]
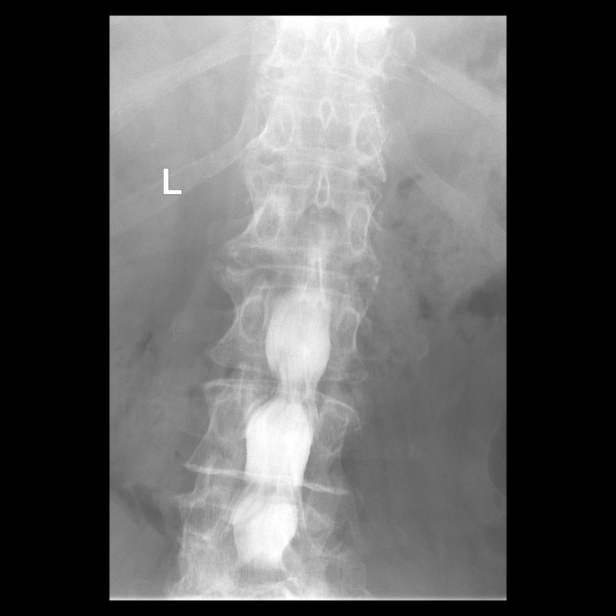

[Series 15: vasc standard · 1 of 1 slices shown (9 of 9)]
[im 1/1]
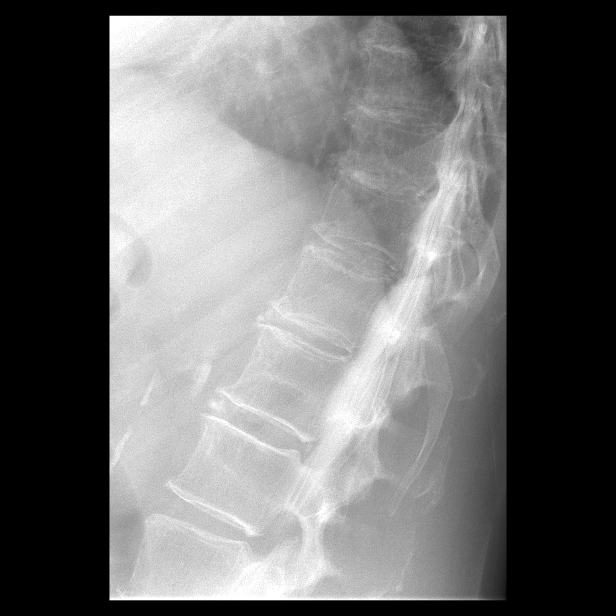

[11 of 24 positions shown; findings below may reference images not displayed]

FLUOROSCOPY TIME:  1 minute and 10 seconds corresponding to a Dose
Area Product of 418.7 ?Gy*m2

PROCEDURE:
LUMBAR PUNCTURE FOR CERVICAL LUMBAR AND THORACIC MYELOGRAM

CERVICAL AND LUMBAR AND THORACIC MYELOGRAM

CT CERVICAL MYELOGRAM

CT LUMBAR MYELOGRAM

CT THORACIC MYELOGRAM

After thorough discussion of risks and benefits of the procedure
including bleeding, infection, injury to nerves, blood vessels,
adjacent structures as well as headache and CSF leak, written and
oral informed consent was obtained. Consent was obtained by Dr. Eemer
Gavril.

Patient was positioned prone on the fluoroscopy table. Local
anesthesia was provided with 1% lidocaine without epinephrine after
prepped and draped in the usual sterile fashion. Puncture was
performed at L3-L4 using a 3 1/2 inch 22-gauge spinal needle via
midline approach. Using a single pass through the dura, the needle
was placed within the thecal sac, with return of clear CSF. 10 mL
Isovue M 300 was injected into the thecal sac, with normal
opacification of the nerve roots and cauda equina consistent with
free flow within the subarachnoid space. The patient was then moved
to the trendelenburg position and contrast flowed into the Thoracic
and Cervical spine regions.

I personally performed the lumbar puncture and administered the
intrathecal contrast. I also personally supervised acquisition of
the myelogram images.
FINDINGS: TOTAL MYELOGRAM FINDINGS:

LUMBAR: Good opacification lumbar subarachnoid space. Moderate
stenosis at L4-5, BILATERAL L5 nerve root impingement. Moderate
stenosis at L2-3, BILATERAL L3 nerve root impingement. Mild stenosis
at L3-4, BILATERAL L4 nerve root effacement. Mild stenosis at L5-S1,
with BILATERAL S1 nerve root effacement, worse on the LEFT.

Advanced disc space narrowing at L4-5 and L5-S1.

1 mm anterolisthesis L4-5 patient prone for myelography, otherwise
anatomic alignment. With patient standing, anterolisthesis at L4-5
increases to 2 mm. No significant change between flexion extension.

THORACIC: Fair opacification of the thoracic subarachnoid space.
Unremarkable thoracic spinal canal and contents. 1 mm
anterolisthesis at T1-2, and 2 mm anterolisthesis at T2-3, does not
result in significant stenosis. Multiple ventral defects are present
in the upper to midthoracic spine, described further below on CT.

CERVICAL: Good opacification of the cervical subarachnoid space.
Previous ACDF C4-C7. Solid arthrodesis C5 through C7. Pseudarthrosis
at C4-5, with fracture of the RIGHT screw. No dynamic instability.
Prominent ventral defect at C3-4 with mild stenosis. BILATERAL C4
nerve root cut off. Moderate ventral defect at C2-3.

CT CERVICAL MYELOGRAM FINDINGS:

Alignment: Anatomic.

Vertebrae: No worrisome osseous lesion.

Cord: Slight ventral cord flattening at C3-4. Slight ventral cord
flattening at C5-6.

Posterior Fossa: No tonsillar herniation.

Vertebral Arteries: Not assessed.

Paraspinal tissues: Unremarkable.

Disc levels:

C2-3: Central protrusion. BILATERAL facet arthropathy. No
impingement.

C3-4: Advanced disc space narrowing. Central protrusion with osseous
spurring. BILATERAL facet arthropathy. BILATERAL C4 foraminal
narrowing.

C4-5: Pseudarthrosis. Loose LEFT screw. Fractured RIGHT screw.
Asymmetric facet arthropathy and uncinate spurring to the LEFT.
Osseous spurring centrally. Mild stenosis. LEFT C5 foraminal
narrowing.

C5-6: Calcified central protrusion/OPLL. Mild effacement anterior
subarachnoid space and slight cord flattening. No residual foraminal
narrowing. Solid arthrodesis.

C6-7:  Solid arthrodesis.  No foraminal narrowing.

C7-T1: Mild disc space narrowing. Annular bulge. Facet arthropathy.
No impingement.

CT LUMBAR MYELOGRAM FINDINGS:

Segmentation: Normal.

Alignment:  Normal.

Vertebrae: No worrisome osseous lesion.

Conus medullaris: Normal in size,   and location.

Paraspinal tissues: No evidence for hydronephrosis or paravertebral
mass.

Disc levels:

L1-L2:  Annular bulge.  No impingement.

L2-L3: Central disc extrusion. Posterior element hypertrophy.
Moderate stenosis. BILATERAL L3 nerve root impingement. Asymmetric
foraminal narrowing on the RIGHT compresses the L2 nerve root.

L3-L4: Central protrusion. Posterior element hypertrophy. Mild
stenosis. BILATERAL L4 nerve root impingement.

L4-L5: Severe disc space narrowing. Central protrusion with osseous
spurring. Posterior element hypertrophy. Mild to moderate stenosis.
BILATERAL L5 nerve root impingement, worse on the RIGHT. BILATERAL
foraminal narrowing of a multifactorial nature, affects both L4
nerve roots, also on the RIGHT.

L5-S1: Severe disc space narrowing. Osseous spurring. Posterior
element hypertrophy. No subarticular zone narrowing, but BILATERAL
L5 nerve root impingement is seen in the foramina due to bony
overgrowth and loss of interspace height.

CT THORACIC MYELOGRAM FINDINGS:

No prevertebral or paraspinous masses. Thoracic atherosclerosis. The
heart is enlarged with coronary artery calcification. No obvious
lung lesion.

The individual disc spaces are examined as follows:

T1-2: 1 mm anterolisthesis. Annular bulge. Facet arthropathy. No
definite impingement. Behind L2, ossification posterior longitudinal
ligament.

T2-3: 1-2 mm anterolisthesis. Central protrusion. Slight cord
flattening. Facet arthropathy. Mild stenosis. No significant
impingement.

T3-4: 1-2 mm anterolisthesis. Facet arthropathy. Annular bulge. No
impingement.

T4-5: Severe disc space narrowing. Facet arthropathy. Osseous
spurring. No impingement.

T5-6: Moderate disc space narrowing. Annular bulge. No impingement.

T6-7: Severe disc space narrowing. Central protrusion. No
impingement.

T7-8: Moderate disc space narrowing. Central protrusion. Facet
arthropathy. No impingement.

T8-9: Moderate to severe disc space narrowing. Central protrusion.
Facet arthropathy. No impingement.

T9-10: Mild-to-moderate disc space narrowing. Annular bulge. Facet
arthropathy and ligamentum flavum hypertrophy. No impingement.

T10-11: Mild disc space narrowing. Central protrusion. Facet
arthropathy. Ligamentum flavum calcification. No impingement.

T11-12: Annular bulge. Posterior element hypertrophy. No
impingement.

T12-L1: Osseous spurring. Facet arthropathy. No impingement. Normal
conus.
IMPRESSION: CERVICAL MYELOGRAM AND CT: Status post C4-C7 ACDF. C4-5
pseudarthrosis. Adjacent segment disease at C3-4 with stenosis and
BILATERAL C4 nerve root impingement. Central protrusion at C2-3, not
clearly compressive.

THORACIC MYELOGRAM AND CT: Multilevel spondylosis, but no
significant spinal stenosis, or significant impingement.

LUMBAR MYELOGRAM AND CT: Mild dynamic instability at L4-5, up to 2
mm anterolisthesis with patient upright.

Moderate to severe stenosis at L2-3, with BILATERAL L3 and RIGHT L2
nerve root impingement.

Mild stenosis at L3-4, related to central protrusion and posterior
element hypertrophy, affecting both L4 nerve roots.

Mild to moderate stenosis at L4-5, related to central protrusion
posterior element hypertrophy affecting BILATERAL RIGHT greater than
LEFT L4 and L5 nerve roots.

BILATERAL foraminal narrowing at L5-S1 due to bony overgrowth and
loss of interspace height

## 2019-08-03 ENCOUNTER — Other Ambulatory Visit: Payer: Self-pay

## 2019-08-03 DIAGNOSIS — I6523 Occlusion and stenosis of bilateral carotid arteries: Secondary | ICD-10-CM

## 2019-08-04 ENCOUNTER — Telehealth (HOSPITAL_COMMUNITY): Payer: Self-pay

## 2019-08-04 NOTE — Telephone Encounter (Signed)

## 2019-08-05 ENCOUNTER — Ambulatory Visit (HOSPITAL_COMMUNITY)
Admission: RE | Admit: 2019-08-05 | Discharge: 2019-08-05 | Disposition: A | Discharge status: Home / Self Care | Payer: Medicare HMO | Source: Ambulatory Visit | Attending: Family | Admitting: Family

## 2019-08-05 ENCOUNTER — Ambulatory Visit (INDEPENDENT_AMBULATORY_CARE_PROVIDER_SITE_OTHER): Payer: Medicare HMO | Admitting: Family

## 2019-08-05 ENCOUNTER — Other Ambulatory Visit: Payer: Self-pay

## 2019-08-05 ENCOUNTER — Encounter: Payer: Self-pay | Admitting: Family

## 2019-08-05 VITALS — BP 137/67 | HR 70 | Temp 97.6°F | Resp 18 | Ht 65.0 in | Wt 182.0 lb

## 2019-08-05 DIAGNOSIS — I6523 Occlusion and stenosis of bilateral carotid arteries: Secondary | ICD-10-CM

## 2019-08-05 MED ORDER — TRULANCE 3 MG PO TABS: 3.0000 mg | ORAL_TABLET | Freq: Every day | ORAL | 2 refills | Status: DC

## 2019-08-05 NOTE — Patient Instructions (Signed)

## 2019-08-05 NOTE — Telephone Encounter (Signed)
Pt reported that even after coupon, Linzess was $100 and she cannot afford it.

## 2019-08-05 NOTE — Addendum Note (Signed)
Addended by: Oda Kilts on: 08/05/2019 03:57 PM   Modules accepted: Orders
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
## 2019-08-05 NOTE — Telephone Encounter (Signed)
Sent Trulance to patients pharmacy to see if that is any cheaper left message fo patient that Trulance was sent

## 2019-08-06 DIAGNOSIS — R69 Illness, unspecified: Secondary | ICD-10-CM | POA: Diagnosis not present

## 2019-08-07 ENCOUNTER — Encounter

## 2019-08-07 ENCOUNTER — Telehealth: Payer: Self-pay | Admitting: Gastroenterology

## 2019-08-07 DIAGNOSIS — G4733 Obstructive sleep apnea (adult) (pediatric): Secondary | ICD-10-CM | POA: Diagnosis not present

## 2019-08-07 NOTE — Telephone Encounter (Signed)
Changed from Linzess to Trulance due to poor insurance coverage. Trulance is not covered. Any other options?

## 2019-08-10 MED ORDER — LUBIPROSTONE 24 MCG PO CAPS: 24.0000 ug | ORAL_CAPSULE | Freq: Two times a day (BID) | ORAL | 3 refills | Status: DC

## 2019-08-10 NOTE — Telephone Encounter (Signed)
Can you please check if Amitiza 24 mcg twice daily is covered?  Thank you

## 2019-08-10 NOTE — Telephone Encounter (Signed)
Left message for pt that prescription has been sent to her pharmacy for amitiza to see how that is covered by her insurance.

## 2019-08-13 ENCOUNTER — Ambulatory Visit: Payer: Medicare HMO | Admitting: Cardiovascular Disease

## 2019-08-13 ENCOUNTER — Encounter: Payer: Self-pay | Admitting: Cardiovascular Disease

## 2019-08-13 ENCOUNTER — Other Ambulatory Visit: Payer: Self-pay

## 2019-08-13 VITALS — BP 120/60 | HR 67 | Ht 65.0 in | Wt 181.1 lb

## 2019-08-13 DIAGNOSIS — R0602 Shortness of breath: Secondary | ICD-10-CM

## 2019-08-13 DIAGNOSIS — I25118 Atherosclerotic heart disease of native coronary artery with other forms of angina pectoris: Secondary | ICD-10-CM | POA: Diagnosis not present

## 2019-08-13 DIAGNOSIS — R609 Edema, unspecified: Secondary | ICD-10-CM

## 2019-08-13 DIAGNOSIS — R002 Palpitations: Secondary | ICD-10-CM | POA: Diagnosis not present

## 2019-08-13 MED ORDER — FUROSEMIDE 40 MG PO TABS: 40.0000 mg | ORAL_TABLET | Freq: Every day | ORAL | 3 refills | Status: DC

## 2019-08-13 MED ORDER — POTASSIUM CHLORIDE CRYS ER 10 MEQ PO TBCR: 10.0000 meq | EXTENDED_RELEASE_TABLET | Freq: Every day | ORAL | 3 refills | Status: DC

## 2019-08-13 NOTE — Progress Notes (Signed)
Cardiology Office Note:    Date:  08/13/2019   ID:  Jeanne Kim, DOB 1941/02/28, MRN [id number]  PCP:  Neale Burly, MD  Cardiologist:  Sherren Mocha, MD  Electrophysiologist:  None   Referring MD: Neale Burly, MD   Chief Complaint  Patient presents with  . Shortness of Breath  . Leg Swelling    History of Present Illness:    Jeanne Kim is a 78 y.o. female with a hx of nonobstructive CAD, presenting for follow-up evaluation. She underwent heart catheterization in 2006 demonstrating mild nonobstructive CAD with 20-30% stenosis of the left main and LAD. There is no other significant disease noted. Left ventricular function was normal. She is here alone today. She complains of increased problems with leg swelling. Notes some improvement with use of furosemide as needed. She also has periods of very slow heart rates around 50 bpm with decreased O2 levels then other times will have elevated heart rates even at rest with rates in the 110-120 bpm range. She has rare chest pain with no recent change in pattern or acceleration of symptoms. She complains of 2-3 pillow orthopnea, no PND.   Past Medical History:  Diagnosis Date  . Allergy   . Anemia   . Anginal pain ([place])   . Anxiety   . Arthritis   . Blood transfusion without reported diagnosis   . Breast cancer ([place])   . Carotid artery plaque    bilat  . Cataract   . Colon polyps    adenomatous  . Concussion 02/2016   Driving restrictions for 6w  . Coronary heart disease   . Depression   . Diverticulosis   . Dizziness   . Esophageal reflux   . Esophageal stricture   . Fibromyalgia   . Gallstones   . Hiatal hernia   . Hypercholesterolemia   . Hypertension   . Hypothyroidism   . IBS (irritable bowel syndrome)   . Kidney stones   . Osteoporosis   . Pneumonia    recently, just finished ABO  . Pneumonia   . PONV (postoperative nausea and vomiting)   . Sleep apnea    CPAP  . Thyroid disease 2002  . Type II or  unspecified type diabetes mellitus without mention of complication, not stated as uncontrolled   . Unspecified gastritis and gastroduodenitis without mention of hemorrhage     Past Surgical History:  Procedure Laterality Date  . ANAL RECTAL MANOMETRY N/A 08/05/2015   Procedure: ANO RECTAL MANOMETRY;  Surgeon: Mauri Pole, MD;  Location: [facility];  Service: Endoscopy;  Laterality: N/A;  . APPENDECTOMY    . BREAST LUMPECTOMY     left  . CARDIAC CATHETERIZATION    . CATARACT EXTRACTION W/PHACO Left 03/30/2013   Procedure: CATARACT EXTRACTION PHACO AND INTRAOCULAR LENS PLACEMENT (IOC);  Surgeon: Tonny Branch, MD;  Location: [facility];  Service: Ophthalmology;  Laterality: Left;  CDE:19.28  . CATARACT EXTRACTION W/PHACO Right 04/09/2013   Procedure: CATARACT EXTRACTION PHACO AND INTRAOCULAR LENS PLACEMENT (IOC);  Surgeon: Tonny Branch, MD;  Location: [facility];  Service: Ophthalmology;  Laterality: Right;  CDE: 15.44  . CERVICAL FUSION    . CESAREAN SECTION    . CHOLECYSTECTOMY    . CYSTOCELE REPAIR    . DILATION AND CURETTAGE OF UTERUS    . EYE SURGERY Left 03-30-13   Cataract  . EYE SURGERY Right 04-09-13   Cataract  . RECTOCELE REPAIR    . THYROIDECTOMY    .  TONSILLECTOMY    . TOTAL ABDOMINAL HYSTERECTOMY      Current Medications: Current Meds  Medication Sig  . aspirin 81 MG chewable tablet Chew 81 mg by mouth daily.  Marland Kitchen atorvastatin (LIPITOR) 20 MG tablet Take 1 tablet (20 mg total) by mouth daily.  . calcium-vitamin D (OSCAL WITH D) 500-200 MG-UNIT per tablet Take 1 tablet by mouth 2 (two) times daily.  . Coenzyme Q10 (CO Q-10) 100 MG CAPS Take 100 mg by mouth daily.  . cyanocobalamin (,VITAMIN B-12,) 1000 MCG/ML injection INJECT 1 ML INTO MUSCLE ONCE A MONTH  . DULoxetine (CYMBALTA) 60 MG capsule Take 60 mg by mouth daily.  Marland Kitchen EPINEPHrine 0.3 mg/0.3 mL IJ SOAJ injection Frequency:PHARMDIR   Dosage:0.0     Instructions:  Note:self inject in the event of allergic reaction, then  proceed immediately to the nearest Emergency Room. Dose: 1 INJECTION  . fentaNYL (DURAGESIC - DOSED MCG/HR) 25 MCG/HR patch Place 25 mcg onto the skin every 3 (three) days.  . fexofenadine (ALLEGRA) 30 MG tablet Take 30 mg by mouth daily.  . fluticasone (FLONASE) 50 MCG/ACT nasal spray Place 2 sprays into the nose daily.  . furosemide (LASIX) 40 MG tablet Take 1 tablet (40 mg total) by mouth daily.  Marland Kitchen gabapentin (NEURONTIN) 300 MG capsule Take 300 mg by mouth at bedtime.  Marland Kitchen glipiZIDE (GLUCOTROL) 10 MG tablet Take 10 mg by mouth daily before breakfast.  . Insulin Glargine-Lixisenatide (SOLIQUA) 100-33 UNT-MCG/ML SOPN Inject 20 Units into the skin.  Marland Kitchen levothyroxine (SYNTHROID, LEVOTHROID) 125 MCG tablet Take 1 tablet by mouth daily.  Marland Kitchen lidocaine (LIDODERM) 5 % Place 1 patch onto the skin daily. Remove & Discard patch within 12 hours or as directed by MD  . lubiprostone (AMITIZA) 24 MCG capsule Take 1 capsule (24 mcg total) by mouth 2 (two) times daily with a meal.  . Melatonin 10 MG TABS Take by mouth.  . metFORMIN (GLUCOPHAGE) 1000 MG tablet Take 1,000 mg by mouth 2 (two) times daily with a meal.  . methocarbamol (ROBAXIN) 750 MG tablet Take 750 mg by mouth every 8 (eight) hours.  . metoprolol succinate (TOPROL-XL) 50 MG 24 hr tablet Take 1 tablet (50 mg total) by mouth daily.  . midodrine (PROAMATINE) 2.5 MG tablet Take 2.5 mg by mouth 3 (three) times daily.  . montelukast (SINGULAIR) 10 MG tablet Take 10 mg by mouth at bedtime.  . naproxen sodium (ALEVE) 220 MG tablet Take 220 mg by mouth.  . Needles & Syringes MISC 25G 5/8 inch needle on 3 ml syringe-Use once monthly for B12 injections  . nitroGLYCERIN (NITROSTAT) 0.4 MG SL tablet Place 1 tablet (0.4 mg total) under the tongue every 5 (five) minutes as needed for chest pain.  . Omega-3 Fatty Acids (FISH OIL PO) Take by mouth.  . ondansetron (ZOFRAN) 4 MG tablet Take 1 tablet (4 mg total) by mouth every 8 (eight) hours as needed for nausea or  vomiting.  Glory Rosebush VERIO test strip 1 each by Other route as directed.   . pantoprazole (PROTONIX) 40 MG tablet TAKE 1 TABLET (40 MG TOTAL) BY MOUTH 2 (TWO) TIMES DAILY.  Marland Kitchen potassium chloride (KLOR-CON) 10 MEQ tablet Take 1 tablet (10 mEq total) by mouth daily for 360 doses.  . ramipril (ALTACE) 5 MG capsule Take 1 capsule (5 mg total) by mouth at bedtime.  . [DISCONTINUED] furosemide (LASIX) 40 MG tablet Take 1 tablet (40 mg total) by mouth daily. Please schedule an office visit  for further refills  . [DISCONTINUED] potassium chloride (K-DUR,KLOR-CON) 10 MEQ tablet Take 10 mEq by mouth once. Take one tablet every time you take lasix   Current Facility-Administered Medications for the 08/13/19 encounter (Office Visit) with Sherren Mocha, MD  Medication  . 0.9 %  sodium chloride infusion  . 0.9 %  sodium chloride infusion     Allergies:   Codeine and Sulfonamide derivatives   Social History   Socioeconomic History  . Marital status: Married    Spouse name: Not on file  . Number of children: 4  . Years of education: Not on file  . Highest education level: Not on file  Occupational History  . Occupation: Retired Optician, dispensing: RETIRED  Social Needs  . Financial resource strain: Not on file  . Food insecurity    Worry: Not on file    Inability: Not on file  . Transportation needs    Medical: Not on file    Non-medical: Not on file  Tobacco Use  . Smoking status: Never Smoker  . Smokeless tobacco: Never Used  Substance and Sexual Activity  . Alcohol use: No  . Drug use: No  . Sexual activity: Not on file  Lifestyle  . Physical activity    Days per week: Not on file    Minutes per session: Not on file  . Stress: Not on file  Relationships  . Social Herbalist on phone: Not on file    Gets together: Not on file    Attends religious service: Not on file    Active member of club or organization: Not on file    Attends meetings of clubs or organizations:  Not on file    Relationship status: Not on file  Other Topics Concern  . Not on file  Social History Narrative  . Not on file     Family History: The patient's family history includes Cancer in her father and mother; Colon cancer in her maternal uncle; Colon polyps in her maternal uncle; Diabetes in her father, maternal aunt, maternal uncle, and mother; Esophageal cancer in her maternal uncle; Heart disease in her father and paternal grandmother; Hyperlipidemia in her father; Hypertension in her father and mother; Lung cancer in her father and maternal uncle; Ovarian cancer in her maternal aunt; Sarcoidosis in her mother; Urolithiasis in her father.  ROS:   Please see the history of present illness.    All other systems reviewed and are negative.  EKGs/Labs/Other Studies Reviewed:    EKG:  EKG is ordered today.  The ekg ordered today demonstrates NSR 67 bpm, possible age-indeterminate anterior infarct.   Recent Labs: No results found for requested labs within last 8760 hours.  Recent Lipid Panel    Component Value Date/Time   CHOL 123 01/20/2013 1626   TRIG 255.0 (H) 01/20/2013 1626   HDL 41.10 01/20/2013 1626   CHOLHDL 3 01/20/2013 1626   VLDL 51.0 (H) 01/20/2013 1626   LDLCALC 41 12/28/2008 1140   LDLDIRECT 46.2 01/20/2013 1626    Physical Exam:    VS:  BP 120/60   Pulse 67   Ht 5\' 5"  (1.651 m)   Wt 181 lb 1.9 oz (82.2 kg)   SpO2 96%   BMI 30.14 kg/m     Wt Readings from Last 3 Encounters:  08/13/19 181 lb 1.9 oz (82.2 kg)  08/05/19 182 lb (82.6 kg)  07/10/19 181 lb 9.6 oz (82.4 kg)  GEN:  Well nourished, well developed in no acute distress HEENT: Normal NECK: No JVD; No carotid bruits LYMPHATICS: No lymphadenopathy CARDIAC: RRR, no murmurs, rubs, gallops RESPIRATORY:  Clear to auscultation without rales, wheezing or rhonchi  ABDOMEN: Soft, non-tender, non-distended MUSCULOSKELETAL:  2+ bilateral pretibial edema; No deformity  SKIN: Warm and dry  NEUROLOGIC:  Alert and oriented x 3 PSYCHIATRIC:  Normal affect   ASSESSMENT:    1. Edema, unspecified type   2. Shortness of breath   3. Palpitations   4. Coronary artery disease involving native coronary artery of native heart with other form of angina pectoris ([place])    PLAN:    In order of problems listed above:  1. BL leg edema, unclear etiology. Venous stasis/CHF both in differential. Discussed compression (she wears stockings), leg elevation, sodium restriction. Check 2D echo. Change lasix to 40 mg daily, KDur 10 meq daily. BMET when she comes in for echo. 2. Suspect multifactorial. Check echo to evaluate for CHF. 3. EKG reviewed and essentially normal. Check 2 week Zio monitor. 4. Atypical chest pain unchanged over time. Mild nonobstructive disease by cardiac cath in the past.   Medication Adjustments/Labs and Tests Ordered: Current medicines are reviewed at length with the patient today.  Concerns regarding medicines are outlined above.  Orders Placed This Encounter  Procedures  . Basic Metabolic Panel (BMET)  . LONG TERM MONITOR (3-14 DAYS)  . EKG 12-Lead  . ECHOCARDIOGRAM COMPLETE   Meds ordered this encounter  Medications  . furosemide (LASIX) 40 MG tablet    Sig: Take 1 tablet (40 mg total) by mouth daily.    Dispense:  90 tablet    Refill:  3  . potassium chloride (KLOR-CON) 10 MEQ tablet    Sig: Take 1 tablet (10 mEq total) by mouth daily for 360 doses.    Dispense:  90 tablet    Refill:  3    Patient Instructions  Medication Instructions:  1) Take your Lasix daily 2) Take your potassium daily  Testing/Procedures: Your physician has requested that you have an echocardiogram. Echocardiography is a painless test that uses sound waves to create images of your heart. It provides your doctor with information about the size and shape of your heart and how well your heart's chambers and valves are working. This procedure takes approximately one hour. There are no  restrictions for this procedure.   Dr. Burt Knack recommends you wear a monitor for 2 weeks. This will be mailed to your home.  Follow-Up: At [facility], you and your health needs are our priority.  As part of our continuing mission to provide you with exceptional heart care, we have created designated Provider Care Teams.  These Care Teams include your primary Cardiologist (physician) and Advanced Practice Providers (APPs -  Physician Assistants and Nurse Practitioners) who all work together to provide you with the care you need, when you need it. Your next appointment:   12 months The format for your next appointment:   In Person Provider:   You may see Sherren Mocha, MD or one of the following Advanced Practice Providers on your designated Care Team:    Richardson Dopp, PA-C  Vin [place], PA-C  Daune Perch, [place]     Signed, Sherren Mocha, MD  08/13/2019 5:05 PM    [place]

## 2019-08-13 NOTE — Patient Instructions (Addendum)
Medication Instructions:  1) Take your Lasix daily 2) Take your potassium daily  Testing/Procedures: Your physician has requested that you have an echocardiogram. Echocardiography is a painless test that uses sound waves to create images of your heart. It provides your doctor with information about the size and shape of your heart and how well your heart's chambers and valves are working. This procedure takes approximately one hour. There are no restrictions for this procedure.   Dr. Burt Knack recommends you wear a monitor for 2 weeks. This will be mailed to your home.  Follow-Up: At [facility], you and your health needs are our priority.  As part of our continuing mission to provide you with exceptional heart care, we have created designated Provider Care Teams.  These Care Teams include your primary Cardiologist (physician) and Advanced Practice Providers (APPs -  Physician Assistants and Nurse Practitioners) who all work together to provide you with the care you need, when you need it. Your next appointment:   12 months The format for your next appointment:   In Person Provider:   You may see Sherren Mocha, MD or one of the following Advanced Practice Providers on your designated Care Team:    Richardson Dopp, PA-C  Vin [place], [place]  Daune Perch, [place]

## 2019-08-14 ENCOUNTER — Other Ambulatory Visit: Payer: Self-pay | Admitting: Cardiovascular Disease

## 2019-08-15 DIAGNOSIS — R69 Illness, unspecified: Secondary | ICD-10-CM | POA: Diagnosis not present

## 2019-08-18 ENCOUNTER — Telehealth: Payer: Self-pay | Admitting: Cardiovascular Disease

## 2019-08-18 NOTE — Telephone Encounter (Signed)
I spoke to the patient who called about her Potassium medication.  Apparently, she has not received a call from [facility] that it was available for pick up.  She will reach out to them to clarify.

## 2019-08-18 NOTE — Telephone Encounter (Signed)
New Message      Pt c/o medication issue:  1. Name of Medication: Potassium   2. How are you currently taking this medication (dosage and times per day)? No currently taking   3. Are you having a reaction (difficulty breathing--STAT)? No   4. What is your medication issue? Pt is wondering Pt is wondering if she is needing to buy over the counter potassium or get a prescription for it    Please call

## 2019-08-21 ENCOUNTER — Other Ambulatory Visit: Payer: Medicare HMO | Admitting: *Deleted

## 2019-08-21 ENCOUNTER — Telehealth: Payer: Self-pay | Admitting: Gastroenterology

## 2019-08-21 ENCOUNTER — Other Ambulatory Visit: Payer: Self-pay

## 2019-08-21 ENCOUNTER — Ambulatory Visit (HOSPITAL_COMMUNITY): Payer: Medicare HMO | Attending: Cardiovascular Disease

## 2019-08-21 DIAGNOSIS — R0602 Shortness of breath: Secondary | ICD-10-CM | POA: Diagnosis not present

## 2019-08-21 DIAGNOSIS — R002 Palpitations: Secondary | ICD-10-CM

## 2019-08-21 DIAGNOSIS — R609 Edema, unspecified: Secondary | ICD-10-CM | POA: Insufficient documentation

## 2019-08-21 MED ORDER — PERFLUTREN LIPID MICROSPHERE
1.0000 mL | INTRAVENOUS | Status: AC | PRN
Start: 2019-08-21 — End: 2019-08-21
  Administered 2019-08-21: 1 mL via INTRAVENOUS

## 2019-08-21 NOTE — Telephone Encounter (Signed)
Pt states that she is still very constipated. She thinks that she has a blockage because she has been having nausea. She would like some advise.

## 2019-08-21 NOTE — Telephone Encounter (Signed)
Patient states she has abdominal discomfort, passing gas and at times a bit of nausea. She states she has not had a passage of stool in "3 weeks." Passing gas makes her feel better. No vomiting. Able to eat. She is willing to try a bowel purge. Does not want to go to the ER. Discussed Miralax purge. She is diabetic. Kidney functions last checked in October WNL's  Takes chronic pain medication. She asks about magnesium citrate. Discussed using a half of the bottle tonight. Repeat tomorrow if she does not have satisfactory results.  Follow up next week.

## 2019-08-22 LAB — BASIC METABOLIC PANEL
BUN/Creatinine Ratio: 25 (ref 12–28)
BUN: 26 mg/dL (ref 8–27)
CO2: 28 mmol/L (ref 20–29)
Calcium: 8.8 mg/dL (ref 8.7–10.3)
Chloride: 88 mmol/L — ABNORMAL LOW (ref 96–106)
Creatinine, Ser: 1.05 mg/dL — ABNORMAL HIGH (ref 0.57–1.00)
GFR calc Af Amer: 60 mL/min/{1.73_m2} (ref >59)
GFR calc non Af Amer: 52 mL/min/{1.73_m2} — ABNORMAL LOW (ref >59)
Glucose: 201 mg/dL — ABNORMAL HIGH (ref 65–99)
Potassium: 3.2 mmol/L — ABNORMAL LOW (ref 3.5–5.2)
Sodium: 136 mmol/L (ref 134–144)

## 2019-08-24 ENCOUNTER — Telehealth: Payer: Self-pay

## 2019-08-24 DIAGNOSIS — I25118 Atherosclerotic heart disease of native coronary artery with other forms of angina pectoris: Secondary | ICD-10-CM

## 2019-08-24 MED ORDER — POTASSIUM CHLORIDE CRYS ER 10 MEQ PO TBCR: 10.0000 meq | EXTENDED_RELEASE_TABLET | Freq: Two times a day (BID) | ORAL | 3 refills | Status: DC

## 2019-08-24 NOTE — Telephone Encounter (Signed)
-----   Message from Sherren Mocha, MD sent at 08/24/2019  6:22 AM EST ----- Increase KDur to 10 meq BID, thx

## 2019-08-24 NOTE — Telephone Encounter (Signed)
Reviewed results with patient who verbalized understanding.   Instructed patient to INCREASE KDUR to 10 med BID. Repeat BMET will be drawn 12/1. She was grateful for call and agrees with treatment plan.

## 2019-08-24 NOTE — Telephone Encounter (Signed)
Left message on machine to call back  

## 2019-08-24 NOTE — Telephone Encounter (Signed)
Given her co morbidities, its better to do either Miralax or Golytly purge. Can you please get her samples for Linzess 253mch or Trulance 3mg . She has higher out of pocket expense with her insurance. Thanks

## 2019-08-25 MED ORDER — TRULANCE 3 MG PO TABS: 1.0000 | ORAL_TABLET | Freq: Every day | ORAL | 0 refills | Status: DC

## 2019-08-25 NOTE — Telephone Encounter (Signed)
Spoke with the patient. She did choose the Magnesium Citrate. She has "cleaned out" and can tell a difference. Willing to try the daily medication now.  Samples of Trulance at the front desk for her to pick up. Take one daily. She will call with follow up 1 week.

## 2019-08-26 ENCOUNTER — Telehealth: Payer: Self-pay | Admitting: *Deleted

## 2019-08-26 ENCOUNTER — Ambulatory Visit: Payer: Medicare HMO | Admitting: Cardiology

## 2019-08-26 NOTE — Telephone Encounter (Signed)
Patient calling for results of Echocardiogram

## 2019-08-26 NOTE — Telephone Encounter (Signed)
14 day ZIO XT long term holter monitor to be mailed to the patient home.  Instructions reviewed briefly as they are included in the monitor kit. 

## 2019-08-26 NOTE — Telephone Encounter (Signed)
Reviewed echo results again. She was grateful for call.

## 2019-09-07 DIAGNOSIS — G4733 Obstructive sleep apnea (adult) (pediatric): Secondary | ICD-10-CM | POA: Diagnosis not present

## 2019-09-08 ENCOUNTER — Other Ambulatory Visit: Payer: Self-pay

## 2019-09-08 ENCOUNTER — Other Ambulatory Visit: Payer: Medicare HMO

## 2019-09-08 DIAGNOSIS — I25118 Atherosclerotic heart disease of native coronary artery with other forms of angina pectoris: Secondary | ICD-10-CM | POA: Diagnosis not present

## 2019-09-09 LAB — BASIC METABOLIC PANEL
BUN/Creatinine Ratio: 18 (ref 12–28)
BUN: 16 mg/dL (ref 8–27)
CO2: 28 mmol/L (ref 20–29)
Calcium: 8.6 mg/dL — ABNORMAL LOW (ref 8.7–10.3)
Chloride: 99 mmol/L (ref 96–106)
Creatinine, Ser: 0.87 mg/dL (ref 0.57–1.00)
GFR calc Af Amer: 75 mL/min/{1.73_m2} (ref >59)
GFR calc non Af Amer: 65 mL/min/{1.73_m2} (ref >59)
Glucose: 297 mg/dL — ABNORMAL HIGH (ref 65–99)
Potassium: 4.8 mmol/L (ref 3.5–5.2)
Sodium: 141 mmol/L (ref 134–144)

## 2019-09-13 ENCOUNTER — Ambulatory Visit (INDEPENDENT_AMBULATORY_CARE_PROVIDER_SITE_OTHER): Payer: Medicare HMO

## 2019-09-13 DIAGNOSIS — R002 Palpitations: Secondary | ICD-10-CM

## 2019-09-30 DIAGNOSIS — Z6829 Body mass index (BMI) 29.0-29.9, adult: Secondary | ICD-10-CM | POA: Diagnosis not present

## 2019-09-30 DIAGNOSIS — M545 Low back pain: Secondary | ICD-10-CM | POA: Diagnosis not present

## 2019-09-30 DIAGNOSIS — I10 Essential (primary) hypertension: Secondary | ICD-10-CM | POA: Diagnosis not present

## 2019-09-30 DIAGNOSIS — G4733 Obstructive sleep apnea (adult) (pediatric): Secondary | ICD-10-CM | POA: Diagnosis not present

## 2019-09-30 DIAGNOSIS — E038 Other specified hypothyroidism: Secondary | ICD-10-CM | POA: Diagnosis not present

## 2019-09-30 DIAGNOSIS — E1121 Type 2 diabetes mellitus with diabetic nephropathy: Secondary | ICD-10-CM | POA: Diagnosis not present

## 2019-09-30 DIAGNOSIS — E782 Mixed hyperlipidemia: Secondary | ICD-10-CM | POA: Diagnosis not present

## 2019-10-01 DIAGNOSIS — E038 Other specified hypothyroidism: Secondary | ICD-10-CM | POA: Diagnosis not present

## 2019-10-01 DIAGNOSIS — E1121 Type 2 diabetes mellitus with diabetic nephropathy: Secondary | ICD-10-CM | POA: Diagnosis not present

## 2019-10-01 DIAGNOSIS — R531 Weakness: Secondary | ICD-10-CM | POA: Diagnosis not present

## 2019-10-01 DIAGNOSIS — I10 Essential (primary) hypertension: Secondary | ICD-10-CM | POA: Diagnosis not present

## 2019-10-01 DIAGNOSIS — E782 Mixed hyperlipidemia: Secondary | ICD-10-CM | POA: Diagnosis not present

## 2019-10-01 DIAGNOSIS — M4802 Spinal stenosis, cervical region: Secondary | ICD-10-CM | POA: Diagnosis not present

## 2019-10-01 DIAGNOSIS — M5031 Other cervical disc degeneration,  high cervical region: Secondary | ICD-10-CM | POA: Diagnosis not present

## 2019-10-07 DIAGNOSIS — G4733 Obstructive sleep apnea (adult) (pediatric): Secondary | ICD-10-CM | POA: Diagnosis not present

## 2019-10-08 ENCOUNTER — Encounter

## 2019-10-08 DIAGNOSIS — G4733 Obstructive sleep apnea (adult) (pediatric): Secondary | ICD-10-CM | POA: Diagnosis not present

## 2019-10-13 DIAGNOSIS — R69 Illness, unspecified: Secondary | ICD-10-CM | POA: Diagnosis not present

## 2019-10-16 ENCOUNTER — Telehealth: Payer: Self-pay | Admitting: Gastroenterology

## 2019-10-19 NOTE — Telephone Encounter (Signed)
Dr Nandigam please advise 

## 2019-10-19 NOTE — Telephone Encounter (Signed)
Jeanne Kim, can you please check which laxative is covered by her insurance.  I think Linzess and Trulance were not covered either.  Maybe we can try to get her some samples.  Thank you

## 2019-10-26 NOTE — Telephone Encounter (Signed)
attempted to contact patient and her mail box was full and could not leave a mesage

## 2019-12-02 ENCOUNTER — Other Ambulatory Visit: Payer: Self-pay | Admitting: Cardiovascular Disease

## 2020-01-27 ENCOUNTER — Other Ambulatory Visit: Payer: Self-pay | Admitting: Internal Medicine

## 2020-01-27 DIAGNOSIS — G8929 Other chronic pain: Secondary | ICD-10-CM

## 2020-01-27 DIAGNOSIS — M545 Low back pain, unspecified: Secondary | ICD-10-CM

## 2020-02-03 DIAGNOSIS — Z8781 Personal history of (healed) traumatic fracture: Secondary | ICD-10-CM | POA: Insufficient documentation

## 2020-02-04 ENCOUNTER — Inpatient Hospital Stay: Admission: RE | Admit: 2020-02-04 | Payer: Medicare HMO | Source: Ambulatory Visit

## 2020-02-04 DIAGNOSIS — M80052A Age-related osteoporosis with current pathological fracture, left femur, initial encounter for fracture: Secondary | ICD-10-CM | POA: Insufficient documentation

## 2020-02-04 DIAGNOSIS — R600 Localized edema: Secondary | ICD-10-CM | POA: Insufficient documentation

## 2020-02-05 DIAGNOSIS — D649 Anemia, unspecified: Secondary | ICD-10-CM | POA: Diagnosis present

## 2020-02-05 DIAGNOSIS — D509 Iron deficiency anemia, unspecified: Secondary | ICD-10-CM | POA: Diagnosis present

## 2020-02-05 DIAGNOSIS — E878 Other disorders of electrolyte and fluid balance, not elsewhere classified: Secondary | ICD-10-CM | POA: Insufficient documentation

## 2020-02-06 DIAGNOSIS — D72829 Elevated white blood cell count, unspecified: Secondary | ICD-10-CM

## 2020-02-06 DIAGNOSIS — D72823 Leukemoid reaction: Secondary | ICD-10-CM

## 2020-02-24 DIAGNOSIS — F1190 Opioid use, unspecified, uncomplicated: Secondary | ICD-10-CM | POA: Insufficient documentation

## 2020-02-24 DIAGNOSIS — Z923 Personal history of irradiation: Secondary | ICD-10-CM | POA: Insufficient documentation

## 2020-02-24 DIAGNOSIS — Z9181 History of falling: Secondary | ICD-10-CM | POA: Insufficient documentation

## 2020-02-24 DIAGNOSIS — M810 Age-related osteoporosis without current pathological fracture: Secondary | ICD-10-CM | POA: Insufficient documentation

## 2020-03-21 DIAGNOSIS — Z9889 Other specified postprocedural states: Secondary | ICD-10-CM | POA: Insufficient documentation

## 2020-03-21 DIAGNOSIS — Z96642 Presence of left artificial hip joint: Secondary | ICD-10-CM | POA: Insufficient documentation

## 2020-03-22 DIAGNOSIS — G8929 Other chronic pain: Secondary | ICD-10-CM | POA: Insufficient documentation

## 2020-03-22 DIAGNOSIS — F329 Major depressive disorder, single episode, unspecified: Secondary | ICD-10-CM | POA: Insufficient documentation

## 2020-03-22 DIAGNOSIS — G4700 Insomnia, unspecified: Secondary | ICD-10-CM | POA: Insufficient documentation

## 2020-03-25 DIAGNOSIS — E876 Hypokalemia: Secondary | ICD-10-CM | POA: Insufficient documentation

## 2020-04-09 ENCOUNTER — Other Ambulatory Visit: Payer: Self-pay | Admitting: Gastroenterology

## 2020-07-04 ENCOUNTER — Ambulatory Visit: Payer: Medicare HMO | Admitting: Physician Assistant

## 2020-07-04 ENCOUNTER — Telehealth: Payer: Self-pay | Admitting: Cardiovascular Disease

## 2020-07-04 ENCOUNTER — Other Ambulatory Visit: Payer: Self-pay

## 2020-07-04 ENCOUNTER — Encounter: Payer: Self-pay | Admitting: Physician Assistant

## 2020-07-04 VITALS — BP 130/60 | HR 75 | Ht 65.0 in | Wt 176.6 lb

## 2020-07-04 DIAGNOSIS — M7989 Other specified soft tissue disorders: Secondary | ICD-10-CM

## 2020-07-04 DIAGNOSIS — E039 Hypothyroidism, unspecified: Secondary | ICD-10-CM

## 2020-07-04 DIAGNOSIS — I25118 Atherosclerotic heart disease of native coronary artery with other forms of angina pectoris: Secondary | ICD-10-CM | POA: Diagnosis not present

## 2020-07-04 DIAGNOSIS — I6523 Occlusion and stenosis of bilateral carotid arteries: Secondary | ICD-10-CM

## 2020-07-04 DIAGNOSIS — I10 Essential (primary) hypertension: Secondary | ICD-10-CM

## 2020-07-04 NOTE — Telephone Encounter (Signed)
Patient returned your call.

## 2020-07-04 NOTE — Progress Notes (Signed)
Cardiology Office Note:    Date:  07/04/2020   ID:  [name], DOB Oct 23, 1940, MRN [id number]  PCP:  Neale Burly, MD  [facility] Cardiologist:  Sherren Mocha, MD  [facility] Electrophysiologist:  None   Referring MD: Neale Burly, MD   Chief Complaint:  Leg Swelling    Patient Profile:    Jeanne Kim is a 79 y.o. female with:   Coronary artery disease  Cath in 2006 with mild nonobstructive disease   Myoview 4/17: low risk  Leg swelling  Breast CA  Carotid artery disease  [place] 07/2019: L 40-59; R 1-39  Followed by [facility]  GERD  Fibromyalgia  IBS  Hypertension  Hyperlipidemia  Sleep apnea  Diabetes mellitus  Hypothyroidism  L hip fx in 4/21; s/p L THR 5/21  Prior CV studies: Cardiac monitor 10/2019 NSR, average HR 71; occasional short supraventricular runs; no sustained arrhythmias, bradycardic events, heart block or atrial fibrillation  Echocardiogram 08/21/2019 EF 60-65, borderline LVH, GR 1 DD, no RWMA, normal RVSF, trivial pericardial effusion, trivial TR, mild AI, mild to moderate aortic valve sclerosis (no aortic stenosis), RVSP 31.1  Carotid US 08/05/2019  R 1-39; L 40-59  Myoview 01/10/2016 EF 65, no ischemia or infarction; low risk  Cardiac catheterization 10/17/04 LM normal LAD mid 30-40 LCx normal RCA normal  Normal LVF  History of Present Illness:    Jeanne Kim was last seen by Dr. Burt Knack in November 2020.  She called in today with increased leg swelling.  She is added on for evaluation.  She is here alone today.  She has noted worsening leg swelling over the past 2 weeks.  She has been taking furosemide a little over a week without significant improvement.  She has not had orthopnea.  She sleeps on 2 pillows chronically.  She uses CPAP at night.  She has occasional chest tightness.  This is a chronic symptom without change.  She has shortness of breath with some activities.  There has been no significant change.  She  has noted some lightheadedness as well as nausea since her swelling began.  She has not had any recent hospitalizations or prolonged travel.      Past Medical History:  Diagnosis Date  . Allergy   . Anemia   . Anginal pain ([place])   . Anxiety   . Arthritis   . Blood transfusion without reported diagnosis   . Breast cancer ([place])   . Carotid artery plaque    bilat  . Cataract   . Colon polyps    adenomatous  . Concussion 02/2016   Driving restrictions for 6w  . Coronary heart disease   . Depression   . Diverticulosis   . Dizziness   . Esophageal reflux   . Esophageal stricture   . Fibromyalgia   . Gallstones   . Hiatal hernia   . Hypercholesterolemia   . Hypertension   . Hypothyroidism   . IBS (irritable bowel syndrome)   . Kidney stones   . Osteoporosis   . Pneumonia    recently, just finished ABO  . Pneumonia   . PONV (postoperative nausea and vomiting)   . Sleep apnea    CPAP  . Thyroid disease 2002  . Type II or unspecified type diabetes mellitus without mention of complication, not stated as uncontrolled   . Unspecified gastritis and gastroduodenitis without mention of hemorrhage     Current Medications: Current Meds  Medication Sig  . aspirin  81 MG chewable tablet Chew 81 mg by mouth daily.  Marland Kitchen atorvastatin (LIPITOR) 20 MG tablet Take 1 tablet (20 mg total) by mouth daily.  . calcium citrate (CALCITRATE - DOSED IN MG ELEMENTAL CALCIUM) 950 (200 Ca) MG tablet Take by mouth.  . calcium-vitamin D (OSCAL WITH D) 500-200 MG-UNIT per tablet Take 1 tablet by mouth 2 (two) times daily.  . Cholecalciferol (VITAMIN D3) 50 MCG (2000 UT) capsule Take 2,000 Units by mouth every other day.  . Coenzyme Q10 (CO Q-10) 100 MG CAPS Take 100 mg by mouth daily.  . cyanocobalamin (,VITAMIN B-12,) 1000 MCG/ML injection INJECT 1 ML INTO MUSCLE ONCE A MONTH  . doxepin (SINEQUAN) 10 MG capsule Take by mouth.  . DULoxetine (CYMBALTA) 60 MG capsule Take 60 mg by mouth daily.  Marland Kitchen  EPINEPHrine 0.3 mg/0.3 mL IJ SOAJ injection Frequency:PHARMDIR   Dosage:0.0     Instructions:  Note:self inject in the event of allergic reaction, then proceed immediately to the nearest Emergency Room. Dose: 1 INJECTION  . fentaNYL (DURAGESIC - DOSED MCG/HR) 25 MCG/HR patch Place 25 mcg onto the skin every 3 (three) days.  . fexofenadine (ALLEGRA) 30 MG tablet Take 30 mg by mouth daily.  . fluticasone (FLONASE) 50 MCG/ACT nasal spray Place 2 sprays into the nose daily.  . furosemide (LASIX) 40 MG tablet Take 1 tablet (40 mg total) by mouth daily.  Marland Kitchen gabapentin (NEURONTIN) 300 MG capsule Take 300 mg by mouth at bedtime.  Marland Kitchen gentamicin (GARAMYCIN) 0.3 % ophthalmic solution 1 drop 3 (three) times daily.  Marland Kitchen glipiZIDE (GLUCOTROL) 10 MG tablet Take 10 mg by mouth daily before breakfast.  . glyBURIDE (DIABETA) 5 MG tablet Take 5 mg by mouth daily.  . Insulin Glargine-Lixisenatide (SOLIQUA) 100-33 UNT-MCG/ML SOPN Inject 20 Units into the skin.  Marland Kitchen levothyroxine (SYNTHROID) 100 MCG tablet Take 100 mcg by mouth daily.  Marland Kitchen levothyroxine (SYNTHROID, LEVOTHROID) 125 MCG tablet Take 1 tablet by mouth daily.  Marland Kitchen lidocaine (LIDODERM) 5 % Place 1 patch onto the skin daily. Remove & Discard patch within 12 hours or as directed by MD  . Rolan Lipa 290 MCG CAPS capsule TAKE 1 CAPSULE BY MOUTH EVERY DAY  . Melatonin 10 MG TABS Take by mouth.  . metFORMIN (GLUCOPHAGE) 1000 MG tablet Take 1,000 mg by mouth 2 (two) times daily with a meal.  . metoprolol succinate (TOPROL-XL) 50 MG 24 hr tablet TAKE 1 TABLET BY MOUTH EVERY DAY  . midodrine (PROAMATINE) 2.5 MG tablet Take 2.5 mg by mouth 3 (three) times daily.  . montelukast (SINGULAIR) 10 MG tablet Take 10 mg by mouth at bedtime.  . naproxen sodium (ALEVE) 220 MG tablet Take 220 mg by mouth.  . Needles & Syringes MISC 25G 5/8 inch needle on 3 ml syringe-Use once monthly for B12 injections  . nitroGLYCERIN (NITROSTAT) 0.4 MG SL tablet PLACE 1 TABLET (0.4 MG TOTAL) UNDER THE  TONGUE EVERY 5 (FIVE) MINUTES AS NEEDED FOR CHEST PAIN.  Marland Kitchen NOVOLOG FLEXPEN 100 UNIT/ML FlexPen Inject into the skin.  Marland Kitchen nystatin (MYCOSTATIN) 100000 UNIT/ML suspension SMARTSIG:3 By Mouth 10 Times Daily PRN  . Omega-3 Fatty Acids (FISH OIL PO) Take by mouth.  . ondansetron (ZOFRAN) 4 MG tablet Take 1 tablet (4 mg total) by mouth every 8 (eight) hours as needed for nausea or vomiting.  Glory Rosebush VERIO test strip 1 each by Other route as directed.   . pantoprazole (PROTONIX) 40 MG tablet TAKE 1 TABLET (40 MG TOTAL) BY MOUTH  2 (TWO) TIMES DAILY.  Marland Kitchen Plecanatide (TRULANCE) 3 MG TABS Take 1 tablet by mouth daily.  . potassium chloride (KLOR-CON) 10 MEQ tablet Take 1 tablet (10 mEq total) by mouth 2 (two) times daily.  . ramipril (ALTACE) 5 MG capsule Take 1 capsule (5 mg total) by mouth at bedtime.  . senna-docusate (SENOKOT-S) 8.6-50 MG tablet Take by mouth.  . traMADol (ULTRAM) 50 MG tablet Take by mouth.   Current Facility-Administered Medications for the 07/04/20 encounter (Office Visit) with Richardson Dopp T, PA-C  Medication  . 0.9 %  sodium chloride infusion  . 0.9 %  sodium chloride infusion     Allergies:   Codeine and Sulfonamide derivatives   Social History   Tobacco Use  . Smoking status: Never Smoker  . Smokeless tobacco: Never Used  Vaping Use  . Vaping Use: Never used  Substance Use Topics  . Alcohol use: No  . Drug use: No     Family Hx: The patient's family history includes Cancer in her father and mother; Colon cancer in her maternal uncle; Colon polyps in her maternal uncle; Diabetes in her father, maternal aunt, maternal uncle, and mother; Esophageal cancer in her maternal uncle; Heart disease in her father and paternal grandmother; Hyperlipidemia in her father; Hypertension in her father and mother; Lung cancer in her father and maternal uncle; Ovarian cancer in her maternal aunt; Sarcoidosis in her mother; Urolithiasis in her father.  Review of Systems    Constitutional: Positive for malaise/fatigue.  Respiratory: Negative for cough.   Skin: Positive for dry skin.  Gastrointestinal: Positive for constipation. Negative for hematochezia and melena.     EKGs/Labs/Other Test Reviewed:    EKG:  EKG is   ordered today.  The ekg ordered today demonstrates increased artifact, normal sinus rhythm, heart rate 75, normal axis, no acute ST-T wave changes, QTC 446  Recent Labs: 09/08/2019: BUN 16; Creatinine, Ser 0.87; Potassium 4.8; Sodium 141   Labs from [place] (personally reviewed/interpreted): 06/24/20: TSH 23.437 04/21/20: ALT 11,  03/25/20: Hgb 13 02/19/20: K 3.1, Cr 0.93  Recent Lipid Panel Lab Results  Component Value Date/Time   CHOL 123 01/20/2013 04:26 PM   TRIG 255.0 (H) 01/20/2013 04:26 PM   HDL 41.10 01/20/2013 04:26 PM   CHOLHDL 3 01/20/2013 04:26 PM   LDLCALC 41 12/28/2008 11:40 AM   LDLDIRECT 46.2 01/20/2013 04:26 PM    Physical Exam:    VS:  BP 130/60   Pulse 75   Ht 5\' 5"  (1.651 m)   Wt 176 lb 9.6 oz (80.1 kg)   SpO2 95%   BMI 29.39 kg/m     Wt Readings from Last 3 Encounters:  07/04/20 176 lb 9.6 oz (80.1 kg)  08/13/19 181 lb 1.9 oz (82.2 kg)  08/05/19 182 lb (82.6 kg)     Constitutional:      Appearance: Healthy appearance. Not in distress.  Neck:     Vascular: JVD normal.  Pulmonary:     Effort: Pulmonary effort is normal.     Breath sounds: No wheezing. No rales.  Cardiovascular:     Normal rate. Regular rhythm. Normal S1. Normal S2.     Murmurs: There is no murmur.  Edema:    Pretibial: bilateral 1+ edema of the pretibial area.    Ankle: bilateral 2+ edema of the ankle.    Feet: bilateral 2+ edema of the feet. Abdominal:     General: There is no distension.     Palpations:  Abdomen is soft. There is no hepatomegaly.  Skin:    General: Skin is warm and dry.  Neurological:     Mental Status: Alert and oriented to person, place and time.     Cranial Nerves: Cranial nerves are intact.       ASSESSMENT & PLAN:    1. Leg swelling 2. Hypothyroidism, unspecified type Her recent TSH was 23.437.  She has not really heard back from endocrinologist about adjustments in her levothyroxine.  We will try to touch base with her endocrinologist's office to arrange follow-up.  I suspect her swelling is all related to her uncontrolled hypothyroidism.  I doubt she has a DVT but given the acute onset of her swelling, I will arrange bilateral venous Dopplers to rule out DVT.  Her neck veins are flat and her lungs are clear.  I doubt she is volume overloaded from diastolic dysfunction.  Echocardiogram in 08/2019 demonstrated normal LV function with mild diastolic dysfunction.  I will obtain a follow-up BMET, BNP, CBC today.  If her BNP is significantly elevated, I will adjust her furosemide and obtain a follow-up echocardiogram.  3. Coronary artery disease involving native coronary artery of native heart with other form of angina pectoris ([place]) She had mild plaque by cardiac catheterization in 2006.  Myoview in 2017 was low risk.  She has chronic chest pain without significant change.  Continue aspirin, atorvastatin.  4. Bilateral extracranial carotid artery stenosis Followed by vascular surgery.  She remains on atorvastatin, aspirin.  5. Essential hypertension The patient's blood pressure is controlled on her current regimen.  Continue current therapy.     Dispo:  Return in about 3 weeks (around 07/25/2020) for Close Follow Up, w/ Dr. Burt Knack, or Richardson Dopp, PA-C, in person.   Medication Adjustments/Labs and Tests Ordered: Current medicines are reviewed at length with the patient today.  Concerns regarding medicines are outlined above.  Tests Ordered: Orders Placed This Encounter  Procedures  . Basic metabolic panel  . CBC  . Pro b natriuretic peptide (BNP)  . EKG 12-Lead  . VAS [place] LOWER EXTREMITY ARTERIAL DUPLEX   Medication Changes: No orders of the defined types were placed in this  encounter.   Signed, Richardson Dopp, PA-C  07/04/2020 3:15 PM    [facility] [place], [place], [place]  [address] Phone: [phone]; Fax: [phone]

## 2020-07-04 NOTE — Telephone Encounter (Signed)
The patient was seen in clinic today.

## 2020-07-04 NOTE — Telephone Encounter (Signed)
Left message to call back  

## 2020-07-04 NOTE — Telephone Encounter (Signed)
     I went in pt's chart to see if anybody called her .

## 2020-07-04 NOTE — Telephone Encounter (Signed)
Patient requesting same day appointment.  OV slot with Kathleen Argue 2:15 saved for patient Awaiting her return call LMTCB on cell number Unable to LM on home phone at this time Will try again shortly

## 2020-07-04 NOTE — Telephone Encounter (Signed)
Pt c/o swelling: STAT is pt has developed SOB within 24 hours  1) How much weight have you gained and in what time span?  Thinks she might have gainesd weight, but have not weighted herself  2) If swelling, where is the swelling located?  Swollen a lot from her knee on down to her feet  3) Are you currently taking a fluid pill? yes  4) Are you currently SOB? Yes-but not while she is sitting at this time  5) Do you have a log of your daily weights (if so, list)?  6) Have you gained 3 pounds in a day or 5 pounds in a week?  7) Have you traveled recently?no- pt would like to be seen today by somebody please

## 2020-07-04 NOTE — Patient Instructions (Signed)
Medication Instructions:  Your physician recommends that you continue on your current medications as directed. Please refer to the Current Medication list given to you today.  *If you need a refill on your cardiac medications before your next appointment, please call your pharmacy*  Lab Work: You will have labs drawn today: BMET/BNP/CBC  Testing/Procedures: Your physician would like you to have a bilateral lower extremity ultrasound  Follow-Up: At [facility], you and your health needs are our priority.  As part of our continuing mission to provide you with exceptional heart care, we have created designated Provider Care Teams.  These Care Teams include your primary Cardiologist (physician) and Advanced Practice Providers (APPs -  Physician Assistants and Nurse Practitioners) who all work together to provide you with the care you need, when you need it.  Your next appointment:   2-3 week(s)  The format for your next appointment:   In Person  Provider:   You may see Sherren Mocha, MD or Richardson Dopp, PA-C  Other Instructions Follow up with endocrinologist

## 2020-07-05 LAB — CBC
Hematocrit: 29.6 % — ABNORMAL LOW (ref 34.0–46.6)
Hemoglobin: 8.9 g/dL — ABNORMAL LOW (ref 11.1–15.9)
MCH: 21.9 pg — ABNORMAL LOW (ref 26.6–33.0)
MCHC: 30.1 g/dL — ABNORMAL LOW (ref 31.5–35.7)
MCV: 73 fL — ABNORMAL LOW (ref 79–97)
Platelets: 640 10*3/uL — ABNORMAL HIGH (ref 150–450)
RBC: 4.07 x10E6/uL (ref 3.77–5.28)
RDW: 16.3 % — ABNORMAL HIGH (ref 11.7–15.4)
WBC: 16.1 10*3/uL — ABNORMAL HIGH (ref 3.4–10.8)

## 2020-07-05 LAB — BASIC METABOLIC PANEL
BUN/Creatinine Ratio: 17 (ref 12–28)
BUN: 18 mg/dL (ref 8–27)
CO2: 29 mmol/L (ref 20–29)
Calcium: 9 mg/dL (ref 8.7–10.3)
Chloride: 90 mmol/L — ABNORMAL LOW (ref 96–106)
Creatinine, Ser: 1.07 mg/dL — ABNORMAL HIGH (ref 0.57–1.00)
GFR calc Af Amer: 58 mL/min/{1.73_m2} — ABNORMAL LOW (ref >59)
GFR calc non Af Amer: 50 mL/min/{1.73_m2} — ABNORMAL LOW (ref >59)
Glucose: 172 mg/dL — ABNORMAL HIGH (ref 65–99)
Potassium: 3.9 mmol/L (ref 3.5–5.2)
Sodium: 136 mmol/L (ref 134–144)

## 2020-07-05 LAB — PRO B NATRIURETIC PEPTIDE: NT-Pro BNP: 165 pg/mL (ref 0–738)

## 2020-07-06 ENCOUNTER — Telehealth: Payer: Self-pay | Admitting: Physician Assistant

## 2020-07-06 ENCOUNTER — Telehealth: Payer: Self-pay

## 2020-07-06 DIAGNOSIS — I10 Essential (primary) hypertension: Secondary | ICD-10-CM

## 2020-07-06 DIAGNOSIS — R0602 Shortness of breath: Secondary | ICD-10-CM

## 2020-07-06 DIAGNOSIS — M7989 Other specified soft tissue disorders: Secondary | ICD-10-CM

## 2020-07-06 MED ORDER — FUROSEMIDE 40 MG PO TABS: 40.0000 mg | ORAL_TABLET | ORAL | 3 refills | Status: DC

## 2020-07-06 MED ORDER — POTASSIUM CHLORIDE CRYS ER 10 MEQ PO TBCR: 20.0000 meq | EXTENDED_RELEASE_TABLET | ORAL | 3 refills | Status: DC

## 2020-07-06 NOTE — Telephone Encounter (Signed)
I returned call to patient and provided her the [employer] phone number so she can find a primary care doctor in the area that takes her insurance. Patient thanked me for the call.

## 2020-07-06 NOTE — Telephone Encounter (Signed)
The patient has been notified of the result and verbalized understanding.  All questions (if any) were answered. Patient will change Furosemide and Potassium to three times a week on Monday/Wednesday/Friday. Prescriptions updated and sent to pharmacy. Patient will come back on 07/13/20 for repeat BMET; orders in. Patient aware that she needs to contact PCP today for anemia; copy of labs faxed to PCP. Patient also states that Synthroid has been increased to 137 mcg per endocrinologist for hypothyroidism. Patient verbalized understanding of labs and thanked me for the call. [name], [place] 07/06/2020 10:12 AM

## 2020-07-06 NOTE — Telephone Encounter (Signed)
Patient does not have a PCP, she is wondering if we can recommend someone to her.

## 2020-07-06 NOTE — Telephone Encounter (Signed)
-----   Message from [name], [place] sent at 07/05/2020  5:39 PM EDT ----- Creatinine fairly stable.  Potassium normal.  Chloride somewhat low.  This may indicate she is getting somewhat dehydrated from taking furosemide.  Hemoglobin is low.  BNP is normal. PLAN:  -Change furosemide and potassium to 3 times a week (Monday, Wednesday, Friday) -Hold furosemide and potassium dose on 07/06/2020 -She needs urgent follow-up with primary care for anemia -Her MCV is low which indicates iron deficiency; this may indicate ongoing bleeding -Send labs to PCP -Make sure she knows to follow-up with endocrinology for uncontrolled hypothyroidism -Repeat BMET 1 week Richardson Dopp, PA-C    07/05/2020 5:32 PM

## 2020-07-08 ENCOUNTER — Other Ambulatory Visit: Payer: Self-pay | Admitting: Cardiovascular Disease

## 2020-07-13 ENCOUNTER — Other Ambulatory Visit: Payer: Medicare HMO

## 2020-07-14 ENCOUNTER — Other Ambulatory Visit: Payer: Self-pay

## 2020-07-14 ENCOUNTER — Other Ambulatory Visit: Payer: Medicare HMO

## 2020-07-14 DIAGNOSIS — I10 Essential (primary) hypertension: Secondary | ICD-10-CM

## 2020-07-14 DIAGNOSIS — R0602 Shortness of breath: Secondary | ICD-10-CM

## 2020-07-14 DIAGNOSIS — M7989 Other specified soft tissue disorders: Secondary | ICD-10-CM

## 2020-07-15 LAB — BASIC METABOLIC PANEL
BUN/Creatinine Ratio: 21 (ref 12–28)
BUN: 19 mg/dL (ref 8–27)
CO2: 28 mmol/L (ref 20–29)
Calcium: 9.2 mg/dL (ref 8.7–10.3)
Chloride: 93 mmol/L — ABNORMAL LOW (ref 96–106)
Creatinine, Ser: 0.89 mg/dL (ref 0.57–1.00)
GFR calc Af Amer: 72 mL/min/{1.73_m2} (ref >59)
GFR calc non Af Amer: 63 mL/min/{1.73_m2} (ref >59)
Glucose: 267 mg/dL — ABNORMAL HIGH (ref 65–99)
Potassium: 4.3 mmol/L (ref 3.5–5.2)
Sodium: 136 mmol/L (ref 134–144)

## 2020-07-19 ENCOUNTER — Ambulatory Visit (HOSPITAL_COMMUNITY)
Admission: RE | Admit: 2020-07-19 | Payer: Medicare HMO | Source: Ambulatory Visit | Attending: Physician Assistant | Admitting: Physician Assistant

## 2020-07-20 ENCOUNTER — Encounter (HOSPITAL_COMMUNITY): Payer: Medicare HMO

## 2020-07-22 ENCOUNTER — Other Ambulatory Visit: Payer: Self-pay | Admitting: Gastroenterology

## 2020-07-26 NOTE — Progress Notes (Deleted)
Cardiology Office Note:    Date:  07/26/2020   ID:  Jeanne Kim, DOB 1941/06/11, MRN [id number]  PCP:  Neale Burly, MD  [facility] Cardiologist:  Sherren Mocha, MD  [place] Electrophysiologist:  None   Referring MD: Neale Burly, MD   Chief Complaint:  *** No chief complaint on file.    Patient Profile:    Jeanne Kim is a 79 y.o. female with:   Coronary artery disease  Cath in 2006 with mild nonobstructive disease   Myoview 4/17: low risk  Leg swelling  Breast CA  Carotid artery disease  [place] 07/2019: L 40-59; R 1-39  Followed by [facility]  GERD  Fibromyalgia  IBS  Hypertension  Hyperlipidemia  Sleep apnea  Diabetes mellitus  Hypothyroidism  L hip fx in 4/21; s/p L THR 5/21  Prior CV studies: Cardiac monitor 10/2019 NSR, average HR 71; occasional short supraventricular runs; no sustained arrhythmias, bradycardic events, heart block or atrial fibrillation  Echocardiogram 08/21/2019 EF 60-65, borderline LVH, GR 1 DD, no RWMA, normal RVSF, trivial pericardial effusion, trivial TR, mild AI, mild to moderate aortic valve sclerosis (no aortic stenosis), RVSP 31.1  Carotid US 08/05/2019  R 1-39; L 40-59  Myoview 01/10/2016 EF 65, no ischemia or infarction; low risk  Cardiac catheterization 10/17/04 LM normal LAD mid 30-40 LCx normal RCA normal  Normal LVF  History of Present Illness:    Jeanne Kim was last seen 07/04/20 for symptoms of leg swelling.  Her TSH was recently noted to be 23.  Carotid dopplers were ordered but never done.  NT-pro BNP was normal.  Her Creatinine was slightly higher and I had her cut back on furosemide.  Her Hgb was lower at 8.9 with and MVC of 73.  I suspected her edema was related to uncontrolled hypothyroidism in the setting of microcytic anemia.  I asked her to follow up with her endocrinologist and her PCP.  She returns for follow up  ***      Past Medical History:  Diagnosis Date  . Allergy   .  Anemia   . Anginal pain ([place])   . Anxiety   . Arthritis   . Blood transfusion without reported diagnosis   . Breast cancer ([place])   . Carotid artery plaque    bilat  . Cataract   . Colon polyps    adenomatous  . Concussion 02/2016   Driving restrictions for 6w  . Coronary heart disease   . Depression   . Diverticulosis   . Dizziness   . Esophageal reflux   . Esophageal stricture   . Fibromyalgia   . Gallstones   . Hiatal hernia   . Hypercholesterolemia   . Hypertension   . Hypothyroidism   . IBS (irritable bowel syndrome)   . Kidney stones   . Osteoporosis   . Pneumonia    recently, just finished ABO  . Pneumonia   . PONV (postoperative nausea and vomiting)   . Sleep apnea    CPAP  . Thyroid disease 2002  . Type II or unspecified type diabetes mellitus without mention of complication, not stated as uncontrolled   . Unspecified gastritis and gastroduodenitis without mention of hemorrhage     Current Medications: No outpatient medications have been marked as taking for the 07/27/20 encounter (Appointment) with Richardson Dopp T, PA-C.   Current Facility-Administered Medications for the 07/27/20 encounter (Appointment) with [name], PA-C  Medication  . 0.9 %  sodium  chloride infusion  . 0.9 %  sodium chloride infusion     Allergies:   Codeine and Sulfonamide derivatives   Social History   Tobacco Use  . Smoking status: Never Smoker  . Smokeless tobacco: Never Used  Vaping Use  . Vaping Use: Never used  Substance Use Topics  . Alcohol use: No  . Drug use: No     Family Hx: The patient's family history includes Cancer in her father and mother; Colon cancer in her maternal uncle; Colon polyps in her maternal uncle; Diabetes in her father, maternal aunt, maternal uncle, and mother; Esophageal cancer in her maternal uncle; Heart disease in her father and paternal grandmother; Hyperlipidemia in her father; Hypertension in her father and mother; Lung cancer  in her father and maternal uncle; Ovarian cancer in her maternal aunt; Sarcoidosis in her mother; Urolithiasis in her father.  ROS  See HPI  EKGs/Labs/Other Test Reviewed:    EKG:  EKG is ***  ordered today.  The ekg ordered today demonstrates ***  Recent Labs: 07/04/2020: Hemoglobin 8.9; NT-Pro BNP 165; Platelets 640 07/14/2020: BUN 19; Creatinine, Ser 0.89; Potassium 4.3; Sodium 136   Labs from [place] (personally reviewed/interpreted): 06/24/20: TSH 23.437 04/21/20: ALT 11,  03/25/20: Hgb 13 02/19/20: K 3.1, Cr 0.93  Recent Lipid Panel Lab Results  Component Value Date/Time   CHOL 123 01/20/2013 04:26 PM   TRIG 255.0 (H) 01/20/2013 04:26 PM   HDL 41.10 01/20/2013 04:26 PM   CHOLHDL 3 01/20/2013 04:26 PM   LDLCALC 41 12/28/2008 11:40 AM   LDLDIRECT 46.2 01/20/2013 04:26 PM    Physical Exam:    VS:  There were no vitals taken for this visit.    Wt Readings from Last 3 Encounters:  07/04/20 176 lb 9.6 oz (80.1 kg)  08/13/19 181 lb 1.9 oz (82.2 kg)  08/05/19 182 lb (82.6 kg)     Physical Exam ***  ASSESSMENT & PLAN:    ***   1. Leg swelling 2. Hypothyroidism, unspecified type Her recent TSH was 23.437.  She has not really heard back from endocrinologist about adjustments in her levothyroxine.  We will try to touch base with her endocrinologist's office to arrange follow-up.  I suspect her swelling is all related to her uncontrolled hypothyroidism.  I doubt she has a DVT but given the acute onset of her swelling, I will arrange bilateral venous Dopplers to rule out DVT.  Her neck veins are flat and her lungs are clear.  I doubt she is volume overloaded from diastolic dysfunction.  Echocardiogram in 08/2019 demonstrated normal LV function with mild diastolic dysfunction.  I will obtain a follow-up BMET, BNP, CBC today.  If her BNP is significantly elevated, I will adjust her furosemide and obtain a follow-up echocardiogram.  3. Coronary artery disease involving  native coronary artery of native heart with other form of angina pectoris ([place]) She had mild plaque by cardiac catheterization in 2006.  Myoview in 2017 was low risk.  She has chronic chest pain without significant change.  Continue aspirin, atorvastatin.  4. Bilateral extracranial carotid artery stenosis Followed by vascular surgery.  She remains on atorvastatin, aspirin.  5. Essential hypertension The patient's blood pressure is controlled on her current regimen.  Continue current therapy.     Dispo:  No follow-ups on file.   Medication Adjustments/Labs and Tests Ordered: Current medicines are reviewed at length with the patient today.  Concerns regarding medicines are outlined above.  Tests Ordered: No orders  of the defined types were placed in this encounter.  Medication Changes: No orders of the defined types were placed in this encounter.   Signed, Richardson Dopp, PA-C  07/26/2020 5:04 PM    [facility] [place], [place], [place]  [address] Phone: [phone]; Fax: [phone]

## 2020-07-27 ENCOUNTER — Ambulatory Visit: Payer: Medicare HMO | Admitting: Physician Assistant

## 2020-07-27 DIAGNOSIS — I25118 Atherosclerotic heart disease of native coronary artery with other forms of angina pectoris: Secondary | ICD-10-CM

## 2020-07-27 DIAGNOSIS — M7989 Other specified soft tissue disorders: Secondary | ICD-10-CM

## 2020-07-27 DIAGNOSIS — E039 Hypothyroidism, unspecified: Secondary | ICD-10-CM

## 2020-07-27 DIAGNOSIS — D509 Iron deficiency anemia, unspecified: Secondary | ICD-10-CM

## 2020-07-27 DIAGNOSIS — I10 Essential (primary) hypertension: Secondary | ICD-10-CM

## 2020-08-03 ENCOUNTER — Inpatient Hospital Stay (HOSPITAL_COMMUNITY): Admission: RE | Admit: 2020-08-03 | Payer: Medicare HMO | Source: Ambulatory Visit

## 2020-08-05 ENCOUNTER — Emergency Department (HOSPITAL_COMMUNITY): Payer: Medicare HMO

## 2020-08-05 ENCOUNTER — Other Ambulatory Visit: Payer: Self-pay

## 2020-08-05 ENCOUNTER — Ambulatory Visit
Admission: EM | Admit: 2020-08-05 | Discharge: 2020-08-05 | Disposition: A | Discharge status: Home / Self Care | Payer: Medicare HMO

## 2020-08-05 ENCOUNTER — Inpatient Hospital Stay (HOSPITAL_COMMUNITY)
Admission: EM | Admit: 2020-08-05 | Discharge: 2020-08-07 | DRG: 378 | Disposition: A | Discharge status: Home / Self Care | Payer: Medicare HMO | Attending: Family Medicine | Admitting: Family Medicine

## 2020-08-05 ENCOUNTER — Encounter (HOSPITAL_COMMUNITY): Payer: Self-pay

## 2020-08-05 ENCOUNTER — Inpatient Hospital Stay (HOSPITAL_COMMUNITY): Payer: Medicare HMO

## 2020-08-05 DIAGNOSIS — E876 Hypokalemia: Secondary | ICD-10-CM | POA: Diagnosis present

## 2020-08-05 DIAGNOSIS — M7989 Other specified soft tissue disorders: Secondary | ICD-10-CM | POA: Diagnosis present

## 2020-08-05 DIAGNOSIS — I2510 Atherosclerotic heart disease of native coronary artery without angina pectoris: Secondary | ICD-10-CM | POA: Diagnosis present

## 2020-08-05 DIAGNOSIS — K589 Irritable bowel syndrome without diarrhea: Secondary | ICD-10-CM | POA: Diagnosis present

## 2020-08-05 DIAGNOSIS — K259 Gastric ulcer, unspecified as acute or chronic, without hemorrhage or perforation: Secondary | ICD-10-CM | POA: Diagnosis not present

## 2020-08-05 DIAGNOSIS — Z8371 Family history of colonic polyps: Secondary | ICD-10-CM

## 2020-08-05 DIAGNOSIS — G4733 Obstructive sleep apnea (adult) (pediatric): Secondary | ICD-10-CM | POA: Diagnosis present

## 2020-08-05 DIAGNOSIS — Z882 Allergy status to sulfonamides status: Secondary | ICD-10-CM | POA: Diagnosis not present

## 2020-08-05 DIAGNOSIS — I10 Essential (primary) hypertension: Secondary | ICD-10-CM | POA: Diagnosis present

## 2020-08-05 DIAGNOSIS — Z794 Long term (current) use of insulin: Secondary | ICD-10-CM

## 2020-08-05 DIAGNOSIS — K254 Chronic or unspecified gastric ulcer with hemorrhage: Principal | ICD-10-CM | POA: Diagnosis present

## 2020-08-05 DIAGNOSIS — Z20822 Contact with and (suspected) exposure to covid-19: Secondary | ICD-10-CM | POA: Diagnosis present

## 2020-08-05 DIAGNOSIS — K449 Diaphragmatic hernia without obstruction or gangrene: Secondary | ICD-10-CM | POA: Diagnosis present

## 2020-08-05 DIAGNOSIS — E039 Hypothyroidism, unspecified: Secondary | ICD-10-CM | POA: Diagnosis not present

## 2020-08-05 DIAGNOSIS — E785 Hyperlipidemia, unspecified: Secondary | ICD-10-CM | POA: Diagnosis present

## 2020-08-05 DIAGNOSIS — K219 Gastro-esophageal reflux disease without esophagitis: Secondary | ICD-10-CM | POA: Diagnosis present

## 2020-08-05 DIAGNOSIS — K5730 Diverticulosis of large intestine without perforation or abscess without bleeding: Secondary | ICD-10-CM | POA: Diagnosis present

## 2020-08-05 DIAGNOSIS — G894 Chronic pain syndrome: Secondary | ICD-10-CM | POA: Diagnosis present

## 2020-08-05 DIAGNOSIS — M797 Fibromyalgia: Secondary | ICD-10-CM | POA: Diagnosis present

## 2020-08-05 DIAGNOSIS — D649 Anemia, unspecified: Secondary | ICD-10-CM

## 2020-08-05 DIAGNOSIS — I951 Orthostatic hypotension: Secondary | ICD-10-CM | POA: Diagnosis present

## 2020-08-05 DIAGNOSIS — Z9049 Acquired absence of other specified parts of digestive tract: Secondary | ICD-10-CM | POA: Diagnosis not present

## 2020-08-05 DIAGNOSIS — Z853 Personal history of malignant neoplasm of breast: Secondary | ICD-10-CM

## 2020-08-05 DIAGNOSIS — Z885 Allergy status to narcotic agent status: Secondary | ICD-10-CM | POA: Diagnosis not present

## 2020-08-05 DIAGNOSIS — Z83438 Family history of other disorder of lipoprotein metabolism and other lipidemia: Secondary | ICD-10-CM

## 2020-08-05 DIAGNOSIS — E119 Type 2 diabetes mellitus without complications: Secondary | ICD-10-CM | POA: Diagnosis present

## 2020-08-05 DIAGNOSIS — Z981 Arthrodesis status: Secondary | ICD-10-CM

## 2020-08-05 DIAGNOSIS — I2583 Coronary atherosclerosis due to lipid rich plaque: Secondary | ICD-10-CM | POA: Diagnosis not present

## 2020-08-05 DIAGNOSIS — Z87440 Personal history of urinary (tract) infections: Secondary | ICD-10-CM

## 2020-08-05 DIAGNOSIS — Z8041 Family history of malignant neoplasm of ovary: Secondary | ICD-10-CM

## 2020-08-05 DIAGNOSIS — E890 Postprocedural hypothyroidism: Secondary | ICD-10-CM | POA: Diagnosis present

## 2020-08-05 DIAGNOSIS — E7800 Pure hypercholesterolemia, unspecified: Secondary | ICD-10-CM | POA: Diagnosis present

## 2020-08-05 DIAGNOSIS — T501X6A Underdosing of loop [high-ceiling] diuretics, initial encounter: Secondary | ICD-10-CM | POA: Diagnosis present

## 2020-08-05 DIAGNOSIS — Z833 Family history of diabetes mellitus: Secondary | ICD-10-CM

## 2020-08-05 DIAGNOSIS — Z79890 Hormone replacement therapy (postmenopausal): Secondary | ICD-10-CM

## 2020-08-05 DIAGNOSIS — Z801 Family history of malignant neoplasm of trachea, bronchus and lung: Secondary | ICD-10-CM

## 2020-08-05 DIAGNOSIS — K2100 Gastro-esophageal reflux disease with esophagitis, without bleeding: Secondary | ICD-10-CM | POA: Diagnosis present

## 2020-08-05 DIAGNOSIS — E1165 Type 2 diabetes mellitus with hyperglycemia: Secondary | ICD-10-CM | POA: Diagnosis not present

## 2020-08-05 DIAGNOSIS — Z79899 Other long term (current) drug therapy: Secondary | ICD-10-CM

## 2020-08-05 DIAGNOSIS — K921 Melena: Secondary | ICD-10-CM

## 2020-08-05 DIAGNOSIS — D62 Acute posthemorrhagic anemia: Secondary | ICD-10-CM | POA: Diagnosis present

## 2020-08-05 DIAGNOSIS — E871 Hypo-osmolality and hyponatremia: Secondary | ICD-10-CM | POA: Diagnosis present

## 2020-08-05 DIAGNOSIS — Z86010 Personal history of colonic polyps: Secondary | ICD-10-CM

## 2020-08-05 DIAGNOSIS — Z8249 Family history of ischemic heart disease and other diseases of the circulatory system: Secondary | ICD-10-CM

## 2020-08-05 DIAGNOSIS — K922 Gastrointestinal hemorrhage, unspecified: Secondary | ICD-10-CM | POA: Diagnosis present

## 2020-08-05 DIAGNOSIS — Z7984 Long term (current) use of oral hypoglycemic drugs: Secondary | ICD-10-CM

## 2020-08-05 DIAGNOSIS — Z91138 Patient's unintentional underdosing of medication regimen for other reason: Secondary | ICD-10-CM

## 2020-08-05 DIAGNOSIS — Z7982 Long term (current) use of aspirin: Secondary | ICD-10-CM

## 2020-08-05 DIAGNOSIS — Z800 Family history of malignant neoplasm of digestive organs: Secondary | ICD-10-CM

## 2020-08-05 DIAGNOSIS — R531 Weakness: Secondary | ICD-10-CM

## 2020-08-05 LAB — CBC WITH DIFFERENTIAL/PLATELET
Abs Immature Granulocytes: 0.07 10*3/uL (ref 0.00–0.07)
Basophils Absolute: 0.1 10*3/uL (ref 0.0–0.1)
Basophils Relative: 1 %
Eosinophils Absolute: 1.2 10*3/uL — ABNORMAL HIGH (ref 0.0–0.5)
Eosinophils Relative: 10 %
HCT: 24.1 % — ABNORMAL LOW (ref 36.0–46.0)
Hemoglobin: 7.1 g/dL — ABNORMAL LOW (ref 12.0–15.0)
Immature Granulocytes: 1 %
Lymphocytes Relative: 22 %
Lymphs Abs: 2.8 10*3/uL (ref 0.7–4.0)
MCH: 22.8 pg — ABNORMAL LOW (ref 26.0–34.0)
MCHC: 29.5 g/dL — ABNORMAL LOW (ref 30.0–36.0)
MCV: 77.5 fL — ABNORMAL LOW (ref 80.0–100.0)
Monocytes Absolute: 2 10*3/uL — ABNORMAL HIGH (ref 0.1–1.0)
Monocytes Relative: 16 %
Neutro Abs: 6.4 10*3/uL (ref 1.7–7.7)
Neutrophils Relative %: 50 %
Platelets: 468 10*3/uL — ABNORMAL HIGH (ref 150–400)
RBC: 3.11 MIL/uL — ABNORMAL LOW (ref 3.87–5.11)
RDW: 18.6 % — ABNORMAL HIGH (ref 11.5–15.5)
WBC: 12.6 10*3/uL — ABNORMAL HIGH (ref 4.0–10.5)
nRBC: 0 % (ref 0.0–0.2)

## 2020-08-05 LAB — COMPREHENSIVE METABOLIC PANEL
ALT: 13 U/L (ref 0–44)
AST: 18 U/L (ref 15–41)
Albumin: 3.7 g/dL (ref 3.5–5.0)
Alkaline Phosphatase: 52 U/L (ref 38–126)
Anion gap: 8 (ref 5–15)
BUN: 22 mg/dL (ref 8–23)
CO2: 30 mmol/L (ref 22–32)
Calcium: 8.8 mg/dL — ABNORMAL LOW (ref 8.9–10.3)
Chloride: 92 mmol/L — ABNORMAL LOW (ref 98–111)
Creatinine, Ser: 1.02 mg/dL — ABNORMAL HIGH (ref 0.44–1.00)
GFR, Estimated: 57 mL/min — ABNORMAL LOW (ref >60)
Glucose, Bld: 87 mg/dL (ref 70–99)
Potassium: 3.3 mmol/L — ABNORMAL LOW (ref 3.5–5.1)
Sodium: 130 mmol/L — ABNORMAL LOW (ref 135–145)
Total Bilirubin: 0.6 mg/dL (ref 0.3–1.2)
Total Protein: 7 g/dL (ref 6.5–8.1)

## 2020-08-05 LAB — PROTIME-INR
INR: 1 (ref 0.8–1.2)
Prothrombin Time: 12.8 seconds (ref 11.4–15.2)

## 2020-08-05 LAB — PREPARE RBC (CROSSMATCH)

## 2020-08-05 LAB — RESP PANEL BY RT PCR (RSV, FLU A&B, COVID)
Influenza A by PCR: NEGATIVE
Influenza B by PCR: NEGATIVE
Respiratory Syncytial Virus by PCR: NEGATIVE
SARS Coronavirus 2 by RT PCR: NEGATIVE

## 2020-08-05 LAB — POC OCCULT BLOOD, ED: Fecal Occult Bld: POSITIVE — AB

## 2020-08-05 LAB — ABO/RH: ABO/RH(D): O NEG

## 2020-08-05 LAB — LIPASE, BLOOD: Lipase: 16 U/L (ref 11–51)

## 2020-08-05 LAB — BRAIN NATRIURETIC PEPTIDE: B Natriuretic Peptide: 63 pg/mL (ref 0.0–100.0)

## 2020-08-05 MED ORDER — POTASSIUM CHLORIDE IN NACL 40-0.9 MEQ/L-% IV SOLN
INTRAVENOUS | Status: DC
  Filled 2020-08-05 (×2): qty 1000

## 2020-08-05 MED ORDER — POTASSIUM CHLORIDE 10 MEQ/100ML IV SOLN: 10.0000 meq | INTRAVENOUS | Status: DC

## 2020-08-05 MED ORDER — SODIUM CHLORIDE 0.9 % IV SOLN: 10.0000 mL/h | Freq: Once | INTRAVENOUS | Status: DC

## 2020-08-05 MED ORDER — LEVOTHYROXINE SODIUM 137 MCG PO TABS
137.0000 ug | ORAL_TABLET | Freq: Every day | ORAL | Status: DC
  Administered 2020-08-06 – 2020-08-07 (×2): 137 ug via ORAL
  Filled 2020-08-05 (×3): qty 1

## 2020-08-05 MED ORDER — DOXEPIN HCL 10 MG PO CAPS
10.0000 mg | ORAL_CAPSULE | Freq: Every day | ORAL | Status: DC
  Administered 2020-08-06 (×2): 10 mg via ORAL
  Filled 2020-08-05 (×5): qty 1

## 2020-08-05 MED ORDER — SODIUM CHLORIDE 0.9 % IV SOLN: 80.0000 mg | Freq: Once | INTRAVENOUS | Status: DC

## 2020-08-05 MED ORDER — MONTELUKAST SODIUM 10 MG PO TABS
10.0000 mg | ORAL_TABLET | Freq: Every day | ORAL | Status: DC
  Administered 2020-08-06 (×2): 10 mg via ORAL
  Filled 2020-08-05 (×2): qty 1

## 2020-08-05 MED ORDER — INSULIN ASPART 100 UNIT/ML ~~LOC~~ SOLN
0.0000 [IU] | Freq: Every day | SUBCUTANEOUS | Status: DC
  Administered 2020-08-06: 3 [IU] via SUBCUTANEOUS

## 2020-08-05 MED ORDER — MIDODRINE HCL 5 MG PO TABS
2.5000 mg | ORAL_TABLET | Freq: Three times a day (TID) | ORAL | Status: DC
  Administered 2020-08-06 – 2020-08-07 (×3): 2.5 mg via ORAL
  Filled 2020-08-05 (×12): qty 1

## 2020-08-05 MED ORDER — PANTOPRAZOLE SODIUM 40 MG IV SOLR: 40.0000 mg | Freq: Two times a day (BID) | INTRAVENOUS | Status: DC

## 2020-08-05 MED ORDER — SODIUM CHLORIDE 0.9 % IV SOLN
8.0000 mg/h | INTRAVENOUS | Status: DC
  Administered 2020-08-06: 8 mg/h via INTRAVENOUS
  Filled 2020-08-05 (×7): qty 80

## 2020-08-05 MED ORDER — TOBRAMYCIN 0.3 % OP SOLN
1.0000 [drp] | Freq: Four times a day (QID) | OPHTHALMIC | Status: DC
  Administered 2020-08-06 – 2020-08-07 (×7): 1 [drp] via OPHTHALMIC
  Filled 2020-08-05: qty 5

## 2020-08-05 MED ORDER — ACETAMINOPHEN 325 MG PO TABS: 650.0000 mg | ORAL_TABLET | Freq: Four times a day (QID) | ORAL | Status: DC | PRN

## 2020-08-05 MED ORDER — GABAPENTIN 300 MG PO CAPS
300.0000 mg | ORAL_CAPSULE | Freq: Every day | ORAL | Status: DC
  Administered 2020-08-06 (×2): 300 mg via ORAL
  Filled 2020-08-05 (×2): qty 1

## 2020-08-05 MED ORDER — METOPROLOL SUCCINATE ER 50 MG PO TB24
50.0000 mg | ORAL_TABLET | Freq: Every day | ORAL | Status: DC
  Administered 2020-08-06 – 2020-08-07 (×2): 50 mg via ORAL
  Filled 2020-08-05 (×3): qty 1

## 2020-08-05 MED ORDER — INSULIN ASPART 100 UNIT/ML ~~LOC~~ SOLN
0.0000 [IU] | Freq: Three times a day (TID) | SUBCUTANEOUS | Status: DC
  Administered 2020-08-06: 3 [IU] via SUBCUTANEOUS
  Administered 2020-08-06: 1 [IU] via SUBCUTANEOUS
  Administered 2020-08-07: 2 [IU] via SUBCUTANEOUS
  Administered 2020-08-07: 3 [IU] via SUBCUTANEOUS

## 2020-08-05 MED ORDER — INSULIN GLARGINE 100 UNIT/ML ~~LOC~~ SOLN
15.0000 [IU] | Freq: Every day | SUBCUTANEOUS | Status: DC
  Administered 2020-08-06 – 2020-08-07 (×2): 15 [IU] via SUBCUTANEOUS
  Filled 2020-08-05 (×4): qty 0.15

## 2020-08-05 MED ORDER — ACETAMINOPHEN 650 MG RE SUPP: 650.0000 mg | Freq: Four times a day (QID) | RECTAL | Status: DC | PRN

## 2020-08-05 MED ORDER — IOHEXOL 300 MG/ML  SOLN
100.0000 mL | Freq: Once | INTRAMUSCULAR | Status: AC | PRN
  Administered 2020-08-06: 100 mL via INTRAVENOUS

## 2020-08-05 MED ORDER — SODIUM CHLORIDE 0.9 % IV SOLN
80.0000 mg | Freq: Once | INTRAVENOUS | Status: AC
  Administered 2020-08-06: 80 mg via INTRAVENOUS
  Filled 2020-08-05: qty 80

## 2020-08-05 NOTE — ED Provider Notes (Signed)
[facility] Provider Note   CSN: [id number] Arrival date & time: 08/05/20  1657     History Chief Complaint  Patient presents with  . Melena    Jeanne Kim is a 79 y.o. female.  HPI    Patient presents with concern of black stool and weakness. Patient has multiple medical issues, including IBS. She notes inconsistent frequency of bowel movements, but no history of black stool. Twice over the past 3 days including most recently yesterday the patient had a black tarry stool. She has had intermittent left lower quadrant abdominal pain over the past week or so, though none currently.  No vomiting, nausea, anorexia.  No fever. Notably, patient is also currently receiving therapy for urinary tract infection.  This started 1 week ago with dysuria, frequency.  She has been taking Keflex since that time. Patient also complains of swelling in her legs bilaterally, distally.  She notes that she is on Lasix, but has not been taking it regularly due to her urinary tract infection. Since onset with the black tarry stool, no clear alleviating or exacerbating factors.  Takes aspirin daily, but no other blood thinning medication.   Past Medical History:  Diagnosis Date  . Allergy   . Anemia   . Anginal pain ([place])   . Anxiety   . Arthritis   . Blood transfusion without reported diagnosis   . Breast cancer ([place])   . Carotid artery plaque    bilat  . Cataract   . Colon polyps    adenomatous  . Concussion 02/2016   Driving restrictions for 6w  . Coronary heart disease   . Depression   . Diverticulosis   . Dizziness   . Esophageal reflux   . Esophageal stricture   . Fibromyalgia   . Gallstones   . Hiatal hernia   . Hypercholesterolemia   . Hypertension   . Hypothyroidism   . IBS (irritable bowel syndrome)   . Kidney stones   . Osteoporosis   . Pneumonia    recently, just finished ABO  . Pneumonia   . PONV (postoperative nausea and vomiting)   . Sleep  apnea    CPAP  . Thyroid disease 2002  . Type II or unspecified type diabetes mellitus without mention of complication, not stated as uncontrolled   . Unspecified gastritis and gastroduodenitis without mention of hemorrhage     Patient Active Problem List   Diagnosis Date Noted  . Fecal incontinence   . Swelling of limb-Bilateral leg 04/29/2014  . Weakness of both legs 04/29/2014  . Dizziness and giddiness 04/29/2014  . Malignant neoplasm of breast ([place]) 07/02/2013  . Occlusion and stenosis of carotid artery without mention of cerebral infarction 04/28/2013  . Carotid stenosis 01/20/2013  . Autonomic orthostatic hypotension 10/17/2012  . Dyspnea 08/27/2012  . Syncope 02/17/2012  . Head trauma 02/14/2012  . Diaphoresis 02/05/2012  . Unspecified constipation 12/12/2011  . Personal history of colonic polyps 12/12/2011  . Family history of malignant neoplasm of gastrointestinal tract 12/12/2011  . Diverticulosis of colon (without mention of hemorrhage) 12/12/2011  . Gastritis 12/12/2011  . GERD with stricture 12/12/2011  . Esophageal reflux 11/27/2011  . Constipation 11/27/2011  . IBS (irritable bowel syndrome) 11/27/2011  . Bacterial overgrowth syndrome 11/27/2011  . DM (diabetes mellitus) ([place]) 11/27/2011  . S/P cholecystectomy 11/27/2011  . Adult hypothyroidism 07/13/2011  . Type 2 diabetes mellitus ([place]) 07/13/2011  . ADENOCARCINOMA, LEFT BREAST 12/09/2009  . CHEST PAIN-PRECORDIAL 12/09/2009  .  UNSPECIFIED HYPOTHYROIDISM 04/27/2009  . DIABETES MELLITUS 04/07/2009  . HYPERCHOLESTEROLEMIA  IIA 04/07/2009  . OVERWEIGHT/OBESITY 04/07/2009  . Obstructive sleep apnea 04/07/2009  . HYPERTENSION, UNSPECIFIED 04/07/2009  . CAD, UNSPECIFIED SITE 04/07/2009  . IRRITABLE BOWEL SYNDROME 04/07/2009  . DEGENERATIVE JOINT DISEASE 04/07/2009  . DEGENERATIVE DISC DISEASE 04/07/2009  . FIBROMYALGIA 04/07/2009  . Thyroid nodule 05/04/2008    Past Surgical History:  Procedure  Laterality Date  . ANAL RECTAL MANOMETRY N/A 08/05/2015   Procedure: ANO RECTAL MANOMETRY;  Surgeon: Mauri Pole, MD;  Location: [facility];  Service: Endoscopy;  Laterality: N/A;  . APPENDECTOMY    . BREAST LUMPECTOMY     left  . CARDIAC CATHETERIZATION    . CATARACT EXTRACTION W/PHACO Left 03/30/2013   Procedure: CATARACT EXTRACTION PHACO AND INTRAOCULAR LENS PLACEMENT (IOC);  Surgeon: Tonny Branch, MD;  Location: [facility];  Service: Ophthalmology;  Laterality: Left;  CDE:19.28  . CATARACT EXTRACTION W/PHACO Right 04/09/2013   Procedure: CATARACT EXTRACTION PHACO AND INTRAOCULAR LENS PLACEMENT (IOC);  Surgeon: Tonny Branch, MD;  Location: [facility];  Service: Ophthalmology;  Laterality: Right;  CDE: 15.44  . CERVICAL FUSION    . CESAREAN SECTION    . CHOLECYSTECTOMY    . CYSTOCELE REPAIR    . DILATION AND CURETTAGE OF UTERUS    . EYE SURGERY Left 03-30-13   Cataract  . EYE SURGERY Right 04-09-13   Cataract  . RECTOCELE REPAIR    . THYROIDECTOMY    . TONSILLECTOMY    . TOTAL ABDOMINAL HYSTERECTOMY       OB History   No obstetric history on file.     Family History  Problem Relation Age of Onset  . Sarcoidosis Mother   . Diabetes Mother   . Cancer Mother        sarcoma -  Right arm amputation  . Hypertension Mother   . Lung cancer Father   . Diabetes Father   . Heart disease Father        Before age 64  . Urolithiasis Father   . Cancer Father        Lung  . Hyperlipidemia Father   . Hypertension Father   . Esophageal cancer Maternal Uncle   . Ovarian cancer Maternal Aunt   . Colon cancer Maternal Uncle   . Lung cancer Maternal Uncle   . Diabetes Maternal Uncle   . Diabetes Maternal Aunt   . Colon polyps Maternal Uncle   . Heart disease Paternal Grandmother     Social History   Tobacco Use  . Smoking status: Never Smoker  . Smokeless tobacco: Never Used  Vaping Use  . Vaping Use: Never used  Substance Use Topics  . Alcohol use: No  . Drug use: No    Home  Medications Prior to Admission medications   Medication Sig Start Date End Date Taking? Authorizing Provider  aspirin 81 MG chewable tablet Chew 81 mg by mouth daily.    [provider]  atorvastatin (LIPITOR) 20 MG tablet Take 1 tablet (20 mg total) by mouth daily. 10/17/12   Hillary Bow, MD  calcium citrate (CALCITRATE - DOSED IN MG ELEMENTAL CALCIUM) 950 (200 Ca) MG tablet Take by mouth. 02/12/20   [provider]  calcium-vitamin D (OSCAL WITH D) 500-200 MG-UNIT per tablet Take 1 tablet by mouth 2 (two) times daily.    [provider]  Cholecalciferol (VITAMIN D3) 50 MCG (2000 UT) capsule Take 2,000 Units by mouth every other  day. 04/09/20   [provider]  Coenzyme Q10 (CO Q-10) 100 MG CAPS Take 100 mg by mouth daily.    [provider]  cyanocobalamin (,VITAMIN B-12,) 1000 MCG/ML injection INJECT 1 ML INTO MUSCLE ONCE A MONTH 04/27/19   Mauri Pole, MD  doxepin (SINEQUAN) 10 MG capsule Take by mouth. 02/16/19   [provider]  DULoxetine (CYMBALTA) 60 MG capsule Take 60 mg by mouth daily.    [provider]  EPINEPHrine 0.3 mg/0.3 mL IJ SOAJ injection Frequency:PHARMDIR   Dosage:0.0     Instructions:  Note:self inject in the event of allergic reaction, then proceed immediately to the nearest Emergency Room. Dose: 1 INJECTION 07/18/09   [provider]  fentaNYL (DURAGESIC - DOSED MCG/HR) 25 MCG/HR patch Place 25 mcg onto the skin every 3 (three) days.    [provider]  fexofenadine (ALLEGRA) 30 MG tablet Take 30 mg by mouth daily.    [provider]  fluticasone (FLONASE) 50 MCG/ACT nasal spray Place 2 sprays into the nose daily.    [provider]  furosemide (LASIX) 40 MG tablet Take 1 tablet (40 mg total) by mouth 3 (three) times a week. Take on Monday/Wednesday/Friday 07/06/20   Richardson Dopp T, PA-C  gabapentin (NEURONTIN) 300 MG capsule Take 300 mg by mouth at bedtime.     [provider]  gentamicin (GARAMYCIN) 0.3 % ophthalmic solution 1 drop 3 (three) times daily. 06/16/20   [provider]  glipiZIDE (GLUCOTROL) 10 MG tablet Take 10 mg by mouth daily before breakfast.    [provider]  glyBURIDE (DIABETA) 5 MG tablet Take 5 mg by mouth daily. 02/12/20   [provider]  Insulin Glargine-Lixisenatide (SOLIQUA) 100-33 UNT-MCG/ML SOPN Inject 20 Units into the skin.    [provider]  levothyroxine (SYNTHROID) 100 MCG tablet Take 100 mcg by mouth daily. 06/04/20   [provider]  levothyroxine (SYNTHROID, LEVOTHROID) 125 MCG tablet Take 1 tablet by mouth daily. 07/06/15   [provider]  lidocaine (LIDODERM) 5 % Place 1 patch onto the skin daily. Remove & Discard patch within 12 hours or as directed by MD    [provider]  LINZESS 290 MCG CAPS capsule TAKE 1 CAPSULE BY MOUTH EVERY DAY 04/12/20   Mauri Pole, MD  Melatonin 10 MG TABS Take by mouth.    [provider]  metFORMIN (GLUCOPHAGE) 1000 MG tablet Take 1,000 mg by mouth 2 (two) times daily with a meal.    [provider]  metoprolol succinate (TOPROL-XL) 50 MG 24 hr tablet TAKE 1 TABLET BY MOUTH EVERY DAY 07/08/20   Sherren Mocha, MD  midodrine (PROAMATINE) 2.5 MG tablet Take 2.5 mg by mouth 3 (three) times daily. 08/02/14   [provider]  montelukast (SINGULAIR) 10 MG tablet Take 10 mg by mouth at bedtime.    [provider]  naproxen sodium (ALEVE) 220 MG tablet Take 220 mg by mouth.    [provider]  Needles & Syringes MISC 25G 5/8 inch needle on 3 ml syringe-Use once monthly for B12 injections 07/20/15   Nandigam, Karleen Hampshire V, MD  nitroGLYCERIN (NITROSTAT) 0.4 MG SL tablet PLACE 1 TABLET (0.4 MG TOTAL) UNDER THE TONGUE EVERY 5 (FIVE) MINUTES AS NEEDED FOR CHEST PAIN. 12/03/19   Sherren Mocha, MD  NOVOLOG FLEXPEN 100 UNIT/ML FlexPen Inject into the skin. 06/20/20   [provider]  nystatin (MYCOSTATIN) 100000 UNIT/ML suspension SMARTSIG:3 By Mouth  10 Times Daily PRN 06/11/20   [provider]  Omega-3 Fatty Acids (FISH OIL PO) Take by mouth. 07/18/09   [provider]  ondansetron (ZOFRAN) 4 MG tablet Take 1 tablet (4 mg total) by mouth every 8 (eight) hours as needed for nausea or vomiting. 01/30/17   Mauri Pole, MD  [facility] VERIO test strip 1 each by Other route as directed.  04/18/16   [provider]  pantoprazole (PROTONIX) 40 MG tablet TAKE 1 TABLET (40 MG TOTAL) BY MOUTH 2 (TWO) TIMES DAILY. 07/25/20   Nandigam, Venia Minks, MD  Plecanatide (TRULANCE) 3 MG TABS Take 1 tablet by mouth daily. 08/25/19   Mauri Pole, MD  potassium chloride (KLOR-CON) 10 MEQ tablet Take 2 tablets (20 mEq total) by mouth 3 (three) times a week. 07/06/20   Richardson Dopp T, PA-C  ramipril (ALTACE) 5 MG capsule Take 1 capsule (5 mg total) by mouth at bedtime. 01/11/16   Sherren Mocha, MD  senna-docusate (SENOKOT-S) 8.6-50 MG tablet Take by mouth. 03/25/20   [provider]  traMADol (ULTRAM) 50 MG tablet Take by mouth. 03/25/20   [provider]    Allergies    Codeine and Sulfonamide derivatives  Review of Systems   Review of Systems  Constitutional:       Per HPI, otherwise negative  HENT:       Per HPI, otherwise negative  Respiratory:       Per HPI, otherwise negative  Cardiovascular:       Per HPI, otherwise negative  Gastrointestinal: Positive for abdominal pain. Negative for vomiting.  Endocrine:       Negative aside from HPI  Genitourinary:       Neg aside from HPI   Musculoskeletal:       Per HPI, otherwise negative  Skin: Negative.   Neurological: Positive for weakness. Negative for syncope.    Physical Exam Updated Vital Signs BP (!) 142/64 (BP Location: Right Arm)   Pulse 74   Temp 98.2 F (36.8 C) (Oral)   Resp 16   Ht 5\' 4"  (1.626 m)   Wt 77.1 kg   SpO2 100%   BMI 29.18 kg/m    Physical Exam Vitals and nursing note reviewed.  Constitutional:      General: She is not in acute distress.    Appearance: She is well-developed.  HENT:     Head: Normocephalic and atraumatic.  Eyes:     Conjunctiva/sclera: Conjunctivae normal.  Cardiovascular:     Rate and Rhythm: Normal rate and regular rhythm.  Pulmonary:     Effort: Pulmonary effort is normal. No respiratory distress.     Breath sounds: Normal breath sounds. No stridor.  Abdominal:     General: There is no distension.     Tenderness: There is abdominal tenderness.  Genitourinary:    Rectum: Guaiac result positive.     Comments: External hemorrhoids Musculoskeletal:     Right lower leg: Edema present.     Left lower leg: Edema present.  Skin:    General: Skin is warm and dry.  Neurological:     Mental Status: She is alert and oriented to person, place, and time.     Cranial Nerves: No cranial nerve deficit.      ED Results / Procedures / Treatments   Labs (all labs ordered are listed, but only abnormal results are displayed) Labs Reviewed  COMPREHENSIVE METABOLIC PANEL - Abnormal; Notable for the following components:  Result Value   Sodium 130 (*)    Potassium 3.3 (*)    Chloride 92 (*)    Creatinine, Ser 1.02 (*)    Calcium 8.8 (*)    GFR, Estimated 57 (*)    All other components within normal limits  CBC WITH DIFFERENTIAL/PLATELET - Abnormal; Notable for the following components:   WBC 12.6 (*)    RBC 3.11 (*)    Hemoglobin 7.1 (*)    HCT 24.1 (*)    MCV 77.5 (*)    MCH 22.8 (*)    MCHC 29.5 (*)    RDW 18.6 (*)    Platelets 468 (*)    Monocytes Absolute 2.0 (*)    Eosinophils Absolute 1.2 (*)    All other components within normal limits  POC OCCULT BLOOD, ED - Abnormal; Notable for the following components:   Fecal Occult Bld POSITIVE (*)    All other components within normal limits  RESP PANEL BY RT PCR (RSV, FLU A&B, COVID)  LIPASE, BLOOD  PROTIME-INR  BRAIN NATRIURETIC  PEPTIDE  TYPE AND SCREEN  ABO/RH  PREPARE RBC (CROSSMATCH)     Radiology DG Chest Port 1 View  Result Date: 08/05/2020 CLINICAL DATA:  Weakness. EXAM: PORTABLE CHEST 1 VIEW COMPARISON:  02/03/2020 rib/chest radiograph and prior. FINDINGS: Mild hypoinflation. No focal consolidation, pneumothorax or pleural effusion. Cardiomediastinal silhouette is unchanged. Aortic atherosclerotic calcifications. Sequela of ACDF. IMPRESSION: No focal airspace disease. Electronically Signed   By: Primitivo Gauze M.D.   On: 08/05/2020 18:15    Procedures Procedures (including critical care time)  Medications Ordered in ED Medications - No data to display  ED Course  I have reviewed the triage vital signs and the nursing notes.  Pertinent labs & imaging results that were available during my care of the patient were reviewed by me and considered in my medical decision making (see chart for details).     Update:, Repeat exam the patient is in similar condition. Heart rate below 100, pressure with a systolic greater than 536. We discussed findings thus far including most notable abnormality of hemoglobin value of 7.1, substantially down from baseline greater than 10.  On completion of physical exam, with Pearlie Oyster present, Hemoccult result was positive.  Patient has previously had type and screen, will start transfusion.  I d/w GI, Dr. Abbey Chatters.  Patient will be seen in the AM.  In the ED, protonix, IVF, NPO after midnight.   Elderly female presents with new weakness, after several episodes of melena.  Here she is awake and alert, though has minimal hypotension. Patient found to have critically abnormal hemoglobin value requiring initiation of transfusion, discussion with GI and internal medicine, and initiation of Protonix as well.   MDM Rules/Calculators/A&P  MDM Number of Diagnoses or Management Options Melena: new, needed workup Symptomatic anemia: new, needed workup Weakness: new, needed  workup   Amount and/or Complexity of Data Reviewed Clinical lab tests: reviewed Tests in the radiology section of CPT: reviewed Tests in the medicine section of CPT: reviewed Decide to obtain previous medical records or to obtain history from someone other than the patient: yes Review and summarize past medical records: yes Discuss the patient with other providers: yes Independent visualization of images, tracings, or specimens: yes  Risk of Complications, Morbidity, and/or Mortality Presenting problems: high Diagnostic procedures: high Management options: high  Critical Care Total time providing critical care: 30-74 minutes (35)  Patient Progress Patient progress: stable   Final Clinical Impression(s) / ED  Diagnoses Final diagnoses:  Weakness  Symptomatic anemia  Melena      Carmin Muskrat, MD 08/05/20 2042

## 2020-08-05 NOTE — ED Triage Notes (Signed)
Pt reports she is on antibiotics for UTI since Monday and now has black stools for the past 2 days. Pt states she is tired. States pain in left lower abdomen, but does not hurt now

## 2020-08-05 NOTE — ED Notes (Signed)
Patient is being discharged from the Urgent Care and sent to the Emergency Department via pov . Per B. Wurst, patient is in need of higher level of care due to black stools. Patient is aware and verbalizes understanding of plan of care.  Vitals:   08/05/20 1634  BP: (!) 152/57  Pulse: 76  Resp: 16  Temp: 98.2 F (36.8 C)  SpO2: 96%

## 2020-08-05 NOTE — H&P (Signed)
TRH H&P   Patient Demographics:    Jeanne Kim, is a 79 y.o. female  MRN: [id number]   DOB - 05-10-1941  Admit Date - 08/05/2020  Outpatient Primary MD for the patient is Neale Burly, MD  Referring MD/NP/PA: Dr Vanita Panda  Outpatient Specialists: GI Dr. Silverio Decamp from [facility]  Patient coming from: Home  Chief Complaint  Patient presents with  . Melena      HPI:    Jeanne Kim  is a 79 y.o. female, past medical history of hypertension, CAD, hypothyroidism, breast cancer, fibromyalgia, irritable bowel syndrome, hypothyroidism, diabetes mellitus ED secondary to melena, patient with history of irritable bowel syndrome, most recent endoscopy 2017 with evidence of gastric ulcer, colonoscopy 2018 with internal hemorrhoids and diverticulosis, patient presents to ED secondary to melena over last 3 days, where she had 2 episodes, no bowel movement today, as well she does report left lower quadrant pain over the past week or so, she denies any fever, chills, nausea, vomiting, no bright red blood per rectum, no coffee-ground emesis, as well patient report generalized weakness and poor appetite over last few days, she thought that might be related to UTI for which she has been taking Keflex for last 5 days, she denies any further dysuria or polyuria, but she did have some hematuria initially which currently resolved, she denies any chest pain, shortness of breath.  Patient reports she is on aspirin, and taking naproxen infrequently, she only took 2 tablets over last 10 days. -In ED hemoglobin was low at 7.1, she was Hemoccult positive, work-up significant for sodium of 130, potassium of 3.3, creatinine of 1.02, BNP of 63, she was ordered 1 unit PRBC in ED and [facility] consulted to admit.    Review of systems:    In addition to the HPI above,  No Fever-chills, generalized weakness  and fatigue No Headache, No changes with Vision or hearing, No problems swallowing food or Liquids, No Chest pain, Cough or Shortness of Breath, Ports abdominal pain,, No Nausea or Vommitting, Bowel movements are regular, Reports some hematuria initially with UTI, currently resolved, reports melena x2 No dysuria, No new skin rashes or bruises, No new joints pains-aches,  No new weakness, tingling, numbness in any extremity, No recent weight gain or loss, No polyuria, polydypsia or polyphagia, No significant Mental Stressors.  A full 10 point Review of Systems was done, except as stated above, all other Review of Systems were negative.   With Past History of the following :    Past Medical History:  Diagnosis Date  . Allergy   . Anemia   . Anginal pain ([place])   . Anxiety   . Arthritis   . Blood transfusion without reported diagnosis   . Breast cancer ([place])   . Carotid artery plaque    bilat  . Cataract   . Colon polyps  adenomatous  . Concussion 02/2016   Driving restrictions for 6w  . Coronary heart disease   . Depression   . Diverticulosis   . Dizziness   . Esophageal reflux   . Esophageal stricture   . Fibromyalgia   . Gallstones   . Hiatal hernia   . Hypercholesterolemia   . Hypertension   . Hypothyroidism   . IBS (irritable bowel syndrome)   . Kidney stones   . Osteoporosis   . Pneumonia    recently, just finished ABO  . Pneumonia   . PONV (postoperative nausea and vomiting)   . Sleep apnea    CPAP  . Thyroid disease 2002  . Type II or unspecified type diabetes mellitus without mention of complication, not stated as uncontrolled   . Unspecified gastritis and gastroduodenitis without mention of hemorrhage       Past Surgical History:  Procedure Laterality Date  . ANAL RECTAL MANOMETRY N/A 08/05/2015   Procedure: ANO RECTAL MANOMETRY;  Surgeon: Mauri Pole, MD;  Location: [facility];  Service: Endoscopy;  Laterality: N/A;  . APPENDECTOMY     . BREAST LUMPECTOMY     left  . CARDIAC CATHETERIZATION    . CATARACT EXTRACTION W/PHACO Left 03/30/2013   Procedure: CATARACT EXTRACTION PHACO AND INTRAOCULAR LENS PLACEMENT (IOC);  Surgeon: Tonny Branch, MD;  Location: [facility];  Service: Ophthalmology;  Laterality: Left;  CDE:19.28  . CATARACT EXTRACTION W/PHACO Right 04/09/2013   Procedure: CATARACT EXTRACTION PHACO AND INTRAOCULAR LENS PLACEMENT (IOC);  Surgeon: Tonny Branch, MD;  Location: [facility];  Service: Ophthalmology;  Laterality: Right;  CDE: 15.44  . CERVICAL FUSION    . CESAREAN SECTION    . CHOLECYSTECTOMY    . CYSTOCELE REPAIR    . DILATION AND CURETTAGE OF UTERUS    . EYE SURGERY Left 03-30-13   Cataract  . EYE SURGERY Right 04-09-13   Cataract  . RECTOCELE REPAIR    . THYROIDECTOMY    . TONSILLECTOMY    . TOTAL ABDOMINAL HYSTERECTOMY        Social History:     Social History   Tobacco Use  . Smoking status: Never Smoker  . Smokeless tobacco: Never Used  Substance Use Topics  . Alcohol use: No       Family History :     Family History  Problem Relation Age of Onset  . Sarcoidosis Mother   . Diabetes Mother   . Cancer Mother        sarcoma -  Right arm amputation  . Hypertension Mother   . Lung cancer Father   . Diabetes Father   . Heart disease Father        Before age 21  . Urolithiasis Father   . Cancer Father        Lung  . Hyperlipidemia Father   . Hypertension Father   . Esophageal cancer Maternal Uncle   . Ovarian cancer Maternal Aunt   . Colon cancer Maternal Uncle   . Lung cancer Maternal Uncle   . Diabetes Maternal Uncle   . Diabetes Maternal Aunt   . Colon polyps Maternal Uncle   . Heart disease Paternal Grandmother      Home Medications:   Prior to Admission medications   Medication Sig Start Date End Date Taking? Authorizing Provider  aspirin 81 MG chewable tablet Chew 81 mg by mouth daily.    [provider]  atorvastatin (LIPITOR) 20 MG tablet Take 1 tablet (20 mg  total) by mouth daily. 10/17/12   Hillary Bow, MD  calcium citrate (CALCITRATE - DOSED IN MG ELEMENTAL CALCIUM) 950 (200 Ca) MG tablet Take by mouth. 02/12/20   [provider]  calcium-vitamin D (OSCAL WITH D) 500-200 MG-UNIT per tablet Take 1 tablet by mouth 2 (two) times daily.    [provider]  Cholecalciferol (VITAMIN D3) 50 MCG (2000 UT) capsule Take 2,000 Units by mouth every other day. 04/09/20   [provider]  Coenzyme Q10 (CO Q-10) 100 MG CAPS Take 100 mg by mouth daily.    [provider]  cyanocobalamin (,VITAMIN B-12,) 1000 MCG/ML injection INJECT 1 ML INTO MUSCLE ONCE A MONTH 04/27/19   Mauri Pole, MD  doxepin (SINEQUAN) 10 MG capsule Take by mouth. 02/16/19   [provider]  DULoxetine (CYMBALTA) 60 MG capsule Take 60 mg by mouth daily.    [provider]  EPINEPHrine 0.3 mg/0.3 mL IJ SOAJ injection Frequency:PHARMDIR   Dosage:0.0     Instructions:  Note:self inject in the event of allergic reaction, then proceed immediately to the nearest Emergency Room. Dose: 1 INJECTION 07/18/09   [provider]  fentaNYL (DURAGESIC - DOSED MCG/HR) 25 MCG/HR patch Place 25 mcg onto the skin every 3 (three) days.    [provider]  fexofenadine (ALLEGRA) 30 MG tablet Take 30 mg by mouth daily.    [provider]  fluticasone (FLONASE) 50 MCG/ACT nasal spray Place 2 sprays into the nose daily.    [provider]  furosemide (LASIX) 40 MG tablet Take 1 tablet (40 mg total) by mouth 3 (three) times a week. Take on Monday/Wednesday/Friday 07/06/20   Richardson Dopp T, PA-C  gabapentin (NEURONTIN) 300 MG capsule Take 300 mg by mouth at bedtime.    [provider]  gentamicin (GARAMYCIN) 0.3 % ophthalmic solution 1 drop 3 (three) times daily. 06/16/20   [provider]  glipiZIDE (GLUCOTROL) 10 MG tablet Take 10 mg by mouth daily before breakfast.    [provider]  glyBURIDE  (DIABETA) 5 MG tablet Take 5 mg by mouth daily. 02/12/20   [provider]  Insulin Glargine-Lixisenatide (SOLIQUA) 100-33 UNT-MCG/ML SOPN Inject 20 Units into the skin.    [provider]  levothyroxine (SYNTHROID) 100 MCG tablet Take 100 mcg by mouth daily. 06/04/20   [provider]  levothyroxine (SYNTHROID, LEVOTHROID) 125 MCG tablet Take 1 tablet by mouth daily. 07/06/15   [provider]  lidocaine (LIDODERM) 5 % Place 1 patch onto the skin daily. Remove & Discard patch within 12 hours or as directed by MD    [provider]  LINZESS 290 MCG CAPS capsule TAKE 1 CAPSULE BY MOUTH EVERY DAY 04/12/20   Mauri Pole, MD  Melatonin 10 MG TABS Take by mouth.    [provider]  metFORMIN (GLUCOPHAGE) 1000 MG tablet Take 1,000 mg by mouth 2 (two) times daily with a meal.    [provider]  metoprolol succinate (TOPROL-XL) 50 MG 24 hr tablet TAKE 1 TABLET BY MOUTH EVERY DAY 07/08/20   Sherren Mocha, MD  midodrine (PROAMATINE) 2.5 MG tablet Take 2.5 mg by mouth 3 (three) times daily. 08/02/14   [provider]  montelukast (SINGULAIR) 10 MG tablet Take 10 mg by mouth at bedtime.    [provider]  naproxen sodium (ALEVE) 220 MG tablet Take 220 mg by mouth.    [provider]  Needles & Syringes MISC 25G  5/8 inch needle on 3 ml syringe-Use once monthly for B12 injections 07/20/15   Nandigam, Karleen Hampshire V, MD  nitroGLYCERIN (NITROSTAT) 0.4 MG SL tablet PLACE 1 TABLET (0.4 MG TOTAL) UNDER THE TONGUE EVERY 5 (FIVE) MINUTES AS NEEDED FOR CHEST PAIN. 12/03/19   Sherren Mocha, MD  NOVOLOG FLEXPEN 100 UNIT/ML FlexPen Inject into the skin. 06/20/20   [provider]  nystatin (MYCOSTATIN) 100000 UNIT/ML suspension SMARTSIG:3 By Mouth 10 Times Daily PRN 06/11/20   [provider]  Omega-3 Fatty Acids (FISH OIL PO) Take by mouth. 07/18/09   [provider]  ondansetron (ZOFRAN) 4 MG tablet Take 1  tablet (4 mg total) by mouth every 8 (eight) hours as needed for nausea or vomiting. 01/30/17   Mauri Pole, MD  [facility] VERIO test strip 1 each by Other route as directed.  04/18/16   [provider]  pantoprazole (PROTONIX) 40 MG tablet TAKE 1 TABLET (40 MG TOTAL) BY MOUTH 2 (TWO) TIMES DAILY. 07/25/20   Nandigam, Venia Minks, MD  Plecanatide (TRULANCE) 3 MG TABS Take 1 tablet by mouth daily. 08/25/19   Mauri Pole, MD  potassium chloride (KLOR-CON) 10 MEQ tablet Take 2 tablets (20 mEq total) by mouth 3 (three) times a week. 07/06/20   Richardson Dopp T, PA-C  ramipril (ALTACE) 5 MG capsule Take 1 capsule (5 mg total) by mouth at bedtime. 01/11/16   Sherren Mocha, MD  senna-docusate (SENOKOT-S) 8.6-50 MG tablet Take by mouth. 03/25/20   [provider]  traMADol (ULTRAM) 50 MG tablet Take by mouth. 03/25/20   [provider]     Allergies:     Allergies  Allergen Reactions  . Codeine Hives  . Sulfonamide Derivatives Hives     Physical Exam:   Vitals  Blood pressure 111/85, pulse 71, temperature 97.9 F (36.6 C), temperature source Oral, resp. rate 16, height 5\' 4"  (1.626 m), weight 77.1 kg, SpO2 100 %.   1. General well developed female , lying in bed in NAD,   2. Normal affect and insight, Not Suicidal or Homicidal, Awake Alert, Oriented X 3.  3. No F.N deficits, ALL C.Nerves Intact, Strength 5/5 all 4 extremities, Sensation intact all 4 extremities, Plantars down going.  4. Ears and Eyes appear Normal, Conjunctivae clear, PERRLA. Moist Oral Mucosa.  5. Supple Neck, No JVD, No cervical lymphadenopathy appriciated, No Carotid Bruits.  6. Symmetrical Chest wall movement, Good air movement bilaterally, CTAB.  7. RRR, No Gallops, Rubs or Murmurs, No Parasternal Heave.trace edema B/L.  8. Positive Bowel Sounds, Abdomen Soft, mild left lower quadrant tenderness, no organomegaly appriciated,No rebound -guarding or rigidity.  9.  No Cyanosis,  Normal Skin Turgor, No Skin Rash or Bruise.  10. Good muscle tone,  joints appear normal , no effusions, Normal ROM.  11. No Palpable Lymph Nodes in Neck or Axillae     Data Review:    CBC Recent Labs  Lab 08/05/20 1805  WBC 12.6*  HGB 7.1*  HCT 24.1*  PLT 468*  MCV 77.5*  MCH 22.8*  MCHC 29.5*  RDW 18.6*  LYMPHSABS 2.8  MONOABS 2.0*  EOSABS 1.2*  BASOSABS 0.1   ------------------------------------------------------------------------------------------------------------------  Chemistries  Recent Labs  Lab 08/05/20 1805  NA 130*  K 3.3*  CL 92*  CO2 30  GLUCOSE 87  BUN 22  CREATININE 1.02*  CALCIUM 8.8*  AST 18  ALT 13  ALKPHOS 52  BILITOT 0.6   ------------------------------------------------------------------------------------------------------------------ estimated creatinine clearance is 46.4 mL/min (  A) (by C-G formula based on SCr of 1.02 mg/dL (H)). ------------------------------------------------------------------------------------------------------------------ No results for input(s): TSH, T4TOTAL, T3FREE, THYROIDAB in the last 72 hours.  Invalid input(s): FREET3  Coagulation profile Recent Labs  Lab 08/05/20 1805  INR 1.0   ------------------------------------------------------------------------------------------------------------------- No results for input(s): DDIMER in the last 72 hours. -------------------------------------------------------------------------------------------------------------------  Cardiac Enzymes No results for input(s): CKMB, TROPONINI, MYOGLOBIN in the last 168 hours.  Invalid input(s): CK ------------------------------------------------------------------------------------------------------------------    Component Value Date/Time   BNP 63.0 08/05/2020 1806     ---------------------------------------------------------------------------------------------------------------  Urinalysis No results found for:  COLORURINE, APPEARANCEUR, LABSPEC, PHURINE, GLUCOSEU, HGBUR, BILIRUBINUR, KETONESUR, PROTEINUR, UROBILINOGEN, NITRITE, LEUKOCYTESUR  ----------------------------------------------------------------------------------------------------------------   Imaging Results:    DG Chest Port 1 View  Result Date: 08/05/2020 CLINICAL DATA:  Weakness. EXAM: PORTABLE CHEST 1 VIEW COMPARISON:  02/03/2020 rib/chest radiograph and prior. FINDINGS: Mild hypoinflation. No focal consolidation, pneumothorax or pleural effusion. Cardiomediastinal silhouette is unchanged. Aortic atherosclerotic calcifications. Sequela of ACDF. IMPRESSION: No focal airspace disease. Electronically Signed   By: Primitivo Gauze M.D.   On: 08/05/2020 18:15      Assessment & Plan:    Active Problems:   HYPERCHOLESTEROLEMIA  IIA   Essential hypertension   Coronary atherosclerosis   Esophageal reflux   IBS (irritable bowel syndrome)   DM (diabetes mellitus) ([place])   Diverticulosis of colon   Adult hypothyroidism   GI bleed   Symptomatic/acute blood loss anemia due to GI bleed -Presents with melena x2, IMA globin of 7.1, apparent(minimal use of NSAIDs, took only 2 tablets of naproxen over the last 10 days), history of gastric ulcer in 2017, history of diverticulosis and internal hemorrhoids and 2018. -This is most likely in the setting of upper GI bleed given his melena with no evidence of bright red blood per rectum. -We will keep on clear liquid diet, n.p.o. after midnight, she will be started on Protonix drip. -We will receive 1 unit PRBC, will check CBC after that, will continue to transfuse as needed. -Hold aspirin. -She is with left lower quadrant pain, will obtain CT abdomen pelvis with IV contrast.  History of CAD -Denies any chest pain, will hold aspirin, continue statin, beta-blockers  Hypertension -Blood pressure is soft, hold medications currently.  Fibromyalgia/chronic pain syndrome -Resume medication after  reconciliation if blood pressure allows  Irritable bowel syndrome -Resume Linzess  Diabetes mellitus -We will resume insulin glargine at a lower dose, will keep on sliding scale, meanwhile we will hold Metformin and glipizide -Does appear her most recent A1c 7.9 at her recent endocrinology visit  Hyperlipidemia -Continue with statin  Hypokalemia -Repleted with IV fluids  Hyponatremia -We will start on IV NS with KCL   Recent UTI -Just finished 4 days of oral Keflex, no need to resume antibiotic during hospital stay as she finished 3 days.  Hypothyroidism -Continue with Synthroid  DVT Prophylaxis  SCDs  AM Labs Ordered, also please review Full Orders  Family Communication: Admission, patients condition and plan of care including tests being ordered have been discussed with the patient and husband at bedside who indicate understanding and agree with the plan and Code Status.  Code Status Full  Likely DC to  home  Condition GUARDED    Consults called: GI by ED    Admission status: inpatient    Time spent in minutes : 60 minutes   Phillips Climes M.D on 08/05/2020 at 8:40 PM   [facility] - Office  [phone]

## 2020-08-05 NOTE — ED Triage Notes (Signed)
Pt reports she has a uti that she is currently on abx for, also has been having black stools the past couple of days.  States she feels very fatigued.

## 2020-08-06 ENCOUNTER — Telehealth: Payer: Self-pay | Admitting: Internal Medicine

## 2020-08-06 ENCOUNTER — Inpatient Hospital Stay (HOSPITAL_COMMUNITY): Payer: Medicare HMO | Admitting: Anesthesiology

## 2020-08-06 ENCOUNTER — Encounter (HOSPITAL_COMMUNITY)
Admission: EM | Disposition: A | Discharge status: Home / Self Care | Payer: Self-pay | Source: Home / Self Care | Attending: Family Medicine

## 2020-08-06 ENCOUNTER — Encounter (HOSPITAL_COMMUNITY): Payer: Self-pay | Admitting: Internal Medicine

## 2020-08-06 DIAGNOSIS — E1165 Type 2 diabetes mellitus with hyperglycemia: Secondary | ICD-10-CM

## 2020-08-06 DIAGNOSIS — K2100 Gastro-esophageal reflux disease with esophagitis, without bleeding: Secondary | ICD-10-CM

## 2020-08-06 DIAGNOSIS — I10 Essential (primary) hypertension: Secondary | ICD-10-CM

## 2020-08-06 DIAGNOSIS — K259 Gastric ulcer, unspecified as acute or chronic, without hemorrhage or perforation: Secondary | ICD-10-CM

## 2020-08-06 HISTORY — PX: ESOPHAGOGASTRODUODENOSCOPY (EGD) WITH PROPOFOL: SHX5813

## 2020-08-06 HISTORY — PX: BIOPSY: SHX5522

## 2020-08-06 LAB — BPAM RBC
Blood Product Expiration Date: 202111212359
ISSUE DATE / TIME: 202110292000
Unit Type and Rh: 9500

## 2020-08-06 LAB — CBC
HCT: 26.7 % — ABNORMAL LOW (ref 36.0–46.0)
HCT: 26.1 % — ABNORMAL LOW (ref 36.0–46.0)
Hemoglobin: 8 g/dL — ABNORMAL LOW (ref 12.0–15.0)
Hemoglobin: 7.8 g/dL — ABNORMAL LOW (ref 12.0–15.0)
MCH: 24 pg — ABNORMAL LOW (ref 26.0–34.0)
MCH: 24 pg — ABNORMAL LOW (ref 26.0–34.0)
MCHC: 30 g/dL (ref 30.0–36.0)
MCHC: 29.9 g/dL — ABNORMAL LOW (ref 30.0–36.0)
MCV: 79.9 fL — ABNORMAL LOW (ref 80.0–100.0)
MCV: 80.3 fL (ref 80.0–100.0)
Platelets: 399 10*3/uL (ref 150–400)
Platelets: 407 K/uL — ABNORMAL HIGH (ref 150–400)
RBC: 3.34 MIL/uL — ABNORMAL LOW (ref 3.87–5.11)
RBC: 3.25 MIL/uL — ABNORMAL LOW (ref 3.87–5.11)
RDW: 19 % — ABNORMAL HIGH (ref 11.5–15.5)
RDW: 19 % — ABNORMAL HIGH (ref 11.5–15.5)
WBC: 10.1 10*3/uL (ref 4.0–10.5)
WBC: 9.7 K/uL (ref 4.0–10.5)
nRBC: 0 % (ref 0.0–0.2)
nRBC: 0 % (ref 0.0–0.2)

## 2020-08-06 LAB — GLUCOSE, CAPILLARY
Glucose-Capillary: 77 mg/dL (ref 70–99)
Glucose-Capillary: 132 mg/dL — ABNORMAL HIGH (ref 70–99)
Glucose-Capillary: 239 mg/dL — ABNORMAL HIGH (ref 70–99)
Glucose-Capillary: 271 mg/dL — ABNORMAL HIGH (ref 70–99)

## 2020-08-06 LAB — TYPE AND SCREEN
ABO/RH(D): O NEG
Antibody Screen: NEGATIVE
Unit division: 0

## 2020-08-06 LAB — BASIC METABOLIC PANEL WITH GFR
Anion gap: 9 (ref 5–15)
BUN: 15 mg/dL (ref 8–23)
CO2: 28 mmol/L (ref 22–32)
Calcium: 8.4 mg/dL — ABNORMAL LOW (ref 8.9–10.3)
Chloride: 96 mmol/L — ABNORMAL LOW (ref 98–111)
Creatinine, Ser: 0.78 mg/dL (ref 0.44–1.00)
GFR, Estimated: >60 mL/min
Glucose, Bld: 172 mg/dL — ABNORMAL HIGH (ref 70–99)
Potassium: 3.1 mmol/L — ABNORMAL LOW (ref 3.5–5.1)
Sodium: 133 mmol/L — ABNORMAL LOW (ref 135–145)

## 2020-08-06 LAB — HEMOGLOBIN A1C
Hgb A1c MFr Bld: 7.4 % — ABNORMAL HIGH (ref 4.8–5.6)
Mean Plasma Glucose: 165.68 mg/dL

## 2020-08-06 SURGERY — ESOPHAGOGASTRODUODENOSCOPY (EGD) WITH PROPOFOL
Anesthesia: General

## 2020-08-06 MED ORDER — PROPOFOL 10 MG/ML IV BOLUS
INTRAVENOUS | Status: AC
  Filled 2020-08-06: qty 20

## 2020-08-06 MED ORDER — FENTANYL 25 MCG/HR TD PT72
1.0000 | MEDICATED_PATCH | TRANSDERMAL | Status: DC
  Administered 2020-08-06: 1 via TRANSDERMAL
  Filled 2020-08-06: qty 1

## 2020-08-06 MED ORDER — LIDOCAINE 2% (20 MG/ML) 5 ML SYRINGE
INTRAMUSCULAR | Status: AC
  Filled 2020-08-06: qty 5

## 2020-08-06 MED ORDER — PROPOFOL 10 MG/ML IV BOLUS
INTRAVENOUS | Status: DC | PRN
  Administered 2020-08-06: 100 mg via INTRAVENOUS

## 2020-08-06 MED ORDER — MELATONIN 3 MG PO TABS
9.0000 mg | ORAL_TABLET | Freq: Once | ORAL | Status: AC
  Administered 2020-08-06: 9 mg via ORAL
  Filled 2020-08-06: qty 3

## 2020-08-06 MED ORDER — MELATONIN 5 MG PO TABS
10.0000 mg | ORAL_TABLET | Freq: Once | ORAL | Status: DC
  Filled 2020-08-06: qty 2

## 2020-08-06 MED ORDER — POTASSIUM CHLORIDE CRYS ER 20 MEQ PO TBCR
40.0000 meq | EXTENDED_RELEASE_TABLET | ORAL | Status: AC
  Administered 2020-08-06 (×2): 40 meq via ORAL
  Filled 2020-08-06 (×2): qty 2

## 2020-08-06 MED ORDER — SODIUM CHLORIDE 0.9 % IV SOLN: INTRAVENOUS | Status: DC

## 2020-08-06 NOTE — Anesthesia Preprocedure Evaluation (Signed)
Anesthesia Evaluation  Patient identified by MRN, date of birth, ID band Patient awake    Reviewed: Allergy & Precautions, NPO status , Patient's Chart, lab work & pertinent test results, reviewed documented beta blocker date and time   History of Anesthesia Complications (+) PONV  Airway Mallampati: II  TM Distance: >3 FB Neck ROM: Full    Dental no notable dental hx.    Pulmonary sleep apnea ,    Pulmonary exam normal breath sounds clear to auscultation       Cardiovascular hypertension, + CAD  Normal cardiovascular exam Rhythm:Regular Rate:Normal     Neuro/Psych    GI/Hepatic hiatal hernia, GERD  ,  Endo/Other  diabetes, Type 2Hypothyroidism   Renal/GU      Musculoskeletal   Abdominal   Peds  Hematology  (+) anemia ,   Anesthesia Other Findings   Reproductive/Obstetrics                             Anesthesia Physical Anesthesia Plan  ASA: III  Anesthesia Plan: General   Post-op Pain Management:    Induction: Intravenous  PONV Risk Score and Plan:   Airway Management Planned:   Additional Equipment:   Intra-op Plan:   Post-operative Plan:   Informed Consent: I have reviewed the patients History and Physical, chart, labs and discussed the procedure including the risks, benefits and alternatives for the proposed anesthesia with the patient or authorized representative who has indicated his/her understanding and acceptance.     Dental advisory given  Plan Discussed with: CRNA  Anesthesia Plan Comments:         Anesthesia Quick Evaluation

## 2020-08-06 NOTE — H&P (View-Only) (Signed)
Consulting  Provider: Dr. Waldron Labs Primary Care Physician:  Neale Burly, MD  Reason for Consultation:  Melena, anemia   HPI:  Jeanne Kim is a 79 y.o. female with a past medical history of HTN, CAD, hypothyroidism, irritable bowel syndrome, diabetes, who was admitted to [facility] yesterday evening after initially presenting with generalized weakness/fatigue as well as melena.  Patient states she has had melena for the past 3 to 4 days.  Also notes left lower quadrant abdominal pain.  Denies any radiation of her pain.  No fevers or chills, no nausea or vomiting.  Does note decreased appetite recently.  States she has been dealing with issues with a UTI and was previously on Keflex. Does note intermittent NSAID use with naproxen.    Previous EGD in 2017 did show a prepyloric ulcer biopsies negative for H. pylori.  Colonoscopy in 2018 revealed diverticulosis and internal hemorrhoids, otherwise WNL.  Work-up in the ED revealed a hemoglobin of 7.1 which is now 8 after receiving 1 unit of PRBCs.  Hemoccult positive.  CT abdomen pelvis largely unremarkable.  Patient has been started on IV Protonix.  We have been consulted regarding patient suspected upper GI bleed.  Past Medical History:  Diagnosis Date  . Allergy   . Anemia   . Anginal pain ([place])   . Anxiety   . Arthritis   . Blood transfusion without reported diagnosis   . Breast cancer ([place])   . Carotid artery plaque    bilat  . Cataract   . Colon polyps    adenomatous  . Concussion 02/2016   Driving restrictions for 6w  . Coronary heart disease   . Depression   . Diverticulosis   . Dizziness   . Esophageal reflux   . Esophageal stricture   . Fibromyalgia   . Gallstones   . Hiatal hernia   . Hypercholesterolemia   . Hypertension   . Hypothyroidism   . IBS (irritable bowel syndrome)   . Kidney stones   . Osteoporosis   . Pneumonia    recently, just finished ABO  . Pneumonia   . PONV (postoperative nausea and  vomiting)   . Sleep apnea    CPAP  . Thyroid disease 2002  . Type II or unspecified type diabetes mellitus without mention of complication, not stated as uncontrolled   . Unspecified gastritis and gastroduodenitis without mention of hemorrhage     Past Surgical History:  Procedure Laterality Date  . ANAL RECTAL MANOMETRY N/A 08/05/2015   Procedure: ANO RECTAL MANOMETRY;  Surgeon: Mauri Pole, MD;  Location: [facility];  Service: Endoscopy;  Laterality: N/A;  . APPENDECTOMY    . BREAST LUMPECTOMY     left  . CARDIAC CATHETERIZATION    . CATARACT EXTRACTION W/PHACO Left 03/30/2013   Procedure: CATARACT EXTRACTION PHACO AND INTRAOCULAR LENS PLACEMENT (IOC);  Surgeon: Tonny Branch, MD;  Location: [facility];  Service: Ophthalmology;  Laterality: Left;  CDE:19.28  . CATARACT EXTRACTION W/PHACO Right 04/09/2013   Procedure: CATARACT EXTRACTION PHACO AND INTRAOCULAR LENS PLACEMENT (IOC);  Surgeon: Tonny Branch, MD;  Location: [facility];  Service: Ophthalmology;  Laterality: Right;  CDE: 15.44  . CERVICAL FUSION    . CESAREAN SECTION    . CHOLECYSTECTOMY    . CYSTOCELE REPAIR    . DILATION AND CURETTAGE OF UTERUS    . EYE SURGERY Left 03-30-13   Cataract  . EYE SURGERY Right 04-09-13   Cataract  . RECTOCELE REPAIR    .  THYROIDECTOMY    . TONSILLECTOMY    . TOTAL ABDOMINAL HYSTERECTOMY      Prior to Admission medications   Medication Sig Start Date End Date Taking? Authorizing Provider  aspirin 81 MG chewable tablet Chew 81 mg by mouth daily.   Yes [provider]  CALCIUM-MAGNESIUM-ZINC PO Take 3 tablets by mouth daily.   Yes [provider]  Coenzyme Q10 (CO Q-10) 100 MG CAPS Take 100 mg by mouth daily.   Yes [provider]  doxepin (SINEQUAN) 10 MG capsule Take by mouth. 02/16/19  Yes [provider]  DULoxetine (CYMBALTA) 60 MG capsule Take 60 mg by mouth daily.   Yes [provider]  EPINEPHrine 0.3 mg/0.3 mL IJ SOAJ injection  Frequency:PHARMDIR   Dosage:0.0     Instructions:  Note:self inject in the event of allergic reaction, then proceed immediately to the nearest Emergency Room. Dose: 1 INJECTION 07/18/09  Yes [provider]  fentaNYL (DURAGESIC - DOSED MCG/HR) 25 MCG/HR patch Place 25 mcg onto the skin every 3 (three) days.   Yes [provider]  fexofenadine (ALLEGRA) 30 MG tablet Take 30 mg by mouth daily.   Yes [provider]  fluconazole (DIFLUCAN) 150 MG tablet Take 150 mg by mouth once. 08/03/20  Yes [provider]  fluticasone (FLONASE) 50 MCG/ACT nasal spray Place 2 sprays into the nose daily.   Yes [provider]  furosemide (LASIX) 40 MG tablet Take 1 tablet (40 mg total) by mouth 3 (three) times a week. Take on Monday/Wednesday/Friday 07/06/20  Yes Weaver, Scott T, PA-C  gabapentin (NEURONTIN) 300 MG capsule Take 300 mg by mouth at bedtime.   Yes [provider]  gentamicin (GARAMYCIN) 0.3 % ophthalmic solution 1 drop 3 (three) times daily. 06/16/20  Yes [provider]  glipiZIDE (GLUCOTROL) 10 MG tablet Take 10 mg by mouth daily before breakfast.   Yes [provider]  glyBURIDE (DIABETA) 5 MG tablet Take 5 mg by mouth daily. 02/12/20  Yes [provider]  Insulin Glargine-Lixisenatide (SOLIQUA) 100-33 UNT-MCG/ML SOPN Inject 20 Units into the skin. Pt adjust between 20-25 units   Yes [provider]  LINZESS 290 MCG CAPS capsule TAKE 1 CAPSULE BY MOUTH EVERY DAY 04/12/20  Yes Nandigam, Venia Minks, MD  Melatonin 10 MG TABS Take by mouth.   Yes [provider]  metFORMIN (GLUCOPHAGE) 1000 MG tablet Take 1,000 mg by mouth 2 (two) times daily with a meal.   Yes [provider]  metolazone (ZAROXOLYN) 5 MG tablet Take 5 mg by mouth daily.   Yes [provider]  metoprolol succinate (TOPROL-XL) 50 MG 24 hr tablet TAKE 1 TABLET BY MOUTH EVERY DAY 07/08/20  Yes Sherren Mocha, MD  midodrine  (PROAMATINE) 10 MG tablet  07/11/20  Yes [provider]  montelukast (SINGULAIR) 10 MG tablet Take 10 mg by mouth at bedtime.   Yes [provider]  naproxen sodium (ALEVE) 220 MG tablet Take 220 mg by mouth.   Yes [provider]  nitroGLYCERIN (NITROSTAT) 0.4 MG SL tablet PLACE 1 TABLET (0.4 MG TOTAL) UNDER THE TONGUE EVERY 5 (FIVE) MINUTES AS NEEDED FOR CHEST PAIN. 12/03/19  Yes Sherren Mocha, MD  NOVOLOG FLEXPEN 100 UNIT/ML FlexPen Inject 100 Units into the skin daily as needed. Only use it if blood sugar is to high 06/20/20  Yes [provider]  Omega-3 Fatty Acids (FISH OIL PO) Take by mouth. 07/18/09  Yes [provider]  pantoprazole (PROTONIX) 40 MG tablet TAKE 1 TABLET (40 MG TOTAL) BY MOUTH 2 (TWO) TIMES DAILY. 07/25/20  Yes Nandigam, Venia Minks, MD  potassium chloride (KLOR-CON) 10 MEQ tablet Take 2 tablets (20 mEq total) by mouth 3 (three) times a week. 07/06/20  Yes Weaver, Scott T, PA-C  ramipril (ALTACE) 5 MG capsule Take 1 capsule (5 mg total) by mouth at bedtime. 01/11/16  Yes Sherren Mocha, MD  senna-docusate (SENOKOT-S) 8.6-50 MG tablet Take by mouth. 03/25/20  Yes [provider]  SYNTHROID 137 MCG tablet Take 137 mcg by mouth daily. 06/30/20  Yes [provider]  tiZANidine (ZANAFLEX) 4 MG tablet Take 4 mg by mouth 2 (two) times daily. 08/02/20  Yes [provider]  traMADol (ULTRAM) 50 MG tablet Take by mouth. 03/25/20  Yes [provider]  atorvastatin (LIPITOR) 20 MG tablet Take 1 tablet (20 mg total) by mouth daily. Patient not taking: Reported on 08/05/2020 10/17/12   Hillary Bow, MD  cyanocobalamin (,VITAMIN B-12,) 1000 MCG/ML injection INJECT 1 ML INTO MUSCLE ONCE A MONTH 04/27/19   Nandigam, Venia Minks, MD  lidocaine (LIDODERM) 5 % Place 1 patch onto the skin daily. Remove & Discard patch within 12 hours or as directed by MD    [provider]  Needles & Syringes MISC 25G 5/8 inch  needle on 3 ml syringe-Use once monthly for B12 injections 07/20/15   Nandigam, Venia Minks, MD  ondansetron (ZOFRAN) 4 MG tablet Take 1 tablet (4 mg total) by mouth every 8 (eight) hours as needed for nausea or vomiting. Patient not taking: Reported on 08/05/2020 01/30/17   Mauri Pole, MD  [facility] VERIO test strip 1 each by Other route as directed.  04/18/16   [provider]  Plecanatide (TRULANCE) 3 MG TABS Take 1 tablet by mouth daily. Patient not taking: Reported on 08/05/2020 08/25/19   Mauri Pole, MD    Current Facility-Administered Medications  Medication Dose Route Frequency Provider Last Rate Last Admin  . 0.9 %  sodium chloride infusion  10 mL/hr Intravenous Once Elgergawy, Dawood S, MD      . 0.9 % NaCl with KCl 40 mEq / L  infusion   Intravenous Continuous Elgergawy, Silver Huguenin, MD 50 mL/hr at 08/06/20 0034 New Bag at 08/06/20 0034  . acetaminophen (TYLENOL) tablet 650 mg  650 mg Oral Q6H PRN Elgergawy, Silver Huguenin, MD       Or  . acetaminophen (TYLENOL) suppository 650 mg  650 mg Rectal Q6H PRN Elgergawy, Silver Huguenin, MD      . doxepin (SINEQUAN) capsule 10 mg  10 mg Oral QHS Elgergawy, Silver Huguenin, MD   10 mg at 08/06/20 0046  . gabapentin (NEURONTIN) capsule 300 mg  300 mg Oral QHS Elgergawy, Silver Huguenin, MD   300 mg at 08/06/20 0046  . insulin aspart (novoLOG) injection 0-5 Units  0-5 Units Subcutaneous QHS Elgergawy, Dawood S, MD      . insulin aspart (novoLOG) injection 0-9 Units  0-9 Units Subcutaneous TID WC Elgergawy, Emeline Gins S, MD      . insulin glargine (LANTUS) injection 15 Units  15 Units Subcutaneous Daily Elgergawy, Silver Huguenin, MD      . levothyroxine (SYNTHROID) tablet 137 mcg  137 mcg Oral Daily Elgergawy, Silver Huguenin, MD   137 mcg at 08/06/20 0707  . metoprolol succinate (TOPROL-XL) 24 hr tablet 50 mg  50 mg Oral Daily Elgergawy, Silver Huguenin, MD      . midodrine (  PROAMATINE) tablet 2.5 mg  2.5 mg Oral TID Elgergawy, Silver Huguenin, MD   2.5 mg at 08/06/20 0047  .  montelukast (SINGULAIR) tablet 10 mg  10 mg Oral QHS Elgergawy, Silver Huguenin, MD   10 mg at 08/06/20 0045  . pantoprazole (PROTONIX) 80 mg in sodium chloride 0.9 % 100 mL (0.8 mg/mL) infusion  8 mg/hr Intravenous Continuous Elgergawy, Silver Huguenin, MD 10 mL/hr at 08/06/20 0054 8 mg/hr at 08/06/20 0054  . [START ON 08/09/2020] pantoprazole (PROTONIX) injection 40 mg  40 mg Intravenous Q12H Elgergawy, Silver Huguenin, MD      . tobramycin (TOBREX) 0.3 % ophthalmic solution 1 drop  1 drop Right Eye Q6H Elgergawy, Silver Huguenin, MD   1 drop at 08/06/20 0707    Allergies as of 08/05/2020 - Review Complete 08/05/2020  Allergen Reaction Noted  . Codeine Hives 04/27/2009  . Sulfonamide derivatives Hives 04/27/2009    Family History  Problem Relation Age of Onset  . Sarcoidosis Mother   . Diabetes Mother   . Cancer Mother        sarcoma -  Right arm amputation  . Hypertension Mother   . Lung cancer Father   . Diabetes Father   . Heart disease Father        Before age 20  . Urolithiasis Father   . Cancer Father        Lung  . Hyperlipidemia Father   . Hypertension Father   . Esophageal cancer Maternal Uncle   . Ovarian cancer Maternal Aunt   . Colon cancer Maternal Uncle   . Lung cancer Maternal Uncle   . Diabetes Maternal Uncle   . Diabetes Maternal Aunt   . Colon polyps Maternal Uncle   . Heart disease Paternal Grandmother     Social History   Socioeconomic History  . Marital status: Married    Spouse name: Not on file  . Number of children: 4  . Years of education: Not on file  . Highest education level: Not on file  Occupational History  . Occupation: Retired Optician, dispensing: RETIRED  Tobacco Use  . Smoking status: Never Smoker  . Smokeless tobacco: Never Used  Vaping Use  . Vaping Use: Never used  Substance and Sexual Activity  . Alcohol use: No  . Drug use: No  . Sexual activity: Not on file  Other Topics Concern  . Not on file  Social History Narrative  . Not on file   Social  Determinants of Health   Financial Resource Strain:   . Difficulty of Paying Living Expenses: Not on file  Food Insecurity:   . Worried About Charity fundraiser in the Last Year: Not on file  . Ran Out of Food in the Last Year: Not on file  Transportation Needs:   . Lack of Transportation (Medical): Not on file  . Lack of Transportation (Non-Medical): Not on file  Physical Activity:   . Days of Exercise per Week: Not on file  . Minutes of Exercise per Session: Not on file  Stress:   . Feeling of Stress : Not on file  Social Connections:   . Frequency of Communication with Friends and Family: Not on file  . Frequency of Social Gatherings with Friends and Family: Not on file  . Attends Religious Services: Not on file  . Active Member of Clubs or Organizations: Not on file  . Attends Archivist Meetings: Not on file  . Marital  Status: Not on file  Intimate Partner Violence:   . Fear of Current or Ex-Partner: Not on file  . Emotionally Abused: Not on file  . Physically Abused: Not on file  . Sexually Abused: Not on file    Review of Systems: General: Negative for anorexia, weight loss, fever, chills, fatigue, weakness. Eyes: Negative for vision changes.  ENT: Negative for hoarseness, difficulty swallowing , nasal congestion. CV: Negative for chest pain, angina, palpitations, dyspnea on exertion, peripheral edema.  Respiratory: Negative for dyspnea at rest, dyspnea on exertion, cough, sputum, wheezing.  GI: See history of present illness. GU:  Negative for dysuria, hematuria, urinary incontinence, urinary frequency, nocturnal urination.  MS: Negative for joint pain, low back pain.  Derm: Negative for rash or itching.  Neuro: Negative for weakness, abnormal sensation, seizure, frequent headaches, memory loss, confusion.  Psych: Negative for anxiety, depression, suicidal ideation, hallucinations.  Endo: Negative for unusual weight change.  Heme: Negative for bruising or  bleeding. Allergy: Negative for rash or hives.  Physical Exam: Vital signs in last 24 hours: Temp:  [97.7 F (36.5 C)-98.3 F (36.8 C)] 98.3 F (36.8 C) (10/30 0322) Pulse Rate:  [71-76] 76 (10/30 0322) Resp:  [15-25] 18 (10/30 0322) BP: (100-152)/(42-88) 126/50 (10/30 0322) SpO2:  [96 %-100 %] 96 % (10/30 0322) Weight:  [77.1 kg-80.8 kg] 80.8 kg (10/29 2246) Last BM Date: 08/05/20 General:   Alert,  Well-developed, well-nourished, pleasant and cooperative in NAD Head:  Normocephalic and atraumatic. Eyes:  Sclera clear, no icterus.   Conjunctiva pink. Ears:  Normal auditory acuity. Nose:  No deformity, discharge,  or lesions. Mouth:  No deformity or lesions, dentition normal. Neck:  Supple; no masses or thyromegaly. Lungs:  Clear throughout to auscultation.   No wheezes, crackles, or rhonchi. No acute distress. Heart:  Regular rate and rhythm; no murmurs, clicks, rubs,  or gallops. Abdomen:  Soft, nontender and nondistended. No masses, hepatosplenomegaly or hernias noted. Normal bowel sounds, without guarding, and without rebound.   Rectal:  Deferred until time of colonoscopy.   Msk:  Symmetrical without gross deformities. Normal posture. Pulses:  Normal pulses noted. Extremities:  Without clubbing or edema. Neurologic:  Alert and  oriented x4;  grossly normal neurologically. Skin:  Intact without significant lesions or rashes. Cervical Nodes:  No significant cervical adenopathy. Psych:  Alert and cooperative. Normal mood and affect.  Intake/Output from previous day: 10/29 0701 - 10/30 0700 In: 890.9 [P.O.:120; I.V.:101.7; Blood:569.2; IV Piggyback:100] Out: -  Intake/Output this shift: No intake/output data recorded.  Lab Results: Recent Labs    08/05/20 1805 08/06/20 0101 08/06/20 0547  WBC 12.6* 10.1 9.7  HGB 7.1* 8.0* 7.8*  HCT 24.1* 26.7* 26.1*  PLT 468* 399 407*   BMET Recent Labs    08/05/20 1805 08/06/20 0547  NA 130* 133*  K 3.3* 3.1*  CL 92* 96*   CO2 30 28  GLUCOSE 87 172*  BUN 22 15  CREATININE 1.02* 0.78  CALCIUM 8.8* 8.4*   LFT Recent Labs    08/05/20 1805  PROT 7.0  ALBUMIN 3.7  AST 18  ALT 13  ALKPHOS 52  BILITOT 0.6   PT/INR Recent Labs    08/05/20 1805  LABPROT 12.8  INR 1.0   Hepatitis Panel No results for input(s): HEPBSAG, HCVAB, HEPAIGM, HEPBIGM in the last 72 hours. C-Diff No results for input(s): CDIFFTOX in the last 72 hours.  Studies/Results: CT ABDOMEN PELVIS W WO CONTRAST  Result Date: 08/06/2020 CLINICAL DATA:  Left lower quadrant abdominal pain, melena, EXAM: CT ABDOMEN AND PELVIS WITHOUT AND WITH CONTRAST TECHNIQUE: Multidetector CT imaging of the abdomen and pelvis was performed following the standard protocol before and following the bolus administration of intravenous contrast. CONTRAST:  136mL OMNIPAQUE IOHEXOL 300 MG/ML  SOLN COMPARISON:  None. FINDINGS: Lower chest: The visualized lung bases are clear bilaterally. The visualized heart and pericardium are unremarkable. Moderate hiatal hernia. Hepatobiliary: Status post cholecystectomy. Liver unremarkable. No intra or extrahepatic biliary ductal dilation. Pancreas: Diffusely atrophic, but otherwise unremarkable. Spleen: Unremarkable Adrenals/Urinary Tract: Adrenal glands unremarkable. Kidneys are normal. The bladder is partially obscured by streak artifact from left total hip arthroplasty, however, the visualized portion is unremarkable peer Stomach/Bowel: Intra-abdominal stomach is unremarkable. Small and large bowel are unremarkable. No evidence of obstruction or focal inflammation. Appendix normal. No free intraperitoneal gas or fluid. Tiny fat containing umbilical hernia. Vascular/Lymphatic: Moderate aortoiliac atherosclerotic calcification. No aneurysm. No pathologic adenopathy within the abdomen and pelvis. Reproductive: Status post hysterectomy. No adnexal masses. Other: Rectum unremarkable Musculoskeletal: No lytic or blastic bone lesions are  seen. Degenerative changes are seen within the thoracolumbar spine. Left total hip arthroplasty has been performed. IMPRESSION: No radiographic explanation for the patient's reported symptoms. Incidental findings as noted above. Aortic Atherosclerosis (ICD10-I70.0). Electronically Signed   By: Fidela Salisbury MD   On: 08/06/2020 00:24   DG Chest Port 1 View  Result Date: 08/05/2020 CLINICAL DATA:  Weakness. EXAM: PORTABLE CHEST 1 VIEW COMPARISON:  02/03/2020 rib/chest radiograph and prior. FINDINGS: Mild hypoinflation. No focal consolidation, pneumothorax or pleural effusion. Cardiomediastinal silhouette is unchanged. Aortic atherosclerotic calcifications. Sequela of ACDF. IMPRESSION: No focal airspace disease. Electronically Signed   By: Primitivo Gauze M.D.   On: 08/05/2020 18:15    Impression: *Acute GI bleeding-source likely upper given melena *Acute blood loss anemia due to above *History of gastric ulcer *Intermittent NSAID use  Plan: Etiology of patient's GI bleed unclear.  Differential includes gastritis, duodenitis, Mallory-Weiss, AVMs, Dieulafoy lesion, polyps, malignancy, or other.Will proceed with EGD to further evaluate.   The risks including infection, bleed, or perforation as well as benefits, limitations, alternatives and imponderables have been reviewed with the patient. Potential for esophageal dilation, biopsy, etc. have also been reviewed.  Questions have been answered. All parties agreeable.  Continue on IV Protonix  Keep patient n.p.o. Continue to monitor hemoglobin transfuse for less than 7. Avoid NSAIDs.  Thank you for letting us take part in this patient's care.  Further recommendations to follow.  Elon Alas. Abbey Chatters, D.O. Gastroenterology and Hepatology [facility]    LOS: 1 day     08/06/2020, 8:53 AM

## 2020-08-06 NOTE — Interval H&P Note (Signed)
History and Physical Interval Note:  08/06/2020 10:27 AM  Jeanne Kim  has presented today for surgery, with the diagnosis of melena.  The various methods of treatment have been discussed with the patient and family. After consideration of risks, benefits and other options for treatment, the patient has consented to  Procedure(s): ESOPHAGOGASTRODUODENOSCOPY (EGD) WITH PROPOFOL (N/A) as a surgical intervention.  The patient's history has been reviewed, patient examined, no change in status, stable for surgery.  I have reviewed the patient's chart and labs.  Questions were answered to the patient's satisfaction.     Eloise Harman

## 2020-08-06 NOTE — Progress Notes (Signed)
PROGRESS NOTE    [name]  OVF:[id number] DOB: 11/20/1940 DOA: 08/05/2020 PCP: Neale Burly, MD   Brief Narrative: Jeanne Kim is a 79 y.o. female, past medical history of hypertension, CAD, hypothyroidism, breast cancer, fibromyalgia, irritable bowel syndrome, hypothyroidism, diabetes mellitus ED secondary to melena, patient with history of irritable bowel syndrome, most recent endoscopy 2017 with evidence of gastric ulcer, colonoscopy 2018 with internal hemorrhoids and diverticulosis. Patient presented secondary to melena and found to have associated acute anemia with concern for upper GI bleed.   Assessment & Plan:   Active Problems:   HYPERCHOLESTEROLEMIA  IIA   Essential hypertension   Coronary atherosclerosis   Esophageal reflux   IBS (irritable bowel syndrome)   DM (diabetes mellitus) (HCC)   Diverticulosis of colon   Adult hypothyroidism   GI bleed   Symptomatic anemia Acute blood loss anemia Baseline hemoglobin of 9 from September 2021. Hemoglobin of 1.1 on admission secondary to blood loss from GI bleeding. Patient received 1 unit of PRBC. Hemoglobin rebound to 8 and stable at 7.8.  Upper GI bleed History of gastric ulcers and associated melena -GI recommendations: EGD today  History of CAD On aspirin as an outpatient. Held secondary to above. Also on Toprol XL.  -Continue Toprol XL  Essential hypertension Patient is on ramipril and Toprol XL as an outpatient -Continue Toprol  Fibromyalgia Chronic pain syndrome Patient is on Fentanyl patch, gabapentin as an outpatient -Resume Fentanyl patch and continue gabapentin  Irritable bowel syndrome -Continue linzess  Diabetes mellitus, type 2 Patient is on Lantus, metformin, glipizide, glyburide and Novolog -Continue Lantus and SSI  Hyperlipidemia Patient was on a statin for which she has stopped taking. Outpatient follow-up.  Hypokalemia Mild. Given potassium in IV fluids -Kdur 40 meq  x2  Hyponatremia Mild. Improved with IV fluids. Can recheck BMP as an outpatient.  History of UTI Completed course outpatient.  Hypothyroidism -Continue Synthroid  Possible orthostatic hypotension Patient is on midodrine for lightheadedness when getting out of bed in the morning. This is a chronic medication for which she takes in addition to antihypertensives. -Continue midodrine   DVT prophylaxis: SCDs Code Status:   Code Status: Full Code Family Communication: None at bedside Disposition Plan: Home likely in 1-2 days pending GI recommendations/management   Consultants:   Gastroenterology  Procedures:   None  Antimicrobials:  None    Subjective: No issues overnight. No bowel movement.  Objective: Vitals:   08/05/20 2100 08/05/20 2130 08/05/20 2246 08/06/20 0322  BP: (!) 123/48 129/65 138/88 (!) 126/50  Pulse: 74 75 72 76  Resp: (!) 25 20 18 18   Temp:   97.7 F (36.5 C) 98.3 F (36.8 C)  TempSrc:   Oral Oral  SpO2: 97% 97% 97% 96%  Weight:   80.8 kg   Height:   5\' 4"  (1.626 m)     Intake/Output Summary (Last 24 hours) at 08/06/2020 0821 Last data filed at 08/06/2020 0400 Gross per 24 hour  Intake 890.88 ml  Output --  Net 890.88 ml   Filed Weights   08/05/20 1726 08/05/20 2246  Weight: 77.1 kg 80.8 kg    Examination:  General exam: Appears calm and comfortable Respiratory system: Clear to auscultation. Respiratory effort normal. Cardiovascular system: S1 & S2 heard, RRR. No murmurs, rubs, gallops or clicks. Gastrointestinal system: Abdomen is nondistended, soft and nontender. No organomegaly or masses felt. Normal bowel sounds heard. Central nervous system: Alert and oriented. No focal neurological deficits. Musculoskeletal:  No edema. No calf tenderness Skin: No cyanosis. No rashes Psychiatry: Judgement and insight appear normal. Mood & affect appropriate.     Data Reviewed: I have personally reviewed following labs and imaging  studies  CBC Lab Results  Component Value Date   WBC 9.7 08/06/2020   RBC 3.25 (L) 08/06/2020   HGB 7.8 (L) 08/06/2020   HCT 26.1 (L) 08/06/2020   MCV 80.3 08/06/2020   MCH 24.0 (L) 08/06/2020   PLT 407 (H) 08/06/2020   MCHC 29.9 (L) 08/06/2020   RDW 19.0 (H) 08/06/2020   LYMPHSABS 2.8 08/05/2020   MONOABS 2.0 (H) 08/05/2020   EOSABS 1.2 (H) 08/05/2020   BASOSABS 0.1 62/70/3500     Last metabolic panel Lab Results  Component Value Date   NA 133 (L) 08/06/2020   K 3.1 (L) 08/06/2020   CL 96 (L) 08/06/2020   CO2 28 08/06/2020   BUN 15 08/06/2020   CREATININE 0.78 08/06/2020   GLUCOSE 172 (H) 08/06/2020   GFRNONAA >60 08/06/2020   GFRAA 72 07/14/2020   CALCIUM 8.4 (L) 08/06/2020   PROT 7.0 08/05/2020   ALBUMIN 3.7 08/05/2020   BILITOT 0.6 08/05/2020   ALKPHOS 52 08/05/2020   AST 18 08/05/2020   ALT 13 08/05/2020   ANIONGAP 9 08/06/2020    CBG (last 3)  Recent Labs    08/06/20 0204  GLUCAP 77     GFR: Estimated Creatinine Clearance: 60.5 mL/min (by C-G formula based on SCr of 0.78 mg/dL).  Coagulation Profile: Recent Labs  Lab 08/05/20 1805  INR 1.0    Recent Results (from the past 240 hour(s))  Resp Panel by RT PCR (RSV, Flu A&B, Covid) - Nasopharyngeal Swab     Status: None   Collection Time: 08/05/20  6:43 PM   Specimen: Nasopharyngeal Swab  Result Value Ref Range Status   SARS Coronavirus 2 by RT PCR NEGATIVE NEGATIVE Final    Comment: (NOTE) SARS-CoV-2 target nucleic acids are NOT DETECTED.  The SARS-CoV-2 RNA is generally detectable in upper respiratoy specimens during the acute phase of infection. The lowest concentration of SARS-CoV-2 viral copies this assay can detect is 131 copies/mL. A negative result does not preclude SARS-Cov-2 infection and should not be used as the sole basis for treatment or other patient management decisions. A negative result may occur with  improper specimen collection/handling, submission of specimen  other than nasopharyngeal swab, presence of viral mutation(s) within the areas targeted by this assay, and inadequate number of viral copies (<131 copies/mL). A negative result must be combined with clinical observations, patient history, and epidemiological information. The expected result is Negative.  Fact Sheet for Patients:  PinkCheek.be  Fact Sheet for Healthcare Providers:  GravelBags.it  This test is no t yet approved or cleared by the [place] FDA and  has been authorized for detection and/or diagnosis of SARS-CoV-2 by FDA under an Emergency Use Authorization (EUA). This EUA will remain  in effect (meaning this test can be used) for the duration of the COVID-19 declaration under Section 564(b)(1) of the Act, 21 U.S.C. section 360bbb-3(b)(1), unless the authorization is terminated or revoked sooner.     Influenza A by PCR NEGATIVE NEGATIVE Final   Influenza B by PCR NEGATIVE NEGATIVE Final    Comment: (NOTE) The Xpert Xpress SARS-CoV-2/FLU/RSV assay is intended as an aid in  the diagnosis of influenza from Nasopharyngeal swab specimens and  should not be used as a sole basis for treatment. Nasal washings and  aspirates  are unacceptable for Xpert Xpress SARS-CoV-2/FLU/RSV  testing.  Fact Sheet for Patients: PinkCheek.be  Fact Sheet for Healthcare Providers: GravelBags.it  This test is not yet approved or cleared by the [place] FDA and  has been authorized for detection and/or diagnosis of SARS-CoV-2 by  FDA under an Emergency Use Authorization (EUA). This EUA will remain  in effect (meaning this test can be used) for the duration of the  Covid-19 declaration under Section 564(b)(1) of the Act, 21  U.S.C. section 360bbb-3(b)(1), unless the authorization is  terminated or revoked.    Respiratory Syncytial Virus by PCR NEGATIVE NEGATIVE Final     Comment: (NOTE) Fact Sheet for Patients: PinkCheek.be  Fact Sheet for Healthcare Providers: GravelBags.it  This test is not yet approved or cleared by the [place] FDA and  has been authorized for detection and/or diagnosis of SARS-CoV-2 by  FDA under an Emergency Use Authorization (EUA). This EUA will remain  in effect (meaning this test can be used) for the duration of the  COVID-19 declaration under Section 564(b)(1) of the Act, 21 U.S.C.  section 360bbb-3(b)(1), unless the authorization is terminated or  revoked. Performed at [facility], [address] [place], [place] [address]         Radiology Studies: CT ABDOMEN PELVIS W WO CONTRAST  Result Date: 08/06/2020 CLINICAL DATA:  Left lower quadrant abdominal pain, melena, EXAM: CT ABDOMEN AND PELVIS WITHOUT AND WITH CONTRAST TECHNIQUE: Multidetector CT imaging of the abdomen and pelvis was performed following the standard protocol before and following the bolus administration of intravenous contrast. CONTRAST:  179mL OMNIPAQUE IOHEXOL 300 MG/ML  SOLN COMPARISON:  None. FINDINGS: Lower chest: The visualized lung bases are clear bilaterally. The visualized heart and pericardium are unremarkable. Moderate hiatal hernia. Hepatobiliary: Status post cholecystectomy. Liver unremarkable. No intra or extrahepatic biliary ductal dilation. Pancreas: Diffusely atrophic, but otherwise unremarkable. Spleen: Unremarkable Adrenals/Urinary Tract: Adrenal glands unremarkable. Kidneys are normal. The bladder is partially obscured by streak artifact from left total hip arthroplasty, however, the visualized portion is unremarkable peer Stomach/Bowel: Intra-abdominal stomach is unremarkable. Small and large bowel are unremarkable. No evidence of obstruction or focal inflammation. Appendix normal. No free intraperitoneal gas or fluid. Tiny fat containing umbilical hernia. Vascular/Lymphatic:  Moderate aortoiliac atherosclerotic calcification. No aneurysm. No pathologic adenopathy within the abdomen and pelvis. Reproductive: Status post hysterectomy. No adnexal masses. Other: Rectum unremarkable Musculoskeletal: No lytic or blastic bone lesions are seen. Degenerative changes are seen within the thoracolumbar spine. Left total hip arthroplasty has been performed. IMPRESSION: No radiographic explanation for the patient's reported symptoms. Incidental findings as noted above. Aortic Atherosclerosis (ICD10-I70.0). Electronically Signed   By: Fidela Salisbury MD   On: 08/06/2020 00:24   DG Chest Port 1 View  Result Date: 08/05/2020 CLINICAL DATA:  Weakness. EXAM: PORTABLE CHEST 1 VIEW COMPARISON:  02/03/2020 rib/chest radiograph and prior. FINDINGS: Mild hypoinflation. No focal consolidation, pneumothorax or pleural effusion. Cardiomediastinal silhouette is unchanged. Aortic atherosclerotic calcifications. Sequela of ACDF. IMPRESSION: No focal airspace disease. Electronically Signed   By: Primitivo Gauze M.D.   On: 08/05/2020 18:15        Scheduled Meds: . doxepin  10 mg Oral QHS  . gabapentin  300 mg Oral QHS  . insulin aspart  0-5 Units Subcutaneous QHS  . insulin aspart  0-9 Units Subcutaneous TID WC  . insulin glargine  15 Units Subcutaneous Daily  . levothyroxine  137 mcg Oral Daily  . metoprolol succinate  50 mg Oral Daily  .  midodrine  2.5 mg Oral TID  . montelukast  10 mg Oral QHS  . [START ON 08/09/2020] pantoprazole  40 mg Intravenous Q12H  . tobramycin  1 drop Right Eye Q6H   Continuous Infusions: . sodium chloride    . 0.9 % NaCl with KCl 40 mEq / L 50 mL/hr at 08/06/20 0034  . pantoprozole (PROTONIX) infusion 8 mg/hr (08/06/20 0054)     LOS: 1 day     Cordelia Poche, MD [facility] 08/06/2020, 8:21 AM  If 7PM-7AM, please contact night-coverage [email or web address]

## 2020-08-06 NOTE — Telephone Encounter (Signed)
Patient will need repeat EGD with me in approximately 8 weeks.  Can we set this up.  ASA 3.  Thank you!  Abbey Chatters

## 2020-08-06 NOTE — Op Note (Signed)
[facility] Patient Name: Jeanne Kim Procedure Date: 08/06/2020 10:13 AM MRN: [id number] Date of Birth: 02/02/1941 Attending MD: Elon Alas. Abbey Chatters DO CSN: [id number] Age: 79 Admit Type: Inpatient Procedure:                Upper GI endoscopy Indications:              Acute post hemorrhagic anemia, Melena Providers:                Elon Alas. Abbey Chatters, DO, Crystal Page, Nelma Rothman,                            Technician Referring MD:              Medicines:                See the Anesthesia note for documentation of the                            administered medications Complications:            No immediate complications. Estimated Blood Loss:     Estimated blood loss was minimal. Procedure:                Pre-Anesthesia Assessment:                           - The anesthesia plan was to use monitored                            anesthesia care (MAC).                           After obtaining informed consent, the endoscope was                            passed under direct vision. Throughout the                            procedure, the patient's blood pressure, pulse, and                            oxygen saturations were monitored continuously. The                            Endoscope was introduced through the mouth, and                            advanced to the second part of duodenum. The upper                            GI endoscopy was accomplished without difficulty.                            The patient tolerated the procedure well. Scope In: 10:41:40 AM Scope Out: 10:44:56 AM Total Procedure Duration: 0 hours 3 minutes 16 seconds  Findings:      LA Grade A (one or more mucosal breaks  less than 5 mm, not extending       between tops of 2 mucosal folds) esophagitis with no bleeding was found       at the gastroesophageal junction.      Multiple non-bleeding cratered gastric ulcers with no stigmata of       bleeding were found in the gastric body. Biopsies were taken  with a cold       forceps for Helicobacter pylori testing.      The duodenal bulb, first portion of the duodenum and second portion of       the duodenum were normal.      A medium-sized hiatal hernia was present. Impression:               - LA Grade A reflux esophagitis with no bleeding.                           - Non-bleeding gastric ulcers with no stigmata of                            bleeding. Biopsied.                           - Normal duodenal bulb, first portion of the                            duodenum and second portion of the duodenum.                           - Medium-sized hiatal hernia. Moderate Sedation:      Per Anesthesia Care Recommendation:           - Return patient to hospital ward for ongoing care.                           - Soft diet.                           - Continue on IV Protonix 40 mg BID while                            inpatient. She will need to be discharged on PPI                            such as Omeprazole of Pantoprazole 40 mg BID.                           - Repeat upper endoscopy in 8 weeks to evaluate the                            response to therapy. Procedure Code(s):        --- Professional ---                           [phone], Esophagogastroduodenoscopy, flexible,  transoral; with biopsy, single or multiple Diagnosis Code(s):        --- Professional ---                           K21.00, Gastro-esophageal reflux disease with                            esophagitis, without bleeding                           K25.9, Gastric ulcer, unspecified as acute or                            chronic, without hemorrhage or perforation                           D62, Acute posthemorrhagic anemia                           K92.1, Melena (includes Hematochezia) CPT copyright 2019 American Medical Association. All rights reserved. The codes documented in this report are preliminary and upon coder review may  be revised to meet  current compliance requirements. Elon Alas. Abbey Chatters, DO Gregory Woodbine, DO 08/06/2020 10:55:50 AM This report has been signed electronically. Number of Addenda: 0

## 2020-08-06 NOTE — Anesthesia Postprocedure Evaluation (Signed)
Anesthesia Post Note  Patient: FRIEDA ARNALL  Procedure(s) Performed: ESOPHAGOGASTRODUODENOSCOPY (EGD) WITH PROPOFOL (N/A ) BIOPSY  Patient location during evaluation: PACU Anesthesia Type: General Level of consciousness: awake and alert Pain management: pain level controlled Vital Signs Assessment: post-procedure vital signs reviewed and stable Respiratory status: spontaneous breathing, nonlabored ventilation, respiratory function stable and patient connected to nasal cannula oxygen Cardiovascular status: blood pressure returned to baseline and stable Postop Assessment: no apparent nausea or vomiting Anesthetic complications: no   No complications documented.   Last Vitals:  Vitals:   08/06/20 0950 08/06/20 1049  BP: (!) 141/72 (!) 94/38  Pulse: 75 72  Resp: 18 18  Temp: 37 C 36.8 C  SpO2: 97% 92%    Last Pain:  Vitals:   08/06/20 1049  TempSrc:   PainSc: 2                  Raji P Pricilla Handler

## 2020-08-06 NOTE — Consult Note (Signed)
Consulting  Provider: Dr. Waldron Labs Primary Care Physician:  Neale Burly, MD  Reason for Consultation:  Melena, anemia   HPI:  Jeanne Kim is a 79 y.o. female with a past medical history of HTN, CAD, hypothyroidism, irritable bowel syndrome, diabetes, who was admitted to [facility] yesterday evening after initially presenting with generalized weakness/fatigue as well as melena.  Patient states she has had melena for the past 3 to 4 days.  Also notes left lower quadrant abdominal pain.  Denies any radiation of her pain.  No fevers or chills, no nausea or vomiting.  Does note decreased appetite recently.  States she has been dealing with issues with a UTI and was previously on Keflex. Does note intermittent NSAID use with naproxen.    Previous EGD in 2017 did show a prepyloric ulcer biopsies negative for H. pylori.  Colonoscopy in 2018 revealed diverticulosis and internal hemorrhoids, otherwise WNL.  Work-up in the ED revealed a hemoglobin of 7.1 which is now 8 after receiving 1 unit of PRBCs.  Hemoccult positive.  CT abdomen pelvis largely unremarkable.  Patient has been started on IV Protonix.  We have been consulted regarding patient suspected upper GI bleed.  Past Medical History:  Diagnosis Date  . Allergy   . Anemia   . Anginal pain ([place])   . Anxiety   . Arthritis   . Blood transfusion without reported diagnosis   . Breast cancer ([place])   . Carotid artery plaque    bilat  . Cataract   . Colon polyps    adenomatous  . Concussion 02/2016   Driving restrictions for 6w  . Coronary heart disease   . Depression   . Diverticulosis   . Dizziness   . Esophageal reflux   . Esophageal stricture   . Fibromyalgia   . Gallstones   . Hiatal hernia   . Hypercholesterolemia   . Hypertension   . Hypothyroidism   . IBS (irritable bowel syndrome)   . Kidney stones   . Osteoporosis   . Pneumonia    recently, just finished ABO  . Pneumonia   . PONV (postoperative nausea and  vomiting)   . Sleep apnea    CPAP  . Thyroid disease 2002  . Type II or unspecified type diabetes mellitus without mention of complication, not stated as uncontrolled   . Unspecified gastritis and gastroduodenitis without mention of hemorrhage     Past Surgical History:  Procedure Laterality Date  . ANAL RECTAL MANOMETRY N/A 08/05/2015   Procedure: ANO RECTAL MANOMETRY;  Surgeon: Mauri Pole, MD;  Location: [facility];  Service: Endoscopy;  Laterality: N/A;  . APPENDECTOMY    . BREAST LUMPECTOMY     left  . CARDIAC CATHETERIZATION    . CATARACT EXTRACTION W/PHACO Left 03/30/2013   Procedure: CATARACT EXTRACTION PHACO AND INTRAOCULAR LENS PLACEMENT (IOC);  Surgeon: Tonny Branch, MD;  Location: [facility]S;  Service: Ophthalmology;  Laterality: Left;  CDE:19.28  . CATARACT EXTRACTION W/PHACO Right 04/09/2013   Procedure: CATARACT EXTRACTION PHACO AND INTRAOCULAR LENS PLACEMENT (IOC);  Surgeon: Tonny Branch, MD;  Location: [facility];  Service: Ophthalmology;  Laterality: Right;  CDE: 15.44  . CERVICAL FUSION    . CESAREAN SECTION    . CHOLECYSTECTOMY    . CYSTOCELE REPAIR    . DILATION AND CURETTAGE OF UTERUS    . EYE SURGERY Left 03-30-13   Cataract  . EYE SURGERY Right 04-09-13   Cataract  . RECTOCELE REPAIR    .  THYROIDECTOMY    . TONSILLECTOMY    . TOTAL ABDOMINAL HYSTERECTOMY      Prior to Admission medications   Medication Sig Start Date End Date Taking? Authorizing Provider  aspirin 81 MG chewable tablet Chew 81 mg by mouth daily.   Yes [provider]  CALCIUM-MAGNESIUM-ZINC PO Take 3 tablets by mouth daily.   Yes [provider]  Coenzyme Q10 (CO Q-10) 100 MG CAPS Take 100 mg by mouth daily.   Yes [provider]  doxepin (SINEQUAN) 10 MG capsule Take by mouth. 02/16/19  Yes [provider]  DULoxetine (CYMBALTA) 60 MG capsule Take 60 mg by mouth daily.   Yes [provider]  EPINEPHrine 0.3 mg/0.3 mL IJ SOAJ injection  Frequency:PHARMDIR   Dosage:0.0     Instructions:  Note:self inject in the event of allergic reaction, then proceed immediately to the nearest Emergency Room. Dose: 1 INJECTION 07/18/09  Yes [provider]  fentaNYL (DURAGESIC - DOSED MCG/HR) 25 MCG/HR patch Place 25 mcg onto the skin every 3 (three) days.   Yes [provider]  fexofenadine (ALLEGRA) 30 MG tablet Take 30 mg by mouth daily.   Yes [provider]  fluconazole (DIFLUCAN) 150 MG tablet Take 150 mg by mouth once. 08/03/20  Yes [provider]  fluticasone (FLONASE) 50 MCG/ACT nasal spray Place 2 sprays into the nose daily.   Yes [provider]  furosemide (LASIX) 40 MG tablet Take 1 tablet (40 mg total) by mouth 3 (three) times a week. Take on Monday/Wednesday/Friday 07/06/20  Yes Weaver, Scott T, PA-C  gabapentin (NEURONTIN) 300 MG capsule Take 300 mg by mouth at bedtime.   Yes [provider]  gentamicin (GARAMYCIN) 0.3 % ophthalmic solution 1 drop 3 (three) times daily. 06/16/20  Yes [provider]  glipiZIDE (GLUCOTROL) 10 MG tablet Take 10 mg by mouth daily before breakfast.   Yes [provider]  glyBURIDE (DIABETA) 5 MG tablet Take 5 mg by mouth daily. 02/12/20  Yes [provider]  Insulin Glargine-Lixisenatide (SOLIQUA) 100-33 UNT-MCG/ML SOPN Inject 20 Units into the skin. Pt adjust between 20-25 units   Yes [provider]  LINZESS 290 MCG CAPS capsule TAKE 1 CAPSULE BY MOUTH EVERY DAY 04/12/20  Yes Nandigam, Venia Minks, MD  Melatonin 10 MG TABS Take by mouth.   Yes [provider]  metFORMIN (GLUCOPHAGE) 1000 MG tablet Take 1,000 mg by mouth 2 (two) times daily with a meal.   Yes [provider]  metolazone (ZAROXOLYN) 5 MG tablet Take 5 mg by mouth daily.   Yes [provider]  metoprolol succinate (TOPROL-XL) 50 MG 24 hr tablet TAKE 1 TABLET BY MOUTH EVERY DAY 07/08/20  Yes Sherren Mocha, MD  midodrine  (PROAMATINE) 10 MG tablet  07/11/20  Yes [provider]  montelukast (SINGULAIR) 10 MG tablet Take 10 mg by mouth at bedtime.   Yes [provider]  naproxen sodium (ALEVE) 220 MG tablet Take 220 mg by mouth.   Yes [provider]  nitroGLYCERIN (NITROSTAT) 0.4 MG SL tablet PLACE 1 TABLET (0.4 MG TOTAL) UNDER THE TONGUE EVERY 5 (FIVE) MINUTES AS NEEDED FOR CHEST PAIN. 12/03/19  Yes Sherren Mocha, MD  NOVOLOG FLEXPEN 100 UNIT/ML FlexPen Inject 100 Units into the skin daily as needed. Only use it if blood sugar is to high 06/20/20  Yes [provider]  Omega-3 Fatty Acids (FISH OIL PO) Take by mouth. 07/18/09  Yes [provider]  pantoprazole (PROTONIX) 40 MG tablet TAKE 1 TABLET (40 MG TOTAL) BY MOUTH 2 (TWO) TIMES DAILY. 07/25/20  Yes Nandigam, Venia Minks, MD  potassium chloride (KLOR-CON) 10 MEQ tablet Take 2 tablets (20 mEq total) by mouth 3 (three) times a week. 07/06/20  Yes Weaver, Scott T, PA-C  ramipril (ALTACE) 5 MG capsule Take 1 capsule (5 mg total) by mouth at bedtime. 01/11/16  Yes Sherren Mocha, MD  senna-docusate (SENOKOT-S) 8.6-50 MG tablet Take by mouth. 03/25/20  Yes [provider]  SYNTHROID 137 MCG tablet Take 137 mcg by mouth daily. 06/30/20  Yes [provider]  tiZANidine (ZANAFLEX) 4 MG tablet Take 4 mg by mouth 2 (two) times daily. 08/02/20  Yes [provider]  traMADol (ULTRAM) 50 MG tablet Take by mouth. 03/25/20  Yes [provider]  atorvastatin (LIPITOR) 20 MG tablet Take 1 tablet (20 mg total) by mouth daily. Patient not taking: Reported on 08/05/2020 10/17/12   Hillary Bow, MD  cyanocobalamin (,VITAMIN B-12,) 1000 MCG/ML injection INJECT 1 ML INTO MUSCLE ONCE A MONTH 04/27/19   Nandigam, Venia Minks, MD  lidocaine (LIDODERM) 5 % Place 1 patch onto the skin daily. Remove & Discard patch within 12 hours or as directed by MD    [provider]  Needles & Syringes MISC 25G 5/8 inch  needle on 3 ml syringe-Use once monthly for B12 injections 07/20/15   Nandigam, Venia Minks, MD  ondansetron (ZOFRAN) 4 MG tablet Take 1 tablet (4 mg total) by mouth every 8 (eight) hours as needed for nausea or vomiting. Patient not taking: Reported on 08/05/2020 01/30/17   Mauri Pole, MD  [facility] VERIO test strip 1 each by Other route as directed.  04/18/16   [provider]  Plecanatide (TRULANCE) 3 MG TABS Take 1 tablet by mouth daily. Patient not taking: Reported on 08/05/2020 08/25/19   Mauri Pole, MD    Current Facility-Administered Medications  Medication Dose Route Frequency Provider Last Rate Last Admin  . 0.9 %  sodium chloride infusion  10 mL/hr Intravenous Once Elgergawy, Dawood S, MD      . 0.9 % NaCl with KCl 40 mEq / L  infusion   Intravenous Continuous Elgergawy, Silver Huguenin, MD 50 mL/hr at 08/06/20 0034 New Bag at 08/06/20 0034  . acetaminophen (TYLENOL) tablet 650 mg  650 mg Oral Q6H PRN Elgergawy, Silver Huguenin, MD       Or  . acetaminophen (TYLENOL) suppository 650 mg  650 mg Rectal Q6H PRN Elgergawy, Silver Huguenin, MD      . doxepin (SINEQUAN) capsule 10 mg  10 mg Oral QHS Elgergawy, Silver Huguenin, MD   10 mg at 08/06/20 0046  . gabapentin (NEURONTIN) capsule 300 mg  300 mg Oral QHS Elgergawy, Silver Huguenin, MD   300 mg at 08/06/20 0046  . insulin aspart (novoLOG) injection 0-5 Units  0-5 Units Subcutaneous QHS Elgergawy, Dawood S, MD      . insulin aspart (novoLOG) injection 0-9 Units  0-9 Units Subcutaneous TID WC Elgergawy, Emeline Gins S, MD      . insulin glargine (LANTUS) injection 15 Units  15 Units Subcutaneous Daily Elgergawy, Silver Huguenin, MD      . levothyroxine (SYNTHROID) tablet 137 mcg  137 mcg Oral Daily Elgergawy, Silver Huguenin, MD   137 mcg at 08/06/20 0707  . metoprolol succinate (TOPROL-XL) 24 hr tablet 50 mg  50 mg Oral Daily Elgergawy, Silver Huguenin, MD      . midodrine (  PROAMATINE) tablet 2.5 mg  2.5 mg Oral TID Elgergawy, Silver Huguenin, MD   2.5 mg at 08/06/20 0047  .  montelukast (SINGULAIR) tablet 10 mg  10 mg Oral QHS Elgergawy, Silver Huguenin, MD   10 mg at 08/06/20 0045  . pantoprazole (PROTONIX) 80 mg in sodium chloride 0.9 % 100 mL (0.8 mg/mL) infusion  8 mg/hr Intravenous Continuous Elgergawy, Silver Huguenin, MD 10 mL/hr at 08/06/20 0054 8 mg/hr at 08/06/20 0054  . [START ON 08/09/2020] pantoprazole (PROTONIX) injection 40 mg  40 mg Intravenous Q12H Elgergawy, Silver Huguenin, MD      . tobramycin (TOBREX) 0.3 % ophthalmic solution 1 drop  1 drop Right Eye Q6H Elgergawy, Silver Huguenin, MD   1 drop at 08/06/20 0707    Allergies as of 08/05/2020 - Review Complete 08/05/2020  Allergen Reaction Noted  . Codeine Hives 04/27/2009  . Sulfonamide derivatives Hives 04/27/2009    Family History  Problem Relation Age of Onset  . Sarcoidosis Mother   . Diabetes Mother   . Cancer Mother        sarcoma -  Right arm amputation  . Hypertension Mother   . Lung cancer Father   . Diabetes Father   . Heart disease Father        Before age 38  . Urolithiasis Father   . Cancer Father        Lung  . Hyperlipidemia Father   . Hypertension Father   . Esophageal cancer Maternal Uncle   . Ovarian cancer Maternal Aunt   . Colon cancer Maternal Uncle   . Lung cancer Maternal Uncle   . Diabetes Maternal Uncle   . Diabetes Maternal Aunt   . Colon polyps Maternal Uncle   . Heart disease Paternal Grandmother     Social History   Socioeconomic History  . Marital status: Married    Spouse name: Not on file  . Number of children: 4  . Years of education: Not on file  . Highest education level: Not on file  Occupational History  . Occupation: Retired Optician, dispensing: RETIRED  Tobacco Use  . Smoking status: Never Smoker  . Smokeless tobacco: Never Used  Vaping Use  . Vaping Use: Never used  Substance and Sexual Activity  . Alcohol use: No  . Drug use: No  . Sexual activity: Not on file  Other Topics Concern  . Not on file  Social History Narrative  . Not on file   Social  Determinants of Health   Financial Resource Strain:   . Difficulty of Paying Living Expenses: Not on file  Food Insecurity:   . Worried About Charity fundraiser in the Last Year: Not on file  . Ran Out of Food in the Last Year: Not on file  Transportation Needs:   . Lack of Transportation (Medical): Not on file  . Lack of Transportation (Non-Medical): Not on file  Physical Activity:   . Days of Exercise per Week: Not on file  . Minutes of Exercise per Session: Not on file  Stress:   . Feeling of Stress : Not on file  Social Connections:   . Frequency of Communication with Friends and Family: Not on file  . Frequency of Social Gatherings with Friends and Family: Not on file  . Attends Religious Services: Not on file  . Active Member of Clubs or Organizations: Not on file  . Attends Archivist Meetings: Not on file  . Marital  Status: Not on file  Intimate Partner Violence:   . Fear of Current or Ex-Partner: Not on file  . Emotionally Abused: Not on file  . Physically Abused: Not on file  . Sexually Abused: Not on file    Review of Systems: General: Negative for anorexia, weight loss, fever, chills, fatigue, weakness. Eyes: Negative for vision changes.  ENT: Negative for hoarseness, difficulty swallowing , nasal congestion. CV: Negative for chest pain, angina, palpitations, dyspnea on exertion, peripheral edema.  Respiratory: Negative for dyspnea at rest, dyspnea on exertion, cough, sputum, wheezing.  GI: See history of present illness. GU:  Negative for dysuria, hematuria, urinary incontinence, urinary frequency, nocturnal urination.  MS: Negative for joint pain, low back pain.  Derm: Negative for rash or itching.  Neuro: Negative for weakness, abnormal sensation, seizure, frequent headaches, memory loss, confusion.  Psych: Negative for anxiety, depression, suicidal ideation, hallucinations.  Endo: Negative for unusual weight change.  Heme: Negative for bruising or  bleeding. Allergy: Negative for rash or hives.  Physical Exam: Vital signs in last 24 hours: Temp:  [97.7 F (36.5 C)-98.3 F (36.8 C)] 98.3 F (36.8 C) (10/30 0322) Pulse Rate:  [71-76] 76 (10/30 0322) Resp:  [15-25] 18 (10/30 0322) BP: (100-152)/(42-88) 126/50 (10/30 0322) SpO2:  [96 %-100 %] 96 % (10/30 0322) Weight:  [77.1 kg-80.8 kg] 80.8 kg (10/29 2246) Last BM Date: 08/05/20 General:   Alert,  Well-developed, well-nourished, pleasant and cooperative in NAD Head:  Normocephalic and atraumatic. Eyes:  Sclera clear, no icterus.   Conjunctiva pink. Ears:  Normal auditory acuity. Nose:  No deformity, discharge,  or lesions. Mouth:  No deformity or lesions, dentition normal. Neck:  Supple; no masses or thyromegaly. Lungs:  Clear throughout to auscultation.   No wheezes, crackles, or rhonchi. No acute distress. Heart:  Regular rate and rhythm; no murmurs, clicks, rubs,  or gallops. Abdomen:  Soft, nontender and nondistended. No masses, hepatosplenomegaly or hernias noted. Normal bowel sounds, without guarding, and without rebound.   Rectal:  Deferred until time of colonoscopy.   Msk:  Symmetrical without gross deformities. Normal posture. Pulses:  Normal pulses noted. Extremities:  Without clubbing or edema. Neurologic:  Alert and  oriented x4;  grossly normal neurologically. Skin:  Intact without significant lesions or rashes. Cervical Nodes:  No significant cervical adenopathy. Psych:  Alert and cooperative. Normal mood and affect.  Intake/Output from previous day: 10/29 0701 - 10/30 0700 In: 890.9 [P.O.:120; I.V.:101.7; Blood:569.2; IV Piggyback:100] Out: -  Intake/Output this shift: No intake/output data recorded.  Lab Results: Recent Labs    08/05/20 1805 08/06/20 0101 08/06/20 0547  WBC 12.6* 10.1 9.7  HGB 7.1* 8.0* 7.8*  HCT 24.1* 26.7* 26.1*  PLT 468* 399 407*   BMET Recent Labs    08/05/20 1805 08/06/20 0547  NA 130* 133*  K 3.3* 3.1*  CL 92* 96*   CO2 30 28  GLUCOSE 87 172*  BUN 22 15  CREATININE 1.02* 0.78  CALCIUM 8.8* 8.4*   LFT Recent Labs    08/05/20 1805  PROT 7.0  ALBUMIN 3.7  AST 18  ALT 13  ALKPHOS 52  BILITOT 0.6   PT/INR Recent Labs    08/05/20 1805  LABPROT 12.8  INR 1.0   Hepatitis Panel No results for input(s): HEPBSAG, HCVAB, HEPAIGM, HEPBIGM in the last 72 hours. C-Diff No results for input(s): CDIFFTOX in the last 72 hours.  Studies/Results: CT ABDOMEN PELVIS W WO CONTRAST  Result Date: 08/06/2020 CLINICAL DATA:  Left lower quadrant abdominal pain, melena, EXAM: CT ABDOMEN AND PELVIS WITHOUT AND WITH CONTRAST TECHNIQUE: Multidetector CT imaging of the abdomen and pelvis was performed following the standard protocol before and following the bolus administration of intravenous contrast. CONTRAST:  131mL OMNIPAQUE IOHEXOL 300 MG/ML  SOLN COMPARISON:  None. FINDINGS: Lower chest: The visualized lung bases are clear bilaterally. The visualized heart and pericardium are unremarkable. Moderate hiatal hernia. Hepatobiliary: Status post cholecystectomy. Liver unremarkable. No intra or extrahepatic biliary ductal dilation. Pancreas: Diffusely atrophic, but otherwise unremarkable. Spleen: Unremarkable Adrenals/Urinary Tract: Adrenal glands unremarkable. Kidneys are normal. The bladder is partially obscured by streak artifact from left total hip arthroplasty, however, the visualized portion is unremarkable peer Stomach/Bowel: Intra-abdominal stomach is unremarkable. Small and large bowel are unremarkable. No evidence of obstruction or focal inflammation. Appendix normal. No free intraperitoneal gas or fluid. Tiny fat containing umbilical hernia. Vascular/Lymphatic: Moderate aortoiliac atherosclerotic calcification. No aneurysm. No pathologic adenopathy within the abdomen and pelvis. Reproductive: Status post hysterectomy. No adnexal masses. Other: Rectum unremarkable Musculoskeletal: No lytic or blastic bone lesions are  seen. Degenerative changes are seen within the thoracolumbar spine. Left total hip arthroplasty has been performed. IMPRESSION: No radiographic explanation for the patient's reported symptoms. Incidental findings as noted above. Aortic Atherosclerosis (ICD10-I70.0). Electronically Signed   By: Fidela Salisbury MD   On: 08/06/2020 00:24   DG Chest Port 1 View  Result Date: 08/05/2020 CLINICAL DATA:  Weakness. EXAM: PORTABLE CHEST 1 VIEW COMPARISON:  02/03/2020 rib/chest radiograph and prior. FINDINGS: Mild hypoinflation. No focal consolidation, pneumothorax or pleural effusion. Cardiomediastinal silhouette is unchanged. Aortic atherosclerotic calcifications. Sequela of ACDF. IMPRESSION: No focal airspace disease. Electronically Signed   By: Primitivo Gauze M.D.   On: 08/05/2020 18:15    Impression: *Acute GI bleeding-source likely upper given melena *Acute blood loss anemia due to above *History of gastric ulcer *Intermittent NSAID use  Plan: Etiology of patient's GI bleed unclear.  Differential includes gastritis, duodenitis, Mallory-Weiss, AVMs, Dieulafoy lesion, polyps, malignancy, or other.Will proceed with EGD to further evaluate.   The risks including infection, bleed, or perforation as well as benefits, limitations, alternatives and imponderables have been reviewed with the patient. Potential for esophageal dilation, biopsy, etc. have also been reviewed.  Questions have been answered. All parties agreeable.  Continue on IV Protonix  Keep patient n.p.o. Continue to monitor hemoglobin transfuse for less than 7. Avoid NSAIDs.  Thank you for letting us take part in this patient's care.  Further recommendations to follow.  Elon Alas. Abbey Chatters, D.O. Gastroenterology and Hepatology [facility]    LOS: 1 day     08/06/2020, 8:53 AM

## 2020-08-06 NOTE — Progress Notes (Signed)
MD notified of pt request for 10 mg of melatonin. Pt states she takes it at home for insomnia

## 2020-08-07 LAB — BASIC METABOLIC PANEL
Anion gap: 9 (ref 5–15)
BUN: 9 mg/dL (ref 8–23)
CO2: 29 mmol/L (ref 22–32)
Calcium: 9 mg/dL (ref 8.9–10.3)
Chloride: 98 mmol/L (ref 98–111)
Creatinine, Ser: 0.72 mg/dL (ref 0.44–1.00)
GFR, Estimated: >60 mL/min (ref >60)
Glucose, Bld: 156 mg/dL — ABNORMAL HIGH (ref 70–99)
Potassium: 3.9 mmol/L (ref 3.5–5.1)
Sodium: 136 mmol/L (ref 135–145)

## 2020-08-07 LAB — CBC
HCT: 29.2 % — ABNORMAL LOW (ref 36.0–46.0)
Hemoglobin: 8.7 g/dL — ABNORMAL LOW (ref 12.0–15.0)
MCH: 23.9 pg — ABNORMAL LOW (ref 26.0–34.0)
MCHC: 29.8 g/dL — ABNORMAL LOW (ref 30.0–36.0)
MCV: 80.2 fL (ref 80.0–100.0)
Platelets: 437 10*3/uL — ABNORMAL HIGH (ref 150–400)
RBC: 3.64 MIL/uL — ABNORMAL LOW (ref 3.87–5.11)
RDW: 19.4 % — ABNORMAL HIGH (ref 11.5–15.5)
WBC: 10.3 10*3/uL (ref 4.0–10.5)
nRBC: 0 % (ref 0.0–0.2)

## 2020-08-07 LAB — GLUCOSE, CAPILLARY
Glucose-Capillary: 151 mg/dL — ABNORMAL HIGH (ref 70–99)
Glucose-Capillary: 201 mg/dL — ABNORMAL HIGH (ref 70–99)

## 2020-08-07 MED ORDER — PANTOPRAZOLE SODIUM 40 MG PO TBEC
40.0000 mg | DELAYED_RELEASE_TABLET | Freq: Two times a day (BID) | ORAL | 0 refills | Status: DC
Start: 2020-08-07 — End: 2022-05-22

## 2020-08-07 MED ORDER — MELATONIN 3 MG PO TABS
9.0000 mg | ORAL_TABLET | Freq: Once | ORAL | Status: AC
  Administered 2020-08-07: 9 mg via ORAL
  Filled 2020-08-07: qty 3

## 2020-08-07 NOTE — Progress Notes (Signed)
Nsg Discharge Note  Admit Date:  08/05/2020 Discharge date: 08/07/2020   Jeanne Kim to be D/C'd Home per MD order.  AVS completed.  Patient able to verbalize understanding.  Discharge Medication: Allergies as of 08/07/2020      Reactions   Codeine Hives   Sulfonamide Derivatives Hives      Medication List    STOP taking these medications   glipiZIDE 10 MG tablet Commonly known as: GLUCOTROL   naproxen sodium 220 MG tablet Commonly known as: ALEVE   NovoLOG FlexPen 100 UNIT/ML FlexPen Generic drug: insulin aspart   ondansetron 4 MG tablet Commonly known as: ZOFRAN   Trulance 3 MG Tabs Generic drug: Plecanatide     TAKE these medications   aspirin 81 MG chewable tablet Chew 81 mg by mouth daily.   atorvastatin 20 MG tablet Commonly known as: LIPITOR Take 1 tablet (20 mg total) by mouth daily.   CALCIUM-MAGNESIUM-ZINC PO Take 3 tablets by mouth daily.   Co Q-10 100 MG Caps Take 100 mg by mouth daily.   cyanocobalamin 1000 MCG/ML injection Commonly known as: (VITAMIN B-12) INJECT 1 ML INTO MUSCLE ONCE A MONTH   doxepin 10 MG capsule Commonly known as: SINEQUAN Take by mouth.   DULoxetine 60 MG capsule Commonly known as: CYMBALTA Take 60 mg by mouth daily.   EPINEPHrine 0.3 mg/0.3 mL Soaj injection Commonly known as: EPI-PEN Frequency:PHARMDIR   Dosage:0.0     Instructions:  Note:self inject in the event of allergic reaction, then proceed immediately to the nearest Emergency Room. Dose: 1 INJECTION   fentaNYL 25 MCG/HR Commonly known as: DURAGESIC Place 25 mcg onto the skin every 3 (three) days. Notes to patient: 08/09/2020   fexofenadine 30 MG tablet Commonly known as: ALLEGRA Take 30 mg by mouth daily.   FISH OIL PO Take by mouth.   fluconazole 150 MG tablet Commonly known as: DIFLUCAN Take 150 mg by mouth once.   fluticasone 50 MCG/ACT nasal spray Commonly known as: FLONASE Place 2 sprays into the nose daily.   furosemide 40 MG  tablet Commonly known as: LASIX Take 1 tablet (40 mg total) by mouth 3 (three) times a week. Take on Monday/Wednesday/Friday   gabapentin 300 MG capsule Commonly known as: NEURONTIN Take 300 mg by mouth at bedtime.   gentamicin 0.3 % ophthalmic solution Commonly known as: GARAMYCIN 1 drop 3 (three) times daily.   glyBURIDE 5 MG tablet Commonly known as: DIABETA Take 5 mg by mouth daily.   lidocaine 5 % Commonly known as: LIDODERM Place 1 patch onto the skin daily. Remove & Discard patch within 12 hours or as directed by MD   Linzess 290 MCG Caps capsule Generic drug: linaclotide TAKE 1 CAPSULE BY MOUTH EVERY DAY   Melatonin 10 MG Tabs Take by mouth.   metFORMIN 1000 MG tablet Commonly known as: GLUCOPHAGE Take 1,000 mg by mouth 2 (two) times daily with a meal.   metolazone 5 MG tablet Commonly known as: ZAROXOLYN Take 5 mg by mouth daily.   metoprolol succinate 50 MG 24 hr tablet Commonly known as: TOPROL-XL TAKE 1 TABLET BY MOUTH EVERY DAY   midodrine 10 MG tablet Commonly known as: PROAMATINE   montelukast 10 MG tablet Commonly known as: SINGULAIR Take 10 mg by mouth at bedtime.   Needles & Syringes Misc 25G 5/8 inch needle on 3 ml syringe-Use once monthly for B12 injections   nitroGLYCERIN 0.4 MG SL tablet Commonly known as: NITROSTAT PLACE 1 TABLET (  0.4 MG TOTAL) UNDER THE TONGUE EVERY 5 (FIVE) MINUTES AS NEEDED FOR CHEST PAIN.   OneTouch Verio test strip Generic drug: glucose blood 1 each by Other route as directed.   pantoprazole 40 MG tablet Commonly known as: PROTONIX Take 1 tablet (40 mg total) by mouth 2 (two) times daily.   potassium chloride 10 MEQ tablet Commonly known as: KLOR-CON Take 2 tablets (20 mEq total) by mouth 3 (three) times a week.   ramipril 5 MG capsule Commonly known as: ALTACE Take 1 capsule (5 mg total) by mouth at bedtime.   senna-docusate 8.6-50 MG tablet Commonly known as: Senokot-S Take by mouth.   Soliqua  100-33 UNT-MCG/ML Sopn Generic drug: Insulin Glargine-Lixisenatide Inject 20 Units into the skin. Pt adjust between 20-25 units   Synthroid 137 MCG tablet Generic drug: levothyroxine Take 137 mcg by mouth daily.   tiZANidine 4 MG tablet Commonly known as: ZANAFLEX Take 4 mg by mouth 2 (two) times daily.   traMADol 50 MG tablet Commonly known as: ULTRAM Take by mouth.       Discharge Assessment: Vitals:   08/06/20 2233 08/07/20 0500  BP: (!) 121/52 (!) 127/50  Pulse: 81   Resp:  17  Temp: 98.4 F (36.9 C) 99 F (37.2 C)  SpO2: 95% 96%   Skin clean, dry and intact without evidence of skin break down, no evidence of skin tears noted. IV catheter discontinued intact. Site without signs and symptoms of complications - no redness or edema noted at insertion site, patient denies c/o pain - only slight tenderness at site.  Dressing with slight pressure applied.  D/c Instructions-Education: Discharge instructions given to patient with verbalized understanding. D/c education completed with patient including follow up instructions, medication list, d/c activities limitations if indicated, with other d/c instructions as indicated by MD - patient able to verbalize understanding, all questions fully answered. Patient instructed to return to ED, call 911, or call MD for any changes in condition.  Patient escorted via [place], and D/C home via private auto.  [name], RN 08/07/2020 2:12 PM

## 2020-08-07 NOTE — Discharge Summary (Signed)
Physician Discharge Summary  Jeanne Kim LEX:[id number] DOB: 07-Jun-1941 DOA: 08/05/2020  PCP: Neale Burly, MD  Admit date: 08/05/2020 Discharge date: 08/07/2020  Admitted From: Home Disposition: Home  Recommendations for Outpatient Follow-up:  1. Follow up with PCP in 1 week 2. Please obtain BMP/CBC in one week 3. PPI BID x 8 weeks 4. Outpatient GI follow-up in 8 weeks for repeat EGD 5. Please follow up on the following pending results: EGD surgical pathology  Home Health: None Equipment/Devices: None  Discharge Condition: Stable CODE STATUS: Full code Diet recommendation: Heart healthy   Brief/Interim Summary:  Admission HPI written by Albertine Patricia, MD   HPI:   Jeanne Kim  is a 79 y.o. female, past medical history of hypertension, CAD, hypothyroidism, breast cancer, fibromyalgia, irritable bowel syndrome, hypothyroidism, diabetes mellitus ED secondary to melena, patient with history of irritable bowel syndrome, most recent endoscopy 2017 with evidence of gastric ulcer, colonoscopy 2018 with internal hemorrhoids and diverticulosis, patient presents to ED secondary to melena over last 3 days, where she had 2 episodes, no bowel movement today, as well she does report left lower quadrant pain over the past week or so, she denies any fever, chills, nausea, vomiting, no bright red blood per rectum, no coffee-ground emesis, as well patient report generalized weakness and poor appetite over last few days, she thought that might be related to UTI for which she has been taking Keflex for last 5 days, she denies any further dysuria or polyuria, but she did have some hematuria initially which currently resolved, she denies any chest pain, shortness of breath.  Patient reports she is on aspirin, and taking naproxen infrequently, she only took 2 tablets over last 10 days.   Hospital course:  Symptomatic anemia Acute blood loss anemia Baseline hemoglobin of 9 from  September 2021. Hemoglobin of 1.1 on admission secondary to blood loss from GI bleeding. Patient received 1 unit of PRBC. Hemoglobin rebound to 8 and stable at 7.8. Hemoglobin of 8.7 on day of discharge.  Upper GI bleed Gastric ulcers History of gastric ulcers and associated melena. EGD significant for multiple, clean base ulcers without evidence of recent bleeding. Surgical biopsy obtained. Recommendations for Protonix 40 mg BID x8 weeks with plan for repeat EGD in 8 weeks  History of CAD On aspirin as an outpatient. Held secondary to above. Also on Toprol XL. Continue Toprol XL  Essential hypertension Patient is on ramipril and Toprol XL as an outpatient. Continue Toprol XL  Fibromyalgia Chronic pain syndrome Patient is on Fentanyl patch, gabapentin as an outpatient. Continue regimen  Irritable bowel syndrome Continue linzess  Diabetes mellitus, type 2 Patient is on Lantus, metformin, glipizide, glyburide and Novolog. Clarified and patient is currently not on glipizide and this has been discontinued from medication list. Continue home regimen. Discontinue Novolog as well.  Hyperlipidemia Patient was on a statin for which she has stopped taking. Outpatient follow-up.  Hypokalemia Mild. Given potassium in IV fluids in addition to oral supplementation. Resolved.  Hyponatremia Mild. Improved with IV fluids. Can recheck BMP as an outpatient.  History of UTI Completed course outpatient.  Hypothyroidism Continue Synthroid  Possible orthostatic hypotension Patient is on midodrine for lightheadedness when getting out of bed in the morning. This is a chronic medication for which she takes in addition to antihypertensives. Continue home regimen of midodrine  Discharge Diagnoses:  Active Problems:   HYPERCHOLESTEROLEMIA  IIA   Essential hypertension   Coronary atherosclerosis   Esophageal reflux  IBS (irritable bowel syndrome)   DM (diabetes mellitus) ([place])    Diverticulosis of colon   Adult hypothyroidism   GI bleed    Discharge Instructions   Allergies as of 08/07/2020      Reactions   Codeine Hives   Sulfonamide Derivatives Hives      Medication List    STOP taking these medications   glipiZIDE 10 MG tablet Commonly known as: GLUCOTROL   naproxen sodium 220 MG tablet Commonly known as: ALEVE   NovoLOG FlexPen 100 UNIT/ML FlexPen Generic drug: insulin aspart   ondansetron 4 MG tablet Commonly known as: ZOFRAN   Trulance 3 MG Tabs Generic drug: Plecanatide     TAKE these medications   aspirin 81 MG chewable tablet Chew 81 mg by mouth daily.   atorvastatin 20 MG tablet Commonly known as: LIPITOR Take 1 tablet (20 mg total) by mouth daily.   CALCIUM-MAGNESIUM-ZINC PO Take 3 tablets by mouth daily.   Co Q-10 100 MG Caps Take 100 mg by mouth daily.   cyanocobalamin 1000 MCG/ML injection Commonly known as: (VITAMIN B-12) INJECT 1 ML INTO MUSCLE ONCE A MONTH   doxepin 10 MG capsule Commonly known as: SINEQUAN Take by mouth.   DULoxetine 60 MG capsule Commonly known as: CYMBALTA Take 60 mg by mouth daily.   EPINEPHrine 0.3 mg/0.3 mL Soaj injection Commonly known as: EPI-PEN Frequency:PHARMDIR   Dosage:0.0     Instructions:  Note:self inject in the event of allergic reaction, then proceed immediately to the nearest Emergency Room. Dose: 1 INJECTION   fentaNYL 25 MCG/HR Commonly known as: DURAGESIC Place 25 mcg onto the skin every 3 (three) days. Notes to patient: 08/09/2020   fexofenadine 30 MG tablet Commonly known as: ALLEGRA Take 30 mg by mouth daily.   FISH OIL PO Take by mouth.   fluconazole 150 MG tablet Commonly known as: DIFLUCAN Take 150 mg by mouth once.   fluticasone 50 MCG/ACT nasal spray Commonly known as: FLONASE Place 2 sprays into the nose daily.   furosemide 40 MG tablet Commonly known as: LASIX Take 1 tablet (40 mg total) by mouth 3 (three) times a week. Take on  Monday/Wednesday/Friday   gabapentin 300 MG capsule Commonly known as: NEURONTIN Take 300 mg by mouth at bedtime.   gentamicin 0.3 % ophthalmic solution Commonly known as: GARAMYCIN 1 drop 3 (three) times daily.   glyBURIDE 5 MG tablet Commonly known as: DIABETA Take 5 mg by mouth daily.   lidocaine 5 % Commonly known as: LIDODERM Place 1 patch onto the skin daily. Remove & Discard patch within 12 hours or as directed by MD   Linzess 290 MCG Caps capsule Generic drug: linaclotide TAKE 1 CAPSULE BY MOUTH EVERY DAY   Melatonin 10 MG Tabs Take by mouth.   metFORMIN 1000 MG tablet Commonly known as: GLUCOPHAGE Take 1,000 mg by mouth 2 (two) times daily with a meal.   metolazone 5 MG tablet Commonly known as: ZAROXOLYN Take 5 mg by mouth daily.   metoprolol succinate 50 MG 24 hr tablet Commonly known as: TOPROL-XL TAKE 1 TABLET BY MOUTH EVERY DAY   midodrine 10 MG tablet Commonly known as: PROAMATINE   montelukast 10 MG tablet Commonly known as: SINGULAIR Take 10 mg by mouth at bedtime.   Needles & Syringes Misc 25G 5/8 inch needle on 3 ml syringe-Use once monthly for B12 injections   nitroGLYCERIN 0.4 MG SL tablet Commonly known as: NITROSTAT PLACE 1 TABLET (0.4 MG TOTAL) UNDER  THE TONGUE EVERY 5 (FIVE) MINUTES AS NEEDED FOR CHEST PAIN.   OneTouch Verio test strip Generic drug: glucose blood 1 each by Other route as directed.   pantoprazole 40 MG tablet Commonly known as: PROTONIX Take 1 tablet (40 mg total) by mouth 2 (two) times daily.   potassium chloride 10 MEQ tablet Commonly known as: KLOR-CON Take 2 tablets (20 mEq total) by mouth 3 (three) times a week.   ramipril 5 MG capsule Commonly known as: ALTACE Take 1 capsule (5 mg total) by mouth at bedtime.   senna-docusate 8.6-50 MG tablet Commonly known as: Senokot-S Take by mouth.   Soliqua 100-33 UNT-MCG/ML Sopn Generic drug: Insulin Glargine-Lixisenatide Inject 20 Units into the skin. Pt  adjust between 20-25 units   Synthroid 137 MCG tablet Generic drug: levothyroxine Take 137 mcg by mouth daily.   tiZANidine 4 MG tablet Commonly known as: ZANAFLEX Take 4 mg by mouth 2 (two) times daily.   traMADol 50 MG tablet Commonly known as: ULTRAM Take by mouth.       Allergies  Allergen Reactions  . Codeine Hives  . Sulfonamide Derivatives Hives    Consultations:  Gastroenterology   Procedures/Studies: CT ABDOMEN PELVIS W WO CONTRAST  Result Date: 08/06/2020 CLINICAL DATA:  Left lower quadrant abdominal pain, melena, EXAM: CT ABDOMEN AND PELVIS WITHOUT AND WITH CONTRAST TECHNIQUE: Multidetector CT imaging of the abdomen and pelvis was performed following the standard protocol before and following the bolus administration of intravenous contrast. CONTRAST:  143mL OMNIPAQUE IOHEXOL 300 MG/ML  SOLN COMPARISON:  None. FINDINGS: Lower chest: The visualized lung bases are clear bilaterally. The visualized heart and pericardium are unremarkable. Moderate hiatal hernia. Hepatobiliary: Status post cholecystectomy. Liver unremarkable. No intra or extrahepatic biliary ductal dilation. Pancreas: Diffusely atrophic, but otherwise unremarkable. Spleen: Unremarkable Adrenals/Urinary Tract: Adrenal glands unremarkable. Kidneys are normal. The bladder is partially obscured by streak artifact from left total hip arthroplasty, however, the visualized portion is unremarkable peer Stomach/Bowel: Intra-abdominal stomach is unremarkable. Small and large bowel are unremarkable. No evidence of obstruction or focal inflammation. Appendix normal. No free intraperitoneal gas or fluid. Tiny fat containing umbilical hernia. Vascular/Lymphatic: Moderate aortoiliac atherosclerotic calcification. No aneurysm. No pathologic adenopathy within the abdomen and pelvis. Reproductive: Status post hysterectomy. No adnexal masses. Other: Rectum unremarkable Musculoskeletal: No lytic or blastic bone lesions are seen.  Degenerative changes are seen within the thoracolumbar spine. Left total hip arthroplasty has been performed. IMPRESSION: No radiographic explanation for the patient's reported symptoms. Incidental findings as noted above. Aortic Atherosclerosis (ICD10-I70.0). Electronically Signed   By: Fidela Salisbury MD   On: 08/06/2020 00:24   DG Chest Port 1 View  Result Date: 08/05/2020 CLINICAL DATA:  Weakness. EXAM: PORTABLE CHEST 1 VIEW COMPARISON:  02/03/2020 rib/chest radiograph and prior. FINDINGS: Mild hypoinflation. No focal consolidation, pneumothorax or pleural effusion. Cardiomediastinal silhouette is unchanged. Aortic atherosclerotic calcifications. Sequela of ACDF. IMPRESSION: No focal airspace disease. Electronically Signed   By: Primitivo Gauze M.D.   On: 08/05/2020 18:15     UPPER ENDOSCOPY (08/06/2020) Findings:      LA Grade A (one or more mucosal breaks less than 5 mm, not extending       between tops of 2 mucosal folds) esophagitis with no bleeding was found       at the gastroesophageal junction.      Multiple non-bleeding cratered gastric ulcers with no stigmata of       bleeding were found in the gastric body. Biopsies were taken  with a cold       forceps for Helicobacter pylori testing.      The duodenal bulb, first portion of the duodenum and second portion of       the duodenum were normal.      A medium-sized hiatal hernia was present. Impression:               - LA Grade A reflux esophagitis with no bleeding.                           - Non-bleeding gastric ulcers with no stigmata of                            bleeding. Biopsied.                           - Normal duodenal bulb, first portion of the                            duodenum and second portion of the duodenum.                           - Medium-sized hiatal hernia.  Recommendation:           - Return patient to hospital ward for ongoing care.                           - Soft diet.                           -  Continue on IV Protonix 40 mg BID while                            inpatient. She will need to be discharged on PPI                            such as Omeprazole of Pantoprazole 40 mg BID.                           - Repeat upper endoscopy in 8 weeks to evaluate the                            response to therapy.   Subjective: Recurrent melena reported as black and mucous.  Discharge Exam: Vitals:   08/06/20 2233 08/07/20 0500  BP: (!) 121/52 (!) 127/50  Pulse: 81   Resp:  17  Temp: 98.4 F (36.9 C) 99 F (37.2 C)  SpO2: 95% 96%   Vitals:   08/06/20 1100 08/06/20 1347 08/06/20 2233 08/07/20 0500  BP: (!) 109/42 127/80 (!) 121/52 (!) 127/50  Pulse: 73 (!) 111 81   Resp: 19 18  17   Temp:  98.2 F (36.8 C) 98.4 F (36.9 C) 99 F (37.2 C)  TempSrc:   Oral Oral  SpO2: 97% 99% 95% 96%  Weight:      Height:        General exam: Appears calm and comfortable  Respiratory system: Clear to auscultation. Respiratory effort normal.  Cardiovascular system: S1 & S2 heard, RRR. No murmurs, rubs, gallops or clicks. Gastrointestinal system: Abdomen is nondistended, soft and nontender. No organomegaly or masses felt. Normal bowel sounds heard. Central nervous system: Alert and oriented. No focal neurological deficits. Musculoskeletal: No edema. No calf tenderness Skin: No cyanosis. No rashes Psychiatry: Judgement and insight appear normal. Mood & affect appropriate.   The results of significant diagnostics from this hospitalization (including imaging, microbiology, ancillary and laboratory) are listed below for reference.     Microbiology: Recent Results (from the past 240 hour(s))  Resp Panel by RT PCR (RSV, Flu A&B, Covid) - Nasopharyngeal Swab     Status: None   Collection Time: 08/05/20  6:43 PM   Specimen: Nasopharyngeal Swab  Result Value Ref Range Status   SARS Coronavirus 2 by RT PCR NEGATIVE NEGATIVE Final    Comment: (NOTE) SARS-CoV-2 target nucleic acids are NOT  DETECTED.  The SARS-CoV-2 RNA is generally detectable in upper respiratoy specimens during the acute phase of infection. The lowest concentration of SARS-CoV-2 viral copies this assay can detect is 131 copies/mL. A negative result does not preclude SARS-Cov-2 infection and should not be used as the sole basis for treatment or other patient management decisions. A negative result may occur with  improper specimen collection/handling, submission of specimen other than nasopharyngeal swab, presence of viral mutation(s) within the areas targeted by this assay, and inadequate number of viral copies (<131 copies/mL). A negative result must be combined with clinical observations, patient history, and epidemiological information. The expected result is Negative.  Fact Sheet for Patients:  PinkCheek.be  Fact Sheet for Healthcare Providers:  GravelBags.it  This test is no t yet approved or cleared by the [place] FDA and  has been authorized for detection and/or diagnosis of SARS-CoV-2 by FDA under an Emergency Use Authorization (EUA). This EUA will remain  in effect (meaning this test can be used) for the duration of the COVID-19 declaration under Section 564(b)(1) of the Act, 21 U.S.C. section 360bbb-3(b)(1), unless the authorization is terminated or revoked sooner.     Influenza A by PCR NEGATIVE NEGATIVE Final   Influenza B by PCR NEGATIVE NEGATIVE Final    Comment: (NOTE) The Xpert Xpress SARS-CoV-2/FLU/RSV assay is intended as an aid in  the diagnosis of influenza from Nasopharyngeal swab specimens and  should not be used as a sole basis for treatment. Nasal washings and  aspirates are unacceptable for Xpert Xpress SARS-CoV-2/FLU/RSV  testing.  Fact Sheet for Patients: PinkCheek.be  Fact Sheet for Healthcare Providers: GravelBags.it  This test is not yet  approved or cleared by the [place] FDA and  has been authorized for detection and/or diagnosis of SARS-CoV-2 by  FDA under an Emergency Use Authorization (EUA). This EUA will remain  in effect (meaning this test can be used) for the duration of the  Covid-19 declaration under Section 564(b)(1) of the Act, 21  U.S.C. section 360bbb-3(b)(1), unless the authorization is  terminated or revoked.    Respiratory Syncytial Virus by PCR NEGATIVE NEGATIVE Final    Comment: (NOTE) Fact Sheet for Patients: PinkCheek.be  Fact Sheet for Healthcare Providers: GravelBags.it  This test is not yet approved or cleared by the [place] FDA and  has been authorized for detection and/or diagnosis of SARS-CoV-2 by  FDA under an Emergency Use Authorization (EUA). This EUA will remain  in effect (meaning this test can be used) for the duration of the  COVID-19 declaration under Section 564(b)(1) of the Act,  21 U.S.C.  section 360bbb-3(b)(1), unless the authorization is terminated or  revoked. Performed at [facility], [address] [place],  [address]      Labs: BNP (last 3 results) Recent Labs    08/05/20 1806  BNP 09.9   Basic Metabolic Panel: Recent Labs  Lab 08/05/20 1805 08/06/20 0547 08/07/20 0753  NA 130* 133* 136  K 3.3* 3.1* 3.9  CL 92* 96* 98  CO2 30 28 29   GLUCOSE 87 172* 156*  BUN 22 15 9   CREATININE 1.02* 0.78 0.72  CALCIUM 8.8* 8.4* 9.0   Liver Function Tests: Recent Labs  Lab 08/05/20 1805  AST 18  ALT 13  ALKPHOS 52  BILITOT 0.6  PROT 7.0  ALBUMIN 3.7   Recent Labs  Lab 08/05/20 1805  LIPASE 16   No results for input(s): AMMONIA in the last 168 hours. CBC: Recent Labs  Lab 08/05/20 1805 08/06/20 0101 08/06/20 0547 08/07/20 0753  WBC 12.6* 10.1 9.7 10.3  NEUTROABS 6.4  --   --   --   HGB 7.1* 8.0* 7.8* 8.7*  HCT 24.1* 26.7* 26.1* 29.2*  MCV 77.5* 79.9* 80.3 80.2  PLT 468*  399 407* 437*   Cardiac Enzymes: No results for input(s): CKTOTAL, CKMB, CKMBINDEX, TROPONINI in the last 168 hours. BNP: Invalid input(s): POCBNP CBG: Recent Labs  Lab 08/06/20 0204 08/06/20 1153 08/06/20 1646 08/06/20 2211 08/07/20 0729  GLUCAP 77 132* 239* 271* 151*   D-Dimer No results for input(s): DDIMER in the last 72 hours. Hgb A1c Recent Labs    08/05/20 1805  HGBA1C 7.4*   Lipid Profile No results for input(s): CHOL, HDL, LDLCALC, TRIG, CHOLHDL, LDLDIRECT in the last 72 hours. Thyroid function studies No results for input(s): TSH, T4TOTAL, T3FREE, THYROIDAB in the last 72 hours.  Invalid input(s): FREET3 Anemia work up No results for input(s): VITAMINB12, FOLATE, FERRITIN, TIBC, IRON, RETICCTPCT in the last 72 hours. Urinalysis No results found for: COLORURINE, APPEARANCEUR, [place], [place], [place], [place], [place], [place], PROTEINUR, UROBILINOGEN, NITRITE, LEUKOCYTESUR Sepsis Labs Invalid input(s): PROCALCITONIN,  WBC,  LACTICIDVEN Microbiology Recent Results (from the past 240 hour(s))  Resp Panel by RT PCR (RSV, Flu A&B, Covid) - Nasopharyngeal Swab     Status: None   Collection Time: 08/05/20  6:43 PM   Specimen: Nasopharyngeal Swab  Result Value Ref Range Status   SARS Coronavirus 2 by RT PCR NEGATIVE NEGATIVE Final    Comment: (NOTE) SARS-CoV-2 target nucleic acids are NOT DETECTED.  The SARS-CoV-2 RNA is generally detectable in upper respiratoy specimens during the acute phase of infection. The lowest concentration of SARS-CoV-2 viral copies this assay can detect is 131 copies/mL. A negative result does not preclude SARS-Cov-2 infection and should not be used as the sole basis for treatment or other patient management decisions. A negative result may occur with  improper specimen collection/handling, submission of specimen other than nasopharyngeal swab, presence of viral mutation(s) within the areas targeted by this assay, and  inadequate number of viral copies (<131 copies/mL). A negative result must be combined with clinical observations, patient history, and epidemiological information. The expected result is Negative.  Fact Sheet for Patients:  PinkCheek.be  Fact Sheet for Healthcare Providers:  GravelBags.it  This test is no t yet approved or cleared by the [place] FDA and  has been authorized for detection and/or diagnosis of SARS-CoV-2 by FDA under an Emergency Use Authorization (EUA). This EUA will remain  in effect (meaning this test can be used)  for the duration of the COVID-19 declaration under Section 564(b)(1) of the Act, 21 U.S.C. section 360bbb-3(b)(1), unless the authorization is terminated or revoked sooner.     Influenza A by PCR NEGATIVE NEGATIVE Final   Influenza B by PCR NEGATIVE NEGATIVE Final    Comment: (NOTE) The Xpert Xpress SARS-CoV-2/FLU/RSV assay is intended as an aid in  the diagnosis of influenza from Nasopharyngeal swab specimens and  should not be used as a sole basis for treatment. Nasal washings and  aspirates are unacceptable for Xpert Xpress SARS-CoV-2/FLU/RSV  testing.  Fact Sheet for Patients: PinkCheek.be  Fact Sheet for Healthcare Providers: GravelBags.it  This test is not yet approved or cleared by the [place] FDA and  has been authorized for detection and/or diagnosis of SARS-CoV-2 by  FDA under an Emergency Use Authorization (EUA). This EUA will remain  in effect (meaning this test can be used) for the duration of the  Covid-19 declaration under Section 564(b)(1) of the Act, 21  U.S.C. section 360bbb-3(b)(1), unless the authorization is  terminated or revoked.    Respiratory Syncytial Virus by PCR NEGATIVE NEGATIVE Final    Comment: (NOTE) Fact Sheet for Patients: PinkCheek.be  Fact Sheet for  Healthcare Providers: GravelBags.it  This test is not yet approved or cleared by the [place] FDA and  has been authorized for detection and/or diagnosis of SARS-CoV-2 by  FDA under an Emergency Use Authorization (EUA). This EUA will remain  in effect (meaning this test can be used) for the duration of the  COVID-19 declaration under Section 564(b)(1) of the Act, 21 U.S.C.  section 360bbb-3(b)(1), unless the authorization is terminated or  revoked. Performed at [facility], [address] [place], [place] [address]      Time coordinating discharge: 35 minutes  SIGNED:   Cordelia Poche, MD [facility] 08/07/2020, 9:12 AM

## 2020-08-07 NOTE — Discharge Instructions (Signed)
Jeanne Kim,  You were in the hospital because of bleeding from your GI system. You had an EGD which should multiple stomach ulcers. You will be started on a proton pump inhibitor to decrease the acid levels in your stomach. You can continue your baby aspirin. Please follow-up with the GI doctor as recommended and call sooner if your black stools persist for the next few days to discuss next steps.

## 2020-08-07 NOTE — Progress Notes (Signed)
Subjective: Patient underwent EGD yesterday which revealed 3-4 small clean-based ulcers.  No active bleeding or stigmata of bleeding.  Per reports had one episode of melena.  Hemoglobin stable.  Patient has no complaints for me today.  She is inquiring if she can go home.  Objective: Vital signs in last 24 hours: Temp:  [98.2 F (36.8 C)-99 F (37.2 C)] 99 F (37.2 C) (10/31 0500) Pulse Rate:  [81-111] 81 (10/30 2233) Resp:  [17-18] 17 (10/31 0500) BP: (121-127)/(50-80) 127/50 (10/31 0500) SpO2:  [95 %-99 %] 96 % (10/31 0500) Last BM Date: 08/06/20 General:   Alert and oriented, pleasant Head:  Normocephalic and atraumatic. Eyes:  No icterus, sclera clear. Conjuctiva pink.  Mouth:  Without lesions, mucosa pink and moist.  Neck:  Supple, without thyromegaly or masses.  Heart:  S1, S2 present, no murmurs noted.  Lungs: Clear to auscultation bilaterally, without wheezing, rales, or rhonchi.  Abdomen:  Bowel sounds present, soft, non-tender, non-distended. No HSM or hernias noted. No rebound or guarding. No masses appreciated  Msk:  Symmetrical without gross deformities. Normal posture. Pulses:  Normal pulses noted. Extremities:  Without clubbing or edema. Neurologic:  Alert and  oriented x4;  grossly normal neurologically. Skin:  Warm and dry, intact without significant lesions.  Cervical Nodes:  No significant cervical adenopathy. Psych:  Alert and cooperative. Normal mood and affect.  Intake/Output from previous day: 10/30 0701 - 10/31 0700 In: 469 [P.O.:290; I.V.:179] Out: -  Intake/Output this shift: No intake/output data recorded.  Lab Results: Recent Labs    08/06/20 0101 08/06/20 0547 08/07/20 0753  WBC 10.1 9.7 10.3  HGB 8.0* 7.8* 8.7*  HCT 26.7* 26.1* 29.2*  PLT 399 407* 437*   BMET Recent Labs    08/05/20 1805 08/06/20 0547 08/07/20 0753  NA 130* 133* 136  K 3.3* 3.1* 3.9  CL 92* 96* 98  CO2 30 28 29   GLUCOSE 87 172* 156*  BUN 22 15 9   CREATININE  1.02* 0.78 0.72  CALCIUM 8.8* 8.4* 9.0   LFT Recent Labs    08/05/20 1805  PROT 7.0  ALBUMIN 3.7  AST 18  ALT 13  ALKPHOS 52  BILITOT 0.6   PT/INR Recent Labs    08/05/20 1805  LABPROT 12.8  INR 1.0   Hepatitis Panel No results for input(s): HEPBSAG, HCVAB, HEPAIGM, HEPBIGM in the last 72 hours.   Studies/Results: CT ABDOMEN PELVIS W WO CONTRAST  Result Date: 08/06/2020 CLINICAL DATA:  Left lower quadrant abdominal pain, melena, EXAM: CT ABDOMEN AND PELVIS WITHOUT AND WITH CONTRAST TECHNIQUE: Multidetector CT imaging of the abdomen and pelvis was performed following the standard protocol before and following the bolus administration of intravenous contrast. CONTRAST:  160mL OMNIPAQUE IOHEXOL 300 MG/ML  SOLN COMPARISON:  None. FINDINGS: Lower chest: The visualized lung bases are clear bilaterally. The visualized heart and pericardium are unremarkable. Moderate hiatal hernia. Hepatobiliary: Status post cholecystectomy. Liver unremarkable. No intra or extrahepatic biliary ductal dilation. Pancreas: Diffusely atrophic, but otherwise unremarkable. Spleen: Unremarkable Adrenals/Urinary Tract: Adrenal glands unremarkable. Kidneys are normal. The bladder is partially obscured by streak artifact from left total hip arthroplasty, however, the visualized portion is unremarkable peer Stomach/Bowel: Intra-abdominal stomach is unremarkable. Small and large bowel are unremarkable. No evidence of obstruction or focal inflammation. Appendix normal. No free intraperitoneal gas or fluid. Tiny fat containing umbilical hernia. Vascular/Lymphatic: Moderate aortoiliac atherosclerotic calcification. No aneurysm. No pathologic adenopathy within the abdomen and pelvis. Reproductive: Status post hysterectomy. No adnexal masses. Other:  Rectum unremarkable Musculoskeletal: No lytic or blastic bone lesions are seen. Degenerative changes are seen within the thoracolumbar spine. Left total hip arthroplasty has been  performed. IMPRESSION: No radiographic explanation for the patient's reported symptoms. Incidental findings as noted above. Aortic Atherosclerosis (ICD10-I70.0). Electronically Signed   By: Fidela Salisbury MD   On: 08/06/2020 00:24   DG Chest Port 1 View  Result Date: 08/05/2020 CLINICAL DATA:  Weakness. EXAM: PORTABLE CHEST 1 VIEW COMPARISON:  02/03/2020 rib/chest radiograph and prior. FINDINGS: Mild hypoinflation. No focal consolidation, pneumothorax or pleural effusion. Cardiomediastinal silhouette is unchanged. Aortic atherosclerotic calcifications. Sequela of ACDF. IMPRESSION: No focal airspace disease. Electronically Signed   By: Primitivo Gauze M.D.   On: 08/05/2020 18:15    Impression: *Acute GI bleeding-source likely multiple small clean based ulcers *Acute blood loss anemia due to above *Intermittent NSAID use  Plan: EGD 08/06/2020 revealed 3-4 small gastric ulcers, clean base, no active bleeding or stigmata of bleeding.  No intervention required.  Patient will need to be discharged home on PPI twice daily for the next 8 weeks.  We will plan on repeat EGD at that time which I will arrange.  Avoid NSAIDs.  Await biopsy results.  I will contact patient and treat for H. pylori if positive.  Thank you for letting us take part in this patient's care.  GI to sign off, please call with any questions/concerns  Greig Altergott K. Abbey Chatters, D.O. Gastroenterology and Hepatology [facility]   LOS: 2 days    08/07/2020, 11:13 AM

## 2020-08-08 ENCOUNTER — Encounter: Payer: Self-pay | Admitting: Internal Medicine

## 2020-08-08 ENCOUNTER — Telehealth: Payer: Self-pay

## 2020-08-08 ENCOUNTER — Encounter (HOSPITAL_COMMUNITY): Payer: Self-pay | Admitting: Internal Medicine

## 2020-08-08 LAB — GLUCOSE, CAPILLARY: Glucose-Capillary: 144 mg/dL — ABNORMAL HIGH (ref 70–99)

## 2020-08-08 NOTE — Telephone Encounter (Signed)
-----   Message from Oleh Genin, [place] sent at 08/08/2020  7:56 AM EDT ----- Patient will need repeat EGD with me in approximately 8 weeks.  Can we set this up.  ASA 3.  Thank you!  Abbey Chatters

## 2020-08-08 NOTE — Telephone Encounter (Signed)
PATIENT SCHEDULED AND LETTER SENT  °

## 2020-08-08 NOTE — Telephone Encounter (Signed)
Please schedule OV for hospital f/u and schedule repeat EGD.

## 2020-08-09 ENCOUNTER — Ambulatory Visit (HOSPITAL_COMMUNITY)
Admission: RE | Admit: 2020-08-09 | Discharge: 2020-08-09 | Disposition: A | Discharge status: Home / Self Care | Payer: Medicare HMO | Source: Ambulatory Visit | Attending: Cardiovascular Disease | Admitting: Cardiovascular Disease

## 2020-08-09 ENCOUNTER — Other Ambulatory Visit: Payer: Self-pay

## 2020-08-09 DIAGNOSIS — M7989 Other specified soft tissue disorders: Secondary | ICD-10-CM | POA: Insufficient documentation

## 2020-08-09 LAB — SURGICAL PATHOLOGY

## 2020-09-23 ENCOUNTER — Telehealth: Payer: Self-pay | Admitting: Gastroenterology

## 2020-09-23 NOTE — Telephone Encounter (Signed)
Spoke with the patient. She is calling because her Linzess does not help her have daily bowel movements anymore. She noticed this ''about a month now." She does not skip doses. She takes this same time every morning as directed. She has a bowel movement weekly. Feels bloated. Denies any abdominal pain. Maybe nausea , but not much. She has not taken any stool softeners. Patient was hospitalized at [facility] for rectal bleeding. She had an EGD and was told she had ulcers. She is on BID Protonix now. She has a follow up appointment scheduled with [facility].  Not clear that she wants to change practices. She seems a little confused about that.  Patient is also scheduled for an appointment here. Encouraged her to chose a GI practice that is the easiest to get to and that [facility] are glad to take care of her.  She will in the meanwhile add back Senokot to her current daily regimen. She will call back next week with an update.

## 2020-09-23 NOTE — Telephone Encounter (Signed)
Patient called states she is having issues with the Linzess medication

## 2020-09-27 ENCOUNTER — Emergency Department (HOSPITAL_COMMUNITY): Payer: Medicare HMO

## 2020-09-27 ENCOUNTER — Observation Stay (HOSPITAL_COMMUNITY)
Admission: EM | Admit: 2020-09-27 | Discharge: 2020-09-29 | Disposition: A | Discharge status: Home / Self Care | Payer: Medicare HMO | Attending: Internal Medicine | Admitting: Internal Medicine

## 2020-09-27 DIAGNOSIS — Z7984 Long term (current) use of oral hypoglycemic drugs: Secondary | ICD-10-CM | POA: Diagnosis not present

## 2020-09-27 DIAGNOSIS — D72823 Leukemoid reaction: Secondary | ICD-10-CM

## 2020-09-27 DIAGNOSIS — E119 Type 2 diabetes mellitus without complications: Secondary | ICD-10-CM | POA: Diagnosis not present

## 2020-09-27 DIAGNOSIS — Z86010 Personal history of colonic polyps: Secondary | ICD-10-CM | POA: Insufficient documentation

## 2020-09-27 DIAGNOSIS — I10 Essential (primary) hypertension: Secondary | ICD-10-CM | POA: Diagnosis not present

## 2020-09-27 DIAGNOSIS — E039 Hypothyroidism, unspecified: Secondary | ICD-10-CM | POA: Diagnosis not present

## 2020-09-27 DIAGNOSIS — Z794 Long term (current) use of insulin: Secondary | ICD-10-CM | POA: Diagnosis not present

## 2020-09-27 DIAGNOSIS — N179 Acute kidney failure, unspecified: Secondary | ICD-10-CM

## 2020-09-27 DIAGNOSIS — W101XXA Fall (on)(from) sidewalk curb, initial encounter: Secondary | ICD-10-CM | POA: Diagnosis not present

## 2020-09-27 DIAGNOSIS — I2510 Atherosclerotic heart disease of native coronary artery without angina pectoris: Secondary | ICD-10-CM | POA: Insufficient documentation

## 2020-09-27 DIAGNOSIS — R200 Anesthesia of skin: Secondary | ICD-10-CM

## 2020-09-27 DIAGNOSIS — I629 Nontraumatic intracranial hemorrhage, unspecified: Secondary | ICD-10-CM

## 2020-09-27 DIAGNOSIS — Z853 Personal history of malignant neoplasm of breast: Secondary | ICD-10-CM | POA: Diagnosis not present

## 2020-09-27 DIAGNOSIS — Z79899 Other long term (current) drug therapy: Secondary | ICD-10-CM | POA: Insufficient documentation

## 2020-09-27 DIAGNOSIS — G9340 Encephalopathy, unspecified: Secondary | ICD-10-CM

## 2020-09-27 DIAGNOSIS — R2689 Other abnormalities of gait and mobility: Secondary | ICD-10-CM | POA: Diagnosis not present

## 2020-09-27 DIAGNOSIS — S0101XA Laceration without foreign body of scalp, initial encounter: Principal | ICD-10-CM | POA: Insufficient documentation

## 2020-09-27 DIAGNOSIS — Z20822 Contact with and (suspected) exposure to covid-19: Secondary | ICD-10-CM | POA: Insufficient documentation

## 2020-09-27 DIAGNOSIS — W19XXXA Unspecified fall, initial encounter: Secondary | ICD-10-CM

## 2020-09-27 DIAGNOSIS — W1839XA Other fall on same level, initial encounter: Secondary | ICD-10-CM | POA: Insufficient documentation

## 2020-09-27 DIAGNOSIS — D72829 Elevated white blood cell count, unspecified: Secondary | ICD-10-CM

## 2020-09-27 DIAGNOSIS — R55 Syncope and collapse: Secondary | ICD-10-CM | POA: Diagnosis not present

## 2020-09-27 DIAGNOSIS — Z7982 Long term (current) use of aspirin: Secondary | ICD-10-CM | POA: Diagnosis not present

## 2020-09-27 DIAGNOSIS — S0990XA Unspecified injury of head, initial encounter: Secondary | ICD-10-CM | POA: Diagnosis present

## 2020-09-27 LAB — I-STAT CHEM 8, ED
BUN: 21 mg/dL (ref 8–23)
Calcium, Ion: 1.16 mmol/L (ref 1.15–1.40)
Chloride: 95 mmol/L — ABNORMAL LOW (ref 98–111)
Creatinine, Ser: 1.2 mg/dL — ABNORMAL HIGH (ref 0.44–1.00)
Glucose, Bld: 144 mg/dL — ABNORMAL HIGH (ref 70–99)
HCT: 32 % — ABNORMAL LOW (ref 36.0–46.0)
Hemoglobin: 10.9 g/dL — ABNORMAL LOW (ref 12.0–15.0)
Potassium: 4.2 mmol/L (ref 3.5–5.1)
Sodium: 133 mmol/L — ABNORMAL LOW (ref 135–145)
TCO2: 26 mmol/L (ref 22–32)

## 2020-09-27 LAB — COMPREHENSIVE METABOLIC PANEL
ALT: 11 U/L (ref 0–44)
AST: 26 U/L (ref 15–41)
Albumin: 3.9 g/dL (ref 3.5–5.0)
Alkaline Phosphatase: 60 U/L (ref 38–126)
Anion gap: 13 (ref 5–15)
BUN: 19 mg/dL (ref 8–23)
CO2: 22 mmol/L (ref 22–32)
Calcium: 9.2 mg/dL (ref 8.9–10.3)
Chloride: 95 mmol/L — ABNORMAL LOW (ref 98–111)
Creatinine, Ser: 1.26 mg/dL — ABNORMAL HIGH (ref 0.44–1.00)
GFR, Estimated: 44 mL/min — ABNORMAL LOW (ref >60)
Glucose, Bld: 149 mg/dL — ABNORMAL HIGH (ref 70–99)
Potassium: 4.4 mmol/L (ref 3.5–5.1)
Sodium: 130 mmol/L — ABNORMAL LOW (ref 135–145)
Total Bilirubin: 0.9 mg/dL (ref 0.3–1.2)
Total Protein: 7 g/dL (ref 6.5–8.1)

## 2020-09-27 LAB — I-STAT VENOUS BLOOD GAS, ED
Acid-Base Excess: 5 mmol/L — ABNORMAL HIGH (ref 0.0–2.0)
Bicarbonate: 30.6 mmol/L — ABNORMAL HIGH (ref 20.0–28.0)
Calcium, Ion: 1.14 mmol/L — ABNORMAL LOW (ref 1.15–1.40)
HCT: 31 % — ABNORMAL LOW (ref 36.0–46.0)
Hemoglobin: 10.5 g/dL — ABNORMAL LOW (ref 12.0–15.0)
O2 Saturation: 69 %
Potassium: 4.2 mmol/L (ref 3.5–5.1)
Sodium: 134 mmol/L — ABNORMAL LOW (ref 135–145)
TCO2: 32 mmol/L (ref 22–32)
pCO2, Ven: 47.4 mmHg (ref 44.0–60.0)
pH, Ven: 7.418 (ref 7.250–7.430)
pO2, Ven: 36 mmHg (ref 32.0–45.0)

## 2020-09-27 LAB — CBC WITH DIFFERENTIAL/PLATELET
Abs Immature Granulocytes: 0.08 10*3/uL — ABNORMAL HIGH (ref 0.00–0.07)
Basophils Absolute: 0.1 10*3/uL (ref 0.0–0.1)
Basophils Relative: 1 %
Eosinophils Absolute: 0.3 10*3/uL (ref 0.0–0.5)
Eosinophils Relative: 3 %
HCT: 33.2 % — ABNORMAL LOW (ref 36.0–46.0)
Hemoglobin: 9.3 g/dL — ABNORMAL LOW (ref 12.0–15.0)
Immature Granulocytes: 1 %
Lymphocytes Relative: 17 %
Lymphs Abs: 2.3 10*3/uL (ref 0.7–4.0)
MCH: 21.3 pg — ABNORMAL LOW (ref 26.0–34.0)
MCHC: 28 g/dL — ABNORMAL LOW (ref 30.0–36.0)
MCV: 76 fL — ABNORMAL LOW (ref 80.0–100.0)
Monocytes Absolute: 1.4 10*3/uL — ABNORMAL HIGH (ref 0.1–1.0)
Monocytes Relative: 11 %
Neutro Abs: 9 10*3/uL — ABNORMAL HIGH (ref 1.7–7.7)
Neutrophils Relative %: 67 %
Platelets: UNDETERMINED 10*3/uL (ref 150–400)
RBC: 4.37 MIL/uL (ref 3.87–5.11)
RDW: 17.5 % — ABNORMAL HIGH (ref 11.5–15.5)
WBC: 13.2 10*3/uL — ABNORMAL HIGH (ref 4.0–10.5)
nRBC: 0 % (ref 0.0–0.2)

## 2020-09-27 LAB — ETHANOL: Alcohol, Ethyl (B): <10 mg/dL (ref <10)

## 2020-09-27 LAB — CBG MONITORING, ED: Glucose-Capillary: 144 mg/dL — ABNORMAL HIGH (ref 70–99)

## 2020-09-27 LAB — LACTIC ACID, PLASMA: Lactic Acid, Venous: 2.8 mmol/L (ref 0.5–1.9)

## 2020-09-27 LAB — TROPONIN I (HIGH SENSITIVITY): Troponin I (High Sensitivity): 5 ng/L (ref <18)

## 2020-09-27 MED ORDER — NALOXONE HCL 0.4 MG/ML IJ SOLN
0.4000 mg | Freq: Once | INTRAMUSCULAR | Status: AC
  Administered 2020-09-27: 0.4 mg via INTRAVENOUS
  Filled 2020-09-27: qty 1

## 2020-09-27 MED ORDER — SODIUM CHLORIDE 0.9 % IV BOLUS
1000.0000 mL | Freq: Once | INTRAVENOUS | Status: AC
  Administered 2020-09-27: 1000 mL via INTRAVENOUS

## 2020-09-27 MED ORDER — IOHEXOL 350 MG/ML SOLN
75.0000 mL | Freq: Once | INTRAVENOUS | Status: AC | PRN
  Administered 2020-09-27: 75 mL via INTRAVENOUS

## 2020-09-27 NOTE — ED Triage Notes (Signed)
PT BIB [facility] for a fall. Bystander saw patient fall outside of [facility], hit head on sidewalk, and was unconscious. Pt has a laceration on the back of head. Pt became more conscious with EMS but does not remember being at [facility] or falling. Pt threw up with EMS, given IM Zofran. EMS reports pt has a hx of thyroid problems, diabetes, and HTN. Takes meds for all. PT is reporting pain in the back of head.  VS in EMS  145/82 Pulse 75 RR 25 on 3L [place] 100% BS 226

## 2020-09-27 NOTE — ED Notes (Addendum)
Date and time results received: 09/27/20 2309 (use smartphrase ".now" to insert current time)  Test: Lactic Acid Critical Value: 2.8  Name of Provider Notified: Billy Fischer, MD  Orders Received? Or Actions Taken?:

## 2020-09-27 NOTE — Telephone Encounter (Signed)
No answer. Left a message for the patient to return my call.

## 2020-09-27 NOTE — Telephone Encounter (Signed)
We can switch to [facility], please provide her with samples if we have any available to try first.  Agree that it may be convenient for her to see physicians that are closer to her home. Thanks

## 2020-09-27 NOTE — ED Notes (Signed)
IT called to fix computer in this room

## 2020-09-28 ENCOUNTER — Observation Stay (HOSPITAL_COMMUNITY): Payer: Medicare HMO

## 2020-09-28 ENCOUNTER — Other Ambulatory Visit: Payer: Self-pay | Admitting: Cardiology

## 2020-09-28 ENCOUNTER — Emergency Department (HOSPITAL_COMMUNITY): Payer: Medicare HMO

## 2020-09-28 ENCOUNTER — Observation Stay (HOSPITAL_BASED_OUTPATIENT_CLINIC_OR_DEPARTMENT_OTHER): Payer: Medicare HMO

## 2020-09-28 ENCOUNTER — Other Ambulatory Visit: Payer: Self-pay

## 2020-09-28 DIAGNOSIS — R55 Syncope and collapse: Secondary | ICD-10-CM

## 2020-09-28 DIAGNOSIS — N179 Acute kidney failure, unspecified: Secondary | ICD-10-CM

## 2020-09-28 DIAGNOSIS — S0101XA Laceration without foreign body of scalp, initial encounter: Secondary | ICD-10-CM | POA: Diagnosis not present

## 2020-09-28 DIAGNOSIS — I629 Nontraumatic intracranial hemorrhage, unspecified: Secondary | ICD-10-CM

## 2020-09-28 DIAGNOSIS — G9340 Encephalopathy, unspecified: Secondary | ICD-10-CM

## 2020-09-28 LAB — BASIC METABOLIC PANEL
Anion gap: 10 (ref 5–15)
BUN: 14 mg/dL (ref 8–23)
CO2: 28 mmol/L (ref 22–32)
Calcium: 8.8 mg/dL — ABNORMAL LOW (ref 8.9–10.3)
Chloride: 98 mmol/L (ref 98–111)
Creatinine, Ser: 1 mg/dL (ref 0.44–1.00)
GFR, Estimated: 58 mL/min — ABNORMAL LOW (ref >60)
Glucose, Bld: 92 mg/dL (ref 70–99)
Potassium: 3.5 mmol/L (ref 3.5–5.1)
Sodium: 136 mmol/L (ref 135–145)

## 2020-09-28 LAB — RAPID URINE DRUG SCREEN, HOSP PERFORMED
Amphetamines: NOT DETECTED
Barbiturates: NOT DETECTED
Benzodiazepines: NOT DETECTED
Cocaine: NOT DETECTED
Opiates: NOT DETECTED
Tetrahydrocannabinol: NOT DETECTED

## 2020-09-28 LAB — URINALYSIS, ROUTINE W REFLEX MICROSCOPIC
Bacteria, UA: NONE SEEN
Bilirubin Urine: NEGATIVE
Glucose, UA: NEGATIVE mg/dL
Ketones, ur: NEGATIVE mg/dL
Leukocytes,Ua: NEGATIVE
Nitrite: NEGATIVE
Protein, ur: NEGATIVE mg/dL
Specific Gravity, Urine: 1.019 (ref 1.005–1.030)
pH: 5 (ref 5.0–8.0)

## 2020-09-28 LAB — CBC
HCT: 29.3 % — ABNORMAL LOW (ref 36.0–46.0)
Hemoglobin: 8.4 g/dL — ABNORMAL LOW (ref 12.0–15.0)
MCH: 21.6 pg — ABNORMAL LOW (ref 26.0–34.0)
MCHC: 28.7 g/dL — ABNORMAL LOW (ref 30.0–36.0)
MCV: 75.3 fL — ABNORMAL LOW (ref 80.0–100.0)
Platelets: 468 10*3/uL — ABNORMAL HIGH (ref 150–400)
RBC: 3.89 MIL/uL (ref 3.87–5.11)
RDW: 17.4 % — ABNORMAL HIGH (ref 11.5–15.5)
WBC: 15 10*3/uL — ABNORMAL HIGH (ref 4.0–10.5)
nRBC: 0 % (ref 0.0–0.2)

## 2020-09-28 LAB — RESP PANEL BY RT-PCR (FLU A&B, COVID) ARPGX2
Influenza A by PCR: NEGATIVE
Influenza B by PCR: NEGATIVE
SARS Coronavirus 2 by RT PCR: NEGATIVE

## 2020-09-28 LAB — URINE CULTURE: Culture: <10000 — AB

## 2020-09-28 LAB — ECHOCARDIOGRAM COMPLETE
Area-P 1/2: 3.21 cm2
Height: 64 in
S' Lateral: 3.1 cm
Weight: 2752 oz

## 2020-09-28 LAB — MAGNESIUM: Magnesium: 1.1 mg/dL — ABNORMAL LOW (ref 1.7–2.4)

## 2020-09-28 LAB — GLUCOSE, CAPILLARY: Glucose-Capillary: 226 mg/dL — ABNORMAL HIGH (ref 70–99)

## 2020-09-28 LAB — VITAMIN B12: Vitamin B-12: 218 pg/mL (ref 180–914)

## 2020-09-28 LAB — T4, FREE: Free T4: 1.26 ng/dL — ABNORMAL HIGH (ref 0.61–1.12)

## 2020-09-28 LAB — CORTISOL-AM, BLOOD: Cortisol - AM: 2.8 ug/dL — ABNORMAL LOW (ref 6.7–22.6)

## 2020-09-28 LAB — D-DIMER, QUANTITATIVE: D-Dimer, Quant: 2.18 ug/mL-FEU — ABNORMAL HIGH (ref 0.00–0.50)

## 2020-09-28 LAB — CBG MONITORING, ED
Glucose-Capillary: 193 mg/dL — ABNORMAL HIGH (ref 70–99)
Glucose-Capillary: 188 mg/dL — ABNORMAL HIGH (ref 70–99)

## 2020-09-28 LAB — LACTIC ACID, PLASMA
Lactic Acid, Venous: 2.3 mmol/L (ref 0.5–1.9)
Lactic Acid, Venous: 1 mmol/L (ref 0.5–1.9)

## 2020-09-28 LAB — TROPONIN I (HIGH SENSITIVITY): Troponin I (High Sensitivity): 8 ng/L (ref <18)

## 2020-09-28 LAB — FOLATE: Folate: 19.8 ng/mL (ref >5.9)

## 2020-09-28 LAB — TSH: TSH: 0.231 u[IU]/mL — ABNORMAL LOW (ref 0.350–4.500)

## 2020-09-28 MED ORDER — CYANOCOBALAMIN 500 MCG PO TABS
250.0000 ug | ORAL_TABLET | Freq: Every day | ORAL | Status: DC
Start: 2020-09-29 — End: 2020-09-28

## 2020-09-28 MED ORDER — ONDANSETRON HCL 4 MG/2ML IJ SOLN
4.0000 mg | Freq: Four times a day (QID) | INTRAMUSCULAR | Status: DC | PRN
  Administered 2020-09-28 – 2020-09-29 (×2): 4 mg via INTRAVENOUS
  Filled 2020-09-28 (×2): qty 2

## 2020-09-28 MED ORDER — INSULIN ASPART 100 UNIT/ML ~~LOC~~ SOLN
0.0000 [IU] | Freq: Every day | SUBCUTANEOUS | Status: DC
  Administered 2020-09-28: 2 [IU] via SUBCUTANEOUS

## 2020-09-28 MED ORDER — DULOXETINE HCL 60 MG PO CPEP
60.0000 mg | ORAL_CAPSULE | Freq: Every day | ORAL | Status: DC
  Administered 2020-09-29: 60 mg via ORAL
  Filled 2020-09-28: qty 1

## 2020-09-28 MED ORDER — AMLODIPINE BESYLATE 5 MG PO TABS
5.0000 mg | ORAL_TABLET | Freq: Every day | ORAL | Status: DC
  Administered 2020-09-28 – 2020-09-29 (×2): 5 mg via ORAL
  Filled 2020-09-28 (×2): qty 1

## 2020-09-28 MED ORDER — MELATONIN 5 MG PO TABS
5.0000 mg | ORAL_TABLET | Freq: Every day | ORAL | Status: DC
  Administered 2020-09-29: 5 mg via ORAL
  Filled 2020-09-28: qty 1

## 2020-09-28 MED ORDER — PERFLUTREN LIPID MICROSPHERE
1.0000 mL | INTRAVENOUS | Status: AC | PRN
  Administered 2020-09-28: 2 mL via INTRAVENOUS
  Filled 2020-09-28: qty 10

## 2020-09-28 MED ORDER — SODIUM CHLORIDE 0.9 % IV SOLN: INTRAVENOUS | Status: DC

## 2020-09-28 MED ORDER — LEVOTHYROXINE SODIUM 25 MCG PO TABS: 137.0000 ug | ORAL_TABLET | Freq: Every day | ORAL | Status: DC

## 2020-09-28 MED ORDER — METOPROLOL SUCCINATE ER 50 MG PO TB24
50.0000 mg | ORAL_TABLET | Freq: Every day | ORAL | Status: DC
  Administered 2020-09-28 – 2020-09-29 (×2): 50 mg via ORAL
  Filled 2020-09-28: qty 1
  Filled 2020-09-28: qty 2

## 2020-09-28 MED ORDER — INSULIN ASPART 100 UNIT/ML ~~LOC~~ SOLN
0.0000 [IU] | Freq: Three times a day (TID) | SUBCUTANEOUS | Status: DC
  Administered 2020-09-28 (×2): 3 [IU] via SUBCUTANEOUS
  Administered 2020-09-29: 5 [IU] via SUBCUTANEOUS
  Administered 2020-09-29: 11 [IU] via SUBCUTANEOUS

## 2020-09-28 MED ORDER — LIDOCAINE-EPINEPHRINE 1 %-1:100000 IJ SOLN
10.0000 mL | Freq: Once | INTRAMUSCULAR | Status: AC
  Administered 2020-09-28: 10 mL via INTRADERMAL
  Filled 2020-09-28: qty 1

## 2020-09-28 MED ORDER — ACETAMINOPHEN 650 MG RE SUPP: 650.0000 mg | Freq: Four times a day (QID) | RECTAL | Status: DC | PRN

## 2020-09-28 MED ORDER — LACTATED RINGERS IV SOLN: INTRAVENOUS | Status: DC

## 2020-09-28 MED ORDER — ACETAMINOPHEN 325 MG PO TABS
650.0000 mg | ORAL_TABLET | Freq: Four times a day (QID) | ORAL | Status: DC | PRN
  Administered 2020-09-28 (×2): 650 mg via ORAL
  Filled 2020-09-28 (×2): qty 2

## 2020-09-28 MED ORDER — LEVOTHYROXINE SODIUM 25 MCG PO TABS
137.0000 ug | ORAL_TABLET | Freq: Every day | ORAL | Status: DC
  Administered 2020-09-29: 137 ug via ORAL
  Filled 2020-09-28: qty 1

## 2020-09-28 MED ORDER — CYANOCOBALAMIN 1000 MCG/ML IJ SOLN: 1000.0000 ug | Freq: Once | INTRAMUSCULAR | Status: DC

## 2020-09-28 MED ORDER — PANTOPRAZOLE SODIUM 40 MG PO TBEC
40.0000 mg | DELAYED_RELEASE_TABLET | Freq: Two times a day (BID) | ORAL | Status: DC
  Administered 2020-09-28 – 2020-09-29 (×3): 40 mg via ORAL
  Filled 2020-09-28 (×3): qty 1

## 2020-09-28 MED ORDER — VITAMIN B-12 1000 MCG PO TABS
1000.0000 ug | ORAL_TABLET | Freq: Every day | ORAL | Status: DC
  Administered 2020-09-28 – 2020-09-29 (×2): 1000 ug via ORAL
  Filled 2020-09-28 (×2): qty 1

## 2020-09-28 MED ORDER — LINACLOTIDE 145 MCG PO CAPS
290.0000 ug | ORAL_CAPSULE | Freq: Every day | ORAL | Status: DC
  Administered 2020-09-29: 290 ug via ORAL
  Filled 2020-09-28: qty 2

## 2020-09-28 NOTE — H&P (Signed)
History and Physical    Jeanne Kim [id number] DOB: January 27, 1941 DOA: 09/27/2020  PCP: Neale Burly, MD Patient coming from: Home  Chief Complaint: Fall  HPI: Jeanne Kim is a 79 y.o. female with medical history significant of anxiety, arthritis, history of breast cancer, CAD, depression, GERD, hypertension, hyperlipidemia, hypothyroidism, IBS, OSA on CPAP, insulin-dependent type 2 diabetes presenting to the ED via EMS. For evaluation of syncope and altered mental status. Bystanders saw the patient have syncope and fall, hitting the back of her head and causing a laceration. Patient vomited with EMS and was given Zofran. Her CBG was in the 200s.  Patient is not sure what happened.  She remembers going to [facility] to pick up some medications and the next thing remembers waking up with EMS.  She does not recall feeling dizzy or having any chest pain, palpitations, or shortness of breath.  She takes a baby aspirin daily but is not on any other blood thinners.  States the night before she had candy and when she checked her sugar it was high in the 400s for which she took insulin.  About 3 weeks ago she had an episode of slurred speech and when she checked her sugar it was high at that time.  Patient denies any headaches, nausea, or vomiting.  Denies abdominal pain, diarrhea, or dysuria.  No other complaints.  ED Course: Vital signs stable. WBC 13.2, hemoglobin 9.3 (at baseline), platelet count could not be estimated due to clumps on the smear. Sodium 130, potassium 4.4, chloride 95, bicarb 22, BUN 19, creatinine 1.2, glucose 149. LFTs normal. High-sensitivity troponin negative x2. Lactic acid 2.8 >2.3. Blood ethanol level undetectable. VBG with pH 7.41. SARS-CoV-2 PCR test and influenza panel both negative. TSH low at 0.23. UA without signs of infection. UDS negative.  Chest x-ray showing no active disease.  No CT head without contrast done.  CTA head and neck negative for LVO.  CT C-spine  showing no acute traumatic injury.  Left lower extremity numbness noted on exam done in the ED. Neurology was consulted. Head laceration was repaired in the ED.  Patient was given Narcan in the ED with no significant response.  She was also given 1 L normal saline bolus.  Review of Systems:  All systems reviewed and apart from history of presenting illness, are negative.  Past Medical History:  Diagnosis Date  . Allergy   . Anemia   . Anginal pain ([place])   . Anxiety   . Arthritis   . Blood transfusion without reported diagnosis   . Breast cancer ([place])   . Carotid artery plaque    bilat  . Cataract   . Colon polyps    adenomatous  . Concussion 02/2016   Driving restrictions for 6w  . Coronary heart disease   . Depression   . Diverticulosis   . Dizziness   . Esophageal reflux   . Esophageal stricture   . Fibromyalgia   . Gallstones   . Hiatal hernia   . Hypercholesterolemia   . Hypertension   . Hypothyroidism   . IBS (irritable bowel syndrome)   . Kidney stones   . Osteoporosis   . Pneumonia    recently, just finished ABO  . Pneumonia   . PONV (postoperative nausea and vomiting)   . Sleep apnea    CPAP  . Thyroid disease 2002  . Type II or unspecified type diabetes mellitus without mention of complication, not stated as uncontrolled   .  Unspecified gastritis and gastroduodenitis without mention of hemorrhage     Past Surgical History:  Procedure Laterality Date  . ANAL RECTAL MANOMETRY N/A 08/05/2015   Procedure: ANO RECTAL MANOMETRY;  Surgeon: Mauri Pole, MD;  Location: [facility];  Service: Endoscopy;  Laterality: N/A;  . APPENDECTOMY    . BIOPSY  08/06/2020   Procedure: BIOPSY;  Surgeon: Eloise Harman, DO;  Location: [facility];  Service: Endoscopy;;  . BREAST LUMPECTOMY     left  . CARDIAC CATHETERIZATION    . CATARACT EXTRACTION W/PHACO Left 03/30/2013   Procedure: CATARACT EXTRACTION PHACO AND INTRAOCULAR LENS PLACEMENT (IOC);  Surgeon:  Tonny Branch, MD;  Location: [facility]S;  Service: Ophthalmology;  Laterality: Left;  CDE:19.28  . CATARACT EXTRACTION W/PHACO Right 04/09/2013   Procedure: CATARACT EXTRACTION PHACO AND INTRAOCULAR LENS PLACEMENT (IOC);  Surgeon: Tonny Branch, MD;  Location: [facility];  Service: Ophthalmology;  Laterality: Right;  CDE: 15.44  . CERVICAL FUSION    . CESAREAN SECTION    . CHOLECYSTECTOMY    . CYSTOCELE REPAIR    . DILATION AND CURETTAGE OF UTERUS    . ESOPHAGOGASTRODUODENOSCOPY (EGD) WITH PROPOFOL N/A 08/06/2020   Procedure: ESOPHAGOGASTRODUODENOSCOPY (EGD) WITH PROPOFOL;  Surgeon: Eloise Harman, DO;  Location: [facility];  Service: Endoscopy;  Laterality: N/A;  . EYE SURGERY Left 03-30-13   Cataract  . EYE SURGERY Right 04-09-13   Cataract  . RECTOCELE REPAIR    . THYROIDECTOMY    . TONSILLECTOMY    . TOTAL ABDOMINAL HYSTERECTOMY       reports that she has never smoked. She has never used smokeless tobacco. She reports that she does not drink alcohol and does not use drugs.  Allergies  Allergen Reactions  . Codeine Hives  . Sulfonamide Derivatives Hives    Family History  Problem Relation Age of Onset  . Sarcoidosis Mother   . Diabetes Mother   . Cancer Mother        sarcoma -  Right arm amputation  . Hypertension Mother   . Lung cancer Father   . Diabetes Father   . Heart disease Father        Before age 30  . Urolithiasis Father   . Cancer Father        Lung  . Hyperlipidemia Father   . Hypertension Father   . Esophageal cancer Maternal Uncle   . Ovarian cancer Maternal Aunt   . Colon cancer Maternal Uncle   . Lung cancer Maternal Uncle   . Diabetes Maternal Uncle   . Diabetes Maternal Aunt   . Colon polyps Maternal Uncle   . Heart disease Paternal Grandmother     Prior to Admission medications   Medication Sig Start Date End Date Taking? Authorizing Provider  aspirin 81 MG chewable tablet Chew 81 mg by mouth daily.    [provider]  atorvastatin (LIPITOR)  20 MG tablet Take 1 tablet (20 mg total) by mouth daily. Patient not taking: Reported on 08/05/2020 10/17/12   Hillary Bow, MD  CALCIUM-MAGNESIUM-ZINC PO Take 3 tablets by mouth daily.    [provider]  Coenzyme Q10 (CO Q-10) 100 MG CAPS Take 100 mg by mouth daily.    [provider]  cyanocobalamin (,VITAMIN B-12,) 1000 MCG/ML injection INJECT 1 ML INTO MUSCLE ONCE A MONTH 04/27/19   Mauri Pole, MD  doxepin (SINEQUAN) 10 MG capsule Take by mouth. 02/16/19   [provider]  DULoxetine (  CYMBALTA) 60 MG capsule Take 60 mg by mouth daily.    [provider]  EPINEPHrine 0.3 mg/0.3 mL IJ SOAJ injection Frequency:PHARMDIR   Dosage:0.0     Instructions:  Note:self inject in the event of allergic reaction, then proceed immediately to the nearest Emergency Room. Dose: 1 INJECTION 07/18/09   [provider]  fentaNYL (DURAGESIC - DOSED MCG/HR) 25 MCG/HR patch Place 25 mcg onto the skin every 3 (three) days.    [provider]  fexofenadine (ALLEGRA) 30 MG tablet Take 30 mg by mouth daily.    [provider]  fluconazole (DIFLUCAN) 150 MG tablet Take 150 mg by mouth once. 08/03/20   [provider]  fluticasone (FLONASE) 50 MCG/ACT nasal spray Place 2 sprays into the nose daily.    [provider]  furosemide (LASIX) 40 MG tablet Take 1 tablet (40 mg total) by mouth 3 (three) times a week. Take on Monday/Wednesday/Friday 07/06/20   Richardson Dopp T, PA-C  gabapentin (NEURONTIN) 300 MG capsule Take 300 mg by mouth at bedtime.    [provider]  gentamicin (GARAMYCIN) 0.3 % ophthalmic solution 1 drop 3 (three) times daily. 06/16/20   [provider]  glyBURIDE (DIABETA) 5 MG tablet Take 5 mg by mouth daily. 02/12/20   [provider]  Insulin Glargine-Lixisenatide (SOLIQUA) 100-33 UNT-MCG/ML SOPN Inject 20 Units into the skin. Pt adjust between 20-25 units    [provider]  lidocaine  (LIDODERM) 5 % Place 1 patch onto the skin daily. Remove & Discard patch within 12 hours or as directed by MD    [provider]  LINZESS 290 MCG CAPS capsule TAKE 1 CAPSULE BY MOUTH EVERY DAY 04/12/20   Mauri Pole, MD  Melatonin 10 MG TABS Take by mouth.    [provider]  metFORMIN (GLUCOPHAGE) 1000 MG tablet Take 1,000 mg by mouth 2 (two) times daily with a meal.    [provider]  metolazone (ZAROXOLYN) 5 MG tablet Take 5 mg by mouth daily.    [provider]  metoprolol succinate (TOPROL-XL) 50 MG 24 hr tablet TAKE 1 TABLET BY MOUTH EVERY DAY 07/08/20   Sherren Mocha, MD  midodrine (PROAMATINE) 10 MG tablet  07/11/20   [provider]  montelukast (SINGULAIR) 10 MG tablet Take 10 mg by mouth at bedtime.    [provider]  Needles & Syringes MISC 25G 5/8 inch needle on 3 ml syringe-Use once monthly for B12 injections 07/20/15   Nandigam, Karleen Hampshire V, MD  nitroGLYCERIN (NITROSTAT) 0.4 MG SL tablet PLACE 1 TABLET (0.4 MG TOTAL) UNDER THE TONGUE EVERY 5 (FIVE) MINUTES AS NEEDED FOR CHEST PAIN. 12/03/19   Sherren Mocha, MD  Omega-3 Fatty Acids (FISH OIL PO) Take by mouth. 07/18/09   [provider]  [facility] VERIO test strip 1 each by Other route as directed.  04/18/16   [provider]  pantoprazole (PROTONIX) 40 MG tablet Take 1 tablet (40 mg total) by mouth 2 (two) times daily. 08/07/20   Mariel Aloe, MD  potassium chloride (KLOR-CON) 10 MEQ tablet Take 2 tablets (20 mEq total) by mouth 3 (three) times a week. 07/06/20   Richardson Dopp T, PA-C  ramipril (ALTACE) 5 MG capsule Take 1 capsule (5 mg total) by mouth at bedtime. 01/11/16   Sherren Mocha, MD  senna-docusate (SENOKOT-S) 8.6-50 MG tablet Take by mouth. 03/25/20   [provider]  SYNTHROID 137 MCG tablet Take 137 mcg by mouth  daily. 06/30/20   [provider]  tiZANidine (ZANAFLEX) 4 MG tablet Take 4 mg by mouth 2 (two) times daily. 08/02/20    [provider]  traMADol (ULTRAM) 50 MG tablet Take by mouth. 03/25/20   [provider]    Physical Exam: Vitals:   09/28/20 0400 09/28/20 0415 09/28/20 0604 09/28/20 0730  BP: 128/66 137/66 106/83 134/72  Pulse: 89 90 89 93  Resp: 19 20 14 18   Temp:    99.1 F (37.3 C)  TempSrc:      SpO2: 94% 93% 96% 96%  Weight:    78 kg  Height:    5\' 4"  (1.626 m)    .Physical Exam Constitutional:      General: She is not in acute distress. HENT:     Head: Normocephalic.  Eyes:     Conjunctiva/sclera: Conjunctivae normal.     Pupils: Pupils are equal, round, and reactive to light.  Cardiovascular:     Rate and Rhythm: Normal rate and regular rhythm.     Pulses: Normal pulses.  Pulmonary:     Effort: Pulmonary effort is normal. No respiratory distress.     Breath sounds: Normal breath sounds. No wheezing or rales.  Abdominal:     General: Bowel sounds are normal. There is no distension.     Palpations: Abdomen is soft.     Tenderness: There is no abdominal tenderness.  Musculoskeletal:     Cervical back: Normal range of motion and neck supple.     Right lower leg: Edema present.     Left lower leg: Edema present.     Comments: +1 pitting edema of bilateral lower extremities  Skin:    General: Skin is warm and dry.  Neurological:     General: No focal deficit present.     Mental Status: She is alert and oriented to person, place, and time.     Sensory: No sensory deficit.     Motor: No weakness.     Labs on Admission: I have personally reviewed following labs and imaging studies  CBC: Recent Labs  Lab 09/27/20 2108 09/27/20 2135 09/27/20 2137  WBC 13.2*  --   --   NEUTROABS 9.0*  --   --   HGB 9.3* 10.9* 10.5*  HCT 33.2* 32.0* 31.0*  MCV 76.0*  --   --   PLT PLATELET CLUMPS NOTED ON SMEAR, UNABLE TO ESTIMATE  --   --    Basic Metabolic Panel: Recent Labs  Lab 09/27/20 2108 09/27/20 2135 09/27/20 2137  NA 130* 133* 134*  K 4.4 4.2 4.2  CL  95* 95*  --   CO2 22  --   --   GLUCOSE 149* 144*  --   BUN 19 21  --   CREATININE 1.26* 1.20*  --   CALCIUM 9.2  --   --    GFR: Estimated Creatinine Clearance: 39.7 mL/min (A) (by C-G formula based on SCr of 1.2 mg/dL (H)). Liver Function Tests: Recent Labs  Lab 09/27/20 2108  AST 26  ALT 11  ALKPHOS 60  BILITOT 0.9  PROT 7.0  ALBUMIN 3.9   No results for input(s): LIPASE, AMYLASE in the last 168 hours. No results for input(s): AMMONIA in the last 168 hours. Coagulation Profile: No results for input(s): INR, PROTIME in the last 168 hours. Cardiac Enzymes: No results for input(s): CKTOTAL, CKMB, CKMBINDEX, TROPONINI in the last 168 hours. BNP (last 3 results) Recent Labs  07/04/20 1524  PROBNP 165   HbA1C: No results for input(s): HGBA1C in the last 72 hours. CBG: Recent Labs  Lab 09/27/20 2130  GLUCAP 144*   Lipid Profile: No results for input(s): CHOL, HDL, LDLCALC, TRIG, CHOLHDL, LDLDIRECT in the last 72 hours. Thyroid Function Tests: Recent Labs    09/28/20 0424  TSH 0.231*   Anemia Panel: Recent Labs    09/28/20 0424  VITAMINB12 218  FOLATE 19.8   Urine analysis:    Component Value Date/Time   COLORURINE YELLOW 09/28/2020 0427   APPEARANCEUR CLEAR 09/28/2020 0427   LABSPEC 1.019 09/28/2020 0427   PHURINE 5.0 09/28/2020 0427   GLUCOSEU NEGATIVE 09/28/2020 0427   HGBUR SMALL (A) 09/28/2020 0427   BILIRUBINUR NEGATIVE 09/28/2020 0427   KETONESUR NEGATIVE 09/28/2020 0427   PROTEINUR NEGATIVE 09/28/2020 0427   NITRITE NEGATIVE 09/28/2020 0427   LEUKOCYTESUR NEGATIVE 09/28/2020 0427    Radiological Exams on Admission: CT Angio Head W or Wo Contrast  Addendum Date: 09/28/2020   ADDENDUM REPORT: 09/28/2020 04:51 ADDENDUM: At time of the initial dictation, the noncontrast head portion of this exam was not available for review due to technical air. Images are now available for review, with the following findings. Age-related cerebral atrophy  with mild chronic small vessel ischemic disease. Tiny subdural hemorrhage overlies the left parietal convexity, measuring no more than 2 mm in maximal thickness. Additional scant 1-2 mm extra-axial hemorrhage overlies the right frontal convexity without mass effect. Scattered small volume subarachnoid blood seen overlying the anterior frontal convexities bilaterally. Patchy small volume hemorrhage present within the right sylvian fissure. Few small foci of likely subarachnoid blood overlie the peripheral right cerebellum (series 1, image 10). No associated mass effect. No acute large vessel territory infarct. No mass lesion or midline shift. Mild ventricular prominence somewhat out of proportion to cortical sulcation. No hyperdense vessel. Scattered vascular calcifications noted within the carotid siphons. Small right parietal scalp laceration/contusion. No calvarial fracture. Globes and orbital soft tissues demonstrate no acute finding. Paranasal sinuses are largely clear. No significant mastoid effusion. IMPRESSION: 1. Small 2 mm subdural hemorrhage overlying the left parietal convexity, with additional 1-2 mm extra-axial collection overlying the anterior right frontal convexity. No associated mass effect. 2. Patchy small volume subarachnoid hemorrhage involving the anterior frontal lobes bilaterally, right sylvian fissure, and peripheral right cerebellum. 3. Small right parietal scalp contusion/laceration. No calvarial fracture. 4. Mild ventricular prominence somewhat out of proportion to cortical sulcation. While this finding may be related to underlying atrophy, a degree of NPH could be contributory. At time of this dictation, these findings have already been communicated to Dr. Marlowe Sax of the hospitalist service, communicated at 4:05 a.m. on 09/28/2020. Electronically Signed   By: Jeannine Boga M.D.   On: 09/28/2020 04:51   Result Date: 09/28/2020 CLINICAL DATA:  Initial evaluation for neuro deficit,  stroke suspected. EXAM: CT ANGIOGRAPHY HEAD AND NECK TECHNIQUE: Multidetector CT imaging of the head and neck was performed using the standard protocol during bolus administration of intravenous contrast. Multiplanar CT image reconstructions and MIPs were obtained to evaluate the vascular anatomy. Carotid stenosis measurements (when applicable) are obtained utilizing NASCET criteria, using the distal internal carotid diameter as the denominator. CONTRAST:  24mL OMNIPAQUE IOHEXOL 350 MG/ML SOLN COMPARISON:  Prior MRA from 02/21/2012. FINDINGS: CTA NECK FINDINGS Aortic arch: Visualized aortic arch of normal caliber with normal branch pattern. Mild atheromatous change about the aortic arch and origin of the great vessels. No hemodynamically significant stenosis about the origin  of the great vessels. Right carotid system: Right CCA tortuous proximally but widely patent to the bifurcation. Minimal atheromatous plaque about the right bifurcation without stenosis. Right ICA widely patent distally to the skull base without stenosis, dissection or occlusion. Left carotid system: Left CCA tortuous proximally but is widely patent to the bifurcation without stenosis. Minimal atheromatous plaque about the left bifurcation without stenosis. Left ICA mildly tortuous but widely patent to the skull base without stenosis, dissection or occlusion. Vertebral arteries: Both vertebral arteries arise from subclavian arteries. No proximal subclavian artery stenosis. Vertebral arteries tortuous proximally but are widely patent without stenosis, dissection or occlusion. Skeleton: No visible acute osseous abnormality. No discrete or worrisome osseous lesions. Patient is status post prior fusion at C4 through C7. Other neck: No other acute soft tissue abnormality within the neck. No mass or adenopathy. 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 Upper chest: Mild scattered ground-glass opacity with interlobular septal thickening within the visualized lungs  suggest a degree of pulmonary interstitial edema. Visualized upper chest demonstrates no other acute finding. Review of the MIP images confirms the above findings CTA HEAD FINDINGS Anterior circulation: Petrous segments widely patent bilaterally. Scattered atheromatous change within the carotid siphons without significant stenosis. A1 segments widely patent. Normal anterior communicating artery complex. Anterior cerebral arteries widely patent to their distal aspects. No M1 stenosis or occlusion. Normal MCA bifurcations. Distal MCA branches well perfused and symmetric. Posterior circulation: Both V4 segments widely patent to the vertebrobasilar junction. Both PICA origins patent and normal. Basilar widely patent to its distal aspect. Superior cerebral arteries patent bilaterally. Both PCAs primarily supplied via the basilar well perfused to their distal aspects. Venous sinuses: Grossly patent allowing for timing the contrast bolus. Anatomic variants: None significant.  No intracranial aneurysm. Review of the MIP images confirms the above findings IMPRESSION: 1. Negative CTA for large vessel occlusion. 2. Mild for age atheromatous change about the major arterial vasculature of the head and neck as above. No hemodynamically significant or correctable stenosis. 3. Diffuse tortuosity of the major arterial vasculature of the neck, suggesting chronic underlying hypertension. 4. Scattered ground-glass opacity with interlobular septal thickening within the visualized lungs, suggesting a degree of pulmonary interstitial edema. Correlation with plain film radiography suggested. Electronically Signed: By: Jeannine Boga M.D. On: 09/27/2020 23:35   CT Angio Neck W and/or Wo Contrast  Addendum Date: 09/28/2020   ADDENDUM REPORT: 09/28/2020 04:51 ADDENDUM: At time of the initial dictation, the noncontrast head portion of this exam was not available for review due to technical air. Images are now available for review,  with the following findings. Age-related cerebral atrophy with mild chronic small vessel ischemic disease. Tiny subdural hemorrhage overlies the left parietal convexity, measuring no more than 2 mm in maximal thickness. Additional scant 1-2 mm extra-axial hemorrhage overlies the right frontal convexity without mass effect. Scattered small volume subarachnoid blood seen overlying the anterior frontal convexities bilaterally. Patchy small volume hemorrhage present within the right sylvian fissure. Few small foci of likely subarachnoid blood overlie the peripheral right cerebellum (series 1, image 10). No associated mass effect. No acute large vessel territory infarct. No mass lesion or midline shift. Mild ventricular prominence somewhat out of proportion to cortical sulcation. No hyperdense vessel. Scattered vascular calcifications noted within the carotid siphons. Small right parietal scalp laceration/contusion. No calvarial fracture. Globes and orbital soft tissues demonstrate no acute finding. Paranasal sinuses are largely clear. No significant mastoid effusion. IMPRESSION: 1. Small 2 mm subdural hemorrhage overlying the left parietal convexity,  with additional 1-2 mm extra-axial collection overlying the anterior right frontal convexity. No associated mass effect. 2. Patchy small volume subarachnoid hemorrhage involving the anterior frontal lobes bilaterally, right sylvian fissure, and peripheral right cerebellum. 3. Small right parietal scalp contusion/laceration. No calvarial fracture. 4. Mild ventricular prominence somewhat out of proportion to cortical sulcation. While this finding may be related to underlying atrophy, a degree of NPH could be contributory. At time of this dictation, these findings have already been communicated to Dr. Marlowe Sax of the hospitalist service, communicated at 4:05 a.m. on 09/28/2020. Electronically Signed   By: Jeannine Boga M.D.   On: 09/28/2020 04:51   Result Date:  09/28/2020 CLINICAL DATA:  Initial evaluation for neuro deficit, stroke suspected. EXAM: CT ANGIOGRAPHY HEAD AND NECK TECHNIQUE: Multidetector CT imaging of the head and neck was performed using the standard protocol during bolus administration of intravenous contrast. Multiplanar CT image reconstructions and MIPs were obtained to evaluate the vascular anatomy. Carotid stenosis measurements (when applicable) are obtained utilizing NASCET criteria, using the distal internal carotid diameter as the denominator. CONTRAST:  65mL OMNIPAQUE IOHEXOL 350 MG/ML SOLN COMPARISON:  Prior MRA from 02/21/2012. FINDINGS: CTA NECK FINDINGS Aortic arch: Visualized aortic arch of normal caliber with normal branch pattern. Mild atheromatous change about the aortic arch and origin of the great vessels. No hemodynamically significant stenosis about the origin of the great vessels. Right carotid system: Right CCA tortuous proximally but widely patent to the bifurcation. Minimal atheromatous plaque about the right bifurcation without stenosis. Right ICA widely patent distally to the skull base without stenosis, dissection or occlusion. Left carotid system: Left CCA tortuous proximally but is widely patent to the bifurcation without stenosis. Minimal atheromatous plaque about the left bifurcation without stenosis. Left ICA mildly tortuous but widely patent to the skull base without stenosis, dissection or occlusion. Vertebral arteries: Both vertebral arteries arise from subclavian arteries. No proximal subclavian artery stenosis. Vertebral arteries tortuous proximally but are widely patent without stenosis, dissection or occlusion. Skeleton: No visible acute osseous abnormality. No discrete or worrisome osseous lesions. Patient is status post prior fusion at C4 through C7. Other neck: No other acute soft tissue abnormality within the neck. No mass or adenopathy. 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 Upper chest: Mild scattered ground-glass  opacity with interlobular septal thickening within the visualized lungs suggest a degree of pulmonary interstitial edema. Visualized upper chest demonstrates no other acute finding. Review of the MIP images confirms the above findings CTA HEAD FINDINGS Anterior circulation: Petrous segments widely patent bilaterally. Scattered atheromatous change within the carotid siphons without significant stenosis. A1 segments widely patent. Normal anterior communicating artery complex. Anterior cerebral arteries widely patent to their distal aspects. No M1 stenosis or occlusion. Normal MCA bifurcations. Distal MCA branches well perfused and symmetric. Posterior circulation: Both V4 segments widely patent to the vertebrobasilar junction. Both PICA origins patent and normal. Basilar widely patent to its distal aspect. Superior cerebral arteries patent bilaterally. Both PCAs primarily supplied via the basilar well perfused to their distal aspects. Venous sinuses: Grossly patent allowing for timing the contrast bolus. Anatomic variants: None significant.  No intracranial aneurysm. Review of the MIP images confirms the above findings IMPRESSION: 1. Negative CTA for large vessel occlusion. 2. Mild for age atheromatous change about the major arterial vasculature of the head and neck as above. No hemodynamically significant or correctable stenosis. 3. Diffuse tortuosity of the major arterial vasculature of the neck, suggesting chronic underlying hypertension. 4. Scattered ground-glass opacity with interlobular septal thickening  within the visualized lungs, suggesting a degree of pulmonary interstitial edema. Correlation with plain film radiography suggested. Electronically Signed: By: Jeannine Boga M.D. On: 09/27/2020 23:35   MR BRAIN WO CONTRAST  Result Date: 09/28/2020 CLINICAL DATA:  Initial evaluation for acute syncope, fall, neuro deficit. EXAM: MRI HEAD WITHOUT CONTRAST TECHNIQUE: Multiplanar, multiecho pulse  sequences of the brain and surrounding structures were obtained without intravenous contrast. COMPARISON:  Prior CTA from 09/27/2020. Additionally, comparison made with a prior noncontrast head CT from 09/27/2020, visible in PACS, but not completed at time of dictation of prior CTA or of this MRI, and not yet dictated. FINDINGS: Brain: Generalized age-related cerebral atrophy. Minimal chronic microvascular ischemic disease for age. No abnormal foci of restricted diffusion to suggest acute or subacute ischemia. Gray-white matter differentiation maintained. No encephalomalacia to suggest chronic cortical infarction. Thinning 2 mm subdural hematoma seen overlying the left parietal convexity (series 11, image 16). This is faintly visible on prior CT. There is an additional faint extra-axial collection overlying the right frontal convexity (series 11, image 15), extremely difficult to visualized on prior CT. This measures 1-2 mm in maximal thickness. Scattered small volume subarachnoid blood seen along both frontal convexities in within the right sylvian fissure. Patchy small volume hemorrhage seen at the peripheral right cerebellum (series 14, image 15). Findings are also visible on prior CT, and overall not significantly changed or worsened. No mass lesion, midline shift, or mass effect. Diffuse ventricular prominence somewhat out of proportion to cortical sulcation, which could reflect a degree of NPH. No frank hydrocephalus or transependymal flow of CSF. Empty sella noted.  Midline structures intact. Vascular: Major intracranial vascular flow voids are maintained. Skull and upper cervical spine: Craniocervical junction within normal limits. Bone marrow signal intensity normal. Right parietal scalp laceration with skin staples in place. Sinuses/Orbits: Globes and orbital soft tissues demonstrate no acute finding. Patient status post bilateral ocular lens replacement. Mild scattered mucosal thickening noted with  throughout the paranasal sinuses. Small left mastoid effusion noted, of doubtful significance. Inner ear structures grossly normal. Other: None. IMPRESSION: 1. Thin 2 mm subdural hematoma overlying the left parietal convexity, with additional 1-2 mm extra-axial collection overlying the right frontal convexity. No significant mass effect. 2. Scattered small volume posttraumatic subarachnoid hemorrhage overlying the bilateral frontal convexities, right sylvian fissure, and right cerebellum. 3. Diffuse ventricular prominence, somewhat out of proportion to cortical sulcation. While this finding may be related to underlying atrophy, a degree of NPH could be contributory. No transependymal flow of CSF. 4. No acute intracranial infarct or other abnormality. Critical Value/emergent results were called by telephone at the time of interpretation on 09/28/2020 at 4:05 am to provider Dr. Rachell Cipro , who verbally acknowledged these results. Electronically Signed   By: Jeannine Boga M.D.   On: 09/28/2020 04:13   CT C-SPINE NO CHARGE  Result Date: 09/28/2020 CLINICAL DATA:  Initial evaluation for acute trauma, fall. EXAM: CT CERVICAL SPINE WITHOUT CONTRAST TECHNIQUE: Multidetector CT imaging of the cervical spine was performed without intravenous contrast. Multiplanar CT image reconstructions were also generated. COMPARISON:  Prior CTA from 09/27/2020. FINDINGS: Alignment: Straightening of the normal cervical lordosis. Trace 2 mm anterolisthesis of C7 on T1, T1 on T2, and T2 on T3, chronic and facet mediated. Skull base and vertebrae: Skull base intact. Normal C1-2 articulations are preserved in the dens is intact. Vertebral body height maintained. No acute or chronic fracture. No discrete osseous lesions. Soft tissues and spinal canal: Paraspinous soft tissues demonstrate no  acute abnormality. No abnormal prevertebral edema. Vascular structures described on prior CTA. Disc levels: Prior fusion at C4 through C7.  Solid arthrodesis at C5 through C7, with pseudoarthrosis at C4-5. The right screw at the level of C4 is fractured (series 6, image 28). Minimal periprosthetic lucency about the contralateral left screw of C4 (series 3, image 51). Hardware otherwise intact without evidence for failure or other complication central disc protrusion at C2-3 with resultant mild spinal stenosis (series 4, image 37). Upper chest: Findings suggestive of mild pulmonary interstitial edema again noted within the visualized lungs. Other: None. IMPRESSION: 1. No acute traumatic injury within the cervical spine. 2. Prior fusion at C4 through C7 with solid arthrodesis at C5 through C7, with pseudoarthrosis at C4-5. The right screw at the level of C4 is fractured. Minimal periprosthetic lucency about the contralateral left screw of C4 suggesting loosening. No other hardware complication. 3. Central disc protrusion at C2-3 with resultant mild spinal stenosis. 4. Findings suggestive of mild pulmonary interstitial edema within the visualized lungs. Electronically Signed   By: Jeannine Boga M.D.   On: 09/28/2020 03:11   DG Chest Portable 1 View  Result Date: 09/27/2020 CLINICAL DATA:  80 year old female with altered mental status. EXAM: PORTABLE CHEST 1 VIEW COMPARISON:  Chest radiograph dated 08/05/2020. FINDINGS: Minimal bibasilar atelectasis. No focal consolidation, pleural effusion, pneumothorax. The cardiac silhouette is within limits. No acute osseous pathology. Cervical spine ACDF. IMPRESSION: No active disease. Electronically Signed   By: Anner Crete M.D.   On: 09/27/2020 21:35    EKG: Independently reviewed. Sinus rhythm, no significant change from prior tracing.  Assessment/Plan Principal Problem:   Syncope Active Problems:   Essential hypertension   Intracranial bleed (HCC)   Leukocytosis   AKI (acute kidney injury) ([place])   Syncope -Concern for cardiogenic cause as there was no prodrome.  EKG not suggestive of  ACS or arrhythmia.  High-sensitivity troponin negative x2.  Continue cardiac monitoring.  Echocardiogram ordered. -Check orthostatics -EEG ordered -No acute intracranial infarct on MRI. -PE is on the differential although less likely as patient is not tachycardic or hypoxic.  Check D-dimer level, if elevated order CTA to rule out PE.  Intracranial bleed secondary to head injury -No CT head without contrast done in the ED.  CTA head and neck was done and initially read by radiologist as negative for LVO.  Brain MRI was ordered by neurology.  Called by radiologist later stating that both CT and brain MRI did show evidence of intracranial bleed: a small 2 mm subdural hemorrhage overlying the left parietal convexity with additional 1 to 2 mm extra-axial collection overlying the anterior right frontal convexity. No associated mass-effect. Patchy small volume subarachnoid hemorrhage involving the anterior frontal lobes bilaterally, right sylvian fissure, and peripheral right cerebellum. Small right parietal scalp contusion/laceration. No calvarial fracture. Mild ventricular prominence somewhat out of proportion to cortical sulcation felt to be related to underlying atrophy versus a degree of NPH.  -Patient is AAO x3 and answering questions appropriately.  Neuro exam currently nonfocal. -I spoke to Dr. Ellene Route with neurosurgery -no plan for urgent surgery at this time unless there is a change in her neurologic exam.  He recommends repeating the head CT in 24 hours.  Neurosurgery will consult. -Frequent neurochecks -Hold home aspirin  Mild leukocytosis -Likely reactive.  Chest x-ray not suggestive of pneumonia.  UA without signs of infection. -Repeat CBC  Mild hyponatremia -Sodium 130 on initial metabolic panel in [id number] on i-STAT chemistry.  Patient received 1 L normal saline bolus in the ED.  Repeat BMP  Mild AKI -Creatinine 1.2, baseline 0.7.  IV fluid hydration and repeat BMP.  Hold home diuretic and  ACE inhibitor.  Mild lactic acidosis -Lactic acid 2.8 >2.3.  Possibly due to dehydration.  No infectious signs or symptoms.  IV fluid hydration and repeat lactic acid level.  Scalp laceration -Stapled in the ED and no active bleeding, will need staples removed in 7 days.  Hypertension -Hold antihypertensives, orthostatics pending  Hyperlipidemia -Resume statin after pharmacy med rec is done  OSA -Continue CPAP at night  Insulin-dependent type 2 diabetes -A1c 7.4 on 08/05/2020.  Order sliding scale insulin moderate ACHS.  Resume home basal insulin after pharmacy med rec is done.  Chronic microcytic anemia -Hemoglobin 9.3, at baseline.  Continue to monitor.  History of hypothyroidism -On Synthroid at home.  TSH low at 0.23.  Check T3 and free T4 levels.  DVT prophylaxis: SCDs Code Status: Patient wishes to be full code. Family Communication: No family available at this time. Disposition Plan: Status is: Observation  The patient remains OBS appropriate and will d/c before 2 midnights.  Dispo: The patient is from: Home              Anticipated d/c is to: Home              Anticipated d/c date is: 2 days              Patient currently is not medically stable to d/c.  The medical decision making on this patient was of high complexity and the patient is at high risk for clinical deterioration, therefore this is a level 3 visit.  Shela Leff MD [facility]  If 7PM-7AM, please contact night-coverage [email or web address]  09/28/2020, 8:25 AM

## 2020-09-28 NOTE — ED Notes (Signed)
Pt to MRI

## 2020-09-28 NOTE — Consult Note (Signed)
Reason for Consult: Subdural collection Referring Physician: Dr. [name] is an 79 y.o. female.  HPI: Jeanne Kim is a 79 year old individual who apparently has had a couple of falls.  She sustained a small subdural fluid collection in the frontal region in addition to possible small epidural collection that was not initially appreciated on her CT scan but was noted on retrospect when she had an MRI performed yesterday.  Patient has been having some confusional episodes and some unsteadiness of gait.  She is currently being admitted for a syncopal work-up as she has had some more recent increase in falls.  Her examination demonstrates that she has no evidence of a cortical drift and her motor strength appears symmetric face is symmetric tongue and uvula in the midline sclera and conjunctiva are clear cranial nerve examination is normal.  Past Medical History:  Diagnosis Date  . Allergy   . Anemia   . Anginal pain ([place])   . Anxiety   . Arthritis   . Blood transfusion without reported diagnosis   . Breast cancer ([place])   . Carotid artery plaque    bilat  . Cataract   . Colon polyps    adenomatous  . Concussion 02/2016   Driving restrictions for 6w  . Coronary heart disease   . Depression   . Diverticulosis   . Dizziness   . Esophageal reflux   . Esophageal stricture   . Fibromyalgia   . Gallstones   . Hiatal hernia   . Hypercholesterolemia   . Hypertension   . Hypothyroidism   . IBS (irritable bowel syndrome)   . Kidney stones   . Osteoporosis   . Pneumonia    recently, just finished ABO  . Pneumonia   . PONV (postoperative nausea and vomiting)   . Sleep apnea    CPAP  . Thyroid disease 2002  . Type II or unspecified type diabetes mellitus without mention of complication, not stated as uncontrolled   . Unspecified gastritis and gastroduodenitis without mention of hemorrhage     Past Surgical History:  Procedure Laterality Date  . ANAL RECTAL MANOMETRY  N/A 08/05/2015   Procedure: ANO RECTAL MANOMETRY;  Surgeon: Mauri Pole, MD;  [facility]: [facility];  Service: Endoscopy;  Laterality: N/A;  . APPENDECTOMY    . BIOPSY  08/06/2020   Procedure: BIOPSY;  Surgeon: Eloise Harman, DO;  Location: [facility];  Service: Endoscopy;;  . BREAST LUMPECTOMY     left  . CARDIAC CATHETERIZATION    . CATARACT EXTRACTION W/PHACO Left 03/30/2013   Procedure: CATARACT EXTRACTION PHACO AND INTRAOCULAR LENS PLACEMENT (IOC);  Surgeon: Tonny Branch, MD;  Location: [facility];  Service: Ophthalmology;  Laterality: Left;  CDE:19.28  . CATARACT EXTRACTION W/PHACO Right 04/09/2013   Procedure: CATARACT EXTRACTION PHACO AND INTRAOCULAR LENS PLACEMENT (IOC);  Surgeon: Tonny Branch, MD;  Location: [facility];  Service: Ophthalmology;  Laterality: Right;  CDE: 15.44  . CERVICAL FUSION    . CESAREAN SECTION    . CHOLECYSTECTOMY    . CYSTOCELE REPAIR    . DILATION AND CURETTAGE OF UTERUS    . ESOPHAGOGASTRODUODENOSCOPY (EGD) WITH PROPOFOL N/A 08/06/2020   Procedure: ESOPHAGOGASTRODUODENOSCOPY (EGD) WITH PROPOFOL;  Surgeon: Eloise Harman, DO;  [facility];  Service: Endoscopy;  Laterality: N/A;  . EYE SURGERY Left 03-30-13   Cataract  . EYE SURGERY Right 04-09-13   Cataract  . RECTOCELE REPAIR    . THYROIDECTOMY    .  TONSILLECTOMY    . TOTAL ABDOMINAL HYSTERECTOMY      Family History  Problem Relation Age of Onset  . Sarcoidosis Mother   . Diabetes Mother   . Cancer Mother        sarcoma -  Right arm amputation  . Hypertension Mother   . Lung cancer Father   . Diabetes Father   . Heart disease Father        Before age 9  . Urolithiasis Father   . Cancer Father        Lung  . Hyperlipidemia Father   . Hypertension Father   . Esophageal cancer Maternal Uncle   . Ovarian cancer Maternal Aunt   . Colon cancer Maternal Uncle   . Lung cancer Maternal Uncle   . Diabetes Maternal Uncle   . Diabetes Maternal Aunt   . Colon polyps Maternal Uncle    . Heart disease Paternal Grandmother     Social History:  reports that she has never smoked. She has never used smokeless tobacco. She reports that she does not drink alcohol and does not use drugs.  Allergies:  Allergies  Allergen Reactions  . Codeine Hives  . Sulfonamide Derivatives Hives    Medications: I have reviewed the patient's current medications.  Results for orders placed or performed during the hospital encounter of 09/27/20 (from the past 48 hour(s))  CBC with Differential     Status: Abnormal   Collection Time: 09/27/20  9:08 PM  Result Value Ref Range   WBC 13.2 (H) 4.0 - 10.5 K/uL   RBC 4.37 3.87 - 5.11 MIL/uL   Hemoglobin 9.3 (L) 12.0 - 15.0 g/dL   HCT 33.2 (L) 36.0 - 46.0 %   MCV 76.0 (L) 80.0 - 100.0 fL   MCH 21.3 (L) 26.0 - 34.0 pg   MCHC 28.0 (L) 30.0 - 36.0 g/dL   RDW 17.5 (H) 11.5 - 15.5 %   Platelets PLATELET CLUMPS NOTED ON SMEAR, UNABLE TO ESTIMATE 150 - 400 K/uL   nRBC 0.0 0.0 - 0.2 %   Neutrophils Relative % 67 %   Neutro Abs 9.0 (H) 1.7 - 7.7 K/uL   Lymphocytes Relative 17 %   Lymphs Abs 2.3 0.7 - 4.0 K/uL   Monocytes Relative 11 %   Monocytes Absolute 1.4 (H) 0.1 - 1.0 K/uL   Eosinophils Relative 3 %   Eosinophils Absolute 0.3 0.0 - 0.5 K/uL   Basophils Relative 1 %   Basophils Absolute 0.1 0.0 - 0.1 K/uL   Immature Granulocytes 1 %   Abs Immature Granulocytes 0.08 (H) 0.00 - 0.07 K/uL    Comment: Performed at [facility], [address], [place], [place] [address]  Comprehensive metabolic panel     Status: Abnormal   Collection Time: 09/27/20  9:08 PM  Result Value Ref Range   Sodium 130 (L) 135 - 145 mmol/L   Potassium 4.4 3.5 - 5.1 mmol/L   Chloride 95 (L) 98 - 111 mmol/L   CO2 22 22 - 32 mmol/L   Glucose, Bld 149 (H) 70 - 99 mg/dL    Comment: Glucose reference range applies only to samples taken after fasting for at least 8 hours.   BUN 19 8 - 23 mg/dL   Creatinine, Ser 1.26 (H) 0.44 - 1.00 mg/dL   Calcium 9.2 8.9 - 10.3  mg/dL   Total Protein 7.0 6.5 - 8.1 g/dL   Albumin 3.9 3.5 - 5.0 g/dL   AST 26  15 - 41 U/L   ALT 11 0 - 44 U/L   Alkaline Phosphatase 60 38 - 126 U/L   Total Bilirubin 0.9 0.3 - 1.2 mg/dL   GFR, Estimated 44 (L) >60 mL/min    Comment: (NOTE) Calculated using the CKD-EPI Creatinine Equation (2021)    Anion gap 13 5 - 15    Comment: Performed at [facility] [address], [place], [place] [address]  Troponin I (High Sensitivity)     Status: None   Collection Time: 09/27/20  9:08 PM  Result Value Ref Range   Troponin I (High Sensitivity) 5 <18 ng/L    Comment: (NOTE) Elevated high sensitivity troponin I (hsTnI) values and significant  changes across serial measurements may suggest ACS but many other  chronic and acute conditions are known to elevate hsTnI results.  Refer to the "Links" section for chest pain algorithms and additional  guidance. Performed at [facility], [place] [address] [place], [place] [address]   Lactic acid, plasma     Status: Abnormal   Collection Time: 09/27/20  9:12 PM  Result Value Ref Range   Lactic Acid, Venous 2.8 (HH) 0.5 - 1.9 mmol/L    Comment: CRITICAL RESULT CALLED TO, READ BACK BY AND VERIFIED WITH: DAVIDSON A,RN 09/27/20 2309 WAYK Performed at [facility], [place] [address] [place], [place] [address]   Ethanol     Status: None   Collection Time: 09/27/20  9:13 PM  Result Value Ref Range   Alcohol, Ethyl (B) <10 <10 mg/dL    Comment: (NOTE) Lowest detectable limit for serum alcohol is 10 mg/dL.  For medical purposes only. Performed at [facility], [place] [address] [place], [place] [address]   POC CBG, ED     Status: Abnormal   Collection Time: 09/27/20  9:30 PM  Result Value Ref Range   Glucose-Capillary 144 (H) 70 - 99 mg/dL    Comment: Glucose reference range applies only to samples taken after fasting for at least 8 hours.   Comment 1 Notify RN    Comment 2 Document in Chart   I-stat chem 8, ED (not at [facility]  or [facility])     Status: Abnormal   Collection Time: 09/27/20  9:35 PM  Result Value Ref Range   Sodium 133 (L) 135 - 145 mmol/L   Potassium 4.2 3.5 - 5.1 mmol/L   Chloride 95 (L) 98 - 111 mmol/L   BUN 21 8 - 23 mg/dL   Creatinine, Ser 1.20 (H) 0.44 - 1.00 mg/dL   Glucose, Bld 144 (H) 70 - 99 mg/dL    Comment: Glucose reference range applies only to samples taken after fasting for at least 8 hours.   Calcium, Ion 1.16 1.15 - 1.40 mmol/L   TCO2 26 22 - 32 mmol/L   Hemoglobin 10.9 (L) 12.0 - 15.0 g/dL   HCT 32.0 (L) 36.0 - 46.0 %  I-Stat venous blood gas, [facility] ED)     Status: Abnormal   Collection Time: 09/27/20  9:37 PM  Result Value Ref Range   pH, Ven 7.418 7.250 - 7.430   pCO2, Ven 47.4 44.0 - 60.0 mmHg   pO2, Ven 36.0 32.0 - 45.0 mmHg   Bicarbonate 30.6 (H) 20.0 - 28.0 mmol/L   TCO2 32 22 - 32 mmol/L   O2 Saturation 69.0 %   Acid-Base Excess 5.0 (H) 0.0 - 2.0 mmol/L   Sodium 134 (L) 135 - 145 mmol/L  Potassium 4.2 3.5 - 5.1 mmol/L   Calcium, Ion 1.14 (L) 1.15 - 1.40 mmol/L   HCT 31.0 (L) 36.0 - 46.0 %   Hemoglobin 10.5 (L) 12.0 - 15.0 g/dL   Sample type VENOUS    Comment NOTIFIED PHYSICIAN   Lactic acid, plasma     Status: Abnormal   Collection Time: 09/28/20  1:00 AM  Result Value Ref Range   Lactic Acid, Venous 2.3 (HH) 0.5 - 1.9 mmol/L    Comment: CRITICAL VALUE NOTED.  VALUE IS CONSISTENT WITH PREVIOUSLY REPORTED AND CALLED VALUE. Performed at [facility], [place] [address], [place], [place] [address]   Troponin I (High Sensitivity)     Status: None   Collection Time: 09/28/20  1:00 AM  Result Value Ref Range   Troponin I (High Sensitivity) 8 <18 ng/L    Comment: (NOTE) Elevated high sensitivity troponin I (hsTnI) values and significant  changes across serial measurements may suggest ACS but many other  chronic and acute conditions are known to elevate hsTnI results.  Refer to the "Links" section for chest pain algorithms and additional  guidance. Performed at  [facility], [place] [address], [place],  [address]   Resp Panel by RT-PCR (Flu A&B, Covid) Nasopharyngeal Swab     Status: None   Collection Time: 09/28/20  1:03 AM   Specimen: Nasopharyngeal Swab; Nasopharyngeal(NP) swabs in vial transport medium  Result Value Ref Range   SARS Coronavirus 2 by RT PCR NEGATIVE NEGATIVE    Comment: (NOTE) SARS-CoV-2 target nucleic acids are NOT DETECTED.  The SARS-CoV-2 RNA is generally detectable in upper respiratory specimens during the acute phase of infection. The lowest concentration of SARS-CoV-2 viral copies this assay can detect is 138 copies/mL. A negative result does not preclude SARS-Cov-2 infection and should not be used as the sole basis for treatment or other patient management decisions. A negative result may occur with  improper specimen collection/handling, submission of specimen other than nasopharyngeal swab, presence of viral mutation(s) within the areas targeted by this assay, and inadequate number of viral copies(<138 copies/mL). A negative result must be combined with clinical observations, patient history, and epidemiological information. The expected result is Negative.  Fact Sheet for Patients:  EntrepreneurPulse.com.au  Fact Sheet for Healthcare Providers:  IncredibleEmployment.be  This test is no t yet approved or cleared by the [place] FDA and  has been authorized for detection and/or diagnosis of SARS-CoV-2 by FDA under an Emergency Use Authorization (EUA). This EUA will remain  in effect (meaning this test can be used) for the duration of the COVID-19 declaration under Section 564(b)(1) of the Act, 21 U.S.C.section 360bbb-3(b)(1), unless the authorization is terminated  or revoked sooner.       Influenza A by PCR NEGATIVE NEGATIVE   Influenza B by PCR NEGATIVE NEGATIVE    Comment: (NOTE) The Xpert Xpress SARS-CoV-2/FLU/RSV plus assay is intended as an aid in  the diagnosis of influenza from Nasopharyngeal swab specimens and should not be used as a sole basis for treatment. Nasal washings and aspirates are unacceptable for Xpert Xpress SARS-CoV-2/FLU/RSV testing.  Fact Sheet for Patients: EntrepreneurPulse.com.au  Fact Sheet for Healthcare Providers: IncredibleEmployment.be  This test is not yet approved or cleared by the [place] FDA and has been authorized for detection and/or diagnosis of SARS-CoV-2 by FDA under an Emergency Use Authorization (EUA). This EUA will remain in effect (meaning this test can be used) for the duration of the COVID-19 declaration under  Section 564(b)(1) of the Act, 21 U.S.C. section 360bbb-3(b)(1), unless the authorization is terminated or revoked.  Performed at [facility], [place] [address] [place], [place] [address]   TSH     Status: Abnormal   Collection Time: 09/28/20  4:24 AM  Result Value Ref Range   TSH 0.231 (L) 0.350 - 4.500 uIU/mL    Comment: Performed by a 3rd Generation assay with a functional sensitivity of <=0.01 uIU/mL. Performed at [facility], [place] [address], [place], [place] [address]   Cortisol-am, blood     Status: Abnormal   Collection Time: 09/28/20  4:24 AM  Result Value Ref Range   Cortisol - AM 2.8 (L) 6.7 - 22.6 ug/dL    Comment: Performed at [facility] [address] [place], [place] [address]  Vitamin B12     Status: None   Collection Time: 09/28/20  4:24 AM  Result Value Ref Range   Vitamin B-12 218 180 - 914 pg/mL    Comment: (NOTE) This assay is not validated for testing neonatal or myeloproliferative syndrome specimens for Vitamin B12 levels. Performed at [facility], [place] [address], [place], [place] [address]   Folate     Status: None   Collection Time: 09/28/20  4:24 AM  Result Value Ref Range   Folate 19.8 >5.9 ng/mL    Comment: Performed at [facility] [address]  [place], [place] [address]  Urinalysis, Routine w reflex microscopic     Status: Abnormal   Collection Time: 09/28/20  4:27 AM  Result Value Ref Range   Color, Urine YELLOW YELLOW   APPearance CLEAR CLEAR   Specific Gravity, Urine 1.019 1.005 - 1.030   pH 5.0 5.0 - 8.0   Glucose, UA NEGATIVE NEGATIVE mg/dL   Hgb urine dipstick SMALL (A) NEGATIVE   Bilirubin Urine NEGATIVE NEGATIVE   Ketones, ur NEGATIVE NEGATIVE mg/dL   Protein, ur NEGATIVE NEGATIVE mg/dL   Nitrite NEGATIVE NEGATIVE   Leukocytes,Ua NEGATIVE NEGATIVE   RBC / HPF 0-5 0 - 5 RBC/hpf   WBC, UA 0-5 0 - 5 WBC/hpf   Bacteria, UA NONE SEEN NONE SEEN   Squamous Epithelial / LPF 0-5 0 - 5    Comment: Performed at [facility], [place] [address] [place], [place] [address]  Rapid urine drug screen (hospital performed)     Status: None   Collection Time: 09/28/20  4:27 AM  Result Value Ref Range   Opiates NONE DETECTED NONE DETECTED   Cocaine NONE DETECTED NONE DETECTED   Benzodiazepines NONE DETECTED NONE DETECTED   Amphetamines NONE DETECTED NONE DETECTED   Tetrahydrocannabinol NONE DETECTED NONE DETECTED   Barbiturates NONE DETECTED NONE DETECTED    Comment: (NOTE) DRUG SCREEN FOR MEDICAL PURPOSES ONLY.  IF CONFIRMATION IS NEEDED FOR ANY PURPOSE, NOTIFY LAB WITHIN 5 DAYS.  LOWEST DETECTABLE LIMITS FOR URINE DRUG SCREEN Drug Class                     Cutoff (ng/mL) Amphetamine and metabolites    1000 Barbiturate and metabolites    200 Benzodiazepine                 295 Tricyclics and metabolites     300 Opiates and metabolites        300 Cocaine and metabolites        300 THC  50 Performed at [facility], [place] [address], [place], [place] [id number]   Basic metabolic panel     Status: Abnormal   Collection Time: 09/28/20  7:30 AM  Result Value Ref Range   Sodium 136 135 - 145 mmol/L   Potassium 3.5 3.5 - 5.1 mmol/L   Chloride 98 98 - 111 mmol/L   CO2 28 22 - 32 mmol/L   Glucose,  Bld 92 70 - 99 mg/dL    Comment: Glucose reference range applies only to samples taken after fasting for at least 8 hours.   BUN 14 8 - 23 mg/dL   Creatinine, Ser 1.00 0.44 - 1.00 mg/dL   Calcium 8.8 (L) 8.9 - 10.3 mg/dL   GFR, Estimated 58 (L) >60 mL/min    Comment: (NOTE) Calculated using the CKD-EPI Creatinine Equation (2021)    Anion gap 10 5 - 15    Comment: Performed at [facility] [address] [place], [place] [address]  Magnesium     Status: Abnormal   Collection Time: 09/28/20  7:30 AM  Result Value Ref Range   Magnesium 1.1 (L) 1.7 - 2.4 mg/dL    Comment: Performed at [facility] [address] [place], [place] [address]  CBC     Status: Abnormal   Collection Time: 09/28/20  7:30 AM  Result Value Ref Range   WBC 15.0 (H) 4.0 - 10.5 K/uL   RBC 3.89 3.87 - 5.11 MIL/uL   Hemoglobin 8.4 (L) 12.0 - 15.0 g/dL    Comment: Reticulocyte Hemoglobin testing may be clinically indicated, consider ordering this additional test [id number]    HCT 29.3 (L) 36.0 - 46.0 %   MCV 75.3 (L) 80.0 - 100.0 fL   MCH 21.6 (L) 26.0 - 34.0 pg   MCHC 28.7 (L) 30.0 - 36.0 g/dL   RDW 17.4 (H) 11.5 - 15.5 %   Platelets 468 (H) 150 - 400 K/uL   nRBC 0.0 0.0 - 0.2 %    Comment: Performed at [facility], [place] [address] [place], [place] [address]  D-dimer, quantitative (not at [facility])     Status: Abnormal   Collection Time: 09/28/20  8:38 AM  Result Value Ref Range   D-Dimer, Quant 2.18 (H) 0.00 - 0.50 ug/mL-FEU    Comment: (NOTE) At the manufacturer cut-off value of 0.5 g/mL FEU, this assay has a negative predictive value of 95-100%.This assay is intended for use in conjunction with a clinical pretest probability (PTP) assessment model to exclude pulmonary embolism (PE) and deep venous thrombosis (DVT) in outpatients suspected of PE or DVT. Results should be correlated with clinical presentation. Performed at [facility], [place] [address], [place],  [place] [address]   Lactic acid, plasma     Status: None   Collection Time: 09/28/20  8:38 AM  Result Value Ref Range   Lactic Acid, Venous 1.0 0.5 - 1.9 mmol/L    Comment: Performed at [facility] [address], [place], [place] [address]  CBG monitoring, ED     Status: Abnormal   Collection Time: 09/28/20 12:59 PM  Result Value Ref Range   Glucose-Capillary 193 (H) 70 - 99 mg/dL    Comment: Glucose reference range applies only to samples taken after fasting for at least 8 hours.    EEG  Result Date: 09/28/2020 Lora Havens, MD     09/28/2020 12:26 PM Patient Name: SERRENA MEDLER MRN: [id number]  Epilepsy Attending: Lora Havens Referring Physician/Provider: Dr. Shela Leff Date: 09/28/2020 Duration: 26.06 mins Patient history: 79 year old female with suspected posttraumatic subdural and subarachnoid hemorrhage.  EEG to evaluate for seizures. Level of alertness: Awake, asleep AEDs during EEG study: None Technical aspects: This EEG study was done with scalp electrodes positioned according to the 10-20 International system of electrode placement. Electrical activity was acquired at a sampling rate of 500Hz  and reviewed with a high frequency filter of 70Hz  and a low frequency filter of 1Hz . EEG data were recorded continuously and digitally stored. Description: The posterior dominant rhythm consists of 8-9 Hz activity of moderate voltage (25-35 uV) seen predominantly in posterior head regions, symmetric and reactive to eye opening and eye closing. Sleep was characterized by vertex waves, sleep spindles (12 to 14 Hz), maximal frontocentral region. Hyperventilation and photic stimulation were not performed.   IMPRESSION: This study is within normal limits. No seizures or epileptiform discharges were seen throughout the recording. Lora Havens   CT Angio Head W or Wo Contrast  Addendum Date: 09/28/2020   ADDENDUM REPORT: 09/28/2020 04:51 ADDENDUM: At time of the initial dictation,  the noncontrast head portion of this exam was not available for review due to technical air. Images are now available for review, with the following findings. Age-related cerebral atrophy with mild chronic small vessel ischemic disease. Tiny subdural hemorrhage overlies the left parietal convexity, measuring no more than 2 mm in maximal thickness. Additional scant 1-2 mm extra-axial hemorrhage overlies the right frontal convexity without mass effect. Scattered small volume subarachnoid blood seen overlying the anterior frontal convexities bilaterally. Patchy small volume hemorrhage present within the right sylvian fissure. Few small foci of likely subarachnoid blood overlie the peripheral right cerebellum (series 1, image 10). No associated mass effect. No acute large vessel territory infarct. No mass lesion or midline shift. Mild ventricular prominence somewhat out of proportion to cortical sulcation. No hyperdense vessel. Scattered vascular calcifications noted within the carotid siphons. Small right parietal scalp laceration/contusion. No calvarial fracture. Globes and orbital soft tissues demonstrate no acute finding. Paranasal sinuses are largely clear. No significant mastoid effusion. IMPRESSION: 1. Small 2 mm subdural hemorrhage overlying the left parietal convexity, with additional 1-2 mm extra-axial collection overlying the anterior right frontal convexity. No associated mass effect. 2. Patchy small volume subarachnoid hemorrhage involving the anterior frontal lobes bilaterally, right sylvian fissure, and peripheral right cerebellum. 3. Small right parietal scalp contusion/laceration. No calvarial fracture. 4. Mild ventricular prominence somewhat out of proportion to cortical sulcation. While this finding may be related to underlying atrophy, a degree of NPH could be contributory. At time of this dictation, these findings have already been communicated to Dr. Marlowe Sax of the hospitalist service, communicated  at 4:05 a.m. on 09/28/2020. Electronically Signed   By: Jeannine Boga M.D.   On: 09/28/2020 04:51   Result Date: 09/28/2020 CLINICAL DATA:  Initial evaluation for neuro deficit, stroke suspected. EXAM: CT ANGIOGRAPHY HEAD AND NECK TECHNIQUE: Multidetector CT imaging of the head and neck was performed using the standard protocol during bolus administration of intravenous contrast. Multiplanar CT image reconstructions and MIPs were obtained to evaluate the vascular anatomy. Carotid stenosis measurements (when applicable) are obtained utilizing NASCET criteria, using the distal internal carotid diameter as the denominator. CONTRAST:  58mL OMNIPAQUE IOHEXOL 350 MG/ML SOLN COMPARISON:  Prior MRA from 02/21/2012. FINDINGS: CTA NECK FINDINGS Aortic arch: Visualized aortic arch of normal caliber with normal branch pattern. Mild atheromatous change about the aortic arch and origin  of the great vessels. No hemodynamically significant stenosis about the origin of the great vessels. Right carotid system: Right CCA tortuous proximally but widely patent to the bifurcation. Minimal atheromatous plaque about the right bifurcation without stenosis. Right ICA widely patent distally to the skull base without stenosis, dissection or occlusion. Left carotid system: Left CCA tortuous proximally but is widely patent to the bifurcation without stenosis. Minimal atheromatous plaque about the left bifurcation without stenosis. Left ICA mildly tortuous but widely patent to the skull base without stenosis, dissection or occlusion. Vertebral arteries: Both vertebral arteries arise from subclavian arteries. No proximal subclavian artery stenosis. Vertebral arteries tortuous proximally but are widely patent without stenosis, dissection or occlusion. Skeleton: No visible acute osseous abnormality. No discrete or worrisome osseous lesions. Patient is status post prior fusion at C4 through C7. Other neck: No other acute soft tissue  abnormality within the neck. No mass or adenopathy. 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 Upper chest: Mild scattered ground-glass opacity with interlobular septal thickening within the visualized lungs suggest a degree of pulmonary interstitial edema. Visualized upper chest demonstrates no other acute finding. Review of the MIP images confirms the above findings CTA HEAD FINDINGS Anterior circulation: Petrous segments widely patent bilaterally. Scattered atheromatous change within the carotid siphons without significant stenosis. A1 segments widely patent. Normal anterior communicating artery complex. Anterior cerebral arteries widely patent to their distal aspects. No M1 stenosis or occlusion. Normal MCA bifurcations. Distal MCA branches well perfused and symmetric. Posterior circulation: Both V4 segments widely patent to the vertebrobasilar junction. Both PICA origins patent and normal. Basilar widely patent to its distal aspect. Superior cerebral arteries patent bilaterally. Both PCAs primarily supplied via the basilar well perfused to their distal aspects. Venous sinuses: Grossly patent allowing for timing the contrast bolus. Anatomic variants: None significant.  No intracranial aneurysm. Review of the MIP images confirms the above findings IMPRESSION: 1. Negative CTA for large vessel occlusion. 2. Mild for age atheromatous change about the major arterial vasculature of the head and neck as above. No hemodynamically significant or correctable stenosis. 3. Diffuse tortuosity of the major arterial vasculature of the neck, suggesting chronic underlying hypertension. 4. Scattered ground-glass opacity with interlobular septal thickening within the visualized lungs, suggesting a degree of pulmonary interstitial edema. Correlation with plain film radiography suggested. Electronically Signed: By: Jeannine Boga M.D. On: 09/27/2020 23:35   CT Angio Neck W and/or Wo Contrast  Addendum Date: 09/28/2020   ADDENDUM  REPORT: 09/28/2020 04:51 ADDENDUM: At time of the initial dictation, the noncontrast head portion of this exam was not available for review due to technical air. Images are now available for review, with the following findings. Age-related cerebral atrophy with mild chronic small vessel ischemic disease. Tiny subdural hemorrhage overlies the left parietal convexity, measuring no more than 2 mm in maximal thickness. Additional scant 1-2 mm extra-axial hemorrhage overlies the right frontal convexity without mass effect. Scattered small volume subarachnoid blood seen overlying the anterior frontal convexities bilaterally. Patchy small volume hemorrhage present within the right sylvian fissure. Few small foci of likely subarachnoid blood overlie the peripheral right cerebellum (series 1, image 10). No associated mass effect. No acute large vessel territory infarct. No mass lesion or midline shift. Mild ventricular prominence somewhat out of proportion to cortical sulcation. No hyperdense vessel. Scattered vascular calcifications noted within the carotid siphons. Small right parietal scalp laceration/contusion. No calvarial fracture. Globes and orbital soft tissues demonstrate no acute finding. Paranasal sinuses are largely clear. No significant mastoid effusion. IMPRESSION:  1. Small 2 mm subdural hemorrhage overlying the left parietal convexity, with additional 1-2 mm extra-axial collection overlying the anterior right frontal convexity. No associated mass effect. 2. Patchy small volume subarachnoid hemorrhage involving the anterior frontal lobes bilaterally, right sylvian fissure, and peripheral right cerebellum. 3. Small right parietal scalp contusion/laceration. No calvarial fracture. 4. Mild ventricular prominence somewhat out of proportion to cortical sulcation. While this finding may be related to underlying atrophy, a degree of NPH could be contributory. At time of this dictation, these findings have already been  communicated to Dr. Loney Lohathore of the hospitalist service, communicated at 4:05 a.m. on 09/28/2020. Electronically Signed   By: Rise MuBenjamin  McClintock M.D.   On: 09/28/2020 04:51   Result Date: 09/28/2020 CLINICAL DATA:  Initial evaluation for neuro deficit, stroke suspected. EXAM: CT ANGIOGRAPHY HEAD AND NECK TECHNIQUE: Multidetector CT imaging of the head and neck was performed using the standard protocol during bolus administration of intravenous contrast. Multiplanar CT image reconstructions and MIPs were obtained to evaluate the vascular anatomy. Carotid stenosis measurements (when applicable) are obtained utilizing NASCET criteria, using the distal internal carotid diameter as the denominator. CONTRAST:  75mL OMNIPAQUE IOHEXOL 350 MG/ML SOLN COMPARISON:  Prior MRA from 02/21/2012. FINDINGS: CTA NECK FINDINGS Aortic arch: Visualized aortic arch of normal caliber with normal branch pattern. Mild atheromatous change about the aortic arch and origin of the great vessels. No hemodynamically significant stenosis about the origin of the great vessels. Right carotid system: Right CCA tortuous proximally but widely patent to the bifurcation. Minimal atheromatous plaque about the right bifurcation without stenosis. Right ICA widely patent distally to the skull base without stenosis, dissection or occlusion. Left carotid system: Left CCA tortuous proximally but is widely patent to the bifurcation without stenosis. Minimal atheromatous plaque about the left bifurcation without stenosis. Left ICA mildly tortuous but widely patent to the skull base without stenosis, dissection or occlusion. Vertebral arteries: Both vertebral arteries arise from subclavian arteries. No proximal subclavian artery stenosis. Vertebral arteries tortuous proximally but are widely patent without stenosis, dissection or occlusion. Skeleton: No visible acute osseous abnormality. No discrete or worrisome osseous lesions. Patient is status post prior  fusion at C4 through C7. Other neck: No other acute soft tissue abnormality within the neck. No mass or adenopathy. 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 Upper chest: Mild scattered ground-glass opacity with interlobular septal thickening within the visualized lungs suggest a degree of pulmonary interstitial edema. Visualized upper chest demonstrates no other acute finding. Review of the MIP images confirms the above findings CTA HEAD FINDINGS Anterior circulation: Petrous segments widely patent bilaterally. Scattered atheromatous change within the carotid siphons without significant stenosis. A1 segments widely patent. Normal anterior communicating artery complex. Anterior cerebral arteries widely patent to their distal aspects. No M1 stenosis or occlusion. Normal MCA bifurcations. Distal MCA branches well perfused and symmetric. Posterior circulation: Both V4 segments widely patent to the vertebrobasilar junction. Both PICA origins patent and normal. Basilar widely patent to its distal aspect. Superior cerebral arteries patent bilaterally. Both PCAs primarily supplied via the basilar well perfused to their distal aspects. Venous sinuses: Grossly patent allowing for timing the contrast bolus. Anatomic variants: None significant.  No intracranial aneurysm. Review of the MIP images confirms the above findings IMPRESSION: 1. Negative CTA for large vessel occlusion. 2. Mild for age atheromatous change about the major arterial vasculature of the head and neck as above. No hemodynamically significant or correctable stenosis. 3. Diffuse tortuosity of the major arterial vasculature of the neck, suggesting  chronic underlying hypertension. 4. Scattered ground-glass opacity with interlobular septal thickening within the visualized lungs, suggesting a degree of pulmonary interstitial edema. Correlation with plain film radiography suggested. Electronically Signed: By: Rise Mu M.D. On: 09/27/2020 23:35   MR BRAIN WO  CONTRAST  Result Date: 09/28/2020 CLINICAL DATA:  Initial evaluation for acute syncope, fall, neuro deficit. EXAM: MRI HEAD WITHOUT CONTRAST TECHNIQUE: Multiplanar, multiecho pulse sequences of the brain and surrounding structures were obtained without intravenous contrast. COMPARISON:  Prior CTA from 09/27/2020. Additionally, comparison made with a prior noncontrast head CT from 09/27/2020, visible in PACS, but not completed at time of dictation of prior CTA or of this MRI, and not yet dictated. FINDINGS: Brain: Generalized age-related cerebral atrophy. Minimal chronic microvascular ischemic disease for age. No abnormal foci of restricted diffusion to suggest acute or subacute ischemia. Gray-white matter differentiation maintained. No encephalomalacia to suggest chronic cortical infarction. Thinning 2 mm subdural hematoma seen overlying the left parietal convexity (series 11, image 16). This is faintly visible on prior CT. There is an additional faint extra-axial collection overlying the right frontal convexity (series 11, image 15), extremely difficult to visualized on prior CT. This measures 1-2 mm in maximal thickness. Scattered small volume subarachnoid blood seen along both frontal convexities in within the right sylvian fissure. Patchy small volume hemorrhage seen at the peripheral right cerebellum (series 14, image 15). Findings are also visible on prior CT, and overall not significantly changed or worsened. No mass lesion, midline shift, or mass effect. Diffuse ventricular prominence somewhat out of proportion to cortical sulcation, which could reflect a degree of NPH. No frank hydrocephalus or transependymal flow of CSF. Empty sella noted.  Midline structures intact. Vascular: Major intracranial vascular flow voids are maintained. Skull and upper cervical spine: Craniocervical junction within normal limits. Bone marrow signal intensity normal. Right parietal scalp laceration with skin staples in place.  Sinuses/Orbits: Globes and orbital soft tissues demonstrate no acute finding. Patient status post bilateral ocular lens replacement. Mild scattered mucosal thickening noted with throughout the paranasal sinuses. Small left mastoid effusion noted, of doubtful significance. Inner ear structures grossly normal. Other: None. IMPRESSION: 1. Thin 2 mm subdural hematoma overlying the left parietal convexity, with additional 1-2 mm extra-axial collection overlying the right frontal convexity. No significant mass effect. 2. Scattered small volume posttraumatic subarachnoid hemorrhage overlying the bilateral frontal convexities, right sylvian fissure, and right cerebellum. 3. Diffuse ventricular prominence, somewhat out of proportion to cortical sulcation. While this finding may be related to underlying atrophy, a degree of NPH could be contributory. No transependymal flow of CSF. 4. No acute intracranial infarct or other abnormality. Critical Value/emergent results were called by telephone at the time of interpretation on 09/28/2020 at 4:05 am to provider Dr. Caleen Essex , who verbally acknowledged these results. Electronically Signed   By: Rise Mu M.D.   On: 09/28/2020 04:13   CT C-SPINE NO CHARGE  Result Date: 09/28/2020 CLINICAL DATA:  Initial evaluation for acute trauma, fall. EXAM: CT CERVICAL SPINE WITHOUT CONTRAST TECHNIQUE: Multidetector CT imaging of the cervical spine was performed without intravenous contrast. Multiplanar CT image reconstructions were also generated. COMPARISON:  Prior CTA from 09/27/2020. FINDINGS: Alignment: Straightening of the normal cervical lordosis. Trace 2 mm anterolisthesis of C7 on T1, T1 on T2, and T2 on T3, chronic and facet mediated. Skull base and vertebrae: Skull base intact. Normal C1-2 articulations are preserved in the dens is intact. Vertebral body height maintained. No acute or chronic fracture. No discrete osseous  lesions. Soft tissues and spinal canal:  Paraspinous soft tissues demonstrate no acute abnormality. No abnormal prevertebral edema. Vascular structures described on prior CTA. Disc levels: Prior fusion at C4 through C7. Solid arthrodesis at C5 through C7, with pseudoarthrosis at C4-5. The right screw at the level of C4 is fractured (series 6, image 28). Minimal periprosthetic lucency about the contralateral left screw of C4 (series 3, image 51). Hardware otherwise intact without evidence for failure or other complication central disc protrusion at C2-3 with resultant mild spinal stenosis (series 4, image 37). Upper chest: Findings suggestive of mild pulmonary interstitial edema again noted within the visualized lungs. Other: None. IMPRESSION: 1. No acute traumatic injury within the cervical spine. 2. Prior fusion at C4 through C7 with solid arthrodesis at C5 through C7, with pseudoarthrosis at C4-5. The right screw at the level of C4 is fractured. Minimal periprosthetic lucency about the contralateral left screw of C4 suggesting loosening. No other hardware complication. 3. Central disc protrusion at C2-3 with resultant mild spinal stenosis. 4. Findings suggestive of mild pulmonary interstitial edema within the visualized lungs. Electronically Signed   By: Jeannine Boga M.D.   On: 09/28/2020 03:11   DG Chest Portable 1 View  Result Date: 09/27/2020 CLINICAL DATA:  79 year old female with altered mental status. EXAM: PORTABLE CHEST 1 VIEW COMPARISON:  Chest radiograph dated 08/05/2020. FINDINGS: Minimal bibasilar atelectasis. No focal consolidation, pleural effusion, pneumothorax. The cardiac silhouette is within limits. No acute osseous pathology. Cervical spine ACDF. IMPRESSION: No active disease. Electronically Signed   By: Anner Crete M.D.   On: 09/27/2020 21:35   ECHOCARDIOGRAM COMPLETE  Result Date: 09/28/2020    ECHOCARDIOGRAM REPORT   Patient Name:   DALEYSSA PULLIAM Date of Exam: 09/28/2020 Medical Rec #:  [id number]      Height:        64.0 in Accession #:    [id number]     Weight:       172.0 lb Date of Birth:  [birth year]      BSA:          1.835 m Patient Age:    26 years       BP:           133/72 mmHg Patient Gender: F              HR:           87 bpm. Exam Location:  Inpatient Procedure: 2D Echo, Intracardiac Opacification Agent, Color Doppler and Cardiac            Doppler Indications:    Syncope 780.2 / R55  History:        Patient has prior history of Echocardiogram examinations, most                 recent 08/21/2019. CAD; Risk Factors:Hypertension and                 Dyslipidemia. Past history of breast cancer. GERD, thyroid.  Sonographer:    Darlina Sicilian RDCS Referring Phys: [id number] [place]  Sonographer Comments: EF estimateion by ED attempted. IMPRESSIONS  1. Left ventricular ejection fraction, by estimation, is 60 to 65%. The left ventricle has normal function. The left ventricle has no regional wall motion abnormalities. Left ventricular diastolic parameters are consistent with Grade I diastolic dysfunction (impaired relaxation).  2. Right ventricular systolic function is normal. The right ventricular size is normal. There is normal pulmonary artery systolic pressure. The estimated right ventricular  systolic pressure is 123456 mmHg.  3. The mitral valve is grossly normal. Trivial mitral valve regurgitation. No evidence of mitral stenosis.  4. The aortic valve is tricuspid. Aortic valve regurgitation is trivial. Mild aortic valve sclerosis is present, with no evidence of aortic valve stenosis.  5. The inferior vena cava is normal in size with greater than 50% respiratory variability, suggesting right atrial pressure of 3 mmHg. Comparison(s): No significant change from prior study. FINDINGS  Left Ventricle: Left ventricular ejection fraction, by estimation, is 60 to 65%. The left ventricle has normal function. The left ventricle has no regional wall motion abnormalities. Definity contrast agent was given IV to  delineate the left ventricular  endocardial borders. The left ventricular internal cavity size was normal in size. There is no left ventricular hypertrophy. Left ventricular diastolic parameters are consistent with Grade I diastolic dysfunction (impaired relaxation). Right Ventricle: The right ventricular size is normal. No increase in right ventricular wall thickness. Right ventricular systolic function is normal. There is normal pulmonary artery systolic pressure. The tricuspid regurgitant velocity is 2.66 m/s, and  with an assumed right atrial pressure of 3 mmHg, the estimated right ventricular systolic pressure is 123456 mmHg. Left Atrium: Left atrial size was normal in size. Right Atrium: Right atrial size was normal in size. Pericardium: Trivial pericardial effusion is present. Presence of pericardial fat pad. Mitral Valve: The mitral valve is grossly normal. Mild mitral annular calcification. Trivial mitral valve regurgitation. No evidence of mitral valve stenosis. Tricuspid Valve: The tricuspid valve is grossly normal. Tricuspid valve regurgitation is trivial. No evidence of tricuspid stenosis. Aortic Valve: The aortic valve is tricuspid. Aortic valve regurgitation is trivial. Mild aortic valve sclerosis is present, with no evidence of aortic valve stenosis. Pulmonic Valve: The pulmonic valve was grossly normal. Pulmonic valve regurgitation is not visualized. No evidence of pulmonic stenosis. Aorta: The aortic root and ascending aorta are structurally normal, with no evidence of dilitation. Venous: The inferior vena cava is normal in size with greater than 50% respiratory variability, suggesting right atrial pressure of 3 mmHg. IAS/Shunts: The atrial septum is grossly normal.  LEFT VENTRICLE PLAX 2D LVIDd:         4.80 cm  Diastology LVIDs:         3.10 cm  LV e' medial:    4.68 cm/s LV PW:         1.00 cm  LV E/e' medial:  16.9 LV IVS:        0.90 cm  LV e' lateral:   5.22 cm/s LVOT diam:     1.80 cm  LV E/e'  lateral: 15.1 LV SV:         48 LV SV Index:   26 LVOT Area:     2.54 cm                         3D Volume EF                         LV 3D EF:    52.50 %                         LV 3D EDV:   66800.00 mm                         LV 3D ESV:   31700.00 mm  LV 3D SV:    35000.00 mm RIGHT VENTRICLE RV S prime:     11.20 cm/s TAPSE (M-mode): 2.1 cm LEFT ATRIUM             Index LA diam:        3.30 cm 1.80 cm/m LA Vol (A2C):   28.3 ml 15.42 ml/m LA Vol (A4C):   59.0 ml 32.15 ml/m LA Biplane Vol: 41.8 ml 22.78 ml/m  AORTIC VALVE LVOT Vmax:   116.00 cm/s LVOT Vmean:  74.200 cm/s LVOT VTI:    0.188 m  AORTA Ao Root diam: 3.30 cm Ao Asc diam:  3.30 cm MITRAL VALVE                TRICUSPID VALVE MV Area (PHT): 3.21 cm     TR Peak grad:   28.3 mmHg MV Decel Time: 236 msec     TR Vmax:        266.00 cm/s MV E velocity: 79.00 cm/s MV A velocity: 121.00 cm/s  SHUNTS MV E/A ratio:  0.65         Systemic VTI:  0.19 m                             Systemic Diam: 1.80 cm Eleonore Chiquito MD Electronically signed by Eleonore Chiquito MD Signature Date/Time: 09/28/2020/11:16:01 AM    Final     Review of Systems  Unable to perform ROS: Acuity of condition   Blood pressure 133/74, pulse 82, temperature 99.1 F (37.3 C), resp. rate 18, height 5\' 4"  (1.626 m), weight 78 kg, SpO2 95 %. Physical Exam Constitutional:      Appearance: Normal appearance.  HENT:     Head: Atraumatic.     Right Ear: Tympanic membrane normal.     Left Ear: Tympanic membrane normal.     Nose: Nose normal.     Mouth/Throat:     Mouth: Mucous membranes are moist.  Eyes:     Extraocular Movements: Extraocular movements intact.     Pupils: Pupils are equal, round, and reactive to light.  Cardiovascular:     Rate and Rhythm: Regular rhythm.  Abdominal:     Palpations: Abdomen is soft.  Musculoskeletal:        General: Normal range of motion.  Skin:    General: Skin is warm and dry.  Neurological:     Mental Status: She  is alert.     Comments: See commentary and patient note     Assessment/Plan: Small subdural collections intracranially without shift or mass-effect.  Plan: These can simply be observed and I would not suggest any surgical intervention.  I believe that these are relatively asymptomatic though it may have caused some of the confusional episodes she experienced.  A follow-up scan in approximately months time may be advisable or sooner if the patient's clinical neurologic status changes however I believe that these processes will resolve themselves.  Blanchie Dessert Rosan Calbert 09/28/2020, 3:18 PM

## 2020-09-28 NOTE — Treatment Plan (Signed)
Treatment Plan  I was asked to review MRI and comment about enlarged ventricles and other findings.  This is most likely a post-traumatic subdural and subarachnoid. The subdural hematoma is not large enough for drainage, and causes no mass effect. The enlarged ventricles appear to be asymptomatic (no mention of classic NPH triad in the chart), and it's likely too soon to see obstructive hydrocephalus on the basis of minor subarachnoid. There is no aneurysm on the CTA (personally reviewed).   I suggest symptomatic headache management (though Dr. Bartholome Bill note indicates NO headache is reported), and short-term follow-up of head CT (6-12 hours after MRI, so 9am to 3pm today) to assess for SDH enlargement. If it's significantly larger, consult neurosurgery. Otherwise, continue symptomatic management. The SDH and SAH usually will resolve on their own.  I note that she has history of concussion (2017) that may be a predisposing factor to seizures. Therefore, EEG vs EEG monitoring may be appropriate. After EEG would give 1 gram keppra load and continue 500 mg q12h for at least 14d for seizure prophylaxis, regardless of EEG results. If the EEG supports epileptic tendencies, then I would continue anti-seizure medication. Will continue to follow along.  Perfecto Kingdom, MD

## 2020-09-28 NOTE — Evaluation (Signed)
Occupational Therapy Evaluation Patient Details Name: Jeanne Kim MRN: [id number] DOB: 06/02/1941 Today's Date: 09/28/2020    History of Present Illness Jeanne Kim is a 79 y.o. female with medical history significant of anxiety, arthritis, history of breast cancer, CAD, depression, GERD, hypertension, hyperlipidemia, hypothyroidism, IBS, OSA on CPAP, insulin-dependent type 2 diabetes presenting to the ED via EMS. For evaluation of syncope and altered mental status. Left parietal and right frontal SDH.   Clinical Impression   This 79 yo female admitted with above presents to acute with PLOF of being totally independent with all basic ADLs, IADLs, and driving. She currently is setup/S-Mod A for all basic ADLs due to dizziness with movement. She will continue to benefit from acute OT without need for followup.    Follow Up Recommendations  No OT follow up    Equipment Recommendations  None recommended by OT       Precautions / Restrictions Precautions Precautions: Fall Restrictions Weight Bearing Restrictions: No      Mobility Bed Mobility Overal bed mobility: Needs Assistance Bed Mobility: Supine to Sit;Sit to Supine     Supine to sit: Min guard;+2 for safety/equipment Sit to supine: Min guard;+2 for safety/equipment   General bed mobility comments: Pt reporting dizziness with transition to sitting. Requiring increased time and effort to come to sitting.    Transfers Overall transfer level: Needs assistance Equipment used: 2 person hand held assist Transfers: Sit to/from Stand Sit to Stand: Min assist;Min guard;+2 safety/equipment         General transfer comment: Required assist for steadying to come to standing. Reporting mild dizziness initially that resolved.    Balance Overall balance assessment: Needs assistance Sitting-balance support: No upper extremity supported Sitting balance-Leahy Scale: Fair     Standing balance support: Single extremity  supported Standing balance-Leahy Scale: Poor Standing balance comment: Reliant on UE support                           ADL either performed or assessed with clinical judgement   ADL Overall ADL's : Needs assistance/impaired Eating/Feeding: Independent;Sitting   Grooming: Supervision/safety;Set up;Sitting   Upper Body Bathing: Supervision/ safety;Set up;Sitting   Lower Body Bathing: Moderate assistance Lower Body Bathing Details (indicate cue type and reason): min A sit<>stand Upper Body Dressing : Set up;Supervision/safety;Sitting   Lower Body Dressing: Moderate assistance Lower Body Dressing Details (indicate cue type and reason): min A sit<>stand Toilet Transfer: Minimal assistance;Stand-pivot   Toileting- Clothing Manipulation and Hygiene: Minimal assistance;Sit to/from stand               Vision Patient Visual Report: No change from baseline              Pertinent Vitals/Pain Pain Assessment: 0-10 Pain Score: 5  Pain Location: headache Pain Descriptors / Indicators: Headache Pain Intervention(s): Limited activity within patient's tolerance;Premedicated before session;Repositioned     Hand Dominance Right   Extremity/Trunk Assessment Upper Extremity Assessment Upper Extremity Assessment: Overall WFL for tasks assessed   Lower Extremity Assessment Lower Extremity Assessment: Generalized weakness   Cervical / Trunk Assessment Cervical / Trunk Assessment: Normal   Communication Communication Communication: No difficulties   Cognition Arousal/Alertness: Awake/alert Behavior During Therapy: WFL for tasks assessed/performed Overall Cognitive Status: No family/caregiver present to determine baseline cognitive functioning  General Comments: Pt A and O X4. Decreased awareness of safety   General Comments  No nystagmus noted with visual tracking, however, did report when she turned her head. Did have  drop in BP from supine to sitting, but did not have drop with standing. (BP supine133/73; BP sitting 104/87 mmHg; BP after prolonged standing 140/76 mmHg).            Home Living Family/patient expects to be discharged to:: Private residence Living Arrangements: Spouse/significant other Available Help at Discharge: Family;Available 24 hours/day Type of Home: House Home Access: Stairs to enter [employer] of Steps: 2 Entrance Stairs-Rails: Right Home Layout: Laundry or work area in basement;Able to live on main level with bedroom/bathroom     Bathroom Shower/Tub: Chief Strategy Officer: Handicapped height     Home Equipment: Hand held shower head;Grab bars - tub/shower;Shower seat;Walker - 2 wheels;Cane - single point          Prior Functioning/Environment Level of Independence: Independent                 OT Problem List: Decreased activity tolerance;Impaired balance (sitting and/or standing)      OT Treatment/Interventions: Self-care/ADL training;DME and/or AE instruction;Patient/family education;Balance training    OT Goals(Current goals can be found in the care plan section) Acute Rehab OT Goals Patient Stated Goal: to feel better OT Goal Formulation: With patient Time For Goal Achievement: 10/12/20 Potential to Achieve Goals: Good  OT Frequency: Min 2X/week           Co-evaluation PT/OT/SLP Co-Evaluation/Treatment: Yes Reason for Co-Treatment: For patient/therapist safety;To address functional/ADL transfers PT goals addressed during session: Mobility/safety with mobility;Balance OT goals addressed during session: ADL's and self-care;Strengthening/ROM      AM-PAC OT "6 Clicks" Daily Activity     Outcome Measure Help from another person eating meals?: None Help from another person taking care of personal grooming?: A Little Help from another person toileting, which includes using toliet, bedpan, or urinal?: A Little Help from  another person bathing (including washing, rinsing, drying)?: A Lot Help from another person to put on and taking off regular upper body clothing?: A Little Help from another person to put on and taking off regular lower body clothing?: A Lot 6 Click Score: 17   End of Session Equipment Utilized During Treatment: Gait belt  Activity Tolerance:  (limited by dizziness) Patient left: in bed;with call bell/phone within reach (both stretcher side rails up)  OT Visit Diagnosis: Unsteadiness on feet (R26.81);Other abnormalities of gait and mobility (R26.89);Dizziness and giddiness (R42)                Time: 7124-5809 OT Time Calculation (min): 17 min Charges:  OT General Charges $OT Visit: 1 Visit OT Evaluation $OT Eval Moderate Complexity: 1 Mod  Ignacia Palma, OTR/L Acute [employer] Pager [phone] Office [phone]     Evette Georges 09/28/2020, 11:21 AM

## 2020-09-28 NOTE — Procedures (Signed)
Patient Name: Jeanne Kim  MRN: [id number]  Epilepsy Attending: Lora Havens  Referring Physician/Provider: Dr. Shela Leff Date: 09/28/2020 Duration: 26.06 mins  Patient history: 79 year old female with suspected posttraumatic subdural and subarachnoid hemorrhage.  EEG to evaluate for seizures.  Level of alertness: Awake, asleep  AEDs during EEG study: None  Technical aspects: This EEG study was done with scalp electrodes positioned according to the 10-20 International system of electrode placement. Electrical activity was acquired at a sampling rate of 500Hz  and reviewed with a high frequency filter of 70Hz  and a low frequency filter of 1Hz . EEG data were recorded continuously and digitally stored.   Description: The posterior dominant rhythm consists of 8-9 Hz activity of moderate voltage (25-35 uV) seen predominantly in posterior head regions, symmetric and reactive to eye opening and eye closing. Sleep was characterized by vertex waves, sleep spindles (12 to 14 Hz), maximal frontocentral region. Hyperventilation and photic stimulation were not performed.    IMPRESSION: This study is within normal limits. No seizures or epileptiform discharges were seen throughout the recording.  [name] [name]

## 2020-09-28 NOTE — Evaluation (Signed)
Physical Therapy Evaluation Patient Details Name: Jeanne Kim MRN: [id number] DOB: [birth year] Today's Date: 09/28/2020   History of Present Illness  Jeanne Kim is a 78 y.o. female with medical history significant of anxiety, arthritis, history of breast cancer, CAD, depression, GERD, hypertension, hyperlipidemia, hypothyroidism, IBS, OSA on CPAP, insulin-dependent type 2 diabetes presenting to the ED via EMS. For evaluation of syncope and altered mental status. Left parietal and right frontal SDH.  Clinical Impression  Pt admitted secondary to problem above with deficits below. Seen in ED with OT for increased safety. Pt complaining of dizziness upon sitting upright and mild dizziness when standing. Pt very guarded with mobility and unsteady requiring min guard to min A. Feel pt would benefit from continued acute PT, particularly vestibular assessment, prior to d/c. Will continue to follow acutely and update recommendations based on pt progression.     Follow Up Recommendations Supervision/Assistance - 24 hour;Other (comment) (TBD pending progression)    Equipment Recommendations  Other (comment) (TBD)    Recommendations for Other Services       Precautions / Restrictions Precautions Precautions: Fall Restrictions Weight Bearing Restrictions: No      Mobility  Bed Mobility Overal bed mobility: Needs Assistance Bed Mobility: Supine to Sit;Sit to Supine     Supine to sit: Min guard;+2 for safety/equipment Sit to supine: Min guard;+2 for safety/equipment   General bed mobility comments: Pt reporting dizziness with transition to sitting. Requiring increased time and effort to come to sitting.    Transfers Overall transfer level: Needs assistance Equipment used: 2 person hand held assist Transfers: Sit to/from Stand Sit to Stand: Min assist;Min guard;+2 safety/equipment         General transfer comment: Required assist for steadying to come to standing. Reporting  mild dizziness initially that resolved.  Ambulation/Gait Ambulation/Gait assistance: Min assist   Assistive device: 1 person hand held assist   Gait velocity: Decreased   General Gait Details: Pt with decreased tolerance and only able to tolerate side stepping at edge of bed. Pt very guarded and unsteady requiring HHA and min A to maintain.  Stairs            Wheelchair Mobility    Modified Rankin (Stroke Patients Only)       Balance Overall balance assessment: Needs assistance Sitting-balance support: No upper extremity supported Sitting balance-Leahy Scale: Fair     Standing balance support: Single extremity supported Standing balance-Leahy Scale: Poor Standing balance comment: Reliant on UE support                             Pertinent Vitals/Pain Pain Assessment: 0-10 Pain Score: 5  Pain Location: headache Pain Descriptors / Indicators: Headache Pain Intervention(s): Limited activity within patient's tolerance;Premedicated before session;Repositioned    Home Living Family/patient expects to be discharged to:: Private residence Living Arrangements: Spouse/significant other Available Help at Discharge: Family;Available 24 hours/day Type of Home: House Home Access: Stairs to enter Entrance Stairs-Rails: Right Entrance Stairs-Number of Steps: 2 Home Layout: Laundry or work area in basement;Able to live on main level with bedroom/bathroom Home Equipment: Hand held shower head;Grab bars - tub/shower;Shower seat;Walker - 2 wheels;Cane - single point      Prior Function Level of Independence: Independent               Hand Dominance   Dominant Hand: Right    Extremity/Trunk Assessment   Upper Extremity Assessment Upper Extremity Assessment: Defer  to OT evaluation    Lower Extremity Assessment Lower Extremity Assessment: Generalized weakness    Cervical / Trunk Assessment Cervical / Trunk Assessment: Normal  Communication    Communication: No difficulties  Cognition Arousal/Alertness: Awake/alert Behavior During Therapy: WFL for tasks assessed/performed Overall Cognitive Status: No family/caregiver present to determine baseline cognitive functioning                                 General Comments: Pt A and O X4. Decreased awareness of safety      General Comments General comments (skin integrity, edema, etc.): No nystagmus noted with visual tracking, however, did report when she turned her head. Did have drop in BP from supine to sitting, but did not have drop with standing. (BP supine133/73; BP sitting 104/87 mmHg; BP after prolonged standing 140/76 mmHg).    Exercises     Assessment/Plan    PT Assessment Patient needs continued PT services  PT Problem List Decreased strength;Decreased balance;Decreased mobility;Decreased activity tolerance;Decreased knowledge of use of DME;Decreased knowledge of precautions       PT Treatment Interventions Gait training;DME instruction;Stair training;Functional mobility training;Therapeutic activities;Therapeutic exercise;Balance training;Patient/family education    PT Goals (Current goals can be found in the Care Plan section)  Acute Rehab PT Goals Patient Stated Goal: to feel better PT Goal Formulation: With patient Time For Goal Achievement: 10/11/20 Potential to Achieve Goals: Good    Frequency Min 3X/week   Barriers to discharge        Co-evaluation PT/OT/SLP Co-Evaluation/Treatment: Yes Reason for Co-Treatment: To address functional/ADL transfers;For patient/therapist safety PT goals addressed during session: Balance;Mobility/safety with mobility         AM-PAC PT "6 Clicks" Mobility  Outcome Measure Help needed turning from your back to your side while in a flat bed without using bedrails?: A Little Help needed moving from lying on your back to sitting on the side of a flat bed without using bedrails?: A Little Help needed moving  to and from a bed to a chair (including a wheelchair)?: A Little Help needed standing up from a chair using your arms (e.g., wheelchair or bedside chair)?: A Little Help needed to walk in hospital room?: A Lot Help needed climbing 3-5 steps with a railing? : Total 6 Click Score: 15    End of Session Equipment Utilized During Treatment: Gait belt Activity Tolerance: Treatment limited secondary to medical complications (Comment) (dizziness) Patient left: in bed;with call bell/phone within reach (on stretcher in ED) Nurse Communication: Mobility status PT Visit Diagnosis: Unsteadiness on feet (R26.81);Muscle weakness (generalized) (M62.81)    Time: [id number] PT Time Calculation (min) (ACUTE ONLY): 17 min   Charges:   PT Evaluation $PT Eval Moderate Complexity: 1 Mod          Reuel Derby, PT, DPT  Acute Rehabilitation Services  Pager: [phone] Office: [phone]   Rudean Hitt 09/28/2020, 11:12 AM

## 2020-09-28 NOTE — Plan of Care (Signed)
Neuro-B12 level low normal. Start Vit B12 1000 mcg po qd and have patient f/up with PCP to assess response. Folate normal.  Clance Boll, NP

## 2020-09-28 NOTE — Progress Notes (Signed)
EEG complete - results pending 

## 2020-09-28 NOTE — Consult Note (Signed)
NEUROLOGY CONSULTATION NOTE   Date of service: September 28, 2020 Patient Name: Jeanne Kim MRN:  [id number] DOB:  1941-08-24 Reason for consult: "L sided symptoms" _ _ _   _ __   _ __ _ _  __ __   _ __   __ _  History of Present Illness  Jeanne Kim is a 79 y.o. female with PMH significant for anemia, GERD, CAD, HTN, HLD, hypothyroidism, DM2 who presents with syncope and AMS.  Patient reports that she went to pharmacy to collect her medications and was getting back from pharmacy when she had an episode of loss of consciousness.  She has no recollection of the event.  Denies any triggers or any prodrome.  She woke up in the ambulance and was encephalopathy has resolved. She was not with family when this happened.  No reported history of any seizure activity by bystanders per EMS notes. She denies any prior history of seizures, no prior history of ICH, no prior history of CNS surgeries or CNS infections.  No history of seizures in her childhood.  She endorses she has been eating and drinking a lot less with last few days.  Denies any history of EtOH use.  Denies any fever, chills, no symptoms of an upper respiratory infection of the last few days, no symptoms of a urinary tract infection over the last few days, no nausea vomiting or diarrhea over the last few days.  She endorses that her glucose levels are not as well-controlled as they should be.  Endorses that she recently had an episode of hypoglycemia with a glucose going up to 410 in the setting of eating to candies.  She felt that her speech was slurring and that lasted for a couple hours and then resolved.  The lowest her glucose has been is 100.  She endorses she is has a history of diabetes since 1998.  Endorses that she does get tingling in her toes and the bottom of her feet from time to time. Endorses hx of chemo for remote cancer with no residual neuropathy.   ROS   Constitutional Denies weight loss, fever and chills.   HEENT  Denies changes in vision and hearing.   Respiratory Denies SOB and cough.   CV Denies palpitations and CP   GI Denies abdominal pain, nausea, vomiting and diarrhea.   GU Denies dysuria and urinary frequency.   MSK Denies myalgia and joint pain.   Skin Denies rash and pruritus.   Neurological Denies headache and syncope.   Psychiatric Denies recent changes in mood. Denies anxiety and depression.    Past History   Past Medical History:  Diagnosis Date  . Allergy   . Anemia   . Anginal pain ([place])   . Anxiety   . Arthritis   . Blood transfusion without reported diagnosis   . Breast cancer ([place])   . Carotid artery plaque    bilat  . Cataract   . Colon polyps    adenomatous  . Concussion 02/2016   Driving restrictions for 6w  . Coronary heart disease   . Depression   . Diverticulosis   . Dizziness   . Esophageal reflux   . Esophageal stricture   . Fibromyalgia   . Gallstones   . Hiatal hernia   . Hypercholesterolemia   . Hypertension   . Hypothyroidism   . IBS (irritable bowel syndrome)   . Kidney stones   . Osteoporosis   . Pneumonia  recently, just finished ABO  . Pneumonia   . PONV (postoperative nausea and vomiting)   . Sleep apnea    CPAP  . Thyroid disease 2002  . Type II or unspecified type diabetes mellitus without mention of complication, not stated as uncontrolled   . Unspecified gastritis and gastroduodenitis without mention of hemorrhage    Past Surgical History:  Procedure Laterality Date  . ANAL RECTAL MANOMETRY N/A 08/05/2015   Procedure: ANO RECTAL MANOMETRY;  Surgeon: Mauri Pole, MD;  Location: [facility];  Service: Endoscopy;  Laterality: N/A;  . APPENDECTOMY    . BIOPSY  08/06/2020   Procedure: BIOPSY;  Surgeon: Eloise Harman, DO;  Location: [facility];  Service: Endoscopy;;  . BREAST LUMPECTOMY     left  . CARDIAC CATHETERIZATION    . CATARACT EXTRACTION W/PHACO Left 03/30/2013   Procedure: CATARACT EXTRACTION PHACO AND  INTRAOCULAR LENS PLACEMENT (IOC);  Surgeon: Tonny Branch, MD;  Location: [facility];  Service: Ophthalmology;  Laterality: Left;  CDE:19.28  . CATARACT EXTRACTION W/PHACO Right 04/09/2013   Procedure: CATARACT EXTRACTION PHACO AND INTRAOCULAR LENS PLACEMENT (IOC);  Surgeon: Tonny Branch, MD;  Location: [facility];  Service: Ophthalmology;  Laterality: Right;  CDE: 15.44  . CERVICAL FUSION    . CESAREAN SECTION    . CHOLECYSTECTOMY    . CYSTOCELE REPAIR    . DILATION AND CURETTAGE OF UTERUS    . ESOPHAGOGASTRODUODENOSCOPY (EGD) WITH PROPOFOL N/A 08/06/2020   Procedure: ESOPHAGOGASTRODUODENOSCOPY (EGD) WITH PROPOFOL;  Surgeon: Eloise Harman, DO;  Location: [facility];  Service: Endoscopy;  Laterality: N/A;  . EYE SURGERY Left 03-30-13   Cataract  . EYE SURGERY Right 04-09-13   Cataract  . RECTOCELE REPAIR    . THYROIDECTOMY    . TONSILLECTOMY    . TOTAL ABDOMINAL HYSTERECTOMY     Family History  Problem Relation Age of Onset  . Sarcoidosis Mother   . Diabetes Mother   . Cancer Mother        sarcoma -  Right arm amputation  . Hypertension Mother   . Lung cancer Father   . Diabetes Father   . Heart disease Father        Before age 92  . Urolithiasis Father   . Cancer Father        Lung  . Hyperlipidemia Father   . Hypertension Father   . Esophageal cancer Maternal Uncle   . Ovarian cancer Maternal Aunt   . Colon cancer Maternal Uncle   . Lung cancer Maternal Uncle   . Diabetes Maternal Uncle   . Diabetes Maternal Aunt   . Colon polyps Maternal Uncle   . Heart disease Paternal Grandmother    Social History   Socioeconomic History  . Marital status: Married    Spouse name: Not on file  . Number of children: 4  . Years of education: Not on file  . Highest education level: Not on file  Occupational History  . Occupation: Retired Optician, dispensing: RETIRED  Tobacco Use  . Smoking status: Never Smoker  . Smokeless tobacco: Never Used  Vaping Use  . Vaping Use: Never used   Substance and Sexual Activity  . Alcohol use: No  . Drug use: No  . Sexual activity: Not on file  Other Topics Concern  . Not on file  Social History Narrative  . Not on file   Social Determinants of Health   Financial Resource Strain: Not on file  Food Insecurity: Not on file  Transportation Needs: Not on file  Physical Activity: Not on file  Stress: Not on file  Social Connections: Not on file   Allergies  Allergen Reactions  . Codeine Hives  . Sulfonamide Derivatives Hives    Medications  (Not in a hospital admission)    Vitals   Vitals:   09/27/20 2019 09/27/20 2115 09/27/20 2200 09/27/20 2215  BP:  133/63 130/60 127/60  Pulse:  75 81 79  Resp:  18 16 16   Temp:      TempSrc:      SpO2: 100% 98% 96% 100%     There is no height or weight on file to calculate BMI.  Physical Exam   General: Laying comfortably in bed; in no acute distress.  HENT: Normal oropharynx and mucosa. Normal external appearance of ears and nose.  Neck: Supple, no pain or tenderness  CV: No JVD. No peripheral edema.  Pulmonary: Symmetric Chest rise. Normal respiratory effort.  Abdomen: Soft to touch, non-tender.  Ext: No cyanosis, edema, or deformity  Skin: No rash. Normal palpation of skin.   Musculoskeletal: Normal digits and nails by inspection. No clubbing.   Neurologic Examination  Mental status/Cognition: Alert, oriented to self, place, month and year, good attention. Speech/language: Fluent, comprehension intact, object naming intact, repetition intact. Cranial nerves:   CN II Pupils equal and reactive to light, no VF deficits   CN III,IV,VI EOM intact, no gaze preference or deviation, no nystagmus   CN V normal sensation in V1, V2, and V3 segments bilaterally   CN VII no asymmetry, no nasolabial fold flattening   CN VIII normal hearing to speech   CN IX & X normal palatal elevation, no uvular deviation   CN XI 5/5 head turn and 5/5 shoulder shrug bilaterally   CN XII  midline tongue protrusion   Motor:  Muscle bulk: normal, tone normal, pronator drift none tremor none Mvmt Root Nerve  Muscle Right Left Comments  SA C5/6 Ax Deltoid 5 5   EF C5/6 Mc Biceps 5 5   EE C6/7/8 Rad Triceps 4+ 5   WF C6/7 Med FCR 5 5   WE C7/8 PIN ECU 5 5   F Ab C8/T1 U ADM/FDI 5 5   HF L1/2/3 Fem Illopsoas 5 4+ Pain due to L hip replacement limits her from full exertion.  KE L2/3/4 Fem Quad 5 5   DF L4/5 D Peron Tib Ant 5 5   PF S1/2 Tibial Grc/Sol 5 5    Reflexes:  Right Left Comments  Pectoralis      Biceps (C5/6) 2 2   Brachioradialis (C5/6) 2 2    Triceps (C6/7) 2 2    Patellar (L3/4) 2+ 2+    Achilles (S1) 2 2    Hoffman      Plantar     Jaw jerk    Sensation:  Light touch Intact throughout   Pin prick Intact throughout   Temperature Intact throughout   Vibration Decreased in BL toes, left worse than right.  Proprioception    Coordination/Complex Motor:  - Finger to Nose intact BL - Heel to shin with midl ataxia in LLE. - Rapid alternating movement are normal - Gait: Deferred.  Labs   CBC:  Recent Labs  Lab 09/27/20 2108 09/27/20 2135 09/27/20 2137  WBC 13.2*  --   --   NEUTROABS 9.0*  --   --   HGB 9.3* 10.9* 10.5*  HCT 33.2*  32.0* 31.0*  MCV 76.0*  --   --   PLT PLATELET CLUMPS NOTED ON SMEAR, UNABLE TO ESTIMATE  --   --     Basic Metabolic Panel:  Lab Results  Component Value Date   NA 134 (L) 09/27/2020   K 4.2 09/27/2020   CO2 22 09/27/2020   GLUCOSE 144 (H) 09/27/2020   BUN 21 09/27/2020   CREATININE 1.20 (H) 09/27/2020   CALCIUM 9.2 09/27/2020   GFRNONAA 44 (L) 09/27/2020   GFRAA 72 07/14/2020   Lipid Panel:  Lab Results  Component Value Date   LDLCALC 41 12/28/2008   HgbA1c:  Lab Results  Component Value Date   HGBA1C 7.4 (H) 08/05/2020   Urine Drug Screen: No results found for: LABOPIA, [place], LABBENZ, AMPHETMU, THCU, LABBARB  Alcohol Level     Component Value Date/Time   ETH <10 09/27/2020 2113     CT Head without contrast: CTH was negative for a large hypodensity concerning for a large territory infarct or hyperdensity concerning for an ICH  CT angio Head and Neck with contrast: No LVO.  MRI Brain pending.  Impression   Jeanne Kim is a 79 y.o. female with PMH significant for anemia, GERD, CAD, HTN, HLD, hypothyroidism, DM2 who presents with syncope and AMS. Neuro exam with no focal deficit but notable for mild decrease in vibration in BL toes with mild subjective tingling in the bottom of her left foot.  The description of the event today seems less concerning for a seizure clinically and she has no obvious seizure risk factor.  As for the reported focal sensory deficit that was noted by ED team. On my detailed evaluation of her sensation, patient did not appreciate any sensory deficit to touch, pinprick, she does have mild decrease to vibration and she does report some tingling in the bottom of her feet that I think is either from potential prior hx of B12 deficiency or from her long standing diabetes since 1998.  As for her episode of slurred speech lasting a couple hours in the setting of hyperglycemia at home, she has had sl;urred speech before with fluctuation with her glucose. However, that is somewhat atypical and it is reasonable to get MRI Brain given stroke risk factors including DM2, HTN, HLD.  Recommendations  - Agree with MRI Brain without contrast - I ordered workup for hormonal causes of syncope with TSH, AM Cortisol - I ordered orthostatic vitals x 1. - I ordered neuropathy labs with VitB6, VitB12, Folate, TSH, SPEP. ______________________________________________________________________   Thank you for the opportunity to take part in the care of this patient. If you have any further questions, please contact the neurology consultation attending.  Signed,  [place] Pager Number [phone] _ _ _   _ __   _ __ _ _  __ __    _ __   __ _

## 2020-09-28 NOTE — Progress Notes (Signed)
  Echocardiogram 2D Echocardiogram with definity has been performed.  Darlina Sicilian M 09/28/2020, 10:39 AM

## 2020-09-28 NOTE — Progress Notes (Signed)
Jeanne Kim, is a 79 y.o. female, DOB - 1941-01-14, [id number]  Patient admitted earlier today for a syncopal event with fall and head injury causing posttraumatic small subdural hematoma, CT and MRI also question possible NPH.  Case discussed with neurologist Dr. Charlsie Merles.  EEG today, PT OT evaluation, CT scan 9 PM 24 hours after the initial CT to document stability of the bleed per neurosurgery.  If all well can likely discharge on 09/29/2020.    Message left for daughter [phone] on 09/28/2020 at 11:23 AM    Vitals:   09/28/20 0415 09/28/20 0604 09/28/20 0730 09/28/20 0935  BP: 137/66 106/83 134/72 133/72  Pulse: 90 89 93 87  Resp: 20 14 18 16   Temp:   99.1 F (37.3 C)   TempSrc:      SpO2: 93% 96% 96% 98%  Weight:   78 kg   Height:   5\' 4"  (1.626 m)         Data Review   Micro Results Recent Results (from the past 240 hour(s))  Resp Panel by RT-PCR (Flu A&B, Covid) Nasopharyngeal Swab     Status: None   Collection Time: 09/28/20  1:03 AM   Specimen: Nasopharyngeal Swab; Nasopharyngeal(NP) swabs in vial transport medium  Result Value Ref Range Status   SARS Coronavirus 2 by RT PCR NEGATIVE NEGATIVE Final    Comment: (NOTE) SARS-CoV-2 target nucleic acids are NOT DETECTED.  The SARS-CoV-2 RNA is generally detectable in upper respiratory specimens during the acute phase of infection. The lowest concentration of SARS-CoV-2 viral copies this assay can detect is 138 copies/mL. A negative result does not preclude SARS-Cov-2 infection and should not be used as the sole basis for treatment or other patient management decisions. A negative result may occur with  improper specimen collection/handling, submission of specimen other than nasopharyngeal swab, presence of viral mutation(s) within the areas targeted by this assay, and inadequate number of viral copies(<138 copies/mL). A negative  result must be combined with clinical observations, patient history, and epidemiological information. The expected result is Negative.  Fact Sheet for Patients:  EntrepreneurPulse.com.au  Fact Sheet for Healthcare Providers:  IncredibleEmployment.be  This test is no t yet approved or cleared by the [place] FDA and  has been authorized for detection and/or diagnosis of SARS-CoV-2 by FDA under an Emergency Use Authorization (EUA). This EUA will remain  in effect (meaning this test can be used) for the duration of the COVID-19 declaration under Section 564(b)(1) of the Act, 21 U.S.C.section 360bbb-3(b)(1), unless the authorization is terminated  or revoked sooner.       Influenza A by PCR NEGATIVE NEGATIVE Final   Influenza B by PCR NEGATIVE NEGATIVE Final    Comment: (NOTE) The Xpert Xpress SARS-CoV-2/FLU/RSV plus assay is intended as an aid in the diagnosis of influenza from Nasopharyngeal swab specimens and should not be used as a sole basis for treatment. Nasal washings and aspirates are unacceptable for Xpert Xpress SARS-CoV-2/FLU/RSV testing.  Fact Sheet for Patients: EntrepreneurPulse.com.au  Fact Sheet for Healthcare Providers: IncredibleEmployment.be  This test is not yet approved or cleared by the [place] FDA and has been authorized for detection and/or diagnosis of SARS-CoV-2 by FDA under an Emergency Use Authorization (EUA). This EUA will remain in effect (meaning this test can be used) for the duration of the COVID-19 declaration under Section 564(b)(1) of the Act, 21 U.S.C. section 360bbb-3(b)(1), unless the authorization is terminated or revoked.  Performed at [facility], [address] [place],  [address]     Radiology Reports CT Angio Head W or Wo Contrast  Addendum Date: 09/28/2020   ADDENDUM REPORT: 09/28/2020 04:51 ADDENDUM: At time of the initial  dictation, the noncontrast head portion of this exam was not available for review due to technical air. Images are now available for review, with the following findings. Age-related cerebral atrophy with mild chronic small vessel ischemic disease. Tiny subdural hemorrhage overlies the left parietal convexity, measuring no more than 2 mm in maximal thickness. Additional scant 1-2 mm extra-axial hemorrhage overlies the right frontal convexity without mass effect. Scattered small volume subarachnoid blood seen overlying the anterior frontal convexities bilaterally. Patchy small volume hemorrhage present within the right sylvian fissure. Few small foci of likely subarachnoid blood overlie the peripheral right cerebellum (series 1, image 10). No associated mass effect. No acute large vessel territory infarct. No mass lesion or midline shift. Mild ventricular prominence somewhat out of proportion to cortical sulcation. No hyperdense vessel. Scattered vascular calcifications noted within the carotid siphons. Small right parietal scalp laceration/contusion. No calvarial fracture. Globes and orbital soft tissues demonstrate no acute finding. Paranasal sinuses are largely clear. No significant mastoid effusion. IMPRESSION: 1. Small 2 mm subdural hemorrhage overlying the left parietal convexity, with additional 1-2 mm extra-axial collection overlying the anterior right frontal convexity. No associated mass effect. 2. Patchy small volume subarachnoid hemorrhage involving the anterior frontal lobes bilaterally, right sylvian fissure, and peripheral right cerebellum. 3. Small right parietal scalp contusion/laceration. No calvarial fracture. 4. Mild ventricular prominence somewhat out of proportion to cortical sulcation. While this finding may be related to underlying atrophy, a degree of NPH could be contributory. At time of this dictation, these findings have already been communicated to Dr. Marlowe Sax of the hospitalist service,  communicated at 4:05 a.m. on 09/28/2020. Electronically Signed   By: Jeannine Boga M.D.   On: 09/28/2020 04:51   Result Date: 09/28/2020 CLINICAL DATA:  Initial evaluation for neuro deficit, stroke suspected. EXAM: CT ANGIOGRAPHY HEAD AND NECK TECHNIQUE: Multidetector CT imaging of the head and neck was performed using the standard protocol during bolus administration of intravenous contrast. Multiplanar CT image reconstructions and MIPs were obtained to evaluate the vascular anatomy. Carotid stenosis measurements (when applicable) are obtained utilizing NASCET criteria, using the distal internal carotid diameter as the denominator. CONTRAST:  80mL OMNIPAQUE IOHEXOL 350 MG/ML SOLN COMPARISON:  Prior MRA from 02/21/2012. FINDINGS: CTA NECK FINDINGS Aortic arch: Visualized aortic arch of normal caliber with normal branch pattern. Mild atheromatous change about the aortic arch and origin of the great vessels. No hemodynamically significant stenosis about the origin of the great vessels. Right carotid system: Right CCA tortuous proximally but widely patent to the bifurcation. Minimal atheromatous plaque about the right bifurcation without stenosis. Right ICA widely patent distally to the skull base without stenosis, dissection or occlusion. Left carotid system: Left CCA tortuous proximally but is widely patent to the bifurcation without stenosis. Minimal atheromatous plaque about the left bifurcation without stenosis. Left ICA mildly tortuous but widely patent to the skull base without stenosis, dissection or occlusion. Vertebral arteries: Both vertebral arteries arise from subclavian arteries. No proximal subclavian artery stenosis. Vertebral arteries tortuous proximally but are widely patent without stenosis, dissection or occlusion. Skeleton: No visible acute osseous abnormality. No discrete or worrisome osseous lesions. Patient is status post prior fusion at C4 through C7. Other neck: No other acute soft  tissue abnormality within the neck. No mass or adenopathy. 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 Upper chest: Mild scattered ground-glass opacity with interlobular septal thickening within the visualized lungs suggest a degree of pulmonary interstitial edema. Visualized upper chest demonstrates no other acute finding. Review of the MIP images confirms the above findings CTA HEAD FINDINGS Anterior circulation: Petrous segments widely patent bilaterally. Scattered atheromatous change within the carotid siphons without significant stenosis. A1 segments widely patent. Normal anterior communicating artery complex. Anterior cerebral arteries widely patent to their distal aspects. No M1 stenosis or occlusion. Normal MCA bifurcations. Distal MCA branches well perfused and symmetric. Posterior circulation: Both V4 segments widely patent to the vertebrobasilar junction. Both PICA origins patent and normal. Basilar widely patent to its distal aspect. Superior cerebral arteries patent bilaterally. Both PCAs primarily supplied via the basilar well perfused to their distal aspects. Venous sinuses: Grossly patent allowing for timing the contrast bolus. Anatomic variants: None significant.  No intracranial aneurysm. Review of the MIP images confirms the above findings IMPRESSION: 1. Negative CTA for large vessel occlusion. 2. Mild for age atheromatous change about the major arterial vasculature of the head and neck as above. No hemodynamically significant or correctable stenosis. 3. Diffuse tortuosity of the major arterial vasculature of the neck, suggesting chronic underlying hypertension. 4. Scattered ground-glass opacity with interlobular septal thickening within the visualized lungs, suggesting a degree of pulmonary interstitial edema. Correlation with plain film radiography suggested. Electronically Signed: By: Jeannine Boga M.D. On: 09/27/2020 23:35   CT Angio Neck W and/or Wo Contrast  Addendum Date: 09/28/2020    ADDENDUM REPORT: 09/28/2020 04:51 ADDENDUM: At time of the initial dictation, the noncontrast head portion of this exam was not available for review due to technical air. Images are now available for review, with the following findings. Age-related cerebral atrophy with mild chronic small vessel ischemic disease. Tiny subdural hemorrhage overlies the left parietal convexity, measuring no more than 2 mm in maximal thickness. Additional scant 1-2 mm extra-axial hemorrhage overlies the right frontal convexity without mass effect. Scattered small volume subarachnoid blood seen overlying the anterior frontal convexities bilaterally. Patchy small volume hemorrhage present within the right sylvian fissure. Few small foci of likely subarachnoid blood overlie the peripheral right cerebellum (series 1, image 10). No associated mass effect. No acute large vessel territory infarct. No mass lesion or midline shift. Mild ventricular prominence somewhat out of proportion to cortical sulcation. No hyperdense vessel. Scattered vascular calcifications noted within the carotid siphons. Small right parietal scalp laceration/contusion. No calvarial fracture. Globes and orbital soft tissues demonstrate no acute finding. Paranasal sinuses are largely clear. No significant mastoid effusion. IMPRESSION: 1. Small 2 mm subdural hemorrhage overlying the left parietal convexity, with additional 1-2 mm extra-axial collection overlying the anterior right frontal convexity. No associated mass effect. 2. Patchy small volume subarachnoid hemorrhage involving the anterior frontal lobes bilaterally, right sylvian fissure, and peripheral right cerebellum. 3. Small right parietal scalp contusion/laceration. No calvarial fracture. 4. Mild ventricular prominence somewhat out of proportion to cortical sulcation. While this finding may be related to underlying atrophy, a degree of NPH could be contributory. At time of this dictation, these findings have  already been communicated to Dr. Marlowe Sax of the hospitalist service, communicated at 4:05 a.m. on 09/28/2020. Electronically Signed   By: Jeannine Boga M.D.   On: 09/28/2020 04:51   Result Date: 09/28/2020 CLINICAL DATA:  Initial evaluation for neuro deficit, stroke suspected. EXAM: CT ANGIOGRAPHY HEAD AND NECK TECHNIQUE: Multidetector CT imaging of the head and neck was performed using the standard protocol during bolus administration  of intravenous contrast. Multiplanar CT image reconstructions and MIPs were obtained to evaluate the vascular anatomy. Carotid stenosis measurements (when applicable) are obtained utilizing NASCET criteria, using the distal internal carotid diameter as the denominator. CONTRAST:  36mL OMNIPAQUE IOHEXOL 350 MG/ML SOLN COMPARISON:  Prior MRA from 02/21/2012. FINDINGS: CTA NECK FINDINGS Aortic arch: Visualized aortic arch of normal caliber with normal branch pattern. Mild atheromatous change about the aortic arch and origin of the great vessels. No hemodynamically significant stenosis about the origin of the great vessels. Right carotid system: Right CCA tortuous proximally but widely patent to the bifurcation. Minimal atheromatous plaque about the right bifurcation without stenosis. Right ICA widely patent distally to the skull base without stenosis, dissection or occlusion. Left carotid system: Left CCA tortuous proximally but is widely patent to the bifurcation without stenosis. Minimal atheromatous plaque about the left bifurcation without stenosis. Left ICA mildly tortuous but widely patent to the skull base without stenosis, dissection or occlusion. Vertebral arteries: Both vertebral arteries arise from subclavian arteries. No proximal subclavian artery stenosis. Vertebral arteries tortuous proximally but are widely patent without stenosis, dissection or occlusion. Skeleton: No visible acute osseous abnormality. No discrete or worrisome osseous lesions. Patient is status  post prior fusion at C4 through C7. Other neck: No other acute soft tissue abnormality within the neck. No mass or adenopathy. 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 Upper chest: Mild scattered ground-glass opacity with interlobular septal thickening within the visualized lungs suggest a degree of pulmonary interstitial edema. Visualized upper chest demonstrates no other acute finding. Review of the MIP images confirms the above findings CTA HEAD FINDINGS Anterior circulation: Petrous segments widely patent bilaterally. Scattered atheromatous change within the carotid siphons without significant stenosis. A1 segments widely patent. Normal anterior communicating artery complex. Anterior cerebral arteries widely patent to their distal aspects. No M1 stenosis or occlusion. Normal MCA bifurcations. Distal MCA branches well perfused and symmetric. Posterior circulation: Both V4 segments widely patent to the vertebrobasilar junction. Both PICA origins patent and normal. Basilar widely patent to its distal aspect. Superior cerebral arteries patent bilaterally. Both PCAs primarily supplied via the basilar well perfused to their distal aspects. Venous sinuses: Grossly patent allowing for timing the contrast bolus. Anatomic variants: None significant.  No intracranial aneurysm. Review of the MIP images confirms the above findings IMPRESSION: 1. Negative CTA for large vessel occlusion. 2. Mild for age atheromatous change about the major arterial vasculature of the head and neck as above. No hemodynamically significant or correctable stenosis. 3. Diffuse tortuosity of the major arterial vasculature of the neck, suggesting chronic underlying hypertension. 4. Scattered ground-glass opacity with interlobular septal thickening within the visualized lungs, suggesting a degree of pulmonary interstitial edema. Correlation with plain film radiography suggested. Electronically Signed: By: Jeannine Boga M.D. On: 09/27/2020 23:35   MR  BRAIN WO CONTRAST  Result Date: 09/28/2020 CLINICAL DATA:  Initial evaluation for acute syncope, fall, neuro deficit. EXAM: MRI HEAD WITHOUT CONTRAST TECHNIQUE: Multiplanar, multiecho pulse sequences of the brain and surrounding structures were obtained without intravenous contrast. COMPARISON:  Prior CTA from 09/27/2020. Additionally, comparison made with a prior noncontrast head CT from 09/27/2020, visible in PACS, but not completed at time of dictation of prior CTA or of this MRI, and not yet dictated. FINDINGS: Brain: Generalized age-related cerebral atrophy. Minimal chronic microvascular ischemic disease for age. No abnormal foci of restricted diffusion to suggest acute or subacute ischemia. Gray-white matter differentiation maintained. No encephalomalacia to suggest chronic cortical infarction. Thinning 2 mm subdural hematoma  seen overlying the left parietal convexity (series 11, image 16). This is faintly visible on prior CT. There is an additional faint extra-axial collection overlying the right frontal convexity (series 11, image 15), extremely difficult to visualized on prior CT. This measures 1-2 mm in maximal thickness. Scattered small volume subarachnoid blood seen along both frontal convexities in within the right sylvian fissure. Patchy small volume hemorrhage seen at the peripheral right cerebellum (series 14, image 15). Findings are also visible on prior CT, and overall not significantly changed or worsened. No mass lesion, midline shift, or mass effect. Diffuse ventricular prominence somewhat out of proportion to cortical sulcation, which could reflect a degree of NPH. No frank hydrocephalus or transependymal flow of CSF. Empty sella noted.  Midline structures intact. Vascular: Major intracranial vascular flow voids are maintained. Skull and upper cervical spine: Craniocervical junction within normal limits. Bone marrow signal intensity normal. Right parietal scalp laceration with skin staples  in place. Sinuses/Orbits: Globes and orbital soft tissues demonstrate no acute finding. Patient status post bilateral ocular lens replacement. Mild scattered mucosal thickening noted with throughout the paranasal sinuses. Small left mastoid effusion noted, of doubtful significance. Inner ear structures grossly normal. Other: None. IMPRESSION: 1. Thin 2 mm subdural hematoma overlying the left parietal convexity, with additional 1-2 mm extra-axial collection overlying the right frontal convexity. No significant mass effect. 2. Scattered small volume posttraumatic subarachnoid hemorrhage overlying the bilateral frontal convexities, right sylvian fissure, and right cerebellum. 3. Diffuse ventricular prominence, somewhat out of proportion to cortical sulcation. While this finding may be related to underlying atrophy, a degree of NPH could be contributory. No transependymal flow of CSF. 4. No acute intracranial infarct or other abnormality. Critical Value/emergent results were called by telephone at the time of interpretation on 09/28/2020 at 4:05 am to provider Dr. Rachell Cipro , who verbally acknowledged these results. Electronically Signed   By: Jeannine Boga M.D.   On: 09/28/2020 04:13   CT C-SPINE NO CHARGE  Result Date: 09/28/2020 CLINICAL DATA:  Initial evaluation for acute trauma, fall. EXAM: CT CERVICAL SPINE WITHOUT CONTRAST TECHNIQUE: Multidetector CT imaging of the cervical spine was performed without intravenous contrast. Multiplanar CT image reconstructions were also generated. COMPARISON:  Prior CTA from 09/27/2020. FINDINGS: Alignment: Straightening of the normal cervical lordosis. Trace 2 mm anterolisthesis of C7 on T1, T1 on T2, and T2 on T3, chronic and facet mediated. Skull base and vertebrae: Skull base intact. Normal C1-2 articulations are preserved in the dens is intact. Vertebral body height maintained. No acute or chronic fracture. No discrete osseous lesions. Soft tissues and  spinal canal: Paraspinous soft tissues demonstrate no acute abnormality. No abnormal prevertebral edema. Vascular structures described on prior CTA. Disc levels: Prior fusion at C4 through C7. Solid arthrodesis at C5 through C7, with pseudoarthrosis at C4-5. The right screw at the level of C4 is fractured (series 6, image 28). Minimal periprosthetic lucency about the contralateral left screw of C4 (series 3, image 51). Hardware otherwise intact without evidence for failure or other complication central disc protrusion at C2-3 with resultant mild spinal stenosis (series 4, image 37). Upper chest: Findings suggestive of mild pulmonary interstitial edema again noted within the visualized lungs. Other: None. IMPRESSION: 1. No acute traumatic injury within the cervical spine. 2. Prior fusion at C4 through C7 with solid arthrodesis at C5 through C7, with pseudoarthrosis at C4-5. The right screw at the level of C4 is fractured. Minimal periprosthetic lucency about the contralateral left screw of C4 suggesting  loosening. No other hardware complication. 3. Central disc protrusion at C2-3 with resultant mild spinal stenosis. 4. Findings suggestive of mild pulmonary interstitial edema within the visualized lungs. Electronically Signed   By: Jeannine Boga M.D.   On: 09/28/2020 03:11   DG Chest Portable 1 View  Result Date: 09/27/2020 CLINICAL DATA:  79 year old female with altered mental status. EXAM: PORTABLE CHEST 1 VIEW COMPARISON:  Chest radiograph dated 08/05/2020. FINDINGS: Minimal bibasilar atelectasis. No focal consolidation, pleural effusion, pneumothorax. The cardiac silhouette is within limits. No acute osseous pathology. Cervical spine ACDF. IMPRESSION: No active disease. Electronically Signed   By: Anner Crete M.D.   On: 09/27/2020 21:35   ECHOCARDIOGRAM COMPLETE  Result Date: 09/28/2020    ECHOCARDIOGRAM REPORT   Patient Name:   Jeanne Kim Date of Exam: 09/28/2020 Medical Rec #:   [id number]      Height:       64.0 in Accession #:    [id number]     Weight:       172.0 lb Date of Birth:  September 08, 1941      BSA:          1.835 m Patient Age:    33 years       BP:           133/72 mmHg Patient Gender: F              HR:           87 bpm. Exam Location:  Inpatient Procedure: 2D Echo, Intracardiac Opacification Agent, Color Doppler and Cardiac            Doppler Indications:    Syncope 780.2 / R55  History:        Patient has prior history of Echocardiogram examinations, most                 recent 08/21/2019. CAD; Risk Factors:Hypertension and                 Dyslipidemia. Past history of breast cancer. GERD, thyroid.  Sonographer:    Darlina Sicilian RDCS Referring Phys: [id number] [place]  Sonographer Comments: EF estimateion by ED attempted. IMPRESSIONS  1. Left ventricular ejection fraction, by estimation, is 60 to 65%. The left ventricle has normal function. The left ventricle has no regional wall motion abnormalities. Left ventricular diastolic parameters are consistent with Grade I diastolic dysfunction (impaired relaxation).  2. Right ventricular systolic function is normal. The right ventricular size is normal. There is normal pulmonary artery systolic pressure. The estimated right ventricular systolic pressure is 123456 mmHg.  3. The mitral valve is grossly normal. Trivial mitral valve regurgitation. No evidence of mitral stenosis.  4. The aortic valve is tricuspid. Aortic valve regurgitation is trivial. Mild aortic valve sclerosis is present, with no evidence of aortic valve stenosis.  5. The inferior vena cava is normal in size with greater than 50% respiratory variability, suggesting right atrial pressure of 3 mmHg. Comparison(s): No significant change from prior study. FINDINGS  Left Ventricle: Left ventricular ejection fraction, by estimation, is 60 to 65%. The left ventricle has normal function. The left ventricle has no regional wall motion abnormalities. Definity contrast agent  was given IV to delineate the left ventricular  endocardial borders. The left ventricular internal cavity size was normal in size. There is no left ventricular hypertrophy. Left ventricular diastolic parameters are consistent with Grade I diastolic dysfunction (impaired relaxation). Right Ventricle: The right ventricular size is normal. No increase  in right ventricular wall thickness. Right ventricular systolic function is normal. There is normal pulmonary artery systolic pressure. The tricuspid regurgitant velocity is 2.66 m/s, and  with an assumed right atrial pressure of 3 mmHg, the estimated right ventricular systolic pressure is 123456 mmHg. Left Atrium: Left atrial size was normal in size. Right Atrium: Right atrial size was normal in size. Pericardium: Trivial pericardial effusion is present. Presence of pericardial fat pad. Mitral Valve: The mitral valve is grossly normal. Mild mitral annular calcification. Trivial mitral valve regurgitation. No evidence of mitral valve stenosis. Tricuspid Valve: The tricuspid valve is grossly normal. Tricuspid valve regurgitation is trivial. No evidence of tricuspid stenosis. Aortic Valve: The aortic valve is tricuspid. Aortic valve regurgitation is trivial. Mild aortic valve sclerosis is present, with no evidence of aortic valve stenosis. Pulmonic Valve: The pulmonic valve was grossly normal. Pulmonic valve regurgitation is not visualized. No evidence of pulmonic stenosis. Aorta: The aortic root and ascending aorta are structurally normal, with no evidence of dilitation. Venous: The inferior vena cava is normal in size with greater than 50% respiratory variability, suggesting right atrial pressure of 3 mmHg. IAS/Shunts: The atrial septum is grossly normal.  LEFT VENTRICLE PLAX 2D LVIDd:         4.80 cm  Diastology LVIDs:         3.10 cm  LV e' medial:    4.68 cm/s LV PW:         1.00 cm  LV E/e' medial:  16.9 LV IVS:        0.90 cm  LV e' lateral:   5.22 cm/s LVOT diam:      1.80 cm  LV E/e' lateral: 15.1 LV SV:         48 LV SV Index:   26 LVOT Area:     2.54 cm                         3D Volume EF                         LV 3D EF:    52.50 %                         LV 3D EDV:   66800.00 mm                         LV 3D ESV:   31700.00 mm                         LV 3D SV:    35000.00 mm RIGHT VENTRICLE RV S prime:     11.20 cm/s TAPSE (M-mode): 2.1 cm LEFT ATRIUM             Index LA diam:        3.30 cm 1.80 cm/m LA Vol (A2C):   28.3 ml 15.42 ml/m LA Vol (A4C):   59.0 ml 32.15 ml/m LA Biplane Vol: 41.8 ml 22.78 ml/m  AORTIC VALVE LVOT Vmax:   116.00 cm/s LVOT Vmean:  74.200 cm/s LVOT VTI:    0.188 m  AORTA Ao Root diam: 3.30 cm Ao Asc diam:  3.30 cm MITRAL VALVE                TRICUSPID VALVE MV Area (PHT): 3.21 cm     TR  Peak grad:   28.3 mmHg MV Decel Time: 236 msec     TR Vmax:        266.00 cm/s MV E velocity: 79.00 cm/s MV A velocity: 121.00 cm/s  SHUNTS MV E/A ratio:  0.65         Systemic VTI:  0.19 m                             Systemic Diam: 1.80 cm Eleonore Chiquito MD Electronically signed by Eleonore Chiquito MD Signature Date/Time: 09/28/2020/11:16:01 AM    Final     CBC Recent Labs  Lab 09/27/20 2108 09/27/20 2135 09/27/20 2137 09/28/20 0730  WBC 13.2*  --   --  15.0*  HGB 9.3* 10.9* 10.5* 8.4*  HCT 33.2* 32.0* 31.0* 29.3*  PLT PLATELET CLUMPS NOTED ON SMEAR, UNABLE TO ESTIMATE  --   --  468*  MCV 76.0*  --   --  75.3*  MCH 21.3*  --   --  21.6*  MCHC 28.0*  --   --  28.7*  RDW 17.5*  --   --  17.4*  LYMPHSABS 2.3  --   --   --   MONOABS 1.4*  --   --   --   EOSABS 0.3  --   --   --   BASOSABS 0.1  --   --   --     Chemistries  Recent Labs  Lab 09/27/20 2108 09/27/20 2135 09/27/20 2137 09/28/20 0730  NA 130* 133* 134* 136  K 4.4 4.2 4.2 3.5  CL 95* 95*  --  98  CO2 22  --   --  28  GLUCOSE 149* 144*  --  92  BUN 19 21  --  14  CREATININE 1.26* 1.20*  --  1.00  CALCIUM 9.2  --   --  8.8*  MG  --   --   --  1.1*  AST 26  --   --    --   ALT 11  --   --   --   ALKPHOS 60  --   --   --   BILITOT 0.9  --   --   --    ------------------------------------------------------------------------------------------------------------------ estimated creatinine clearance is 47.6 mL/min (by C-G formula based on SCr of 1 mg/dL). ------------------------------------------------------------------------------------------------------------------ No results for input(s): HGBA1C in the last 72 hours. ------------------------------------------------------------------------------------------------------------------ No results for input(s): CHOL, HDL, LDLCALC, TRIG, CHOLHDL, LDLDIRECT in the last 72 hours. ------------------------------------------------------------------------------------------------------------------ Recent Labs    09/28/20 0424  TSH 0.231*   ------------------------------------------------------------------------------------------------------------------ Recent Labs    09/28/20 0424  VITAMINB12 218  FOLATE 19.8    Coagulation profile No results for input(s): INR, PROTIME in the last 168 hours.  Recent Labs    09/28/20 0838  DDIMER 2.18*    Cardiac Enzymes No results for input(s): CKMB, TROPONINI, MYOGLOBIN in the last 168 hours.  Invalid input(s): CK ------------------------------------------------------------------------------------------------------------------ Invalid input(s): POCBNP   Signature  Lala Lund M.D on 09/28/2020 at 11:23 AM   -  To page go to [email or web address]

## 2020-09-28 NOTE — ED Notes (Signed)
Dinner Tray Ordered @ 1705. 

## 2020-09-28 NOTE — ED Provider Notes (Signed)
[facility] EMERGENCY DEPARTMENT Provider Note   CSN: [id number] Arrival date & time: 09/27/20  1957     History Chief Complaint  Patient presents with  . Fall    [facility], [facility] on [facility] of [facility], LOC     Jeanne Kim is a 79 y.o. female.  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  Patient reports the last thing she remembers is waiting for the pharmacist to get more of [facility] the [facility].  She reports remembers this and then remembers waking up with EMS.  Bystanders report that they saw the patient had syncope and fall, hitting the back of the head and causing a laceration.  She is very sleepy but reports dull headache, pain to the back of the head near the laceration.  Initially reports last night had a terrible night, not sleeping, sugars were high and low and she had left sided numbness and or weakness. When asked again, however she said she wasn't sure. Family stated 1.5 weeks ago she had slurred speech and said her left side felt "different" --family was concerned but she did not want to be evaluated.  They report 2 nights ago they had a difficult night chasing her sugars, that they were up and down and they didn't sleep well.  Report slurred speech at that time but that she has had slurred speech with glucose fluctuations before.  On exam, patient does note left lower extremity numbness.  Denies weakness. Denies chest pain, dyspnea, abdominal pain, cough, fever, urinary symptoms, black of bloody stools. Reports has a past history of GI bleed and ulcers, however.  Denies change in medications.  Did not eat or drink much today.  Past Medical History:  Diagnosis Date  . Allergy   . Anemia   . Anginal pain ([place])   . Anxiety   . Arthritis   . Blood transfusion without  reported diagnosis   . Breast cancer ([place])   . Carotid artery plaque    bilat  . Cataract   . Colon polyps    adenomatous  . Concussion 02/2016   Driving restrictions for 6w  . Coronary heart disease   . Depression   . Diverticulosis   . Dizziness   . Esophageal reflux   . Esophageal stricture   . Fibromyalgia   . Gallstones   . Hiatal hernia   . Hypercholesterolemia   . Hypertension   . Hypothyroidism   . IBS (irritable bowel syndrome)   . Kidney stones   . Osteoporosis   . Pneumonia    recently, just finished ABO  . Pneumonia   . PONV (postoperative nausea and vomiting)   . Sleep apnea    CPAP  . Thyroid disease 2002  . Type II or unspecified type diabetes mellitus without mention of complication, not stated as uncontrolled   . Unspecified gastritis and gastroduodenitis without mention of hemorrhage     Patient Active Problem List   Diagnosis Date Noted  . Upper GI bleed 08/05/2020  . Fecal incontinence   . Swelling of limb-Bilateral leg 04/29/2014  . Weakness of both legs 04/29/2014  . Dizziness and giddiness 04/29/2014  . Malignant neoplasm of breast ([place]) 07/02/2013  . Occlusion and stenosis of carotid artery without mention of cerebral infarction 04/28/2013  . Carotid stenosis 01/20/2013  .  Autonomic orthostatic hypotension 10/17/2012  . Dyspnea 08/27/2012  . Syncope 02/17/2012  . Head trauma 02/14/2012  . Diaphoresis 02/05/2012  . Unspecified constipation 12/12/2011  . Personal history of colonic polyps 12/12/2011  . Family history of malignant neoplasm of gastrointestinal tract 12/12/2011  . Diverticulosis of colon 12/12/2011  . Gastritis 12/12/2011  . GERD with stricture 12/12/2011  . Esophageal reflux 11/27/2011  . Constipation 11/27/2011  . IBS (irritable bowel syndrome) 11/27/2011  . Bacterial overgrowth syndrome 11/27/2011  . DM (diabetes mellitus) ([place]) 11/27/2011  . S/P cholecystectomy 11/27/2011  . Adult hypothyroidism 07/13/2011  . Type  2 diabetes mellitus ([place]) 07/13/2011  . ADENOCARCINOMA, LEFT BREAST 12/09/2009  . CHEST PAIN-PRECORDIAL 12/09/2009  . UNSPECIFIED HYPOTHYROIDISM 04/27/2009  . DIABETES MELLITUS 04/07/2009  . HYPERCHOLESTEROLEMIA  IIA 04/07/2009  . OVERWEIGHT/OBESITY 04/07/2009  . Obstructive sleep apnea 04/07/2009  . Essential hypertension 04/07/2009  . Coronary atherosclerosis 04/07/2009  . IRRITABLE BOWEL SYNDROME 04/07/2009  . DEGENERATIVE JOINT DISEASE 04/07/2009  . DEGENERATIVE DISC DISEASE 04/07/2009  . FIBROMYALGIA 04/07/2009  . Thyroid nodule 05/04/2008    Past Surgical History:  Procedure Laterality Date  . ANAL RECTAL MANOMETRY N/A 08/05/2015   Procedure: ANO RECTAL MANOMETRY;  Surgeon: Mauri Pole, MD;  Location: [facility];  Service: Endoscopy;  Laterality: N/A;  . APPENDECTOMY    . BIOPSY  08/06/2020   Procedure: BIOPSY;  Surgeon: Eloise Harman, DO;  Location: [facility];  Service: Endoscopy;;  . BREAST LUMPECTOMY     left  . CARDIAC CATHETERIZATION    . CATARACT EXTRACTION W/PHACO Left 03/30/2013   Procedure: CATARACT EXTRACTION PHACO AND INTRAOCULAR LENS PLACEMENT (IOC);  Surgeon: Tonny Branch, MD;  Location: [facility];  Service: Ophthalmology;  Laterality: Left;  CDE:19.28  . CATARACT EXTRACTION W/PHACO Right 04/09/2013   Procedure: CATARACT EXTRACTION PHACO AND INTRAOCULAR LENS PLACEMENT (IOC);  Surgeon: Tonny Branch, MD;  Location: [facility];  Service: Ophthalmology;  Laterality: Right;  CDE: 15.44  . CERVICAL FUSION    . CESAREAN SECTION    . CHOLECYSTECTOMY    . CYSTOCELE REPAIR    . DILATION AND CURETTAGE OF UTERUS    . ESOPHAGOGASTRODUODENOSCOPY (EGD) WITH PROPOFOL N/A 08/06/2020   Procedure: ESOPHAGOGASTRODUODENOSCOPY (EGD) WITH PROPOFOL;  Surgeon: Eloise Harman, DO;  Location: [facility];  Service: Endoscopy;  Laterality: N/A;  . EYE SURGERY Left 03-30-13   Cataract  . EYE SURGERY Right 04-09-13   Cataract  . RECTOCELE REPAIR    . THYROIDECTOMY    .  TONSILLECTOMY    . TOTAL ABDOMINAL HYSTERECTOMY       OB History   No obstetric history on file.     Family History  Problem Relation Age of Onset  . Sarcoidosis Mother   . Diabetes Mother   . Cancer Mother        sarcoma -  Right arm amputation  . Hypertension Mother   . Lung cancer Father   . Diabetes Father   . Heart disease Father        Before age 93  . Urolithiasis Father   . Cancer Father        Lung  . Hyperlipidemia Father   . Hypertension Father   . Esophageal cancer Maternal Uncle   . Ovarian cancer Maternal Aunt   . Colon cancer Maternal Uncle   . Lung cancer Maternal Uncle   . Diabetes Maternal Uncle   . Diabetes Maternal Aunt   . Colon polyps Maternal Uncle   .  Heart disease Paternal Grandmother     Social History   Tobacco Use  . Smoking status: Never Smoker  . Smokeless tobacco: Never Used  Vaping Use  . Vaping Use: Never used  Substance Use Topics  . Alcohol use: No  . Drug use: No    Home Medications Prior to Admission medications   Medication Sig Start Date End Date Taking? Authorizing Provider  aspirin 81 MG chewable tablet Chew 81 mg by mouth daily.    [provider]  atorvastatin (LIPITOR) 20 MG tablet Take 1 tablet (20 mg total) by mouth daily. Patient not taking: Reported on 08/05/2020 10/17/12   Hillary Bow, MD  CALCIUM-MAGNESIUM-ZINC PO Take 3 tablets by mouth daily.    [provider]  Coenzyme Q10 (CO Q-10) 100 MG CAPS Take 100 mg by mouth daily.    [provider]  cyanocobalamin (,VITAMIN B-12,) 1000 MCG/ML injection INJECT 1 ML INTO MUSCLE ONCE A MONTH 04/27/19   Mauri Pole, MD  doxepin (SINEQUAN) 10 MG capsule Take by mouth. 02/16/19   [provider]  DULoxetine (CYMBALTA) 60 MG capsule Take 60 mg by mouth daily.    [provider]  EPINEPHrine 0.3 mg/0.3 mL IJ SOAJ injection Frequency:PHARMDIR   Dosage:0.0     Instructions:  Note:self inject in the event of allergic  reaction, then proceed immediately to the nearest Emergency Room. Dose: 1 INJECTION 07/18/09   [provider]  fentaNYL (DURAGESIC - DOSED MCG/HR) 25 MCG/HR patch Place 25 mcg onto the skin every 3 (three) days.    [provider]  fexofenadine (ALLEGRA) 30 MG tablet Take 30 mg by mouth daily.    [provider]  fluconazole (DIFLUCAN) 150 MG tablet Take 150 mg by mouth once. 08/03/20   [provider]  fluticasone (FLONASE) 50 MCG/ACT nasal spray Place 2 sprays into the nose daily.    [provider]  furosemide (LASIX) 40 MG tablet Take 1 tablet (40 mg total) by mouth 3 (three) times a week. Take on Monday/Wednesday/Friday 07/06/20   Richardson Dopp T, PA-C  gabapentin (NEURONTIN) 300 MG capsule Take 300 mg by mouth at bedtime.    [provider]  gentamicin (GARAMYCIN) 0.3 % ophthalmic solution 1 drop 3 (three) times daily. 06/16/20   [provider]  glyBURIDE (DIABETA) 5 MG tablet Take 5 mg by mouth daily. 02/12/20   [provider]  Insulin Glargine-Lixisenatide (SOLIQUA) 100-33 UNT-MCG/ML SOPN Inject 20 Units into the skin. Pt adjust between 20-25 units    [provider]  lidocaine (LIDODERM) 5 % Place 1 patch onto the skin daily. Remove & Discard patch within 12 hours or as directed by MD    [provider]  LINZESS 290 MCG CAPS capsule TAKE 1 CAPSULE BY MOUTH EVERY DAY 04/12/20   Mauri Pole, MD  Melatonin 10 MG TABS Take by mouth.    [provider]  metFORMIN (GLUCOPHAGE) 1000 MG tablet Take 1,000 mg by mouth 2 (two) times daily with a meal.    [provider]  metolazone (ZAROXOLYN) 5 MG tablet Take 5 mg by mouth daily.    [provider]  metoprolol succinate (TOPROL-XL) 50 MG 24 hr tablet TAKE 1 TABLET BY MOUTH EVERY DAY 07/08/20   Sherren Mocha, MD  midodrine (PROAMATINE) 10 MG tablet  07/11/20   [provider]  montelukast (SINGULAIR) 10 MG tablet Take 10 mg  by mouth at bedtime.    [provider]  Needles & Syringes MISC 25G 5/8 inch needle on 3 ml syringe-Use once monthly for B12 injections 07/20/15   Nandigam, Karleen Hampshire V, MD  nitroGLYCERIN (NITROSTAT) 0.4 MG SL tablet PLACE 1 TABLET (0.4 MG TOTAL) UNDER THE TONGUE EVERY 5 (FIVE) MINUTES AS NEEDED FOR CHEST PAIN. 12/03/19   Sherren Mocha, MD  Omega-3 Fatty Acids (FISH OIL PO) Take by mouth. 07/18/09   [provider]  [facility] VERIO test strip 1 each by Other route as directed.  04/18/16   [provider]  pantoprazole (PROTONIX) 40 MG tablet Take 1 tablet (40 mg total) by mouth 2 (two) times daily. 08/07/20   Mariel Aloe, MD  potassium chloride (KLOR-CON) 10 MEQ tablet Take 2 tablets (20 mEq total) by mouth 3 (three) times a week. 07/06/20   Richardson Dopp T, PA-C  ramipril (ALTACE) 5 MG capsule Take 1 capsule (5 mg total) by mouth at bedtime. 01/11/16   Sherren Mocha, MD  senna-docusate (SENOKOT-S) 8.6-50 MG tablet Take by mouth. 03/25/20   [provider]  SYNTHROID 137 MCG tablet Take 137 mcg by mouth daily. 06/30/20   [provider]  tiZANidine (ZANAFLEX) 4 MG tablet Take 4 mg by mouth 2 (two) times daily. 08/02/20   [provider]  traMADol (ULTRAM) 50 MG tablet Take by mouth. 03/25/20   [provider]    Allergies    Codeine and Sulfonamide derivatives  Review of Systems   Review of Systems  Constitutional: Positive for fatigue. Negative for fever.  HENT: Negative for sore throat.   Eyes: Negative for visual disturbance.  Respiratory: Negative for cough and shortness of breath.   Cardiovascular: Negative for chest pain.  Gastrointestinal: Positive for nausea (only since hitting head). Negative for abdominal pain, blood in stool, constipation and diarrhea.  Genitourinary: Negative for difficulty urinating and dysuria.  Musculoskeletal: Negative for back pain and neck pain.  Skin: Negative for rash.  Neurological: Positive  for speech difficulty (slurrec speech per family last week), numbness (1-2 nights ago and last week) and headaches. Negative for syncope.    Physical Exam Updated Vital Signs BP 127/60   Pulse 79   Temp (!) 97.2 F (36.2 C) (Oral)   Resp 16   SpO2 100%   Physical Exam Vitals and nursing note reviewed.  Constitutional:      General: She is not in acute distress.    Appearance: She is well-developed and well-nourished. She is ill-appearing and toxic-appearing. She is not diaphoretic.  HENT:     Head: Normocephalic.     Comments: Dry mucous membranes 4cm laceration back of head    Mouth/Throat:     Mouth: Mucous membranes are dry.  Eyes:     Extraocular Movements: EOM normal.     Conjunctiva/sclera: Conjunctivae normal.  Cardiovascular:     Rate and Rhythm: Normal rate and regular rhythm.     Pulses: Intact distal pulses.     Heart sounds: Normal heart sounds. No murmur heard. No friction rub. No gallop.   Pulmonary:     Effort: Pulmonary effort is normal. No respiratory distress.     Breath sounds: Normal breath sounds. No wheezing or rales.  Abdominal:     General: There is no distension.     Palpations: Abdomen is soft.     Tenderness: There is no abdominal tenderness. There is no guarding.  Musculoskeletal:        General: No tenderness or edema.     Cervical back: Normal range  of motion.  Skin:    General: Skin is warm and dry.     Findings: No erythema or rash.     Comments: 4cm laceration back of head  Neurological:     Mental Status: She is oriented to person, place, and time.     Comments: Sleepy, eyes closed, but answers questions appropriately/oriented and able to provide history EOM normal, no facial droop, tongue and uvula midline, no pronator drift or drift of LE, strength appears equal bilaterally, reports altered sensation to LLE, has extinction LLE but not LUE.     ED Results / Procedures / Treatments   Labs (all labs ordered are listed, but only  abnormal results are displayed) Labs Reviewed  CBC WITH DIFFERENTIAL/PLATELET - Abnormal; Notable for the following components:      Result Value   WBC 13.2 (*)    Hemoglobin 9.3 (*)    HCT 33.2 (*)    MCV 76.0 (*)    MCH 21.3 (*)    MCHC 28.0 (*)    RDW 17.5 (*)    Neutro Abs 9.0 (*)    Monocytes Absolute 1.4 (*)    Abs Immature Granulocytes 0.08 (*)    All other components within normal limits  COMPREHENSIVE METABOLIC PANEL - Abnormal; Notable for the following components:   Sodium 130 (*)    Chloride 95 (*)    Glucose, Bld 149 (*)    Creatinine, Ser 1.26 (*)    GFR, Estimated 44 (*)    All other components within normal limits  LACTIC ACID, PLASMA - Abnormal; Notable for the following components:   Lactic Acid, Venous 2.8 (*)    All other components within normal limits  I-STAT VENOUS BLOOD GAS, ED - Abnormal; Notable for the following components:   Bicarbonate 30.6 (*)    Acid-Base Excess 5.0 (*)    Sodium 134 (*)    Calcium, Ion 1.14 (*)    HCT 31.0 (*)    Hemoglobin 10.5 (*)    All other components within normal limits  I-STAT CHEM 8, ED - Abnormal; Notable for the following components:   Sodium 133 (*)    Chloride 95 (*)    Creatinine, Ser 1.20 (*)    Glucose, Bld 144 (*)    Hemoglobin 10.9 (*)    HCT 32.0 (*)    All other components within normal limits  CBG MONITORING, ED - Abnormal; Notable for the following components:   Glucose-Capillary 144 (*)    All other components within normal limits  URINE CULTURE  RESP PANEL BY RT-PCR (FLU A&B, COVID) ARPGX2  RESPIRATORY PANEL BY RT PCR (FLU A&B, COVID)  ETHANOL  URINALYSIS, ROUTINE W REFLEX MICROSCOPIC  LACTIC ACID, PLASMA  RAPID URINE DRUG SCREEN, HOSP PERFORMED  TROPONIN I (HIGH SENSITIVITY)  TROPONIN I (HIGH SENSITIVITY)    EKG EKG Interpretation  Date/Time:  Tuesday September 27 2020 20:01:51 EST Ventricular Rate:  75 PR Interval:    QRS Duration: 82 QT Interval:  410 QTC Calculation: 458 R  Axis:   4 Text Interpretation: Sinus rhythm No significant change since last tracing Confirmed by Gareth Morgan [phone]) on 09/27/2020 11:59:52 PM   Radiology CT Angio Head W or Wo Contrast  Result Date: 09/27/2020 CLINICAL DATA:  Initial evaluation for neuro deficit, stroke suspected. EXAM: CT ANGIOGRAPHY HEAD AND NECK TECHNIQUE: Multidetector CT imaging of the head and neck was performed using the standard protocol during bolus administration of intravenous contrast. Multiplanar CT image reconstructions and MIPs  were obtained to evaluate the vascular anatomy. Carotid stenosis measurements (when applicable) are obtained utilizing NASCET criteria, using the distal internal carotid diameter as the denominator. CONTRAST:  64mL OMNIPAQUE IOHEXOL 350 MG/ML SOLN COMPARISON:  Prior MRA from 02/21/2012. FINDINGS: CTA NECK FINDINGS Aortic arch: Visualized aortic arch of normal caliber with normal branch pattern. Mild atheromatous change about the aortic arch and origin of the great vessels. No hemodynamically significant stenosis about the origin of the great vessels. Right carotid system: Right CCA tortuous proximally but widely patent to the bifurcation. Minimal atheromatous plaque about the right bifurcation without stenosis. Right ICA widely patent distally to the skull base without stenosis, dissection or occlusion. Left carotid system: Left CCA tortuous proximally but is widely patent to the bifurcation without stenosis. Minimal atheromatous plaque about the left bifurcation without stenosis. Left ICA mildly tortuous but widely patent to the skull base without stenosis, dissection or occlusion. Vertebral arteries: Both vertebral arteries arise from subclavian arteries. No proximal subclavian artery stenosis. Vertebral arteries tortuous proximally but are widely patent without stenosis, dissection or occlusion. Skeleton: No visible acute osseous abnormality. No discrete or worrisome osseous lesions. Patient  is status post prior fusion at C4 through C7. Other neck: No other acute soft tissue abnormality within the neck. No mass or adenopathy. 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 Upper chest: Mild scattered ground-glass opacity with interlobular septal thickening within the visualized lungs suggest a degree of pulmonary interstitial edema. Visualized upper chest demonstrates no other acute finding. Review of the MIP images confirms the above findings CTA HEAD FINDINGS Anterior circulation: Petrous segments widely patent bilaterally. Scattered atheromatous change within the carotid siphons without significant stenosis. A1 segments widely patent. Normal anterior communicating artery complex. Anterior cerebral arteries widely patent to their distal aspects. No M1 stenosis or occlusion. Normal MCA bifurcations. Distal MCA branches well perfused and symmetric. Posterior circulation: Both V4 segments widely patent to the vertebrobasilar junction. Both PICA origins patent and normal. Basilar widely patent to its distal aspect. Superior cerebral arteries patent bilaterally. Both PCAs primarily supplied via the basilar well perfused to their distal aspects. Venous sinuses: Grossly patent allowing for timing the contrast bolus. Anatomic variants: None significant.  No intracranial aneurysm. Review of the MIP images confirms the above findings IMPRESSION: 1. Negative CTA for large vessel occlusion. 2. Mild for age atheromatous change about the major arterial vasculature of the head and neck as above. No hemodynamically significant or correctable stenosis. 3. Diffuse tortuosity of the major arterial vasculature of the neck, suggesting chronic underlying hypertension. 4. Scattered ground-glass opacity with interlobular septal thickening within the visualized lungs, suggesting a degree of pulmonary interstitial edema. Correlation with plain film radiography suggested. Electronically Signed   By: Jeannine Boga M.D.   On: 09/27/2020  23:35   CT Angio Neck W and/or Wo Contrast  Result Date: 09/27/2020 CLINICAL DATA:  Initial evaluation for neuro deficit, stroke suspected. EXAM: CT ANGIOGRAPHY HEAD AND NECK TECHNIQUE: Multidetector CT imaging of the head and neck was performed using the standard protocol during bolus administration of intravenous contrast. Multiplanar CT image reconstructions and MIPs were obtained to evaluate the vascular anatomy. Carotid stenosis measurements (when applicable) are obtained utilizing NASCET criteria, using the distal internal carotid diameter as the denominator. CONTRAST:  49mL OMNIPAQUE IOHEXOL 350 MG/ML SOLN COMPARISON:  Prior MRA from 02/21/2012. FINDINGS: CTA NECK FINDINGS Aortic arch: Visualized aortic arch of normal caliber with normal branch pattern. Mild atheromatous change about the aortic arch and origin of the great vessels. No  hemodynamically significant stenosis about the origin of the great vessels. Right carotid system: Right CCA tortuous proximally but widely patent to the bifurcation. Minimal atheromatous plaque about the right bifurcation without stenosis. Right ICA widely patent distally to the skull base without stenosis, dissection or occlusion. Left carotid system: Left CCA tortuous proximally but is widely patent to the bifurcation without stenosis. Minimal atheromatous plaque about the left bifurcation without stenosis. Left ICA mildly tortuous but widely patent to the skull base without stenosis, dissection or occlusion. Vertebral arteries: Both vertebral arteries arise from subclavian arteries. No proximal subclavian artery stenosis. Vertebral arteries tortuous proximally but are widely patent without stenosis, dissection or occlusion. Skeleton: No visible acute osseous abnormality. No discrete or worrisome osseous lesions. Patient is status post prior fusion at C4 through C7. Other neck: No other acute soft tissue abnormality within the neck. No mass or adenopathy. Thyroid appears  to be absent. Upper chest: Mild scattered ground-glass opacity with interlobular septal thickening within the visualized lungs suggest a degree of pulmonary interstitial edema. Visualized upper chest demonstrates no other acute finding. Review of the MIP images confirms the above findings CTA HEAD FINDINGS Anterior circulation: Petrous segments widely patent bilaterally. Scattered atheromatous change within the carotid siphons without significant stenosis. A1 segments widely patent. Normal anterior communicating artery complex. Anterior cerebral arteries widely patent to their distal aspects. No M1 stenosis or occlusion. Normal MCA bifurcations. Distal MCA branches well perfused and symmetric. Posterior circulation: Both V4 segments widely patent to the vertebrobasilar junction. Both PICA origins patent and normal. Basilar widely patent to its distal aspect. Superior cerebral arteries patent bilaterally. Both PCAs primarily supplied via the basilar well perfused to their distal aspects. Venous sinuses: Grossly patent allowing for timing the contrast bolus. Anatomic variants: None significant.  No intracranial aneurysm. Review of the MIP images confirms the above findings IMPRESSION: 1. Negative CTA for large vessel occlusion. 2. Mild for age atheromatous change about the major arterial vasculature of the head and neck as above. No hemodynamically significant or correctable stenosis. 3. Diffuse tortuosity of the major arterial vasculature of the neck, suggesting chronic underlying hypertension. 4. Scattered ground-glass opacity with interlobular septal thickening within the visualized lungs, suggesting a degree of pulmonary interstitial edema. Correlation with plain film radiography suggested. Electronically Signed   By: Jeannine Boga M.D.   On: 09/27/2020 23:35   DG Chest Portable 1 View  Result Date: 09/27/2020 CLINICAL DATA:  79 year old female with altered mental status. EXAM: PORTABLE CHEST 1 VIEW  COMPARISON:  Chest radiograph dated 08/05/2020. FINDINGS: Minimal bibasilar atelectasis. No focal consolidation, pleural effusion, pneumothorax. The cardiac silhouette is within limits. No acute osseous pathology. Cervical spine ACDF. IMPRESSION: No active disease. Electronically Signed   By: Anner Crete M.D.   On: 09/27/2020 21:35    Procedures .Critical Care Performed by: Gareth Morgan, MD Authorized by: Gareth Morgan, MD   Critical care provider statement:    Critical care time (minutes):  45   Critical care was time spent personally by me on the following activities:  Discussions with consultants, evaluation of patient's response to treatment, examination of patient, ordering and performing treatments and interventions, ordering and review of laboratory studies, ordering and review of radiographic studies, pulse oximetry, re-evaluation of patient's condition, obtaining history from patient or surrogate and review of old charts .Marland KitchenLaceration Repair  Date/Time: 09/28/2020 1:51 AM Performed by: Gareth Morgan, MD Authorized by: Gareth Morgan, MD   Consent:    Consent obtained:  Verbal   Consent given  by:  Patient   Risks, benefits, and alternatives were discussed: yes     Risks discussed:  Pain and infection   Alternatives discussed:  No treatment Universal protocol:    Procedure explained and questions answered to patient or proxy's satisfaction: yes     Imaging studies available: yes     Required blood products, implants, devices, and special equipment available: yes     Site/side marked: yes     Immediately prior to procedure, a time out was called: yes   Anesthesia:    Anesthesia method:  Local infiltration   Local anesthetic:  Lidocaine 2% WITH epi Laceration details:    Location:  Scalp   Scalp location:  Occipital   Length (cm):  4 Pre-procedure details:    Preparation:  Patient was prepped and draped in usual sterile fashion Treatment:    Area cleansed  with:  Chlorhexidine   Amount of cleaning:  Standard   Irrigation solution:  Sterile saline Skin repair:    Repair method:  Staples   Number of staples:  4 Repair type:    Repair type:  Simple   (including critical care time)  Medications Ordered in ED Medications  sodium chloride 0.9 % bolus 1,000 mL (0 mLs Intravenous Stopped 09/27/20 2353)  naloxone (NARCAN) injection 0.4 mg (0.4 mg Intravenous Given 09/27/20 2212)  iohexol (OMNIPAQUE) 350 MG/ML injection 75 mL (75 mLs Intravenous Contrast Given 09/27/20 2234)  lidocaine-EPINEPHrine (XYLOCAINE W/EPI) 1 %-1:100000 (with pres) injection 10 mL (10 mLs Intradermal Given 09/28/20 0117)    ED Course  I have reviewed the triage vital signs and the nursing notes.  Pertinent labs & imaging results that were available during my care of the patient were reviewed by me and considered in my medical decision making (see chart for details).    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  DDx includes includes cardiac arrhythmia, electrolyte abnormality including hyper/hypoglycemia/HHS/DKA, CVA, seizure, hypovolemia including dehydration, anemia/GI bleed, infection, medication effects.  No chest pain, abdominal pain or dyspnea to suggest PE/ACS/dissection or AAA.  Left sided lower extremity numbness noted on initial exam--had reported episode of this last week, patient did not note this until I examined her today--in setting of potentially continuous but not noticed left sided symptoms (?episode last week per family, patient reported another episode 1-2 nights ago, did not note symptoms until examined today)  with syncope, sleepiness, hx of prior glucose derangements, did not call Code Stroke with risk of tPA outweighing potential benefits given question of timing. Did order CTA head and neck for evaluation for bleed, aneurysm, thrombosis or narrowing which shows no acute abnormalities. CT CSpine with fusion and no acute fx.    Labs show mild Cr elevation. No sign of HHS/DKA/hypoglycemia. Given narcan without significant response per nursing. Lactic with mild elevation, mild leukocytosis of 13. No sign of pneumonia on CXR. CT shows possible groundglass opacities concerning for fluid overload however patient appears dry.  UA pending. Abnormalities not clearly from sepsis at this time--lactic acid elevation may be secondary to dehydration.  Laceration repaired with staples to  be removed in 7 days.   Will admit to hospital for syncope, encephalopathy, r/o CVA with consult from Neurology, Dr. Lorrin Goodell.    Final Clinical Impression(s) / ED Diagnoses Final diagnoses:  Syncope, unspecified syncope type  Left sided numbness  Encephalopathy  Laceration of occipital region of scalp, initial encounter  Fall, initial encounter    Rx / DC Orders ED Discharge Orders    None       Gareth Morgan, MD 09/28/20 [phone]

## 2020-09-28 NOTE — ED Notes (Signed)
Lunch Tray Ordered @ 1102. 

## 2020-09-28 NOTE — ED Notes (Signed)
Pt back from MRI 

## 2020-09-29 ENCOUNTER — Observation Stay (HOSPITAL_COMMUNITY): Payer: Medicare HMO

## 2020-09-29 DIAGNOSIS — S0101XA Laceration without foreign body of scalp, initial encounter: Secondary | ICD-10-CM | POA: Diagnosis not present

## 2020-09-29 LAB — PROTEIN ELECTROPHORESIS, SERUM
A/G Ratio: 1.2 (ref 0.7–1.7)
Albumin ELP: 3.7 g/dL (ref 2.9–4.4)
Alpha-1-Globulin: 0.2 g/dL (ref 0.0–0.4)
Alpha-2-Globulin: 0.8 g/dL (ref 0.4–1.0)
Beta Globulin: 1.1 g/dL (ref 0.7–1.3)
Gamma Globulin: 0.9 g/dL (ref 0.4–1.8)
Globulin, Total: 3 g/dL (ref 2.2–3.9)
Total Protein ELP: 6.7 g/dL (ref 6.0–8.5)

## 2020-09-29 LAB — GLUCOSE, CAPILLARY
Glucose-Capillary: 236 mg/dL — ABNORMAL HIGH (ref 70–99)
Glucose-Capillary: 230 mg/dL — ABNORMAL HIGH (ref 70–99)
Glucose-Capillary: 314 mg/dL — ABNORMAL HIGH (ref 70–99)
Glucose-Capillary: 141 mg/dL — ABNORMAL HIGH (ref 70–99)

## 2020-09-29 LAB — COMPREHENSIVE METABOLIC PANEL
ALT: 11 U/L (ref 0–44)
AST: 15 U/L (ref 15–41)
Albumin: 3.5 g/dL (ref 3.5–5.0)
Alkaline Phosphatase: 66 U/L (ref 38–126)
Anion gap: 12 (ref 5–15)
BUN: 11 mg/dL (ref 8–23)
CO2: 25 mmol/L (ref 22–32)
Calcium: 8.9 mg/dL (ref 8.9–10.3)
Chloride: 96 mmol/L — ABNORMAL LOW (ref 98–111)
Creatinine, Ser: 0.89 mg/dL (ref 0.44–1.00)
GFR, Estimated: >60 mL/min (ref >60)
Glucose, Bld: 225 mg/dL — ABNORMAL HIGH (ref 70–99)
Potassium: 3.2 mmol/L — ABNORMAL LOW (ref 3.5–5.1)
Sodium: 133 mmol/L — ABNORMAL LOW (ref 135–145)
Total Bilirubin: 0.8 mg/dL (ref 0.3–1.2)
Total Protein: 6.9 g/dL (ref 6.5–8.1)

## 2020-09-29 LAB — CBC WITH DIFFERENTIAL/PLATELET
Abs Immature Granulocytes: 0.07 10*3/uL (ref 0.00–0.07)
Basophils Absolute: 0.1 10*3/uL (ref 0.0–0.1)
Basophils Relative: 1 %
Eosinophils Absolute: 0.4 10*3/uL (ref 0.0–0.5)
Eosinophils Relative: 2 %
HCT: 31.5 % — ABNORMAL LOW (ref 36.0–46.0)
Hemoglobin: 9.2 g/dL — ABNORMAL LOW (ref 12.0–15.0)
Immature Granulocytes: 1 %
Lymphocytes Relative: 21 %
Lymphs Abs: 2.9 10*3/uL (ref 0.7–4.0)
MCH: 21.7 pg — ABNORMAL LOW (ref 26.0–34.0)
MCHC: 29.2 g/dL — ABNORMAL LOW (ref 30.0–36.0)
MCV: 74.3 fL — ABNORMAL LOW (ref 80.0–100.0)
Monocytes Absolute: 1.6 10*3/uL — ABNORMAL HIGH (ref 0.1–1.0)
Monocytes Relative: 11 %
Neutro Abs: 9.3 10*3/uL — ABNORMAL HIGH (ref 1.7–7.7)
Neutrophils Relative %: 64 %
Platelets: 505 10*3/uL — ABNORMAL HIGH (ref 150–400)
RBC: 4.24 MIL/uL (ref 3.87–5.11)
RDW: 17.3 % — ABNORMAL HIGH (ref 11.5–15.5)
WBC: 14.3 10*3/uL — ABNORMAL HIGH (ref 4.0–10.5)
nRBC: 0 % (ref 0.0–0.2)

## 2020-09-29 LAB — MAGNESIUM: Magnesium: 1.1 mg/dL — ABNORMAL LOW (ref 1.7–2.4)

## 2020-09-29 MED ORDER — ACETAMINOPHEN 500 MG PO TABS: 500.0000 mg | ORAL_TABLET | Freq: Three times a day (TID) | ORAL | 0 refills | Status: AC | PRN

## 2020-09-29 MED ORDER — PROMETHAZINE HCL 25 MG/ML IJ SOLN
12.5000 mg | Freq: Once | INTRAMUSCULAR | Status: AC
  Administered 2020-09-29: 12.5 mg via INTRAVENOUS
  Filled 2020-09-29: qty 1

## 2020-09-29 MED ORDER — MECLIZINE HCL 25 MG PO TABS: 25.0000 mg | ORAL_TABLET | Freq: Three times a day (TID) | ORAL | 0 refills | Status: DC | PRN

## 2020-09-29 MED ORDER — POTASSIUM CHLORIDE CRYS ER 20 MEQ PO TBCR
40.0000 meq | EXTENDED_RELEASE_TABLET | Freq: Once | ORAL | Status: AC
  Administered 2020-09-29: 40 meq via ORAL
  Filled 2020-09-29: qty 2

## 2020-09-29 MED ORDER — MAGNESIUM SULFATE 2 GM/50ML IV SOLN
2.0000 g | Freq: Once | INTRAVENOUS | Status: AC
  Administered 2020-09-29: 2 g via INTRAVENOUS
  Filled 2020-09-29: qty 50

## 2020-09-29 MED ORDER — LEVOTHYROXINE SODIUM 100 MCG PO TABS: 100.0000 ug | ORAL_TABLET | Freq: Every day | ORAL | 0 refills | Status: DC

## 2020-09-29 MED ORDER — ASPIRIN 81 MG PO CHEW: 81.0000 mg | CHEWABLE_TABLET | Freq: Every day | ORAL | Status: DC

## 2020-09-29 MED ORDER — ONDANSETRON HCL 4 MG PO TABS: 4.0000 mg | ORAL_TABLET | Freq: Three times a day (TID) | ORAL | 0 refills | Status: DC | PRN

## 2020-09-29 NOTE — Progress Notes (Signed)
Patient being discharged home.  Patient to be transported by her husband.  IV removed with the catheter intact.  Discharge instructions and prescription information given to the patient who verbalized understanding. 

## 2020-09-29 NOTE — TOC Transition Note (Signed)
Transition of Care [facility]) - CM/SW Discharge Note   Patient Details  Name: Jeanne Kim MRN: [id number] Date of Birth: 1941-10-07  [facility] CM/SW Contact:  Pollie Friar, RN Phone Number: 09/29/2020, 12:56 PM   Clinical Narrative:    Pt discharging home with [name] services through [facility]. [name] with [facility] accepted the referral. Pt has all needed DME at home including a cane.  Pt has supervision at home and transportation to home.   Final next level of care: Home w Home Health Services Barriers to Discharge: No Barriers Identified   Patient Goals and CMS Choice   CMS Medicare.gov Compare Post Acute Care list provided to:: Patient Choice offered to / list presented to : Patient  Discharge Placement                       Discharge Plan and Services                          HH Arranged: PT,OT [place]: [place] Date [place]: 09/29/20   Representative spoke with at [place]: [place] ([place]) Interventions     Readmission Risk Interventions No flowsheet data found.

## 2020-09-29 NOTE — Care Management Obs Status (Signed)
[place] NOTIFICATION   Patient Details  Name: Jeanne Kim MRN: [id number] Date of Birth: 06-14-1941   Medicare Observation Status Notification Given:  Yes    Pollie Friar, RN 09/29/2020, 10:42 AM

## 2020-09-29 NOTE — Discharge Summary (Addendum)
Jeanne Kim [id number] DOB: 06/27/1941 DOA: 09/27/2020  PCP: Neale Burly, MD  Admit date: 09/27/2020  Discharge date: 09/29/2020  Admitted From: Home   Disposition:  Home   Recommendations for Outpatient Follow-up:   Follow up with PCP in 1-2 weeks  PCP Please obtain BMP/CBC, 2 view CXR in 1week,  (see Discharge instructions)   PCP Please follow up on the following pending results: Check CBC, CMP, magnesium, TSH, a.m. cortisol in 2 weeks.  Please make sure patient follows with endocrine, neurology and cardiology in 1 to 2 weeks.   Home Health: None   Equipment/Devices: Cane  Consultations: Neuro Discharge Condition: Stable    CODE STATUS: Full    Diet Recommendation: Heart Healthy   Diet Order            Diet - low sodium heart healthy           Diet heart healthy/carb modified Room service appropriate? Yes; Fluid consistency: Thin  Diet effective now                  Chief Complaint  Patient presents with  . Fall    [facility], lac on back of head, LOC      Brief history of present illness from the day of admission and additional interim summary     Jeanne Kim is a 79 y.o. female with medical history significant of anxiety, arthritis, history of breast cancer, CAD, depression, GERD, hypertension, hyperlipidemia, hypothyroidism, IBS, OSA on CPAP, insulin-dependent type 2 diabetes presenting to the ED via EMS. For evaluation of syncope and altered mental status. Bystanders saw the patient have syncope and fall, hitting the back of her head and causing a laceration, he was brought to the ER where scans showed traumatic subdural hematoma and she was admitted.                                                                 Hospital Course     1.  Syncope, fall with traumatic subdural  hematoma, scalp laceration.  Reason for syncope not entirely clear although dysrhythmia cannot be ruled out, he was stable on telemetry, echocardiogram stable, EEG nonacute, repeat CT scan of the head stable, she was not orthostatic.  She was seen by neurology and neurosurgery reviewed her scans at the time of admission.  She has been cleared for home discharge by neurology, some nausea vomiting likely due to contusion.  Supportive care.  Of note her home aspirin will be resumed 2 weeks from today.  Outpatient neurology follow-up is recommended in 1 to 2 weeks.  Also I have ordered a 30-day event monitor for her which will be provided by [facility] cardiology and she should follow with cardiology within 2 weeks.  Has been requested not  to drive in writing till she has been cleared by neurology and cardiology.  Per PT had mild BPPV post trauma - resolved after PT session.  2.  Random a.m. low cortisol.  Unclear significance as she is not orthostatic at all and actually is hypertensive with good response upon standing up.  Outpatient follow-up with endocrine.  Also has TSH was slightly suppressed hence her Synthroid dose has been dropped.  3.  Hypokalemia and hypomagnesemia.  Replaced.    For her other chronic problems which include hypertension, dyslipidemia, OSA and DM type II Home regimen and home medications continued unchanged.   Discharge diagnosis     Principal Problem:   Syncope Active Problems:   Essential hypertension   Intracranial bleed (HCC)   Leukocytosis   AKI (acute kidney injury) ([place])    Discharge instructions    Discharge Instructions    Diet - low sodium heart healthy   Complete by: As directed    Discharge instructions   Complete by: As directed    Do not drive, operate heavy machinery, perform activities at heights, swimming or participation in water activities or provide baby sitting services until you have seen by Primary MD or a Neurologist and advised to do so  again.   Follow with Primary MD Neale Burly, MD along with the requested endocrinologist, neurologist and cardiologist in 1-2 weeks  Get CBC, CMP, TSH, Magnesium,  2 view Chest X ray -  checked next visit within 1 week by Primary MD   Activity: As tolerated with Full fall precautions use walker/cane & assistance as needed  Disposition Home    Diet: Heart Healthy Low Carb  Special Instructions: If you have smoked or chewed Tobacco  in the last 2 yrs please stop smoking, stop any regular Alcohol  and or any Recreational drug use.  On your next visit with your primary care physician please Get Medicines reviewed and adjusted.  Please request your Prim.MD to go over all Hospital Tests and Procedure/Radiological results at the follow up, please get all Hospital records sent to your Prim MD by signing hospital release before you go home.  If you experience worsening of your admission symptoms, develop shortness of breath, life threatening emergency, suicidal or homicidal thoughts you must seek medical attention immediately by calling 911 or calling your MD immediately  if symptoms less severe.  You Must read complete instructions/literature along with all the possible adverse reactions/side effects for all the Medicines you take and that have been prescribed to you. Take any new Medicines after you have completely understood and accpet all the possible adverse reactions/side effects.   Increase activity slowly   Complete by: As directed    No wound care   Complete by: As directed       Discharge Medications   Allergies as of 09/29/2020      Reactions   Codeine Hives   Sulfonamide Derivatives Hives      Medication List    STOP taking these medications   fentaNYL 25 MCG/HR Commonly known as: DURAGESIC   midodrine 10 MG tablet Commonly known as: PROAMATINE   tiZANidine 4 MG tablet Commonly known as: ZANAFLEX     TAKE these medications   acetaminophen 500 MG  tablet Commonly known as: TYLENOL Take 1 tablet (500 mg total) by mouth every 8 (eight) hours as needed for moderate pain or headache.   amLODipine 5 MG tablet Commonly known as: NORVASC Take 5 mg by mouth daily.  aspirin 81 MG chewable tablet Chew 1 tablet (81 mg total) by mouth daily. Start taking on: October 13, 2020 What changed: These instructions start on October 13, 2020. If you are unsure what to do until then, ask your doctor or other care provider.   atorvastatin 20 MG tablet Commonly known as: LIPITOR Take 1 tablet (20 mg total) by mouth daily.   CALCIUM-MAGNESIUM-ZINC PO Take 3 tablets by mouth daily.   Co Q-10 100 MG Caps Take 100 mg by mouth daily.   cyanocobalamin 1000 MCG/ML injection Commonly known as: (VITAMIN B-12) INJECT 1 ML INTO MUSCLE ONCE A MONTH What changed: See the new instructions.   doxepin 10 MG capsule Commonly known as: SINEQUAN Take 10 mg by mouth at bedtime.   DULoxetine 60 MG capsule Commonly known as: CYMBALTA Take 60 mg by mouth daily.   EPINEPHrine 0.3 mg/0.3 mL Soaj injection Commonly known as: EPI-PEN Inject 0.3 mg into the muscle as needed for anaphylaxis.   fexofenadine 30 MG tablet Commonly known as: ALLEGRA Take 30 mg by mouth daily.   Fish Oil 1000 MG Caps Take 1 capsule by mouth daily.   fluticasone 50 MCG/ACT nasal spray Commonly known as: FLONASE Place 2 sprays into the nose daily.   furosemide 40 MG tablet Commonly known as: LASIX Take 1 tablet (40 mg total) by mouth 3 (three) times a week. Take on Monday/Wednesday/Friday   gabapentin 300 MG capsule Commonly known as: NEURONTIN Take 300 mg by mouth at bedtime.   gentamicin 0.3 % ophthalmic solution Commonly known as: GARAMYCIN Place 1 drop into both eyes 3 (three) times daily as needed (red or irritated eyes).   Insulin Glargine-Lixisenatide 100-33 UNT-MCG/ML Sopn Inject 20-25 Units into the skin daily. Pt adjust between 20-25 units   levothyroxine 100  MCG tablet Commonly known as: SYNTHROID Take 1 tablet (100 mcg total) by mouth daily. New order ( not taking yet) What changed: Another medication with the same name was removed. Continue taking this medication, and follow the directions you see here.   Linzess 290 MCG Caps capsule Generic drug: linaclotide TAKE 1 CAPSULE BY MOUTH EVERY DAY What changed:   how much to take  when to take this   meclizine 25 MG tablet Commonly known as: ANTIVERT Take 1 tablet (25 mg total) by mouth 3 (three) times daily as needed for dizziness.   Melatonin 10 MG Tabs Take 10 mg by mouth at bedtime as needed (sleep).   metFORMIN 1000 MG tablet Commonly known as: GLUCOPHAGE Take 1,000 mg by mouth 2 (two) times daily with a meal.   metolazone 5 MG tablet Commonly known as: ZAROXOLYN Take 2.5 mg by mouth daily.   metoprolol succinate 50 MG 24 hr tablet Commonly known as: TOPROL-XL TAKE 1 TABLET BY MOUTH EVERY DAY   montelukast 10 MG tablet Commonly known as: SINGULAIR Take 10 mg by mouth at bedtime.   Needles & Syringes Misc 25G 5/8 inch needle on 3 ml syringe-Use once monthly for B12 injections   nitroGLYCERIN 0.4 MG SL tablet Commonly known as: NITROSTAT PLACE 1 TABLET (0.4 MG TOTAL) UNDER THE TONGUE EVERY 5 (FIVE) MINUTES AS NEEDED FOR CHEST PAIN.   ondansetron 4 MG tablet Commonly known as: Zofran Take 1 tablet (4 mg total) by mouth every 8 (eight) hours as needed for nausea or vomiting.   OneTouch Verio test strip Generic drug: glucose blood 1 each by Other route as directed.   pantoprazole 40 MG tablet Commonly known as: PROTONIX  Take 1 tablet (40 mg total) by mouth 2 (two) times daily.   potassium chloride 10 MEQ tablet Commonly known as: KLOR-CON Take 2 tablets (20 mEq total) by mouth 3 (three) times a week.   ramipril 5 MG capsule Commonly known as: ALTACE Take 1 capsule (5 mg total) by mouth at bedtime.            Durable Medical Equipment  (From admission,  onward)         Start     Ordered   09/29/20 0921  For home use only DME Cane  Once        09/29/20 0920           Follow-up Information    Hasanaj, Samul Dada, MD. Schedule an appointment as soon as possible for a visit in 1 week(s).   Specialty: Internal Medicine Contact information: [place] [id number] [phone]        Sherren Mocha, MD .   Specialty: Cardiology Contact information: [id number] [place] [address] [phone]        Renato Shin, MD. Schedule an appointment as soon as possible for a visit in 1 week(s).   Specialty: Endocrinology Why: Low Caortisol and TSH Contact information: [address] [place] [address] [phone]        [facility] Follow up.   Specialty: Neurology Why: NPH ? Contact information: [place] Follow up.   Why: the home health agency will contact you for the first home visit Contact information: [phone]              Major procedures and Radiology Reports - PLEASE review detailed and final reports thoroughly  -       EEG  Result Date: 09/28/2020 Lora Havens, MD     09/28/2020 12:26 PM Patient Name: Jeanne Kim MRN: [id number] Epilepsy Attending: Lora Havens Referring Physician/Provider: Dr. Shela Leff Date: 09/28/2020 Duration: 26.06 mins Patient history: 79 year old female with suspected posttraumatic subdural and subarachnoid hemorrhage.  EEG to evaluate for seizures. Level of alertness: Awake, asleep AEDs during EEG study: None Technical aspects: This EEG study was done with scalp electrodes positioned according to the 10-20 International system of electrode placement. Electrical activity was acquired at a sampling rate of 500Hz  and reviewed with a high frequency filter of 70Hz  and a low frequency filter of 1Hz .  EEG data were recorded continuously and digitally stored. Description: The posterior dominant rhythm consists of 8-9 Hz activity of moderate voltage (25-35 uV) seen predominantly in posterior head regions, symmetric and reactive to eye opening and eye closing. Sleep was characterized by vertex waves, sleep spindles (12 to 14 Hz), maximal frontocentral region. Hyperventilation and photic stimulation were not performed.   IMPRESSION: This study is within normal limits. No seizures or epileptiform discharges were seen throughout the recording. Lora Havens   CT Angio Head W or Wo Contrast  Addendum Date: 09/28/2020   ADDENDUM REPORT: 09/28/2020 04:51 ADDENDUM: At time of the initial dictation, the noncontrast head portion of this exam was not available for review due to technical air. Images are now available for review, with the following findings. Age-related cerebral atrophy with mild chronic small vessel ischemic disease. Tiny subdural hemorrhage overlies the left parietal convexity, measuring no more than 2 mm in maximal thickness. Additional scant  1-2 mm extra-axial hemorrhage overlies the right frontal convexity without mass effect. Scattered small volume subarachnoid blood seen overlying the anterior frontal convexities bilaterally. Patchy small volume hemorrhage present within the right sylvian fissure. Few small foci of likely subarachnoid blood overlie the peripheral right cerebellum (series 1, image 10). No associated mass effect. No acute large vessel territory infarct. No mass lesion or midline shift. Mild ventricular prominence somewhat out of proportion to cortical sulcation. No hyperdense vessel. Scattered vascular calcifications noted within the carotid siphons. Small right parietal scalp laceration/contusion. No calvarial fracture. Globes and orbital soft tissues demonstrate no acute finding. Paranasal sinuses are largely clear. No significant mastoid effusion. IMPRESSION: 1. Small 2 mm  subdural hemorrhage overlying the left parietal convexity, with additional 1-2 mm extra-axial collection overlying the anterior right frontal convexity. No associated mass effect. 2. Patchy small volume subarachnoid hemorrhage involving the anterior frontal lobes bilaterally, right sylvian fissure, and peripheral right cerebellum. 3. Small right parietal scalp contusion/laceration. No calvarial fracture. 4. Mild ventricular prominence somewhat out of proportion to cortical sulcation. While this finding may be related to underlying atrophy, a degree of NPH could be contributory. At time of this dictation, these findings have already been communicated to Dr. Marlowe Sax of the hospitalist service, communicated at 4:05 a.m. on 09/28/2020. Electronically Signed   By: Jeannine Boga M.D.   On: 09/28/2020 04:51   Result Date: 09/28/2020 CLINICAL DATA:  Initial evaluation for neuro deficit, stroke suspected. EXAM: CT ANGIOGRAPHY HEAD AND NECK TECHNIQUE: Multidetector CT imaging of the head and neck was performed using the standard protocol during bolus administration of intravenous contrast. Multiplanar CT image reconstructions and MIPs were obtained to evaluate the vascular anatomy. Carotid stenosis measurements (when applicable) are obtained utilizing NASCET criteria, using the distal internal carotid diameter as the denominator. CONTRAST:  26mL OMNIPAQUE IOHEXOL 350 MG/ML SOLN COMPARISON:  Prior MRA from 02/21/2012. FINDINGS: CTA NECK FINDINGS Aortic arch: Visualized aortic arch of normal caliber with normal branch pattern. Mild atheromatous change about the aortic arch and origin of the great vessels. No hemodynamically significant stenosis about the origin of the great vessels. Right carotid system: Right CCA tortuous proximally but widely patent to the bifurcation. Minimal atheromatous plaque about the right bifurcation without stenosis. Right ICA widely patent distally to the skull base without stenosis,  dissection or occlusion. Left carotid system: Left CCA tortuous proximally but is widely patent to the bifurcation without stenosis. Minimal atheromatous plaque about the left bifurcation without stenosis. Left ICA mildly tortuous but widely patent to the skull base without stenosis, dissection or occlusion. Vertebral arteries: Both vertebral arteries arise from subclavian arteries. No proximal subclavian artery stenosis. Vertebral arteries tortuous proximally but are widely patent without stenosis, dissection or occlusion. Skeleton: No visible acute osseous abnormality. No discrete or worrisome osseous lesions. Patient is status post prior fusion at C4 through C7. Other neck: No other acute soft tissue abnormality within the neck. No mass or adenopathy. 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 Upper chest: Mild scattered ground-glass opacity with interlobular septal thickening within the visualized lungs suggest a degree of pulmonary interstitial edema. Visualized upper chest demonstrates no other acute finding. Review of the MIP images confirms the above findings CTA HEAD FINDINGS Anterior circulation: Petrous segments widely patent bilaterally. Scattered atheromatous change within the carotid siphons without significant stenosis. A1 segments widely patent. Normal anterior communicating artery complex. Anterior cerebral arteries widely patent to their distal aspects. No M1 stenosis or occlusion. Normal MCA bifurcations. Distal MCA branches well perfused and  symmetric. Posterior circulation: Both V4 segments widely patent to the vertebrobasilar junction. Both PICA origins patent and normal. Basilar widely patent to its distal aspect. Superior cerebral arteries patent bilaterally. Both PCAs primarily supplied via the basilar well perfused to their distal aspects. Venous sinuses: Grossly patent allowing for timing the contrast bolus. Anatomic variants: None significant.  No intracranial aneurysm. Review of the MIP images  confirms the above findings IMPRESSION: 1. Negative CTA for large vessel occlusion. 2. Mild for age atheromatous change about the major arterial vasculature of the head and neck as above. No hemodynamically significant or correctable stenosis. 3. Diffuse tortuosity of the major arterial vasculature of the neck, suggesting chronic underlying hypertension. 4. Scattered ground-glass opacity with interlobular septal thickening within the visualized lungs, suggesting a degree of pulmonary interstitial edema. Correlation with plain film radiography suggested. Electronically Signed: By: Jeannine Boga M.D. On: 09/27/2020 23:35   CT HEAD WO CONTRAST  Result Date: 09/29/2020 CLINICAL DATA:  Follow-up subarachnoid hemorrhage. EXAM: CT HEAD WITHOUT CONTRAST TECHNIQUE: Contiguous axial images were obtained from the base of the skull through the vertex without intravenous contrast. COMPARISON:  Yesterday FINDINGS: Brain: Trace bifrontal and right lateral cerebellar subarachnoid hemorrhage is non progressed. No interval hemorrhage. Cerebral volume loss, central predominant with ventriculomegaly. No visible infarct, extra-axial collection, or mass. Vascular: No hyperdense vessel or unexpected calcification. Skull: Normal. Negative for fracture or focal lesion. Sinuses/Orbits: Bilateral cataract resection IMPRESSION: No new abnormality, including recurrent hemorrhage. Electronically Signed   By: Monte Fantasia M.D.   On: 09/29/2020 08:07   CT HEAD WO CONTRAST  Result Date: 09/28/2020 CLINICAL DATA:  Previous subdural and subarachnoid hemorrhage EXAM: CT HEAD WITHOUT CONTRAST TECHNIQUE: Contiguous axial images were obtained from the base of the skull through the vertex without intravenous contrast. COMPARISON:  09/28/2020, 09/27/2020 FINDINGS: Brain: Areas of subarachnoid hemorrhage along the bilateral frontal lobes, right sylvian fissure, and right cerebellar hemisphere are again noted, slightly decreased in  prominence since prior study. The thin subdural hematoma seen in the left parietal and right frontal region on prior studies are not well visualized on this exam. No acute infarct. Stable prominence of the lateral ventricles. No mass effect. Vascular: No hyperdense vessel or unexpected calcification. Skull: Normal. Negative for fracture or focal lesion. Sinuses/Orbits: No acute finding. Other: None. IMPRESSION: 1. Persistent subarachnoid hemorrhage along the bilateral frontal lobes, right sylvian fissure, right sellar basilar hemisphere, decreased in prominence since prior study. 2. The thin subdural hematomas in the left parietal and right frontal region seen previously are not visualized on today's CT exam. 3. Stable prominence of the lateral ventricles. Electronically Signed   By: Randa Ngo M.D.   On: 09/28/2020 21:25   CT Angio Neck W and/or Wo Contrast  Addendum Date: 09/28/2020   ADDENDUM REPORT: 09/28/2020 04:51 ADDENDUM: At time of the initial dictation, the noncontrast head portion of this exam was not available for review due to technical air. Images are now available for review, with the following findings. Age-related cerebral atrophy with mild chronic small vessel ischemic disease. Tiny subdural hemorrhage overlies the left parietal convexity, measuring no more than 2 mm in maximal thickness. Additional scant 1-2 mm extra-axial hemorrhage overlies the right frontal convexity without mass effect. Scattered small volume subarachnoid blood seen overlying the anterior frontal convexities bilaterally. Patchy small volume hemorrhage present within the right sylvian fissure. Few small foci of likely subarachnoid blood overlie the peripheral right cerebellum (series 1, image 10). No associated mass effect. No acute large vessel  territory infarct. No mass lesion or midline shift. Mild ventricular prominence somewhat out of proportion to cortical sulcation. No hyperdense vessel. Scattered vascular  calcifications noted within the carotid siphons. Small right parietal scalp laceration/contusion. No calvarial fracture. Globes and orbital soft tissues demonstrate no acute finding. Paranasal sinuses are largely clear. No significant mastoid effusion. IMPRESSION: 1. Small 2 mm subdural hemorrhage overlying the left parietal convexity, with additional 1-2 mm extra-axial collection overlying the anterior right frontal convexity. No associated mass effect. 2. Patchy small volume subarachnoid hemorrhage involving the anterior frontal lobes bilaterally, right sylvian fissure, and peripheral right cerebellum. 3. Small right parietal scalp contusion/laceration. No calvarial fracture. 4. Mild ventricular prominence somewhat out of proportion to cortical sulcation. While this finding may be related to underlying atrophy, a degree of NPH could be contributory. At time of this dictation, these findings have already been communicated to Dr. Marlowe Sax of the hospitalist service, communicated at 4:05 a.m. on 09/28/2020. Electronically Signed   By: Jeannine Boga M.D.   On: 09/28/2020 04:51   Result Date: 09/28/2020 CLINICAL DATA:  Initial evaluation for neuro deficit, stroke suspected. EXAM: CT ANGIOGRAPHY HEAD AND NECK TECHNIQUE: Multidetector CT imaging of the head and neck was performed using the standard protocol during bolus administration of intravenous contrast. Multiplanar CT image reconstructions and MIPs were obtained to evaluate the vascular anatomy. Carotid stenosis measurements (when applicable) are obtained utilizing NASCET criteria, using the distal internal carotid diameter as the denominator. CONTRAST:  16mL OMNIPAQUE IOHEXOL 350 MG/ML SOLN COMPARISON:  Prior MRA from 02/21/2012. FINDINGS: CTA NECK FINDINGS Aortic arch: Visualized aortic arch of normal caliber with normal branch pattern. Mild atheromatous change about the aortic arch and origin of the great vessels. No hemodynamically significant stenosis  about the origin of the great vessels. Right carotid system: Right CCA tortuous proximally but widely patent to the bifurcation. Minimal atheromatous plaque about the right bifurcation without stenosis. Right ICA widely patent distally to the skull base without stenosis, dissection or occlusion. Left carotid system: Left CCA tortuous proximally but is widely patent to the bifurcation without stenosis. Minimal atheromatous plaque about the left bifurcation without stenosis. Left ICA mildly tortuous but widely patent to the skull base without stenosis, dissection or occlusion. Vertebral arteries: Both vertebral arteries arise from subclavian arteries. No proximal subclavian artery stenosis. Vertebral arteries tortuous proximally but are widely patent without stenosis, dissection or occlusion. Skeleton: No visible acute osseous abnormality. No discrete or worrisome osseous lesions. Patient is status post prior fusion at C4 through C7. Other neck: No other acute soft tissue abnormality within the neck. No mass or adenopathy. 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 Upper chest: Mild scattered ground-glass opacity with interlobular septal thickening within the visualized lungs suggest a degree of pulmonary interstitial edema. Visualized upper chest demonstrates no other acute finding. Review of the MIP images confirms the above findings CTA HEAD FINDINGS Anterior circulation: Petrous segments widely patent bilaterally. Scattered atheromatous change within the carotid siphons without significant stenosis. A1 segments widely patent. Normal anterior communicating artery complex. Anterior cerebral arteries widely patent to their distal aspects. No M1 stenosis or occlusion. Normal MCA bifurcations. Distal MCA branches well perfused and symmetric. Posterior circulation: Both V4 segments widely patent to the vertebrobasilar junction. Both PICA origins patent and normal. Basilar widely patent to its distal aspect. Superior cerebral  arteries patent bilaterally. Both PCAs primarily supplied via the basilar well perfused to their distal aspects. Venous sinuses: Grossly patent allowing for timing the contrast bolus. Anatomic variants: None significant.  No intracranial aneurysm. Review of the MIP images confirms the above findings IMPRESSION: 1. Negative CTA for large vessel occlusion. 2. Mild for age atheromatous change about the major arterial vasculature of the head and neck as above. No hemodynamically significant or correctable stenosis. 3. Diffuse tortuosity of the major arterial vasculature of the neck, suggesting chronic underlying hypertension. 4. Scattered ground-glass opacity with interlobular septal thickening within the visualized lungs, suggesting a degree of pulmonary interstitial edema. Correlation with plain film radiography suggested. Electronically Signed: By: Jeannine Boga M.D. On: 09/27/2020 23:35   MR BRAIN WO CONTRAST  Result Date: 09/28/2020 CLINICAL DATA:  Initial evaluation for acute syncope, fall, neuro deficit. EXAM: MRI HEAD WITHOUT CONTRAST TECHNIQUE: Multiplanar, multiecho pulse sequences of the brain and surrounding structures were obtained without intravenous contrast. COMPARISON:  Prior CTA from 09/27/2020. Additionally, comparison made with a prior noncontrast head CT from 09/27/2020, visible in PACS, but not completed at time of dictation of prior CTA or of this MRI, and not yet dictated. FINDINGS: Brain: Generalized age-related cerebral atrophy. Minimal chronic microvascular ischemic disease for age. No abnormal foci of restricted diffusion to suggest acute or subacute ischemia. Gray-white matter differentiation maintained. No encephalomalacia to suggest chronic cortical infarction. Thinning 2 mm subdural hematoma seen overlying the left parietal convexity (series 11, image 16). This is faintly visible on prior CT. There is an additional faint extra-axial collection overlying the right frontal  convexity (series 11, image 15), extremely difficult to visualized on prior CT. This measures 1-2 mm in maximal thickness. Scattered small volume subarachnoid blood seen along both frontal convexities in within the right sylvian fissure. Patchy small volume hemorrhage seen at the peripheral right cerebellum (series 14, image 15). Findings are also visible on prior CT, and overall not significantly changed or worsened. No mass lesion, midline shift, or mass effect. Diffuse ventricular prominence somewhat out of proportion to cortical sulcation, which could reflect a degree of NPH. No frank hydrocephalus or transependymal flow of CSF. Empty sella noted.  Midline structures intact. Vascular: Major intracranial vascular flow voids are maintained. Skull and upper cervical spine: Craniocervical junction within normal limits. Bone marrow signal intensity normal. Right parietal scalp laceration with skin staples in place. Sinuses/Orbits: Globes and orbital soft tissues demonstrate no acute finding. Patient status post bilateral ocular lens replacement. Mild scattered mucosal thickening noted with throughout the paranasal sinuses. Small left mastoid effusion noted, of doubtful significance. Inner ear structures grossly normal. Other: None. IMPRESSION: 1. Thin 2 mm subdural hematoma overlying the left parietal convexity, with additional 1-2 mm extra-axial collection overlying the right frontal convexity. No significant mass effect. 2. Scattered small volume posttraumatic subarachnoid hemorrhage overlying the bilateral frontal convexities, right sylvian fissure, and right cerebellum. 3. Diffuse ventricular prominence, somewhat out of proportion to cortical sulcation. While this finding may be related to underlying atrophy, a degree of NPH could be contributory. No transependymal flow of CSF. 4. No acute intracranial infarct or other abnormality. Critical Value/emergent results were called by telephone at the time of  interpretation on 09/28/2020 at 4:05 am to provider Dr. Rachell Cipro , who verbally acknowledged these results. Electronically Signed   By: Jeannine Boga M.D.   On: 09/28/2020 04:13   CT C-SPINE NO CHARGE  Result Date: 09/28/2020 CLINICAL DATA:  Initial evaluation for acute trauma, fall. EXAM: CT CERVICAL SPINE WITHOUT CONTRAST TECHNIQUE: Multidetector CT imaging of the cervical spine was performed without intravenous contrast. Multiplanar CT image reconstructions were also generated. COMPARISON:  Prior CTA from 09/27/2020.  FINDINGS: Alignment: Straightening of the normal cervical lordosis. Trace 2 mm anterolisthesis of C7 on T1, T1 on T2, and T2 on T3, chronic and facet mediated. Skull base and vertebrae: Skull base intact. Normal C1-2 articulations are preserved in the dens is intact. Vertebral body height maintained. No acute or chronic fracture. No discrete osseous lesions. Soft tissues and spinal canal: Paraspinous soft tissues demonstrate no acute abnormality. No abnormal prevertebral edema. Vascular structures described on prior CTA. Disc levels: Prior fusion at C4 through C7. Solid arthrodesis at C5 through C7, with pseudoarthrosis at C4-5. The right screw at the level of C4 is fractured (series 6, image 28). Minimal periprosthetic lucency about the contralateral left screw of C4 (series 3, image 51). Hardware otherwise intact without evidence for failure or other complication central disc protrusion at C2-3 with resultant mild spinal stenosis (series 4, image 37). Upper chest: Findings suggestive of mild pulmonary interstitial edema again noted within the visualized lungs. Other: None. IMPRESSION: 1. No acute traumatic injury within the cervical spine. 2. Prior fusion at C4 through C7 with solid arthrodesis at C5 through C7, with pseudoarthrosis at C4-5. The right screw at the level of C4 is fractured. Minimal periprosthetic lucency about the contralateral left screw of C4 suggesting  loosening. No other hardware complication. 3. Central disc protrusion at C2-3 with resultant mild spinal stenosis. 4. Findings suggestive of mild pulmonary interstitial edema within the visualized lungs. Electronically Signed   By: Jeannine Boga M.D.   On: 09/28/2020 03:11   DG Chest Portable 1 View  Result Date: 09/27/2020 CLINICAL DATA:  79 year old female with altered mental status. EXAM: PORTABLE CHEST 1 VIEW COMPARISON:  Chest radiograph dated 08/05/2020. FINDINGS: Minimal bibasilar atelectasis. No focal consolidation, pleural effusion, pneumothorax. The cardiac silhouette is within limits. No acute osseous pathology. Cervical spine ACDF. IMPRESSION: No active disease. Electronically Signed   By: Anner Crete M.D.   On: 09/27/2020 21:35   ECHOCARDIOGRAM COMPLETE  Result Date: 09/28/2020    ECHOCARDIOGRAM REPORT   Patient Name:   NATALIYA DALRYMPLE Date of Exam: 09/28/2020 Medical Rec #:  [id number]      Height:       64.0 in Accession #:    [id number]     Weight:       172.0 lb Date of Birth:  03-27-1941      BSA:          1.835 m Patient Age:    88 years       BP:           133/72 mmHg Patient Gender: F              HR:           87 bpm. Exam Location:  Inpatient Procedure: 2D Echo, Intracardiac Opacification Agent, Color Doppler and Cardiac            Doppler Indications:    Syncope 780.2 / R55  History:        Patient has prior history of Echocardiogram examinations, most                 recent 08/21/2019. CAD; Risk Factors:Hypertension and                 Dyslipidemia. Past history of breast cancer. GERD, thyroid.  Sonographer:    Darlina Sicilian RDCS Referring Phys: [id number] [place]  Sonographer Comments: EF estimateion by ED attempted. IMPRESSIONS  1. Left ventricular ejection fraction, by estimation, is 60  to 65%. The left ventricle has normal function. The left ventricle has no regional wall motion abnormalities. Left ventricular diastolic parameters are consistent with Grade I  diastolic dysfunction (impaired relaxation).  2. Right ventricular systolic function is normal. The right ventricular size is normal. There is normal pulmonary artery systolic pressure. The estimated right ventricular systolic pressure is 123456 mmHg.  3. The mitral valve is grossly normal. Trivial mitral valve regurgitation. No evidence of mitral stenosis.  4. The aortic valve is tricuspid. Aortic valve regurgitation is trivial. Mild aortic valve sclerosis is present, with no evidence of aortic valve stenosis.  5. The inferior vena cava is normal in size with greater than 50% respiratory variability, suggesting right atrial pressure of 3 mmHg. Comparison(s): No significant change from prior study. FINDINGS  Left Ventricle: Left ventricular ejection fraction, by estimation, is 60 to 65%. The left ventricle has normal function. The left ventricle has no regional wall motion abnormalities. Definity contrast agent was given IV to delineate the left ventricular  endocardial borders. The left ventricular internal cavity size was normal in size. There is no left ventricular hypertrophy. Left ventricular diastolic parameters are consistent with Grade I diastolic dysfunction (impaired relaxation). Right Ventricle: The right ventricular size is normal. No increase in right ventricular wall thickness. Right ventricular systolic function is normal. There is normal pulmonary artery systolic pressure. The tricuspid regurgitant velocity is 2.66 m/s, and  with an assumed right atrial pressure of 3 mmHg, the estimated right ventricular systolic pressure is 123456 mmHg. Left Atrium: Left atrial size was normal in size. Right Atrium: Right atrial size was normal in size. Pericardium: Trivial pericardial effusion is present. Presence of pericardial fat pad. Mitral Valve: The mitral valve is grossly normal. Mild mitral annular calcification. Trivial mitral valve regurgitation. No evidence of mitral valve stenosis. Tricuspid Valve: The  tricuspid valve is grossly normal. Tricuspid valve regurgitation is trivial. No evidence of tricuspid stenosis. Aortic Valve: The aortic valve is tricuspid. Aortic valve regurgitation is trivial. Mild aortic valve sclerosis is present, with no evidence of aortic valve stenosis. Pulmonic Valve: The pulmonic valve was grossly normal. Pulmonic valve regurgitation is not visualized. No evidence of pulmonic stenosis. Aorta: The aortic root and ascending aorta are structurally normal, with no evidence of dilitation. Venous: The inferior vena cava is normal in size with greater than 50% respiratory variability, suggesting right atrial pressure of 3 mmHg. IAS/Shunts: The atrial septum is grossly normal.  LEFT VENTRICLE PLAX 2D LVIDd:         4.80 cm  Diastology LVIDs:         3.10 cm  LV e' medial:    4.68 cm/s LV PW:         1.00 cm  LV E/e' medial:  16.9 LV IVS:        0.90 cm  LV e' lateral:   5.22 cm/s LVOT diam:     1.80 cm  LV E/e' lateral: 15.1 LV SV:         48 LV SV Index:   26 LVOT Area:     2.54 cm                         3D Volume EF                         LV 3D EF:    52.50 %  LV 3D EDV:   66800.00 mm                         LV 3D ESV:   31700.00 mm                         LV 3D SV:    35000.00 mm RIGHT VENTRICLE RV S prime:     11.20 cm/s TAPSE (M-mode): 2.1 cm LEFT ATRIUM             Index LA diam:        3.30 cm 1.80 cm/m LA Vol (A2C):   28.3 ml 15.42 ml/m LA Vol (A4C):   59.0 ml 32.15 ml/m LA Biplane Vol: 41.8 ml 22.78 ml/m  AORTIC VALVE LVOT Vmax:   116.00 cm/s LVOT Vmean:  74.200 cm/s LVOT VTI:    0.188 m  AORTA Ao Root diam: 3.30 cm Ao Asc diam:  3.30 cm MITRAL VALVE                TRICUSPID VALVE MV Area (PHT): 3.21 cm     TR Peak grad:   28.3 mmHg MV Decel Time: 236 msec     TR Vmax:        266.00 cm/s MV E velocity: 79.00 cm/s MV A velocity: 121.00 cm/s  SHUNTS MV E/A ratio:  0.65         Systemic VTI:  0.19 m                             Systemic Diam: 1.80 cm Eleonore Chiquito MD Electronically signed by Eleonore Chiquito MD Signature Date/Time: 09/28/2020/11:16:01 AM    Final     Micro Results     Recent Results (from the past 240 hour(s))  Resp Panel by RT-PCR (Flu A&B, Covid) Nasopharyngeal Swab     Status: None   Collection Time: 09/28/20  1:03 AM   Specimen: Nasopharyngeal Swab; Nasopharyngeal(NP) swabs in vial transport medium  Result Value Ref Range Status   SARS Coronavirus 2 by RT PCR NEGATIVE NEGATIVE Final    Comment: (NOTE) SARS-CoV-2 target nucleic acids are NOT DETECTED.  The SARS-CoV-2 RNA is generally detectable in upper respiratory specimens during the acute phase of infection. The lowest concentration of SARS-CoV-2 viral copies this assay can detect is 138 copies/mL. A negative result does not preclude SARS-Cov-2 infection and should not be used as the sole basis for treatment or other patient management decisions. A negative result may occur with  improper specimen collection/handling, submission of specimen other than nasopharyngeal swab, presence of viral mutation(s) within the areas targeted by this assay, and inadequate number of viral copies(<138 copies/mL). A negative result must be combined with clinical observations, patient history, and epidemiological information. The expected result is Negative.  Fact Sheet for Patients:  EntrepreneurPulse.com.au  Fact Sheet for Healthcare Providers:  IncredibleEmployment.be  This test is no t yet approved or cleared by the [place] FDA and  has been authorized for detection and/or diagnosis of SARS-CoV-2 by FDA under an Emergency Use Authorization (EUA). This EUA will remain  in effect (meaning this test can be used) for the duration of the COVID-19 declaration under Section 564(b)(1) of the Act, 21 U.S.C.section 360bbb-3(b)(1), unless the authorization is terminated  or revoked sooner.       Influenza A by PCR NEGATIVE NEGATIVE Final  Influenza B by PCR NEGATIVE NEGATIVE Final    Comment: (NOTE) The Xpert Xpress SARS-CoV-2/FLU/RSV plus assay is intended as an aid in the diagnosis of influenza from Nasopharyngeal swab specimens and should not be used as a sole basis for treatment. Nasal washings and aspirates are unacceptable for Xpert Xpress SARS-CoV-2/FLU/RSV testing.  Fact Sheet for Patients: EntrepreneurPulse.com.au  Fact Sheet for Healthcare Providers: IncredibleEmployment.be  This test is not yet approved or cleared by the [place] FDA and has been authorized for detection and/or diagnosis of SARS-CoV-2 by FDA under an Emergency Use Authorization (EUA). This EUA will remain in effect (meaning this test can be used) for the duration of the COVID-19 declaration under Section 564(b)(1) of the Act, 21 U.S.C. section 360bbb-3(b)(1), unless the authorization is terminated or revoked.  Performed at [facility], [place] [address] [place], [place] [address]   Urine culture     Status: Abnormal   Collection Time: 09/28/20  4:27 AM   Specimen: Urine, Random  Result Value Ref Range Status   Specimen Description URINE, RANDOM  Final   Special Requests NONE  Final   Culture (A)  Final    <10,000 COLONIES/mL INSIGNIFICANT GROWTH Performed at [facility], [place] [address], [place], [place] [id number]    Report Status 09/28/2020 FINAL  Final    Today   Subjective    Jeanne Kim today has no headache,no chest abdominal pain,no new weakness tingling or numbness, feels much better wants to go home today.     Objective   Blood pressure 135/60, pulse 66, temperature 98.2 F (36.8 C), temperature source Oral, resp. rate 18, height 5\' 4"  (1.626 m), weight 78 kg, SpO2 99 %.   Intake/Output Summary (Last 24 hours) at 09/29/2020 1343 Last data filed at 09/29/2020 1000 Gross per 24 hour  Intake 50 ml  Output 1400 ml  Net -1350 ml    Exam  Awake Alert, No new  F.N deficits, Normal affect [place].AT,PERRAL Supple Neck,No JVD, No cervical lymphadenopathy appriciated.  Symmetrical Chest wall movement, Good air movement bilaterally, CTAB RRR,No Gallops,Rubs or new Murmurs, No Parasternal Heave +ve B.Sounds, Abd Soft, Non tender, No organomegaly appriciated, No rebound -guarding or rigidity. No Cyanosis, Clubbing or edema, No new Rash or bruise   Data Review   CBC w Diff:  Lab Results  Component Value Date   WBC 14.3 (H) 09/29/2020   HGB 9.2 (L) 09/29/2020   HGB 8.9 (L) 07/04/2020   HCT 31.5 (L) 09/29/2020   HCT 29.6 (L) 07/04/2020   PLT 505 (H) 09/29/2020   PLT 640 (H) 07/04/2020   LYMPHOPCT 21 09/29/2020   MONOPCT 11 09/29/2020   EOSPCT 2 09/29/2020   BASOPCT 1 09/29/2020    CMP:  Lab Results  Component Value Date   NA 133 (L) 09/29/2020   NA 136 07/14/2020   K 3.2 (L) 09/29/2020   CL 96 (L) 09/29/2020   CO2 25 09/29/2020   BUN 11 09/29/2020   BUN 19 07/14/2020   CREATININE 0.89 09/29/2020   PROT 6.9 09/29/2020   ALBUMIN 3.5 09/29/2020   BILITOT 0.8 09/29/2020   ALKPHOS 66 09/29/2020   AST 15 09/29/2020   ALT 11 09/29/2020  .   Total Time in preparing paper work, data evaluation and todays exam - 81 minutes  Lala Lund M.D on 09/29/2020 at 1:43 PM  [facility]

## 2020-09-29 NOTE — Treatment Plan (Signed)
Treatment Plan  Patient had improvement in CT done last night vs initial head CT; subdurals are resolved. Small cortical contusion seen in left frontal and possibly right parieto-occipital regions when that CT is windowed appropriately. Therefore, I think the nausea/vomiting are related to postconcussive syndrome. The EEG was negative. I doubt the 3rd head CT suggested by overnight team will change management. Would simply treat for postconcussive syndrome, symptomatically, with outpatient followup. I anticipate that this can all be managed as outpatient, but feel free to reach out to [place] if she remains in house (and further input is desired).  Thank you.  Perfecto Kingdom, MD

## 2020-09-29 NOTE — Plan of Care (Signed)
  Problem: Activity: Goal: Risk for activity intolerance will decrease 09/29/2020 1109 by Caroll Rancher, RN Outcome: Adequate for Discharge 09/29/2020 1107 by Caroll Rancher, RN Outcome: Progressing   Problem: Pain Managment: Goal: General experience of comfort will improve 09/29/2020 1109 by Caroll Rancher, RN Outcome: Adequate for Discharge 09/29/2020 1107 by Caroll Rancher, RN Outcome: Progressing

## 2020-09-29 NOTE — Discharge Instructions (Signed)
Do not drive, operate heavy machinery, perform activities at heights, swimming or participation in water activities or provide baby sitting services until you have seen by Primary MD or a Neurologist and advised to do so again.   Follow with Primary MD Neale Burly, MD along with the requested endocrinologist, neurologist and cardiologist in 1-2 weeks  Get CBC, CMP, TSH, Magnesium,  2 view Chest X ray -  checked next visit within 1 week by Primary MD    Activity: As tolerated with Full fall precautions use walker/cane & assistance as needed  Disposition Home    Diet: Heart Healthy Low Carb  Special Instructions: If you have smoked or chewed Tobacco  in the last 2 yrs please stop smoking, stop any regular Alcohol  and or any Recreational drug use.  On your next visit with your primary care physician please Get Medicines reviewed and adjusted.  Please request your Prim.MD to go over all Hospital Tests and Procedure/Radiological results at the follow up, please get all Hospital records sent to your Prim MD by signing hospital release before you go home.  If you experience worsening of your admission symptoms, develop shortness of breath, life threatening emergency, suicidal or homicidal thoughts you must seek medical attention immediately by calling 911 or calling your MD immediately  if symptoms less severe.  You Must read complete instructions/literature along with all the possible adverse reactions/side effects for all the Medicines you take and that have been prescribed to you. Take any new Medicines after you have completely understood and accpet all the possible adverse reactions/side effects.

## 2020-09-29 NOTE — Progress Notes (Signed)
Physical Therapy Treatment Patient Details Name: Jeanne Kim MRN: [id number] DOB: 04-08-1941 Today's Date: 09/29/2020    History of Present Illness Jeanne Kim is a 79 y.o. female with medical history significant of anxiety, arthritis, history of breast cancer, CAD, depression, GERD, hypertension, hyperlipidemia, hypothyroidism, IBS, OSA on CPAP, insulin-dependent type 2 diabetes presenting to the ED via EMS. For evaluation of syncope and altered mental status. Left parietal and right frontal SDH.    PT Comments    Return visit to assess effectiveness of Epley maneuver as pt scheduled to discharge home today. Sidelying test negative and pt denied vertigo as moving around the room. She was much less imbalanced than earlier, and still recommend she use a RW on discharge. RN and MD made aware    Follow Up Recommendations  Supervision/Assistance - 24 hour;Home health PT (insurance will cover HH per CM)     Equipment Recommendations  Other (comment) (pt reports owns a RW)    Recommendations for Other Services       Precautions / Restrictions Precautions Precautions: Fall Precaution Comments: reports multiple falls where she has no symptoms and then wakes up on the floor Restrictions Weight Bearing Restrictions: No    Mobility  Bed Mobility Overal bed mobility: Modified Independent Bed Mobility: Rolling;Sidelying to Sit;Sit to Sidelying Rolling: Modified independent (Device/Increase time) Sidelying to sit: Modified independent (Device/Increase time) Supine to sit: Supervision   Sit to sidelying: Min assist;Modified independent (Device/Increase time) General bed mobility comments: no vertigo or nystagmus to sidelying  Transfers Overall transfer level: Needs assistance Equipment used: Rolling walker (2 wheeled);1 person hand held assist Transfers: Sit to/from Stand Sit to Stand: Min guard         General transfer comment: vc for hand  placement  Ambulation/Gait Ambulation/Gait assistance: Min assist;Min guard Gait Distance (Feet): 80 Feet Assistive device: Rolling walker (2 wheeled) Gait Pattern/deviations: Step-through pattern Gait velocity: Decreased   General Gait Details: initially RW and much more steady with transition to no device and no HHA; improved but did have one small stagger step to her right and continue to recommend using RW. Discussed importance of no falls and no additional concussions   Stairs             Wheelchair Mobility    Modified Rankin (Stroke Patients Only)       Balance Overall balance assessment: Needs assistance Sitting-balance support: No upper extremity supported;Feet supported Sitting balance-Leahy Scale: Good     Standing balance support: No upper extremity supported;During functional activity Standing balance-Leahy Scale: Fair Standing balance comment: standing to wash hands at sink                            Cognition Arousal/Alertness: Awake/alert Behavior During Therapy: WFL for tasks assessed/performed Overall Cognitive Status: No family/caregiver present to determine baseline cognitive functioning                                 General Comments: Pt A and O X4.      Exercises      General Comments        Pertinent Vitals/Pain Pain Assessment: Faces Faces Pain Scale: Hurts a little bit Pain Location: headache Pain Descriptors / Indicators: Headache Pain Intervention(s): Limited activity within patient's tolerance;Monitored during session    Home Living  Prior Function            PT Goals (current goals can now be found in the care plan section) Acute Rehab PT Goals Patient Stated Goal: to feel better Time For Goal Achievement: 10/11/20 Potential to Achieve Goals: Good Progress towards PT goals: Progressing toward goals    Frequency    Min 3X/week      PT Plan Current plan  remains appropriate    Co-evaluation              AM-PAC PT "6 Clicks" Mobility   Outcome Measure  Help needed turning from your back to your side while in a flat bed without using bedrails?: A Little Help needed moving from lying on your back to sitting on the side of a flat bed without using bedrails?: A Little Help needed moving to and from a bed to a chair (including a wheelchair)?: A Little Help needed standing up from a chair using your arms (e.g., wheelchair or bedside chair)?: A Little Help needed to walk in hospital room?: A Little Help needed climbing 3-5 steps with a railing? : A Little 6 Click Score: 18    End of Session Equipment Utilized During Treatment: Gait belt Activity Tolerance: Treatment limited secondary to medical complications (Comment) (dizziness; nausea; vomiting) Patient left: in bed;with call bell/phone within reach;with bed alarm set Nurse Communication: Mobility status;Other (comment) (Ok for d/c from PT perspective) PT Visit Diagnosis: Unsteadiness on feet (R26.81);Muscle weakness (generalized) (M62.81)     Time: 9798-9211 PT Time Calculation (min) (ACUTE ONLY): 19 min  Charges:  $Gait Training: 8-22 mins $Canalith Rep Proc: 8-22 mins                      Arby Barrette, PT Pager [phone]    Rexanne Mano 09/29/2020, 1:54 PM

## 2020-09-29 NOTE — Progress Notes (Signed)
Occupational Therapy Treatment Patient Details Name: Jeanne Kim MRN: [id number] DOB: [birth year] Today's Date: 09/29/2020    History of present illness Jeanne Kim is a 79 y.o. female with medical history significant of anxiety, arthritis, history of breast cancer, CAD, depression, GERD, hypertension, hyperlipidemia, hypothyroidism, IBS, OSA on CPAP, insulin-dependent type 2 diabetes presenting to the ED via EMS. For evaluation of syncope and altered mental status. Left parietal and right frontal SDH.   OT comments  This 79 yo female admitted with above, seen today with still decreased balance and dizziness (but better balance with RW and less dizzy when she uses a target to focus on). Educated on safety with showering and recommendation that anytime she is up on her feet someone be with her and use gait belt. Pt will continue to benefit from acute OT if does not D/C today.     Follow Up Recommendations  Home health OT;Supervision/Assistance - 24 hour    Equipment Recommendations  None recommended by OT       Precautions / Restrictions Precautions Precautions: Fall Precaution Comments: reports multiple falls where she has no symptoms and then wakes up on the floor Restrictions Weight Bearing Restrictions: No       Mobility Bed Mobility Overal bed mobility: Needs Assistance Bed Mobility: Supine to Sit     Supine to sit: Supervision Sit to supine: Min guard;+2 for safety/equipment   General bed mobility comments: Pt reporting dizziness with transition to sitting. Requiring increased time and effort to come to sitting.  Transfers Overall transfer level: Needs assistance Equipment used: Rolling walker (2 wheeled) Transfers: Sit to/from Stand Sit to Stand: Min guard         General transfer comment: On initial stand from bed, pt got dizzy and had to sit back down. VCs to go at a slower pace, not look down when she went to stand but instead to find a target in front of  her to focus on while she stood--this worked much better for her.    Balance Overall balance assessment: Needs assistance Sitting-balance support: No upper extremity supported;Feet supported Sitting balance-Leahy Scale: Good     Standing balance support: No upper extremity supported;During functional activity Standing balance-Leahy Scale: Fair Standing balance comment: standing to wash hands at sink                           ADL either performed or assessed with clinical judgement   ADL Overall ADL's : Needs assistance/impaired     Grooming: Min guard;Standing;Wash/dry hands               Lower Body Dressing: Minimal assistance Lower Body Dressing Details (indicate cue type and reason): min guard A sit<>stand. Pt needed A due to she could not get her left leg in her underwear without bending over--which makes her dizziness worse.When pt went to initially stand she had to sit back down due to dizziness--took it slower second time and looked at target in front of her (reported this helped) Toilet Transfer: Min guard;Ambulation;RW;Regular Toilet;Grab bars   Toileting- Clothing Manipulation and Hygiene: Min guard;Sit to/from stand         General ADL Comments: We discussed safety with showering --that she really needs to use the shower seat and hold onto at grab bar if she stands up. Also recommended to her that she have someone with her anytime she is up on her feet. I gave her a gait belt  for her family to use with her.     Vision Patient Visual Report: No change from baseline            Cognition Arousal/Alertness: Awake/alert Behavior During Therapy: WFL for tasks assessed/performed Overall Cognitive Status: No family/caregiver present to determine baseline cognitive functioning                                 General Comments: Pt A and O X4. Decreased awareness of safety. When I first entered her room her light was going off and I asked her  if she needed anything due to this, she said no; then the NT walked in and said she had called to be setup with breakfast.. I asked her again if she had called for assistance and this time she said yes, when I asked why she had told me no eariler she said she could not do "two things at one time" (her dtr was on the phone with her when I walked in).                   Pertinent Vitals/ Pain       Pain Assessment: Faces Faces Pain Scale: Hurts a little bit Pain Location: headache ("but the dizziness is more of the issue") Pain Descriptors / Indicators: Headache Pain Intervention(s): Limited activity within patient's tolerance;Monitored during session         Frequency  Min 2X/week        Progress Toward Goals  OT Goals(current goals can now be found in the care plan section)   Yes  Acute Rehab OT Goals Patient Stated Goal: to feel better OT Goal Formulation: With patient Time For Goal Achievement: 10/12/20 Potential to Achieve Goals: Good  Plan Discharge plan remains appropriate       AM-PAC OT "6 Clicks" Daily Activity     Outcome Measure   Help from another person eating meals?: None Help from another person taking care of personal grooming?: A Little Help from another person toileting, which includes using toliet, bedpan, or urinal?: A Little Help from another person bathing (including washing, rinsing, drying)?: A Little Help from another person to put on and taking off regular upper body clothing?: A Little Help from another person to put on and taking off regular lower body clothing?: A Little 6 Click Score: 19    End of Session Equipment Utilized During Treatment: Gait belt;Rolling walker  OT Visit Diagnosis: Unsteadiness on feet (R26.81);Other abnormalities of gait and mobility (R26.89);Dizziness and giddiness (R42)   Activity Tolerance  (still dealing with dizziness when up and about)   Patient Left in chair;with call bell/phone within reach;with chair  alarm set   Nurse Communication          Time: [phone] OT Time Calculation (min): 39 min  Charges: OT General Charges $OT Visit: 1 Visit OT Treatments $Self Care/Home Management : 38-52 mins  [place]en Circle, OTR/L Acute [employer] Pager [phone] Office [phone]      Almon Register 09/29/2020, 12:43 PM

## 2020-09-29 NOTE — Progress Notes (Addendum)
Physical Therapy Treatment Patient Details Name: Jeanne Kim MRN: [id number] DOB: 05-01-1941 Today's Date: 09/29/2020    History of Present Illness Jeanne Kim is a 79 y.o. female with medical history significant of anxiety, arthritis, history of breast cancer, CAD, depression, GERD, hypertension, hyperlipidemia, hypothyroidism, IBS, OSA on CPAP, insulin-dependent type 2 diabetes presenting to the ED via EMS. For evaluation of syncope and altered mental status. Left parietal and right frontal SDH.    PT Comments    Patient initially denying dizziness, however with mobility appears to be dizzy with certain movements and then acknowledges she is having brief episodes of vertigo in conjunction with her nausea and vomiting. After gait training, vestibular assessment + rt posterior canalithiasis (BPPV). Patient very nauseated after sidelying test elicited vertigo and needs anti-emetic prior to attempting BPPV treatment.    Follow Up Recommendations  Supervision/Assistance - 24 hour     Equipment Recommendations  Rolling walker with 5" wheels    Recommendations for Other Services       Precautions / Restrictions       Vestibular Assessment  Precautions Precautions: Fall Precaution Comments: reports multiple falls where she has no symptoms and then wakes up on the floor Restrictions Weight Bearing Restrictions: No     09/29/20 1133  Vestibular Assessment  General Observation pt reports certain ways she moves her head causes the dizziness, nausea and vomiting  Symptom Behavior  Subjective history of current problem symptoms began after this fall  Type of Dizziness  Unsteady with head/body turns;Vertigo  Frequency of Dizziness daily  Duration of Dizziness seconds  Symptom Nature Motion provoked  Aggravating Factors  (pt unsure what motions)  Relieving Factors Slow movements  Progression of Symptoms Better  History of similar episodes no  Oculomotor Exam  Oculomotor  Alignment Normal  Spontaneous Absent  Gaze-induced  Absent  Smooth Pursuits Intact  Positional Testing  Sidelying Test Sidelying Right  Sidelying Right  Sidelying Right Duration 30  Sidelying Right Symptoms Upbeat, right rotatory nystagmus     Mobility  Bed Mobility Overal bed mobility: Needs Assistance Bed Mobility: Rolling;Sidelying to Sit;Sit to Sidelying Rolling: Modified independent (Device/Increase time) Sidelying to sit: Min guard Supine to sit: Supervision   Sit to sidelying: Min assist General bed mobility comments: +vertigo with sidelying test; bed mobility in conjunction with Epley  Transfers Overall transfer level: Needs assistance Equipment used: Rolling walker (2 wheeled);1 person hand held assist Transfers: Sit to/from Stand Sit to Stand: Min guard         General transfer comment: no device initially, but reaching for support and utilized HHA x 5 ft and finished remainder of session using RW for each transfer  Ambulation/Gait Ambulation/Gait assistance: Min assist Gait Distance (Feet): 25 Feet (5, 25) Assistive device: 1 person hand held assist;Rolling walker (2 wheeled) Gait Pattern/deviations: Step-through pattern;Gait velocity: Decreased   General Gait Details: cues for safe use of RW (needed RW due to staggering steps x 2 occurrences)   Stairs             Wheelchair Mobility    Modified Rankin (Stroke Patients Only)       Balance Overall balance assessment: Needs assistance Sitting-balance support: No upper extremity supported;Feet supported Sitting balance-Leahy Scale: Good     Standing balance support: No upper extremity supported;During functional activity Standing balance-Leahy Scale: Fair Standing balance comment: standing to wash hands at sink  Cognition Arousal/Alertness: Awake/alert Behavior During Therapy: WFL for tasks assessed/performed Overall Cognitive Status: No  family/caregiver present to determine baseline cognitive functioning                                 General Comments: Pt A and O X4.      Exercises      General Comments        Pertinent Vitals/Pain Pain Assessment: Faces Faces Pain Scale: Hurts a little bit Pain Location: headache Pain Descriptors / Indicators: Headache Pain Intervention(s): Limited activity within patient's tolerance;Monitored during session    Home Living                      Prior Function            PT Goals (current goals can now be found in the care plan section) Acute Rehab PT Goals Patient Stated Goal: to feel better Time For Goal Achievement: 10/11/20 Potential to Achieve Goals: Good Progress towards PT goals: Progressing toward goals    Frequency    Min 3X/week      PT Plan Current plan remains appropriate    Co-evaluation              AM-PAC PT "6 Clicks" Mobility   Outcome Measure  Help needed turning from your back to your side while in a flat bed without using bedrails?: A Little Help needed moving from lying on your back to sitting on the side of a flat bed without using bedrails?: A Little Help needed moving to and from a bed to a chair (including a wheelchair)?: A Little Help needed standing up from a chair using your arms (e.g., wheelchair or bedside chair)?: A Little Help needed to walk in hospital room?: A Little Help needed climbing 3-5 steps with a railing? : Total 6 Click Score: 16    End of Session Equipment Utilized During Treatment: Gait belt Activity Tolerance: Treatment limited secondary to medical complications (Comment) (dizziness; nausea; vomiting) Patient left: in bed;with call bell/phone within reach;with nursing/sitter in room Nurse Communication: Mobility status;Other (comment) (needs zofran for vestibular treatment) PT Visit Diagnosis: Unsteadiness on feet (R26.81);Muscle weakness (generalized) (M62.81)     Time:  [id number] PT Time Calculation (min) (ACUTE ONLY): 25 min  Charges:  $Gait Training: 8-22 mins                      Arby Barrette, PT Pager [phone]    Rexanne Mano 09/29/2020, 12:50 PM

## 2020-09-29 NOTE — Progress Notes (Signed)
Physical Therapy Vestibular Treatment     09/29/20 1255  Vestibular Treatment/Exercise  Vestibular Treatment Provided Canalith Repositioning  Canalith Repositioning Epley Manuever Right   EPLEY MANUEVER RIGHT  Number of Reps  1  Overall Response Symptoms Worsened  Response Details  incr nausea with Epley. Patient to rest ~1 hour and will reassess     09/29/20 1237  PT Time Calculation  PT Start Time (ACUTE ONLY) 1137  PT Stop Time (ACUTE ONLY) 1157  PT Time Calculation (min) (ACUTE ONLY) 20 min  PT General Charges  $$ ACUTE PT VISIT 1 Visit  PT Treatments  $Canalith Rep Proc 8-22 mins    Arby Barrette, PT Pager [phone]

## 2020-09-29 NOTE — Plan of Care (Signed)
  Problem: Education: Goal: Knowledge of General Education information will improve Description: Including pain rating scale, medication(s)/side effects and non-pharmacologic comfort measures Outcome: Progressing   Problem: Activity: Goal: Risk for activity intolerance will decrease Outcome: Progressing   Problem: Nutrition: Goal: Adequate nutrition will be maintained Outcome: Progressing   

## 2020-09-29 NOTE — TOC CAGE-AID Note (Signed)
Transition of Care [facility]) - CAGE-AID Screening   Patient Details  Name: Jeanne Kim MRN: [id number] Date of Birth: October 14, 1940  [facility] CM/SW Contact:    Pollie Friar, RN Phone Number: 09/29/2020, 12:34 PM   Clinical Narrative: Pt denies use of alcohol or drugs.    CAGE-AID Screening: Substance Abuse Screening unable to be completed due to: :  (pt denies use of alcohol or drugs)  Have You Ever Felt You Ought to Cut Down on Your Drinking or Drug Use?: No Have People Annoyed You By Critizing Your Drinking Or Drug Use?: No Have You Felt Bad Or Guilty About Your Drinking Or Drug Use?: No Have You Ever Had a Drink or Used Drugs First Thing In The Morning to Steady Your Nerves or to Get Rid of a Hangover?: No CAGE-AID Score: 0  Substance Abuse Education Offered: No

## 2020-09-30 LAB — T3: T3, Total: 79 ng/dL (ref 71–180)

## 2020-10-04 DIAGNOSIS — E1165 Type 2 diabetes mellitus with hyperglycemia: Secondary | ICD-10-CM | POA: Insufficient documentation

## 2020-10-04 DIAGNOSIS — K529 Noninfective gastroenteritis and colitis, unspecified: Secondary | ICD-10-CM | POA: Insufficient documentation

## 2020-10-04 DIAGNOSIS — M797 Fibromyalgia: Secondary | ICD-10-CM | POA: Insufficient documentation

## 2020-10-04 DIAGNOSIS — K259 Gastric ulcer, unspecified as acute or chronic, without hemorrhage or perforation: Secondary | ICD-10-CM | POA: Insufficient documentation

## 2020-10-04 DIAGNOSIS — R109 Unspecified abdominal pain: Secondary | ICD-10-CM | POA: Insufficient documentation

## 2020-10-04 DIAGNOSIS — Z8719 Personal history of other diseases of the digestive system: Secondary | ICD-10-CM | POA: Insufficient documentation

## 2020-10-04 DIAGNOSIS — K257 Chronic gastric ulcer without hemorrhage or perforation: Secondary | ICD-10-CM | POA: Insufficient documentation

## 2020-10-08 ENCOUNTER — Other Ambulatory Visit: Payer: Self-pay | Admitting: Gastroenterology

## 2020-10-11 ENCOUNTER — Telehealth: Payer: Medicare HMO | Admitting: Gastroenterology

## 2020-10-14 ENCOUNTER — Ambulatory Visit (INDEPENDENT_AMBULATORY_CARE_PROVIDER_SITE_OTHER): Payer: Medicare HMO

## 2020-10-14 DIAGNOSIS — R55 Syncope and collapse: Secondary | ICD-10-CM | POA: Diagnosis not present

## 2020-10-14 DIAGNOSIS — I629 Nontraumatic intracranial hemorrhage, unspecified: Secondary | ICD-10-CM

## 2020-10-17 ENCOUNTER — Ambulatory Visit: Payer: Medicare HMO | Admitting: Gastroenterology

## 2020-10-17 VITALS — BP 122/56 | HR 80 | Ht 64.0 in | Wt 167.0 lb

## 2020-10-17 DIAGNOSIS — K581 Irritable bowel syndrome with constipation: Secondary | ICD-10-CM | POA: Diagnosis not present

## 2020-10-17 DIAGNOSIS — K219 Gastro-esophageal reflux disease without esophagitis: Secondary | ICD-10-CM

## 2020-10-17 DIAGNOSIS — K5904 Chronic idiopathic constipation: Secondary | ICD-10-CM | POA: Diagnosis not present

## 2020-10-17 DIAGNOSIS — K5902 Outlet dysfunction constipation: Secondary | ICD-10-CM

## 2020-10-17 DIAGNOSIS — Z8711 Personal history of peptic ulcer disease: Secondary | ICD-10-CM | POA: Diagnosis not present

## 2020-10-17 LAB — VITAMIN B6: Vitamin B6: 26.1 ug/L (ref 2.0–32.8)

## 2020-10-17 MED ORDER — PANTOPRAZOLE SODIUM 40 MG PO TBEC: 40.0000 mg | DELAYED_RELEASE_TABLET | Freq: Two times a day (BID) | ORAL | 3 refills | Status: DC

## 2020-10-17 MED ORDER — LINACLOTIDE 290 MCG PO CAPS: 290.0000 ug | ORAL_CAPSULE | Freq: Every day | ORAL | 1 refills | Status: DC

## 2020-10-17 NOTE — Patient Instructions (Addendum)
We have sent refills of Protonix twice daily to your pharmacy, refills of Linzess sent to your pharmacy  Increase diet fiber (Fruits and Vegetables)   Increase water intake to 8-10 cups daily  Eat small frequent meals  Due to recent changes in healthcare laws, you may see the results of your imaging and laboratory studies on MyChart before your provider has had a chance to review them.  We understand that in some cases there may be results that are confusing or concerning to you. Not all laboratory results come back in the same time frame and the provider may be waiting for multiple results in order to interpret others.  Please give [place] 48 hours in order for your provider to thoroughly review all the results before contacting the office for clarification of your results.   I appreciate the  opportunity to care for you  Thank You   Harl Bowie , MD

## 2020-10-17 NOTE — Progress Notes (Signed)
Jeanne Kim    [id number]    [birth year]  Primary Care Physician:Hasanaj, Samul Dada, MD  Referring Physician: Neale Burly, MD [address] [place],  [place] [id number]   Chief complaint: GERD, proctitis, chronic constipation  HPI:  80 year old very pleasant female with history of CAD, type 2 diabetes, hypertension, hyperlipidemia, recently hospitalized after syncopal episode, had traumatic subdural hematoma  She underwent extensive evaluation during hospitalization including CT scanning of head, EEG, echocardiogram and was monitored on telemetry.  She had a 30-day event monitor. A.m. cortisol level was low and had electrolyte abnormality with hypokalemia and hypomagnesium  otherwise rest of the work-up was unremarkable  Prior to that she was hospitalized in October 2021 with melena.  EGD August 06, 2020 during hospitalization showed multiple nonbleeding gastric ulcers and LA grade a erosive esophagitis.  Gastric biopsies negative for dysplasia, intestinal metaplasia or malignancy.  Negative for H. pylori bacterial infection.  She continues to have irregular bowel habits with alternating constipation and diarrhea.  She is taking Linzess Denies any rectal bleeding or melena.  Sometimes she has to strain excessively during defecation.  Colonoscopy 02/13/2017 Sigmoid diverticulosis, internal hemorrhoids  colonoscopy in March 2013 by Dr. Sharlett Iles showed diverticulosis and internal hemorrhoids with no polyps  Patient has family history of colon cancer and also personal history of adenomatous polyps  status post hysterectomy, repair of rectocele, cystocele and enterocele,adhesiolysis  Anorectal manometry with findings suggestive of dyssynergy defecation status post pelvic floor physical therapy  Outpatient Encounter Medications as of 10/17/2020  Medication Sig  . acetaminophen (TYLENOL) 500 MG tablet Take 1 tablet (500 mg total) by mouth every 8 (eight) hours as needed  for moderate pain or headache.  Marland Kitchen amLODipine (NORVASC) 5 MG tablet Take 5 mg by mouth daily.  Marland Kitchen aspirin 81 MG chewable tablet Chew 1 tablet (81 mg total) by mouth daily.  Marland Kitchen atorvastatin (LIPITOR) 20 MG tablet Take 1 tablet (20 mg total) by mouth daily.  Marland Kitchen CALCIUM-MAGNESIUM-ZINC PO Take 3 tablets by mouth daily.  . Coenzyme Q10 (CO Q-10) 100 MG CAPS Take 100 mg by mouth daily.  . cyanocobalamin (,VITAMIN B-12,) 1000 MCG/ML injection INJECT 1 ML INTO MUSCLE ONCE A MONTH (Patient taking differently: Inject 1,000 mcg into the muscle every 30 (thirty) days. INJECT 1 ML INTO MUSCLE ONCE A MONTH)  . doxepin (SINEQUAN) 10 MG capsule Take 10 mg by mouth at bedtime.  . DULoxetine (CYMBALTA) 60 MG capsule Take 60 mg by mouth daily.  Marland Kitchen EPINEPHrine 0.3 mg/0.3 mL IJ SOAJ injection Inject 0.3 mg into the muscle as needed for anaphylaxis.  . fexofenadine (ALLEGRA) 30 MG tablet Take 30 mg by mouth daily.  . fluticasone (FLONASE) 50 MCG/ACT nasal spray Place 2 sprays into the nose daily.  . furosemide (LASIX) 40 MG tablet Take 1 tablet (40 mg total) by mouth 3 (three) times a week. Take on Monday/Wednesday/Friday  . gabapentin (NEURONTIN) 300 MG capsule Take 300 mg by mouth at bedtime.  Marland Kitchen gentamicin (GARAMYCIN) 0.3 % ophthalmic solution Place 1 drop into both eyes 3 (three) times daily as needed (red or irritated eyes).  . Insulin Glargine-Lixisenatide 100-33 UNT-MCG/ML SOPN Inject 20-25 Units into the skin daily. Pt adjust between 20-25 units  . levothyroxine (SYNTHROID) 100 MCG tablet Take 1 tablet (100 mcg total) by mouth daily. New order ( not taking yet)  . LINZESS 290 MCG CAPS capsule TAKE 1 CAPSULE BY MOUTH EVERY DAY  .  meclizine (ANTIVERT) 25 MG tablet Take 1 tablet (25 mg total) by mouth 3 (three) times daily as needed for dizziness.  . Melatonin 10 MG TABS Take 10 mg by mouth at bedtime as needed (sleep).  . metFORMIN (GLUCOPHAGE) 1000 MG tablet Take 1,000 mg by mouth 2 (two) times daily with a meal.  .  metolazone (ZAROXOLYN) 5 MG tablet Take 2.5 mg by mouth daily.  . metoprolol succinate (TOPROL-XL) 50 MG 24 hr tablet TAKE 1 TABLET BY MOUTH EVERY DAY (Patient taking differently: Take 50 mg by mouth daily.)  . montelukast (SINGULAIR) 10 MG tablet Take 10 mg by mouth at bedtime.  . Needles & Syringes MISC 25G 5/8 inch needle on 3 ml syringe-Use once monthly for B12 injections  . nitroGLYCERIN (NITROSTAT) 0.4 MG SL tablet PLACE 1 TABLET (0.4 MG TOTAL) UNDER THE TONGUE EVERY 5 (FIVE) MINUTES AS NEEDED FOR CHEST PAIN.  Marland Kitchen Omega-3 Fatty Acids (FISH OIL) 1000 MG CAPS Take 1 capsule by mouth daily.  . ondansetron (ZOFRAN) 4 MG tablet Take 1 tablet (4 mg total) by mouth every 8 (eight) hours as needed for nausea or vomiting.  Glory Rosebush VERIO test strip 1 each by Other route as directed.   . pantoprazole (PROTONIX) 40 MG tablet Take 1 tablet (40 mg total) by mouth 2 (two) times daily.  . potassium chloride (KLOR-CON) 10 MEQ tablet Take 2 tablets (20 mEq total) by mouth 3 (three) times a week.  . ramipril (ALTACE) 5 MG capsule Take 1 capsule (5 mg total) by mouth at bedtime.   No facility-administered encounter medications on file as of 10/17/2020.    Allergies as of 10/17/2020 - Review Complete 10/17/2020  Allergen Reaction Noted  . Codeine Hives 04/27/2009  . Sulfonamide derivatives Hives 04/27/2009    Past Medical History:  Diagnosis Date  . Allergy   . Anemia   . Anginal pain ([place])   . Anxiety   . Arthritis   . Blood transfusion without reported diagnosis   . Breast cancer ([place])   . Carotid artery plaque    bilat  . Cataract   . Colon polyps    adenomatous  . Concussion 02/2016   Driving restrictions for 6w  . Coronary heart disease   . Depression   . Diverticulosis   . Dizziness   . Esophageal reflux   . Esophageal stricture   . Fibromyalgia   . Gallstones   . Hiatal hernia   . Hypercholesterolemia   . Hypertension   . Hypothyroidism   . IBS (irritable bowel syndrome)   .  Kidney stones   . Osteoporosis   . Pneumonia    recently, just finished ABO  . Pneumonia   . PONV (postoperative nausea and vomiting)   . Sleep apnea    CPAP  . Thyroid disease 2002  . Type II or unspecified type diabetes mellitus without mention of complication, not stated as uncontrolled   . Unspecified gastritis and gastroduodenitis without mention of hemorrhage     Past Surgical History:  Procedure Laterality Date  . ANAL RECTAL MANOMETRY N/A 08/05/2015   Procedure: ANO RECTAL MANOMETRY;  Surgeon: Mauri Pole, MD;  Location: [facility];  Service: Endoscopy;  Laterality: N/A;  . APPENDECTOMY    . BIOPSY  08/06/2020   Procedure: BIOPSY;  Surgeon: Eloise Harman, DO;  Location: [facility];  Service: Endoscopy;;  . BREAST LUMPECTOMY     left  . CARDIAC CATHETERIZATION    . CATARACT EXTRACTION  W/PHACO Left 03/30/2013   Procedure: CATARACT EXTRACTION PHACO AND INTRAOCULAR LENS PLACEMENT (IOC);  Surgeon: Tonny Branch, MD;  Location: [facility];  Service: Ophthalmology;  Laterality: Left;  CDE:19.28  . CATARACT EXTRACTION W/PHACO Right 04/09/2013   Procedure: CATARACT EXTRACTION PHACO AND INTRAOCULAR LENS PLACEMENT (IOC);  Surgeon: Tonny Branch, MD;  Location: [facility];  Service: Ophthalmology;  Laterality: Right;  CDE: 15.44  . CERVICAL FUSION    . CESAREAN SECTION    . CHOLECYSTECTOMY    . CYSTOCELE REPAIR    . DILATION AND CURETTAGE OF UTERUS    . ESOPHAGOGASTRODUODENOSCOPY (EGD) WITH PROPOFOL N/A 08/06/2020   Procedure: ESOPHAGOGASTRODUODENOSCOPY (EGD) WITH PROPOFOL;  Surgeon: Eloise Harman, DO;  Location: [facility];  Service: Endoscopy;  Laterality: N/A;  . EYE SURGERY Left 03-30-13   Cataract  . EYE SURGERY Right 04-09-13   Cataract  . RECTOCELE REPAIR    . THYROIDECTOMY    . TONSILLECTOMY    . TOTAL ABDOMINAL HYSTERECTOMY      Family History  Problem Relation Age of Onset  . Sarcoidosis Mother   . Diabetes Mother   . Cancer Mother        sarcoma -  Right arm  amputation  . Hypertension Mother   . Lung cancer Father   . Diabetes Father   . Heart disease Father        Before age 13  . Urolithiasis Father   . Cancer Father        Lung  . Hyperlipidemia Father   . Hypertension Father   . Esophageal cancer Maternal Uncle   . Ovarian cancer Maternal Aunt   . Colon cancer Maternal Uncle   . Lung cancer Maternal Uncle   . Diabetes Maternal Uncle   . Diabetes Maternal Aunt   . Colon polyps Maternal Uncle   . Heart disease Paternal Grandmother     Social History   Socioeconomic History  . Marital status: Married    Spouse name: Not on file  . Number of children: 4  . Years of education: Not on file  . Highest education level: Not on file  Occupational History  . Occupation: Retired Optician, dispensing: RETIRED  Tobacco Use  . Smoking status: Never Smoker  . Smokeless tobacco: Never Used  Vaping Use  . Vaping Use: Never used  Substance and Sexual Activity  . Alcohol use: No  . Drug use: No  . Sexual activity: Not on file  Other Topics Concern  . Not on file  Social History Narrative  . Not on file   Social Determinants of Health   Financial Resource Strain: Not on file  Food Insecurity: Not on file  Transportation Needs: Not on file  Physical Activity: Not on file  Stress: Not on file  Social Connections: Not on file  Intimate Partner Violence: Not on file      Review of systems: All other review of systems negative except as mentioned in the HPI.   Physical Exam: Vitals:   10/17/20 1030  BP: (!) 122/56  Pulse: 80   Body mass index is 28.67 kg/m. Gen:      No acute distress HEENT:  sclera anicteric Abd:      soft, non-tender; no palpable masses, no distension Ext:    No edema Neuro: alert and oriented x 3 Psych: normal mood and affect  Data Reviewed:  Reviewed labs, radiology imaging, old records and pertinent past GI work up   Assessment  and Plan/Recommendations:  80 year old very pleasant female  with history of cystocele, enterocele and rectocele status post repair, chronic idiopathic constipation secondary to outlet dysfunction and chronic irritable bowel syndrome  Chronic idiopathic constipation, pelvic floor outlet dysfunction with dyssynergic defecation Continue Linzess 290 mcg daily Increase dietary fiber and water intake  GERD and multiple gastric ulcers: Secondary to NSAIDs Continue to avoid NSAIDs Protonix 40 mg twice daily Antireflux measures  Return in 3 to 4 months or sooner if needed   The patient was provided an opportunity to ask questions and all were answered. The patient agreed with the plan and demonstrated an understanding of the instructions.  Damaris Hippo , MD    CC: Neale Burly, MD

## 2020-10-24 ENCOUNTER — Other Ambulatory Visit: Payer: Self-pay | Admitting: Cardiovascular Disease

## 2020-10-30 ENCOUNTER — Other Ambulatory Visit: Payer: Self-pay

## 2020-10-30 ENCOUNTER — Observation Stay (HOSPITAL_COMMUNITY)
Admission: EM | Admit: 2020-10-30 | Discharge: 2020-10-31 | Disposition: A | Discharge status: Home / Self Care | Payer: Medicare HMO | Attending: Family Medicine | Admitting: Family Medicine

## 2020-10-30 ENCOUNTER — Encounter (HOSPITAL_COMMUNITY): Payer: Self-pay

## 2020-10-30 DIAGNOSIS — E871 Hypo-osmolality and hyponatremia: Secondary | ICD-10-CM

## 2020-10-30 DIAGNOSIS — D649 Anemia, unspecified: Secondary | ICD-10-CM | POA: Diagnosis not present

## 2020-10-30 DIAGNOSIS — Z794 Long term (current) use of insulin: Secondary | ICD-10-CM | POA: Diagnosis not present

## 2020-10-30 DIAGNOSIS — E876 Hypokalemia: Secondary | ICD-10-CM | POA: Diagnosis not present

## 2020-10-30 DIAGNOSIS — Z7982 Long term (current) use of aspirin: Secondary | ICD-10-CM | POA: Diagnosis not present

## 2020-10-30 DIAGNOSIS — R17 Unspecified jaundice: Secondary | ICD-10-CM | POA: Diagnosis not present

## 2020-10-30 DIAGNOSIS — I629 Nontraumatic intracranial hemorrhage, unspecified: Secondary | ICD-10-CM | POA: Diagnosis not present

## 2020-10-30 DIAGNOSIS — K219 Gastro-esophageal reflux disease without esophagitis: Secondary | ICD-10-CM | POA: Diagnosis present

## 2020-10-30 DIAGNOSIS — I10 Essential (primary) hypertension: Secondary | ICD-10-CM | POA: Diagnosis present

## 2020-10-30 DIAGNOSIS — E1165 Type 2 diabetes mellitus with hyperglycemia: Secondary | ICD-10-CM | POA: Diagnosis present

## 2020-10-30 DIAGNOSIS — I2510 Atherosclerotic heart disease of native coronary artery without angina pectoris: Secondary | ICD-10-CM | POA: Diagnosis present

## 2020-10-30 DIAGNOSIS — E039 Hypothyroidism, unspecified: Secondary | ICD-10-CM | POA: Diagnosis present

## 2020-10-30 DIAGNOSIS — Z853 Personal history of malignant neoplasm of breast: Secondary | ICD-10-CM | POA: Diagnosis not present

## 2020-10-30 DIAGNOSIS — N179 Acute kidney failure, unspecified: Secondary | ICD-10-CM | POA: Diagnosis not present

## 2020-10-30 DIAGNOSIS — Z20822 Contact with and (suspected) exposure to covid-19: Secondary | ICD-10-CM | POA: Insufficient documentation

## 2020-10-30 DIAGNOSIS — R112 Nausea with vomiting, unspecified: Secondary | ICD-10-CM

## 2020-10-30 DIAGNOSIS — Z79899 Other long term (current) drug therapy: Secondary | ICD-10-CM | POA: Diagnosis not present

## 2020-10-30 DIAGNOSIS — D75839 Thrombocytosis, unspecified: Secondary | ICD-10-CM | POA: Diagnosis not present

## 2020-10-30 DIAGNOSIS — D509 Iron deficiency anemia, unspecified: Secondary | ICD-10-CM

## 2020-10-30 LAB — COMPREHENSIVE METABOLIC PANEL
ALT: 14 U/L (ref 0–44)
AST: 21 U/L (ref 15–41)
Albumin: 4.2 g/dL (ref 3.5–5.0)
Alkaline Phosphatase: 58 U/L (ref 38–126)
Anion gap: 14 (ref 5–15)
BUN: 17 mg/dL (ref 8–23)
CO2: 28 mmol/L (ref 22–32)
Calcium: 9 mg/dL (ref 8.9–10.3)
Chloride: 89 mmol/L — ABNORMAL LOW (ref 98–111)
Creatinine, Ser: 0.85 mg/dL (ref 0.44–1.00)
GFR, Estimated: >60 mL/min (ref >60)
Glucose, Bld: 235 mg/dL — ABNORMAL HIGH (ref 70–99)
Potassium: 2.8 mmol/L — ABNORMAL LOW (ref 3.5–5.1)
Sodium: 131 mmol/L — ABNORMAL LOW (ref 135–145)
Total Bilirubin: 2.1 mg/dL — ABNORMAL HIGH (ref 0.3–1.2)
Total Protein: 8.1 g/dL (ref 6.5–8.1)

## 2020-10-30 LAB — CBC
HCT: 38.6 % (ref 36.0–46.0)
Hemoglobin: 11.1 g/dL — ABNORMAL LOW (ref 12.0–15.0)
MCH: 21.3 pg — ABNORMAL LOW (ref 26.0–34.0)
MCHC: 28.8 g/dL — ABNORMAL LOW (ref 30.0–36.0)
MCV: 74.2 fL — ABNORMAL LOW (ref 80.0–100.0)
Platelets: 464 10*3/uL — ABNORMAL HIGH (ref 150–400)
RBC: 5.2 MIL/uL — ABNORMAL HIGH (ref 3.87–5.11)
RDW: 18.5 % — ABNORMAL HIGH (ref 11.5–15.5)
WBC: 12 10*3/uL — ABNORMAL HIGH (ref 4.0–10.5)
nRBC: 0 % (ref 0.0–0.2)

## 2020-10-30 LAB — LIPASE, BLOOD: Lipase: 15 U/L (ref 11–51)

## 2020-10-30 MED ORDER — LACTATED RINGERS IV BOLUS
500.0000 mL | Freq: Once | INTRAVENOUS | Status: AC
  Administered 2020-10-31: 500 mL via INTRAVENOUS

## 2020-10-30 MED ORDER — ONDANSETRON HCL 4 MG/2ML IJ SOLN
4.0000 mg | Freq: Once | INTRAMUSCULAR | Status: AC
  Administered 2020-10-31: 4 mg via INTRAVENOUS
  Filled 2020-10-30: qty 2

## 2020-10-30 MED ORDER — POTASSIUM CHLORIDE CRYS ER 20 MEQ PO TBCR
40.0000 meq | EXTENDED_RELEASE_TABLET | Freq: Once | ORAL | Status: AC
  Administered 2020-10-31: 40 meq via ORAL
  Filled 2020-10-30: qty 2

## 2020-10-30 MED ORDER — POTASSIUM CHLORIDE 10 MEQ/100ML IV SOLN
10.0000 meq | INTRAVENOUS | Status: AC
  Administered 2020-10-31 (×2): 10 meq via INTRAVENOUS
  Filled 2020-10-30 (×2): qty 100

## 2020-10-30 NOTE — ED Triage Notes (Signed)
Pt to er, pt states that she is here for nausea and vomiting, states that she has been sick for the past week.  Pt states that she has nausea, vomiting, and body aches. States that she was vaccinated for covid.

## 2020-10-30 NOTE — ED Provider Notes (Signed)
[facility] Provider Note   CSN: [id number] Arrival date & time: 10/30/20  1739   History Chief Complaint  Patient presents with  . Emesis    Jeanne Kim is a 80 y.o. female.  The history is provided by the patient.  Emesis She has history of hypertension, diabetes, hyperlipidemia, coronary artery disease and comes in because she says her family just wanted her checked out.  She said her blood sugars at home have been running high and have been as high as 400.  She does admit to vomiting yesterday and today but states that she is no longer having any nausea.  She denies any diarrhea.  There has been some mild abdominal soreness.  She denies any sick contacts.  She denies any fever or chills.  Past Medical History:  Diagnosis Date  . Allergy   . Anemia   . Anginal pain ([place])   . Anxiety   . Arthritis   . Blood transfusion without reported diagnosis   . Breast cancer ([place])   . Carotid artery plaque    bilat  . Cataract   . Colon polyps    adenomatous  . Concussion 02/2016   Driving restrictions for 6w  . Coronary heart disease   . Depression   . Diverticulosis   . Dizziness   . Esophageal reflux   . Esophageal stricture   . Fibromyalgia   . Gallstones   . Hiatal hernia   . Hypercholesterolemia   . Hypertension   . Hypothyroidism   . IBS (irritable bowel syndrome)   . Kidney stones   . Osteoporosis   . Pneumonia    recently, just finished ABO  . Pneumonia   . PONV (postoperative nausea and vomiting)   . Sleep apnea    CPAP  . Thyroid disease 2002  . Type II or unspecified type diabetes mellitus without mention of complication, not stated as uncontrolled   . Unspecified gastritis and gastroduodenitis without mention of hemorrhage     Patient Active Problem List   Diagnosis Date Noted  . Intracranial bleed ([place]) 09/28/2020  . Leukocytosis 09/28/2020  . AKI (acute kidney injury) ([place]) 09/28/2020  . Upper GI bleed 08/05/2020  . Fecal  incontinence   . Swelling of limb-Bilateral leg 04/29/2014  . Weakness of both legs 04/29/2014  . Dizziness and giddiness 04/29/2014  . Malignant neoplasm of breast ([place]) 07/02/2013  . Occlusion and stenosis of carotid artery without mention of cerebral infarction 04/28/2013  . Carotid stenosis 01/20/2013  . Autonomic orthostatic hypotension 10/17/2012  . Dyspnea 08/27/2012  . Syncope 02/17/2012  . Head trauma 02/14/2012  . Diaphoresis 02/05/2012  . Unspecified constipation 12/12/2011  . Personal history of colonic polyps 12/12/2011  . Family history of malignant neoplasm of gastrointestinal tract 12/12/2011  . Diverticulosis of colon 12/12/2011  . Gastritis 12/12/2011  . GERD with stricture 12/12/2011  . Esophageal reflux 11/27/2011  . Constipation 11/27/2011  . IBS (irritable bowel syndrome) 11/27/2011  . Bacterial overgrowth syndrome 11/27/2011  . DM (diabetes mellitus) ([place]) 11/27/2011  . S/P cholecystectomy 11/27/2011  . Adult hypothyroidism 07/13/2011  . Type 2 diabetes mellitus ([place]) 07/13/2011  . ADENOCARCINOMA, LEFT BREAST 12/09/2009  . CHEST PAIN-PRECORDIAL 12/09/2009  . UNSPECIFIED HYPOTHYROIDISM 04/27/2009  . DIABETES MELLITUS 04/07/2009  . HYPERCHOLESTEROLEMIA  IIA 04/07/2009  . OVERWEIGHT/OBESITY 04/07/2009  . Obstructive sleep apnea 04/07/2009  . Essential hypertension 04/07/2009  . Coronary atherosclerosis 04/07/2009  . IRRITABLE BOWEL SYNDROME 04/07/2009  . DEGENERATIVE JOINT  DISEASE 04/07/2009  . DEGENERATIVE DISC DISEASE 04/07/2009  . FIBROMYALGIA 04/07/2009  . Thyroid nodule 05/04/2008    Past Surgical History:  Procedure Laterality Date  . ANAL RECTAL MANOMETRY N/A 08/05/2015   Procedure: ANO RECTAL MANOMETRY;  Surgeon: Mauri Pole, MD;  Location: [facility];  Service: Endoscopy;  Laterality: N/A;  . APPENDECTOMY    . BIOPSY  08/06/2020   Procedure: BIOPSY;  Surgeon: Eloise Harman, DO;  Location: [facility];  Service: Endoscopy;;   . BREAST LUMPECTOMY     left  . CARDIAC CATHETERIZATION    . CATARACT EXTRACTION W/PHACO Left 03/30/2013   Procedure: CATARACT EXTRACTION PHACO AND INTRAOCULAR LENS PLACEMENT (IOC);  Surgeon: Tonny Branch, MD;  Location: [facility] OR[facility];  Service: Ophthalmology;  Laterality: Left;  CDE:19.28  . CATARACT EXTRACTION W/PHACO Right 04/09/2013   Procedure: CATARACT EXTRACTION PHACO AND INTRAOCULAR LENS PLACEMENT (IOC);  Surgeon: Tonny Branch, MD;  Location: [facility];  Service: Ophthalmology;  Laterality: Right;  CDE: 15.44  . CERVICAL FUSION    . CESAREAN SECTION    . CHOLECYSTECTOMY    . CYSTOCELE REPAIR    . DILATION AND CURETTAGE OF UTERUS    . ESOPHAGOGASTRODUODENOSCOPY (EGD) WITH PROPOFOL N/A 08/06/2020   Procedure: ESOPHAGOGASTRODUODENOSCOPY (EGD) WITH PROPOFOL;  Surgeon: Eloise Harman, DO;  Location: [facility];  Service: Endoscopy;  Laterality: N/A;  . EYE SURGERY Left 03-30-13   Cataract  . EYE SURGERY Right 04-09-13   Cataract  . RECTOCELE REPAIR    . THYROIDECTOMY    . TONSILLECTOMY    . TOTAL ABDOMINAL HYSTERECTOMY       OB History   No obstetric history on file.     Family History  Problem Relation Age of Onset  . Sarcoidosis Mother   . Diabetes Mother   . Cancer Mother        sarcoma -  Right arm amputation  . Hypertension Mother   . Lung cancer Father   . Diabetes Father   . Heart disease Father        Before age 25  . Urolithiasis Father   . Cancer Father        Lung  . Hyperlipidemia Father   . Hypertension Father   . Esophageal cancer Maternal Uncle   . Ovarian cancer Maternal Aunt   . Colon cancer Maternal Uncle   . Lung cancer Maternal Uncle   . Diabetes Maternal Uncle   . Diabetes Maternal Aunt   . Colon polyps Maternal Uncle   . Heart disease Paternal Grandmother     Social History   Tobacco Use  . Smoking status: Never Smoker  . Smokeless tobacco: Never Used  Vaping Use  . Vaping Use: Never used  Substance Use Topics  . Alcohol use: No  . Drug  use: No    Home Medications Prior to Admission medications   Medication Sig Start Date End Date Taking? Authorizing Provider  acetaminophen (TYLENOL) 500 MG tablet Take 1 tablet (500 mg total) by mouth every 8 (eight) hours as needed for moderate pain or headache. 09/29/20 09/29/21  Thurnell Lose, MD  amLODipine (NORVASC) 5 MG tablet Take 5 mg by mouth daily. 09/08/20   [provider]  aspirin 81 MG chewable tablet Chew 1 tablet (81 mg total) by mouth daily. 10/13/20   Thurnell Lose, MD  atorvastatin (LIPITOR) 20 MG tablet Take 1 tablet (20 mg total) by mouth daily. 10/17/12   Hillary Bow, MD  CALCIUM-MAGNESIUM-ZINC PO Take 3 tablets by mouth daily.    [provider]  Coenzyme Q10 (CO Q-10) 100 MG CAPS Take 100 mg by mouth daily.    [provider]  cyanocobalamin (,VITAMIN B-12,) 1000 MCG/ML injection INJECT 1 ML INTO MUSCLE ONCE A MONTH Patient taking differently: Inject 1,000 mcg into the muscle every 30 (thirty) days. INJECT 1 ML INTO MUSCLE ONCE A MONTH 04/27/19   Mauri Pole, MD  doxepin (SINEQUAN) 10 MG capsule Take 10 mg by mouth at bedtime. 02/16/19   [provider]  DULoxetine (CYMBALTA) 60 MG capsule Take 60 mg by mouth daily.    [provider]  EPINEPHrine 0.3 mg/0.3 mL IJ SOAJ injection Inject 0.3 mg into the muscle as needed for anaphylaxis. 07/18/09   [provider]  fexofenadine (ALLEGRA) 30 MG tablet Take 30 mg by mouth daily.    [provider]  fluticasone (FLONASE) 50 MCG/ACT nasal spray Place 2 sprays into the nose daily.    [provider]  furosemide (LASIX) 40 MG tablet Take 1 tablet (40 mg total) by mouth 3 (three) times a week. Take on Monday/Wednesday/Friday 07/06/20   Richardson Dopp T, PA-C  gabapentin (NEURONTIN) 300 MG capsule Take 300 mg by mouth at bedtime.    [provider]  gentamicin (GARAMYCIN) 0.3 % ophthalmic solution Place 1 drop into both eyes 3 (three)  times daily as needed (red or irritated eyes). 06/16/20   [provider]  Insulin Glargine-Lixisenatide 100-33 UNT-MCG/ML SOPN Inject 20-25 Units into the skin daily. Pt adjust between 20-25 units    [provider]  KLOR-CON M10 10 MEQ tablet TAKE 1 TABLET BY MOUTH DAILY FOR 360 DOSES. 10/24/20   Sherren Mocha, MD  levothyroxine (SYNTHROID) 100 MCG tablet Take 1 tablet (100 mcg total) by mouth daily. New order ( not taking yet) 09/29/20   Thurnell Lose, MD  linaclotide [facility] 290 MCG CAPS capsule Take 1 capsule (290 mcg total) by mouth daily. 10/17/20   Mauri Pole, MD  meclizine (ANTIVERT) 25 MG tablet Take 1 tablet (25 mg total) by mouth 3 (three) times daily as needed for dizziness. 09/29/20   Thurnell Lose, MD  Melatonin 10 MG TABS Take 10 mg by mouth at bedtime as needed (sleep).    [provider]  metFORMIN (GLUCOPHAGE) 1000 MG tablet Take 1,000 mg by mouth 2 (two) times daily with a meal.    [provider]  metolazone (ZAROXOLYN) 5 MG tablet Take 2.5 mg by mouth daily.    [provider]  metoprolol succinate (TOPROL-XL) 50 MG 24 hr tablet TAKE 1 TABLET BY MOUTH EVERY DAY Patient taking differently: Take 50 mg by mouth daily. 07/08/20   Sherren Mocha, MD  montelukast (SINGULAIR) 10 MG tablet Take 10 mg by mouth at bedtime.    [provider]  Needles & Syringes MISC 25G 5/8 inch needle on 3 ml syringe-Use once monthly for B12 injections 07/20/15   Nandigam, Karleen Hampshire V, MD  nitroGLYCERIN (NITROSTAT) 0.4 MG SL tablet PLACE 1 TABLET (0.4 MG TOTAL) UNDER THE TONGUE EVERY 5 (FIVE) MINUTES AS NEEDED FOR CHEST PAIN. 12/03/19   Sherren Mocha, MD  Omega-3 Fatty Acids (FISH OIL) 1000 MG CAPS Take 1 capsule by mouth daily. 07/18/09   [provider]  ondansetron (ZOFRAN) 4 MG tablet Take 1 tablet (4 mg total) by mouth every 8 (eight) hours as needed for nausea or vomiting. 09/29/20   Thurnell Lose, MD  ONETOUCH VERIO  test strip 1 each by Other route as directed.  04/18/16   [provider]  pantoprazole (PROTONIX) 40 MG tablet Take 1 tablet (40 mg total) by mouth 2 (two) times daily. 08/07/20   Mariel Aloe, MD  pantoprazole (PROTONIX) 40 MG tablet Take 1 tablet (40 mg total) by mouth 2 (two) times daily. 10/17/20   Mauri Pole, MD  ramipril (ALTACE) 5 MG capsule Take 1 capsule (5 mg total) by mouth at bedtime. 01/11/16   Sherren Mocha, MD    Allergies    Codeine and Sulfonamide derivatives  Review of Systems   Review of Systems  Gastrointestinal: Positive for vomiting.  All other systems reviewed and are negative.   Physical Exam Updated Vital Signs BP 132/86   Pulse (!) 115   Temp 98.3 F (36.8 C) (Oral)   Resp 16   Ht 5' 4.5" (1.638 m)   Wt 72.6 kg   SpO2 93%   BMI 27.04 kg/m   Physical Exam Vitals and nursing note reviewed.   80 year old female, resting comfortably and in no acute distress. Vital signs are significant for mildly elevated heart rate. Oxygen saturation is 93%, which is normal. Head is normocephalic and atraumatic. PERRLA, EOMI. Oropharynx is clear. Neck is nontender and supple without adenopathy or JVD. Back is nontender and there is no CVA tenderness. Lungs are clear without rales, wheezes, or rhonchi. Chest is nontender. Heart has regular rate and rhythm without murmur. Abdomen is soft, flat, nontender without masses or hepatosplenomegaly and peristalsis is slightly hypoactive. Extremities have no cyanosis or edema, full range of motion is present. Skin is warm and dry without rash. Neurologic: Mental status is normal, cranial nerves are intact, there are no motor or sensory deficits.  ED Results / Procedures / Treatments   Labs (all labs ordered are listed, but only abnormal results are displayed) Labs Reviewed  COMPREHENSIVE METABOLIC PANEL - Abnormal; Notable for the following components:      Result Value   Sodium 131 (*)    Potassium  2.8 (*)    Chloride 89 (*)    Glucose, Bld 235 (*)    Total Bilirubin 2.1 (*)    All other components within normal limits  CBC - Abnormal; Notable for the following components:   WBC 12.0 (*)    RBC 5.20 (*)    Hemoglobin 11.1 (*)    MCV 74.2 (*)    MCH 21.3 (*)    MCHC 28.8 (*)    RDW 18.5 (*)    Platelets 464 (*)    All other components within normal limits  MAGNESIUM - Abnormal; Notable for the following components:   Magnesium 0.8 (*)    All other components within normal limits  SARS CORONAVIRUS 2 (TAT 6-24 HRS)  LIPASE, BLOOD  URINALYSIS, ROUTINE W REFLEX MICROSCOPIC  PHOSPHORUS    EKG EKG Interpretation  Date/Time:  Sunday October 30 2020 22:23:15 EST Ventricular Rate:  116 PR Interval:    QRS Duration: 94 QT Interval:  339 QTC Calculation: 471 R Axis:   16 Text Interpretation: Sinus tachycardia Low voltage, extremity leads Borderline repolarization abnormality When compared with ECG of 09/27/2020, HEART RATE has increased No STEMI Reconfirmed by Delora Fuel ([id number]) on 10/30/2020 11:40:20 PM   Radiology No results found.  Procedures Procedures  CRITICAL CARE Performed by: Delora Fuel Total critical care time: 35 minutes Critical care time was exclusive of separately billable procedures and treating other patients. Critical  care was necessary to treat or prevent imminent or life-threatening deterioration. Critical care was time spent personally by me on the following activities: development of treatment plan with patient and/or surrogate as well as nursing, discussions with consultants, evaluation of patient's response to treatment, examination of patient, obtaining history from patient or surrogate, ordering and performing treatments and interventions, ordering and review of laboratory studies, ordering and review of radiographic studies, pulse oximetry and re-evaluation of patient's condition.  Medications Ordered in ED Medications  ondansetron (ZOFRAN)  injection 4 mg (has no administration in time range)  lactated ringers bolus 500 mL (has no administration in time range)  potassium chloride SA (KLOR-CON) CR tablet 40 mEq (has no administration in time range)  potassium chloride 10 mEq in 100 mL IVPB (has no administration in time range)    ED Course  I have reviewed the triage vital signs and the nursing notes.  Pertinent labs & imaging results that were available during my care of the patient were reviewed by me and considered in my medical decision making (see chart for details).  MDM Rules/Calculators/A&P Nausea and vomiting which have resolved.  Labs show moderate to s IV and oral potassium evere hypokalemia as well as mild hyponatremia.  Bilirubin is also noted to be mildly elevated and has been elevated in the past.  In the absence of elevated transaminases, doubt that this is significance, question Gilbert's disease.  Mild anemia is present, improved over baseline.  Mild thrombocytosis noted which is about at her baseline, and not clinically significant.  Electrolyte abnormality probably secondary to a combination of vomiting and diuretic use.  However, patient states that she has not taken furosemide for several years even though it has been listed on her medications consistently including discharge medication list from several recent hospitalizations.  She will be given IV fluids, as well as IV and oral potassium.  Will check magnesium level.  Magnesium has come back extremely low at 0.8.  Intravenous magnesium replacement is initiated.  Patient's nurse has reported several episodes of transient hypoxia with O2 saturation down to 84%, patient placed on nasal oxygen.  Because of hypoxia is not clear, chest x-ray is ordered.  Given hypoxia, severe hypomagnesemia and moderate to severe hypokalemia, I feel she needs to be admitted for proper replacement of electrolytes and observation.  Case is discussed with Dr. Olevia Bowens of [facility],  who agrees to admit the patient.  Final Clinical Impression(s) / ED Diagnoses Final diagnoses:  Hypokalemia  Hypomagnesemia  Non-intractable vomiting with nausea, unspecified vomiting type  Hyponatremia  Microcytic anemia  Serum total bilirubin elevated    Rx / DC Orders ED Discharge Orders    None       Delora Fuel, MD [id number] [phone]

## 2020-10-31 ENCOUNTER — Other Ambulatory Visit: Payer: Self-pay | Admitting: Family Medicine

## 2020-10-31 ENCOUNTER — Encounter (HOSPITAL_COMMUNITY): Payer: Self-pay

## 2020-10-31 ENCOUNTER — Emergency Department (HOSPITAL_COMMUNITY): Payer: Medicare HMO

## 2020-10-31 DIAGNOSIS — I2583 Coronary atherosclerosis due to lipid rich plaque: Secondary | ICD-10-CM

## 2020-10-31 DIAGNOSIS — D75839 Thrombocytosis, unspecified: Secondary | ICD-10-CM | POA: Diagnosis present

## 2020-10-31 DIAGNOSIS — E039 Hypothyroidism, unspecified: Secondary | ICD-10-CM | POA: Diagnosis not present

## 2020-10-31 DIAGNOSIS — I2510 Atherosclerotic heart disease of native coronary artery without angina pectoris: Secondary | ICD-10-CM | POA: Diagnosis not present

## 2020-10-31 DIAGNOSIS — I10 Essential (primary) hypertension: Secondary | ICD-10-CM | POA: Diagnosis not present

## 2020-10-31 DIAGNOSIS — E1165 Type 2 diabetes mellitus with hyperglycemia: Secondary | ICD-10-CM | POA: Diagnosis present

## 2020-10-31 DIAGNOSIS — E876 Hypokalemia: Secondary | ICD-10-CM | POA: Diagnosis present

## 2020-10-31 DIAGNOSIS — D509 Iron deficiency anemia, unspecified: Secondary | ICD-10-CM

## 2020-10-31 LAB — COMPREHENSIVE METABOLIC PANEL
ALT: 13 U/L (ref 0–44)
AST: 17 U/L (ref 15–41)
Albumin: 3.3 g/dL — ABNORMAL LOW (ref 3.5–5.0)
Alkaline Phosphatase: 47 U/L (ref 38–126)
Anion gap: 13 (ref 5–15)
BUN: 16 mg/dL (ref 8–23)
CO2: 28 mmol/L (ref 22–32)
Calcium: 8.2 mg/dL — ABNORMAL LOW (ref 8.9–10.3)
Chloride: 90 mmol/L — ABNORMAL LOW (ref 98–111)
Creatinine, Ser: 0.78 mg/dL (ref 0.44–1.00)
GFR, Estimated: >60 mL/min (ref >60)
Glucose, Bld: 245 mg/dL — ABNORMAL HIGH (ref 70–99)
Potassium: 2.7 mmol/L — CL (ref 3.5–5.1)
Sodium: 131 mmol/L — ABNORMAL LOW (ref 135–145)
Total Bilirubin: 2.2 mg/dL — ABNORMAL HIGH (ref 0.3–1.2)
Total Protein: 6.3 g/dL — ABNORMAL LOW (ref 6.5–8.1)

## 2020-10-31 LAB — CBC
HCT: 32.4 % — ABNORMAL LOW (ref 36.0–46.0)
Hemoglobin: 9.2 g/dL — ABNORMAL LOW (ref 12.0–15.0)
MCH: 21.4 pg — ABNORMAL LOW (ref 26.0–34.0)
MCHC: 28.4 g/dL — ABNORMAL LOW (ref 30.0–36.0)
MCV: 75.5 fL — ABNORMAL LOW (ref 80.0–100.0)
Platelets: 361 10*3/uL (ref 150–400)
RBC: 4.29 MIL/uL (ref 3.87–5.11)
RDW: 18 % — ABNORMAL HIGH (ref 11.5–15.5)
WBC: 9.7 10*3/uL (ref 4.0–10.5)
nRBC: 0 % (ref 0.0–0.2)

## 2020-10-31 LAB — PHOSPHORUS: Phosphorus: 3.5 mg/dL (ref 2.5–4.6)

## 2020-10-31 LAB — MAGNESIUM
Magnesium: 0.8 mg/dL — CL (ref 1.7–2.4)
Magnesium: 1.3 mg/dL — ABNORMAL LOW (ref 1.7–2.4)

## 2020-10-31 LAB — SARS CORONAVIRUS 2 (TAT 6-24 HRS): SARS Coronavirus 2: NEGATIVE

## 2020-10-31 MED ORDER — MAGNESIUM SULFATE 2 GM/50ML IV SOLN
2.0000 g | Freq: Once | INTRAVENOUS | Status: AC
  Administered 2020-10-31: 2 g via INTRAVENOUS
  Filled 2020-10-31: qty 50

## 2020-10-31 MED ORDER — POTASSIUM CHLORIDE ER 10 MEQ PO TBCR: 10.0000 meq | EXTENDED_RELEASE_TABLET | Freq: Every day | ORAL | 0 refills | Status: DC

## 2020-10-31 MED ORDER — MAGNESIUM SULFATE 2 GM/50ML IV SOLN: 2.0000 g | Freq: Once | INTRAVENOUS | Status: DC

## 2020-10-31 MED ORDER — POTASSIUM CHLORIDE CRYS ER 20 MEQ PO TBCR
60.0000 meq | EXTENDED_RELEASE_TABLET | Freq: Once | ORAL | Status: AC
  Administered 2020-10-31: 60 meq via ORAL
  Filled 2020-10-31: qty 3

## 2020-10-31 MED ORDER — POTASSIUM CHLORIDE ER 10 MEQ PO TBCR
10.0000 meq | EXTENDED_RELEASE_TABLET | Freq: Every day | ORAL | 0 refills | Status: DC
Start: 2020-10-31 — End: 2020-12-24

## 2020-10-31 MED ORDER — POTASSIUM CHLORIDE CRYS ER 10 MEQ PO TBCR: 10.0000 meq | EXTENDED_RELEASE_TABLET | Freq: Every day | ORAL | 0 refills | Status: DC

## 2020-10-31 MED ORDER — MAGNESIUM OXIDE 250 MG PO TABS: 250.0000 mg | ORAL_TABLET | Freq: Every day | ORAL | 0 refills | Status: DC

## 2020-10-31 NOTE — ED Notes (Signed)
Pt took self to bathroom. UA not obtained. Pt wanting to leave AMA. RN made aware

## 2020-10-31 NOTE — ED Notes (Signed)
Sats down to 85% while pt at rest in bed, O2@2lnc  applied.

## 2020-10-31 NOTE — Discharge Instructions (Signed)
Hypokalemia Hypokalemia means that the amount of potassium in the blood is lower than normal. Potassium is a chemical (electrolyte) that helps regulate the amount of fluid in the body. It also stimulates muscle tightening (contraction) and helps nerves work properly. Normally, most of the body's potassium is inside cells, and only a very small amount is in the blood. Because the amount in the blood is so small, minor changes to potassium levels in the blood can be life-threatening. What are the causes? This condition may be caused by:  Antibiotic medicine.  Diarrhea or vomiting. Taking too much of a medicine that helps you have a bowel movement (laxative) can cause diarrhea and lead to hypokalemia.  Chronic kidney disease (CKD).  Medicines that help the body get rid of excess fluid (diuretics).  Eating disorders, such as bulimia.  Low magnesium levels in the body.  Sweating a lot. What are the signs or symptoms? Symptoms of this condition include:  Weakness.  Constipation.  Fatigue.  Muscle cramps.  Mental confusion.  Skipped heartbeats or irregular heartbeat (palpitations).  Tingling or numbness. How is this diagnosed? This condition is diagnosed with a blood test. How is this treated? This condition may be treated by:  Taking potassium supplements by mouth.  Adjusting the medicines that you take.  Eating more foods that contain a lot of potassium. If your potassium level is very low, you may need to get potassium through an IV and be monitored in the hospital. Follow these instructions at home:  Take over-the-counter and prescription medicines only as told by your health care provider. This includes vitamins and supplements.  Eat a healthy diet. A healthy diet includes fresh fruits and vegetables, whole grains, healthy fats, and lean proteins.  If instructed, eat more foods that contain a lot of potassium. This includes: ? Nuts, such as peanuts and  pistachios. ? Seeds, such as sunflower seeds and pumpkin seeds. ? Peas, lentils, and lima beans. ? Whole grain and bran cereals and breads. ? Fresh fruits and vegetables, such as apricots, avocado, bananas, cantaloupe, kiwi, oranges, tomatoes, asparagus, and potatoes. ? Orange juice. ? Tomato juice. ? Red meats. ? Yogurt.  Keep all follow-up visits as told by your health care provider. This is important.   Contact a health care provider if you:  Have weakness that gets worse.  Feel your heart pounding or racing.  Vomit.  Have diarrhea.  Have diabetes (diabetes mellitus) and you have trouble keeping your blood sugar (glucose) in your target range. Get help right away if you:  Have chest pain.  Have shortness of breath.  Have vomiting or diarrhea that lasts for more than 2 days.  Faint. Summary  Hypokalemia means that the amount of potassium in the blood is lower than normal.  This condition is diagnosed with a blood test.  Hypokalemia may be treated by taking potassium supplements, adjusting the medicines that you take, or eating more foods that are high in potassium.  If your potassium level is very low, you may need to get potassium through an IV and be monitored in the hospital. This information is not intended to replace advice given to you by your health care provider. Make sure you discuss any questions you have with your health care provider. Document Revised: 05/07/2018 Document Reviewed: 05/07/2018 Elsevier Patient Education  2021 [employer]   Hypomagnesemia Hypomagnesemia is a condition in which the level of magnesium in the blood is low. Magnesium is a mineral that is found in many  foods. It is used in many different processes in the body. Hypomagnesemia can affect every organ in the body. In severe cases, it can cause life-threatening problems. What are the causes? This condition may be caused by:  Not getting enough magnesium in your  diet.  Malnutrition.  Problems with absorbing magnesium from the intestines.  Dehydration.  Alcohol abuse.  Vomiting.  Severe or chronic diarrhea.  Some medicines, including medicines that make you urinate more (diuretics).  Certain diseases, such as kidney disease, diabetes, celiac disease, and overactive thyroid. What are the signs or symptoms? Symptoms of this condition include:  Loss of appetite.  Nausea and vomiting.  Involuntary shaking or trembling of a body part (tremor).  Muscle weakness.  Tingling in the arms and legs.  Sudden tightening of muscles (muscle spasms).  Confusion.  Psychiatric issues, such as depression, irritability, or psychosis.  A feeling of fluttering of the heart.  Seizures. These symptoms are more severe if magnesium levels drop suddenly. How is this diagnosed? This condition may be diagnosed based on:  Your symptoms and medical history.  A physical exam.  Blood and urine tests. How is this treated? Treatment depends on the cause and the severity of the condition. It may be treated with:  A magnesium supplement. This can be taken in pill form. If the condition is severe, magnesium is usually given through an IV.  Changes to your diet. You may be directed to eat foods that have a lot of magnesium, such as green leafy vegetables, peas, beans, and nuts.  Stopping any intake of alcohol.   Follow these instructions at home:  Make sure that your diet includes foods with magnesium. Foods that have a lot of magnesium in them include: ? Green leafy vegetables, such as spinach and broccoli. ? Beans and peas. ? Nuts and seeds, such as almonds and sunflower seeds. ? Whole grains, such as whole grain bread and fortified cereals.  Take magnesium supplements if your health care provider tells you to do that. Take them as directed.  Take over-the-counter and prescription medicines only as told by your health care provider.  Have your  magnesium levels monitored as told by your health care provider.  When you are active, drink fluids that contain electrolytes.  Avoid drinking alcohol.  Keep all follow-up visits as told by your health care provider. This is important.      Contact a health care provider if:  You get worse instead of better.  Your symptoms return. Get help right away if you:  Develop severe muscle weakness.  Have trouble breathing.  Feel that your heart is racing. Summary  Hypomagnesemia is a condition in which the level of magnesium in the blood is low.  Hypomagnesemia can affect every organ in the body.  Treatment may include eating more foods that contain magnesium, taking magnesium supplements, and not drinking alcohol.  Have your magnesium levels monitored as told by your health care provider. This information is not intended to replace advice given to you by your health care provider. Make sure you discuss any questions you have with your health care provider. Document Revised: 02/25/2020 Document Reviewed: 02/25/2020 Elsevier Patient Education  2021 [place].   IMPORTANT INFORMATION: PAY CLOSE ATTENTION   PHYSICIAN DISCHARGE INSTRUCTIONS  Follow with Primary care provider  Neale Burly, MD  and other consultants as instructed by your Hospitalist Physician  [place] IF SYMPTOMS COME BACK, WORSEN OR NEW PROBLEM DEVELOPS  Please note: You were cared for by a hospitalist during your hospital stay. Every effort will be made to forward records to your primary care provider.  You can request that your primary care provider send for your hospital records if they have not received them.  Once you are discharged, your primary care physician will handle any further medical issues. Please note that NO REFILLS for any discharge medications will be authorized once you are discharged, as it is imperative that you return to your primary care physician  (or establish a relationship with a primary care physician if you do not have one) for your post hospital discharge needs so that they can reassess your need for medications and monitor your lab values.  Please get a complete blood count and chemistry panel checked by your Primary MD at your next visit, and again as instructed by your Primary MD.  Get Medicines reviewed and adjusted: Please take all your medications with you for your next visit with your Primary MD  Laboratory/radiological data: Please request your Primary MD to go over all hospital tests and procedure/radiological results at the follow up, please ask your primary care provider to get all Hospital records sent to his/her office.  In some cases, they will be blood work, cultures and biopsy results pending at the time of your discharge. Please request that your primary care provider follow up on these results.  If you are diabetic, please bring your blood sugar readings with you to your follow up appointment with primary care.    Please call and make your follow up appointments as soon as possible.    Also Note the following: If you experience worsening of your admission symptoms, develop shortness of breath, life threatening emergency, suicidal or homicidal thoughts you must seek medical attention immediately by calling 911 or calling your MD immediately  if symptoms less severe.  You must read complete instructions/literature along with all the possible adverse reactions/side effects for all the Medicines you take and that have been prescribed to you. Take any new Medicines after you have completely understood and accpet all the possible adverse reactions/side effects.   Do not drive when taking Pain medications or sleeping medications (Benzodiazepines)  Do not take more than prescribed Pain, Sleep and Anxiety Medications. It is not advisable to combine anxiety,sleep and pain medications without talking with your primary care  practitioner  Special Instructions: If you have smoked or chewed Tobacco  in the last 2 yrs please stop smoking, stop any regular Alcohol  and or any Recreational drug use.  Wear Seat belts while driving.  Do not drive if taking any narcotic, mind altering or controlled substances or recreational drugs or alcohol.

## 2020-10-31 NOTE — Discharge Summary (Signed)
Physician Discharge Summary  Jeanne Kim [id number] DOB: 1941-04-08 DOA: 10/30/2020  PCP: Neale Burly, MD  Admit date: 10/30/2020 Discharge date: 10/31/2020  Admitted From:  Home  Disposition:  Home   Recommendations for Outpatient Follow-up:  1. Follow up with PCP in 1 weeks 2. Please obtain BMP and Magnesium in 1-2 weeks   Discharge Condition: STABLE   CODE STATUS: FULL    Brief Hospitalization Summary: Please see all hospital notes, images, labs for full details of the hospitalization. ADMISSION HPI:  80 y.o. female with medical history significant of seasonal allergies, unspecified anemia, anxiety, depression, osteoarthritis, history of blood transfusion, breast cancer, bilateral carotid artery plaque, coronary heart disease, hypertension, hyperlipidemia, cataracts, colon polyps, history of concussion, GERD/hiatal hernia, esophageal stricture, history of gastroduodenitis, gallstones, diverticulosis, IBS, fibromyalgia, hypothyroidism, would lithiasis, osteoporosis, history of pneumonia, sleep apnea on CPAP, type 2 diabetes who is coming to emergency department due to multiple episodes of nausea and emesis since earlier in the week associated with body aches, but denies abdominal pain, diarrhea, constipation, melena or hematochezia.  No fever, chills, rhinorrhea, sore throat, dyspnea, wheezing or hemoptysis.  She denies travel history or sick contacts.  She thinks she may have eaten something that caused the symptoms, but she is not sure what that could have been.  She denies chest pain, palpitations, dizziness, diaphoresis, PND, orthopnea or pitting edema of the lower extremities.  No dysuria, frequency or hematuria.  No polyuria, polydipsia, polyphagia or blurred vision..  ED Course: Initial vital signs were temperature 98.6 F, pulse 121, respiration 18, BP 121/82 mmHg and O2 sat 100% on room air.  The patient received a 500 mL LR bolus, ondansetron 4 mg IVP x1 dose, KCl 40 mEq  p.o. x1, KCl 10 mEq IVPB x2 and magnesium sulfate 2 g IVPB.  Labwork: CBC showed a white count of 12.0, hemoglobin 11.1 g/dL with an MCV of 74.2 fL and platelets 464.  CMP shows sodium 131, potassium 2.8, chloride 89 and CO2 28 mmol/L.  Anion gap was 14.  Glucose 235 and total bilirubin 2.1 mg/dL.  The rest of the hepatic functions are within expected range.  Lipase was normal.  Magnesium was 0.8 and phosphorus 3.5 mg/dL.  Imaging: A one-view portable chest radiograph showed shallow inspiration with atelectasis in the lung bases.  Please see image and full radiology report for further detail.  Hospital Course: Pt was admitted for observation.  Pt was treated with potassium IV and oral and IV magnesium 4gm was given.  She reported that she was feeling better after IV fluids and electrolytes and was ready to go home. She says she will leave this morning and she is not going to stay longer.  I sent Rx for supplemental potassium and magnesium to her pharmacy and I have asked that she see her PCP in next week to have her electrolytes retested. She verbalized understanding.  She says she is feeling much better. She is discharged to home in stable condition.  She requested that I not give her medical information to family members who have been calling.    Discharge Diagnoses:  Principal Problem:   Hypokalemia due to excessive gastrointestinal loss of potassium Active Problems:   Benign essential HTN   CAD (coronary artery disease)   Gastroesophageal reflux disease   Adult hypothyroidism   Microcytic anemia   Hypomagnesemia   Hyperbilirubinemia   Thrombocytosis   Type 2 diabetes mellitus with hyperglycemia [facility])  Discharge Instructions:  Allergies as of 10/31/2020  Reactions   Codeine Hives   Sulfonamide Derivatives Hives      Medication List    STOP taking these medications   Klor-Con M10 10 MEQ tablet Generic drug: potassium chloride     TAKE these medications   acetaminophen 500  MG tablet Commonly known as: TYLENOL Take 1 tablet (500 mg total) by mouth every 8 (eight) hours as needed for moderate pain or headache.   amLODipine 5 MG tablet Commonly known as: NORVASC Take 5 mg by mouth daily.   aspirin 81 MG chewable tablet Chew 1 tablet (81 mg total) by mouth daily.   atorvastatin 20 MG tablet Commonly known as: LIPITOR Take 1 tablet (20 mg total) by mouth daily.   CALCIUM-MAGNESIUM-ZINC PO Take 3 tablets by mouth daily.   Co Q-10 100 MG Caps Take 100 mg by mouth daily.   cyanocobalamin 1000 MCG/ML injection Commonly known as: (VITAMIN B-12) INJECT 1 ML INTO MUSCLE ONCE A MONTH What changed: See the new instructions.   doxepin 10 MG capsule Commonly known as: SINEQUAN Take 10 mg by mouth at bedtime.   DULoxetine 60 MG capsule Commonly known as: CYMBALTA Take 60 mg by mouth daily.   EPINEPHrine 0.3 mg/0.3 mL Soaj injection Commonly known as: EPI-PEN Inject 0.3 mg into the muscle as needed for anaphylaxis.   fexofenadine 30 MG tablet Commonly known as: ALLEGRA Take 30 mg by mouth daily.   Fish Oil 1000 MG Caps Take 1 capsule by mouth daily.   fluticasone 50 MCG/ACT nasal spray Commonly known as: FLONASE Place 2 sprays into the nose daily.   furosemide 40 MG tablet Commonly known as: LASIX Take 1 tablet (40 mg total) by mouth 3 (three) times a week. Take on Monday/Wednesday/Friday   gabapentin 300 MG capsule Commonly known as: NEURONTIN Take 300 mg by mouth at bedtime.   gentamicin 0.3 % ophthalmic solution Commonly known as: GARAMYCIN Place 1 drop into both eyes 3 (three) times daily as needed (red or irritated eyes).   Insulin Glargine-Lixisenatide 100-33 UNT-MCG/ML Sopn Inject 20-25 Units into the skin daily. Pt adjust between 20-25 units   linaclotide 290 MCG Caps capsule Commonly known as: Linzess Take 1 capsule (290 mcg total) by mouth daily.   Magnesium Oxide 250 MG Tabs Take 1 tablet (250 mg total) by mouth daily.    meclizine 25 MG tablet Commonly known as: ANTIVERT Take 1 tablet (25 mg total) by mouth 3 (three) times daily as needed for dizziness.   Melatonin 10 MG Tabs Take 10 mg by mouth at bedtime as needed (sleep).   metFORMIN 1000 MG tablet Commonly known as: GLUCOPHAGE Take 1,000 mg by mouth 2 (two) times daily with a meal.   metolazone 5 MG tablet Commonly known as: ZAROXOLYN Take 2.5 mg by mouth daily.   metoprolol succinate 50 MG 24 hr tablet Commonly known as: TOPROL-XL TAKE 1 TABLET BY MOUTH EVERY DAY   montelukast 10 MG tablet Commonly known as: SINGULAIR Take 10 mg by mouth at bedtime.   Needles & Syringes Misc 25G 5/8 inch needle on 3 ml syringe-Use once monthly for B12 injections   nitroGLYCERIN 0.4 MG SL tablet Commonly known as: NITROSTAT PLACE 1 TABLET (0.4 MG TOTAL) UNDER THE TONGUE EVERY 5 (FIVE) MINUTES AS NEEDED FOR CHEST PAIN.   ondansetron 4 MG tablet Commonly known as: Zofran Take 1 tablet (4 mg total) by mouth every 8 (eight) hours as needed for nausea or vomiting.   OneTouch Verio test strip Generic drug:  glucose blood 1 each by Other route as directed.   pantoprazole 40 MG tablet Commonly known as: PROTONIX Take 1 tablet (40 mg total) by mouth 2 (two) times daily.   pantoprazole 40 MG tablet Commonly known as: Protonix Take 1 tablet (40 mg total) by mouth 2 (two) times daily.   ramipril 5 MG capsule Commonly known as: ALTACE Take 1 capsule (5 mg total) by mouth at bedtime.   Synthroid 137 MCG tablet Generic drug: levothyroxine Take 137 mcg by mouth daily.       Follow-up Information    Neale Burly, MD. Schedule an appointment as soon as possible for a visit in 1 week(s).   Specialty: Internal Medicine Why: Hospital Follow Up  Contact information: [address] [place] [id number] [phone]        Sherren Mocha, MD .   Specialty: Cardiology Contact information: [id number] [place]  [address] [phone]              Allergies  Allergen Reactions  . Codeine Hives  . Sulfonamide Derivatives Hives   Allergies as of 10/31/2020      Reactions   Codeine Hives   Sulfonamide Derivatives Hives      Medication List    STOP taking these medications   Klor-Con M10 10 MEQ tablet Generic drug: potassium chloride     TAKE these medications   acetaminophen 500 MG tablet Commonly known as: TYLENOL Take 1 tablet (500 mg total) by mouth every 8 (eight) hours as needed for moderate pain or headache.   amLODipine 5 MG tablet Commonly known as: NORVASC Take 5 mg by mouth daily.   aspirin 81 MG chewable tablet Chew 1 tablet (81 mg total) by mouth daily.   atorvastatin 20 MG tablet Commonly known as: LIPITOR Take 1 tablet (20 mg total) by mouth daily.   CALCIUM-MAGNESIUM-ZINC PO Take 3 tablets by mouth daily.   Co Q-10 100 MG Caps Take 100 mg by mouth daily.   cyanocobalamin 1000 MCG/ML injection Commonly known as: (VITAMIN B-12) INJECT 1 ML INTO MUSCLE ONCE A MONTH What changed: See the new instructions.   doxepin 10 MG capsule Commonly known as: SINEQUAN Take 10 mg by mouth at bedtime.   DULoxetine 60 MG capsule Commonly known as: CYMBALTA Take 60 mg by mouth daily.   EPINEPHrine 0.3 mg/0.3 mL Soaj injection Commonly known as: EPI-PEN Inject 0.3 mg into the muscle as needed for anaphylaxis.   fexofenadine 30 MG tablet Commonly known as: ALLEGRA Take 30 mg by mouth daily.   Fish Oil 1000 MG Caps Take 1 capsule by mouth daily.   fluticasone 50 MCG/ACT nasal spray Commonly known as: FLONASE Place 2 sprays into the nose daily.   furosemide 40 MG tablet Commonly known as: LASIX Take 1 tablet (40 mg total) by mouth 3 (three) times a week. Take on Monday/Wednesday/Friday   gabapentin 300 MG capsule Commonly known as: NEURONTIN Take 300 mg by mouth at bedtime.   gentamicin 0.3 % ophthalmic solution Commonly known as: GARAMYCIN Place 1 drop  into both eyes 3 (three) times daily as needed (red or irritated eyes).   Insulin Glargine-Lixisenatide 100-33 UNT-MCG/ML Sopn Inject 20-25 Units into the skin daily. Pt adjust between 20-25 units   linaclotide 290 MCG Caps capsule Commonly known as: Linzess Take 1 capsule (290 mcg total) by mouth daily.   Magnesium Oxide 250 MG Tabs Take 1 tablet (250 mg total) by mouth daily.  meclizine 25 MG tablet Commonly known as: ANTIVERT Take 1 tablet (25 mg total) by mouth 3 (three) times daily as needed for dizziness.   Melatonin 10 MG Tabs Take 10 mg by mouth at bedtime as needed (sleep).   metFORMIN 1000 MG tablet Commonly known as: GLUCOPHAGE Take 1,000 mg by mouth 2 (two) times daily with a meal.   metolazone 5 MG tablet Commonly known as: ZAROXOLYN Take 2.5 mg by mouth daily.   metoprolol succinate 50 MG 24 hr tablet Commonly known as: TOPROL-XL TAKE 1 TABLET BY MOUTH EVERY DAY   montelukast 10 MG tablet Commonly known as: SINGULAIR Take 10 mg by mouth at bedtime.   Needles & Syringes Misc 25G 5/8 inch needle on 3 ml syringe-Use once monthly for B12 injections   nitroGLYCERIN 0.4 MG SL tablet Commonly known as: NITROSTAT PLACE 1 TABLET (0.4 MG TOTAL) UNDER THE TONGUE EVERY 5 (FIVE) MINUTES AS NEEDED FOR CHEST PAIN.   ondansetron 4 MG tablet Commonly known as: Zofran Take 1 tablet (4 mg total) by mouth every 8 (eight) hours as needed for nausea or vomiting.   OneTouch Verio test strip Generic drug: glucose blood 1 each by Other route as directed.   pantoprazole 40 MG tablet Commonly known as: PROTONIX Take 1 tablet (40 mg total) by mouth 2 (two) times daily.   pantoprazole 40 MG tablet Commonly known as: Protonix Take 1 tablet (40 mg total) by mouth 2 (two) times daily.   ramipril 5 MG capsule Commonly known as: ALTACE Take 1 capsule (5 mg total) by mouth at bedtime.   Synthroid 137 MCG tablet Generic drug: levothyroxine Take 137 mcg by mouth daily.        Procedures/Studies: DG Chest Port 1 View  Result Date: 10/31/2020 CLINICAL DATA:  Chest pain, shortness of breath, and weakness. EXAM: PORTABLE CHEST 1 VIEW COMPARISON:  10/04/2020 FINDINGS: Shallow inspiration with atelectasis in the lung bases. Heart size and pulmonary vascularity are normal for technique. Esophageal hiatal hernia behind the heart. No pleural effusions. No pneumothorax. Mediastinal contours appear intact. Degenerative changes in the spine and shoulders. Postoperative changes in the cervical spine. IMPRESSION: Shallow inspiration with atelectasis in the lung bases. Electronically Signed   By: Lucienne Capers M.D.   On: 10/31/2020 02:44      Subjective: Pt decided she is feeling better and she is going home this morning.  Not willing to stay longer.   No longer having any nausea or vomiting.   Discharge Exam: Vitals:   10/31/20 0630 10/31/20 0912  BP: (!) 152/78 138/72  Pulse:  91  Resp: 15   Temp:  98 F (36.7 C)  SpO2:  97%   Vitals:   10/31/20 0530 10/31/20 0600 10/31/20 0630 10/31/20 0912  BP: 138/72 136/80 (!) 152/78 138/72  Pulse: 88 (!) 106  91  Resp: 17 19 15    Temp:    98 F (36.7 C)  TempSrc:    Oral  SpO2: 98% 97%  97%  Weight:      Height:       General: Pt is alert, awake, not in acute distress Cardiovascular: normal S1/S2 +, no rubs, no gallops Respiratory: CTA bilaterally, no wheezing, no rhonchi Abdominal: Soft, NT, ND, bowel sounds + Extremities: no edema, no cyanosis   The results of significant diagnostics from this hospitalization (including imaging, microbiology, ancillary and laboratory) are listed below for reference.     Microbiology: No results found for this or any previous visit (from  the past 240 hour(s)).   Labs: BNP (last 3 results) Recent Labs    08/05/20 1806  BNP 47.8   Basic Metabolic Panel: Recent Labs  Lab 10/30/20 2053 10/31/20 0037 10/31/20 0752  NA 131*  --  131*  K 2.8*  --  2.7*  CL 89*  --   90*  CO2 28  --  28  GLUCOSE 235*  --  245*  BUN 17  --  16  CREATININE 0.85  --  0.78  CALCIUM 9.0  --  8.2*  MG  --  0.8* 1.3*  PHOS  --  3.5  --    Liver Function Tests: Recent Labs  Lab 10/30/20 2053 10/31/20 0752  AST 21 17  ALT 14 13  ALKPHOS 58 47  BILITOT 2.1* 2.2*  PROT 8.1 6.3*  ALBUMIN 4.2 3.3*   Recent Labs  Lab 10/30/20 2053  LIPASE 15   No results for input(s): AMMONIA in the last 168 hours. CBC: Recent Labs  Lab 10/30/20 2053 10/31/20 0752  WBC 12.0* 9.7  HGB 11.1* 9.2*  HCT 38.6 32.4*  MCV 74.2* 75.5*  PLT 464* 361   Cardiac Enzymes: No results for input(s): CKTOTAL, CKMB, CKMBINDEX, TROPONINI in the last 168 hours. BNP: Invalid input(s): POCBNP CBG: No results for input(s): GLUCAP in the last 168 hours. D-Dimer No results for input(s): DDIMER in the last 72 hours. Hgb A1c No results for input(s): HGBA1C in the last 72 hours. Lipid Profile No results for input(s): CHOL, HDL, LDLCALC, TRIG, CHOLHDL, LDLDIRECT in the last 72 hours. Thyroid function studies No results for input(s): TSH, T4TOTAL, T3FREE, THYROIDAB in the last 72 hours.  Invalid input(s): FREET3 Anemia work up No results for input(s): VITAMINB12, FOLATE, FERRITIN, TIBC, IRON, RETICCTPCT in the last 72 hours. Urinalysis    Component Value Date/Time   COLORURINE YELLOW 09/28/2020 [place] 09/28/2020 0427   LABSPEC 1.019 09/28/2020 0427   PHURINE 5.0 09/28/2020 0427   GLUCOSEU NEGATIVE 09/28/2020 0427   HGBUR SMALL (A) 09/28/2020 0427   BILIRUBINUR NEGATIVE 09/28/2020 0427   KETONESUR NEGATIVE 09/28/2020 0427   PROTEINUR NEGATIVE 09/28/2020 0427   NITRITE NEGATIVE 09/28/2020 0427   LEUKOCYTESUR NEGATIVE 09/28/2020 0427   Sepsis Labs Invalid input(s): PROCALCITONIN,  WBC,  LACTICIDVEN Microbiology No results found for this or any previous visit (from the past 240 hour(s)).  Time coordinating discharge:   SIGNED:  Irwin Brakeman, MD  [facility] 10/31/2020, 9:35 AM How to contact the [facility] Attending or Consulting provider [place] or covering provider during after hours [place], for this patient?  1. Check the care team in [facility] and look for a) attending/consulting [facility] provider listed and b) the [facility] team listed 2. Log into [email or web address] and use [place]'s universal password to access. If you do not have the password, please contact the hospital operator. 3. Locate the [facility] provider you are looking for under [facility] and page to a number that you can be directly reached. 4. If you still have difficulty reaching the provider, please page the [facility] (Director on Call) for the Hospitalists listed on amion for assistance.

## 2020-10-31 NOTE — ED Notes (Signed)
Family requesting information about patient care plan:  (Daughter-in-law)  Alyxandra Tenbrink [phone]

## 2020-10-31 NOTE — Progress Notes (Signed)
10/31/2020 9:21 AM  Pt says not to give any of her medical information to anyone. She says that she prefers to communicate her information herself as she deems necessary.  Will respect patient's wishes.   Pt says she is going home this morning.  Murvin Natal MD

## 2020-10-31 NOTE — ED Notes (Signed)
Date and time results received: 10/31/20 0150  Test: Magnesium Critical Value: 0.8  Name of Provider Notified: Dr. Roxanne Mins

## 2020-10-31 NOTE — H&P (Signed)
History and Physical    Jeanne Kim [id number] DOB: 10/31/1940 DOA: 10/30/2020  PCP: Neale Burly, MD  Patient coming from: Home.  I have personally briefly reviewed patient's old medical records in [place]  Chief Complaint: Nausea and vomiting.  HPI: Jeanne Kim is a 80 y.o. female with medical history significant of seasonal allergies, unspecified anemia, anxiety, depression, osteoarthritis, history of blood transfusion, breast cancer, bilateral carotid artery plaque, coronary heart disease, hypertension, hyperlipidemia, cataracts, colon polyps, history of concussion, GERD/hiatal hernia, esophageal stricture, history of gastroduodenitis, gallstones, diverticulosis, IBS, fibromyalgia, hypothyroidism, would lithiasis, osteoporosis, history of pneumonia, sleep apnea on CPAP, type 2 diabetes who is coming to emergency department due to multiple episodes of nausea and emesis since earlier in the week associated with body aches, but denies abdominal pain, diarrhea, constipation, melena or hematochezia.  No fever, chills, rhinorrhea, sore throat, dyspnea, wheezing or hemoptysis.  She denies travel history or sick contacts.  She thinks she may have eaten something that caused the symptoms, but she is not sure what that could have been.  She denies chest pain, palpitations, dizziness, diaphoresis, PND, orthopnea or pitting edema of the lower extremities.  No dysuria, frequency or hematuria.  No polyuria, polydipsia, polyphagia or blurred vision..  ED Course: Initial vital signs were temperature 98.6 F, pulse 121, respiration 18, BP 121/82 mmHg and O2 sat 100% on room air.  The patient received a 500 mL LR bolus, ondansetron 4 mg IVP x1 dose, KCl 40 mEq p.o. x1, KCl 10 mEq IVPB x2 and magnesium sulfate 2 g IVPB.  Labwork: CBC showed a white count of 12.0, hemoglobin 11.1 g/dL with an MCV of 74.2 fL and platelets 464.  CMP shows sodium 131, potassium 2.8, chloride 89 and CO2 28 mmol/L.   Anion gap was 14.  Glucose 235 and total bilirubin 2.1 mg/dL.  The rest of the hepatic functions are within expected range.  Lipase was normal.  Magnesium was 0.8 and phosphorus 3.5 mg/dL.  Imaging: A one-view portable chest radiograph showed shallow inspiration with atelectasis in the lung bases.  Please see image and full radiology report for further detail.  Review of Systems: As per HPI otherwise all other systems reviewed and are negative.  Past Medical History:  Diagnosis Date  . Allergy   . Anemia   . Anginal pain ([place])   . Anxiety   . Arthritis   . Blood transfusion without reported diagnosis   . Breast cancer ([place])   . Carotid artery plaque    bilat  . Cataract   . Colon polyps    adenomatous  . Concussion 02/2016   Driving restrictions for 6w  . Coronary heart disease   . Depression   . Diverticulosis   . Dizziness   . Esophageal reflux   . Esophageal stricture   . Fibromyalgia   . Gallstones   . Hiatal hernia   . Hypercholesterolemia   . Hypertension   . Hypothyroidism   . IBS (irritable bowel syndrome)   . Kidney stones   . Osteoporosis   . Pneumonia    recently, just finished ABO  . Pneumonia   . PONV (postoperative nausea and vomiting)   . Sleep apnea    CPAP  . Thyroid disease 2002  . Type II or unspecified type diabetes mellitus without mention of complication, not stated as uncontrolled   . Unspecified gastritis and gastroduodenitis without mention of hemorrhage     Past Surgical History:  Procedure  Laterality Date  . ANAL RECTAL MANOMETRY N/A 08/05/2015   Procedure: ANO RECTAL MANOMETRY;  Surgeon: Mauri Pole, MD;  Location: [facility];  Service: Endoscopy;  Laterality: N/A;  . APPENDECTOMY    . BIOPSY  08/06/2020   Procedure: BIOPSY;  Surgeon: Eloise Harman, DO;  Location: [facility];  Service: Endoscopy;;  . BREAST LUMPECTOMY     left  . CARDIAC CATHETERIZATION    . CATARACT EXTRACTION W/PHACO Left 03/30/2013   Procedure:  CATARACT EXTRACTION PHACO AND INTRAOCULAR LENS PLACEMENT (IOC);  Surgeon: Tonny Branch, MD;  Location: [facility];  Service: Ophthalmology;  Laterality: Left;  CDE:19.28  . CATARACT EXTRACTION W/PHACO Right 04/09/2013   Procedure: CATARACT EXTRACTION PHACO AND INTRAOCULAR LENS PLACEMENT (IOC);  Surgeon: Tonny Branch, MD;  Location: [facility];  Service: Ophthalmology;  Laterality: Right;  CDE: 15.44  . CERVICAL FUSION    . CESAREAN SECTION    . CHOLECYSTECTOMY    . CYSTOCELE REPAIR    . DILATION AND CURETTAGE OF UTERUS    . ESOPHAGOGASTRODUODENOSCOPY (EGD) WITH PROPOFOL N/A 08/06/2020   Procedure: ESOPHAGOGASTRODUODENOSCOPY (EGD) WITH PROPOFOL;  Surgeon: Eloise Harman, DO;  Location[facility];  Service: Endoscopy;  Laterality: N/A;  . EYE SURGERY Left 03-30-13   Cataract  . EYE SURGERY Right 04-09-13   Cataract  . RECTOCELE REPAIR    . THYROIDECTOMY    . TONSILLECTOMY    . TOTAL ABDOMINAL HYSTERECTOMY      Social History  reports that she has never smoked. She has never used smokeless tobacco. She reports that she does not drink alcohol and does not use drugs.  Allergies  Allergen Reactions  . Codeine Hives  . Sulfonamide Derivatives Hives    Family History  Problem Relation Age of Onset  . Sarcoidosis Mother   . Diabetes Mother   . Cancer Mother        sarcoma -  Right arm amputation  . Hypertension Mother   . Lung cancer Father   . Diabetes Father   . Heart disease Father        Before age 68  . Urolithiasis Father   . Cancer Father        Lung  . Hyperlipidemia Father   . Hypertension Father   . Esophageal cancer Maternal Uncle   . Ovarian cancer Maternal Aunt   . Colon cancer Maternal Uncle   . Lung cancer Maternal Uncle   . Diabetes Maternal Uncle   . Diabetes Maternal Aunt   . Colon polyps Maternal Uncle   . Heart disease Paternal Grandmother    Prior to Admission medications   Medication Sig Start Date End Date Taking? Authorizing Provider  acetaminophen  (TYLENOL) 500 MG tablet Take 1 tablet (500 mg total) by mouth every 8 (eight) hours as needed for moderate pain or headache. 09/29/20 09/29/21  Thurnell Lose, MD  amLODipine (NORVASC) 5 MG tablet Take 5 mg by mouth daily. 09/08/20   [provider]  aspirin 81 MG chewable tablet Chew 1 tablet (81 mg total) by mouth daily. 10/13/20   Thurnell Lose, MD  atorvastatin (LIPITOR) 20 MG tablet Take 1 tablet (20 mg total) by mouth daily. 10/17/12   Hillary Bow, MD  CALCIUM-MAGNESIUM-ZINC PO Take 3 tablets by mouth daily.    [provider]  Coenzyme Q10 (CO Q-10) 100 MG CAPS Take 100 mg by mouth daily.    [provider]  cyanocobalamin (,VITAMIN B-12,) 1000 MCG/ML injection  INJECT 1 ML INTO MUSCLE ONCE A MONTH Patient taking differently: Inject 1,000 mcg into the muscle every 30 (thirty) days. INJECT 1 ML INTO MUSCLE ONCE A MONTH 04/27/19   Mauri Pole, MD  doxepin (SINEQUAN) 10 MG capsule Take 10 mg by mouth at bedtime. 02/16/19   [provider]  DULoxetine (CYMBALTA) 60 MG capsule Take 60 mg by mouth daily.    [provider]  EPINEPHrine 0.3 mg/0.3 mL IJ SOAJ injection Inject 0.3 mg into the muscle as needed for anaphylaxis. 07/18/09   [provider]  fexofenadine (ALLEGRA) 30 MG tablet Take 30 mg by mouth daily.    [provider]  fluticasone (FLONASE) 50 MCG/ACT nasal spray Place 2 sprays into the nose daily.    [provider]  furosemide (LASIX) 40 MG tablet Take 1 tablet (40 mg total) by mouth 3 (three) times a week. Take on Monday/Wednesday/Friday 07/06/20   Richardson Dopp T, PA-C  gabapentin (NEURONTIN) 300 MG capsule Take 300 mg by mouth at bedtime.    [provider]  gentamicin (GARAMYCIN) 0.3 % ophthalmic solution Place 1 drop into both eyes 3 (three) times daily as needed (red or irritated eyes). 06/16/20   [provider]  Insulin Glargine-Lixisenatide 100-33 UNT-MCG/ML SOPN Inject 20-25  Units into the skin daily. Pt adjust between 20-25 units    [provider]  KLOR-CON M10 10 MEQ tablet TAKE 1 TABLET BY MOUTH DAILY FOR 360 DOSES. 10/24/20   Sherren Mocha, MD  linaclotide [facility]) 290 MCG CAPS capsule Take 1 capsule (290 mcg total) by mouth daily. 10/17/20   Mauri Pole, MD  meclizine (ANTIVERT) 25 MG tablet Take 1 tablet (25 mg total) by mouth 3 (three) times daily as needed for dizziness. 09/29/20   Thurnell Lose, MD  Melatonin 10 MG TABS Take 10 mg by mouth at bedtime as needed (sleep).    [provider]  metFORMIN (GLUCOPHAGE) 1000 MG tablet Take 1,000 mg by mouth 2 (two) times daily with a meal.    [provider]  metolazone (ZAROXOLYN) 5 MG tablet Take 2.5 mg by mouth daily.    [provider]  metoprolol succinate (TOPROL-XL) 50 MG 24 hr tablet TAKE 1 TABLET BY MOUTH EVERY DAY Patient taking differently: Take 50 mg by mouth daily. 07/08/20   Sherren Mocha, MD  montelukast (SINGULAIR) 10 MG tablet Take 10 mg by mouth at bedtime.    [provider]  Needles & Syringes MISC 25G 5/8 inch needle on 3 ml syringe-Use once monthly for B12 injections 07/20/15   Nandigam, Karleen Hampshire V, MD  nitroGLYCERIN (NITROSTAT) 0.4 MG SL tablet PLACE 1 TABLET (0.4 MG TOTAL) UNDER THE TONGUE EVERY 5 (FIVE) MINUTES AS NEEDED FOR CHEST PAIN. 12/03/19   Sherren Mocha, MD  Omega-3 Fatty Acids (FISH OIL) 1000 MG CAPS Take 1 capsule by mouth daily. 07/18/09   [provider]  ondansetron (ZOFRAN) 4 MG tablet Take 1 tablet (4 mg total) by mouth every 8 (eight) hours as needed for nausea or vomiting. 09/29/20   Thurnell Lose, MD  [facility] VERIO test strip 1 each by Other route as directed.  04/18/16   [provider]  pantoprazole (PROTONIX) 40 MG tablet Take 1 tablet (40 mg total) by mouth 2 (two) times daily. 08/07/20   Mariel Aloe, MD  pantoprazole (PROTONIX) 40 MG tablet Take 1 tablet (40 mg total) by mouth 2 (two)  times daily. 10/17/20   Mauri Pole,  MD  ramipril (ALTACE) 5 MG capsule Take 1 capsule (5 mg total) by mouth at bedtime. 01/11/16   Sherren Mocha, MD  SYNTHROID 137 MCG tablet Take 137 mcg by mouth daily. 09/30/20   [provider]    Physical Exam: Vitals:   10/31/20 0115 10/31/20 0130 10/31/20 0136 10/31/20 0140  BP:   133/62   Pulse: 87 88 91 87  Resp: 14 19 (!) 25 17  Temp:      TempSrc:      SpO2: 96% (!) 85% 94% 97%  Weight:      Height:        Constitutional: NAD, calm, comfortable Eyes: PERRL, lids and conjunctivae normal ENMT: Mucous membranes are dry.. Posterior pharynx clear of any exudate or lesions. Neck: normal, supple, no masses, no thyromegaly Respiratory: Decreased breath sounds in bases, otherwise clear to auscultation bilaterally, no wheezing, no crackles. Normal respiratory effort. No accessory muscle use.  Cardiovascular: Regular rate and rhythm, no murmurs / rubs / gallops. No extremity edema. 2+ pedal pulses. No carotid bruits.  Abdomen: No distention.  Bowel sounds positive.  Soft, no tenderness, no guarding or rebound, no masses palpated. No hepatosplenomegaly. Musculoskeletal: no clubbing / cyanosis.  Good ROM, no contractures. Normal muscle tone.  Skin: Decreased skin turgor. Neurologic: CN 2-12 grossly intact. Sensation intact, DTR normal. Strength 5/5 in all 4.  Psychiatric: Normal judgment and insight. Alert and oriented x 3. Normal mood.   Labs on Admission: I have personally reviewed following labs and imaging studies  CBC: Recent Labs  Lab 10/30/20 2053  WBC 12.0*  HGB 11.1*  HCT 38.6  MCV 74.2*  PLT 464*    Basic Metabolic Panel: Recent Labs  Lab 10/30/20 2053 10/31/20 0037  NA 131*  --   K 2.8*  --   CL 89*  --   CO2 28  --   GLUCOSE 235*  --   BUN 17  --   CREATININE 0.85  --   CALCIUM 9.0  --   MG  --  0.8*  PHOS  --  3.5    GFR: Estimated Creatinine Clearance: 54.8 mL/min (by C-G formula based on SCr of  0.85 mg/dL).  Liver Function Tests: Recent Labs  Lab 10/30/20 2053  AST 21  ALT 14  ALKPHOS 58  BILITOT 2.1*  PROT 8.1  ALBUMIN 4.2   Radiological Exams on Admission: DG Chest Port 1 View  Result Date: 10/31/2020 CLINICAL DATA:  Chest pain, shortness of breath, and weakness. EXAM: PORTABLE CHEST 1 VIEW COMPARISON:  10/04/2020 FINDINGS: Shallow inspiration with atelectasis in the lung bases. Heart size and pulmonary vascularity are normal for technique. Esophageal hiatal hernia behind the heart. No pleural effusions. No pneumothorax. Mediastinal contours appear intact. Degenerative changes in the spine and shoulders. Postoperative changes in the cervical spine. IMPRESSION: Shallow inspiration with atelectasis in the lung bases. Electronically Signed   By: Lucienne Capers M.D.   On: 10/31/2020 02:44    EKG: Independently reviewed. Vent. rate 116 BPM PR interval * ms QRS duration 94 ms QT/QTc 339/471 ms P-R-T axes 24 16 191 Sinus tachycardia Low voltage, extremity leads Borderline repolarization abnormality  Assessment/Plan Principal Problem:   Hypokalemia due to excessive gastrointestinal loss of potassium Observation/telemetry. Continue potassium supplementation. Replenish magnesium. Follow-up potassium level. Follow-up with primary as an outpatient.  Active Problems:   Hypomagnesemia Replenishing. Advised to eat magnesium rich foods. Follow-up with PCP.    Benign essential HTN Continue amlodipine 5 mg p.o. daily.  Resume ramipril 5 mg p.o. daily. Follow blood pressure, renal function electrolytes Follow-up with PCP.    CAD (coronary artery disease) Continue aspirin, atorvastatin, ramipril and amlodipine. May benefit from beta-blocker. Follow-up with PCP and cardiologist.    Gastroesophageal reflux disease Continue pantoprazole 40 mg p.o. twice daily.    Adult hypothyroidism Continue Synthroid 137 mcg p.o. daily.    Microcytic anemia Monitor  H&H. Follow-up with PCP.    Hyperbilirubinemia Follow-up CMP. Follow-up with PCP.    Thrombocytosis In the setting of anemia and volume depletion.    Type 2 diabetes mellitus with hyperglycemia (HCC) Received IV fluids earlier. Carbohydrate modified diet. Continue Lantus at home dose. Lifestyle modifications. Follow-up with PCP and endocrinologist.    DVT prophylaxis: SCDs. Code Status:   Full code. Family Communication: Disposition Plan:   Patient is from:  Home.  Anticipated DC to:  Home.  Anticipated DC date:  11/01/2020.  Anticipated DC barriers: Clinical status.  Consults called: Admission status:  Observation/telemetry.  Severity of Illness:   Reubin Milan MD [facility]  How to contact the [facility] Attending or Consulting provider [place] or covering provider during after hours [place], for this patient?   1. Check the care team in [facility] and look for a) attending/consulting [facility] provider listed and b) the [facility] team listed 2. Log into [email or web address] and use [place]'s universal password to access. If you do not have the password, please contact the hospital operator. 3. Locate the [facility] provider you are looking for under [facility] and page to a number that you can be directly reached. 4. If you still have difficulty reaching the provider, please page the [facility] (Director on Call) for the Hospitalists listed on amion for assistance.  10/31/2020, 3:05 AM   This document was prepared using Dragon voice recognition software and may contain some unintended transcription errors.

## 2020-11-01 ENCOUNTER — Other Ambulatory Visit: Payer: Self-pay | Admitting: Cardiology

## 2020-11-01 DIAGNOSIS — I629 Nontraumatic intracranial hemorrhage, unspecified: Secondary | ICD-10-CM

## 2020-11-01 DIAGNOSIS — R55 Syncope and collapse: Secondary | ICD-10-CM

## 2020-11-07 ENCOUNTER — Encounter: Payer: Self-pay | Admitting: Gastroenterology

## 2020-11-08 ENCOUNTER — Encounter (INDEPENDENT_AMBULATORY_CARE_PROVIDER_SITE_OTHER): Payer: Medicare HMO | Admitting: Ophthalmology

## 2020-12-20 ENCOUNTER — Encounter: Payer: Self-pay | Admitting: Gastroenterology

## 2020-12-20 ENCOUNTER — Other Ambulatory Visit (INDEPENDENT_AMBULATORY_CARE_PROVIDER_SITE_OTHER): Payer: Medicare HMO

## 2020-12-20 ENCOUNTER — Inpatient Hospital Stay (HOSPITAL_COMMUNITY)
Admission: EM | Admit: 2020-12-20 | Discharge: 2020-12-24 | DRG: 393 | Disposition: A | Discharge status: Home / Self Care | Payer: Medicare HMO | Attending: Internal Medicine | Admitting: Internal Medicine

## 2020-12-20 ENCOUNTER — Other Ambulatory Visit: Payer: Self-pay

## 2020-12-20 ENCOUNTER — Encounter (HOSPITAL_COMMUNITY): Payer: Self-pay

## 2020-12-20 ENCOUNTER — Ambulatory Visit (INDEPENDENT_AMBULATORY_CARE_PROVIDER_SITE_OTHER): Payer: Medicare HMO | Admitting: Gastroenterology

## 2020-12-20 VITALS — BP 120/70 | HR 64 | Ht 64.0 in | Wt 169.0 lb

## 2020-12-20 DIAGNOSIS — K551 Chronic vascular disorders of intestine: Principal | ICD-10-CM | POA: Diagnosis present

## 2020-12-20 DIAGNOSIS — R42 Dizziness and giddiness: Secondary | ICD-10-CM

## 2020-12-20 DIAGNOSIS — K589 Irritable bowel syndrome without diarrhea: Secondary | ICD-10-CM | POA: Diagnosis present

## 2020-12-20 DIAGNOSIS — I10 Essential (primary) hypertension: Secondary | ICD-10-CM | POA: Diagnosis present

## 2020-12-20 DIAGNOSIS — E039 Hypothyroidism, unspecified: Secondary | ICD-10-CM | POA: Diagnosis present

## 2020-12-20 DIAGNOSIS — Z8041 Family history of malignant neoplasm of ovary: Secondary | ICD-10-CM

## 2020-12-20 DIAGNOSIS — Z801 Family history of malignant neoplasm of trachea, bronchus and lung: Secondary | ICD-10-CM

## 2020-12-20 DIAGNOSIS — K921 Melena: Secondary | ICD-10-CM | POA: Diagnosis not present

## 2020-12-20 DIAGNOSIS — G4733 Obstructive sleep apnea (adult) (pediatric): Secondary | ICD-10-CM | POA: Diagnosis present

## 2020-12-20 DIAGNOSIS — K648 Other hemorrhoids: Secondary | ICD-10-CM | POA: Diagnosis present

## 2020-12-20 DIAGNOSIS — M797 Fibromyalgia: Secondary | ICD-10-CM | POA: Diagnosis present

## 2020-12-20 DIAGNOSIS — F32A Depression, unspecified: Secondary | ICD-10-CM | POA: Diagnosis present

## 2020-12-20 DIAGNOSIS — G8929 Other chronic pain: Secondary | ICD-10-CM | POA: Diagnosis present

## 2020-12-20 DIAGNOSIS — E1165 Type 2 diabetes mellitus with hyperglycemia: Secondary | ICD-10-CM

## 2020-12-20 DIAGNOSIS — D62 Acute posthemorrhagic anemia: Secondary | ICD-10-CM | POA: Diagnosis present

## 2020-12-20 DIAGNOSIS — K5731 Diverticulosis of large intestine without perforation or abscess with bleeding: Secondary | ICD-10-CM | POA: Diagnosis present

## 2020-12-20 DIAGNOSIS — Z882 Allergy status to sulfonamides status: Secondary | ICD-10-CM

## 2020-12-20 DIAGNOSIS — Z8371 Family history of colonic polyps: Secondary | ICD-10-CM

## 2020-12-20 DIAGNOSIS — F419 Anxiety disorder, unspecified: Secondary | ICD-10-CM | POA: Diagnosis present

## 2020-12-20 DIAGNOSIS — D75839 Thrombocytosis, unspecified: Secondary | ICD-10-CM | POA: Diagnosis present

## 2020-12-20 DIAGNOSIS — Z7982 Long term (current) use of aspirin: Secondary | ICD-10-CM

## 2020-12-20 DIAGNOSIS — K922 Gastrointestinal hemorrhage, unspecified: Secondary | ICD-10-CM | POA: Diagnosis not present

## 2020-12-20 DIAGNOSIS — Z83438 Family history of other disorder of lipoprotein metabolism and other lipidemia: Secondary | ICD-10-CM

## 2020-12-20 DIAGNOSIS — D500 Iron deficiency anemia secondary to blood loss (chronic): Secondary | ICD-10-CM | POA: Diagnosis present

## 2020-12-20 DIAGNOSIS — K254 Chronic or unspecified gastric ulcer with hemorrhage: Secondary | ICD-10-CM | POA: Diagnosis present

## 2020-12-20 DIAGNOSIS — Z833 Family history of diabetes mellitus: Secondary | ICD-10-CM

## 2020-12-20 DIAGNOSIS — K2211 Ulcer of esophagus with bleeding: Secondary | ICD-10-CM | POA: Diagnosis present

## 2020-12-20 DIAGNOSIS — Z885 Allergy status to narcotic agent status: Secondary | ICD-10-CM

## 2020-12-20 DIAGNOSIS — Z8249 Family history of ischemic heart disease and other diseases of the circulatory system: Secondary | ICD-10-CM

## 2020-12-20 DIAGNOSIS — D649 Anemia, unspecified: Secondary | ICD-10-CM

## 2020-12-20 DIAGNOSIS — Z6828 Body mass index (BMI) 28.0-28.9, adult: Secondary | ICD-10-CM

## 2020-12-20 DIAGNOSIS — I2510 Atherosclerotic heart disease of native coronary artery without angina pectoris: Secondary | ICD-10-CM | POA: Diagnosis present

## 2020-12-20 DIAGNOSIS — K633 Ulcer of intestine: Secondary | ICD-10-CM | POA: Diagnosis present

## 2020-12-20 DIAGNOSIS — E663 Overweight: Secondary | ICD-10-CM | POA: Diagnosis present

## 2020-12-20 DIAGNOSIS — Z20822 Contact with and (suspected) exposure to covid-19: Secondary | ICD-10-CM | POA: Diagnosis present

## 2020-12-20 DIAGNOSIS — Z79890 Hormone replacement therapy (postmenopausal): Secondary | ICD-10-CM

## 2020-12-20 DIAGNOSIS — E785 Hyperlipidemia, unspecified: Secondary | ICD-10-CM | POA: Diagnosis present

## 2020-12-20 DIAGNOSIS — K317 Polyp of stomach and duodenum: Secondary | ICD-10-CM | POA: Diagnosis present

## 2020-12-20 DIAGNOSIS — E119 Type 2 diabetes mellitus without complications: Secondary | ICD-10-CM

## 2020-12-20 DIAGNOSIS — Z800 Family history of malignant neoplasm of digestive organs: Secondary | ICD-10-CM

## 2020-12-20 DIAGNOSIS — K5730 Diverticulosis of large intestine without perforation or abscess without bleeding: Secondary | ICD-10-CM | POA: Diagnosis present

## 2020-12-20 DIAGNOSIS — Z794 Long term (current) use of insulin: Secondary | ICD-10-CM

## 2020-12-20 DIAGNOSIS — Z79899 Other long term (current) drug therapy: Secondary | ICD-10-CM

## 2020-12-20 DIAGNOSIS — E876 Hypokalemia: Secondary | ICD-10-CM | POA: Diagnosis present

## 2020-12-20 DIAGNOSIS — K449 Diaphragmatic hernia without obstruction or gangrene: Secondary | ICD-10-CM | POA: Diagnosis present

## 2020-12-20 LAB — CBC WITH DIFFERENTIAL/PLATELET
Abs Immature Granulocytes: 0.04 10*3/uL (ref 0.00–0.07)
Basophils Absolute: 0 10*3/uL (ref 0.0–0.1)
Basophils Absolute: 0.1 10*3/uL (ref 0.0–0.1)
Basophils Relative: 0.5 % (ref 0.0–3.0)
Basophils Relative: 1 %
Eosinophils Absolute: 0.3 10*3/uL (ref 0.0–0.7)
Eosinophils Absolute: 0.2 10*3/uL (ref 0.0–0.5)
Eosinophils Relative: 2.8 % (ref 0.0–5.0)
Eosinophils Relative: 2 %
HCT: 24.4 % — ABNORMAL LOW (ref 36.0–46.0)
HCT: 25.6 % — ABNORMAL LOW (ref 36.0–46.0)
Hemoglobin: 7.7 g/dL — CL (ref 12.0–15.0)
Hemoglobin: 7.3 g/dL — ABNORMAL LOW (ref 12.0–15.0)
Immature Granulocytes: 0 %
Lymphocytes Relative: 20.4 % (ref 12.0–46.0)
Lymphocytes Relative: 24 %
Lymphs Abs: 2.1 10*3/uL (ref 0.7–4.0)
Lymphs Abs: 2.2 10*3/uL (ref 0.7–4.0)
MCH: 21.2 pg — ABNORMAL LOW (ref 26.0–34.0)
MCHC: 31.4 g/dL (ref 30.0–36.0)
MCHC: 28.5 g/dL — ABNORMAL LOW (ref 30.0–36.0)
MCV: 68.9 fl — ABNORMAL LOW (ref 78.0–100.0)
MCV: 74.2 fL — ABNORMAL LOW (ref 80.0–100.0)
Monocytes Absolute: 1 10*3/uL (ref 0.1–1.0)
Monocytes Absolute: 0.8 10*3/uL (ref 0.1–1.0)
Monocytes Relative: 9.8 % (ref 3.0–12.0)
Monocytes Relative: 8 %
Neutro Abs: 6.8 10*3/uL (ref 1.4–7.7)
Neutro Abs: 5.9 10*3/uL (ref 1.7–7.7)
Neutrophils Relative %: 66.5 % (ref 43.0–77.0)
Neutrophils Relative %: 65 %
Platelets: 628 10*3/uL — ABNORMAL HIGH (ref 150.0–400.0)
Platelets: 628 10*3/uL — ABNORMAL HIGH (ref 150–400)
RBC: 3.54 Mil/uL — ABNORMAL LOW (ref 3.87–5.11)
RBC: 3.45 MIL/uL — ABNORMAL LOW (ref 3.87–5.11)
RDW: 20.4 % — ABNORMAL HIGH (ref 11.5–15.5)
RDW: 19.1 % — ABNORMAL HIGH (ref 11.5–15.5)
WBC: 10.3 10*3/uL (ref 4.0–10.5)
WBC: 9.2 10*3/uL (ref 4.0–10.5)
nRBC: 0 % (ref 0.0–0.2)

## 2020-12-20 LAB — COMPREHENSIVE METABOLIC PANEL
ALT: 26 U/L (ref 0–35)
ALT: 33 U/L (ref 0–44)
AST: 54 U/L — ABNORMAL HIGH (ref 0–37)
AST: 66 U/L — ABNORMAL HIGH (ref 15–41)
Albumin: 3.8 g/dL (ref 3.5–5.2)
Albumin: 3.7 g/dL (ref 3.5–5.0)
Alkaline Phosphatase: 42 U/L (ref 39–117)
Alkaline Phosphatase: 42 U/L (ref 38–126)
Anion gap: 14 (ref 5–15)
BUN: 18 mg/dL (ref 6–23)
BUN: 20 mg/dL (ref 8–23)
CO2: 34 mEq/L — ABNORMAL HIGH (ref 19–32)
CO2: 29 mmol/L (ref 22–32)
Calcium: 9 mg/dL (ref 8.4–10.5)
Calcium: 9.1 mg/dL (ref 8.9–10.3)
Chloride: 91 mEq/L — ABNORMAL LOW (ref 96–112)
Chloride: 93 mmol/L — ABNORMAL LOW (ref 98–111)
Creatinine, Ser: 1.08 mg/dL (ref 0.40–1.20)
Creatinine, Ser: 1.11 mg/dL — ABNORMAL HIGH (ref 0.44–1.00)
GFR, Estimated: 51 mL/min — ABNORMAL LOW (ref >60)
GFR: 49.47 mL/min — ABNORMAL LOW (ref >60.00)
Glucose, Bld: 214 mg/dL — ABNORMAL HIGH (ref 70–99)
Glucose, Bld: 255 mg/dL — ABNORMAL HIGH (ref 70–99)
Potassium: 3.6 mEq/L (ref 3.5–5.1)
Potassium: 3.1 mmol/L — ABNORMAL LOW (ref 3.5–5.1)
Sodium: 135 mEq/L (ref 135–145)
Sodium: 136 mmol/L (ref 135–145)
Total Bilirubin: 0.8 mg/dL (ref 0.2–1.2)
Total Bilirubin: 1.6 mg/dL — ABNORMAL HIGH (ref 0.3–1.2)
Total Protein: 7 g/dL (ref 6.0–8.3)
Total Protein: 6.8 g/dL (ref 6.5–8.1)

## 2020-12-20 LAB — RESP PANEL BY RT-PCR (FLU A&B, COVID) ARPGX2
Influenza A by PCR: NEGATIVE
Influenza B by PCR: NEGATIVE
SARS Coronavirus 2 by RT PCR: NEGATIVE

## 2020-12-20 LAB — PREPARE RBC (CROSSMATCH)

## 2020-12-20 LAB — LIPASE, BLOOD: Lipase: 23 U/L (ref 11–51)

## 2020-12-20 LAB — HEMOGLOBIN A1C
Hgb A1c MFr Bld: 7.4 % — ABNORMAL HIGH (ref 4.8–5.6)
Mean Plasma Glucose: 165.68 mg/dL

## 2020-12-20 LAB — POC OCCULT BLOOD, ED: Fecal Occult Bld: NEGATIVE

## 2020-12-20 LAB — HEMOGLOBIN AND HEMATOCRIT, BLOOD
HCT: 27 % — ABNORMAL LOW (ref 36.0–46.0)
Hemoglobin: 7.7 g/dL — ABNORMAL LOW (ref 12.0–15.0)

## 2020-12-20 LAB — TROPONIN I (HIGH SENSITIVITY)
Troponin I (High Sensitivity): 2 ng/L (ref <18)
Troponin I (High Sensitivity): 3 ng/L (ref <18)

## 2020-12-20 LAB — HEMOGLOBIN: Hemoglobin: 7.3 g/dL — ABNORMAL LOW (ref 12.0–15.0)

## 2020-12-20 LAB — GLUCOSE, CAPILLARY: Glucose-Capillary: 227 mg/dL — ABNORMAL HIGH (ref 70–99)

## 2020-12-20 MED ORDER — MAGNESIUM OXIDE 400 (241.3 MG) MG PO TABS
400.0000 mg | ORAL_TABLET | Freq: Every day | ORAL | Status: DC
  Administered 2020-12-22 – 2020-12-24 (×3): 400 mg via ORAL
  Filled 2020-12-20 (×4): qty 1

## 2020-12-20 MED ORDER — MONTELUKAST SODIUM 10 MG PO TABS
10.0000 mg | ORAL_TABLET | Freq: Every day | ORAL | Status: DC
  Administered 2020-12-20 – 2020-12-23 (×4): 10 mg via ORAL
  Filled 2020-12-20 (×5): qty 1

## 2020-12-20 MED ORDER — SODIUM CHLORIDE 0.9 % IV BOLUS
500.0000 mL | Freq: Once | INTRAVENOUS | Status: AC
  Administered 2020-12-20: 500 mL via INTRAVENOUS

## 2020-12-20 MED ORDER — PANTOPRAZOLE SODIUM 40 MG IV SOLR: 40.0000 mg | Freq: Two times a day (BID) | INTRAVENOUS | Status: DC

## 2020-12-20 MED ORDER — POTASSIUM CHLORIDE CRYS ER 20 MEQ PO TBCR
30.0000 meq | EXTENDED_RELEASE_TABLET | Freq: Once | ORAL | Status: AC
  Administered 2020-12-20: 30 meq via ORAL
  Filled 2020-12-20: qty 1

## 2020-12-20 MED ORDER — METOLAZONE 5 MG PO TABS
5.0000 mg | ORAL_TABLET | Freq: Every day | ORAL | Status: DC
  Administered 2020-12-22 – 2020-12-24 (×3): 5 mg via ORAL
  Filled 2020-12-20 (×4): qty 1

## 2020-12-20 MED ORDER — ONDANSETRON HCL 4 MG PO TABS: 4.0000 mg | ORAL_TABLET | Freq: Four times a day (QID) | ORAL | Status: DC | PRN

## 2020-12-20 MED ORDER — DOXEPIN HCL 10 MG PO CAPS
10.0000 mg | ORAL_CAPSULE | Freq: Every day | ORAL | Status: DC
  Administered 2020-12-20 – 2020-12-23 (×4): 10 mg via ORAL
  Filled 2020-12-20 (×4): qty 1

## 2020-12-20 MED ORDER — MIDODRINE HCL 5 MG PO TABS
10.0000 mg | ORAL_TABLET | Freq: Every day | ORAL | Status: DC
  Administered 2020-12-22 – 2020-12-24 (×3): 10 mg via ORAL
  Filled 2020-12-20 (×4): qty 2

## 2020-12-20 MED ORDER — INSULIN GLARGINE 100 UNIT/ML ~~LOC~~ SOLN
10.0000 [IU] | Freq: Every day | SUBCUTANEOUS | Status: DC
  Administered 2020-12-20: 10 [IU] via SUBCUTANEOUS
  Filled 2020-12-20 (×3): qty 0.1

## 2020-12-20 MED ORDER — INSULIN GLARGINE 100 UNIT/ML ~~LOC~~ SOLN: 10.0000 [IU] | Freq: Every day | SUBCUTANEOUS | Status: DC

## 2020-12-20 MED ORDER — PANTOPRAZOLE SODIUM 40 MG IV SOLR
40.0000 mg | Freq: Once | INTRAVENOUS | Status: AC
  Administered 2020-12-20: 40 mg via INTRAVENOUS
  Filled 2020-12-20: qty 40

## 2020-12-20 MED ORDER — HYDROXYZINE HCL 25 MG PO TABS
50.0000 mg | ORAL_TABLET | Freq: Three times a day (TID) | ORAL | Status: DC | PRN
  Administered 2020-12-23: 50 mg via ORAL
  Filled 2020-12-20: qty 2

## 2020-12-20 MED ORDER — ACETAMINOPHEN 650 MG RE SUPP: 650.0000 mg | Freq: Four times a day (QID) | RECTAL | Status: DC | PRN

## 2020-12-20 MED ORDER — GABAPENTIN 300 MG PO CAPS
300.0000 mg | ORAL_CAPSULE | Freq: Every day | ORAL | Status: DC
  Administered 2020-12-20 – 2020-12-23 (×4): 300 mg via ORAL
  Filled 2020-12-20 (×4): qty 1

## 2020-12-20 MED ORDER — FLUTICASONE PROPIONATE 50 MCG/ACT NA SUSP
2.0000 | Freq: Every day | NASAL | Status: DC
  Administered 2020-12-20 – 2020-12-24 (×3): 2 via NASAL
  Filled 2020-12-20: qty 16

## 2020-12-20 MED ORDER — TRAMADOL HCL 50 MG PO TABS
50.0000 mg | ORAL_TABLET | Freq: Every day | ORAL | Status: DC | PRN
  Administered 2020-12-23: 50 mg via ORAL
  Filled 2020-12-20: qty 1

## 2020-12-20 MED ORDER — VITAMIN D 25 MCG (1000 UNIT) PO TABS
2000.0000 [IU] | ORAL_TABLET | Freq: Every day | ORAL | Status: DC
  Administered 2020-12-20 – 2020-12-24 (×4): 2000 [IU] via ORAL
  Filled 2020-12-20 (×4): qty 2

## 2020-12-20 MED ORDER — MECLIZINE HCL 25 MG PO TABS: 25.0000 mg | ORAL_TABLET | Freq: Three times a day (TID) | ORAL | Status: DC | PRN

## 2020-12-20 MED ORDER — POTASSIUM CHLORIDE 20 MEQ PO PACK: 20.0000 meq | PACK | Freq: Two times a day (BID) | ORAL | Status: DC

## 2020-12-20 MED ORDER — FERROUS SULFATE 325 (65 FE) MG PO TABS
325.0000 mg | ORAL_TABLET | Freq: Every day | ORAL | Status: DC
  Administered 2020-12-22 – 2020-12-24 (×3): 325 mg via ORAL
  Filled 2020-12-20 (×3): qty 1

## 2020-12-20 MED ORDER — METOPROLOL SUCCINATE ER 50 MG PO TB24
50.0000 mg | ORAL_TABLET | Freq: Every day | ORAL | Status: DC
  Administered 2020-12-22 – 2020-12-24 (×3): 50 mg via ORAL
  Filled 2020-12-20 (×3): qty 1

## 2020-12-20 MED ORDER — LINACLOTIDE 145 MCG PO CAPS
290.0000 ug | ORAL_CAPSULE | Freq: Every day | ORAL | Status: DC
  Administered 2020-12-20 – 2020-12-24 (×4): 290 ug via ORAL
  Filled 2020-12-20 (×5): qty 2

## 2020-12-20 MED ORDER — LEVOTHYROXINE SODIUM 25 MCG PO TABS
137.0000 ug | ORAL_TABLET | Freq: Every day | ORAL | Status: DC
  Administered 2020-12-21 – 2020-12-24 (×2): 137 ug via ORAL
  Filled 2020-12-20 (×3): qty 1

## 2020-12-20 MED ORDER — MELATONIN 5 MG PO TABS
10.0000 mg | ORAL_TABLET | Freq: Every evening | ORAL | Status: DC | PRN
  Administered 2020-12-20 – 2020-12-23 (×2): 10 mg via ORAL
  Filled 2020-12-20 (×2): qty 2

## 2020-12-20 MED ORDER — MIDODRINE HCL 5 MG PO TABS: 10.0000 mg | ORAL_TABLET | Freq: Every day | ORAL | Status: DC

## 2020-12-20 MED ORDER — ACETAMINOPHEN 325 MG PO TABS
650.0000 mg | ORAL_TABLET | Freq: Four times a day (QID) | ORAL | Status: DC | PRN
  Administered 2020-12-20: 650 mg via ORAL
  Filled 2020-12-20: qty 2

## 2020-12-20 MED ORDER — DULOXETINE HCL 60 MG PO CPEP
60.0000 mg | ORAL_CAPSULE | Freq: Every day | ORAL | Status: DC
  Administered 2020-12-22 – 2020-12-24 (×2): 60 mg via ORAL
  Filled 2020-12-20 (×3): qty 1

## 2020-12-20 MED ORDER — SODIUM CHLORIDE 0.9 % IV SOLN: INTRAVENOUS | Status: DC

## 2020-12-20 MED ORDER — ONDANSETRON HCL 4 MG/2ML IJ SOLN
4.0000 mg | Freq: Four times a day (QID) | INTRAMUSCULAR | Status: DC | PRN
  Administered 2020-12-22: 4 mg via INTRAVENOUS
  Filled 2020-12-20: qty 2

## 2020-12-20 MED ORDER — TIZANIDINE HCL 4 MG PO TABS: 4.0000 mg | ORAL_TABLET | Freq: Every day | ORAL | Status: DC | PRN

## 2020-12-20 MED ORDER — ATORVASTATIN CALCIUM 20 MG PO TABS
20.0000 mg | ORAL_TABLET | Freq: Every day | ORAL | Status: DC
  Administered 2020-12-20 – 2020-12-24 (×4): 20 mg via ORAL
  Filled 2020-12-20 (×4): qty 1

## 2020-12-20 MED ORDER — SODIUM CHLORIDE 0.9% IV SOLUTION: Freq: Once | INTRAVENOUS | Status: AC

## 2020-12-20 MED ORDER — AMLODIPINE BESYLATE 5 MG PO TABS
5.0000 mg | ORAL_TABLET | Freq: Every day | ORAL | Status: DC
  Administered 2020-12-22 – 2020-12-24 (×3): 5 mg via ORAL
  Filled 2020-12-20 (×3): qty 1

## 2020-12-20 MED ORDER — VITAMIN D3 50 MCG (2000 UT) PO CAPS: 2000.0000 [IU] | ORAL_CAPSULE | Freq: Every day | ORAL | Status: DC

## 2020-12-20 MED ORDER — HYDROXYZINE PAMOATE 50 MG PO CAPS: 50.0000 mg | ORAL_CAPSULE | Freq: Three times a day (TID) | ORAL | Status: DC | PRN

## 2020-12-20 MED ORDER — SENNOSIDES-DOCUSATE SODIUM 8.6-50 MG PO TABS: 1.0000 | ORAL_TABLET | Freq: Every evening | ORAL | Status: DC | PRN

## 2020-12-20 MED ORDER — FUROSEMIDE 40 MG PO TABS
40.0000 mg | ORAL_TABLET | ORAL | Status: DC
Start: 2020-12-21 — End: 2020-12-24

## 2020-12-20 MED ORDER — LORATADINE 10 MG PO TABS
10.0000 mg | ORAL_TABLET | Freq: Every day | ORAL | Status: DC
  Administered 2020-12-20 – 2020-12-24 (×4): 10 mg via ORAL
  Filled 2020-12-20 (×4): qty 1

## 2020-12-20 MED ORDER — MELATONIN 10 MG PO TABS: 10.0000 mg | ORAL_TABLET | Freq: Every evening | ORAL | Status: DC | PRN

## 2020-12-20 MED ORDER — TRAZODONE HCL 50 MG PO TABS: 25.0000 mg | ORAL_TABLET | Freq: Every evening | ORAL | Status: DC | PRN

## 2020-12-20 MED ORDER — INSULIN ASPART 100 UNIT/ML ~~LOC~~ SOLN
0.0000 [IU] | Freq: Four times a day (QID) | SUBCUTANEOUS | Status: DC
  Administered 2020-12-21: 1 [IU] via SUBCUTANEOUS
  Administered 2020-12-22: 2 [IU] via SUBCUTANEOUS
  Administered 2020-12-22: 7 [IU] via SUBCUTANEOUS
  Administered 2020-12-22: 2 [IU] via SUBCUTANEOUS
  Administered 2020-12-22: 3 [IU] via SUBCUTANEOUS
  Administered 2020-12-23: 1 [IU] via SUBCUTANEOUS
  Administered 2020-12-23: 7 [IU] via SUBCUTANEOUS
  Administered 2020-12-24: 5 [IU] via SUBCUTANEOUS
  Administered 2020-12-24: 3 [IU] via SUBCUTANEOUS
  Filled 2020-12-20: qty 0.09

## 2020-12-20 MED ORDER — PANTOPRAZOLE SODIUM 40 MG IV SOLR
40.0000 mg | Freq: Two times a day (BID) | INTRAVENOUS | Status: DC
  Administered 2020-12-20 – 2020-12-24 (×8): 40 mg via INTRAVENOUS
  Filled 2020-12-20 (×8): qty 40

## 2020-12-20 MED ORDER — CO Q-10 100 MG PO CAPS: 100.0000 mg | ORAL_CAPSULE | Freq: Every day | ORAL | Status: DC

## 2020-12-20 NOTE — Progress Notes (Signed)
Jeanne Kim    [id number]    Jan 21, 1941  Primary Care Physician:Hasanaj, Samul Dada, MD  Referring Physician: Neale Burly, MD [place],  [place] [address]   Chief complaint: Dark tarry stool and mucus  HPI:  80 year old very pleasant female with history of CAD, type 2 diabetes, hypertension, hyperlipidemia, recently hospitalized after syncopal episode, had traumatic subdural hematoma in January 2022 here for follow-up visit with complaints of dark tarry stool  She was having rectal bleeding and mucus when she was hospitalized with sepsis, that had resolved.  This morning she noticed really dark tarry stool with mucus similar to what she had when she was hospitalized in October of last year with stomach ulcers She was also dizzy this morning.  She feels really weak and tired.  Denies any abdominal pain or vomiting.  She underwent extensive evaluation during hospitalization including CT scanning of head, EEG, echocardiogram and was monitored on telemetry.  She had a 30-day event monitor. A.m. cortisol level was low and had electrolyte abnormality with hypokalemia and hypomagnesium  otherwise rest of the work-up was unremarkable  Prior to that she was hospitalized in October 2021 with melena.  EGD August 06, 2020 during hospitalization showed multiple nonbleeding gastric ulcers and LA grade a erosive esophagitis.  Gastric biopsies negative for dysplasia, intestinal metaplasia or malignancy.  Negative for H. pylori bacterial infection.  She continues to have irregular bowel habits with alternating constipation and diarrhea.  She is taking Linzess Denies any rectal bleeding or melena.  Sometimes she has to strain excessively during defecation.  Colonoscopy 02/13/2017 Sigmoid diverticulosis, internal hemorrhoids  colonoscopy in March 2013 by Dr. Sharlett Iles showed diverticulosis and internal hemorrhoids with no polyps  Patient has family history of colon  cancer and also personal history of adenomatous polyps  status post hysterectomy, repair of rectocele, cystocele and enterocele,adhesiolysis  Anorectal manometry with findings suggestive of dyssynergy defecation status post pelvic floor physical therapy   Outpatient Encounter Medications as of 12/20/2020  Medication Sig   acetaminophen (TYLENOL) 500 MG tablet Take 1 tablet (500 mg total) by mouth every 8 (eight) hours as needed for moderate pain or headache.   amLODipine (NORVASC) 5 MG tablet Take 5 mg by mouth daily.   aspirin 81 MG chewable tablet Chew 1 tablet (81 mg total) by mouth daily.   atorvastatin (LIPITOR) 20 MG tablet Take 1 tablet (20 mg total) by mouth daily.   CALCIUM-MAGNESIUM-ZINC PO Take 3 tablets by mouth daily.   Coenzyme Q10 (CO Q-10) 100 MG CAPS Take 100 mg by mouth daily.   cyanocobalamin (,VITAMIN B-12,) 1000 MCG/ML injection INJECT 1 ML INTO MUSCLE ONCE A MONTH (Patient taking differently: Inject 1,000 mcg into the muscle every 30 (thirty) days. INJECT 1 ML INTO MUSCLE ONCE A MONTH)   doxepin (SINEQUAN) 10 MG capsule Take 10 mg by mouth at bedtime.   DULoxetine (CYMBALTA) 60 MG capsule Take 60 mg by mouth daily.   EPINEPHrine 0.3 mg/0.3 mL IJ SOAJ injection Inject 0.3 mg into the muscle as needed for anaphylaxis.   fexofenadine (ALLEGRA) 30 MG tablet Take 30 mg by mouth daily.   fluticasone (FLONASE) 50 MCG/ACT nasal spray Place 2 sprays into the nose daily.   furosemide (LASIX) 40 MG tablet Take 1 tablet (40 mg total) by mouth 3 (three) times a week. Take on Monday/Wednesday/Friday   gabapentin (NEURONTIN) 300 MG capsule Take 300 mg by mouth at bedtime.  gentamicin (GARAMYCIN) 0.3 % ophthalmic solution Place 1 drop into both eyes 3 (three) times daily as needed (red or irritated eyes).   Insulin Glargine-Lixisenatide 100-33 UNT-MCG/ML SOPN Inject 20-25 Units into the skin daily. Pt adjust between 20-25 units   linaclotide (LINZESS) 290 MCG CAPS  capsule Take 1 capsule (290 mcg total) by mouth daily.   Magnesium Oxide 250 MG TABS Take 1 tablet (250 mg total) by mouth daily.   meclizine (ANTIVERT) 25 MG tablet Take 1 tablet (25 mg total) by mouth 3 (three) times daily as needed for dizziness.   Melatonin 10 MG TABS Take 10 mg by mouth at bedtime as needed (sleep).   metFORMIN (GLUCOPHAGE) 1000 MG tablet Take 1,000 mg by mouth 2 (two) times daily with a meal.   metolazone (ZAROXOLYN) 5 MG tablet Take 2.5 mg by mouth daily.   metoprolol succinate (TOPROL-XL) 50 MG 24 hr tablet TAKE 1 TABLET BY MOUTH EVERY DAY (Patient taking differently: Take 50 mg by mouth daily.)   montelukast (SINGULAIR) 10 MG tablet Take 10 mg by mouth at bedtime.   Needles & Syringes MISC 25G 5/8 inch needle on 3 ml syringe-Use once monthly for B12 injections   nitroGLYCERIN (NITROSTAT) 0.4 MG SL tablet PLACE 1 TABLET (0.4 MG TOTAL) UNDER THE TONGUE EVERY 5 (FIVE) MINUTES AS NEEDED FOR CHEST PAIN.   Omega-3 Fatty Acids (FISH OIL) 1000 MG CAPS Take 1 capsule by mouth daily.   ondansetron (ZOFRAN) 4 MG tablet Take 1 tablet (4 mg total) by mouth every 8 (eight) hours as needed for nausea or vomiting.   ONETOUCH VERIO test strip 1 each by Other route as directed.    pantoprazole (PROTONIX) 40 MG tablet Take 1 tablet (40 mg total) by mouth 2 (two) times daily.   pantoprazole (PROTONIX) 40 MG tablet Take 1 tablet (40 mg total) by mouth 2 (two) times daily.   ramipril (ALTACE) 5 MG capsule Take 1 capsule (5 mg total) by mouth at bedtime.   SYNTHROID 137 MCG tablet Take 137 mcg by mouth daily.   potassium chloride (KLOR-CON) 10 MEQ tablet Take 1 tablet (10 mEq total) by mouth daily for 15 days.   No facility-administered encounter medications on file as of 12/20/2020.    Allergies as of 12/20/2020 - Review Complete 12/20/2020  Allergen Reaction Noted   Codeine Hives 04/27/2009   Sulfonamide derivatives Hives 04/27/2009    Past Medical History:   Diagnosis Date   Allergy    Anemia    Anginal pain ([place])    Anxiety    Arthritis    Blood transfusion without reported diagnosis    Breast cancer ([place])    Carotid artery plaque    bilat   Cataract    Colon polyps    adenomatous   Concussion 02/2016   Driving restrictions for 6w   Coronary heart disease    Depression    Diverticulosis    Dizziness    Esophageal reflux    Esophageal stricture    Fibromyalgia    Gallstones    Hiatal hernia    Hypercholesterolemia    Hypertension    Hypothyroidism    IBS (irritable bowel syndrome)    Kidney stones    Osteoporosis    Pneumonia    recently, just finished ABO   Pneumonia    PONV (postoperative nausea and vomiting)    Sleep apnea    CPAP   Thyroid disease 2002   Type II or unspecified type diabetes mellitus  without mention of complication, not stated as uncontrolled    Unspecified gastritis and gastroduodenitis without mention of hemorrhage     Past Surgical History:  Procedure Laterality Date   ANAL RECTAL MANOMETRY N/A 08/05/2015   Procedure: ANO RECTAL MANOMETRY;  Surgeon: Mauri Pole, MD;  Location: [facility];  Service: Endoscopy;  Laterality: N/A;   APPENDECTOMY     BIOPSY  08/06/2020   Procedure: BIOPSY;  Surgeon: Eloise Harman, DO;  Location: [facility];  Service: Endoscopy;;   BREAST LUMPECTOMY     left   CARDIAC CATHETERIZATION     CATARACT EXTRACTION W/PHACO Left 03/30/2013   Procedure: CATARACT EXTRACTION PHACO AND INTRAOCULAR LENS PLACEMENT (IOC);  Surgeon: Tonny Branch, MD;  Location: [facility];  Service: Ophthalmology;  Laterality: Left;  CDE:19.28   CATARACT EXTRACTION W/PHACO Right 04/09/2013   Procedure: CATARACT EXTRACTION PHACO AND INTRAOCULAR LENS PLACEMENT (IOC);  Surgeon: Tonny Branch, MD;  Location: [facility];  Service: Ophthalmology;  Laterality: Right;  CDE: 15.44   CERVICAL FUSION     CESAREAN SECTION     CHOLECYSTECTOMY     CYSTOCELE REPAIR      DILATION AND CURETTAGE OF UTERUS     ESOPHAGOGASTRODUODENOSCOPY (EGD) WITH PROPOFOL N/A 08/06/2020   Procedure: ESOPHAGOGASTRODUODENOSCOPY (EGD) WITH PROPOFOL;  Surgeon: Eloise Harman, DO;  Location: [facility];  Service: Endoscopy;  Laterality: N/A;   EYE SURGERY Left 03-30-13   Cataract   EYE SURGERY Right 04-09-13   Cataract   RECTOCELE REPAIR     THYROIDECTOMY     TONSILLECTOMY     TOTAL ABDOMINAL HYSTERECTOMY      Family History  Problem Relation Age of Onset   Sarcoidosis Mother    Diabetes Mother    Cancer Mother        sarcoma -  Right arm amputation   Hypertension Mother    Lung cancer Father    Diabetes Father    Heart disease Father        Before age 47   Urolithiasis Father    Cancer Father        Lung   Hyperlipidemia Father    Hypertension Father    Esophageal cancer Maternal Uncle    Ovarian cancer Maternal Aunt    Colon cancer Maternal Uncle    Lung cancer Maternal Uncle    Diabetes Maternal Uncle    Diabetes Maternal Aunt    Colon polyps Maternal Uncle    Heart disease Paternal Grandmother     Social History   Socioeconomic History   Marital status: Married    Spouse name: Not on file   Number of children: 4   Years of education: Not on file   Highest education level: Not on file  Occupational History   Occupation: Retired Optician, dispensing: RETIRED  Tobacco Use   Smoking status: Never Smoker   Smokeless tobacco: Never Used  Scientific laboratory technician Use: Never used  Substance and Sexual Activity   Alcohol use: No   Drug use: No   Sexual activity: Not on file  Other Topics Concern   Not on file  Social History Narrative   Not on file   Social Determinants of Health   Financial Resource Strain: Not on file  Food Insecurity: Not on file  Transportation Needs: Not on file  Physical Activity: Not on file  Stress: Not on file  Social Connections: Not on file  Intimate Partner Violence: Not on  file      Review of systems: All other review of systems negative except as mentioned in the HPI.   Physical Exam: Vitals:   12/20/20 1056  BP: 120/70  Pulse: 64   Body mass index is 29.01 kg/m. Gen:      No acute distress HEENT:  sclera anicteric Abd:      soft, non-tender; no palpable masses, no distension Ext:    No edema Neuro: alert and oriented x 3 Psych: normal mood and affect  Data Reviewed:  Reviewed labs, radiology imaging, old records and pertinent past GI work up   Assessment and Plan/Recommendations:  80 year old very pleasant female with history of chronic idiopathic constipation, dyssynergic defecation due to pelvic floor dysfunction with cystocele, enterocele and rectocele s/p repair Recent multiple hospitalizations History of multiple gastric ulcers and severe erosive esophagitis with complaints of recurrent melena and dizziness  Her presentation is very concerning for recurrent upper GI bleed but patient is very hesitant to go to ER We will check a stat CBC and CMP  Advised patient that I would recommend patient to come to the ER if hemoglobin is significantly lower, she will likely need hospitalization and blood transfusion.  She will also need EGD to exclude recurrent gastric ulcers or any high risk lesion for ongoing GI bleed Continue Protonix twice daily and avoid NSAIDs  This visit required 40 minutes of patient care (this includes precharting, chart review, review of results, face-to-face time used for counseling as well as treatment plan and follow-up. The patient was provided an opportunity to ask questions and all were answered. The patient agreed with the plan and demonstrated an understanding of the instructions.  Damaris Hippo , MD    CC: Neale Burly, MD

## 2020-12-20 NOTE — Patient Instructions (Addendum)
If you are age 78 or older, your body mass index should be between 23-30. Your Body mass index is 29.01 kg/m. If this is out of the aforementioned range listed, please consider follow up with your Primary Care Provider.  If you are age 19 or younger, your body mass index should be between 19-25. Your Body mass index is 29.01 kg/m. If this is out of the aformentioned range listed, please consider follow up with your Primary Care Provider.   Your provider has requested that you go to the basement level for lab work before leaving today. Press "B" on the elevator. The lab is located at the first door on the left as you exit the elevator.  If hemoglobin is less than 8 you will need to come to the ER or if you have worsening symptoms.   I appreciate the opportunity to care for you. Thank you for choosing me and [facility],  Dr. Harl Bowie

## 2020-12-20 NOTE — Progress Notes (Signed)
PHARMACIST - PHYSICIAN ORDER COMMUNICATION  CONCERNING: P&T Medication Policy on Herbal Medications  DESCRIPTION:  This patient's order for:  Co Q-10  has been noted.  This product(s) is classified as an "herbal" or natural product. Due to a lack of definitive safety studies or FDA approval, nonstandard manufacturing practices, plus the potential risk of unknown drug-drug interactions while on inpatient medications, the Pharmacy and Therapeutics Committee does not permit the use of "herbal" or natural products of this type within [facility].   ACTION TAKEN: The pharmacy department is unable to verify this order at this time and your patient has been informed of this safety policy. Please reevaluate patient's clinical condition at discharge and address if the herbal or natural product(s) should be resumed at that time.  Dia Sitter, PharmD, BCPS 12/20/2020 5:39 PM

## 2020-12-20 NOTE — Consult Note (Signed)
Consultation  Referring Provider: Dr. Roderic Palau    Primary Care Physician:  Neale Burly, MD Primary Gastroenterologist: Dr. Silverio Decamp       Reason for Consultation: Melena            HPI:   Jeanne Kim is a 80 y.o. female with a past medical history of CAD, type 2 diabetes, hypertension, hyperlipidemia and recently hospitalized for syncopal episode with a traumatic subdural hematoma in January 2022, who presents to the ER today per recommendations from our outpatient clinic for dark tarry stool.    Patient was seen in our outpatient clinic today, please see that note from Dr. Silverio Decamp for details, but essentially patient described having rectal bleeding and mucus when she was hospitalized with sepsis which resolved, but this morning noticed really dark tarry stools with mucus similar to what she had when hospitalized in October of last year with stomach ulcers.  Associated symptoms include weakness and dizziness.  In clinic patient had a stat CBC and CMP.  Her hemoglobin was significantly lower than her normal at 7.7 (9.2 a month ago) and she was told to go to the ER.    When seen in the ER patient tells me that she just had 1 melenic stool this morning but it was quite large, she has had none since then but tells me she has been felt weak over the past 3 days and has felt dizzy today.  Describes her history of ulcers in the past.    Denies fever, chills or weight loss.  GI history: 08/06/2020 EGD with multiple nonbleeding gastric ulcers and LA grade a erosive esophagitis, biopsies negative for dysplasia, intestinal metaplasia or malignancy, negative for H. Pylori  Colonoscopy 02/13/2017 Sigmoid diverticulosis, internal hemorrhoids  colonoscopy in March 2013 by Dr. 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 Allergy   . Anemia   . Anginal pain ([place])   . Anxiety   . Arthritis   . Blood transfusion without reported diagnosis   . Breast cancer ([place])   . Carotid artery plaque    bilat  . Cataract   . Colon polyps    adenomatous  . Concussion 02/2016   Driving restrictions for 6w  . Coronary heart disease   . Depression   . Diverticulosis   . Dizziness   . Esophageal reflux   . Esophageal stricture   . Fibromyalgia   . Gallstones   . Hiatal hernia   . Hypercholesterolemia   . Hypertension   . Hypothyroidism   . IBS (irritable bowel syndrome)   . Kidney stones   . Osteoporosis   . Pneumonia    recently, just finished ABO  . Pneumonia   . PONV (postoperative nausea and vomiting)   . Sleep apnea    CPAP  . Thyroid disease 2002  . Type II or unspecified type diabetes mellitus without mention of complication, not stated as uncontrolled   . Unspecified gastritis and gastroduodenitis without mention of hemorrhage     Past Surgical History:  Procedure Laterality Date  . ANAL RECTAL MANOMETRY N/A 08/05/2015   Procedure: ANO RECTAL MANOMETRY;  Surgeon: Mauri Pole, MD;  Location: [facility];  Service: Endoscopy;  Laterality: N/A;  . APPENDECTOMY    . BIOPSY  08/06/2020   Procedure: BIOPSY;  Surgeon: Eloise Harman, DO;  Location: [facility];  Service: Endoscopy;;  . BREAST LUMPECTOMY     left  . CARDIAC CATHETERIZATION    . CATARACT EXTRACTION W/PHACO Left 03/30/2013   Procedure: CATARACT EXTRACTION PHACO AND INTRAOCULAR LENS PLACEMENT (IOC);  Surgeon: Tonny Branch, MD;  Location: [facility];  Service: Ophthalmology;  Laterality: Left;  CDE:19.28  . CATARACT EXTRACTION W/PHACO Right 04/09/2013   Procedure: CATARACT EXTRACTION PHACO AND INTRAOCULAR LENS PLACEMENT (IOC);  Surgeon: Tonny Branch, MD;  Location: [facility];  Service: Ophthalmology;  Laterality: Right;  CDE: 15.44  . CERVICAL FUSION    . CESAREAN SECTION    . CHOLECYSTECTOMY    . CYSTOCELE REPAIR    . DILATION AND CURETTAGE OF UTERUS    . ESOPHAGOGASTRODUODENOSCOPY (EGD) WITH PROPOFOL N/A 08/06/2020   Procedure: ESOPHAGOGASTRODUODENOSCOPY (EGD) WITH PROPOFOL;  Surgeon: Eloise Harman, DO;  [facility];  Service: Endoscopy;  Laterality: N/A;  . EYE SURGERY Left 03-30-13   Cataract  . EYE SURGERY Right 04-09-13   Cataract  . RECTOCELE REPAIR    . THYROIDECTOMY    . TONSILLECTOMY    . TOTAL ABDOMINAL HYSTERECTOMY      Family History  Problem Relation Age of Onset  . Sarcoidosis Mother   . Diabetes Mother   . Cancer Mother        sarcoma -  Right arm amputation  . Hypertension Mother   . Lung cancer Father   . Diabetes Father   . Heart disease Father        Before age 50  . Urolithiasis Father   . Cancer Father        Lung  . Hyperlipidemia Father   . Hypertension Father   . Esophageal cancer Maternal Uncle   . Ovarian cancer Maternal Aunt   . Colon cancer Maternal Uncle   . Lung cancer Maternal Uncle   . Diabetes Maternal Uncle   . Diabetes Maternal Aunt   . Colon polyps Maternal Uncle   . Heart disease Paternal Grandmother     Social History   Tobacco Use  . Smoking status: Never Smoker  . Smokeless tobacco: Never Used  Vaping Use  . Vaping Use: Never used  Substance Use Topics  . Alcohol use: No  . Drug use: No    Prior to Admission medications   Medication Sig Start Date End Date Taking? Authorizing Provider  acetaminophen (TYLENOL) 500 MG tablet Take 1 tablet (500 mg total) by mouth every 8 (eight) hours as needed for moderate pain or headache. 09/29/20 09/29/21  Thurnell Lose, MD  amLODipine (NORVASC) 5 MG tablet Take 5 mg by mouth daily. 09/08/20   [provider]  aspirin 81 MG chewable tablet Chew 1 tablet (81 mg total) by mouth daily. 10/13/20   Thurnell Lose, MD  atorvastatin (LIPITOR) 20 MG tablet Take 1 tablet (20 mg total) by mouth daily. 10/17/12   Hillary Bow, MD  CALCIUM-MAGNESIUM-ZINC PO Take 3 tablets by mouth daily.    [provider]  Coenzyme Q10 (CO Q-10) 100 MG CAPS Take 100 mg by mouth daily.    [provider]  cyanocobalamin (,VITAMIN B-12,) 1000 MCG/ML injection INJECT 1 ML INTO MUSCLE ONCE A MONTH Patient taking differently: Inject 1,000 mcg into the muscle every 30 (thirty) days. INJECT 1 ML  INTO MUSCLE ONCE A MONTH 04/27/19   Mauri Pole, MD  doxepin (SINEQUAN) 10 MG capsule Take 10 mg by mouth at bedtime. 02/16/19   [provider]  DULoxetine (CYMBALTA) 60 MG capsule Take 60 mg by mouth daily.    [provider]  EPINEPHrine 0.3 mg/0.3 mL IJ SOAJ injection Inject 0.3 mg into the muscle as needed for anaphylaxis. 07/18/09   [provider]  fexofenadine (ALLEGRA) 30 MG tablet Take 30 mg by mouth daily.    [provider]  fluticasone (FLONASE) 50 MCG/ACT nasal spray Place 2 sprays into the nose daily.    [provider]  furosemide (LASIX) 40 MG tablet Take 1 tablet (40 mg total) by mouth 3 (three) times a week. Take on Monday/Wednesday/Friday 07/06/20   Richardson Dopp T, PA-C  gabapentin (NEURONTIN) 300 MG capsule Take 300 mg by mouth at bedtime.    [provider]  gentamicin (GARAMYCIN) 0.3 % ophthalmic solution Place 1 drop into both eyes 3 (three) times daily as needed (red or irritated eyes). 06/16/20   [provider]  Insulin Glargine-Lixisenatide 100-33 UNT-MCG/ML SOPN Inject 20-25 Units into the skin daily. Pt adjust between 20-25 units    [provider]  linaclotide (LINZESS) 290 MCG CAPS capsule Take 1 capsule (290 mcg total) by mouth daily. 10/17/20   Mauri Pole, MD  Magnesium Oxide 250 MG TABS Take 1 tablet (250 mg total) by mouth daily. 10/31/20   Johnson, Clanford L, MD  meclizine (ANTIVERT) 25 MG  tablet Take 1 tablet (25 mg total) by mouth 3 (three) times daily as needed for dizziness. 09/29/20   Thurnell Lose, MD  Melatonin 10 MG TABS Take 10 mg by mouth at bedtime as needed (sleep).    [provider]  metFORMIN (GLUCOPHAGE) 1000 MG tablet Take 1,000 mg by mouth 2 (two) times daily with a meal.    [provider]  metolazone (ZAROXOLYN) 5 MG tablet Take 2.5 mg by mouth daily.    [provider]  metoprolol succinate (TOPROL-XL) 50 MG 24 hr tablet TAKE 1 TABLET BY MOUTH EVERY DAY Patient taking differently: Take 50 mg by mouth daily. 07/08/20   Sherren Mocha, MD  montelukast (SINGULAIR) 10 MG tablet Take 10 mg by mouth at bedtime.    [provider]  Needles & Syringes MISC 25G 5/8 inch needle on 3 ml syringe-Use once monthly for B12 injections 07/20/15   Nandigam, Karleen Hampshire V, MD  nitroGLYCERIN (NITROSTAT) 0.4 MG SL tablet PLACE 1 TABLET (0.4 MG TOTAL) UNDER THE TONGUE EVERY 5 (FIVE) MINUTES AS NEEDED FOR CHEST PAIN. 12/03/19   Sherren Mocha, MD  Omega-3 Fatty Acids (FISH OIL) 1000 MG CAPS Take 1 capsule by mouth daily. 07/18/09   [provider]  ondansetron (ZOFRAN) 4 MG tablet Take 1 tablet (4 mg total) by mouth every 8 (eight) hours as needed for nausea or vomiting. 09/29/20   Thurnell Lose, MD  [facility] VERIO test strip 1 each by Other route as directed.  04/18/16   [provider]  pantoprazole (PROTONIX) 40 MG tablet Take 1 tablet (40 mg total) by mouth 2 (two) times daily. 08/07/20   Mariel Aloe, MD  pantoprazole (PROTONIX) 40 MG tablet Take 1 tablet (40 mg total) by mouth 2 (two) times daily. 10/17/20   Mauri Pole, MD  potassium chloride (KLOR-CON) 10 MEQ tablet Take 1 tablet (10 mEq total) by mouth daily for 15 days. 10/31/20 11/15/20  Wynetta Emery,  Clanford L, MD  ramipril (ALTACE) 5 MG capsule Take 1 capsule (5 mg total) by mouth at bedtime. 01/11/16   Sherren Mocha, MD  SYNTHROID 137 MCG tablet Take 137 mcg by  mouth daily. 09/30/20   [provider]    Current Facility-Administered Medications  Medication Dose Route Frequency Provider Last Rate Last Admin  . 0.9 %  sodium chloride infusion (Manually program via Guardrails IV Fluids)   Intravenous Once Deliah Boston, PA-C       Current Outpatient Medications  Medication Sig Dispense Refill  . acetaminophen (TYLENOL) 500 MG tablet Take 1 tablet (500 mg total) by mouth every 8 (eight) hours as needed for moderate pain or headache. 20 tablet 0  . amLODipine (NORVASC) 5 MG tablet Take 5 mg by mouth daily.    Marland Kitchen aspirin 81 MG chewable tablet Chew 1 tablet (81 mg total) by mouth daily.    Marland Kitchen atorvastatin (LIPITOR) 20 MG tablet Take 1 tablet (20 mg total) by mouth daily. 90 tablet 3  . CALCIUM-MAGNESIUM-ZINC PO Take 3 tablets by mouth daily.    . Coenzyme Q10 (CO Q-10) 100 MG CAPS Take 100 mg by mouth daily.    . cyanocobalamin (,VITAMIN B-12,) 1000 MCG/ML injection INJECT 1 ML INTO MUSCLE ONCE A MONTH (Patient taking differently: Inject 1,000 mcg into the muscle every 30 (thirty) days. INJECT 1 ML INTO MUSCLE ONCE A MONTH) 3 mL 3  . doxepin (SINEQUAN) 10 MG capsule Take 10 mg by mouth at bedtime.    . DULoxetine (CYMBALTA) 60 MG capsule Take 60 mg by mouth daily.    Marland Kitchen EPINEPHrine 0.3 mg/0.3 mL IJ SOAJ injection Inject 0.3 mg into the muscle as needed for anaphylaxis.    . fexofenadine (ALLEGRA) 30 MG tablet Take 30 mg by mouth daily.    . fluticasone (FLONASE) 50 MCG/ACT nasal spray Place 2 sprays into the nose daily.    . furosemide (LASIX) 40 MG tablet Take 1 tablet (40 mg total) by mouth 3 (three) times a week. Take on Monday/Wednesday/Friday 45 tablet 3  . gabapentin (NEURONTIN) 300 MG capsule Take 300 mg by mouth at bedtime.    Marland Kitchen gentamicin (GARAMYCIN) 0.3 % ophthalmic solution Place 1 drop into both eyes 3 (three) times daily as needed (red or irritated eyes).    . Insulin Glargine-Lixisenatide 100-33 UNT-MCG/ML SOPN Inject 20-25 Units  into the skin daily. Pt adjust between 20-25 units    . linaclotide (LINZESS) 290 MCG CAPS capsule Take 1 capsule (290 mcg total) by mouth daily. 90 capsule 1  . Magnesium Oxide 250 MG TABS Take 1 tablet (250 mg total) by mouth daily. 30 tablet 0  . meclizine (ANTIVERT) 25 MG tablet Take 1 tablet (25 mg total) by mouth 3 (three) times daily as needed for dizziness. 20 tablet 0  . Melatonin 10 MG TABS Take 10 mg by mouth at bedtime as needed (sleep).    . metFORMIN (GLUCOPHAGE) 1000 MG tablet Take 1,000 mg by mouth 2 (two) times daily with a meal.    . metolazone (ZAROXOLYN) 5 MG tablet Take 2.5 mg by mouth daily.    . metoprolol succinate (TOPROL-XL) 50 MG 24 hr tablet TAKE 1 TABLET BY MOUTH EVERY DAY (Patient taking differently: Take 50 mg by mouth daily.) 90 tablet 3  . montelukast (SINGULAIR) 10 MG tablet Take 10 mg by mouth at bedtime.    . Needles & Syringes MISC 25G 5/8 inch needle on 3 ml syringe-Use once monthly for  B12 injections 12 each 0  . nitroGLYCERIN (NITROSTAT) 0.4 MG SL tablet PLACE 1 TABLET (0.4 MG TOTAL) UNDER THE TONGUE EVERY 5 (FIVE) MINUTES AS NEEDED FOR CHEST PAIN. 25 tablet 5  . Omega-3 Fatty Acids (FISH OIL) 1000 MG CAPS Take 1 capsule by mouth daily.    . ondansetron (ZOFRAN) 4 MG tablet Take 1 tablet (4 mg total) by mouth every 8 (eight) hours as needed for nausea or vomiting. 20 tablet 0  . ONETOUCH VERIO test strip 1 each by Other route as directed.     . pantoprazole (PROTONIX) 40 MG tablet Take 1 tablet (40 mg total) by mouth 2 (two) times daily. 60 tablet 0  . pantoprazole (PROTONIX) 40 MG tablet Take 1 tablet (40 mg total) by mouth 2 (two) times daily. 120 tablet 3  . potassium chloride (KLOR-CON) 10 MEQ tablet Take 1 tablet (10 mEq total) by mouth daily for 15 days. 15 tablet 0  . ramipril (ALTACE) 5 MG capsule Take 1 capsule (5 mg total) by mouth at bedtime. 90 capsule 3  . SYNTHROID 137 MCG tablet Take 137 mcg by mouth daily.      Allergies as of 12/20/2020 -  Review Complete 12/20/2020  Allergen Reaction Noted  . Codeine Hives 04/27/2009  . Sulfonamide derivatives Hives 04/27/2009     Review of Systems:    Constitutional: No weight loss, fever or chills  Skin: No rash Cardiovascular: No chest pain Respiratory: No SOB  Gastrointestinal: See HPI and otherwise negative Genitourinary: No dysuria  Neurological: +dizziness Musculoskeletal: No new muscle or joint pain Hematologic: No bruising Psychiatric: No history of depression or anxiety    Physical Exam:  Vital signs in last 24 hours: Temp:  [97.6 F (36.4 C)-97.8 F (36.6 C)] 97.8 F (36.6 C) (03/15 1410) Pulse Rate:  [64-69] 69 (03/15 1449) Resp:  [16-18] 18 (03/15 1449) BP: (109-147)/(55-72) 137/58 (03/15 1449) SpO2:  [92 %-100 %] 92 % (03/15 1449) Weight:  [75.8 kg-76.7 kg] 75.8 kg (03/15 1341)   General:   Pleasant frail, pale appearing, elderly Caucasian female appears to be in NAD, Well developed, Well nourished, alert and cooperative Head:  Normocephalic and atraumatic. Eyes:   PEERL, EOMI. No icterus. Conjunctiva pink. Ears:  Normal auditory acuity. Neck:  Supple Throat: Oral cavity and pharynx without inflammation, swelling or lesion. Lungs: Respirations even and unlabored. Lungs clear to auscultation bilaterally.   No wheezes, crackles, or rhonchi.  Heart: Normal S1, S2. No MRG. Regular rate and rhythm. No peripheral edema, cyanosis or pallor.  Abdomen:  Soft, nondistended, nontender. No rebound or guarding. Normal bowel sounds. No appreciable masses or hepatomegaly. Rectal:  Not performed.  Msk:  Symmetrical without gross deformities. Peripheral pulses intact.  Extremities:  Without edema, no deformity or joint abnormality.  Neurologic:  Alert and  oriented x4;  grossly normal neurologically.  Skin:   Dry and intact without significant lesions or rashes. Psychiatric: Demonstrates good judgement and reason without abnormal affect or behaviors.   LAB RESULTS: Recent  Labs    12/20/20 1143  WBC 10.3  HGB 7.7 Repeated and verified X2.*  HCT 24.4 Repeated and verified X2.*  PLT 628.0*   BMET Recent Labs    12/20/20 1143  NA 135  K 3.6  CL 91*  CO2 34*  GLUCOSE 214*  BUN 18  CREATININE 1.08  CALCIUM 9.0   LFT Recent Labs    12/20/20 1143  PROT 7.0  ALBUMIN 3.8  AST 54*  ALT 26  ALKPHOS 42  BILITOT 0.8    Impression / Plan:   Impression: 1.  Melena: 1 episode of melena this morning, history of gastric ulcers in the past; most likely gastric ulcers 2.  Acute blood loss anemia: Drop in hemoglobin from 9.2 a month ago to 7.7 this morning down to 7.3 this afternoon, likely related to above 3.  Hypokalemia  Plan: 1.  We will schedule the patient for an EGD tomorrow with Dr. Tarri Glenn.  Did discuss risks, benefits, limitations and alternatives and the patient agrees to proceed. 2.  Continue to monitor hemoglobin every 8 hours with transfusion as needed less than 7 3.  Ordered potassium replacement 4.  Agree with 1 unit PRBCs now 5.  Patient to remain on clear liquids today and n.p.o. after midnight 6.  Started the patient on Pantoprazole 40 mg IV twice daily 7.  Please await any further recommendations from Dr. Tarri Glenn later today.  Thank you for your kind consultation, we will continue to follow.  Lavone Nian [facility]  12/20/2020, 3:08 PM

## 2020-12-20 NOTE — Progress Notes (Signed)
Pt. Admitted in the unit via wheelchair accompanied by ED staff. Pt. Is alert and oriented, Vital sign taken and recorded. Pt. Oriented to the room and call bell use. Needs attended to

## 2020-12-20 NOTE — ED Provider Notes (Signed)
[facility] DEPT Provider Note   CSN: [id number] Arrival date & time: 12/20/20  1332     History Chief Complaint  Patient presents with  . Abnormal Lab    Jeanne Kim is a 80 y.o. female history of gastric ulcers, anemia, CAD, esophageal strictures, hypertension, hypercholesterolemia, hypothyroidism, IBS, diabetes.  Patient sent in today by her gastroenterologist for concern of anemia and upper GI bleed.  Patient reports this morning she woke up and use the bathroom noticed dark tarry stools in the toilet.  She reports this is consistent with her known gastric ulcer bleeding.  She reports that today she has felt lightheaded worsened with standing improves with rest.  She went to her gastroenterologist today and they did labs and noticed her hemoglobin level was 7.7 and sent her to the ER for further treatment.  Denies fever/chills, fall/injury, syncope, chest pain/shortness of breath, numbness/tingling, weakness or any additional concerns. HPI     Past Medical History:  Diagnosis Date  . Allergy   . Anemia   . Anginal pain ([place])   . Anxiety   . Arthritis   . Blood transfusion without reported diagnosis   . Breast cancer ([place])   . Carotid artery plaque    bilat  . Cataract   . Colon polyps    adenomatous  . Concussion 02/2016   Driving restrictions for 6w  . Coronary heart disease   . Depression   . Diverticulosis   . Dizziness   . Esophageal reflux   . Esophageal stricture   . Fibromyalgia   . Gallstones   . Hiatal hernia   . Hypercholesterolemia   . Hypertension   . Hypothyroidism   . IBS (irritable bowel syndrome)   . Kidney stones   . Osteoporosis   . Pneumonia    recently, just finished ABO  . Pneumonia   . PONV (postoperative nausea and vomiting)   . Sleep apnea    CPAP  . Thyroid disease 2002  . Type II or unspecified type diabetes mellitus without mention of complication, not stated as uncontrolled   . Unspecified  gastritis and gastroduodenitis without mention of hemorrhage     Patient Active Problem List   Diagnosis Date Noted  . Hypokalemia due to excessive gastrointestinal loss of potassium 10/31/2020  . Hyperbilirubinemia 10/31/2020  . Thrombocytosis 10/31/2020  . Type 2 diabetes mellitus with hyperglycemia ([place]) 10/31/2020  . Abdominal pain 10/04/2020  . Fibromyalgia 10/04/2020  . Hyperglycemia due to type 2 diabetes mellitus ([place]) 10/04/2020  . Proctocolitis 10/04/2020  . Gastric ulcer 10/04/2020  . History of gastrointestinal bleeding 10/04/2020  . Intracranial bleed ([place]) 09/28/2020  . AKI (acute kidney injury) ([place]) 09/28/2020  . Upper GI bleed 08/05/2020  . Hypokalemia 03/25/2020  . Chronic pain 03/22/2020  . Hypomagnesemia 03/22/2020  . Insomnia 03/22/2020  . MDD (major depressive disorder) 03/22/2020  . Status post total replacement of left hip 03/21/2020  . S/P musculoskeletal system surgery 03/21/2020  . At risk for falls 02/24/2020  . Chronic, continuous use of opioids 02/24/2020  . Senile osteoporosis 02/24/2020  . History of radiation therapy 02/24/2020  . Elevated white blood cell count, unspecified 02/06/2020  . Microcytic anemia 02/05/2020  . Electrolyte abnormality 02/05/2020  . Lower extremity edema 02/04/2020  . Pathological fracture of left hip due to age-related osteoporosis ([place]) 02/04/2020  . History of fracture of left hip 02/03/2020  . Fecal incontinence   . Swelling of limb-Bilateral leg 04/29/2014  . Weakness  of both legs 04/29/2014  . Dizziness and giddiness 04/29/2014  . Malignant neoplasm of breast ([place]) 07/02/2013  . Occlusion and stenosis of carotid artery without mention of cerebral infarction 04/28/2013  . Carotid stenosis 01/20/2013  . Autonomic orthostatic hypotension 10/17/2012  . Postural hypotension 10/17/2012  . Dyspnea 08/27/2012  . Syncope 02/17/2012  . Head trauma 02/14/2012  . Diaphoresis 02/05/2012  . Unspecified constipation  12/12/2011  . Personal history of colonic polyps 12/12/2011  . Family history of malignant neoplasm of gastrointestinal tract 12/12/2011  . Diverticulosis of colon 12/12/2011  . Gastritis 12/12/2011  . GERD with stricture 12/12/2011  . Gastroesophageal reflux disease 11/27/2011  . Constipation 11/27/2011  . IBS (irritable bowel syndrome) 11/27/2011  . Bacterial overgrowth syndrome 11/27/2011  . DM (diabetes mellitus) ([place]) 11/27/2011  . S/P cholecystectomy 11/27/2011  . Adult hypothyroidism 07/13/2011  . ADENOCARCINOMA, LEFT BREAST 12/09/2009  . CHEST PAIN-PRECORDIAL 12/09/2009  . History of breast cancer 12/09/2009  . Hypothyroidism 04/27/2009  . DIABETES MELLITUS 04/07/2009  . HYPERCHOLESTEROLEMIA  IIA 04/07/2009  . OVERWEIGHT/OBESITY 04/07/2009  . Obstructive sleep apnea 04/07/2009  . Benign essential HTN 04/07/2009  . CAD (coronary artery disease) 04/07/2009  . Irritable bowel syndrome 04/07/2009  . DEGENERATIVE JOINT DISEASE 04/07/2009  . DEGENERATIVE DISC DISEASE 04/07/2009  . FIBROMYALGIA 04/07/2009  . Type 2 diabetes mellitus ([place]) 05/04/2008  . Thyroid nodule 05/04/2008    Past Surgical History:  Procedure Laterality Date  . ANAL RECTAL MANOMETRY N/A 08/05/2015   Procedure: ANO RECTAL MANOMETRY;  Surgeon: Mauri Pole, MD;  Location: [facility];  Service: Endoscopy;  Laterality: N/A;  . APPENDECTOMY    . BIOPSY  08/06/2020   Procedure: BIOPSY;  Surgeon: Eloise Harman, DO;  Location: [facility];  Service: Endoscopy;;  . BREAST LUMPECTOMY     left  . CARDIAC CATHETERIZATION    . CATARACT EXTRACTION W/PHACO Left 03/30/2013   Procedure: CATARACT EXTRACTION PHACO AND INTRAOCULAR LENS PLACEMENT (IOC);  Surgeon: Tonny Branch, MD;  Location: [facility] ORS;  Service: Ophthalmology;  Laterality: Left;  CDE:19.28  . CATARACT EXTRACTION W/PHACO Right 04/09/2013   Procedure: CATARACT EXTRACTION PHACO AND INTRAOCULAR LENS PLACEMENT (IOC);  Surgeon: Tonny Branch, MD;  Location:  [facility];  Service: Ophthalmology;  Laterality: Right;  CDE: 15.44  . CERVICAL FUSION    . CESAREAN SECTION    . CHOLECYSTECTOMY    . CYSTOCELE REPAIR    . DILATION AND CURETTAGE OF UTERUS    . ESOPHAGOGASTRODUODENOSCOPY (EGD) WITH PROPOFOL N/A 08/06/2020   Procedure: ESOPHAGOGASTRODUODENOSCOPY (EGD) WITH PROPOFOL;  Surgeon: Eloise Harman, DO;  Location: [facility];  Service: Endoscopy;  Laterality: N/A;  . EYE SURGERY Left 03-30-13   Cataract  . EYE SURGERY Right 04-09-13   Cataract  . RECTOCELE REPAIR    . THYROIDECTOMY    . TONSILLECTOMY    . TOTAL ABDOMINAL HYSTERECTOMY       OB History   No obstetric history on file.     Family History  Problem Relation Age of Onset  . Sarcoidosis Mother   . Diabetes Mother   . Cancer Mother        sarcoma -  Right arm amputation  . Hypertension Mother   . Lung cancer Father   . Diabetes Father   . Heart disease Father        Before age 48  . Urolithiasis Father   . Cancer Father        Lung  . Hyperlipidemia Father   .  Hypertension Father   . Esophageal cancer Maternal Uncle   . Ovarian cancer Maternal Aunt   . Colon cancer Maternal Uncle   . Lung cancer Maternal Uncle   . Diabetes Maternal Uncle   . Diabetes Maternal Aunt   . Colon polyps Maternal Uncle   . Heart disease Paternal Grandmother     Social History   Tobacco Use  . Smoking status: Never Smoker  . Smokeless tobacco: Never Used  Vaping Use  . Vaping Use: Never used  Substance Use Topics  . Alcohol use: No  . Drug use: No    Home Medications Prior to Admission medications   Medication Sig Start Date End Date Taking? Authorizing Provider  acetaminophen (TYLENOL) 500 MG tablet Take 1 tablet (500 mg total) by mouth every 8 (eight) hours as needed for moderate pain or headache. 09/29/20 09/29/21  Thurnell Lose, MD  amLODipine (NORVASC) 5 MG tablet Take 5 mg by mouth daily. 09/08/20   [provider]  aspirin 81 MG chewable tablet Chew 1  tablet (81 mg total) by mouth daily. 10/13/20   Thurnell Lose, MD  atorvastatin (LIPITOR) 20 MG tablet Take 1 tablet (20 mg total) by mouth daily. 10/17/12   Hillary Bow, MD  CALCIUM-MAGNESIUM-ZINC PO Take 3 tablets by mouth daily.    [provider]  Coenzyme Q10 (CO Q-10) 100 MG CAPS Take 100 mg by mouth daily.    [provider]  cyanocobalamin (,VITAMIN B-12,) 1000 MCG/ML injection INJECT 1 ML INTO MUSCLE ONCE A MONTH Patient taking differently: Inject 1,000 mcg into the muscle every 30 (thirty) days. INJECT 1 ML INTO MUSCLE ONCE A MONTH 04/27/19   Mauri Pole, MD  doxepin (SINEQUAN) 10 MG capsule Take 10 mg by mouth at bedtime. 02/16/19   [provider]  DULoxetine (CYMBALTA) 60 MG capsule Take 60 mg by mouth daily.    [provider]  EPINEPHrine 0.3 mg/0.3 mL IJ SOAJ injection Inject 0.3 mg into the muscle as needed for anaphylaxis. 07/18/09   [provider]  fexofenadine (ALLEGRA) 30 MG tablet Take 30 mg by mouth daily.    [provider]  fluticasone (FLONASE) 50 MCG/ACT nasal spray Place 2 sprays into the nose daily.    [provider]  furosemide (LASIX) 40 MG tablet Take 1 tablet (40 mg total) by mouth 3 (three) times a week. Take on Monday/Wednesday/Friday 07/06/20   Richardson Dopp T, PA-C  gabapentin (NEURONTIN) 300 MG capsule Take 300 mg by mouth at bedtime.    [provider]  gentamicin (GARAMYCIN) 0.3 % ophthalmic solution Place 1 drop into both eyes 3 (three) times daily as needed (red or irritated eyes). 06/16/20   [provider]  Insulin Glargine-Lixisenatide 100-33 UNT-MCG/ML SOPN Inject 20-25 Units into the skin daily. Pt adjust between 20-25 units    [provider]  linaclotide (LINZESS) 290 MCG CAPS capsule Take 1 capsule (290 mcg total) by mouth daily. 10/17/20   Mauri Pole, MD  Magnesium Oxide 250 MG TABS Take 1 tablet (250 mg total) by mouth daily. 10/31/20    Johnson, Clanford L, MD  meclizine (ANTIVERT) 25 MG tablet Take 1 tablet (25 mg total) by mouth 3 (three) times daily as needed for dizziness. 09/29/20   Thurnell Lose, MD  Melatonin 10 MG TABS Take 10 mg by mouth at bedtime as needed (sleep).    [provider]  metFORMIN (GLUCOPHAGE) 1000 MG tablet Take 1,000 mg by  mouth 2 (two) times daily with a meal.    [provider]  metolazone (ZAROXOLYN) 5 MG tablet Take 2.5 mg by mouth daily.    [provider]  metoprolol succinate (TOPROL-XL) 50 MG 24 hr tablet TAKE 1 TABLET BY MOUTH EVERY DAY Patient taking differently: Take 50 mg by mouth daily. 07/08/20   Sherren Mocha, MD  montelukast (SINGULAIR) 10 MG tablet Take 10 mg by mouth at bedtime.    [provider]  Needles & Syringes MISC 25G 5/8 inch needle on 3 ml syringe-Use once monthly for B12 injections 07/20/15   Nandigam, Karleen Hampshire V, MD  nitroGLYCERIN (NITROSTAT) 0.4 MG SL tablet PLACE 1 TABLET (0.4 MG TOTAL) UNDER THE TONGUE EVERY 5 (FIVE) MINUTES AS NEEDED FOR CHEST PAIN. 12/03/19   Sherren Mocha, MD  Omega-3 Fatty Acids (FISH OIL) 1000 MG CAPS Take 1 capsule by mouth daily. 07/18/09   [provider]  ondansetron (ZOFRAN) 4 MG tablet Take 1 tablet (4 mg total) by mouth every 8 (eight) hours as needed for nausea or vomiting. 09/29/20   Thurnell Lose, MD  [facility] VERIO test strip 1 each by Other route as directed.  04/18/16   [provider]  pantoprazole (PROTONIX) 40 MG tablet Take 1 tablet (40 mg total) by mouth 2 (two) times daily. 08/07/20   Mariel Aloe, MD  pantoprazole (PROTONIX) 40 MG tablet Take 1 tablet (40 mg total) by mouth 2 (two) times daily. 10/17/20   Mauri Pole, MD  potassium chloride (KLOR-CON) 10 MEQ tablet Take 1 tablet (10 mEq total) by mouth daily for 15 days. 10/31/20 11/15/20  Johnson, Clanford L, MD  ramipril (ALTACE) 5 MG capsule Take 1 capsule (5 mg total) by mouth at bedtime. 01/11/16   Sherren Mocha, MD  SYNTHROID 137 MCG tablet Take 137 mcg by mouth daily. 09/30/20   [provider]    Allergies    Codeine, Sulfonamide derivatives, Canagliflozin, and Sulfa antibiotics  Review of Systems   Review of Systems Ten systems are reviewed and are negative for acute change except as noted in the HPI  Physical Exam Updated Vital Signs BP (!) 137/58 (BP Location: Right Arm)   Pulse 69   Temp 97.8 F (36.6 C) (Oral)   Resp 18   Ht 5\' 4"  (1.626 m)   Wt 75.8 kg   SpO2 92%   BMI 28.67 kg/m   Physical Exam Constitutional:      General: She is not in acute distress.    Appearance: Normal appearance. She is well-developed. She is not ill-appearing or diaphoretic.  HENT:     Head: Normocephalic and atraumatic.  Eyes:     General: Vision grossly intact. Gaze aligned appropriately.     Pupils: Pupils are equal, round, and reactive to light.  Neck:     Trachea: Trachea and phonation normal.  Pulmonary:     Effort: Pulmonary effort is normal. No respiratory distress.  Abdominal:     General: There is no distension.     Palpations: Abdomen is soft.     Tenderness: There is no abdominal tenderness. There is no guarding or rebound.  Genitourinary:    Comments: Nonthrombosed external hemorrhoid present.  No gross blood.  No palpable stool within the rectal vault.  No stool or blood obtained for Hemoccult. Musculoskeletal:        General: Normal range of motion.     Cervical back: Normal range of motion.  Skin:  General: Skin is warm and dry.  Neurological:     Mental Status: She is alert.     GCS: GCS eye subscore is 4. GCS verbal subscore is 5. GCS motor subscore is 6.     Comments: Speech is clear and goal oriented, follows commands Major Cranial nerves without deficit, no facial droop Moves extremities without ataxia, coordination intact  Psychiatric:        Behavior: Behavior normal.     ED Results / Procedures / Treatments   Labs (all labs ordered are  listed, but only abnormal results are displayed) Labs Reviewed  CBC WITH DIFFERENTIAL/PLATELET - Abnormal; Notable for the following components:      Result Value   RBC 3.45 (*)    Hemoglobin 7.3 (*)    HCT 25.6 (*)    MCV 74.2 (*)    MCH 21.2 (*)    MCHC 28.5 (*)    RDW 19.1 (*)    Platelets 628 (*)    All other components within normal limits  COMPREHENSIVE METABOLIC PANEL  LIPASE, BLOOD  POC OCCULT BLOOD, ED  TYPE AND SCREEN  PREPARE RBC (CROSSMATCH)  TROPONIN I (HIGH SENSITIVITY)    EKG None  Radiology No results found.  Procedures .Critical Care Performed by: Deliah Boston, PA-C Authorized by: Deliah Boston, PA-C   Critical care provider statement:    Critical care time (minutes):  40   Critical care was necessary to treat or prevent imminent or life-threatening deterioration of the following conditions: Symptomatic anemia.   Critical care was time spent personally by me on the following activities:  Discussions with consultants, evaluation of patient's response to treatment, examination of patient, ordering and performing treatments and interventions, ordering and review of laboratory studies, ordering and review of radiographic studies, pulse oximetry, re-evaluation of patient's condition, obtaining history from patient or surrogate, review of old charts and development of treatment plan with patient or surrogate     Medications Ordered in ED Medications  0.9 %  sodium chloride infusion (Manually program via Guardrails IV Fluids) (has no administration in time range)  sodium chloride 0.9 % bolus 500 mL (500 mLs Intravenous New Bag/Given 12/20/20 1431)  pantoprazole (PROTONIX) injection 40 mg (40 mg Intravenous Given 12/20/20 1431)    ED Course  I have reviewed the triage vital signs and the nursing notes.  Pertinent labs & imaging results that were available during my care of the patient were reviewed by me and considered in my medical decision making  (see chart for details).  Clinical Course as of 12/20/20 1519  Tue Dec 20, 2020  1446 Anderson Malta [facility] [BM]    Clinical Course User Index [BM] Gari Crown   MDM Rules/Calculators/A&P                         Additional history obtained from: 1. Nursing notes from this visit. 2. Review of EMR. ------------------ 80 year old female history as above presented for melena onset today as well as some dizziness.  Noted to be anemic at her gastroenterologist office and sent to the ER for further treatment.  Denies chest pain or shortness of breath with nausea/vomiting or falls/injuries.  Patient denies any abdominal pain reports she is otherwise feeling well.  Basic labs including CBC, CMP, lipase and type and screen/Hemoccult ordered.  EKG ordered for evaluation of lightheadedness.  Rectal examination did not show any bleeding however no stool in the rectal vault for collection. ------------------  2:46 PM: Consult with [facility] gastroenterology [facility].  Agrees with Protonix and asked for medicine admission they would by to see patient; anticipate endoscopy for further evaluation. - I ordered, reviewed and interpreted labs which include: CMP shows anemia of 7.3, no leukocytosis. Hemoccult negative. CMP shows hypokalemia 3.1, slight elevation of creatinine at 1.11, no emergent LFT elevations or gap.  1 unit blood transfusion ordered.  Risks versus benefits were discussed with patient and consent was obtained. - Patient reassessed remained stable on room air no acute distress states understanding of care plan she is agreeable for admission - Consult with Dr. Lupita Leash, patient accepted to medicine service. - 3:30 PM: In person consult with  GI, they agree with 1 unit blood transfusion at this time.  No other recommendations currently  Note: Portions of this report may have been transcribed using voice recognition software. Every effort was made to ensure accuracy; however,  inadvertent computerized transcription errors may still be present. Final Clinical Impression(s) / ED Diagnoses Final diagnoses:  Symptomatic anemia  Gastrointestinal hemorrhage, unspecified gastrointestinal hemorrhage type    Rx / DC Orders ED Discharge Orders    None       Gari Crown 12/20/20 1531    Milton Ferguson, MD 12/24/20 1540

## 2020-12-20 NOTE — ED Triage Notes (Signed)
Patient reports that she has a Hgb 7.7. Patient states she had black tarry stools this AM.

## 2020-12-20 NOTE — H&P (Signed)
History and Physical    Jeanne Kim ZOX:[id number] DOB: 1940-12-02 DOA: 12/20/2020  PCP: Neale Burly, MD   Patient coming from:   Chief Complaint  Patient presents with  . Abnormal Lab     HPI: Jeanne Kim is a 80 y.o. female with medical history significant for CAD/HTN/HLD/carotid artery disease, T2DM, recent hospitalization for syncopal episode with traumatic subdural hematoma, history of nonbleeding gastric ulcer and erosive esophagitis H. pylori negative on EGD 08/06/2020 was sent to the ED from GI clinic due to low hemoglobin and black tarry stool. Patient was seen at the GI clinic for rectal bleeding and mucus when she was hospitalized with sepsis that had resolved and morning noticed dark tarry stool with mucus similar to what she had when hospitalized in October of last year with a stomach ulcer.  She also complained of being weak and having dizziness.  She had routine blood work CBC CMP done in the clinic that showed hemoglobin significantly lower 7.7 g from 9.2 a month ago and was sent to the ED. On my evaluation -she appears pale weak. Patient otherwise denies any nausea, vomiting, chest pain, shortness of breath, fever, chills, headache, focal weakness, numbness tingling, speech difficulties. She has mild vague abdomen discomfort.  ED Course: Vitals are stable saturating well on room air, lab work further shows potassium 3.1 blood sugar 255, total bili 1.6 hemoglobin down to 7.3 g with microcytosis thrombocytosis.  1 unit PRBC transfusion was ordered, GI was consulted with plan for EGD tomorrow and admission was requested.  Review of Systems: All systems were reviewed and were negative except as mentioned in HPI above. Negative for fever Negative for chest pain Negative for shortness of breath  Past Medical History:  Diagnosis Date  . Allergy   . Anemia   . Anginal pain ([place])   . Anxiety   . Arthritis   . Blood transfusion without reported diagnosis   . Breast  cancer ([place])   . Carotid artery plaque    bilat  . Cataract   . Colon polyps    adenomatous  . Concussion 02/2016   Driving restrictions for 6w  . Coronary heart disease   . Depression   . Diverticulosis   . Dizziness   . Esophageal reflux   . Esophageal stricture   . Fibromyalgia   . Gallstones   . Hiatal hernia   . Hypercholesterolemia   . Hypertension   . Hypothyroidism   . IBS (irritable bowel syndrome)   . Kidney stones   . Osteoporosis   . Pneumonia    recently, just finished ABO  . Pneumonia   . PONV (postoperative nausea and vomiting)   . Sleep apnea    CPAP  . Thyroid disease 2002  . Type II or unspecified type diabetes mellitus without mention of complication, not stated as uncontrolled   . Unspecified gastritis and gastroduodenitis without mention of hemorrhage     Past Surgical History:  Procedure Laterality Date  . ANAL RECTAL MANOMETRY N/A 08/05/2015   Procedure: ANO RECTAL MANOMETRY;  Surgeon: Mauri Pole, MD;  Location: [facility];  Service: Endoscopy;  Laterality: N/A;  . APPENDECTOMY    . BIOPSY  08/06/2020   Procedure: BIOPSY;  Surgeon: Eloise Harman, DO;  Location: [facility];  Service: Endoscopy;;  . BREAST LUMPECTOMY     left  . CARDIAC CATHETERIZATION    . CATARACT EXTRACTION W/PHACO Left 03/30/2013   Procedure: CATARACT EXTRACTION PHACO AND INTRAOCULAR  LENS PLACEMENT (IOC);  Surgeon: Tonny Branch, MD;  Location: [facility];  Service: Ophthalmology;  Laterality: Left;  CDE:19.28  . CATARACT EXTRACTION W/PHACO Right 04/09/2013   Procedure: CATARACT EXTRACTION PHACO AND INTRAOCULAR LENS PLACEMENT (IOC);  Surgeon: Tonny Branch, MD;  Location: [facility];  Service: Ophthalmology;  Laterality: Right;  CDE: 15.44  . CERVICAL FUSION    . CESAREAN SECTION    . CHOLECYSTECTOMY    . CYSTOCELE REPAIR    . DILATION AND CURETTAGE OF UTERUS    . ESOPHAGOGASTRODUODENOSCOPY (EGD) WITH PROPOFOL N/A 08/06/2020   Procedure: ESOPHAGOGASTRODUODENOSCOPY (EGD)  WITH PROPOFOL;  Surgeon: Eloise Harman, DO;  Location: [facility];  Service: Endoscopy;  Laterality: N/A;  . EYE SURGERY Left 03-30-13   Cataract  . EYE SURGERY Right 04-09-13   Cataract  . RECTOCELE REPAIR    . THYROIDECTOMY    . TONSILLECTOMY    . TOTAL ABDOMINAL HYSTERECTOMY       reports that she has never smoked. She has never used smokeless tobacco. She reports that she does not drink alcohol and does not use drugs.  Allergies  Allergen Reactions  . Codeine Hives  . Sulfonamide Derivatives Hives  . Canagliflozin   . Penicillins Hives  . Sulfa Antibiotics Other (See Comments)    unknown    Family History  Problem Relation Age of Onset  . Sarcoidosis Mother   . Diabetes Mother   . Cancer Mother        sarcoma -  Right arm amputation  . Hypertension Mother   . Lung cancer Father   . Diabetes Father   . Heart disease Father        Before age 65  . Urolithiasis Father   . Cancer Father        Lung  . Hyperlipidemia Father   . Hypertension Father   . Esophageal cancer Maternal Uncle   . Ovarian cancer Maternal Aunt   . Colon cancer Maternal Uncle   . Lung cancer Maternal Uncle   . Diabetes Maternal Uncle   . Diabetes Maternal Aunt   . Colon polyps Maternal Uncle   . Heart disease Paternal Grandmother      Prior to Admission medications   Medication Sig Start Date End Date Taking? Authorizing Provider  acetaminophen (TYLENOL) 500 MG tablet Take 1 tablet (500 mg total) by mouth every 8 (eight) hours as needed for moderate pain or headache. 09/29/20 09/29/21  Thurnell Lose, MD  amLODipine (NORVASC) 5 MG tablet Take 5 mg by mouth daily. 09/08/20   [provider]  aspirin 81 MG chewable tablet Chew 1 tablet (81 mg total) by mouth daily. 10/13/20   Thurnell Lose, MD  atorvastatin (LIPITOR) 20 MG tablet Take 1 tablet (20 mg total) by mouth daily. 10/17/12   Hillary Bow, MD  CALCIUM-MAGNESIUM-ZINC PO Take 3 tablets by mouth daily.    [provider]  Coenzyme Q10 (CO Q-10) 100 MG CAPS Take 100 mg by mouth daily.    [provider]  cyanocobalamin (,VITAMIN B-12,) 1000 MCG/ML injection INJECT 1 ML INTO MUSCLE ONCE A MONTH Patient taking differently: Inject 1,000 mcg into the muscle every 30 (thirty) days. INJECT 1 ML INTO MUSCLE ONCE A MONTH 04/27/19   Mauri Pole, MD  doxepin (SINEQUAN) 10 MG capsule Take 10 mg by mouth at bedtime. 02/16/19   [provider]  DULoxetine (CYMBALTA) 60 MG capsule Take 60 mg by mouth daily.  [provider]  EPINEPHrine 0.3 mg/0.3 mL IJ SOAJ injection Inject 0.3 mg into the muscle as needed for anaphylaxis. 07/18/09   [provider]  fexofenadine (ALLEGRA) 30 MG tablet Take 30 mg by mouth daily.    [provider]  fluticasone (FLONASE) 50 MCG/ACT nasal spray Place 2 sprays into the nose daily.    [provider]  furosemide (LASIX) 40 MG tablet Take 1 tablet (40 mg total) by mouth 3 (three) times a week. Take on Monday/Wednesday/Friday 07/06/20   Richardson Dopp T, PA-C  gabapentin (NEURONTIN) 300 MG capsule Take 300 mg by mouth at bedtime.    [provider]  gentamicin (GARAMYCIN) 0.3 % ophthalmic solution Place 1 drop into both eyes 3 (three) times daily as needed (red or irritated eyes). 06/16/20   [provider]  Insulin Glargine-Lixisenatide 100-33 UNT-MCG/ML SOPN Inject 20-25 Units into the skin daily. Pt adjust between 20-25 units    [provider]  linaclotide (LINZESS) 290 MCG CAPS capsule Take 1 capsule (290 mcg total) by mouth daily. 10/17/20   Mauri Pole, MD  Magnesium Oxide 250 MG TABS Take 1 tablet (250 mg total) by mouth daily. 10/31/20   Johnson, Clanford L, MD  meclizine (ANTIVERT) 25 MG tablet Take 1 tablet (25 mg total) by mouth 3 (three) times daily as needed for dizziness. 09/29/20   Thurnell Lose, MD  Melatonin 10 MG TABS Take 10 mg by mouth at bedtime as needed (sleep).     [provider]  metFORMIN (GLUCOPHAGE) 1000 MG tablet Take 1,000 mg by mouth 2 (two) times daily with a meal.    [provider]  metolazone (ZAROXOLYN) 5 MG tablet Take 2.5 mg by mouth daily.    [provider]  metoprolol succinate (TOPROL-XL) 50 MG 24 hr tablet TAKE 1 TABLET BY MOUTH EVERY DAY Patient taking differently: Take 50 mg by mouth daily. 07/08/20   Sherren Mocha, MD  montelukast (SINGULAIR) 10 MG tablet Take 10 mg by mouth at bedtime.    [provider]  Needles & Syringes MISC 25G 5/8 inch needle on 3 ml syringe-Use once monthly for B12 injections 07/20/15   Nandigam, Karleen Hampshire V, MD  nitroGLYCERIN (NITROSTAT) 0.4 MG SL tablet PLACE 1 TABLET (0.4 MG TOTAL) UNDER THE TONGUE EVERY 5 (FIVE) MINUTES AS NEEDED FOR CHEST PAIN. 12/03/19   Sherren Mocha, MD  Omega-3 Fatty Acids (FISH OIL) 1000 MG CAPS Take 1 capsule by mouth daily. 07/18/09   [provider]  ondansetron (ZOFRAN) 4 MG tablet Take 1 tablet (4 mg total) by mouth every 8 (eight) hours as needed for nausea or vomiting. 09/29/20   Thurnell Lose, MD  [facility] VERIO test strip 1 each by Other route as directed.  04/18/16   [provider]  pantoprazole (PROTONIX) 40 MG tablet Take 1 tablet (40 mg total) by mouth 2 (two) times daily. 08/07/20   Mariel Aloe, MD  pantoprazole (PROTONIX) 40 MG tablet Take 1 tablet (40 mg total) by mouth 2 (two) times daily. 10/17/20   Mauri Pole, MD  potassium chloride (KLOR-CON) 10 MEQ tablet Take 1 tablet (10 mEq total) by mouth daily for 15 days. 10/31/20 11/15/20  Johnson, Clanford L, MD  ramipril (ALTACE) 5 MG capsule Take 1 capsule (5 mg total) by mouth at bedtime. 01/11/16   Sherren Mocha, MD  SYNTHROID 137 MCG tablet Take 137 mcg by mouth daily. 09/30/20   [provider]    Physical  Exam: Vitals:   12/20/20 1341 12/20/20 1410 12/20/20 1449  BP: (!) 147/72 (!) 109/55 (!) 137/58  Pulse: 69 65 69  Resp: 16 18 18    Temp: 97.6 F (36.4 C) 97.8 F (36.6 C)   TempSrc: Oral Oral   SpO2: 100% 98% 92%  Weight: 75.8 kg    Height: 5\' 4"  (1.626 m)     General exam: AAOx3,pale, weak appearing. HEENT:Oral mucosa moist, Ear/Nose WNL grossly, dentition normal. Respiratory system: bilaterally clear,no wheezing or crackles,no use of accessory muscle Cardiovascular system: S1 & S2 +, No JVD,. Gastrointestinal system: Abdomen soft, NT,ND, BS+ Nervous System:Alert, awake, moving extremities and grossly nonfocal Extremities: No edema, distal peripheral pulses palpable.  Skin: No rashes,no icterus. MSK: Normal muscle bulk,tone, power   Labs on Admission: I have personally reviewed following labs and imaging studies  CBC: Recent Labs  Lab 12/20/20 1143 12/20/20 1415  WBC 10.3 9.2  NEUTROABS 6.8 5.9  HGB 7.7 Repeated and verified X2.* 7.3*  HCT 24.4 Repeated and verified X2.* 25.6*  MCV 68.9 Repeated and verified X2.* 74.2*  PLT 628.0* 998*   Basic Metabolic Panel: Recent Labs  Lab 12/20/20 1143 12/20/20 1415  NA 135 136  K 3.6 3.1*  CL 91* 93*  CO2 34* 29  GLUCOSE 214* 255*  BUN 18 20  CREATININE 1.08 1.11*  CALCIUM 9.0 9.1   GFR: Estimated Creatinine Clearance: 42.3 mL/min (A) (by C-G formula based on SCr of 1.11 mg/dL (H)). Liver Function Tests: Recent Labs  Lab 12/20/20 1143 12/20/20 1415  AST 54* 66*  ALT 26 33  ALKPHOS 42 42  BILITOT 0.8 1.6*  PROT 7.0 6.8  ALBUMIN 3.8 3.7   Recent Labs  Lab 12/20/20 1415  LIPASE 23   No results for input(s): AMMONIA in the last 168 hours. Coagulation Profile: No results for input(s): INR, PROTIME in the last 168 hours. Cardiac Enzymes: No results for input(s): CKTOTAL, CKMB, CKMBINDEX, TROPONINI in the last 168 hours. BNP (last 3 results) Recent Labs    07/04/20 1524  PROBNP 165   HbA1C: No results for input(s): HGBA1C in the last 72 hours. CBG: No results for input(s): GLUCAP in the last 168 hours. Lipid Profile: No results  for input(s): CHOL, HDL, LDLCALC, TRIG, CHOLHDL, LDLDIRECT in the last 72 hours. Thyroid Function Tests: No results for input(s): TSH, T4TOTAL, FREET4, T3FREE, THYROIDAB in the last 72 hours. Anemia Panel: No results for input(s): VITAMINB12, FOLATE, FERRITIN, TIBC, IRON, RETICCTPCT in the last 72 hours. Urine analysis:    Component Value Date/Time   COLORURINE YELLOW 09/28/2020 [place] 09/28/2020 0427   LABSPEC 1.019 09/28/2020 0427   PHURINE 5.0 09/28/2020 0427   GLUCOSEU NEGATIVE 09/28/2020 0427   HGBUR SMALL (A) 09/28/2020 0427   BILIRUBINUR NEGATIVE 09/28/2020 0427   KETONESUR NEGATIVE 09/28/2020 0427   PROTEINUR NEGATIVE 09/28/2020 0427   NITRITE NEGATIVE 09/28/2020 0427   LEUKOCYTESUR NEGATIVE 09/28/2020 0427    Radiological Exams on Admission: No results found.   Assessment/Plan  Symptomatic anemia with hemoglobin 7.3 g Black tarry stool with hx of gastric ulcer and erosive esophagitis in EGD 07/2020: Presentation concerning for acute upper GI bleeding we will admit the patient, GI is already on board keep on clear liquid diet IV PPI BID, and n.p.o. past midnight. Check serial h/h.Avoid NSAIDs anticoagulants or aspirin for now. Watch BP and may need to hold off her antihypertensive.  Hypokalemia will order replacement,repeat bmp in am  CAD/HTN/HLD/carotid artery disease: Blood pressure  is controlled, patient has no chest pain, troponin negative at 2, EKG in the ED shows sinus rhythm abnormal R wave progression old infarct.  Hold home aspirin 81 mg, resume amlodipine 5 mg, metoprolol,lasix,Lipitor 20 mg coenzyme Q/fish oil.  Hold ACE inhibitor for now.  T2DM: Hold Metformin add every 6 hours sliding scale insulin and monitor CBG.  Resume insulin glargine at lower dose-at home on 20-25 units daily. check hemoglobin A1c  Mildly elevated AST- monitor  Hypothyroidism: Continue Synthroid  IBS resume linaclotide  Fibromyalgia/chronic  pain/anxiety/depression: Resume home doxepin, Cymbalta, Neurontin.  Thrombocytosis: Suspect reactive monitor. Needs OP follow-up until resolution  Body mass index is 28.67 kg/m.   Severity of Illness: The appropriate patient status for this patient is OBSERVATION. Observation status is judged to be reasonable and necessary in order to provide the required intensity of service to ensure the patient's safety. The patient's presenting symptoms of dizziness, physical exam findings/initial radiographic and laboratory data showing anemia and concern for GI bleeding in the context of their medical condition is felt to place them at decreased risk for further clinical deterioration. Furthermore, it is anticipated that the patient will be medically stable for discharge from the hospital within 2 midnights of admission.  DVT prophylaxis: SCD Code Status:   Code Status: Full Code  Family Communication: Admission, patients condition and plan of care including tests being ordered have been discussed with the patient who indicate understanding and agree with the plan and Code Status.  Consults called:  GI  Antonieta Pert MD [facility]  If 7PM-7AM, please contact night-coverage [email or web address]  12/20/2020, 4:14 PM

## 2020-12-20 NOTE — H&P (View-Only) (Signed)
Consultation  Referring Provider: Dr. Roderic Palau    Primary Care Physician:  Neale Burly, MD Primary Gastroenterologist: Dr. Silverio Decamp       Reason for Consultation: Melena            HPI:   Jeanne Kim is a 80 y.o. female with a past medical history of CAD, type 2 diabetes, hypertension, hyperlipidemia and recently hospitalized for syncopal episode with a traumatic subdural hematoma in January 2022, who presents to the ER today per recommendations from our outpatient clinic for dark tarry stool.    Patient was seen in our outpatient clinic today, please see that note from Dr. Silverio Decamp for details, but essentially patient described having rectal bleeding and mucus when she was hospitalized with sepsis which resolved, but this morning noticed really dark tarry stools with mucus similar to what she had when hospitalized in October of last year with stomach ulcers.  Associated symptoms include weakness and dizziness.  In clinic patient had a stat CBC and CMP.  Her hemoglobin was significantly lower than her normal at 7.7 (9.2 a month ago) and she was told to go to the ER.    When seen in the ER patient tells me that she just had 1 melenic stool this morning but it was quite large, she has had none since then but tells me she has been felt weak over the past 3 days and has felt dizzy today.  Describes her history of ulcers in the past.    Denies fever, chills or weight loss.  GI history: 08/06/2020 EGD with multiple nonbleeding gastric ulcers and LA grade a erosive esophagitis, biopsies negative for dysplasia, intestinal metaplasia or malignancy, negative for H. Pylori  Colonoscopy 02/13/2017 Sigmoid diverticulosis, internal hemorrhoids  colonoscopy in March 2013 by Dr. 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   Past Medical History:  Diagnosis Date   Allergy    Anemia    Anginal pain ([place])    Anxiety    Arthritis    Blood transfusion without reported diagnosis    Breast cancer ([place])    Carotid artery plaque    bilat   Cataract    Colon polyps    adenomatous   Concussion 02/2016   Driving restrictions for 6w   Coronary heart disease    Depression    Diverticulosis    Dizziness    Esophageal reflux    Esophageal stricture    Fibromyalgia    Gallstones    Hiatal hernia    Hypercholesterolemia    Hypertension    Hypothyroidism    IBS (irritable bowel syndrome)    Kidney stones    Osteoporosis    Pneumonia    recently, just finished ABO   Pneumonia    PONV (postoperative nausea and vomiting)    Sleep apnea    CPAP   Thyroid disease 2002   Type II or unspecified type diabetes mellitus without mention of complication, not stated as uncontrolled    Unspecified gastritis and gastroduodenitis without mention of hemorrhage     Past Surgical History:  Procedure Laterality Date   ANAL RECTAL MANOMETRY N/A 08/05/2015   Procedure: ANO RECTAL MANOMETRY;  Surgeon: Mauri Pole, MD;  Location: [facility];  Service: Endoscopy;  Laterality: N/A;   APPENDECTOMY     BIOPSY  08/06/2020   Procedure: BIOPSY;  Surgeon: Eloise Harman, DO;  Location: [facility];  Service: Endoscopy;;   BREAST LUMPECTOMY     left   CARDIAC CATHETERIZATION     CATARACT EXTRACTION W/PHACO Left 03/30/2013   Procedure: CATARACT EXTRACTION PHACO AND INTRAOCULAR LENS PLACEMENT (IOC);  Surgeon: Tonny Branch, MD;  Location: [facility];  Service: Ophthalmology;  Laterality: Left;  CDE:19.28   CATARACT EXTRACTION W/PHACO Right 04/09/2013   Procedure: CATARACT EXTRACTION PHACO AND INTRAOCULAR LENS PLACEMENT (IOC);  Surgeon: Tonny Branch, MD;  Location: [facility];  Service: Ophthalmology;  Laterality: Right;  CDE: 15.44   CERVICAL FUSION     CESAREAN SECTION     CHOLECYSTECTOMY     CYSTOCELE REPAIR     DILATION AND CURETTAGE OF UTERUS     ESOPHAGOGASTRODUODENOSCOPY (EGD) WITH PROPOFOL N/A 08/06/2020   Procedure: ESOPHAGOGASTRODUODENOSCOPY (EGD) WITH PROPOFOL;  Surgeon: Eloise Harman, DO;  Location: [facility];  Service: Endoscopy;  Laterality: N/A;   EYE SURGERY Left 03-30-13   Cataract   EYE SURGERY Right 04-09-13   Cataract   RECTOCELE REPAIR     THYROIDECTOMY     TONSILLECTOMY     TOTAL ABDOMINAL HYSTERECTOMY      Family History  Problem Relation Age of Onset   Sarcoidosis Mother    Diabetes Mother    Cancer Mother        sarcoma -  Right arm amputation   Hypertension Mother    Lung cancer Father    Diabetes Father    Heart disease Father        Before age 11   Urolithiasis Father    Cancer Father        Lung   Hyperlipidemia Father    Hypertension Father    Esophageal cancer Maternal Uncle    Ovarian cancer Maternal Aunt    Colon cancer Maternal Uncle    Lung cancer Maternal Uncle    Diabetes Maternal Uncle    Diabetes Maternal Aunt    Colon polyps Maternal Uncle    Heart disease Paternal Grandmother     Social History   Tobacco Use   Smoking status: Never Smoker   Smokeless tobacco: Never Used  Vaping Use   Vaping Use: Never used  Substance Use Topics   Alcohol use: No   Drug use: No    Prior to Admission medications   Medication Sig Start Date End Date Taking? Authorizing Provider  acetaminophen (TYLENOL) 500 MG tablet Take 1 tablet (500 mg total) by mouth every 8 (eight) hours as needed for moderate pain or headache. 09/29/20 09/29/21  Thurnell Lose, MD  amLODipine (NORVASC) 5 MG tablet Take 5 mg by mouth daily. 09/08/20   [provider]  aspirin 81 MG chewable tablet Chew 1 tablet (81 mg total) by mouth daily. 10/13/20   Thurnell Lose, MD  atorvastatin (LIPITOR) 20 MG tablet Take 1 tablet (20 mg total) by mouth daily. 10/17/12   Hillary Bow, MD  CALCIUM-MAGNESIUM-ZINC PO Take 3 tablets by mouth daily.    [provider]  Coenzyme Q10 (CO Q-10) 100 MG CAPS Take 100 mg by mouth daily.    [provider]  cyanocobalamin (,VITAMIN B-12,) 1000 MCG/ML injection INJECT 1 ML INTO MUSCLE ONCE A MONTH Patient taking differently: Inject 1,000 mcg into the muscle every 30 (thirty) days. INJECT 1 ML  INTO MUSCLE ONCE A MONTH 04/27/19   Mauri Pole, MD  doxepin (SINEQUAN) 10 MG capsule Take 10 mg by mouth at bedtime. 02/16/19   [provider]  DULoxetine (CYMBALTA) 60 MG capsule Take 60 mg by mouth daily.    [provider]  EPINEPHrine 0.3 mg/0.3 mL IJ SOAJ injection Inject 0.3 mg into the muscle as needed for anaphylaxis. 07/18/09   [provider]  fexofenadine (ALLEGRA) 30 MG tablet Take 30 mg by mouth daily.    [provider]  fluticasone (FLONASE) 50 MCG/ACT nasal spray Place 2 sprays into the nose daily.    [provider]  furosemide (LASIX) 40 MG tablet Take 1 tablet (40 mg total) by mouth 3 (three) times a week. Take on Monday/Wednesday/Friday 07/06/20   Richardson Dopp T, PA-C  gabapentin (NEURONTIN) 300 MG capsule Take 300 mg by mouth at bedtime.    [provider]  gentamicin (GARAMYCIN) 0.3 % ophthalmic solution Place 1 drop into both eyes 3 (three) times daily as needed (red or irritated eyes). 06/16/20   [provider]  Insulin Glargine-Lixisenatide 100-33 UNT-MCG/ML SOPN Inject 20-25 Units into the skin daily. Pt adjust between 20-25 units    [provider]  linaclotide (LINZESS) 290 MCG CAPS capsule Take 1 capsule (290 mcg total) by mouth daily. 10/17/20   Mauri Pole, MD  Magnesium Oxide 250 MG TABS Take 1 tablet (250 mg total) by mouth daily. 10/31/20   Johnson, Clanford L, MD  meclizine (ANTIVERT) 25 MG  tablet Take 1 tablet (25 mg total) by mouth 3 (three) times daily as needed for dizziness. 09/29/20   Thurnell Lose, MD  Melatonin 10 MG TABS Take 10 mg by mouth at bedtime as needed (sleep).    [provider]  metFORMIN (GLUCOPHAGE) 1000 MG tablet Take 1,000 mg by mouth 2 (two) times daily with a meal.    [provider]  metolazone (ZAROXOLYN) 5 MG tablet Take 2.5 mg by mouth daily.    [provider]  metoprolol succinate (TOPROL-XL) 50 MG 24 hr tablet TAKE 1 TABLET BY MOUTH EVERY DAY Patient taking differently: Take 50 mg by mouth daily. 07/08/20   Sherren Mocha, MD  montelukast (SINGULAIR) 10 MG tablet Take 10 mg by mouth at bedtime.    [provider]  Needles & Syringes MISC 25G 5/8 inch needle on 3 ml syringe-Use once monthly for B12 injections 07/20/15   Nandigam, Karleen Hampshire V, MD  nitroGLYCERIN (NITROSTAT) 0.4 MG SL tablet PLACE 1 TABLET (0.4 MG TOTAL) UNDER THE TONGUE EVERY 5 (FIVE) MINUTES AS NEEDED FOR CHEST PAIN. 12/03/19   Sherren Mocha, MD  Omega-3 Fatty Acids (FISH OIL) 1000 MG CAPS Take 1 capsule by mouth daily. 07/18/09   [provider]  ondansetron (ZOFRAN) 4 MG tablet Take 1 tablet (4 mg total) by mouth every 8 (eight) hours as needed for nausea or vomiting. 09/29/20   Thurnell Lose, MD  [facility] VERIO test strip 1 each by Other route as directed.  04/18/16   [provider]  pantoprazole (PROTONIX) 40 MG tablet Take 1 tablet (40 mg total) by mouth 2 (two) times daily. 08/07/20   Mariel Aloe, MD  pantoprazole (PROTONIX) 40 MG tablet Take 1 tablet (40 mg total) by mouth 2 (two) times daily. 10/17/20   Mauri Pole, MD  potassium chloride (KLOR-CON) 10 MEQ tablet Take 1 tablet (10 mEq total) by mouth daily for 15 days. 10/31/20 11/15/20  Wynetta Emery,  Clanford L, MD  ramipril (ALTACE) 5 MG capsule Take 1 capsule (5 mg total) by mouth at bedtime. 01/11/16   Sherren Mocha, MD  SYNTHROID 137 MCG tablet Take 137 mcg by  mouth daily. 09/30/20   [provider]    Current Facility-Administered Medications  Medication Dose Route Frequency Provider Last Rate Last Admin   0.9 %  sodium chloride infusion (Manually program via Guardrails IV Fluids)   Intravenous Once Deliah Boston, PA-C       Current Outpatient Medications  Medication Sig Dispense Refill   acetaminophen (TYLENOL) 500 MG tablet Take 1 tablet (500 mg total) by mouth every 8 (eight) hours as needed for moderate pain or headache. 20 tablet 0   amLODipine (NORVASC) 5 MG tablet Take 5 mg by mouth daily.     aspirin 81 MG chewable tablet Chew 1 tablet (81 mg total) by mouth daily.     atorvastatin (LIPITOR) 20 MG tablet Take 1 tablet (20 mg total) by mouth daily. 90 tablet 3   CALCIUM-MAGNESIUM-ZINC PO Take 3 tablets by mouth daily.     Coenzyme Q10 (CO Q-10) 100 MG CAPS Take 100 mg by mouth daily.     cyanocobalamin (,VITAMIN B-12,) 1000 MCG/ML injection INJECT 1 ML INTO MUSCLE ONCE A MONTH (Patient taking differently: Inject 1,000 mcg into the muscle every 30 (thirty) days. INJECT 1 ML INTO MUSCLE ONCE A MONTH) 3 mL 3   doxepin (SINEQUAN) 10 MG capsule Take 10 mg by mouth at bedtime.     DULoxetine (CYMBALTA) 60 MG capsule Take 60 mg by mouth daily.     EPINEPHrine 0.3 mg/0.3 mL IJ SOAJ injection Inject 0.3 mg into the muscle as needed for anaphylaxis.     fexofenadine (ALLEGRA) 30 MG tablet Take 30 mg by mouth daily.     fluticasone (FLONASE) 50 MCG/ACT nasal spray Place 2 sprays into the nose daily.     furosemide (LASIX) 40 MG tablet Take 1 tablet (40 mg total) by mouth 3 (three) times a week. Take on Monday/Wednesday/Friday 45 tablet 3   gabapentin (NEURONTIN) 300 MG capsule Take 300 mg by mouth at bedtime.     gentamicin (GARAMYCIN) 0.3 % ophthalmic solution Place 1 drop into both eyes 3 (three) times daily as needed (red or irritated eyes).     Insulin Glargine-Lixisenatide 100-33 UNT-MCG/ML SOPN Inject 20-25 Units  into the skin daily. Pt adjust between 20-25 units     linaclotide (LINZESS) 290 MCG CAPS capsule Take 1 capsule (290 mcg total) by mouth daily. 90 capsule 1   Magnesium Oxide 250 MG TABS Take 1 tablet (250 mg total) by mouth daily. 30 tablet 0   meclizine (ANTIVERT) 25 MG tablet Take 1 tablet (25 mg total) by mouth 3 (three) times daily as needed for dizziness. 20 tablet 0   Melatonin 10 MG TABS Take 10 mg by mouth at bedtime as needed (sleep).     metFORMIN (GLUCOPHAGE) 1000 MG tablet Take 1,000 mg by mouth 2 (two) times daily with a meal.     metolazone (ZAROXOLYN) 5 MG tablet Take 2.5 mg by mouth daily.     metoprolol succinate (TOPROL-XL) 50 MG 24 hr tablet TAKE 1 TABLET BY MOUTH EVERY DAY (Patient taking differently: Take 50 mg by mouth daily.) 90 tablet 3   montelukast (SINGULAIR) 10 MG tablet Take 10 mg by mouth at bedtime.     Needles & Syringes MISC 25G 5/8 inch needle on 3 ml syringe-Use once monthly for  B12 injections 12 each 0   nitroGLYCERIN (NITROSTAT) 0.4 MG SL tablet PLACE 1 TABLET (0.4 MG TOTAL) UNDER THE TONGUE EVERY 5 (FIVE) MINUTES AS NEEDED FOR CHEST PAIN. 25 tablet 5   Omega-3 Fatty Acids (FISH OIL) 1000 MG CAPS Take 1 capsule by mouth daily.     ondansetron (ZOFRAN) 4 MG tablet Take 1 tablet (4 mg total) by mouth every 8 (eight) hours as needed for nausea or vomiting. 20 tablet 0   ONETOUCH VERIO test strip 1 each by Other route as directed.      pantoprazole (PROTONIX) 40 MG tablet Take 1 tablet (40 mg total) by mouth 2 (two) times daily. 60 tablet 0   pantoprazole (PROTONIX) 40 MG tablet Take 1 tablet (40 mg total) by mouth 2 (two) times daily. 120 tablet 3   potassium chloride (KLOR-CON) 10 MEQ tablet Take 1 tablet (10 mEq total) by mouth daily for 15 days. 15 tablet 0   ramipril (ALTACE) 5 MG capsule Take 1 capsule (5 mg total) by mouth at bedtime. 90 capsule 3   SYNTHROID 137 MCG tablet Take 137 mcg by mouth daily.      Allergies as of 12/20/2020 -  Review Complete 12/20/2020  Allergen Reaction Noted   Codeine Hives 04/27/2009   Sulfonamide derivatives Hives 04/27/2009     Review of Systems:    Constitutional: No weight loss, fever or chills  Skin: No rash Cardiovascular: No chest pain Respiratory: No SOB  Gastrointestinal: See HPI and otherwise negative Genitourinary: No dysuria  Neurological: +dizziness Musculoskeletal: No new muscle or joint pain Hematologic: No bruising Psychiatric: No history of depression or anxiety    Physical Exam:  Vital signs in last 24 hours: Temp:  [97.6 F (36.4 C)-97.8 F (36.6 C)] 97.8 F (36.6 C) (03/15 1410) Pulse Rate:  [64-69] 69 (03/15 1449) Resp:  [16-18] 18 (03/15 1449) BP: (109-147)/(55-72) 137/58 (03/15 1449) SpO2:  [92 %-100 %] 92 % (03/15 1449) Weight:  [75.8 kg-76.7 kg] 75.8 kg (03/15 1341)   General:   Pleasant frail, pale appearing, elderly Caucasian female appears to be in NAD, Well developed, Well nourished, alert and cooperative Head:  Normocephalic and atraumatic. Eyes:   PEERL, EOMI. No icterus. Conjunctiva pink. Ears:  Normal auditory acuity. Neck:  Supple Throat: Oral cavity and pharynx without inflammation, swelling or lesion. Lungs: Respirations even and unlabored. Lungs clear to auscultation bilaterally.   No wheezes, crackles, or rhonchi.  Heart: Normal S1, S2. No MRG. Regular rate and rhythm. No peripheral edema, cyanosis or pallor.  Abdomen:  Soft, nondistended, nontender. No rebound or guarding. Normal bowel sounds. No appreciable masses or hepatomegaly. Rectal:  Not performed.  Msk:  Symmetrical without gross deformities. Peripheral pulses intact.  Extremities:  Without edema, no deformity or joint abnormality.  Neurologic:  Alert and  oriented x4;  grossly normal neurologically.  Skin:   Dry and intact without significant lesions or rashes. Psychiatric: Demonstrates good judgement and reason without abnormal affect or behaviors.   LAB RESULTS: Recent  Labs    12/20/20 1143  WBC 10.3  HGB 7.7 Repeated and verified X2.*  HCT 24.4 Repeated and verified X2.*  PLT 628.0*   BMET Recent Labs    12/20/20 1143  NA 135  K 3.6  CL 91*  CO2 34*  GLUCOSE 214*  BUN 18  CREATININE 1.08  CALCIUM 9.0   LFT Recent Labs    12/20/20 1143  PROT 7.0  ALBUMIN 3.8  AST 54*  ALT 26  ALKPHOS 42  BILITOT 0.8    Impression / Plan:   Impression: 1.  Melena: 1 episode of melena this morning, history of gastric ulcers in the past; most likely gastric ulcers 2.  Acute blood loss anemia: Drop in hemoglobin from 9.2 a month ago to 7.7 this morning down to 7.3 this afternoon, likely related to above 3.  Hypokalemia  Plan: 1.  We will schedule the patient for an EGD tomorrow with Dr. Tarri Glenn.  Did discuss risks, benefits, limitations and alternatives and the patient agrees to proceed. 2.  Continue to monitor hemoglobin every 8 hours with transfusion as needed less than 7 3.  Ordered potassium replacement 4.  Agree with 1 unit PRBCs now 5.  Patient to remain on clear liquids today and n.p.o. after midnight 6.  Started the patient on Pantoprazole 40 mg IV twice daily 7.  Please await any further recommendations from Dr. Tarri Glenn later today.  Thank you for your kind consultation, we will continue to follow.  [facility]  12/20/2020, 3:08 PM

## 2020-12-21 ENCOUNTER — Encounter (HOSPITAL_COMMUNITY)
Admission: EM | Disposition: A | Discharge status: Home / Self Care | Payer: Self-pay | Source: Home / Self Care | Attending: Internal Medicine

## 2020-12-21 ENCOUNTER — Observation Stay (HOSPITAL_COMMUNITY): Payer: Medicare HMO | Admitting: Certified Registered Nurse Anesthetist

## 2020-12-21 ENCOUNTER — Encounter (HOSPITAL_COMMUNITY): Payer: Self-pay | Admitting: Internal Medicine

## 2020-12-21 DIAGNOSIS — K921 Melena: Secondary | ICD-10-CM

## 2020-12-21 DIAGNOSIS — I10 Essential (primary) hypertension: Secondary | ICD-10-CM | POA: Diagnosis not present

## 2020-12-21 DIAGNOSIS — K317 Polyp of stomach and duodenum: Secondary | ICD-10-CM | POA: Diagnosis not present

## 2020-12-21 HISTORY — PX: BIOPSY: SHX5522

## 2020-12-21 HISTORY — PX: ESOPHAGOGASTRODUODENOSCOPY (EGD) WITH PROPOFOL: SHX5813

## 2020-12-21 LAB — COMPREHENSIVE METABOLIC PANEL
ALT: 39 U/L (ref 0–44)
AST: 79 U/L — ABNORMAL HIGH (ref 15–41)
Albumin: 3.3 g/dL — ABNORMAL LOW (ref 3.5–5.0)
Alkaline Phosphatase: 43 U/L (ref 38–126)
Anion gap: 10 (ref 5–15)
BUN: 19 mg/dL (ref 8–23)
CO2: 26 mmol/L (ref 22–32)
Calcium: 8.4 mg/dL — ABNORMAL LOW (ref 8.9–10.3)
Chloride: 98 mmol/L (ref 98–111)
Creatinine, Ser: 1.01 mg/dL — ABNORMAL HIGH (ref 0.44–1.00)
GFR, Estimated: 57 mL/min — ABNORMAL LOW (ref >60)
Glucose, Bld: 123 mg/dL — ABNORMAL HIGH (ref 70–99)
Potassium: 3 mmol/L — ABNORMAL LOW (ref 3.5–5.1)
Sodium: 134 mmol/L — ABNORMAL LOW (ref 135–145)
Total Bilirubin: 1.2 mg/dL (ref 0.3–1.2)
Total Protein: 6.4 g/dL — ABNORMAL LOW (ref 6.5–8.1)

## 2020-12-21 LAB — GLUCOSE, CAPILLARY
Glucose-Capillary: 157 mg/dL — ABNORMAL HIGH (ref 70–99)
Glucose-Capillary: 121 mg/dL — ABNORMAL HIGH (ref 70–99)
Glucose-Capillary: 73 mg/dL (ref 70–99)
Glucose-Capillary: 66 mg/dL — ABNORMAL LOW (ref 70–99)
Glucose-Capillary: 112 mg/dL — ABNORMAL HIGH (ref 70–99)
Glucose-Capillary: 327 mg/dL — ABNORMAL HIGH (ref 70–99)

## 2020-12-21 LAB — BPAM RBC
Blood Product Expiration Date: 202204072359
ISSUE DATE / TIME: 202203152335
Unit Type and Rh: 9500

## 2020-12-21 LAB — CBC
HCT: 27.9 % — ABNORMAL LOW (ref 36.0–46.0)
Hemoglobin: 8.4 g/dL — ABNORMAL LOW (ref 12.0–15.0)
MCH: 23.3 pg — ABNORMAL LOW (ref 26.0–34.0)
MCHC: 30.1 g/dL (ref 30.0–36.0)
MCV: 77.3 fL — ABNORMAL LOW (ref 80.0–100.0)
Platelets: 468 10*3/uL — ABNORMAL HIGH (ref 150–400)
RBC: 3.61 MIL/uL — ABNORMAL LOW (ref 3.87–5.11)
RDW: 19.4 % — ABNORMAL HIGH (ref 11.5–15.5)
WBC: 9.2 10*3/uL (ref 4.0–10.5)
nRBC: 0 % (ref 0.0–0.2)

## 2020-12-21 LAB — TYPE AND SCREEN
ABO/RH(D): O NEG
Antibody Screen: NEGATIVE
Unit division: 0

## 2020-12-21 LAB — HEMOGLOBIN
Hemoglobin: 8.6 g/dL — ABNORMAL LOW (ref 12.0–15.0)
Hemoglobin: 9 g/dL — ABNORMAL LOW (ref 12.0–15.0)

## 2020-12-21 SURGERY — ESOPHAGOGASTRODUODENOSCOPY (EGD) WITH PROPOFOL
Anesthesia: Monitor Anesthesia Care

## 2020-12-21 MED ORDER — BISACODYL 5 MG PO TBEC
20.0000 mg | DELAYED_RELEASE_TABLET | Freq: Every day | ORAL | Status: DC | PRN
  Filled 2020-12-21: qty 4

## 2020-12-21 MED ORDER — LIDOCAINE 2% (20 MG/ML) 5 ML SYRINGE
INTRAMUSCULAR | Status: DC | PRN
  Administered 2020-12-21: 60 mg via INTRAVENOUS

## 2020-12-21 MED ORDER — PROPOFOL 500 MG/50ML IV EMUL
INTRAVENOUS | Status: DC | PRN
  Administered 2020-12-21: 100 ug/kg/min via INTRAVENOUS

## 2020-12-21 MED ORDER — PEG-KCL-NACL-NASULF-NA ASC-C 100 G PO SOLR: 1.0000 | Freq: Once | ORAL | Status: DC

## 2020-12-21 MED ORDER — POTASSIUM CHLORIDE 10 MEQ/100ML IV SOLN
10.0000 meq | INTRAVENOUS | Status: AC
  Administered 2020-12-21 (×2): 10 meq via INTRAVENOUS
  Filled 2020-12-21 (×2): qty 100

## 2020-12-21 MED ORDER — ONDANSETRON HCL 4 MG/2ML IJ SOLN
INTRAMUSCULAR | Status: DC | PRN
  Administered 2020-12-21: 4 mg via INTRAVENOUS

## 2020-12-21 MED ORDER — PEG-KCL-NACL-NASULF-NA ASC-C 100 G PO SOLR
0.5000 | Freq: Once | ORAL | Status: DC
  Filled 2020-12-21: qty 1

## 2020-12-21 MED ORDER — PROPOFOL 10 MG/ML IV BOLUS
INTRAVENOUS | Status: DC | PRN
  Administered 2020-12-21: 30 mg via INTRAVENOUS

## 2020-12-21 MED ORDER — PEG-KCL-NACL-NASULF-NA ASC-C 100 G PO SOLR
0.5000 | Freq: Once | ORAL | Status: AC
  Administered 2020-12-21: 100 g via ORAL
  Filled 2020-12-21: qty 1

## 2020-12-21 MED ORDER — LACTATED RINGERS IV SOLN
INTRAVENOUS | Status: DC
  Administered 2020-12-21: 1000 mL via INTRAVENOUS

## 2020-12-21 MED ORDER — DEXTROSE-NACL 5-0.9 % IV SOLN: INTRAVENOUS | Status: AC

## 2020-12-21 MED ORDER — POTASSIUM CHLORIDE CRYS ER 20 MEQ PO TBCR
20.0000 meq | EXTENDED_RELEASE_TABLET | Freq: Once | ORAL | Status: AC
  Administered 2020-12-21: 20 meq via ORAL
  Filled 2020-12-21: qty 1

## 2020-12-21 MED ORDER — PROPOFOL 10 MG/ML IV BOLUS
INTRAVENOUS | Status: AC
  Filled 2020-12-21: qty 20

## 2020-12-21 SURGICAL SUPPLY — 15 items

## 2020-12-21 NOTE — Anesthesia Preprocedure Evaluation (Signed)
Anesthesia Evaluation  Patient identified by MRN, date of birth, ID band Patient awake    Reviewed: Allergy & Precautions, NPO status , Patient's Chart, lab work & pertinent test results, reviewed documented beta blocker date and time   History of Anesthesia Complications (+) PONV  Airway Mallampati: II  TM Distance: >3 FB Neck ROM: Full    Dental no notable dental hx.    Pulmonary sleep apnea ,    Pulmonary exam normal breath sounds clear to auscultation       Cardiovascular hypertension, Pt. on medications and Pt. on home beta blockers + CAD  Normal cardiovascular exam Rhythm:Regular Rate:Normal     Neuro/Psych    GI/Hepatic hiatal hernia, GERD  ,  Endo/Other  diabetes, Type 2Hypothyroidism   Renal/GU      Musculoskeletal   Abdominal   Peds  Hematology  (+) Blood dyscrasia, anemia ,   Anesthesia Other Findings   Reproductive/Obstetrics                             Anesthesia Physical  Anesthesia Plan  ASA: III  Anesthesia Plan: MAC   Post-op Pain Management:    Induction: Intravenous  PONV Risk Score and Plan: 3  Airway Management Planned: Natural Airway, Nasal Cannula and Simple Face Mask  Additional Equipment: None  Intra-op Plan:   Post-operative Plan:   Informed Consent: I have reviewed the patients History and Physical, chart, labs and discussed the procedure including the risks, benefits and alternatives for the proposed anesthesia with the patient or authorized representative who has indicated his/her understanding and acceptance.     Dental advisory given  Plan Discussed with: CRNA and Anesthesiologist  Anesthesia Plan Comments:         Anesthesia Quick Evaluation

## 2020-12-21 NOTE — Progress Notes (Addendum)
PROGRESS NOTE    Jeanne Kim  WUJ:[id number] DOB: 1941/03/21 DOA: 12/20/2020 PCP: Toma Deiters, MD   Chief Complaint  Patient presents with  . Abnormal Lab   Brief Narrative:79 y.o. female with medical history significant for CAD/HTN/HLD/carotid artery disease, T2DM, recent hospitalization for syncopal episode with traumatic subdural hematoma, history of nonbleeding gastric ulcer and erosive esophagitis H. pylori negative on EGD 08/06/2020 was sent to the ED from GI clinic due to low hemoglobin and black tarry stool. Patient was seen at the GI clinic for rectal bleeding and mucus when she was hospitalized with sepsis that had resolved and morning noticed dark tarry stool with mucus similar to what she had when hospitalized in October of last year with a stomach ulcer.  She also complained of being weak and having dizziness.  She had routine blood work CBC CMP done in the clinic that showed hemoglobin significantly lower 7.7 g from 9.2 a month ago and was sent to the ED. On my evaluation -she appears pale weak. Patient otherwise denies any nausea, vomiting, chest pain, shortness of breath, fever, chills, headache, focal weakness, numbness tingling, speech difficulties. She has mild vague abdomen discomfort.  ED Course: Vitals are stable saturating well on room air, lab work further shows potassium 3.1 blood sugar 255, total bili 1.6 hemoglobin down to 7.3 g with microcytosis thrombocytosis.  1 unit PRBC transfusion was ordered, GI was consulted with plan for EGD tomorrow and admission was requested.  Patient admitted after 1 unit transfusion.  Hemoglobin improved.  Subjective: Seen and examined this morning.  Patient reports she feels better from before after blood transfusion.  No nausea vomiting. C/p some abdominal discomfort in the lower abdomen   Assessment & Plan:  Symptomatic anemia with hemoglobin 7.3 g Black tarry stool with hx of gastric ulcer and erosive esophagitis in EGD  07/2020: Admitted due to concern for upper GI bleeding.  Status post 1 unit PRBC hemoglobin appropriately increased.  Continue PPI twice daily, monitor H&H, awaiting on EGD.  Continue to hold off on aspirin.    Hypokalemia added replacement  CAD/HTN/HLD/carotid artery disease: BP is controlled.  Has had no chest pain.  Troponin negative x2.  ekg nsr,  Holding aspirin 81 mg 2/2 #1. Cont on amlodipine 5 mg, metoprolol,lasix,Lipitor 20 mg.Hold ACE inhibitor for now.  T2DM: Blood sugar is controlled, hemoglobin A1c controlled 7.1. Keep on sliding scale insulin, Lantus 10 units for now.  Holding home Metformin at home eventually. Recent Labs  Lab 12/20/20 2158 12/21/20 0242 12/21/20 0556  GLUCAP 227* 157* 121*   Mildly elevated AST- 60-70s.onitor  Hypothyroidism: on Synthroid  IBS continue linaclotide  Fibromyalgia/chronic pain/anxiety/depression:  Pain is controlled continue her home doxepin, Cymbalta, Neurontin.  Thrombocytosis: Suspect reactive monitor. Needs OP follow-up until resolution.  Downtrending here 956O>130Q.  Overweight with BMI 28.67 kg/m: Outpatient PCP follow-up and weight loss..  Diet Order            Diet NPO time specified  Diet effective now                 Patient's Body mass index is 28.72 kg/m.  DVT prophylaxis: SCDs Start: 12/20/20 1601 Code Status:   Code Status: Full Code  Family Communication: plan of care discussed with patient at bedside.  Status is: admitted as Observation Remains hospitalized for ongoing anemia work up- egd negative and will need colonoscopy tomorrow. Dispo: The patient is from: Home  Anticipated d/c is to: Home              Patient currently is not medically stable to d/c.   Difficult to place patient No   Unresulted Labs (From admission, onward)          Start     Ordered   12/20/20 1547  Hemoglobin  Now then every 8 hours,   R      12/20/20 1546         Medications reviewed:  Scheduled  Meds: . amLODipine  5 mg Oral Daily  . atorvastatin  20 mg Oral Daily  . cholecalciferol  2,000 Units Oral Daily  . doxepin  10 mg Oral QHS  . DULoxetine  60 mg Oral Daily  . ferrous sulfate  325 mg Oral Daily  . fluticasone  2 spray Each Nare Daily  . furosemide  40 mg Oral Once per day on Mon Wed Fri  . gabapentin  300 mg Oral QHS  . insulin aspart  0-9 Units Subcutaneous Q6H  . insulin glargine  10 Units Subcutaneous QHS  . levothyroxine  137 mcg Oral Daily  . linaclotide  290 mcg Oral Daily  . loratadine  10 mg Oral Daily  . magnesium oxide  400 mg Oral Daily  . metolazone  5 mg Oral Daily  . metoprolol succinate  50 mg Oral Daily  . midodrine  10 mg Oral Daily  . montelukast  10 mg Oral QHS  . pantoprazole (PROTONIX) IV  40 mg Intravenous Q12H   Continuous Infusions: . sodium chloride 20 mL/hr at 12/20/20 2059  . potassium chloride 10 mEq (12/21/20 1015)    Consultants:see note  Procedures:see note  Antimicrobials: Anti-infectives (From admission, onward)   None     Culture/Microbiology  Other culture-see note  Objective: Vitals: Today's Vitals   12/20/20 2356 12/20/20 2359 12/21/20 0217 12/21/20 0559  BP: 118/62 118/62 122/64 (!) 125/53  Pulse: 60 60 (!) 58 (!) 57  Resp: 18 18 18 20   Temp: 98 F (36.7 C) 98 F (36.7 C) 97.9 F (36.6 C) 97.9 F (36.6 C)  TempSrc:  Oral Oral Oral  SpO2:  98% 96% 94%  Weight:      Height:      PainSc:        Intake/Output Summary (Last 24 hours) at 12/21/2020 1038 Last data filed at 12/21/2020 0310 Gross per 24 hour  Intake 418.52 ml  Output --  Net 418.52 ml   Filed Weights   12/20/20 1341 12/20/20 1832  Weight: 75.8 kg 75.9 kg   Weight change:   Intake/Output from previous day: 03/15 0701 - 03/16 0700 In: 418.5 [I.V.:103.5; Blood:315] Out: -  Intake/Output this shift: No intake/output data recorded. Filed Weights   12/20/20 1341 12/20/20 1832  Weight: 75.8 kg 75.9 kg    Examination: General exam:  AAOx3 ,NAD, weak appearing. HEENT:Oral mucosa moist, Ear/Nose WNL grossly,dentition normal. Respiratory system: bilaterally diminished,no use of accessory muscle, non tender. Cardiovascular system: S1 & S2 +, regular, No JVD. Gastrointestinal system: Abdomen soft, NT,ND, BS+. Nervous System:Alert, awake, moving extremities and grossly nonfocal Extremities: No edema, distal peripheral pulses palpable.  Skin: No rashes,no icterus. MSK: Normal muscle bulk,tone, power  Data Reviewed: I have personally reviewed following labs and imaging studies CBC: Recent Labs  Lab 12/20/20 1143 12/20/20 1415 12/20/20 1650 12/20/20 1852 12/21/20 0545 12/21/20 0747  WBC 10.3 9.2  --   --  9.2  --   NEUTROABS 6.8 5.9  --   --   --   --  HGB 7.7 Repeated and verified X2.* 7.3* 7.3* 7.7* 8.4* 8.6*  HCT 24.4 Repeated and verified X2.* 25.6*  --  27.0* 27.9*  --   MCV 68.9 Repeated and verified X2.* 74.2*  --   --  77.3*  --   PLT 628.0* 628*  --   --  468*  --    Basic Metabolic Panel: Recent Labs  Lab 12/20/20 1143 12/20/20 1415 12/21/20 0545  NA 135 136 134*  K 3.6 3.1* 3.0*  CL 91* 93* 98  CO2 34* 29 26  GLUCOSE 214* 255* 123*  BUN 18 20 19   CREATININE 1.08 1.11* 1.01*  CALCIUM 9.0 9.1 8.4*   GFR: Estimated Creatinine Clearance: 46.5 mL/min (A) (by C-G formula based on SCr of 1.01 mg/dL (H)). Liver Function Tests: Recent Labs  Lab 12/20/20 1143 12/20/20 1415 12/21/20 0545  AST 54* 66* 79*  ALT 26 33 39  ALKPHOS 42 42 43  BILITOT 0.8 1.6* 1.2  PROT 7.0 6.8 6.4*  ALBUMIN 3.8 3.7 3.3*   Recent Labs  Lab 12/20/20 1415  LIPASE 23   No results for input(s): AMMONIA in the last 168 hours. Coagulation Profile: No results for input(s): INR, PROTIME in the last 168 hours. Cardiac Enzymes: No results for input(s): CKTOTAL, CKMB, CKMBINDEX, TROPONINI in the last 168 hours. BNP (last 3 results) Recent Labs    07/04/20 1524  PROBNP 165   HbA1C: Recent Labs    12/20/20 1415   HGBA1C 7.4*   CBG: Recent Labs  Lab 12/20/20 2158 12/21/20 0242 12/21/20 0556  GLUCAP 227* 157* 121*   Lipid Profile: No results for input(s): CHOL, HDL, LDLCALC, TRIG, CHOLHDL, LDLDIRECT in the last 72 hours. Thyroid Function Tests: No results for input(s): TSH, T4TOTAL, FREET4, T3FREE, THYROIDAB in the last 72 hours. Anemia Panel: No results for input(s): VITAMINB12, FOLATE, FERRITIN, TIBC, IRON, RETICCTPCT in the last 72 hours. Sepsis Labs: No results for input(s): PROCALCITON, LATICACIDVEN in the last 168 hours.  Recent Results (from the past 240 hour(s))  Resp Panel by RT-PCR (Flu A&B, Covid) Nasopharyngeal Swab     Status: None   Collection Time: 12/20/20  5:18 PM   Specimen: Nasopharyngeal Swab; Nasopharyngeal(NP) swabs in vial transport medium  Result Value Ref Range Status   SARS Coronavirus 2 by RT PCR NEGATIVE NEGATIVE Final    Comment: (NOTE) SARS-CoV-2 target nucleic acids are NOT DETECTED.  The SARS-CoV-2 RNA is generally detectable in upper respiratory specimens during the acute phase of infection. The lowest concentration of SARS-CoV-2 viral copies this assay can detect is 138 copies/mL. A negative result does not preclude SARS-Cov-2 infection and should not be used as the sole basis for treatment or other patient management decisions. A negative result may occur with  improper specimen collection/handling, submission of specimen other than nasopharyngeal swab, presence of viral mutation(s) within the areas targeted by this assay, and inadequate number of viral copies(<138 copies/mL). A negative result must be combined with clinical observations, patient history, and epidemiological information. The expected result is Negative.  Fact Sheet for Patients:  BloggerCourse.com  Fact Sheet for Healthcare Providers:  SeriousBroker.it  This test is no t yet approved or cleared by the [place] FDA and  has  been authorized for detection and/or diagnosis of SARS-CoV-2 by FDA under an Emergency Use Authorization (EUA). This EUA will remain  in effect (meaning this test can be used) for the duration of the COVID-19 declaration under Section 564(b)(1) of the Act, 21 U.S.C.section 360bbb-3(b)(1), unless  the authorization is terminated  or revoked sooner.       Influenza A by PCR NEGATIVE NEGATIVE Final   Influenza B by PCR NEGATIVE NEGATIVE Final    Comment: (NOTE) The Xpert Xpress SARS-CoV-2/FLU/RSV plus assay is intended as an aid in the diagnosis of influenza from Nasopharyngeal swab specimens and should not be used as a sole basis for treatment. Nasal washings and aspirates are unacceptable for Xpert Xpress SARS-CoV-2/FLU/RSV testing.  Fact Sheet for Patients: BloggerCourse.com  Fact Sheet for Healthcare Providers: SeriousBroker.it  This test is not yet approved or cleared by the [place] FDA and has been authorized for detection and/or diagnosis of SARS-CoV-2 by FDA under an Emergency Use Authorization (EUA). This EUA will remain in effect (meaning this test can be used) for the duration of the COVID-19 declaration under Section 564(b)(1) of the Act, 21 U.S.C. section 360bbb-3(b)(1), unless the authorization is terminated or revoked.  Performed at [facility], [address], [place], [place] [address]      Radiology Studies: No results found.   LOS: 0 days   Lanae Boast, MD [facility]  12/21/2020, 10:38 AM

## 2020-12-21 NOTE — Anesthesia Procedure Notes (Signed)
Procedure Name: MAC Performed by: Terika Pillard L, CRNA Pre-anesthesia Checklist: Patient identified, Emergency Drugs available, Suction available, Patient being monitored and Timeout performed Patient Re-evaluated:Patient Re-evaluated prior to induction Oxygen Delivery Method: Simple face mask Preoxygenation: Pre-oxygenation with 100% oxygen Placement Confirmation: positive ETCO2 Dental Injury: Teeth and Oropharynx as per pre-operative assessment        

## 2020-12-21 NOTE — Op Note (Signed)
[facility] Patient Name: Jeanne Kim Procedure Date: 12/21/2020 MRN: [id number] Attending MD: Thornton Park MD, MD Date of Birth: [birth year] CSN: [id number] Age: 80 Admit Type: Inpatient Procedure:                Upper GI endoscopy Indications:              Melena, Suspected upper gastrointestinal bleeding Providers:                Thornton Park MD, MD, Clyde Lundborg, RN, Benetta Spar, Technician Referring MD:              Medicines:                Monitored Anesthesia Care Complications:            No immediate complications. Estimated blood loss:                            Minimal. Estimated Blood Loss:     Estimated blood loss was minimal. Procedure:                Pre-Anesthesia Assessment:                           - Prior to the procedure, a History and Physical                            was performed, and patient medications and                            allergies were reviewed. The patient's tolerance of                            previous anesthesia was also reviewed. The risks                            and benefits of the procedure and the sedation                            options and risks were discussed with the patient.                            All questions were answered, and informed consent                            was obtained. Prior Anticoagulants: The patient has                            taken no previous anticoagulant or antiplatelet                            agents. ASA Grade Assessment: III - A patient with  severe systemic disease. After reviewing the risks                            and benefits, the patient was deemed in                            satisfactory condition to undergo the procedure.                           After obtaining informed consent, the endoscope was                            passed under direct vision. Throughout the                             procedure, the patient's blood pressure, pulse, and                            oxygen saturations were monitored continuously. The                            [id number]-[id number] ([id number]) Olympus gastroscope was                            introduced through the mouth, and advanced to the                            third part of duodenum. The upper GI endoscopy was                            accomplished without difficulty. The patient                            tolerated the procedure well. Scope In: Scope Out: Findings:      The examined esophagus was mildly tortuous. No ring, web, or stricture.      A medium-sized hiatal hernia was present. No Cameron's ulcers.      Multiple sessile polyps with no stigmata of recent bleeding were found       in the gastric body. Biopsies were taken with a cold forceps for       histology. Estimated blood loss was minimal.      The entire examined stomach was normal except for mild, patchy erosions       in the antrum. Biopsies were taken of the antrum, body, and fundus with       a cold forceps for histology. Estimated blood loss was minimal.      The examined duodenum was normal. Impression:               - Tortuous esophagus.                           - Medium-sized hiatal hernia.                           - Multiple gastric polyps. Biopsied.                           -  A few gastric erosions. Biopsied.                           - No evidence for recent or active bleeding. Moderate Sedation:      Not Applicable - Patient had care per Anesthesia. Recommendation:           - Patient has a contact number available for                            emergencies. The signs and symptoms of potential                            delayed complications were discussed with the                            patient. Return to normal activities tomorrow.                            Written discharge instructions were provided to the                            patient.                            - Resume previous diet.                           - Continue present medications.                           - Await pathology results.                           - Proceed with colonoscopy 12/22/20 to identify the                            source of bleeding. Procedure Code(s):        --- Professional ---                           [phone], Esophagogastroduodenoscopy, flexible,                            transoral; with biopsy, single or multiple Diagnosis Code(s):        --- Professional ---                           Q39.9, Congenital malformation of esophagus,                            unspecified                           K44.9, Diaphragmatic hernia without obstruction or                            gangrene  K31.7, Polyp of stomach and duodenum                           K92.1, Melena (includes Hematochezia) CPT copyright 2019 American Medical Association. All rights reserved. The codes documented in this report are preliminary and upon coder review may  be revised to meet current compliance requirements. Thornton Park MD, MD 12/21/2020 3:25:00 PM This report has been signed electronically. Number of Addenda: 0

## 2020-12-21 NOTE — Transfer of Care (Signed)
Immediate Anesthesia Transfer of Care Note  Patient: Jeanne Kim  Procedure(s) Performed: ESOPHAGOGASTRODUODENOSCOPY (EGD) WITH PROPOFOL (N/A ) BIOPSY  Patient Location: PACU  Anesthesia Type:MAC  Level of Consciousness: drowsy and patient cooperative  Airway & Oxygen Therapy: Patient Spontanous Breathing and Patient connected to face mask oxygen  Post-op Assessment: Report given to RN and Post -op Vital signs reviewed and stable  Post vital signs: Reviewed and stable  Last Vitals:  Vitals Value Taken Time  BP    Temp    Pulse 60 12/21/20 1507  Resp 18 12/21/20 1507  SpO2 100 % 12/21/20 1507  Vitals shown include unvalidated device data.  Last Pain:  Vitals:   12/21/20 1433  TempSrc: Oral  PainSc: 0-No pain      Patients Stated Pain Goal: 0 (16/96/78 9381)  Complications: No complications documented.

## 2020-12-21 NOTE — Interval H&P Note (Signed)
History and Physical Interval Note:  12/21/2020 2:38 PM  Jeanne Kim  has presented today for surgery, with the diagnosis of melena.  The various methods of treatment have been discussed with the patient and family. After consideration of risks, benefits and other options for treatment, the patient has consented to  Procedure(s): ESOPHAGOGASTRODUODENOSCOPY (EGD) WITH PROPOFOL (N/A) as a surgical intervention.  The patient's history has been reviewed, patient examined, no change in status, stable for surgery.  I have reviewed the patient's chart and labs.  Questions were answered to the patient's satisfaction.     Thornton Park

## 2020-12-22 ENCOUNTER — Encounter (HOSPITAL_COMMUNITY): Payer: Self-pay | Admitting: Gastroenterology

## 2020-12-22 DIAGNOSIS — I10 Essential (primary) hypertension: Secondary | ICD-10-CM | POA: Diagnosis not present

## 2020-12-22 DIAGNOSIS — K922 Gastrointestinal hemorrhage, unspecified: Secondary | ICD-10-CM | POA: Diagnosis not present

## 2020-12-22 DIAGNOSIS — D649 Anemia, unspecified: Secondary | ICD-10-CM

## 2020-12-22 LAB — CBC
HCT: 27.9 % — ABNORMAL LOW (ref 36.0–46.0)
Hemoglobin: 8.3 g/dL — ABNORMAL LOW (ref 12.0–15.0)
MCH: 23.3 pg — ABNORMAL LOW (ref 26.0–34.0)
MCHC: 29.7 g/dL — ABNORMAL LOW (ref 30.0–36.0)
MCV: 78.4 fL — ABNORMAL LOW (ref 80.0–100.0)
Platelets: 411 10*3/uL — ABNORMAL HIGH (ref 150–400)
RBC: 3.56 MIL/uL — ABNORMAL LOW (ref 3.87–5.11)
RDW: 19.9 % — ABNORMAL HIGH (ref 11.5–15.5)
WBC: 7 10*3/uL (ref 4.0–10.5)
nRBC: 0 % (ref 0.0–0.2)

## 2020-12-22 LAB — GLUCOSE, CAPILLARY
Glucose-Capillary: 108 mg/dL — ABNORMAL HIGH (ref 70–99)
Glucose-Capillary: 161 mg/dL — ABNORMAL HIGH (ref 70–99)
Glucose-Capillary: 177 mg/dL — ABNORMAL HIGH (ref 70–99)
Glucose-Capillary: 231 mg/dL — ABNORMAL HIGH (ref 70–99)

## 2020-12-22 LAB — BASIC METABOLIC PANEL
Anion gap: 10 (ref 5–15)
BUN: 12 mg/dL (ref 8–23)
CO2: 28 mmol/L (ref 22–32)
Calcium: 8.1 mg/dL — ABNORMAL LOW (ref 8.9–10.3)
Chloride: 101 mmol/L (ref 98–111)
Creatinine, Ser: 0.98 mg/dL (ref 0.44–1.00)
GFR, Estimated: 59 mL/min — ABNORMAL LOW (ref >60)
Glucose, Bld: 163 mg/dL — ABNORMAL HIGH (ref 70–99)
Potassium: 2.9 mmol/L — ABNORMAL LOW (ref 3.5–5.1)
Sodium: 139 mmol/L (ref 135–145)

## 2020-12-22 LAB — HEMOGLOBIN
Hemoglobin: 8.4 g/dL — ABNORMAL LOW (ref 12.0–15.0)
Hemoglobin: 8.6 g/dL — ABNORMAL LOW (ref 12.0–15.0)

## 2020-12-22 LAB — POTASSIUM: Potassium: 3.5 mmol/L (ref 3.5–5.1)

## 2020-12-22 LAB — MAGNESIUM: Magnesium: 1.2 mg/dL — ABNORMAL LOW (ref 1.7–2.4)

## 2020-12-22 LAB — SURGICAL PATHOLOGY

## 2020-12-22 MED ORDER — POTASSIUM CHLORIDE CRYS ER 20 MEQ PO TBCR
40.0000 meq | EXTENDED_RELEASE_TABLET | ORAL | Status: AC
  Administered 2020-12-22: 40 meq via ORAL
  Filled 2020-12-22: qty 2

## 2020-12-22 MED ORDER — PEG-KCL-NACL-NASULF-NA ASC-C 100 G PO SOLR
0.5000 | Freq: Once | ORAL | Status: AC
  Administered 2020-12-23: 100 g via ORAL

## 2020-12-22 MED ORDER — POTASSIUM CHLORIDE 10 MEQ/100ML IV SOLN
10.0000 meq | INTRAVENOUS | Status: AC
  Administered 2020-12-22 (×2): 10 meq via INTRAVENOUS
  Filled 2020-12-22 (×4): qty 100

## 2020-12-22 MED ORDER — PEG-KCL-NACL-NASULF-NA ASC-C 100 G PO SOLR
0.5000 | Freq: Once | ORAL | Status: AC
  Administered 2020-12-22: 100 g via ORAL
  Filled 2020-12-22: qty 1

## 2020-12-22 MED ORDER — MAGNESIUM SULFATE 4 GM/100ML IV SOLN
4.0000 g | Freq: Once | INTRAVENOUS | Status: AC
  Administered 2020-12-22: 4 g via INTRAVENOUS
  Filled 2020-12-22: qty 100

## 2020-12-22 MED ORDER — PEG-KCL-NACL-NASULF-NA ASC-C 100 G PO SOLR: 1.0000 | Freq: Once | ORAL | Status: DC

## 2020-12-22 MED ORDER — SODIUM CHLORIDE 0.9 % IV SOLN: INTRAVENOUS | Status: DC

## 2020-12-22 MED ORDER — POTASSIUM CHLORIDE CRYS ER 20 MEQ PO TBCR
20.0000 meq | EXTENDED_RELEASE_TABLET | Freq: Every day | ORAL | Status: DC
  Administered 2020-12-22 – 2020-12-24 (×2): 20 meq via ORAL
  Filled 2020-12-22 (×2): qty 1

## 2020-12-22 NOTE — Progress Notes (Signed)
PROGRESS NOTE    Jeanne Kim  IHK:[id number] DOB: 1940/12/03 DOA: 12/20/2020 PCP: Neale Burly, MD   Chief Complaint  Patient presents with  . Abnormal Lab   Brief Narrative:79 y.o. female with medical history significant for CAD/HTN/HLD/carotid artery disease, T2DM, recent hospitalization for syncopal episode with traumatic subdural hematoma, history of nonbleeding gastric ulcer and erosive esophagitis H. pylori negative on EGD 08/06/2020 was sent to the ED from GI clinic due to low hemoglobin and black tarry stool. Patient was seen at the GI clinic for rectal bleeding and mucus when she was hospitalized with sepsis that had resolved and morning noticed dark tarry stool with mucus similar to what she had when hospitalized in October of last year with a stomach ulcer.  She also complained of being weak and having dizziness.  She had routine blood work CBC CMP done in the clinic that showed hemoglobin significantly lower 7.7 g from 9.2 a month ago and was sent to the ED. On my evaluation -she appears pale weak. Patient otherwise denies any nausea, vomiting, chest pain, shortness of breath, fever, chills, headache, focal weakness, numbness tingling, speech difficulties. She has mild vague abdomen discomfort.  ED Course: Vitals are stable saturating well on room air, lab work further shows potassium 3.1 blood sugar 255, total bili 1.6 hemoglobin down to 7.3 g with microcytosis thrombocytosis.  1 unit PRBC transfusion was ordered, GI was consulted with plan for EGD tomorrow and admission was requested.  Patient admitted after 1 unit prbc transfusion.  Hemoglobin improved. Underwent EGD no acute significant finding and planning for colonoscopy  Subjective: Seen this morning patient is still having hard stool despite bowel prep colonoscopy is being planned for 10/10/2016  seen and examined this morning.  Patient reports she feels better from before after blood transfusion.  No nausea vomiting.  C/p some abdominal discomfort in the lower abdomen   Assessment & Plan:  Symptomatic anemia with hemoglobin 7.3 g Black tarry stool with hx of gastric ulcer and erosive esophagitis in EGD 07/2020: Admitted due to concern for GI bleeding-Status post 1 unit PRBC hemoglobin appropriately increased. Underwent EGD no acute finding, recommended colonoscopy for further work-up of her anemia blackish stool.  Status post colon prep but still not clear and with severe hypokalemia GI planning for colonoscopy tomorrow.  Continue PPI twice daily.  Hold off on aspirin.  Monitor H&H.   Severe Hypokalemia, worsened by bowel prep, Lasix,  repleting aggressively this morning again.  Recheck potassium later today.  Check magnesium level.  CAD/HTN/HLD/carotid artery disease: BP is controlled, no chest pain troponin negative x2 EKG sinus rhythm.  On aspirin 81 at home and currently on hold.  Continue amlodipine metoprolol Lasix and Lipitor home dose.  Holding ACE inhibitor for now.    T2DM: Hyperglycemia at 4 pm 3316 - stopped Lantus, keep on sliding scale insulin for now , hemoglobin A1c controlled 7.1. Holding home Metformin at home eventually. Recent Labs  Lab 12/21/20 1434 12/21/20 1646 12/21/20 1740 12/21/20 2320 12/22/20 0620  GLUCAP 73 66* 112* 327* 177*   Mildly elevated AST- 60-70s.monitor.    Hypothyroidism: Current tinea Synthroid  IBS continue linaclotide  Fibromyalgia/chronic pain/anxiety/depression:  No new issues, continue her home doxepin, Cymbalta, Neurontin.  Thrombocytosis: Suspect reactive monitor. Needs OP follow-up until resolution.  Downtrending here [phone].  Overweight with BMI 28.67 kg/m: Outpatient PCP follow-up and weight loss..  Diet Order            Diet NPO time specified  Diet effective now                 Patient's Body mass index is 28.72 kg/m.  DVT prophylaxis: SCDs Start: 12/20/20 1601 Code Status:   Code Status: Full Code  Family  Communication: plan of care discussed with patient at bedside.  Status is: admitted as Observation Remains hospitalized for ongoing anemia work up- egd was negative and will need colonoscopy and because of cold and being not clear and due to hypokalemia planning colonoscopy for tomorrow by GI.  Dispo: The patient is from: Home              Anticipated d/c is to: Home              Patient currently is not medically stable to d/c.   Difficult to place patient No   Unresulted Labs (From admission, onward)          Start     Ordered   12/20/20 1547  Hemoglobin  Now then every 8 hours,   R (with TIMED occurrences)      12/20/20 1546         Medications reviewed:  Scheduled Meds: . amLODipine  5 mg Oral Daily  . atorvastatin  20 mg Oral Daily  . cholecalciferol  2,000 Units Oral Daily  . doxepin  10 mg Oral QHS  . DULoxetine  60 mg Oral Daily  . ferrous sulfate  325 mg Oral Daily  . fluticasone  2 spray Each Nare Daily  . furosemide  40 mg Oral Once per day on Mon Wed Fri  . gabapentin  300 mg Oral QHS  . insulin aspart  0-9 Units Subcutaneous Q6H  . levothyroxine  137 mcg Oral Daily  . linaclotide  290 mcg Oral Daily  . loratadine  10 mg Oral Daily  . magnesium oxide  400 mg Oral Daily  . metolazone  5 mg Oral Daily  . metoprolol succinate  50 mg Oral Daily  . midodrine  10 mg Oral Daily  . montelukast  10 mg Oral QHS  . pantoprazole (PROTONIX) IV  40 mg Intravenous Q12H  . peg 3350 powder  0.5 kit Oral Once  . potassium chloride  20 mEq Oral Daily   Continuous Infusions: . sodium chloride 20 mL/hr at 12/22/20 0934  . dextrose 5 % and 0.9% NaCl 50 mL/hr at 12/21/20 2150  . potassium chloride 10 mEq (12/22/20 0932)    Consultants:see note  Procedures:see note  Antimicrobials: Anti-infectives (From admission, onward)   None     Culture/Microbiology  Other culture-see note  Objective: Vitals: Today's Vitals   12/21/20 1545 12/21/20 2106 12/21/20 2115 12/22/20  0435  BP: (!) 134/58 (!) 118/59  121/67  Pulse: (!) 57 67  65  Resp: $Remo'15 18  18  'dbfTO$ Temp:  97.9 F (36.6 C)  97.9 F (36.6 C)  TempSrc:  Oral  Oral  SpO2: 99% 96%  94%  Weight:      Height:      PainSc: 0-No pain  0-No pain     Intake/Output Summary (Last 24 hours) at 12/22/2020 1047 Last data filed at 12/22/2020 1033 Gross per 24 hour  Intake 572.19 ml  Output --  Net 572.19 ml   Filed Weights   12/20/20 1341 12/20/20 1832  Weight: 75.8 kg 75.9 kg   Weight change:   Intake/Output from previous day: 03/16 0701 - 03/17 0700 In: 572.2 [I.V.:572.2] Out: -  Intake/Output this shift: No  intake/output data recorded. Filed Weights   12/20/20 1341 12/20/20 1832  Weight: 75.8 kg 75.9 kg    Examination: General exam: AAO X3,NAD, weak appearing. HEENT:Oral mucosa moist, Ear/Nose WNL grossly, dentition normal. Respiratory system: bilaterally diminishedd,no wheezing or crackles,no use of accessory muscle Cardiovascular system: S1 & S2 +, No JVD,. Gastrointestinal system: Abdomen soft, NT,ND, BS+ Nervous System:Alert, awake, moving extremities and grossly nonfocal Extremities: No edema, distal peripheral pulses palpable.  Skin: No rashes,no icterus. MSK: Normal muscle bulk,tone, power    Data Reviewed: I have personally reviewed following labs and imaging studies CBC: Recent Labs  Lab 12/20/20 1143 12/20/20 1415 12/20/20 1650 12/20/20 1852 12/21/20 0545 12/21/20 0747 12/21/20 2312 12/22/20 0725  WBC 10.3 9.2  --   --  9.2  --   --  7.0  NEUTROABS 6.8 5.9  --   --   --   --   --   --   HGB 7.7 Repeated and verified X2.* 7.3*   < > 7.7* 8.4* 8.6* 9.0* 8.4*  8.3*  HCT 24.4 Repeated and verified X2.* 25.6*  --  27.0* 27.9*  --   --  27.9*  MCV 68.9 Repeated and verified X2.* 74.2*  --   --  77.3*  --   --  78.4*  PLT 628.0* 628*  --   --  468*  --   --  411*   < > = values in this interval not displayed.   Basic Metabolic Panel: Recent Labs  Lab 12/20/20 1143  12/20/20 1415 12/21/20 0545 12/22/20 0725  NA 135 136 134* 139  K 3.6 3.1* 3.0* 2.9*  CL 91* 93* 98 101  CO2 34* $Remov'29 26 28  'KjeBiz$ GLUCOSE 214* 255* 123* 163*  BUN $Re'18 20 19 12  'eBK$ CREATININE 1.08 1.11* 1.01* 0.98  CALCIUM 9.0 9.1 8.4* 8.1*   GFR: Estimated Creatinine Clearance: 48 mL/min (by C-G formula based on SCr of 0.98 mg/dL). Liver Function Tests: Recent Labs  Lab 12/20/20 1143 12/20/20 1415 12/21/20 0545  AST 54* 66* 79*  ALT 26 33 39  ALKPHOS 42 42 43  BILITOT 0.8 1.6* 1.2  PROT 7.0 6.8 6.4*  ALBUMIN 3.8 3.7 3.3*   Recent Labs  Lab 12/20/20 1415  LIPASE 23   No results for input(s): AMMONIA in the last 168 hours. Coagulation Profile: No results for input(s): INR, PROTIME in the last 168 hours. Cardiac Enzymes: No results for input(s): CKTOTAL, CKMB, CKMBINDEX, TROPONINI in the last 168 hours. BNP (last 3 results) Recent Labs    07/04/20 1524  PROBNP 165   HbA1C: Recent Labs    12/20/20 1415  HGBA1C 7.4*   CBG: Recent Labs  Lab 12/21/20 1434 12/21/20 1646 12/21/20 1740 12/21/20 2320 12/22/20 0620  GLUCAP 73 66* 112* 327* 177*   Lipid Profile: No results for input(s): CHOL, HDL, LDLCALC, TRIG, CHOLHDL, LDLDIRECT in the last 72 hours. Thyroid Function Tests: No results for input(s): TSH, T4TOTAL, FREET4, T3FREE, THYROIDAB in the last 72 hours. Anemia Panel: No results for input(s): VITAMINB12, FOLATE, FERRITIN, TIBC, IRON, RETICCTPCT in the last 72 hours. Sepsis Labs: No results for input(s): PROCALCITON, LATICACIDVEN in the last 168 hours.  Recent Results (from the past 240 hour(s))  Resp Panel by RT-PCR (Flu A&B, Covid) Nasopharyngeal Swab     Status: None   Collection Time: 12/20/20  5:18 PM   Specimen: Nasopharyngeal Swab; Nasopharyngeal(NP) swabs in vial transport medium  Result Value Ref Range Status   SARS Coronavirus 2 by RT  PCR NEGATIVE NEGATIVE Final    Comment: (NOTE) SARS-CoV-2 target nucleic acids are NOT DETECTED.  The SARS-CoV-2  RNA is generally detectable in upper respiratory specimens during the acute phase of infection. The lowest concentration of SARS-CoV-2 viral copies this assay can detect is 138 copies/mL. A negative result does not preclude SARS-Cov-2 infection and should not be used as the sole basis for treatment or other patient management decisions. A negative result may occur with  improper specimen collection/handling, submission of specimen other than nasopharyngeal swab, presence of viral mutation(s) within the areas targeted by this assay, and inadequate number of viral copies(<138 copies/mL). A negative result must be combined with clinical observations, patient history, and epidemiological information. The expected result is Negative.  Fact Sheet for Patients:  EntrepreneurPulse.com.au  Fact Sheet for Healthcare Providers:  IncredibleEmployment.be  This test is no t yet approved or cleared by the [place] FDA and  has been authorized for detection and/or diagnosis of SARS-CoV-2 by FDA under an Emergency Use Authorization (EUA). This EUA will remain  in effect (meaning this test can be used) for the duration of the COVID-19 declaration under Section 564(b)(1) of the Act, 21 U.S.C.section 360bbb-3(b)(1), unless the authorization is terminated  or revoked sooner.       Influenza A by PCR NEGATIVE NEGATIVE Final   Influenza B by PCR NEGATIVE NEGATIVE Final    Comment: (NOTE) The Xpert Xpress SARS-CoV-2/FLU/RSV plus assay is intended as an aid in the diagnosis of influenza from Nasopharyngeal swab specimens and should not be used as a sole basis for treatment. Nasal washings and aspirates are unacceptable for Xpert Xpress SARS-CoV-2/FLU/RSV testing.  Fact Sheet for Patients: EntrepreneurPulse.com.au  Fact Sheet for Healthcare Providers: IncredibleEmployment.be  This test is not yet approved or cleared by the  [place] FDA and has been authorized for detection and/or diagnosis of SARS-CoV-2 by FDA under an Emergency Use Authorization (EUA). This EUA will remain in effect (meaning this test can be used) for the duration of the COVID-19 declaration under Section 564(b)(1) of the Act, 21 U.S.C. section 360bbb-3(b)(1), unless the authorization is terminated or revoked.  Performed at [facility], [place] [address], [place], [place] [address]      Radiology Studies: No results found.   LOS: 0 days   Antonieta Pert, MD [facility]  12/22/2020, 10:47 AM

## 2020-12-22 NOTE — Progress Notes (Signed)
[facility] Progress Note  CC:  Anemia, melena   Subjective:  She completed her bowel prep early this am but was still passing thick brown thick liquid to soft stool as reported by her day shift RN. Plan to repeat colonoscopy tomorrow. She denies having any N/V or abdominal pain. No CP or SOB.   Objective:   EGD 12/21/2020: - Tortuous esophagus. - Medium-sized hiatal hernia. - Multiple gastric polyps. Biopsied. - A few gastric erosions. Biopsied. - No evidence for recent or active bleeding.  Vital signs in last 24 hours: Temp:  [97.9 F (36.6 C)-98.8 F (37.1 C)] 98.8 F (37.1 C) (03/17 1317) Pulse Rate:  [65-85] 85 (03/17 1317) Resp:  [18-20] 20 (03/17 1317) BP: (118-133)/(59-67) 133/65 (03/17 1317) SpO2:  [94 %-97 %] 97 % (03/17 1317) Last BM Date: 12/22/20 General:   Alert 80 year old female in NAD.  Heart: RRR, no murmur.  Pulm:  Breath sounds clear.  Abdomen: Soft, nontender. + BS x 4 quads.  Extremities:  Without edema. Neurologic:  Alert and  oriented x4;  grossly normal neurologically. Psych:  Alert and cooperative. Normal mood and affect.  Intake/Output from previous day: 03/16 0701 - 03/17 0700 In: 572.2 [I.V.:572.2] Out: -  Intake/Output this shift: No intake/output data recorded.  Lab Results: Recent Labs    12/20/20 1415 12/20/20 1650 12/20/20 1852 12/21/20 0545 12/21/20 0747 12/21/20 2312 12/22/20 0725 12/22/20 1801  WBC 9.2  --   --  9.2  --   --  7.0  --   HGB 7.3*   < > 7.7* 8.4*   < > 9.0* 8.4*  8.3* 8.6*  HCT 25.6*  --  27.0* 27.9*  --   --  27.9*  --   PLT 628*  --   --  468*  --   --  411*  --    < > = values in this interval not displayed.   BMET Recent Labs    12/20/20 1415 12/21/20 0545 12/22/20 0725 12/22/20 1801  NA 136 134* 139  --   K 3.1* 3.0* 2.9* 3.5  CL 93* 98 101  --   CO2 29 26 28   --   GLUCOSE 255* 123* 163*  --   BUN 20 19 12   --   CREATININE 1.11* 1.01* 0.98  --   CALCIUM 9.1 8.4* 8.1*  --     LFT Recent Labs    12/21/20 0545  PROT 6.4*  ALBUMIN 3.3*  AST 79*  ALT 39  ALKPHOS 43  BILITOT 1.2   PT/INR No results for input(s): LABPROT, INR in the last 72 hours. Hepatitis Panel No results for input(s): HEPBSAG, HCVAB, HEPAIGM, HEPBIGM in the last 72 hours.  No results found.  Assessment / Plan:  49. 80 year old female admitted to the hospital with melena with anemia. Admission Hg 7.7 -> 7.3.   Transfused 1 unit of PRBCs on 3/15. EGD 3/16 showed a few gastric polyps and erosions, no active bleeding. Colonoscopy was planned for today but she continued to pass thick brown stool after completing her bowel prep and potassium this am was 2.9. Today Hg stable at 8.6 -Repeat bowel prep this evening -Colonoscopy with Dr. Tarri Glenn Friday 12/23/2020 -Repeat CBC and BMP in am  -NPO after midnight   2. Hypokalemia -KCL IV and po orders per the hospitalist   Active Problems:   Hypothyroidism   Obstructive sleep apnea   Benign essential HTN   Irritable  bowel syndrome   Type 2 diabetes mellitus (HCC)   Fibromyalgia   Thrombocytosis   GI bleeding     LOS: 0 days   Noralyn Pick  12/22/2020, 11:00am

## 2020-12-22 NOTE — Anesthesia Postprocedure Evaluation (Signed)
Anesthesia Post Note  Patient: Jeanne Kim  Procedure(s) Performed: ESOPHAGOGASTRODUODENOSCOPY (EGD) WITH PROPOFOL (N/A ) BIOPSY     Patient location during evaluation: PACU Anesthesia Type: MAC Level of consciousness: awake and alert Pain management: pain level controlled Vital Signs Assessment: post-procedure vital signs reviewed and stable Respiratory status: spontaneous breathing, nonlabored ventilation, respiratory function stable and patient connected to nasal cannula oxygen Cardiovascular status: stable and blood pressure returned to baseline Postop Assessment: no apparent nausea or vomiting Anesthetic complications: no   No complications documented.  Last Vitals:  Vitals:   12/22/20 0435 12/22/20 1317  BP: 121/67 133/65  Pulse: 65 85  Resp: 18 20  Temp: 36.6 C 37.1 C  SpO2: 94% 97%    Last Pain:  Vitals:   12/22/20 1317  TempSrc: Oral  PainSc:                  Adalae Baysinger

## 2020-12-23 ENCOUNTER — Encounter (HOSPITAL_COMMUNITY): Payer: Self-pay | Admitting: Internal Medicine

## 2020-12-23 ENCOUNTER — Observation Stay (HOSPITAL_COMMUNITY): Payer: Medicare HMO | Admitting: Certified Registered"

## 2020-12-23 ENCOUNTER — Encounter (HOSPITAL_COMMUNITY)
Admission: EM | Disposition: A | Discharge status: Home / Self Care | Payer: Self-pay | Source: Home / Self Care | Attending: Internal Medicine

## 2020-12-23 DIAGNOSIS — G8929 Other chronic pain: Secondary | ICD-10-CM | POA: Diagnosis present

## 2020-12-23 DIAGNOSIS — K5731 Diverticulosis of large intestine without perforation or abscess with bleeding: Secondary | ICD-10-CM | POA: Diagnosis present

## 2020-12-23 DIAGNOSIS — K317 Polyp of stomach and duodenum: Secondary | ICD-10-CM | POA: Diagnosis present

## 2020-12-23 DIAGNOSIS — F419 Anxiety disorder, unspecified: Secondary | ICD-10-CM | POA: Diagnosis present

## 2020-12-23 DIAGNOSIS — K922 Gastrointestinal hemorrhage, unspecified: Secondary | ICD-10-CM | POA: Diagnosis present

## 2020-12-23 DIAGNOSIS — Z833 Family history of diabetes mellitus: Secondary | ICD-10-CM | POA: Diagnosis not present

## 2020-12-23 DIAGNOSIS — I2510 Atherosclerotic heart disease of native coronary artery without angina pectoris: Secondary | ICD-10-CM | POA: Diagnosis present

## 2020-12-23 DIAGNOSIS — D62 Acute posthemorrhagic anemia: Secondary | ICD-10-CM | POA: Diagnosis present

## 2020-12-23 DIAGNOSIS — K633 Ulcer of intestine: Secondary | ICD-10-CM

## 2020-12-23 DIAGNOSIS — D649 Anemia, unspecified: Secondary | ICD-10-CM | POA: Diagnosis present

## 2020-12-23 DIAGNOSIS — K921 Melena: Secondary | ICD-10-CM | POA: Diagnosis not present

## 2020-12-23 DIAGNOSIS — K625 Hemorrhage of anus and rectum: Secondary | ICD-10-CM

## 2020-12-23 DIAGNOSIS — K589 Irritable bowel syndrome without diarrhea: Secondary | ICD-10-CM | POA: Diagnosis present

## 2020-12-23 DIAGNOSIS — E785 Hyperlipidemia, unspecified: Secondary | ICD-10-CM | POA: Diagnosis present

## 2020-12-23 DIAGNOSIS — D500 Iron deficiency anemia secondary to blood loss (chronic): Secondary | ICD-10-CM | POA: Diagnosis present

## 2020-12-23 DIAGNOSIS — I10 Essential (primary) hypertension: Secondary | ICD-10-CM | POA: Diagnosis present

## 2020-12-23 DIAGNOSIS — Z8249 Family history of ischemic heart disease and other diseases of the circulatory system: Secondary | ICD-10-CM | POA: Diagnosis not present

## 2020-12-23 DIAGNOSIS — Z20822 Contact with and (suspected) exposure to covid-19: Secondary | ICD-10-CM | POA: Diagnosis present

## 2020-12-23 DIAGNOSIS — K648 Other hemorrhoids: Secondary | ICD-10-CM | POA: Diagnosis present

## 2020-12-23 DIAGNOSIS — K551 Chronic vascular disorders of intestine: Secondary | ICD-10-CM | POA: Diagnosis present

## 2020-12-23 DIAGNOSIS — M797 Fibromyalgia: Secondary | ICD-10-CM | POA: Diagnosis present

## 2020-12-23 DIAGNOSIS — K5730 Diverticulosis of large intestine without perforation or abscess without bleeding: Secondary | ICD-10-CM | POA: Diagnosis present

## 2020-12-23 DIAGNOSIS — D75839 Thrombocytosis, unspecified: Secondary | ICD-10-CM | POA: Diagnosis present

## 2020-12-23 DIAGNOSIS — K2211 Ulcer of esophagus with bleeding: Secondary | ICD-10-CM | POA: Diagnosis present

## 2020-12-23 DIAGNOSIS — K449 Diaphragmatic hernia without obstruction or gangrene: Secondary | ICD-10-CM | POA: Diagnosis present

## 2020-12-23 DIAGNOSIS — F32A Depression, unspecified: Secondary | ICD-10-CM | POA: Diagnosis present

## 2020-12-23 DIAGNOSIS — K254 Chronic or unspecified gastric ulcer with hemorrhage: Secondary | ICD-10-CM | POA: Diagnosis present

## 2020-12-23 DIAGNOSIS — E1165 Type 2 diabetes mellitus with hyperglycemia: Secondary | ICD-10-CM | POA: Diagnosis present

## 2020-12-23 HISTORY — PX: COLONOSCOPY WITH PROPOFOL: SHX5780

## 2020-12-23 HISTORY — PX: BIOPSY: SHX5522

## 2020-12-23 LAB — HEMOGLOBIN AND HEMATOCRIT, BLOOD
HCT: 30.1 % — ABNORMAL LOW (ref 36.0–46.0)
Hemoglobin: 8.8 g/dL — ABNORMAL LOW (ref 12.0–15.0)

## 2020-12-23 LAB — BASIC METABOLIC PANEL
Anion gap: 13 (ref 5–15)
BUN: 8 mg/dL (ref 8–23)
CO2: 25 mmol/L (ref 22–32)
Calcium: 8.4 mg/dL — ABNORMAL LOW (ref 8.9–10.3)
Chloride: 100 mmol/L (ref 98–111)
Creatinine, Ser: 0.91 mg/dL (ref 0.44–1.00)
GFR, Estimated: >60 mL/min (ref >60)
Glucose, Bld: 132 mg/dL — ABNORMAL HIGH (ref 70–99)
Potassium: 3.2 mmol/L — ABNORMAL LOW (ref 3.5–5.1)
Sodium: 138 mmol/L (ref 135–145)

## 2020-12-23 LAB — CBC
HCT: 30.4 % — ABNORMAL LOW (ref 36.0–46.0)
Hemoglobin: 9 g/dL — ABNORMAL LOW (ref 12.0–15.0)
MCH: 23.1 pg — ABNORMAL LOW (ref 26.0–34.0)
MCHC: 29.6 g/dL — ABNORMAL LOW (ref 30.0–36.0)
MCV: 78.1 fL — ABNORMAL LOW (ref 80.0–100.0)
Platelets: 475 10*3/uL — ABNORMAL HIGH (ref 150–400)
RBC: 3.89 MIL/uL (ref 3.87–5.11)
RDW: 20.4 % — ABNORMAL HIGH (ref 11.5–15.5)
WBC: 9.5 10*3/uL (ref 4.0–10.5)
nRBC: 0 % (ref 0.0–0.2)

## 2020-12-23 LAB — GLUCOSE, CAPILLARY
Glucose-Capillary: 68 mg/dL — ABNORMAL LOW (ref 70–99)
Glucose-Capillary: 87 mg/dL (ref 70–99)
Glucose-Capillary: 143 mg/dL — ABNORMAL HIGH (ref 70–99)
Glucose-Capillary: 325 mg/dL — ABNORMAL HIGH (ref 70–99)

## 2020-12-23 LAB — HEMOGLOBIN: Hemoglobin: 9.7 g/dL — ABNORMAL LOW (ref 12.0–15.0)

## 2020-12-23 SURGERY — COLONOSCOPY WITH PROPOFOL
Anesthesia: Monitor Anesthesia Care

## 2020-12-23 MED ORDER — POTASSIUM CHLORIDE CRYS ER 20 MEQ PO TBCR
40.0000 meq | EXTENDED_RELEASE_TABLET | ORAL | Status: AC
  Administered 2020-12-23: 40 meq via ORAL
  Filled 2020-12-23: qty 2

## 2020-12-23 MED ORDER — PROPOFOL 500 MG/50ML IV EMUL
INTRAVENOUS | Status: AC
  Filled 2020-12-23: qty 50

## 2020-12-23 MED ORDER — PROPOFOL 500 MG/50ML IV EMUL
INTRAVENOUS | Status: DC | PRN
  Administered 2020-12-23: 125 ug/kg/min via INTRAVENOUS

## 2020-12-23 MED ORDER — ASPIRIN 81 MG PO CHEW
81.0000 mg | CHEWABLE_TABLET | Freq: Every day | ORAL | Status: DC
  Administered 2020-12-24: 81 mg via ORAL
  Filled 2020-12-23: qty 1

## 2020-12-23 MED ORDER — PROPOFOL 10 MG/ML IV BOLUS
INTRAVENOUS | Status: AC
  Filled 2020-12-23: qty 20

## 2020-12-23 MED ORDER — LACTATED RINGERS IV SOLN
INTRAVENOUS | Status: DC
  Administered 2020-12-23: 1000 mL via INTRAVENOUS

## 2020-12-23 SURGICAL SUPPLY — 22 items

## 2020-12-23 NOTE — Transfer of Care (Signed)
Immediate Anesthesia Transfer of Care Note  Patient: Jeanne Kim  Procedure(s) Performed: COLONOSCOPY WITH PROPOFOL (N/A ) BIOPSY  Patient Location: PACU  Anesthesia Type:MAC  Level of Consciousness: awake, alert  and oriented  Airway & Oxygen Therapy: Patient Spontanous Breathing and Patient connected to face mask oxygen  Post-op Assessment: Report given to RN, Post -op Vital signs reviewed and stable and Patient moving all extremities X 4  Post vital signs: Reviewed and stable  Last Vitals:  Vitals Value Taken Time  BP    Temp    Pulse    Resp    SpO2      Last Pain:  Vitals:   12/23/20 0827  TempSrc: Oral  PainSc: 0-No pain      Patients Stated Pain Goal: 0 (11/55/20 8022)  Complications: No complications documented.

## 2020-12-23 NOTE — Anesthesia Preprocedure Evaluation (Addendum)
Anesthesia Evaluation  Patient identified by MRN, date of birth, ID band Patient awake    Reviewed: Allergy & Precautions, NPO status , Patient's Chart, lab work & pertinent test results, reviewed documented beta blocker date and time   History of Anesthesia Complications (+) PONV and history of anesthetic complications  Airway Mallampati: II  TM Distance: >3 FB Neck ROM: Full    Dental no notable dental hx.    Pulmonary sleep apnea and Continuous Positive Airway Pressure Ventilation ,    Pulmonary exam normal breath sounds clear to auscultation       Cardiovascular hypertension, Pt. on medications and Pt. on home beta blockers + CAD  Normal cardiovascular exam Rhythm:Regular Rate:Normal  20-Dec-2020 14:23:26 [facility] ROUTINE RECORD Sinus rhythm Abnormal R-wave progression, early transition Inferior infarct, old Consider anterior infarct Lateral leads are also involved Confirmed by Dewaine Conger [phone]) on 12/21/2020 7:55:16 PM   Neuro/Psych PSYCHIATRIC DISORDERS Anxiety Depression negative neurological ROS     GI/Hepatic Neg liver ROS, hiatal hernia, PUD, GERD  ,  Endo/Other  diabetes, Type 2Hypothyroidism   Renal/GU Renal disease  negative genitourinary   Musculoskeletal  (+) Arthritis , Osteoarthritis,  Fibromyalgia -  Abdominal   Peds negative pediatric ROS (+)  Hematology negative hematology ROS (+) anemia ,   Anesthesia Other Findings   Reproductive/Obstetrics negative OB ROS                             Anesthesia Physical  Anesthesia Plan  ASA: III  Anesthesia Plan: MAC   Post-op Pain Management:    Induction: Intravenous  PONV Risk Score and Plan: 3 and Propofol infusion, TIVA and Treatment may vary due to age or medical condition  Airway Management Planned: Natural Airway and Simple Face Mask  Additional Equipment: None  Intra-op Plan:    Post-operative Plan:   Informed Consent: I have reviewed the patients History and Physical, chart, labs and discussed the procedure including the risks, benefits and alternatives for the proposed anesthesia with the patient or authorized representative who has indicated his/her understanding and acceptance.     Dental advisory given  Plan Discussed with: CRNA, Anesthesiologist and Surgeon  Anesthesia Plan Comments:         Anesthesia Quick Evaluation

## 2020-12-23 NOTE — Anesthesia Procedure Notes (Signed)
Procedure Name: MAC Date/Time: 12/23/2020 9:07 AM Performed by: Niel Hummer, CRNA Pre-anesthesia Checklist: Patient identified, Emergency Drugs available, Suction available and Patient being monitored Oxygen Delivery Method: Simple face mask

## 2020-12-23 NOTE — Progress Notes (Signed)
PROGRESS NOTE    Jeanne Kim  SWF:[id number] DOB: October 03, 1941 DOA: 78/15/2022 PCP: Neale Burly, MD   Chief Complaint  Patient presents with  . Abnormal Lab   Brief Narrative:80 y.o. female with medical history significant for CAD/HTN/HLD/carotid artery disease, T2DM, recent hospitalization for syncopal episode with traumatic subdural hematoma, history of nonbleeding gastric ulcer and erosive esophagitis H. pylori negative on EGD 08/06/2020 was sent to the ED from GI clinic due to low hemoglobin and black tarry stool. Patient was seen at the GI clinic for rectal bleeding and mucus when she was hospitalized with sepsis that had resolved and morning noticed dark tarry stool with mucus similar to what she had when hospitalized in October of last year with a stomach ulcer.  She also complained of being weak and having dizziness.  She had routine blood work CBC CMP done in the clinic that showed hemoglobin significantly lower 7.7 g from 9.2 a month ago and was sent to the ED. On my evaluation -she appears pale weak. Patient otherwise denies any nausea, vomiting, chest pain, shortness of breath, fever, chills, headache, focal weakness, numbness tingling, speech difficulties. She has mild vague abdomen discomfort.  ED Course: Vitals are stable saturating well on room air, lab work further shows potassium 3.1 blood sugar 255, total bili 1.6 hemoglobin down to 7.3 g with microcytosis thrombocytosis.  1 unit PRBC transfusion was ordered, GI was consulted with plan for EGD tomorrow and admission was requested. Patient admitted after 1 unit prbc transfusion.  Hemoglobin improved. Underwent EGD no acute significant finding and planning for colonoscopy S/p colonoscopy 3/18-consistent with ischemic colitis-pending biopsy result,  Subjective:  Seen and examined this morning.  She was on her way to get colonoscopy.  Denied any new complaint nausea vomiting.   Seen post colonoscopy co bright rectal blood  x2  Assessment & Plan:  Symptomatic anemia with hemoglobin 7.3 g Black tarry stool with hx of gastric ulcer and erosive esophagitis in EGD 07/2020 Suspecting Ischemic colitis : EGD no acute significant finding and colonoscopy showed ischemic colitis-pending biopsy result.Patient had a small episode of bright red rectal bleeding likely from multiple biopsy that she has had during colonoscopy, check H&H tonight and in the morning.Continue PPI. Recent Labs  Lab 12/20/20 1415 12/20/20 1650 12/20/20 1852 12/21/20 0545 12/21/20 0747 12/21/20 2312 12/22/20 0725 12/22/20 1801 12/23/20 0036 12/23/20 1057  HGB 7.3*   < > 7.7* 8.4*   < > 9.0* 8.4*  8.3* 8.6* 9.0* 9.7*  HCT 25.6*  --  27.0* 27.9*  --   --  27.9*  --  30.4*  --    < > = values in this interval not displayed.   Severe Hypokalemia:worsened by bowel prep, Lasix,  potassium nicely improved still low we will continue to replete. Recent Labs  Lab 12/20/20 1415 12/21/20 0545 12/22/20 0725 12/22/20 1801 12/23/20 0036  K 3.1* 3.0* 2.9* 3.5 3.2*   Hypomagnesemia:Repleted aggressively.   CAD/HTN/HLD/carotid artery disease: Patient without chest pain blood pressure stable serial troponins negative.  Okay to resume Aspirin per G. continue metoprolol Lasix and Lipitor.  Resume ACEi on d/c.  T2DM:Hyporglycemia episode while npo.  Continue on sliding scale insulin hold home home Metformin and home insulin for now Recent Labs  Lab 12/22/20 1802 12/22/20 2340 12/23/20 0451 12/23/20 0714 12/23/20 1214  GLUCAP 231* 161* 68* 87 143*   Mildly elevated AST- 60-70s.monitor  Hypothyroidism: Continue her Synthroid  IBS continue linaclotide  Fibromyalgia/chronic pain/anxiety/depression:  Pain is stable  no new issues.  Continue her doxepin, Cymbalta, Neurontin.  Thrombocytosis: Suspect reactive monitor.  Remains high but better from 600 range.  Will need outpatient follow-up to document resolution if not hematology referral by  her PCP. Recent Labs  Lab 12/20/20 1143 12/20/20 1415 12/21/20 0545 12/22/20 0725 12/23/20 0036  PLT 628.0* 628* 468* 411* 475*   Overweight with BMI 28.67 kg/m: Outpatient PCP follow-up and weight loss..  Diet Order            Diet regular Room service appropriate? Yes; Fluid consistency: Thin  Diet effective now                 Patient's Body mass index is 28.67 kg/m.  DVT prophylaxis: SCDs Start: 12/20/20 1601 Code Status:   Code Status: Full Code  Family Communication: plan of care discussed with patient at bedside.  Status is: admitted as Observation Remains hospitalized for ongoing anemia work up- egd was negative and s/p colonoscopy today and not having rectal bleed. Dispo: The patient is from: Home              Anticipated d/c is to: Home tomorrow if remains stable.               Patient currently is not medically stable to d/c.   Difficult to place patient No   Unresulted Labs (From admission, onward)          Start     Ordered   12/23/20 0500  CBC  Daily,   R     Question:  Specimen collection method  Answer:  Lab=Lab collect   12/22/20 1123   12/23/20 2993  Basic metabolic panel  Daily,   R     Question:  Specimen collection method  Answer:  Lab=Lab collect   12/22/20 1123   12/20/20 1547  Hemoglobin  Now then every 8 hours,   R (with TIMED occurrences)      12/20/20 1546         Medications reviewed:  Scheduled Meds: . amLODipine  5 mg Oral Daily  . atorvastatin  20 mg Oral Daily  . cholecalciferol  2,000 Units Oral Daily  . doxepin  10 mg Oral QHS  . DULoxetine  60 mg Oral Daily  . ferrous sulfate  325 mg Oral Daily  . fluticasone  2 spray Each Nare Daily  . furosemide  40 mg Oral Once per day on Mon Wed Fri  . gabapentin  300 mg Oral QHS  . insulin aspart  0-9 Units Subcutaneous Q6H  . levothyroxine  137 mcg Oral Daily  . linaclotide  290 mcg Oral Daily  . loratadine  10 mg Oral Daily  . magnesium oxide  400 mg Oral Daily  . metolazone   5 mg Oral Daily  . metoprolol succinate  50 mg Oral Daily  . midodrine  10 mg Oral Daily  . montelukast  10 mg Oral QHS  . pantoprazole (PROTONIX) IV  40 mg Intravenous Q12H  . peg 3350 powder  0.5 kit Oral Once  . potassium chloride  20 mEq Oral Daily   Continuous Infusions:   Consultants:see note  Procedures:see note  Antimicrobials: Anti-infectives (From admission, onward)   None     Culture/Microbiology  Other culture-see note  Objective: Vitals: Today's Vitals   12/23/20 0950 12/23/20 1000 12/23/20 1010 12/23/20 1045  BP: 115/62 121/68 130/63 (!) 145/75  Pulse: 64 61 61 (!) 57  Resp: 12 (!) 21 18 16  Temp:    97.9 F (36.6 C)  TempSrc:    Oral  SpO2: 100% 98% 93% 96%  Weight:      Height:      PainSc: Asleep Asleep 0-No pain     Intake/Output Summary (Last 24 hours) at 12/23/2020 1357 Last data filed at 12/23/2020 0946 Gross per 24 hour  Intake 1127.87 ml  Output --  Net 1127.87 ml   Filed Weights   12/20/20 1341  Weight: 75.8 kg   Weight change:   Intake/Output from previous day: 03/17 0701 - 03/18 0700 In: 627.9 [P.O.:472; I.V.:58.7; IV Piggyback:97.2] Out: -  Intake/Output this shift: Total I/O In: 500 [I.V.:500] Out: -  Filed Weights   12/20/20 1341  Weight: 75.8 kg    Examination: General exam: AAOx3,NAD, weak appearing. HEENT:Oral mucosa moist, Ear/Nose WNL grossly, dentition normal. Respiratory system: bilaterally diminishedd,no wheezing or crackles,no use of accessory muscle Cardiovascular system: S1 & S2 +, No JVD,. Gastrointestinal system: Abdomen soft, NT,ND, BS+ Nervous System:Alert, awake, moving extremities and grossly nonfocal Extremities: No edema, distal peripheral pulses palpable.  Skin: No rashes,no icterus. MSK: Normal muscle bulk,tone, power  Data Reviewed: I have personally reviewed following labs and imaging studies CBC: Recent Labs  Lab 12/20/20 1143 12/20/20 1415 12/20/20 1650 12/20/20 1852 12/21/20 0545  12/21/20 0747 12/21/20 2312 12/22/20 0725 12/22/20 1801 12/23/20 0036 12/23/20 1057  WBC 10.3 9.2  --   --  9.2  --   --  7.0  --  9.5  --   NEUTROABS 6.8 5.9  --   --   --   --   --   --   --   --   --   HGB 7.7 Repeated and verified X2.* 7.3*   < > 7.7* 8.4*   < > 9.0* 8.4*  8.3* 8.6* 9.0* 9.7*  HCT 24.4 Repeated and verified X2.* 25.6*  --  27.0* 27.9*  --   --  27.9*  --  30.4*  --   MCV 68.9 Repeated and verified X2.* 74.2*  --   --  77.3*  --   --  78.4*  --  78.1*  --   PLT 628.0* 628*  --   --  468*  --   --  411*  --  475*  --    < > = values in this interval not displayed.   Basic Metabolic Panel: Recent Labs  Lab 12/20/20 1143 12/20/20 1415 12/21/20 0545 12/22/20 0725 12/22/20 1801 12/23/20 0036  NA 135 136 134* 139  --  138  K 3.6 3.1* 3.0* 2.9* 3.5 3.2*  CL 91* 93* 98 101  --  100  CO2 34* $Remov'29 26 28  'LTDGkb$ --  25  GLUCOSE 214* 255* 123* 163*  --  132*  BUN $Re'18 20 19 12  'DMG$ --  8  CREATININE 1.08 1.11* 1.01* 0.98  --  0.91  CALCIUM 9.0 9.1 8.4* 8.1*  --  8.4*  MG  --   --   --  1.2*  --   --    GFR: Estimated Creatinine Clearance: 51.6 mL/min (by C-G formula based on SCr of 0.91 mg/dL). Liver Function Tests: Recent Labs  Lab 12/20/20 1143 12/20/20 1415 12/21/20 0545  AST 54* 66* 79*  ALT 26 33 39  ALKPHOS 42 42 43  BILITOT 0.8 1.6* 1.2  PROT 7.0 6.8 6.4*  ALBUMIN 3.8 3.7 3.3*   Recent Labs  Lab 12/20/20 1415  LIPASE 23   No results  for input(s): AMMONIA in the last 168 hours. Coagulation Profile: No results for input(s): INR, PROTIME in the last 168 hours. Cardiac Enzymes: No results for input(s): CKTOTAL, CKMB, CKMBINDEX, TROPONINI in the last 168 hours. BNP (last 3 results) Recent Labs    07/04/20 1524  PROBNP 165   HbA1C: Recent Labs    12/20/20 1415  HGBA1C 7.4*   CBG: Recent Labs  Lab 12/22/20 1802 12/22/20 2340 12/23/20 0451 12/23/20 0714 12/23/20 1214  GLUCAP 231* 161* 68* 87 143*   Lipid Profile: No results for input(s):  CHOL, HDL, LDLCALC, TRIG, CHOLHDL, LDLDIRECT in the last 72 hours. Thyroid Function Tests: No results for input(s): TSH, T4TOTAL, FREET4, T3FREE, THYROIDAB in the last 72 hours. Anemia Panel: No results for input(s): VITAMINB12, FOLATE, FERRITIN, TIBC, IRON, RETICCTPCT in the last 72 hours. Sepsis Labs: No results for input(s): PROCALCITON, LATICACIDVEN in the last 168 hours.  Recent Results (from the past 240 hour(s))  Resp Panel by RT-PCR (Flu A&B, Covid) Nasopharyngeal Swab     Status: None   Collection Time: 12/20/20  5:18 PM   Specimen: Nasopharyngeal Swab; Nasopharyngeal(NP) swabs in vial transport medium  Result Value Ref Range Status   SARS Coronavirus 2 by RT PCR NEGATIVE NEGATIVE Final    Comment: (NOTE) SARS-CoV-2 target nucleic acids are NOT DETECTED.  The SARS-CoV-2 RNA is generally detectable in upper respiratory specimens during the acute phase of infection. The lowest concentration of SARS-CoV-2 viral copies this assay can detect is 138 copies/mL. A negative result does not preclude SARS-Cov-2 infection and should not be used as the sole basis for treatment or other patient management decisions. A negative result may occur with  improper specimen collection/handling, submission of specimen other than nasopharyngeal swab, presence of viral mutation(s) within the areas targeted by this assay, and inadequate number of viral copies(<138 copies/mL). A negative result must be combined with clinical observations, patient history, and epidemiological information. The expected result is Negative.  Fact Sheet for Patients:  EntrepreneurPulse.com.au  Fact Sheet for Healthcare Providers:  IncredibleEmployment.be  This test is no t yet approved or cleared by the [place] FDA and  has been authorized for detection and/or diagnosis of SARS-CoV-2 by FDA under an Emergency Use Authorization (EUA). This EUA will remain  in effect (meaning  this test can be used) for the duration of the COVID-19 declaration under Section 564(b)(1) of the Act, 21 U.S.C.section 360bbb-3(b)(1), unless the authorization is terminated  or revoked sooner.       Influenza A by PCR NEGATIVE NEGATIVE Final   Influenza B by PCR NEGATIVE NEGATIVE Final    Comment: (NOTE) The Xpert Xpress SARS-CoV-2/FLU/RSV plus assay is intended as an aid in the diagnosis of influenza from Nasopharyngeal swab specimens and should not be used as a sole basis for treatment. Nasal washings and aspirates are unacceptable for Xpert Xpress SARS-CoV-2/FLU/RSV testing.  Fact Sheet for Patients: EntrepreneurPulse.com.au  Fact Sheet for Healthcare Providers: IncredibleEmployment.be  This test is not yet approved or cleared by the [place] FDA and has been authorized for detection and/or diagnosis of SARS-CoV-2 by FDA under an Emergency Use Authorization (EUA). This EUA will remain in effect (meaning this test can be used) for the duration of the COVID-19 declaration under Section 564(b)(1) of the Act, 21 U.S.C. section 360bbb-3(b)(1), unless the authorization is terminated or revoked.  Performed at [facility], [place] [address] [place], [place] [address]      Radiology Studies: No results found.   LOS:  0 days   Antonieta Pert, MD [facility]  12/23/2020, 1:57 PM

## 2020-12-23 NOTE — Interval H&P Note (Signed)
History and Physical Interval Note:  12/23/2020 8:58 AM  Jeanne Kim  has presented today for surgery, with the diagnosis of Anemia.  The various methods of treatment have been discussed with the patient and family. After consideration of risks, benefits and other options for treatment, the patient has consented to  Procedure(s): COLONOSCOPY WITH PROPOFOL (N/A) as a surgical intervention.  The patient's history has been reviewed, patient examined, no change in status, stable for surgery.  I have reviewed the patient's chart and labs.  Questions were answered to the patient's satisfaction.     Thornton Park

## 2020-12-23 NOTE — Anesthesia Postprocedure Evaluation (Signed)
Anesthesia Post Note  Patient: Jeanne Kim  Procedure(s) Performed: COLONOSCOPY WITH PROPOFOL (N/A ) BIOPSY     Patient location during evaluation: PACU Anesthesia Type: MAC Level of consciousness: awake and alert Pain management: pain level controlled Vital Signs Assessment: post-procedure vital signs reviewed and stable Respiratory status: spontaneous breathing and respiratory function stable Cardiovascular status: stable Postop Assessment: no apparent nausea or vomiting Anesthetic complications: no   No complications documented.  Last Vitals:  Vitals:   12/23/20 0948 12/23/20 0950  BP: (!) 146/55 115/62  Pulse: 67 64  Resp: 11 12  Temp: 36.6 C   SpO2: 100% 100%    Last Pain:  Vitals:   12/23/20 0950  TempSrc:   PainSc: Asleep                 Merlinda Frederick

## 2020-12-23 NOTE — Op Note (Signed)
[facility] Patient Name: Jeanne Kim Procedure Date: 12/23/2020 MRN: [id number] Attending MD: Thornton Park MD, MD Date of Birth: 07/24/1941 CSN: [id number] Age: 80 Admit Type: Inpatient Procedure:                Colonoscopy Indications:              Melena, Rectal bleeding                           Last colonoscopy 2018 with Dr. Silverio Decamp                           EGD 12/21/20 did not reveal a source Providers:                Thornton Park MD, MD, Nelia Shi, RN,                            Tyrone Apple, Technician, Maudry Diego,                            CRNA Referring MD:              Medicines:                Monitored Anesthesia Care Complications:            No immediate complications. Estimated blood loss:                            Minimal. Estimated Blood Loss:     Estimated blood loss was minimal. Procedure:                Pre-Anesthesia Assessment:                           - Prior to the procedure, a History and Physical                            was performed, and patient medications and                            allergies were reviewed. The patient's tolerance of                            previous anesthesia was also reviewed. The risks                            and benefits of the procedure and the sedation                            options and risks were discussed with the patient.                            All questions were answered, and informed consent                            was obtained. Prior Anticoagulants: The patient has  taken no previous anticoagulant or antiplatelet                            agents. ASA Grade Assessment: III - A patient with                            severe systemic disease. After reviewing the risks                            and benefits, the patient was deemed in                            satisfactory condition to undergo the procedure.                            After obtaining informed consent, the colonoscope                            was passed under direct vision. Throughout the                            procedure, the patient's blood pressure, pulse, and                            oxygen saturations were monitored continuously. The                            PCF-H190DL ([id number]) Olympus pediatric colonscope                            was introduced through the anus and advanced to the                            3 cm into the ileum. The colonoscopy was performed                            with moderate difficulty due to a redundant colon.                            Successful completion of the procedure was aided by                            applying abdominal pressure. The patient tolerated                            the procedure well. The quality of the bowel                            preparation was good although there was some stool                            present throughout the colon limiting a full  evaluation. The terminal ileum, ileocecal valve,                            appendiceal orifice, and rectum were photographed. Scope In: 9:17:01 AM Scope Out: 9:42:29 AM Scope Withdrawal Time: 0 hours 13 minutes 1 second  Total Procedure Duration: 0 hours 25 minutes 28 seconds  Findings:      Hemorrhoids were found on perianal exam.      Non-bleeding internal hemorrhoids were found.      A few small-mouthed diverticula were found in the sigmoid colon.      A continuous, linear area of nonbleeding ulcerated mucosa with stigmata       of recent bleeding was present in the distal sigmoid colon from 20-30       cm. Biopsies were taken with a cold forceps for histology. Estimated       blood loss was minimal.      Some liquid stool present throughout the colon limiting a full       evaluation for small and flat polyps. The exam was otherwise without       abnormality on direct and retroflexion  views. Impression:               - Hemorrhoids found on perianal exam.                           - Non-bleeding internal hemorrhoids.                           - Diverticulosis in the sigmoid colon.                           - Distal sigmoid ulceration. Suspicious for                            ischemic colitis. Biopsied.                           - The examination was otherwise normal on direct                            and retroflexion views. Moderate Sedation:      Not Applicable - Patient had care per Anesthesia. Recommendation:           - Return patient to hospital ward for ongoing care.                           - Advance diet as tolerated.                           - Continue present medications.                           - Await pathology results.                           - CTA if biopsy confirm ischemic colitis. Procedure Code(s):        --- Professional ---  45380, Colonoscopy, flexible; with biopsy, single                            or multiple Diagnosis Code(s):        --- Professional ---                           K64.8, Other hemorrhoids                           K63.3, Ulcer of intestine                           K92.1, Melena (includes Hematochezia)                           K62.5, Hemorrhage of anus and rectum                           K57.30, Diverticulosis of large intestine without                            perforation or abscess without bleeding CPT copyright 2019 American Medical Association. All rights reserved. The codes documented in this report are preliminary and upon coder review may  be revised to meet current compliance requirements. Thornton Park MD, MD 12/23/2020 10:01:32 AM This report has been signed electronically. Number of Addenda: 0

## 2020-12-24 LAB — BASIC METABOLIC PANEL
Anion gap: 9 (ref 5–15)
BUN: 11 mg/dL (ref 8–23)
CO2: 29 mmol/L (ref 22–32)
Calcium: 8.9 mg/dL (ref 8.9–10.3)
Chloride: 97 mmol/L — ABNORMAL LOW (ref 98–111)
Creatinine, Ser: 1.01 mg/dL — ABNORMAL HIGH (ref 0.44–1.00)
GFR, Estimated: 57 mL/min — ABNORMAL LOW (ref >60)
Glucose, Bld: 243 mg/dL — ABNORMAL HIGH (ref 70–99)
Potassium: 3.7 mmol/L (ref 3.5–5.1)
Sodium: 135 mmol/L (ref 135–145)

## 2020-12-24 LAB — CBC
HCT: 27.9 % — ABNORMAL LOW (ref 36.0–46.0)
Hemoglobin: 8.2 g/dL — ABNORMAL LOW (ref 12.0–15.0)
MCH: 23.2 pg — ABNORMAL LOW (ref 26.0–34.0)
MCHC: 29.4 g/dL — ABNORMAL LOW (ref 30.0–36.0)
MCV: 78.8 fL — ABNORMAL LOW (ref 80.0–100.0)
Platelets: 439 10*3/uL — ABNORMAL HIGH (ref 150–400)
RBC: 3.54 MIL/uL — ABNORMAL LOW (ref 3.87–5.11)
RDW: 20.4 % — ABNORMAL HIGH (ref 11.5–15.5)
WBC: 9.7 10*3/uL (ref 4.0–10.5)
nRBC: 0 % (ref 0.0–0.2)

## 2020-12-24 LAB — GLUCOSE, CAPILLARY
Glucose-Capillary: 224 mg/dL — ABNORMAL HIGH (ref 70–99)
Glucose-Capillary: 229 mg/dL — ABNORMAL HIGH (ref 70–99)
Glucose-Capillary: 255 mg/dL — ABNORMAL HIGH (ref 70–99)

## 2020-12-24 MED ORDER — METRONIDAZOLE 500 MG PO TABS: 500.0000 mg | ORAL_TABLET | Freq: Three times a day (TID) | ORAL | Status: DC

## 2020-12-24 MED ORDER — INSULIN GLARGINE 100 UNIT/ML ~~LOC~~ SOLN
12.0000 [IU] | Freq: Every day | SUBCUTANEOUS | Status: DC
  Administered 2020-12-24: 12 [IU] via SUBCUTANEOUS
  Filled 2020-12-24: qty 0.12

## 2020-12-24 MED ORDER — POTASSIUM CHLORIDE ER 10 MEQ PO TBCR: 10.0000 meq | EXTENDED_RELEASE_TABLET | Freq: Every day | ORAL | 0 refills | Status: DC

## 2020-12-24 MED ORDER — CIPROFLOXACIN HCL 500 MG PO TABS: 500.0000 mg | ORAL_TABLET | Freq: Two times a day (BID) | ORAL | 0 refills | Status: AC

## 2020-12-24 MED ORDER — CIPROFLOXACIN HCL 500 MG PO TABS: 500.0000 mg | ORAL_TABLET | Freq: Two times a day (BID) | ORAL | Status: DC

## 2020-12-24 MED ORDER — METRONIDAZOLE 500 MG PO TABS: 500.0000 mg | ORAL_TABLET | Freq: Three times a day (TID) | ORAL | 0 refills | Status: AC

## 2020-12-24 NOTE — Progress Notes (Signed)
     [facility] Progress Note  CC:  Rectal bleeding  Assessment / Plan: Colitis - likely ischemic colitis - presenting with rectal bleeding. Endoscopically, this was not a transmural process.  Clinically improving with conservative management. Although endoscopic findings were mild, given her advanced age and ongoing bleeding, I will recommend the addition of antibiotics.  Advance diet today. Etiology of ischemic colitis is unclear. Biopsy results likely not available until 12/26/20.  If colon biopsies confirm ischemia, would recommend CTA. Could be performed as an outpatient.   GI blood loss anemia - Continue serial hgb/hct with transfusion as indicated.   History of gastric ulcers and esophagitis - not seen on EGD during this hospitalization. Avoid NSAIDs. Continue PPI.   Gastric polyps - Fundic gland polyps. Reviewed pathology results with the patient today and offered reassurance that these are not associated with cancer. She had significant anxiety surrounding the results.   Subjective: Had some bleeding after her colonoscopy yesterday. No abdominal pain. GI ROS is otherwise negative. No family present at the time of my evaluation  Objective:  Vital signs in last 24 hours: Temp:  [97.8 F (36.6 C)-98 F (36.7 C)] 97.8 F (36.6 C) (03/19 0520) Pulse Rate:  [57-74] 60 (03/19 0520) Resp:  [11-21] 16 (03/19 0520) BP: (115-146)/(55-75) 126/62 (03/19 0520) SpO2:  [93 %-100 %] 99 % (03/19 0520) Last BM Date: 12/23/20 General:   Alert, in NAD Heart:  Regular rate and rhythm; no murmurs Pulm: Clear anteriorly; no wheezing Abdomen:  Soft. Nontender. Nondistended. Normal bowel sounds. No rebound or guarding. LAD: No inguinal or umbilical LAD Extremities:  Without edema. Neurologic:  Alert and  oriented x4;  grossly normal neurologically. Psych:  Alert and cooperative. Normal mood and affect.  Lab Results: Recent Labs    12/22/20 0725 12/22/20 1801 12/23/20 0036  12/23/20 1057 12/23/20 2000 12/24/20 0354  WBC 7.0  --  9.5  --   --  9.7  HGB 8.4*  8.3*   < > 9.0* 9.7* 8.8* 8.2*  HCT 27.9*  --  30.4*  --  30.1* 27.9*  PLT 411*  --  475*  --   --  439*   < > = values in this interval not displayed.   BMET Recent Labs    12/22/20 0725 12/22/20 1801 12/23/20 0036 12/24/20 0354  NA 139  --  138 135  K 2.9* 3.5 3.2* 3.7  CL 101  --  100 97*  CO2 28  --  25 29  GLUCOSE 163*  --  132* 243*  BUN 12  --  8 11  CREATININE 0.98  --  0.91 1.01*  CALCIUM 8.1*  --  8.4* 8.9     LOS: 1 day   Thornton Park  12/24/2020, 9:25 AM

## 2020-12-24 NOTE — Discharge Summary (Signed)
homePhysician Discharge Summary  Jeanne Kim WVP:[id number] DOB: 08/10/1941 DOA: 12/20/2020  PCP: Neale Burly, MD  Admit date: 12/20/2020 Discharge date: 12/24/2020  Admitted From: home Disposition:  home  Recommendations for Outpatient Follow-up:  1. Follow up with PCP in 1-2 weeks 2. Please obtain BMP/CBC in one week 3. Please follow up on the following pending results: Biopsy results from colonoscopy  Home Health:no  Equipment/Devices: none  Discharge Condition: Stable Code Status:   Code Status: Full Code Diet recommendation:  Diet Order            Diet Carb Modified Fluid consistency: Thin; Room service appropriate? Yes  Diet effective now           Diet Carb Modified                  Brief/Interim Summary: 80 y.o.femalewith medical history significant forCAD/HTN/HLD/carotid artery disease, T2DM,recent hospitalization for syncopal episode with traumatic subdural hematoma,history of nonbleeding gastric ulcer and erosive esophagitis H. pylori negative on EGD 08/06/2020 was sent to the ED from GI clinic due to low hemoglobin and black tarry stool. Patient was seen at the GI clinic for rectal bleeding and mucus when she was hospitalized with sepsis that had resolved and morning noticed dark tarry stool with mucus similar to what she had when hospitalized in October of last year with a stomach ulcer.She also complained of being weak and having dizziness. She had routine blood work CBC CMP done in the clinic that showed hemoglobin significantly lower 7.7 g from 9.2 a month ago and was sent to the ED. On my evaluation -she appears pale weak.Patient otherwise denies any nausea, vomiting, chest pain, shortness of breath, fever, chills, headache, focal weakness, numbness tingling, speech difficulties. She has mild vague abdomen discomfort.  ED Course:Vitals are stable saturating well on room air,lab work further shows potassium 3.1 blood sugar 255, total bili 1.6  hemoglobin down to 7.3 g with microcytosis thrombocytosis.1 unit PRBC transfusion was ordered, GI was consulted with plan for EGD tomorrow and admission was requested. Patient admitted after 1 unit prbc transfusion.  Hemoglobin improved. Underwent EGD no acute significant finding and planning for colonoscopy S/p colonoscopy 3/18-consistent with ischemic colitis-pending biopsy result-and biopsy result confirms the finding Dr Modena Nunnery is planning for getting CTA on outpatient basis. Post colonoscopy had episodes of bright red bleeding likely from multiple biopsies. H&H-monitored overnight remained stable above 8 g.  Patient was placed on Cipro Flagyl, discussed with Dr. Martina Sinner okay to discharge home on 5 days of Cipro Flagyl, repeat CBC outpatient follow-up with GI for biopsy result.   Discharge Diagnoses:   Symptomatic anemia with hemoglobin 7.3 g w/ Black tarry stool with hx of gastric ulcer and erosive esophagitis in EGD 07/2020-Suspecting Ischemic colitis based on COLOnoscopy: seen by GI, EGD no acute significant finding and colonoscopy showed ischemic colitis-pending biopsy result.Patient had   bright red rectal bleeding expected post colonoscopy biopsy as per GI placed on oral antibiotics, tolerating diet no abdominal tenderness or pain and okay discharge home.Outpatient follow-up for biopsy result and possible CTA Abdomen. CBC in 1 month Recent Labs  Lab 12/21/20 0545 12/21/20 0747 12/22/20 0725 12/22/20 1801 12/23/20 0036 12/23/20 1057 12/23/20 2000 12/24/20 0354  HGB 8.4*   < > 8.4*  8.3* 8.6* 9.0* 9.7* 8.8* 8.2*  HCT 27.9*  --  27.9*  --  30.4*  --  30.1* 27.9*   < > = values in this interval not displayed.   Severe Hypokalemia: Repleted  and resolved.   Recent Labs  Lab 12/21/20 0545 12/22/20 0725 12/22/20 1801 12/23/20 0036 12/24/20 0354  K 3.0* 2.9* 3.5 3.2* 3.7   Hypomagnesemia: Repleted  CAD/HTN/HLD/carotid artery disease: No chest pain blood pressure is stable  serial troponins are negative on admission.  She will continue her home aspirin metoprolol Lasix Lipitor and ACEI.  T2DM:Hyporglycemia episode while npo.  Blood sugar Uptrending resume home insulin Recent Labs  Lab 12/23/20 1214 12/23/20 1717 12/24/20 0004 12/24/20 0522 12/24/20 0851  GLUCAP 143* 325* 255* 229* 224*   Mildly elevated AST- 60-70s.monitor  Hypothyroidism: Continue her Synthroid  IBS continue linaclotide  Fibromyalgia/chronic pain/anxiety/depression:  Pain is stable no new issues.  Continue her doxepin, Cymbalta, Neurontin.  Thrombocytosis: Suspect reactive monitor.  Remains high but better from 600 range.  Will need outpatient follow-up to document resolution if not hematology referral by her PCP. Recent Labs  Lab 12/20/20 1415 12/21/20 0545 12/22/20 0725 12/23/20 0036 12/24/20 0354  PLT 628* 468* 411* 475* 439*   Overweight with BMI 28.67 kg/m: Outpatient PCP follow-up and weight loss..  Consults:  Gastroenterology  Subjective: Alert awake oriented no abdominal pain tenderness.  She reports she saw Dr. Sabra Heck this morning and she feels ready for home today.  She has had small amount of rectal bleeding as expected from colonoscopy.  Discharge Exam: Vitals:   12/24/20 0520 12/24/20 0917  BP: 126/62 129/64  Pulse: 60 68  Resp: 16   Temp: 97.8 F (36.6 C)   SpO2: 99%    General: Pt is alert, awake, not in acute distress Cardiovascular: RRR, S1/S2 +, no rubs, no gallops Respiratory: CTA bilaterally, no wheezing, no rhonchi Abdominal: Soft, NT, ND, bowel sounds + Extremities: no edema, no cyanosis  Discharge Instructions  Discharge Instructions    Diet Carb Modified   Complete by: As directed    Discharge instructions   Complete by: As directed    Check cbc in 1 week   F/u with Dr Tarri Glenn for biopsy report and further testing  Please call call MD or return to ER for similar or worsening recurring problem that brought you to hospital or  if any fever,nausea/vomiting,abdominal pain, uncontrolled pain, chest pain,  shortness of breath or any other alarming symptoms.  Please follow-up your doctor as instructed in a week time and call the office for appointment.  Please avoid alcohol, smoking, or any other illicit substance and maintain healthy habits including taking your regular medications as prescribed.  You were cared for by a hospitalist during your hospital stay. If you have any questions about your discharge medications or the care you received while you were in the hospital after you are discharged, you can call the unit and ask to speak with the hospitalist on call if the hospitalist that took care of you is not available.  Once you are discharged, your primary care physician will handle any further medical issues. Please note that NO REFILLS for any discharge medications will be authorized once you are discharged, as it is imperative that you return to your primary care physician (or establish a relationship with a primary care physician if you do not have one) for your aftercare needs so that they can reassess your need for medications and monitor your lab values   Increase activity slowly   Complete by: As directed      Allergies as of 12/24/2020      Reactions   Codeine Hives   Sulfonamide Derivatives Hives   Canagliflozin  Penicillins Hives   Pt states she does NOT have allergy to penicillin   Sulfa Antibiotics Other (See Comments)   unknown      Medication List    STOP taking these medications   tiZANidine 4 MG tablet Commonly known as: ZANAFLEX     TAKE these medications   acetaminophen 500 MG tablet Commonly known as: TYLENOL Take 1 tablet (500 mg total) by mouth every 8 (eight) hours as needed for moderate pain or headache.   amLODipine 5 MG tablet Commonly known as: NORVASC Take 5 mg by mouth daily.   aspirin 81 MG chewable tablet Chew 1 tablet (81 mg total) by mouth daily.   atorvastatin  20 MG tablet Commonly known as: LIPITOR Take 1 tablet (20 mg total) by mouth daily.   CALCIUM-MAGNESIUM-ZINC PO Take 3 tablets by mouth daily.   ciprofloxacin 500 MG tablet Commonly known as: CIPRO Take 1 tablet (500 mg total) by mouth 2 (two) times daily for 5 days.   Co Q-10 100 MG Caps Take 100 mg by mouth daily.   cyanocobalamin 1000 MCG/ML injection Commonly known as: (VITAMIN B-12) INJECT 1 ML INTO MUSCLE ONCE A MONTH What changed: See the new instructions.   doxepin 10 MG capsule Commonly known as: SINEQUAN Take 10 mg by mouth at bedtime.   DULoxetine 60 MG capsule Commonly known as: CYMBALTA Take 60 mg by mouth daily.   EPINEPHrine 0.3 mg/0.3 mL Soaj injection Commonly known as: EPI-PEN Inject 0.3 mg into the muscle daily as needed for anaphylaxis.   ferrous sulfate 325 (65 FE) MG tablet Take 325 mg by mouth daily.   fexofenadine 30 MG tablet Commonly known as: ALLEGRA Take 30 mg by mouth daily.   Fish Oil 1000 MG Caps Take 1 capsule by mouth daily.   fluticasone 50 MCG/ACT nasal spray Commonly known as: FLONASE Place 2 sprays into the nose daily.   furosemide 40 MG tablet Commonly known as: LASIX Take 1 tablet (40 mg total) by mouth 3 (three) times a week. Take on Monday/Wednesday/Friday   gabapentin 300 MG capsule Commonly known as: NEURONTIN Take 300 mg by mouth at bedtime.   gentamicin 0.3 % ophthalmic solution Commonly known as: GARAMYCIN Place 1 drop into both eyes 3 (three) times daily as needed (red or irritated eyes).   hydrOXYzine 25 MG capsule Commonly known as: VISTARIL Take 50 mg by mouth every 8 (eight) hours as needed for anxiety or itching.   Insulin Glargine-Lixisenatide 100-33 UNT-MCG/ML Sopn Inject 20-25 Units into the skin daily. Pt adjust between 20-25 units   linaclotide 290 MCG Caps capsule Commonly known as: Linzess Take 1 capsule (290 mcg total) by mouth daily.   magnesium oxide 400 MG tablet Commonly known as:  MAG-OX Take 400 mg by mouth daily.   meclizine 25 MG tablet Commonly known as: ANTIVERT Take 1 tablet (25 mg total) by mouth 3 (three) times daily as needed for dizziness.   Melatonin 10 MG Tabs Take 10 mg by mouth at bedtime as needed (sleep).   metFORMIN 1000 MG tablet Commonly known as: GLUCOPHAGE Take 1,000 mg by mouth 2 (two) times daily with a meal.   metolazone 5 MG tablet Commonly known as: ZAROXOLYN Take 5 mg by mouth daily.   metoprolol succinate 50 MG 24 hr tablet Commonly known as: TOPROL-XL TAKE 1 TABLET BY MOUTH EVERY DAY   metroNIDAZOLE 500 MG tablet Commonly known as: FLAGYL Take 1 tablet (500 mg total) by mouth every 8 (eight) hours  for 5 days.   midodrine 10 MG tablet Commonly known as: PROAMATINE Take 10 mg by mouth daily.   montelukast 10 MG tablet Commonly known as: SINGULAIR Take 10 mg by mouth at bedtime.   Needles & Syringes Misc 25G 5/8 inch needle on 3 ml syringe-Use once monthly for B12 injections   nitroGLYCERIN 0.4 MG SL tablet Commonly known as: NITROSTAT PLACE 1 TABLET (0.4 MG TOTAL) UNDER THE TONGUE EVERY 5 (FIVE) MINUTES AS NEEDED FOR CHEST PAIN.   NovoLOG FlexPen 100 UNIT/ML FlexPen Generic drug: insulin aspart Inject 0-20 Units into the skin daily. At night , on sliding scale.   nystatin 100000 UNIT/ML suspension Commonly known as: MYCOSTATIN Use as directed 5 mLs in the mouth or throat daily as needed (mouthwash).   ondansetron 4 MG tablet Commonly known as: Zofran Take 1 tablet (4 mg total) by mouth every 8 (eight) hours as needed for nausea or vomiting.   OneTouch Verio test strip Generic drug: glucose blood 1 each by Other route as directed.   pantoprazole 40 MG tablet Commonly known as: PROTONIX Take 1 tablet (40 mg total) by mouth 2 (two) times daily.   potassium chloride 10 MEQ tablet Commonly known as: KLOR-CON Take 1 tablet (10 mEq total) by mouth daily for 7 days.   ramipril 5 MG capsule Commonly known as:  ALTACE Take 1 capsule (5 mg total) by mouth at bedtime.   Synthroid 137 MCG tablet Generic drug: levothyroxine Take 137 mcg by mouth daily.   traMADol 50 MG tablet Commonly known as: ULTRAM Take 50 mg by mouth daily as needed for moderate pain.   Vitamin D3 50 MCG (2000 UT) capsule Take 2,000 Units by mouth daily.       Allergies  Allergen Reactions  . Codeine Hives  . Sulfonamide Derivatives Hives  . Canagliflozin   . Penicillins Hives    Pt states she does NOT have allergy to penicillin  . Sulfa Antibiotics Other (See Comments)    unknown    The results of significant diagnostics from this hospitalization (including imaging, microbiology, ancillary and laboratory) are listed below for reference.    Microbiology: Recent Results (from the past 240 hour(s))  Resp Panel by RT-PCR (Flu A&B, Covid) Nasopharyngeal Swab     Status: None   Collection Time: 12/20/20  5:18 PM   Specimen: Nasopharyngeal Swab; Nasopharyngeal(NP) swabs in vial transport medium  Result Value Ref Range Status   SARS Coronavirus 2 by RT PCR NEGATIVE NEGATIVE Final    Comment: (NOTE) SARS-CoV-2 target nucleic acids are NOT DETECTED.  The SARS-CoV-2 RNA is generally detectable in upper respiratory specimens during the acute phase of infection. The lowest concentration of SARS-CoV-2 viral copies this assay can detect is 138 copies/mL. A negative result does not preclude SARS-Cov-2 infection and should not be used as the sole basis for treatment or other patient management decisions. A negative result may occur with  improper specimen collection/handling, submission of specimen other than nasopharyngeal swab, presence of viral mutation(s) within the areas targeted by this assay, and inadequate number of viral copies(<138 copies/mL). A negative result must be combined with clinical observations, patient history, and epidemiological information. The expected result is Negative.  Fact Sheet for  Patients:  EntrepreneurPulse.com.au  Fact Sheet for Healthcare Providers:  IncredibleEmployment.be  This test is no t yet approved or cleared by the [place] FDA and  has been authorized for detection and/or diagnosis of SARS-CoV-2 by FDA under an Emergency Use Authorization (EUA).  This EUA will remain  in effect (meaning this test can be used) for the duration of the COVID-19 declaration under Section 564(b)(1) of the Act, 21 U.S.C.section 360bbb-3(b)(1), unless the authorization is terminated  or revoked sooner.       Influenza A by PCR NEGATIVE NEGATIVE Final   Influenza B by PCR NEGATIVE NEGATIVE Final    Comment: (NOTE) The Xpert Xpress SARS-CoV-2/FLU/RSV plus assay is intended as an aid in the diagnosis of influenza from Nasopharyngeal swab specimens and should not be used as a sole basis for treatment. Nasal washings and aspirates are unacceptable for Xpert Xpress SARS-CoV-2/FLU/RSV testing.  Fact Sheet for Patients: EntrepreneurPulse.com.au  Fact Sheet for Healthcare Providers: IncredibleEmployment.be  This test is not yet approved or cleared by the [place] FDA and has been authorized for detection and/or diagnosis of SARS-CoV-2 by FDA under an Emergency Use Authorization (EUA). This EUA will remain in effect (meaning this test can be used) for the duration of the COVID-19 declaration under Section 564(b)(1) of the Act, 21 U.S.C. section 360bbb-3(b)(1), unless the authorization is terminated or revoked.  Performed at [facility], [place] [address] [place], [place] [address]     Procedures/Studies: No results found.  Labs: BNP (last 3 results) Recent Labs    08/05/20 1806  BNP 23.5   Basic Metabolic Panel: Recent Labs  Lab 12/20/20 1415 12/21/20 0545 12/22/20 0725 12/22/20 1801 12/23/20 0036 12/24/20 0354  NA 136 134* 139  --  138 135  K 3.1* 3.0*  2.9* 3.5 3.2* 3.7  CL 93* 98 101  --  100 97*  CO2 29 26 28   --  25 29  GLUCOSE 255* 123* 163*  --  132* 243*  BUN 20 19 12   --  8 11  CREATININE 1.11* 1.01* 0.98  --  0.91 1.01*  CALCIUM 9.1 8.4* 8.1*  --  8.4* 8.9  MG  --   --  1.2*  --   --   --    Liver Function Tests: Recent Labs  Lab 12/20/20 1143 12/20/20 1415 12/21/20 0545  AST 54* 66* 79*  ALT 26 33 39  ALKPHOS 42 42 43  BILITOT 0.8 1.6* 1.2  PROT 7.0 6.8 6.4*  ALBUMIN 3.8 3.7 3.3*   Recent Labs  Lab 12/20/20 1415  LIPASE 23   No results for input(s): AMMONIA in the last 168 hours. CBC: Recent Labs  Lab 12/20/20 1143 12/20/20 1415 12/20/20 1650 12/21/20 0545 12/21/20 0747 12/22/20 0725 12/22/20 1801 12/23/20 0036 12/23/20 1057 12/23/20 2000 12/24/20 0354  WBC 10.3 9.2  --  9.2  --  7.0  --  9.5  --   --  9.7  NEUTROABS 6.8 5.9  --   --   --   --   --   --   --   --   --   HGB 7.7 Repeated and verified X2.* 7.3*   < > 8.4*   < > 8.4*  8.3* 8.6* 9.0* 9.7* 8.8* 8.2*  HCT 24.4 Repeated and verified X2.* 25.6*   < > 27.9*  --  27.9*  --  30.4*  --  30.1* 27.9*  MCV 68.9 Repeated and verified X2.* 74.2*  --  77.3*  --  78.4*  --  78.1*  --   --  78.8*  PLT 628.0* 628*  --  468*  --  411*  --  475*  --   --  439*   < > = values  in this interval not displayed.   Cardiac Enzymes: No results for input(s): CKTOTAL, CKMB, CKMBINDEX, TROPONINI in the last 168 hours. BNP: Invalid input(s): POCBNP CBG: Recent Labs  Lab 12/23/20 1214 12/23/20 1717 12/24/20 0004 12/24/20 0522 12/24/20 0851  GLUCAP 143* 325* 255* 229* 224*   D-Dimer No results for input(s): DDIMER in the last 72 hours. Hgb A1c No results for input(s): HGBA1C in the last 72 hours. Lipid Profile No results for input(s): CHOL, HDL, LDLCALC, TRIG, CHOLHDL, LDLDIRECT in the last 72 hours. Thyroid function studies No results for input(s): TSH, T4TOTAL, T3FREE, THYROIDAB in the last 72 hours.  Invalid input(s): FREET3 Anemia work up No  results for input(s): VITAMINB12, FOLATE, FERRITIN, TIBC, IRON, RETICCTPCT in the last 72 hours. Urinalysis    Component Value Date/Time   COLORURINE YELLOW 09/28/2020 0427   APPEARANCEUR CLEAR 09/28/2020 0427   LABSPEC 1.019 09/28/2020 0427   PHURINE 5.0 09/28/2020 0427   GLUCOSEU NEGATIVE 09/28/2020 0427   HGBUR SMALL (A) 09/28/2020 0427   BILIRUBINUR NEGATIVE 09/28/2020 0427   KETONESUR NEGATIVE 09/28/2020 0427   PROTEINUR NEGATIVE 09/28/2020 0427   NITRITE NEGATIVE 09/28/2020 0427   LEUKOCYTESUR NEGATIVE 09/28/2020 0427   Sepsis Labs Invalid input(s): PROCALCITONIN,  WBC,  LACTICIDVEN Microbiology Recent Results (from the past 240 hour(s))  Resp Panel by RT-PCR (Flu A&B, Covid) Nasopharyngeal Swab     Status: None   Collection Time: 12/20/20  5:18 PM   Specimen: Nasopharyngeal Swab; Nasopharyngeal(NP) swabs in vial transport medium  Result Value Ref Range Status   SARS Coronavirus 2 by RT PCR NEGATIVE NEGATIVE Final    Comment: (NOTE) SARS-CoV-2 target nucleic acids are NOT DETECTED.  The SARS-CoV-2 RNA is generally detectable in upper respiratory specimens during the acute phase of infection. The lowest concentration of SARS-CoV-2 viral copies this assay can detect is 138 copies/mL. A negative result does not preclude SARS-Cov-2 infection and should not be used as the sole basis for treatment or other patient management decisions. A negative result may occur with  improper specimen collection/handling, submission of specimen other than nasopharyngeal swab, presence of viral mutation(s) within the areas targeted by this assay, and inadequate number of viral copies(<138 copies/mL). A negative result must be combined with clinical observations, patient history, and epidemiological information. The expected result is Negative.  Fact Sheet for Patients:  EntrepreneurPulse.com.au  Fact Sheet for Healthcare Providers:   IncredibleEmployment.be  This test is no t yet approved or cleared by the [place] FDA and  has been authorized for detection and/or diagnosis of SARS-CoV-2 by FDA under an Emergency Use Authorization (EUA). This EUA will remain  in effect (meaning this test can be used) for the duration of the COVID-19 declaration under Section 564(b)(1) of the Act, 21 U.S.C.section 360bbb-3(b)(1), unless the authorization is terminated  or revoked sooner.       Influenza A by PCR NEGATIVE NEGATIVE Final   Influenza B by PCR NEGATIVE NEGATIVE Final    Comment: (NOTE) The Xpert Xpress SARS-CoV-2/FLU/RSV plus assay is intended as an aid in the diagnosis of influenza from Nasopharyngeal swab specimens and should not be used as a sole basis for treatment. Nasal washings and aspirates are unacceptable for Xpert Xpress SARS-CoV-2/FLU/RSV testing.  Fact Sheet for Patients: EntrepreneurPulse.com.au  Fact Sheet for Healthcare Providers: IncredibleEmployment.be  This test is not yet approved or cleared by the [place] FDA and has been authorized for detection and/or diagnosis of SARS-CoV-2 by FDA under an Emergency Use Authorization (EUA). This EUA will  remain in effect (meaning this test can be used) for the duration of the COVID-19 declaration under Section 564(b)(1) of the Act, 21 U.S.C. section 360bbb-3(b)(1), unless the authorization is terminated or revoked.  Performed at [facility], [place] [address], [place], [place] [address]      Time coordinating discharge: 25 minutes  SIGNED: Antonieta Pert, MD  [facility] 12/24/2020, 10:28 AM  If 7PM-7AM, please contact night-coverage [email or web address]

## 2020-12-26 ENCOUNTER — Encounter (HOSPITAL_COMMUNITY): Payer: Self-pay | Admitting: Gastroenterology

## 2020-12-26 LAB — SURGICAL PATHOLOGY

## 2021-01-10 ENCOUNTER — Ambulatory Visit: Payer: Medicare HMO | Admitting: Gastroenterology

## 2021-03-23 ENCOUNTER — Ambulatory Visit: Payer: Medicare HMO | Admitting: Gastroenterology

## 2021-04-04 ENCOUNTER — Telehealth: Payer: Self-pay | Admitting: Gastroenterology

## 2021-04-04 MED ORDER — LINACLOTIDE 290 MCG PO CAPS: 290.0000 ug | ORAL_CAPSULE | Freq: Every day | ORAL | 0 refills | Status: DC

## 2021-04-04 NOTE — Telephone Encounter (Signed)
Inbound call from pt stating that she needs a refill for Linzess. Please advise. Thanks

## 2021-04-04 NOTE — Telephone Encounter (Signed)
Rx sent, but patient needs office visit to follow up on ischemic colitis for additional refills (as requested by Dr Tarri Glenn... see surgical pathology note from 12/23/20)

## 2021-04-25 ENCOUNTER — Ambulatory Visit: Payer: Medicare HMO | Admitting: Gastroenterology

## 2021-05-02 ENCOUNTER — Encounter (HOSPITAL_COMMUNITY): Payer: Self-pay

## 2021-05-02 ENCOUNTER — Emergency Department (HOSPITAL_COMMUNITY): Payer: Medicare HMO

## 2021-05-02 ENCOUNTER — Emergency Department (HOSPITAL_COMMUNITY)
Admission: EM | Admit: 2021-05-02 | Discharge: 2021-05-02 | Disposition: A | Discharge status: Home / Self Care | Payer: Medicare HMO | Attending: Emergency Medicine | Admitting: Emergency Medicine

## 2021-05-02 DIAGNOSIS — R5383 Other fatigue: Secondary | ICD-10-CM | POA: Insufficient documentation

## 2021-05-02 DIAGNOSIS — Z79899 Other long term (current) drug therapy: Secondary | ICD-10-CM | POA: Insufficient documentation

## 2021-05-02 DIAGNOSIS — I10 Essential (primary) hypertension: Secondary | ICD-10-CM | POA: Insufficient documentation

## 2021-05-02 DIAGNOSIS — R531 Weakness: Secondary | ICD-10-CM | POA: Insufficient documentation

## 2021-05-02 DIAGNOSIS — E039 Hypothyroidism, unspecified: Secondary | ICD-10-CM | POA: Insufficient documentation

## 2021-05-02 DIAGNOSIS — R55 Syncope and collapse: Secondary | ICD-10-CM | POA: Diagnosis not present

## 2021-05-02 DIAGNOSIS — Z853 Personal history of malignant neoplasm of breast: Secondary | ICD-10-CM | POA: Diagnosis not present

## 2021-05-02 DIAGNOSIS — Z7982 Long term (current) use of aspirin: Secondary | ICD-10-CM | POA: Insufficient documentation

## 2021-05-02 DIAGNOSIS — Z7984 Long term (current) use of oral hypoglycemic drugs: Secondary | ICD-10-CM | POA: Diagnosis not present

## 2021-05-02 DIAGNOSIS — Z794 Long term (current) use of insulin: Secondary | ICD-10-CM | POA: Diagnosis not present

## 2021-05-02 DIAGNOSIS — R0602 Shortness of breath: Secondary | ICD-10-CM | POA: Diagnosis not present

## 2021-05-02 DIAGNOSIS — K5900 Constipation, unspecified: Secondary | ICD-10-CM | POA: Insufficient documentation

## 2021-05-02 DIAGNOSIS — I2510 Atherosclerotic heart disease of native coronary artery without angina pectoris: Secondary | ICD-10-CM | POA: Insufficient documentation

## 2021-05-02 DIAGNOSIS — Z20822 Contact with and (suspected) exposure to covid-19: Secondary | ICD-10-CM | POA: Diagnosis not present

## 2021-05-02 DIAGNOSIS — E119 Type 2 diabetes mellitus without complications: Secondary | ICD-10-CM | POA: Insufficient documentation

## 2021-05-02 LAB — CBC WITH DIFFERENTIAL/PLATELET
Abs Immature Granulocytes: 0.07 10*3/uL (ref 0.00–0.07)
Basophils Absolute: 0.1 10*3/uL (ref 0.0–0.1)
Basophils Relative: 1 %
Eosinophils Absolute: 0.4 10*3/uL (ref 0.0–0.5)
Eosinophils Relative: 4 %
HCT: 31.3 % — ABNORMAL LOW (ref 36.0–46.0)
Hemoglobin: 9.4 g/dL — ABNORMAL LOW (ref 12.0–15.0)
Immature Granulocytes: 1 %
Lymphocytes Relative: 19 %
Lymphs Abs: 2.1 10*3/uL (ref 0.7–4.0)
MCH: 24.2 pg — ABNORMAL LOW (ref 26.0–34.0)
MCHC: 30 g/dL (ref 30.0–36.0)
MCV: 80.7 fL (ref 80.0–100.0)
Monocytes Absolute: 1.2 10*3/uL — ABNORMAL HIGH (ref 0.1–1.0)
Monocytes Relative: 11 %
Neutro Abs: 7.2 10*3/uL (ref 1.7–7.7)
Neutrophils Relative %: 64 %
Platelets: 490 10*3/uL — ABNORMAL HIGH (ref 150–400)
RBC: 3.88 MIL/uL (ref 3.87–5.11)
RDW: 16.2 % — ABNORMAL HIGH (ref 11.5–15.5)
WBC: 11 10*3/uL — ABNORMAL HIGH (ref 4.0–10.5)
nRBC: 0 % (ref 0.0–0.2)

## 2021-05-02 LAB — COMPREHENSIVE METABOLIC PANEL
ALT: 19 U/L (ref 0–44)
AST: 21 U/L (ref 15–41)
Albumin: 3.5 g/dL (ref 3.5–5.0)
Alkaline Phosphatase: 60 U/L (ref 38–126)
Anion gap: 9 (ref 5–15)
BUN: 11 mg/dL (ref 8–23)
CO2: 29 mmol/L (ref 22–32)
Calcium: 8 mg/dL — ABNORMAL LOW (ref 8.9–10.3)
Chloride: 96 mmol/L — ABNORMAL LOW (ref 98–111)
Creatinine, Ser: 0.94 mg/dL (ref 0.44–1.00)
GFR, Estimated: >60 mL/min (ref >60)
Glucose, Bld: 237 mg/dL — ABNORMAL HIGH (ref 70–99)
Potassium: 3.1 mmol/L — ABNORMAL LOW (ref 3.5–5.1)
Sodium: 134 mmol/L — ABNORMAL LOW (ref 135–145)
Total Bilirubin: 1.1 mg/dL (ref 0.3–1.2)
Total Protein: 6.7 g/dL (ref 6.5–8.1)

## 2021-05-02 LAB — URINALYSIS, ROUTINE W REFLEX MICROSCOPIC
Bilirubin Urine: NEGATIVE
Glucose, UA: 50 mg/dL — AB
Hgb urine dipstick: NEGATIVE
Ketones, ur: NEGATIVE mg/dL
Leukocytes,Ua: NEGATIVE
Nitrite: NEGATIVE
Protein, ur: NEGATIVE mg/dL
Specific Gravity, Urine: 1.01 (ref 1.005–1.030)
pH: 7 (ref 5.0–8.0)

## 2021-05-02 LAB — TROPONIN I (HIGH SENSITIVITY)
Troponin I (High Sensitivity): 2 ng/L (ref <18)
Troponin I (High Sensitivity): 2 ng/L (ref <18)

## 2021-05-02 LAB — CBG MONITORING, ED: Glucose-Capillary: 217 mg/dL — ABNORMAL HIGH (ref 70–99)

## 2021-05-02 LAB — RESP PANEL BY RT-PCR (FLU A&B, COVID) ARPGX2
Influenza A by PCR: NEGATIVE
Influenza B by PCR: NEGATIVE
SARS Coronavirus 2 by RT PCR: NEGATIVE

## 2021-05-02 LAB — D-DIMER, QUANTITATIVE: D-Dimer, Quant: 1.05 ug/mL-FEU — ABNORMAL HIGH (ref 0.00–0.50)

## 2021-05-02 LAB — TYPE AND SCREEN
ABO/RH(D): O NEG
Antibody Screen: NEGATIVE

## 2021-05-02 LAB — POC OCCULT BLOOD, ED: Fecal Occult Bld: NEGATIVE

## 2021-05-02 MED ORDER — SODIUM CHLORIDE 0.9 % IV SOLN: Freq: Once | INTRAVENOUS | Status: AC

## 2021-05-02 MED ORDER — TECHNETIUM TO 99M ALBUMIN AGGREGATED
4.0400 | Freq: Once | INTRAVENOUS | Status: AC | PRN
  Administered 2021-05-02: 4.04 via INTRAVENOUS

## 2021-05-02 MED ORDER — SODIUM CHLORIDE 0.9 % IV BOLUS
500.0000 mL | Freq: Once | INTRAVENOUS | Status: AC
Start: 2021-05-02 — End: 2021-05-02
  Administered 2021-05-02: 500 mL via INTRAVENOUS

## 2021-05-02 MED ORDER — SODIUM CHLORIDE 0.9 % IV BOLUS
500.0000 mL | Freq: Once | INTRAVENOUS | Status: AC
  Administered 2021-05-02: 500 mL via INTRAVENOUS

## 2021-05-02 MED ORDER — POTASSIUM CHLORIDE ER 10 MEQ PO TBCR: 10.0000 meq | EXTENDED_RELEASE_TABLET | Freq: Every day | ORAL | 0 refills | Status: DC

## 2021-05-02 NOTE — ED Notes (Signed)
PA at bedside. Pt reports hx of bleeding ulcer a few years back that required a transfusion. She denies n/v/d and no blood in stool or urine. No bleeding noted anywhere at this time. Pt is alert and oriented. Pt does admit to frequent aleve use, but not daily. She states she did not actually fall when she had syncopal episode and did not hit head. No LOC.

## 2021-05-02 NOTE — ED Notes (Signed)
Pt is a very difficult stick. PA updated that no one has been able to secure and IV greater than a 22g even with attempt using ultrasound. New orders for V/Q scan instead.

## 2021-05-02 NOTE — ED Notes (Signed)
BP L: 109/55  BP Sitting: 114/54  BP Standing: 102/67

## 2021-05-02 NOTE — ED Provider Notes (Signed)
[facility] EMERGENCY DEPARTMENT Provider Note   CSN: [id number] Arrival date & time: 05/02/21  1237     History Chief Complaint  Patient presents with   Weakness    Jeanne Kim is a 80 y.o. female with a history of type 2 diabetes, hypertension, hyperlipidemia, GI bleed, breast cancer.  Patient presents to the emergency department with a chief complaint of syncopal episode and generalized weakness.  Patient reports that syncopal episode occurred just prior to arrival in the emergency department.  Felt hot and had fatigue, was able to ambulate from her car to a chair and had a syncopal episode.  Syncopal episode lasted for a few minutes and was witnessed by family member.  Patient denies any preceding shortness of breath, chest pain, palpitations, lightheadedness, sudden onset of headache.  Patient reports that she has been having shortness of breath of the last few days.  Due to this patient started Lasix yesterday prescribed by her PCP.   Patient also endorses constipation for the last 5 to 6 days.  Prior to constipation patient denies any blood in stool, melena.  Patient denies any rectal bleeding at present.  Patient denies any fevers, chills, chest pain, palpitations, leg swelling, abdominal pain, nausea, vomiting, urinary symptoms, lightheadedness, dizziness, numbness, weakness, facial asymmetry, slurred speech.  No known sick contacts.  Patient endorses daily Aleve use.  Patient denies any tobacco use, alcohol use, or illicit drug use.   Weakness Associated symptoms: shortness of breath   Associated symptoms: no abdominal pain, no chest pain, no cough, no diarrhea, no dizziness, no dysuria, no fever, no frequency, no headaches, no nausea, no seizures and no vomiting       Past Medical History:  Diagnosis Date   Allergy    Anemia    Anginal pain ([place])    Anxiety    Arthritis    Blood transfusion without reported diagnosis    Breast cancer ([place])    Carotid artery  plaque    bilat   Cataract    Colon polyps    adenomatous   Concussion 02/2016   Driving restrictions for 6w   Coronary heart disease    Depression    Diverticulosis    Dizziness    Esophageal reflux    Esophageal stricture    Fibromyalgia    Gallstones    Hiatal hernia    Hypercholesterolemia    Hypertension    Hypothyroidism    IBS (irritable bowel syndrome)    Kidney stones    Osteoporosis    Pneumonia    recently, just finished ABO   Pneumonia    PONV (postoperative nausea and vomiting)    Sleep apnea    CPAP   Thyroid disease 2002   Type II or unspecified type diabetes mellitus without mention of complication, not stated as uncontrolled    Unspecified gastritis and gastroduodenitis without mention of hemorrhage     Patient Active Problem List   Diagnosis Date Noted   Symptomatic anemia 12/23/2020   GI bleeding 12/20/2020   Hypokalemia due to excessive gastrointestinal loss of potassium 10/31/2020   Hyperbilirubinemia 10/31/2020   Thrombocytosis 10/31/2020   Type 2 diabetes mellitus with hyperglycemia ([place]) 10/31/2020   Abdominal pain 10/04/2020   Fibromyalgia 10/04/2020   Hyperglycemia due to type 2 diabetes mellitus ([place]) 10/04/2020   Proctocolitis 10/04/2020   Gastric ulcer 10/04/2020   History of gastrointestinal bleeding 10/04/2020   Intracranial bleed ([place]) 09/28/2020   AKI (acute kidney injury) ([place]) 09/28/2020  Upper GI bleed 08/05/2020   Hypokalemia 03/25/2020   Chronic pain 03/22/2020   Hypomagnesemia 03/22/2020   Insomnia 03/22/2020   MDD (major depressive disorder) 03/22/2020   Status post total replacement of left hip 03/21/2020   S/P musculoskeletal system surgery 03/21/2020   At risk for falls 02/24/2020   Chronic, continuous use of opioids 02/24/2020   Senile osteoporosis 02/24/2020   History of radiation therapy 02/24/2020   Elevated white blood cell count, unspecified 02/06/2020   Microcytic anemia 02/05/2020   Electrolyte  abnormality 02/05/2020   Lower extremity edema 02/04/2020   Pathological fracture of left hip due to age-related osteoporosis ([place]) 02/04/2020   History of fracture of left hip 02/03/2020   Fecal incontinence    Swelling of limb-Bilateral leg 04/29/2014   Weakness of both legs 04/29/2014   Dizziness and giddiness 04/29/2014   Malignant neoplasm of breast ([place]) 07/02/2013   Occlusion and stenosis of carotid artery without mention of cerebral infarction 04/28/2013   Carotid stenosis 01/20/2013   Autonomic orthostatic hypotension 10/17/2012   Postural hypotension 10/17/2012   Dyspnea 08/27/2012   Syncope 02/17/2012   Head trauma 02/14/2012   Diaphoresis 02/05/2012   Unspecified constipation 12/12/2011   Personal history of colonic polyps 12/12/2011   Family history of malignant neoplasm of gastrointestinal tract 12/12/2011   Diverticulosis of colon 12/12/2011   Gastritis 12/12/2011   GERD with stricture 12/12/2011   Gastroesophageal reflux disease 11/27/2011   Constipation 11/27/2011   IBS (irritable bowel syndrome) 11/27/2011   Bacterial overgrowth syndrome 11/27/2011   DM (diabetes mellitus) ([place]) 11/27/2011   S/P cholecystectomy 11/27/2011   Adult hypothyroidism 07/13/2011   ADENOCARCINOMA, LEFT BREAST 12/09/2009   CHEST PAIN-PRECORDIAL 12/09/2009   History of breast cancer 12/09/2009   Hypothyroidism 04/27/2009   DIABETES MELLITUS 04/07/2009   HYPERCHOLESTEROLEMIA  IIA 04/07/2009   OVERWEIGHT/OBESITY 04/07/2009   Obstructive sleep apnea 04/07/2009   Benign essential HTN 04/07/2009   CAD (coronary artery disease) 04/07/2009   Irritable bowel syndrome 04/07/2009   DEGENERATIVE JOINT DISEASE 04/07/2009   DEGENERATIVE [place] DISEASE 04/07/2009   FIBROMYALGIA 04/07/2009   Type 2 diabetes mellitus ([place]) 05/04/2008   Thyroid nodule 05/04/2008    Past Surgical History:  Procedure Laterality Date   ANAL RECTAL MANOMETRY N/A 08/05/2015   Procedure: ANO RECTAL MANOMETRY;   Surgeon: Mauri Pole, MD;  Location: [facility];  Service: Endoscopy;  Laterality: N/A;   APPENDECTOMY     BIOPSY  08/06/2020   Procedure: BIOPSY;  Surgeon: Eloise Harman, DO;  Location: [facility];  Service: Endoscopy;;   BIOPSY  12/21/2020   Procedure: BIOPSY;  Surgeon: Thornton Park, MD;  Location: [facility];  Service: Gastroenterology;;   BIOPSY  12/23/2020   Procedure: BIOPSY;  Surgeon: Thornton Park, MD;  Location: [facility];  Service: Gastroenterology;;   BREAST LUMPECTOMY     left   CARDIAC CATHETERIZATION     CATARACT EXTRACTION W/PHACO Left 03/30/2013   Procedure: CATARACT EXTRACTION PHACO AND INTRAOCULAR LENS PLACEMENT ([place]);  Surgeon: Tonny Branch, MD;  Location: [facility];  Service: Ophthalmology;  Laterality: Left;  CDE:19.28   CATARACT EXTRACTION W/PHACO Right 04/09/2013   Procedure: CATARACT EXTRACTION PHACO AND INTRAOCULAR LENS PLACEMENT (I[facility]);  Surgeon: Tonny Branch, MD;  Location: [facility];  Service: Ophthalmology;  Laterality: Right;  CDE: 15.44   CERVICAL FUSION     CESAREAN SECTION     CHOLECYSTECTOMY     COLONOSCOPY WITH PROPOFOL N/A 12/23/2020   Procedure: COLONOSCOPY WITH PROPOFOL;  Surgeon: Tarri Glenn,  Joelene Millin, MD;  Location: [facility];  Service: Gastroenterology;  Laterality: N/A;   CYSTOCELE REPAIR     DILATION AND CURETTAGE OF UTERUS     ESOPHAGOGASTRODUODENOSCOPY (EGD) WITH PROPOFOL N/A 08/06/2020   Procedure: ESOPHAGOGASTRODUODENOSCOPY (EGD) WITH PROPOFOL;  Surgeon: Eloise Harman, DO;  [facility];  Service: Endoscopy;  Laterality: N/A;   ESOPHAGOGASTRODUODENOSCOPY (EGD) WITH PROPOFOL N/A 12/21/2020   Procedure: ESOPHAGOGASTRODUODENOSCOPY (EGD) WITH PROPOFOL;  Surgeon: Thornton Park, MD;  Location: [facility];  Service: Gastroenterology;  Laterality: N/A;   EYE SURGERY Left 03-30-13   Cataract   EYE SURGERY Right 04-09-13   Cataract   RECTOCELE REPAIR     THYROIDECTOMY     TONSILLECTOMY     TOTAL ABDOMINAL HYSTERECTOMY        OB History   No obstetric history on file.     Family History  Problem Relation Age of Onset   Sarcoidosis Mother    Diabetes Mother    Cancer Mother        sarcoma -  Right arm amputation   Hypertension Mother    Lung cancer Father    Diabetes Father    Heart disease Father        Before age 46   Urolithiasis Father    Cancer Father        Lung   Hyperlipidemia Father    Hypertension Father    Esophageal cancer Maternal Uncle    Ovarian cancer Maternal Aunt    Colon cancer Maternal Uncle    Lung cancer Maternal Uncle    Diabetes Maternal Uncle    Diabetes Maternal Aunt    Colon polyps Maternal Uncle    Heart disease Paternal Grandmother     Social History   Tobacco Use   Smoking status: Never   Smokeless tobacco: Never  Vaping Use   Vaping Use: Never used  Substance Use Topics   Alcohol use: No   Drug use: No    Home Medications Prior to Admission medications   Medication Sig Start Date End Date Taking? Authorizing Provider  acetaminophen (TYLENOL) 500 MG tablet Take 1 tablet (500 mg total) by mouth every 8 (eight) hours as needed for moderate pain or headache. 09/29/20 09/29/21  Thurnell Lose, MD  amLODipine (NORVASC) 5 MG tablet Take 5 mg by mouth daily. 09/08/20   [provider]  aspirin 81 MG chewable tablet Chew 1 tablet (81 mg total) by mouth daily. 10/13/20   Thurnell Lose, MD  atorvastatin (LIPITOR) 20 MG tablet Take 1 tablet (20 mg total) by mouth daily. 10/17/12   Hillary Bow, MD  CALCIUM-MAGNESIUM-ZINC PO Take 3 tablets by mouth daily.    [provider]  Cholecalciferol (VITAMIN D3) 50 MCG (2000 UT) capsule Take 2,000 Units by mouth daily. 09/27/20   [provider]  Coenzyme Q10 (CO Q-10) 100 MG CAPS Take 100 mg by mouth daily.    [provider]  cyanocobalamin (,VITAMIN B-12,) 1000 MCG/ML injection INJECT 1 ML INTO MUSCLE ONCE A MONTH Patient taking differently: Inject 1,000 mcg into the  muscle every 30 (thirty) days. 04/27/19   Mauri Pole, MD  doxepin (SINEQUAN) 10 MG capsule Take 10 mg by mouth at bedtime. 02/16/19   [provider]  DULoxetine (CYMBALTA) 60 MG capsule Take 60 mg by mouth daily.    [provider]  EPINEPHrine 0.3 mg/0.3 mL IJ SOAJ injection Inject 0.3 mg into the muscle daily as needed for anaphylaxis. 07/18/09  [provider]  ferrous sulfate 325 (65 FE) MG tablet Take 325 mg by mouth daily. 10/13/20   [provider]  fexofenadine (ALLEGRA) 30 MG tablet Take 30 mg by mouth daily.    [provider]  fluticasone (FLONASE) 50 MCG/ACT nasal spray Place 2 sprays into the nose daily.    [provider]  furosemide (LASIX) 40 MG tablet Take 1 tablet (40 mg total) by mouth 3 (three) times a week. Take on Monday/Wednesday/Friday 07/06/20   Richardson Dopp T, PA-C  gabapentin (NEURONTIN) 300 MG capsule Take 300 mg by mouth at bedtime.    [provider]  gentamicin (GARAMYCIN) 0.3 % ophthalmic solution Place 1 drop into both eyes 3 (three) times daily as needed (red or irritated eyes). 06/16/20   [provider]  hydrOXYzine (VISTARIL) 25 MG capsule Take 50 mg by mouth every 8 (eight) hours as needed for anxiety or itching. 11/04/20   [provider]  Insulin Glargine-Lixisenatide 100-33 UNT-MCG/ML SOPN Inject 20-25 Units into the skin daily. Pt adjust between 20-25 units    [provider]  linaclotide (LINZESS) 290 MCG CAPS capsule Take 1 capsule (290 mcg total) by mouth daily. MUST HAVE OFFICE VISIT FOR ADDITIONAL REFILLS 04/04/21   Mauri Pole, MD  magnesium oxide (MAG-OX) 400 MG tablet Take 400 mg by mouth daily.    [provider]  meclizine (ANTIVERT) 25 MG tablet Take 1 tablet (25 mg total) by mouth 3 (three) times daily as needed for dizziness. 09/29/20   Thurnell Lose, MD  Melatonin 10 MG TABS Take 10 mg by mouth at bedtime as needed (sleep).     [provider]  metFORMIN (GLUCOPHAGE) 1000 MG tablet Take 1,000 mg by mouth 2 (two) times daily with a meal.    [provider]  metolazone (ZAROXOLYN) 5 MG tablet Take 5 mg by mouth daily.    [provider]  metoprolol succinate (TOPROL-XL) 50 MG 24 hr tablet TAKE 1 TABLET BY MOUTH EVERY DAY Patient taking differently: Take 50 mg by mouth daily. 07/08/20   Sherren Mocha, MD  midodrine (PROAMATINE) 10 MG tablet Take 10 mg by mouth daily. 12/06/20   [provider]  montelukast (SINGULAIR) 10 MG tablet Take 10 mg by mouth at bedtime.    [provider]  Needles & Syringes MISC 25G 5/8 inch needle on 3 ml syringe-Use once monthly for B12 injections 07/20/15   Nandigam, Karleen Hampshire V, MD  nitroGLYCERIN (NITROSTAT) 0.4 MG SL tablet PLACE 1 TABLET (0.4 MG TOTAL) UNDER THE TONGUE EVERY 5 (FIVE) MINUTES AS NEEDED FOR CHEST PAIN. 12/03/19   Sherren Mocha, MD  NOVOLOG FLEXPEN 100 UNIT/ML FlexPen Inject 0-20 Units into the skin daily. At night , on sliding scale. 11/16/20   [provider]  nystatin (MYCOSTATIN) 100000 UNIT/ML suspension Use as directed 5 mLs in the mouth or throat daily as needed (mouthwash). 09/27/20   [provider]  Omega-3 Fatty Acids (FISH OIL) 1000 MG CAPS Take 1 capsule by mouth daily. 07/18/09   [provider]  ondansetron (ZOFRAN) 4 MG tablet Take 1 tablet (4 mg total) by mouth every 8 (eight) hours as needed for nausea or vomiting. 09/29/20   Thurnell Lose, MD  [facility] VERIO test strip 1 each by Other route as directed.  04/18/16   [provider]  pantoprazole (PROTONIX) 40 MG tablet Take 1 tablet (40 mg total) by mouth 2 (two) times daily. 08/07/20   Cordelia Poche  A, MD  potassium chloride (KLOR-CON) 10 MEQ tablet Take 1 tablet (10 mEq total) by mouth daily for 7 days. 12/24/20 12/31/20  Antonieta Pert, MD  ramipril (ALTACE) 5 MG capsule Take 1 capsule (5 mg total) by mouth at bedtime. 01/11/16   Sherren Mocha, MD  SYNTHROID 137 MCG tablet Take 137 mcg by mouth daily. 09/30/20   [provider]  traMADol (ULTRAM) 50 MG tablet Take 50 mg by mouth daily as needed for moderate pain. 10/25/20   [provider]    Allergies    Codeine, Sulfonamide derivatives, Canagliflozin, Penicillins, and Sulfa antibiotics  Review of Systems   Review of Systems  Constitutional:  Positive for fatigue. Negative for chills and fever.  HENT:  Negative for congestion, rhinorrhea and sore throat.   Eyes:  Negative for visual disturbance.  Respiratory:  Positive for shortness of breath. Negative for cough.   Cardiovascular:  Negative for chest pain, palpitations and leg swelling.  Gastrointestinal:  Positive for constipation. Negative for abdominal distention, abdominal pain, anal bleeding, blood in stool, diarrhea, nausea, rectal pain and vomiting.  Genitourinary:  Negative for difficulty urinating, dysuria, frequency, hematuria, vaginal bleeding, vaginal discharge and vaginal pain.  Musculoskeletal:  Negative for back pain and neck pain.  Skin:  Negative for color change, pallor, rash and wound.  Neurological:  Positive for syncope and weakness (Generalized). Negative for dizziness, tremors, seizures, facial asymmetry, speech difficulty, light-headedness, numbness and headaches.  Psychiatric/Behavioral:  Negative for confusion.    Physical Exam Updated Vital Signs BP 116/65 (BP Location: Left Arm)   Pulse 72   Temp 97.9 F (36.6 C) (Oral)   Resp 18   Ht '5\' 4"'$  (1.626 m)   Wt 76 kg   SpO2 98%   BMI 28.76 kg/m   Physical Exam Vitals and nursing note reviewed. Exam conducted with a chaperone present (Female nurse tech present as chaperone).  Constitutional:      General: She is not in acute distress.    Appearance: She is not ill-appearing, toxic-appearing or diaphoretic.  HENT:     Head: Normocephalic.  Eyes:     General: No scleral icterus.       Right eye: No discharge.         Left eye: No discharge.  Cardiovascular:     Rate and Rhythm: Normal rate.     Pulses:          Radial pulses are 2+ on the right side and 2+ on the left side.     Heart sounds: Normal heart sounds.  Pulmonary:     Effort: Pulmonary effort is normal. No tachypnea, bradypnea or respiratory distress.     Breath sounds: Normal breath sounds. No stridor.  Abdominal:     General: Bowel sounds are normal. There is no distension. There are no signs of injury.     Palpations: Abdomen is soft. There is no mass or pulsatile mass.     Tenderness: There is no abdominal tenderness. There is no guarding or rebound.     Hernia: There is no hernia in the umbilical area or ventral area.  Genitourinary:    Rectum: Guaiac result negative. No mass, tenderness, anal fissure, external hemorrhoid or internal hemorrhoid. Normal anal tone.     Comments: Light brown stool noted in rectal vault, no rectal bleeding, blood in stool, or melena noted. Musculoskeletal:     Cervical back: Normal range of motion and neck supple.  Skin:    General: Skin  is warm and dry.     Coloration: Skin is pale. Skin is not cyanotic.  Neurological:     General: No focal deficit present.     Mental Status: She is alert and oriented to person, place, and time.     GCS: GCS eye subscore is 4. GCS verbal subscore is 5. GCS motor subscore is 6.     Comments: Patient moves all limbs equally without difficulty.  Psychiatric:        Behavior: Behavior is cooperative.    ED Results / Procedures / Treatments   Labs (all labs ordered are listed, but only abnormal results are displayed) Labs Reviewed  COMPREHENSIVE METABOLIC PANEL - Abnormal; Notable for the following components:      Result Value   Sodium 134 (*)    Potassium 3.1 (*)    Chloride 96 (*)    Glucose, Bld 237 (*)    Calcium 8.0 (*)    All other components within normal limits  CBC WITH DIFFERENTIAL/PLATELET - Abnormal; Notable for the following components:   WBC 11.0  (*)    Hemoglobin 9.4 (*)    HCT 31.3 (*)    MCH 24.2 (*)    RDW 16.2 (*)    Platelets 490 (*)    Monocytes Absolute 1.2 (*)    All other components within normal limits  D-DIMER, QUANTITATIVE - Abnormal; Notable for the following components:   D-Dimer, Quant 1.05 (*)    All other components within normal limits  URINALYSIS, ROUTINE W REFLEX MICROSCOPIC - Abnormal; Notable for the following components:   Glucose, UA 50 (*)    All other components within normal limits  CBG MONITORING, ED - Abnormal; Notable for the following components:   Glucose-Capillary 217 (*)    All other components within normal limits  RESP PANEL BY RT-PCR (FLU A&B, COVID) ARPGX2  POC OCCULT BLOOD, ED  TYPE AND SCREEN  TROPONIN I (HIGH SENSITIVITY)  TROPONIN I (HIGH SENSITIVITY)    EKG None  Radiology NM Pulmonary Perfusion  Result Date: 05/02/2021 CLINICAL DATA:  Chest pain and elevated D-dimer. EXAM: NUCLEAR MEDICINE PERFUSION LUNG SCAN TECHNIQUE: Perfusion images were obtained in multiple projections after intravenous injection of radiopharmaceutical. Ventilation scans intentionally deferred if perfusion scan and chest x-ray adequate for interpretation during COVID 19 epidemic. RADIOPHARMACEUTICALS:  4.04 mCi Tc-50mMAA IV COMPARISON:  None. FINDINGS: Normal perfusion is seen bilaterally with homogeneous distribution of tracer throughout both lungs. No focal perfusion defects are identified. IMPRESSION: Normal nuclear medicine perfusion lung scan. Electronically Signed   By: TVirgina NorfolkM.D.   On: 05/02/2021 21:50   DG Chest Portable 1 View  Result Date: 05/02/2021 CLINICAL DATA:  Syncope during a blood draw today. EXAM: PORTABLE CHEST 1 VIEW COMPARISON:  Single-view of the chest 01/15/2021 FINDINGS: Lungs clear. Heart size normal. No pneumothorax or pleural fluid. No acute or focal bony abnormality. IMPRESSION: No acute disease. Electronically Signed   By: TInge RiseM.D.   On: 05/02/2021 14:27     Procedures Procedures   Medications Ordered in ED Medications  sodium chloride 0.9 % bolus 500 mL (0 mLs Intravenous Stopped 05/02/21 1733)  sodium chloride 0.9 % bolus 500 mL (0 mLs Intravenous Stopped 05/02/21 2241)  0.9 %  sodium chloride infusion ( Intravenous Stopped 05/02/21 2242)  technetium albumin aggregated (MAA) injection solution [id number] (4123XX123millicuries Intravenous Contrast Given 05/02/21 2052)    ED Course  I have reviewed the triage vital signs and the nursing  notes.  Pertinent labs & imaging results that were available during my care of the patient were reviewed by me and considered in my medical decision making (see chart for details).    MDM Rules/Calculators/A&P                           Alert 80 year old female no acute distress, nontoxic-appearing.  Patient is alert and oriented to person, place, and time.  Patient is noted to appear pale.  Patient complains of syncopal episode and generalized fatigue.  Per chart review patient was noted to have orthostatic hypotension with EMS prior to arrival.  Patient noted to be hypotensive with blood pressure of 98/58.  Will obtain orthostatic vital signs.  Concern for possible GI bleed will obtain Hemoccult as well as CBC, and type and screen.  Due to patient's shortness of breath concern for possible PE causing syncope will obtain D-dimer, EKG, and troponin.    D-dimer noted to be elevated at 1.05, will order CTA to evaluate for possible PE. Patient noted to have leukocytosis at 11.0 and anemia with hemoglobin at 9.4 and hematocrit at 31.3; previous labs to compare to are from 4 months prior when patient was admitted to emergency department for symptomatic anemia after receiving 1 unit PRBC while in emergency department.   CMP shows hypokalemia with potassium of 3.1, glucose elevated at 237.  No signs of DKA as anion gap and bicarb within normal limits. Chest x-ray shows no active cardiopulmonary disease Troponin 2  and 2  Fecal occult negative.  Due to difficult IV access patient was unable to receive CTA to evaluate for pulmonary embolism.  VQ scan was ordered.  VQ scan showed normal perfusion bilaterally with homogeneous distribution of tracer throughout both lungs.  No focal perfusion deficit noted.  Patient reports improvement in fatigue after receiving fluid bolus.  Patient remains hemodynamically stable throughout emergency department stay.  Will discharge patient at this time with close follow-up to PCP.  Discussed results, findings, treatment and follow up. Patient advised of return precautions. Patient verbalized understanding and agreed with plan.   Final Clinical Impression(s) / ED Diagnoses Final diagnoses:  Syncope, unspecified syncope type    Rx / DC Orders ED Discharge Orders     None        Loni Beckwith, PA-C 05/03/21 0341    Elnora Morrison, MD 05/04/21 [phone]

## 2021-05-02 NOTE — ED Notes (Signed)
Patient is consuming ice, tolerating it well. Waiting for test results and dispo plan.

## 2021-05-02 NOTE — ED Notes (Signed)
Patient transported to CT 

## 2021-05-02 NOTE — Discharge Instructions (Addendum)
You came to the emergency department today to be evaluated for your syncopal episode.  Your lab work was reassuring.  The scan of your chest showed no signs of a blood clot.  Your COVID-19 and influenza testing was negative.  Will need to follow-up with your primary care provider in the next few days to be reassessed.  I have given you prescription for potassium please take this as prescribed.  Get help right away if you: Have a severe headache. Faint once or repeatedly. Have pain in your chest, abdomen, or back. Have a very fast or irregular heartbeat (palpitations). Have pain when you breathe. Are bleeding from your mouth or rectum, or you have black or tarry stool. Have a seizure. Are confused. Have trouble walking. Have severe weakness. Have vision problems.

## 2021-05-02 NOTE — ED Triage Notes (Signed)
Syncopal episode had blood drawn today, outside and "passed out", pt arrived alert oriented, skin dry and pale.  91/59 sitting, standing 85/53, CBG 341, A/O X 4

## 2021-05-04 ENCOUNTER — Observation Stay (HOSPITAL_COMMUNITY)
Admission: EM | Admit: 2021-05-04 | Discharge: 2021-05-05 | Disposition: A | Discharge status: Home / Self Care | Payer: Medicare HMO | Attending: Internal Medicine | Admitting: Internal Medicine

## 2021-05-04 ENCOUNTER — Other Ambulatory Visit: Payer: Self-pay

## 2021-05-04 ENCOUNTER — Encounter (HOSPITAL_COMMUNITY): Payer: Self-pay | Admitting: Emergency Medicine

## 2021-05-04 DIAGNOSIS — I10 Essential (primary) hypertension: Secondary | ICD-10-CM | POA: Diagnosis not present

## 2021-05-04 DIAGNOSIS — R2689 Other abnormalities of gait and mobility: Secondary | ICD-10-CM | POA: Diagnosis not present

## 2021-05-04 DIAGNOSIS — E8809 Other disorders of plasma-protein metabolism, not elsewhere classified: Secondary | ICD-10-CM

## 2021-05-04 DIAGNOSIS — Z853 Personal history of malignant neoplasm of breast: Secondary | ICD-10-CM | POA: Diagnosis not present

## 2021-05-04 DIAGNOSIS — Z794 Long term (current) use of insulin: Secondary | ICD-10-CM | POA: Diagnosis not present

## 2021-05-04 DIAGNOSIS — E039 Hypothyroidism, unspecified: Secondary | ICD-10-CM | POA: Diagnosis not present

## 2021-05-04 DIAGNOSIS — Z7984 Long term (current) use of oral hypoglycemic drugs: Secondary | ICD-10-CM | POA: Diagnosis not present

## 2021-05-04 DIAGNOSIS — I2510 Atherosclerotic heart disease of native coronary artery without angina pectoris: Secondary | ICD-10-CM | POA: Insufficient documentation

## 2021-05-04 DIAGNOSIS — E876 Hypokalemia: Secondary | ICD-10-CM | POA: Diagnosis present

## 2021-05-04 DIAGNOSIS — E785 Hyperlipidemia, unspecified: Secondary | ICD-10-CM

## 2021-05-04 DIAGNOSIS — K219 Gastro-esophageal reflux disease without esophagitis: Secondary | ICD-10-CM | POA: Diagnosis not present

## 2021-05-04 DIAGNOSIS — E1165 Type 2 diabetes mellitus with hyperglycemia: Secondary | ICD-10-CM | POA: Diagnosis not present

## 2021-05-04 DIAGNOSIS — Z79899 Other long term (current) drug therapy: Secondary | ICD-10-CM | POA: Insufficient documentation

## 2021-05-04 DIAGNOSIS — R531 Weakness: Secondary | ICD-10-CM

## 2021-05-04 DIAGNOSIS — Z7982 Long term (current) use of aspirin: Secondary | ICD-10-CM | POA: Insufficient documentation

## 2021-05-04 DIAGNOSIS — R9431 Abnormal electrocardiogram [ECG] [EKG]: Secondary | ICD-10-CM

## 2021-05-04 DIAGNOSIS — E46 Unspecified protein-calorie malnutrition: Secondary | ICD-10-CM

## 2021-05-04 DIAGNOSIS — E782 Mixed hyperlipidemia: Secondary | ICD-10-CM

## 2021-05-04 LAB — CBC WITH DIFFERENTIAL/PLATELET
Abs Immature Granulocytes: 0.04 10*3/uL (ref 0.00–0.07)
Basophils Absolute: 0.1 10*3/uL (ref 0.0–0.1)
Basophils Relative: 1 %
Eosinophils Absolute: 0.4 10*3/uL (ref 0.0–0.5)
Eosinophils Relative: 4 %
HCT: 28.4 % — ABNORMAL LOW (ref 36.0–46.0)
Hemoglobin: 8.4 g/dL — ABNORMAL LOW (ref 12.0–15.0)
Immature Granulocytes: 0 %
Lymphocytes Relative: 24 %
Lymphs Abs: 2.3 10*3/uL (ref 0.7–4.0)
MCH: 23.7 pg — ABNORMAL LOW (ref 26.0–34.0)
MCHC: 29.6 g/dL — ABNORMAL LOW (ref 30.0–36.0)
MCV: 80.2 fL (ref 80.0–100.0)
Monocytes Absolute: 1.4 10*3/uL — ABNORMAL HIGH (ref 0.1–1.0)
Monocytes Relative: 14 %
Neutro Abs: 5.6 10*3/uL (ref 1.7–7.7)
Neutrophils Relative %: 57 %
Platelets: 446 10*3/uL — ABNORMAL HIGH (ref 150–400)
RBC: 3.54 MIL/uL — ABNORMAL LOW (ref 3.87–5.11)
RDW: 16 % — ABNORMAL HIGH (ref 11.5–15.5)
WBC: 9.9 10*3/uL (ref 4.0–10.5)
nRBC: 0 % (ref 0.0–0.2)

## 2021-05-04 LAB — CBG MONITORING, ED
Glucose-Capillary: 220 mg/dL — ABNORMAL HIGH (ref 70–99)
Glucose-Capillary: 368 mg/dL — ABNORMAL HIGH (ref 70–99)

## 2021-05-04 LAB — T4, FREE: Free T4: <0.25 ng/dL — ABNORMAL LOW (ref 0.61–1.12)

## 2021-05-04 LAB — COMPREHENSIVE METABOLIC PANEL
ALT: 17 U/L (ref 0–44)
AST: 22 U/L (ref 15–41)
Albumin: 3.4 g/dL — ABNORMAL LOW (ref 3.5–5.0)
Alkaline Phosphatase: 58 U/L (ref 38–126)
Anion gap: 9 (ref 5–15)
BUN: 11 mg/dL (ref 8–23)
CO2: 27 mmol/L (ref 22–32)
Calcium: 7.9 mg/dL — ABNORMAL LOW (ref 8.9–10.3)
Chloride: 99 mmol/L (ref 98–111)
Creatinine, Ser: 0.79 mg/dL (ref 0.44–1.00)
GFR, Estimated: >60 mL/min (ref >60)
Glucose, Bld: 200 mg/dL — ABNORMAL HIGH (ref 70–99)
Potassium: 2.9 mmol/L — ABNORMAL LOW (ref 3.5–5.1)
Sodium: 135 mmol/L (ref 135–145)
Total Bilirubin: 1 mg/dL (ref 0.3–1.2)
Total Protein: 6.6 g/dL (ref 6.5–8.1)

## 2021-05-04 LAB — TSH: TSH: 164.44 u[IU]/mL — ABNORMAL HIGH (ref 0.350–4.500)

## 2021-05-04 LAB — GLUCOSE, CAPILLARY
Glucose-Capillary: 341 mg/dL — ABNORMAL HIGH (ref 70–99)
Glucose-Capillary: 198 mg/dL — ABNORMAL HIGH (ref 70–99)

## 2021-05-04 LAB — PHOSPHORUS: Phosphorus: 3.3 mg/dL (ref 2.5–4.6)

## 2021-05-04 LAB — CORTISOL: Cortisol, Plasma: 44.6 ug/dL

## 2021-05-04 LAB — MAGNESIUM: Magnesium: 1.2 mg/dL — ABNORMAL LOW (ref 1.7–2.4)

## 2021-05-04 MED ORDER — POTASSIUM CHLORIDE 10 MEQ/100ML IV SOLN
10.0000 meq | INTRAVENOUS | Status: AC
  Administered 2021-05-04 (×2): 10 meq via INTRAVENOUS
  Filled 2021-05-04 (×2): qty 100

## 2021-05-04 MED ORDER — INSULIN ASPART 100 UNIT/ML IJ SOLN
0.0000 [IU] | Freq: Three times a day (TID) | INTRAMUSCULAR | Status: DC
  Administered 2021-05-04: 15 [IU] via SUBCUTANEOUS
  Administered 2021-05-04: 11 [IU] via SUBCUTANEOUS
  Administered 2021-05-04: 3 [IU] via SUBCUTANEOUS
  Administered 2021-05-05: 5 [IU] via SUBCUTANEOUS
  Administered 2021-05-05: 11 [IU] via SUBCUTANEOUS
  Filled 2021-05-04: qty 1

## 2021-05-04 MED ORDER — MELATONIN 3 MG PO TABS
6.0000 mg | ORAL_TABLET | Freq: Every day | ORAL | Status: DC
  Administered 2021-05-04: 6 mg via ORAL
  Filled 2021-05-04: qty 2

## 2021-05-04 MED ORDER — POTASSIUM CHLORIDE CRYS ER 20 MEQ PO TBCR
40.0000 meq | EXTENDED_RELEASE_TABLET | Freq: Once | ORAL | Status: AC
  Administered 2021-05-04: 40 meq via ORAL
  Filled 2021-05-04: qty 2

## 2021-05-04 MED ORDER — LEVOTHYROXINE SODIUM 75 MCG PO TABS
150.0000 ug | ORAL_TABLET | Freq: Every day | ORAL | Status: DC
  Administered 2021-05-04 – 2021-05-05 (×2): 150 ug via ORAL
  Filled 2021-05-04: qty 3
  Filled 2021-05-04: qty 2

## 2021-05-04 MED ORDER — AMLODIPINE BESYLATE 5 MG PO TABS
5.0000 mg | ORAL_TABLET | Freq: Every day | ORAL | Status: DC
  Administered 2021-05-04 – 2021-05-05 (×2): 5 mg via ORAL
  Filled 2021-05-04 (×2): qty 1

## 2021-05-04 MED ORDER — PANTOPRAZOLE SODIUM 40 MG PO TBEC
40.0000 mg | DELAYED_RELEASE_TABLET | Freq: Two times a day (BID) | ORAL | Status: DC
  Administered 2021-05-04 – 2021-05-05 (×3): 40 mg via ORAL
  Filled 2021-05-04 (×3): qty 1

## 2021-05-04 MED ORDER — HYDROCORTISONE NA SUCCINATE PF 100 MG IJ SOLR
100.0000 mg | Freq: Once | INTRAMUSCULAR | Status: AC
  Administered 2021-05-04: 100 mg via INTRAVENOUS
  Filled 2021-05-04: qty 2

## 2021-05-04 MED ORDER — ENOXAPARIN SODIUM 40 MG/0.4ML IJ SOSY
40.0000 mg | PREFILLED_SYRINGE | Freq: Every day | INTRAMUSCULAR | Status: DC
  Administered 2021-05-04 – 2021-05-05 (×2): 40 mg via SUBCUTANEOUS
  Filled 2021-05-04 (×2): qty 0.4

## 2021-05-04 MED ORDER — INSULIN DETEMIR 100 UNIT/ML ~~LOC~~ SOLN
10.0000 [IU] | Freq: Every day | SUBCUTANEOUS | Status: DC
  Administered 2021-05-04: 10 [IU] via SUBCUTANEOUS
  Filled 2021-05-04 (×4): qty 0.1

## 2021-05-04 MED ORDER — GLUCERNA SHAKE PO LIQD
237.0000 mL | Freq: Three times a day (TID) | ORAL | Status: DC
  Administered 2021-05-04 (×3): 237 mL via ORAL
  Filled 2021-05-04 (×5): qty 237

## 2021-05-04 MED ORDER — ATORVASTATIN CALCIUM 20 MG PO TABS
20.0000 mg | ORAL_TABLET | Freq: Every evening | ORAL | Status: DC
  Administered 2021-05-04: 20 mg via ORAL
  Filled 2021-05-04 (×2): qty 1

## 2021-05-04 MED ORDER — MAGNESIUM SULFATE 2 GM/50ML IV SOLN
2.0000 g | Freq: Once | INTRAVENOUS | Status: AC
  Administered 2021-05-04: 2 g via INTRAVENOUS
  Filled 2021-05-04: qty 50

## 2021-05-04 MED ORDER — ASPIRIN 81 MG PO CHEW
81.0000 mg | CHEWABLE_TABLET | Freq: Every day | ORAL | Status: DC
  Administered 2021-05-04 – 2021-05-05 (×2): 81 mg via ORAL
  Filled 2021-05-04 (×2): qty 1

## 2021-05-04 MED ORDER — INSULIN ASPART 100 UNIT/ML IJ SOLN: 0.0000 [IU] | Freq: Every day | INTRAMUSCULAR | Status: DC

## 2021-05-04 NOTE — ED Triage Notes (Signed)
Pt arrives with husband with c/o chills. Pt advised by PCP yesterday to come to ED due to TSH being elevated and needing to be admitted.

## 2021-05-04 NOTE — ED Notes (Signed)
EKG completed

## 2021-05-04 NOTE — ED Provider Notes (Signed)
[facility] EMERGENCY DEPARTMENT Provider Note   CSN: [id number] Arrival date & time: 05/04/21  0129     History Chief Complaint  Patient presents with   Abnormal Lab    Jeanne Kim is a 80 y.o. female.  Patient is a 80 year old female with past medical history of diabetes, irritable bowel, hypertension, hypothyroidism.  Patient presenting today with complaints of weakness and chills.  She reports feeling "cold and cannot get warm".  She denies to me she is experiencing any specific aches or pains.  She denies any fevers.  Patient was seen 2 days ago with complaints of weakness here in the ER.  She underwent extensive work-up including laboratory studies and VQ scan showing no evidence for PE or obvious cause.  She apparently had blood work done by her primary doctor and her TSH was said to be elevated.  Patient is on Synthroid and medical record refers to a prior thyroidectomy.  The history is provided by the patient.      Past Medical History:  Diagnosis Date   Allergy    Anemia    Anginal pain ([place])    Anxiety    Arthritis    Blood transfusion without reported diagnosis    Breast cancer ([place])    Carotid artery plaque    bilat   Cataract    Colon polyps    adenomatous   Concussion 02/2016   Driving restrictions for 6w   Coronary heart disease    Depression    Diverticulosis    Dizziness    Esophageal reflux    Esophageal stricture    Fibromyalgia    Gallstones    Hiatal hernia    Hypercholesterolemia    Hypertension    Hypothyroidism    IBS (irritable bowel syndrome)    Kidney stones    Osteoporosis    Pneumonia    recently, just finished ABO   Pneumonia    PONV (postoperative nausea and vomiting)    Sleep apnea    CPAP   Thyroid disease 2002   Type II or unspecified type diabetes mellitus without mention of complication, not stated as uncontrolled    Unspecified gastritis and gastroduodenitis without mention of hemorrhage     Patient Active  Problem List   Diagnosis Date Noted   Symptomatic anemia 12/23/2020   GI bleeding 12/20/2020   Hypokalemia due to excessive gastrointestinal loss of potassium 10/31/2020   Hyperbilirubinemia 10/31/2020   Thrombocytosis 10/31/2020   Type 2 diabetes mellitus with hyperglycemia ([place]) 10/31/2020   Abdominal pain 10/04/2020   Fibromyalgia 10/04/2020   Hyperglycemia due to type 2 diabetes mellitus ([place]) 10/04/2020   Proctocolitis 10/04/2020   Gastric ulcer 10/04/2020   History of gastrointestinal bleeding 10/04/2020   Intracranial bleed ([place]) 09/28/2020   AKI (acute kidney injury) ([place]) 09/28/2020   Upper GI bleed 08/05/2020   Hypokalemia 03/25/2020   Chronic pain 03/22/2020   Hypomagnesemia 03/22/2020   Insomnia 03/22/2020   MDD (major depressive disorder) 03/22/2020   Status post total replacement of left hip 03/21/2020   S/P musculoskeletal system surgery 03/21/2020   At risk for falls 02/24/2020   Chronic, continuous use of opioids 02/24/2020   Senile osteoporosis 02/24/2020   History of radiation therapy 02/24/2020   Elevated white blood cell count, unspecified 02/06/2020   Microcytic anemia 02/05/2020   Electrolyte abnormality 02/05/2020   Lower extremity edema 02/04/2020   Pathological fracture of left hip due to age-related osteoporosis ([place]) 02/04/2020   History of fracture  of left hip 02/03/2020   Fecal incontinence    Swelling of limb-Bilateral leg 04/29/2014   Weakness of both legs 04/29/2014   Dizziness and giddiness 04/29/2014   Malignant neoplasm of breast ([place]) 07/02/2013   Occlusion and stenosis of carotid artery without mention of cerebral infarction 04/28/2013   Carotid stenosis 01/20/2013   Autonomic orthostatic hypotension 10/17/2012   Postural hypotension 10/17/2012   Dyspnea 08/27/2012   Syncope 02/17/2012   Head trauma 02/14/2012   Diaphoresis 02/05/2012   Unspecified constipation 12/12/2011   Personal history of colonic polyps 12/12/2011   Family  history of malignant neoplasm of gastrointestinal tract 12/12/2011   Diverticulosis of colon 12/12/2011   Gastritis 12/12/2011   GERD with stricture 12/12/2011   Gastroesophageal reflux disease 11/27/2011   Constipation 11/27/2011   IBS (irritable bowel syndrome) 11/27/2011   Bacterial overgrowth syndrome 11/27/2011   DM (diabetes mellitus) ([place]) 11/27/2011   S/P cholecystectomy 11/27/2011   Adult hypothyroidism 07/13/2011   ADENOCARCINOMA, LEFT BREAST 12/09/2009   CHEST PAIN-PRECORDIAL 12/09/2009   History of breast cancer 12/09/2009   Hypothyroidism 04/27/2009   DIABETES MELLITUS 04/07/2009   HYPERCHOLESTEROLEMIA  IIA 04/07/2009   OVERWEIGHT/OBESITY 04/07/2009   Obstructive sleep apnea 04/07/2009   Benign essential HTN 04/07/2009   CAD (coronary artery disease) 04/07/2009   Irritable bowel syndrome 04/07/2009   DEGENERATIVE JOINT DISEASE 04/07/2009   DEGENERATIVE [place] DISEASE 04/07/2009   FIBROMYALGIA 04/07/2009   Type 2 diabetes mellitus ([place]) 05/04/2008   Thyroid nodule 05/04/2008    Past Surgical History:  Procedure Laterality Date   ANAL RECTAL MANOMETRY N/A 08/05/2015   Procedure: ANO RECTAL MANOMETRY;  Surgeon: Mauri Pole, MD;  Location: [facility];  Service: Endoscopy;  Laterality: N/A;   APPENDECTOMY     BIOPSY  08/06/2020   Procedure: BIOPSY;  Surgeon: Eloise Harman, DO;  Location: [facility];  Service: Endoscopy;;   BIOPSY  12/21/2020   Procedure: BIOPSY;  Surgeon: Thornton Park, MD;  Location: [facility];  Service: Gastroenterology;;   BIOPSY  12/23/2020   Procedure: BIOPSY;  Surgeon: Thornton Park, MD;  Location: [facility];  Service: Gastroenterology;;   BREAST LUMPECTOMY     left   CARDIAC CATHETERIZATION     CATARACT EXTRACTION W/PHACO Left 03/30/2013   Procedure: CATARACT EXTRACTION PHACO AND INTRAOCULAR LENS PLACEMENT ([place]);  Surgeon: Tonny Branch, MD;  Location: [facility];  Service: Ophthalmology;  Laterality: Left;  CDE:19.28    CATARACT EXTRACTION W/PHACO Right 04/09/2013   Procedure: CATARACT EXTRACTION PHACO AND INTRAOCULAR LENS PLACEMENT ([facility]);  Surgeon: Tonny Branch, MD;  Location: [facility];  Service: Ophthalmology;  Laterality: Right;  CDE: 15.44   CERVICAL FUSION     CESAREAN SECTION     CHOLECYSTECTOMY     COLONOSCOPY WITH PROPOFOL N/A 12/23/2020   Procedure: COLONOSCOPY WITH PROPOFOL;  Surgeon: Thornton Park, MD;  Location: [facility];  Service: Gastroenterology;  Laterality: N/A;   CYSTOCELE REPAIR     DILATION AND CURETTAGE OF UTERUS     ESOPHAGOGASTRODUODENOSCOPY (EGD) WITH PROPOFOL N/A 08/06/2020   Procedure: ESOPHAGOGASTRODUODENOSCOPY (EGD) WITH PROPOFOL;  Surgeon: Eloise Harman, DO;  [facility];  Service: Endoscopy;  Laterality: N/A;   ESOPHAGOGASTRODUODENOSCOPY (EGD) WITH PROPOFOL N/A 12/21/2020   Procedure: ESOPHAGOGASTRODUODENOSCOPY (EGD) WITH PROPOFOL;  Surgeon: Thornton Park, MD;  [facility]: [facility];  Service: Gastroenterology;  Laterality: N/A;   EYE SURGERY Left 03-30-13   Cataract   EYE SURGERY Right 04-09-13   Cataract   RECTOCELE REPAIR  THYROIDECTOMY     TONSILLECTOMY     TOTAL ABDOMINAL HYSTERECTOMY       OB History   No obstetric history on file.     Family History  Problem Relation Age of Onset   Sarcoidosis Mother    Diabetes Mother    Cancer Mother        sarcoma -  Right arm amputation   Hypertension Mother    Lung cancer Father    Diabetes Father    Heart disease Father        Before age 32   Urolithiasis Father    Cancer Father        Lung   Hyperlipidemia Father    Hypertension Father    Esophageal cancer Maternal Uncle    Ovarian cancer Maternal Aunt    Colon cancer Maternal Uncle    Lung cancer Maternal Uncle    Diabetes Maternal Uncle    Diabetes Maternal Aunt    Colon polyps Maternal Uncle    Heart disease Paternal Grandmother     Social History   Tobacco Use   Smoking status: Never   Smokeless tobacco: Never  Vaping Use    Vaping Use: Never used  Substance Use Topics   Alcohol use: No   Drug use: No    Home Medications Prior to Admission medications   Medication Sig Start Date End Date Taking? Authorizing Provider  acetaminophen (TYLENOL) 500 MG tablet Take 1 tablet (500 mg total) by mouth every 8 (eight) hours as needed for moderate pain or headache. 09/29/20 09/29/21  Thurnell Lose, MD  amLODipine (NORVASC) 5 MG tablet Take 5 mg by mouth daily. 09/08/20   [provider]  aspirin 81 MG chewable tablet Chew 1 tablet (81 mg total) by mouth daily. 10/13/20   Thurnell Lose, MD  atorvastatin (LIPITOR) 20 MG tablet Take 1 tablet (20 mg total) by mouth daily. 10/17/12   Hillary Bow, MD  CALCIUM-MAGNESIUM-ZINC PO Take 3 tablets by mouth daily.    [provider]  Cholecalciferol (VITAMIN D3) 50 MCG (2000 UT) capsule Take 2,000 Units by mouth daily. 09/27/20   [provider]  ciprofloxacin (CILOXAN) 0.3 % ophthalmic solution 1 drop 2 (two) times daily. 04/24/21   [provider]  Coenzyme Q10 (CO Q-10) 100 MG CAPS Take 100 mg by mouth daily.    [provider]  cyanocobalamin (,VITAMIN B-12,) 1000 MCG/ML injection INJECT 1 ML INTO MUSCLE ONCE A MONTH Patient taking differently: Inject 1,000 mcg into the muscle every 30 (thirty) days. 04/27/19   Mauri Pole, MD  doxepin (SINEQUAN) 10 MG capsule Take 10 mg by mouth at bedtime. 02/16/19   [provider]  DULoxetine (CYMBALTA) 60 MG capsule Take 60 mg by mouth daily.    [provider]  EPINEPHrine 0.3 mg/0.3 mL IJ SOAJ injection Inject 0.3 mg into the muscle daily as needed for anaphylaxis. 07/18/09   [provider]  ferrous sulfate 325 (65 FE) MG tablet Take 325 mg by mouth daily. 10/13/20   [provider]  fexofenadine (ALLEGRA) 30 MG tablet Take 30 mg by mouth daily. Patient not taking: Reported on 05/02/2021    [provider]  fluticasone (FLONASE) 50  MCG/ACT nasal spray Place 2 sprays into the nose daily.    [provider]  furosemide (LASIX) 20 MG tablet Take 20 mg by mouth daily. 05/01/21   [provider]  furosemide (LASIX) 40 MG tablet Take 1 tablet (  40 mg total) by mouth 3 (three) times a week. Take on Monday/Wednesday/Friday Patient not taking: Reported on 05/02/2021 07/06/20   Richardson Dopp T, PA-C  gabapentin (NEURONTIN) 300 MG capsule Take 300 mg by mouth at bedtime.    [provider]  gentamicin (GARAMYCIN) 0.3 % ophthalmic solution Place 1 drop into both eyes 3 (three) times daily as needed (red or irritated eyes). 06/16/20   [provider]  hydrOXYzine (VISTARIL) 25 MG capsule Take 50 mg by mouth every 8 (eight) hours as needed for anxiety or itching. Patient not taking: Reported on 05/02/2021 11/04/20   [provider]  Insulin Glargine-Lixisenatide 100-33 UNT-MCG/ML SOPN Inject 20-25 Units into the skin daily. Pt adjust between 20-25 units    [provider]  linaclotide (LINZESS) 290 MCG CAPS capsule Take 1 capsule (290 mcg total) by mouth daily. MUST HAVE OFFICE VISIT FOR ADDITIONAL REFILLS 04/04/21   Mauri Pole, MD  magnesium oxide (MAG-OX) 400 MG tablet Take 400 mg by mouth daily.    [provider]  meclizine (ANTIVERT) 25 MG tablet Take 1 tablet (25 mg total) by mouth 3 (three) times daily as needed for dizziness. 09/29/20   Thurnell Lose, MD  Melatonin 10 MG TABS Take 10 mg by mouth at bedtime as needed (sleep).    [provider]  metFORMIN (GLUCOPHAGE) 1000 MG tablet Take 1,000 mg by mouth 2 (two) times daily with a meal.    [provider]  metolazone (ZAROXOLYN) 5 MG tablet Take 5 mg by mouth daily. Patient not taking: Reported on 05/02/2021    [provider]  metoprolol succinate (TOPROL-XL) 50 MG 24 hr tablet TAKE 1 TABLET BY MOUTH EVERY DAY Patient taking differently: Take 50 mg by mouth daily. 07/08/20   Sherren Mocha,  MD  midodrine (PROAMATINE) 10 MG tablet Take 10 mg by mouth daily. 12/06/20   [provider]  montelukast (SINGULAIR) 10 MG tablet Take 10 mg by mouth at bedtime.    [provider]  Needles & Syringes MISC 25G 5/8 inch needle on 3 ml syringe-Use once monthly for B12 injections 07/20/15   Nandigam, Karleen Hampshire V, MD  nitroGLYCERIN (NITROSTAT) 0.4 MG SL tablet PLACE 1 TABLET (0.4 MG TOTAL) UNDER THE TONGUE EVERY 5 (FIVE) MINUTES AS NEEDED FOR CHEST PAIN. 12/03/19   Sherren Mocha, MD  NOVOLOG FLEXPEN 100 UNIT/ML FlexPen Inject 0-20 Units into the skin daily. At night , on sliding scale. 11/16/20   [provider]  nystatin (MYCOSTATIN) 100000 UNIT/ML suspension Use as directed 5 mLs in the mouth or throat daily as needed (mouthwash). Patient not taking: Reported on 05/02/2021 09/27/20   [provider]  Omega-3 Fatty Acids (FISH OIL) 1000 MG CAPS Take 1 capsule by mouth daily. Patient not taking: Reported on 05/02/2021 07/18/09   [provider]  ondansetron (ZOFRAN) 4 MG tablet Take 1 tablet (4 mg total) by mouth every 8 (eight) hours as needed for nausea or vomiting. 09/29/20   Thurnell Lose, MD  [facility] VERIO test strip 1 each by Other route as directed.  04/18/16   [provider]  pantoprazole (PROTONIX) 40 MG tablet Take 1 tablet (40 mg total) by mouth 2 (two) times daily. 08/07/20   Mariel Aloe, MD  potassium chloride (KLOR-CON) 10 MEQ tablet Take 1 tablet (10 mEq total) by mouth daily for 5 days. 05/02/21 05/07/21  Loni Beckwith, PA-C  ramipril (ALTACE) 5 MG capsule Take 1 capsule (5 mg total)  by mouth at bedtime. 01/11/16   Sherren Mocha, MD  SYNTHROID 137 MCG tablet Take 137 mcg by mouth daily. 09/30/20   [provider]  traMADol (ULTRAM) 50 MG tablet Take 50 mg by mouth daily as needed for moderate pain. 10/25/20   [provider]    Allergies    Codeine, Sulfonamide derivatives, Canagliflozin, Penicillins, and  Sulfa antibiotics  Review of Systems   Review of Systems  All other systems reviewed and are negative.  Physical Exam Updated Vital Signs Ht '5\' 4"'$  (1.626 m)   Wt 76 kg   BMI 28.76 kg/m   Physical Exam Vitals and nursing note reviewed.  Constitutional:      General: She is not in acute distress.    Appearance: She is well-developed. She is not diaphoretic.  HENT:     Head: Normocephalic and atraumatic.  Cardiovascular:     Rate and Rhythm: Normal rate and regular rhythm.     Heart sounds: No murmur heard.   No friction rub. No gallop.  Pulmonary:     Effort: Pulmonary effort is normal. No respiratory distress.     Breath sounds: Normal breath sounds. No wheezing.  Abdominal:     General: Bowel sounds are normal. There is no distension.     Palpations: Abdomen is soft.     Tenderness: There is no abdominal tenderness.  Musculoskeletal:        General: Normal range of motion.     Cervical back: Normal range of motion and neck supple.  Skin:    General: Skin is warm and dry.  Neurological:     General: No focal deficit present.     Mental Status: She is alert and oriented to person, place, and time.    ED Results / Procedures / Treatments   Labs (all labs ordered are listed, but only abnormal results are displayed) Labs Reviewed - No data to display  EKG EKG Interpretation  Date/Time:  Thursday May 04 2021 02:01:54 EDT Ventricular Rate:  79 PR Interval:  116 QRS Duration: 74 QT Interval:  450 QTC Calculation: 516 R Axis:   10 Text Interpretation: Sinus rhythm Borderline short PR interval Borderline T abnormalities, diffuse leads Prolonged QT interval No significant change since 12/20/2020 Confirmed by Veryl Speak [phone]) on 05/04/2021 2:11:40 AM  Radiology NM Pulmonary Perfusion  Result Date: 05/02/2021 CLINICAL DATA:  Chest pain and elevated D-dimer. EXAM: NUCLEAR MEDICINE PERFUSION LUNG SCAN TECHNIQUE: Perfusion images were obtained in multiple projections  after intravenous injection of radiopharmaceutical. Ventilation scans intentionally deferred if perfusion scan and chest x-ray adequate for interpretation during COVID 19 epidemic. RADIOPHARMACEUTICALS:  4.04 mCi Tc-6mMAA IV COMPARISON:  None. FINDINGS: Normal perfusion is seen bilaterally with homogeneous distribution of tracer throughout both lungs. No focal perfusion defects are identified. IMPRESSION: Normal nuclear medicine perfusion lung scan. Electronically Signed   By: TVirgina NorfolkM.D.   On: 05/02/2021 21:50   DG Chest Portable 1 View  Result Date: 05/02/2021 CLINICAL DATA:  Syncope during a blood draw today. EXAM: PORTABLE CHEST 1 VIEW COMPARISON:  Single-view of the chest 01/15/2021 FINDINGS: Lungs clear. Heart size normal. No pneumothorax or pleural fluid. No acute or focal bony abnormality. IMPRESSION: No acute disease. Electronically Signed   By: TInge RiseM.D.   On: 05/02/2021 14:27    Procedures Procedures   Medications Ordered in ED Medications - No data to display  ED Course  I have reviewed the triage vital signs and the nursing  notes.  Pertinent labs & imaging results that were available during my care of the patient were reviewed by me and considered in my medical decision making (see chart for details).    MDM Rules/Calculators/A&P  Patient presenting here with weakness and cold intolerance.  She was told by her primary doctor her TSH was high and needed to come to the ER to get her thyroid hormone regulated.  Labs repeated here showing a TSH of 144.  I am uncertain as to the significance of this given the fact that she has had a thyroidectomy and is on Synthroid.  This was discussed with Dr. Josephine Cables who is recommending a dose of Solu-Cortef.  100 mg of this has been ordered and patient to be admitted to the hospitalist service.  Final Clinical Impression(s) / ED Diagnoses Final diagnoses:  None    Rx / DC Orders ED Discharge Orders     None         Veryl Speak, MD 05/04/21 [id number]

## 2021-05-04 NOTE — H&P (Signed)
History and Physical  Jeanne Kim [id number] DOB: 12-28-1940 DOA: 05/04/2021  Referring physician:  Veryl Speak, MD PCP: Neale Burly, MD  Patient coming from: Home  Chief Complaint: Abnormal lab  HPI: Jeanne Kim is a 80 y.o. female with medical history significant for T2DM, hypothyroidism, hypertension, CAD, hyperlipidemia who presents to the emergency department accompanied by her husband with complaint of weakness, feeling cold all the time (despite the environmental warm temperature).  She was seen in the ED 2 days ago (7/26) due to complaint of presyncopal episode, VQ scan done showed normal nuclear medicine perfusion lung scan and chest x-ray showed no acute disease.  She recently had a blood work done at her PCPs office and TSH was said to be elevated, so patient was asked to go to the ED for further evaluation and management.  She denies fever, chest pain, shortness of breath, abdominal pain  ED Course:  In the emergency department, she was hemodynamically stable.  Work-up in the ED showed normocytic anemia, hypokalemia, hyperglycemia, TSH 164,440, albumin 3.4.  Influenza A, B, SARS coronavirus 2 was negative. IV Solu-Cortef 100 mg x 1 was given, potassium was replenished.  Hospitalist was asked to admit patient for further evaluation and management.  Review of Systems: Constitutional: Negative for chills and fever.  HENT: Negative for ear pain and sore throat.   Eyes: Negative for pain and visual disturbance.  Respiratory: Negative for cough, chest tightness and shortness of breath.   Cardiovascular: Negative for chest pain and palpitations.  Gastrointestinal: Negative for abdominal pain and vomiting.  Endocrine: Positive for feeling cold despite environmental warm temperature.  Negative for polyphagia Genitourinary: Negative for decreased urine volume, dysuria, enuresis Musculoskeletal: Negative for arthralgias and back pain.  Skin: Negative for color change and  rash.  Allergic/Immunologic: Negative for immunocompromised state.  Neurological: Positive for weakness.  Negative for tremors, syncope, speech difficulty Hematological: Does not bruise/bleed easily.  All other systems reviewed and are negative   Past Medical History:  Diagnosis Date   Allergy    Anemia    Anginal pain ([place])    Anxiety    Arthritis    Blood transfusion without reported diagnosis    Breast cancer ([place])    Carotid artery plaque    bilat   Cataract    Colon polyps    adenomatous   Concussion 02/2016   Driving restrictions for 6w   Coronary heart disease    Depression    Diverticulosis    Dizziness    Esophageal reflux    Esophageal stricture    Fibromyalgia    Gallstones    Hiatal hernia    Hypercholesterolemia    Hypertension    Hypothyroidism    IBS (irritable bowel syndrome)    Kidney stones    Osteoporosis    Pneumonia    recently, just finished ABO   Pneumonia    PONV (postoperative nausea and vomiting)    Sleep apnea    CPAP   Thyroid disease 2002   Type II or unspecified type diabetes mellitus without mention of complication, not stated as uncontrolled    Unspecified gastritis and gastroduodenitis without mention of hemorrhage    Past Surgical History:  Procedure Laterality Date   ANAL RECTAL MANOMETRY N/A 08/05/2015   Procedure: ANO RECTAL MANOMETRY;  Surgeon: Mauri Pole, MD;  Location: [facility];  Service: Endoscopy;  Laterality: N/A;   APPENDECTOMY     BIOPSY  08/06/2020   Procedure: BIOPSY;  Surgeon: Eloise Harman, DO;  Location: [facility];  Service: Endoscopy;;   BIOPSY  12/21/2020   Procedure: BIOPSY;  Surgeon: Thornton Park, MD;  Location: [facility];  Service: Gastroenterology;;   BIOPSY  12/23/2020   Procedure: BIOPSY;  Surgeon: Thornton Park, MD;  Location: [facility];  Service: Gastroenterology;;   BREAST LUMPECTOMY     left   CARDIAC CATHETERIZATION     CATARACT EXTRACTION W/PHACO Left  03/30/2013   Procedure: CATARACT EXTRACTION PHACO AND INTRAOCULAR LENS PLACEMENT ([place]);  Surgeon: Tonny Branch, MD;  Location: [facility];  Service: Ophthalmology;  Laterality: Left;  CDE:19.28   CATARACT EXTRACTION W/PHACO Right 04/09/2013   Procedure: CATARACT EXTRACTION PHACO AND INTRAOCULAR LENS PLACEMENT ([facility]);  Surgeon: Tonny Branch, MD;  Location: [facility];  Service: Ophthalmology;  Laterality: Right;  CDE: 15.44   CERVICAL FUSION     CESAREAN SECTION     CHOLECYSTECTOMY     COLONOSCOPY WITH PROPOFOL N/A 12/23/2020   Procedure: COLONOSCOPY WITH PROPOFOL;  Surgeon: Thornton Park, MD;  Location: [facility];  Service: Gastroenterology;  Laterality: N/A;   CYSTOCELE REPAIR     DILATION AND CURETTAGE OF UTERUS     ESOPHAGOGASTRODUODENOSCOPY (EGD) WITH PROPOFOL N/A 08/06/2020   Procedure: ESOPHAGOGASTRODUODENOSCOPY (EGD) WITH PROPOFOL;  Surgeon: Eloise Harman, DO;  [facility];  Service: Endoscopy;  Laterality: N/A;   ESOPHAGOGASTRODUODENOSCOPY (EGD) WITH PROPOFOL N/A 12/21/2020   Procedure: ESOPHAGOGASTRODUODENOSCOPY (EGD) WITH PROPOFOL;  Surgeon: Thornton Park, MD;  Location: [facility];  Service: Gastroenterology;  Laterality: N/A;   EYE SURGERY Left 03-30-13   Cataract   EYE SURGERY Right 04-09-13   Cataract   RECTOCELE REPAIR     THYROIDECTOMY     TONSILLECTOMY     TOTAL ABDOMINAL HYSTERECTOMY      Social History:  reports that she has never smoked. She has never used smokeless tobacco. She reports that she does not drink alcohol and does not use drugs.   Allergies  Allergen Reactions   Codeine Hives   Sulfonamide Derivatives Hives   Canagliflozin    Penicillins Hives    Pt states she does NOT have allergy to penicillin   Sulfa Antibiotics Other (See Comments)    unknown    Family History  Problem Relation Age of Onset   Sarcoidosis Mother    Diabetes Mother    Cancer Mother        sarcoma -  Right arm amputation   Hypertension Mother    Lung cancer  Father    Diabetes Father    Heart disease Father        Before age 52   Urolithiasis Father    Cancer Father        Lung   Hyperlipidemia Father    Hypertension Father    Esophageal cancer Maternal Uncle    Ovarian cancer Maternal Aunt    Colon cancer Maternal Uncle    Lung cancer Maternal Uncle    Diabetes Maternal Uncle    Diabetes Maternal Aunt    Colon polyps Maternal Uncle    Heart disease Paternal Grandmother     Prior to Admission medications   Medication Sig Start Date End Date Taking? Authorizing Provider  acetaminophen (TYLENOL) 500 MG tablet Take 1 tablet (500 mg total) by mouth every 8 (eight) hours as needed for moderate pain or headache. 09/29/20 09/29/21  Thurnell Lose, MD  amLODipine (NORVASC) 5 MG tablet Take 5 mg by mouth daily. 09/08/20   [provider]  aspirin 81 MG chewable tablet Chew 1 tablet (81 mg total) by mouth daily. 10/13/20   Thurnell Lose, MD  atorvastatin (LIPITOR) 20 MG tablet Take 1 tablet (20 mg total) by mouth daily. 10/17/12   Hillary Bow, MD  CALCIUM-MAGNESIUM-ZINC PO Take 3 tablets by mouth daily.    [provider]  Cholecalciferol (VITAMIN D3) 50 MCG (2000 UT) capsule Take 2,000 Units by mouth daily. 09/27/20   [provider]  ciprofloxacin (CILOXAN) 0.3 % ophthalmic solution 1 drop 2 (two) times daily. 04/24/21   [provider]  Coenzyme Q10 (CO Q-10) 100 MG CAPS Take 100 mg by mouth daily.    [provider]  cyanocobalamin (,VITAMIN B-12,) 1000 MCG/ML injection INJECT 1 ML INTO MUSCLE ONCE A MONTH Patient taking differently: Inject 1,000 mcg into the muscle every 30 (thirty) days. 04/27/19   Mauri Pole, MD  doxepin (SINEQUAN) 10 MG capsule Take 10 mg by mouth at bedtime. 02/16/19   [provider]  DULoxetine (CYMBALTA) 60 MG capsule Take 60 mg by mouth daily.    [provider]  EPINEPHrine 0.3 mg/0.3 mL IJ SOAJ injection Inject 0.3 mg into the muscle daily  as needed for anaphylaxis. 07/18/09   [provider]  ferrous sulfate 325 (65 FE) MG tablet Take 325 mg by mouth daily. 10/13/20   [provider]  fexofenadine (ALLEGRA) 30 MG tablet Take 30 mg by mouth daily. Patient not taking: Reported on 05/02/2021    [provider]  fluticasone (FLONASE) 50 MCG/ACT nasal spray Place 2 sprays into the nose daily.    [provider]  furosemide (LASIX) 20 MG tablet Take 20 mg by mouth daily. 05/01/21   [provider]  furosemide (LASIX) 40 MG tablet Take 1 tablet (40 mg total) by mouth 3 (three) times a week. Take on Monday/Wednesday/Friday Patient not taking: Reported on 05/02/2021 07/06/20   Richardson Dopp T, PA-C  gabapentin (NEURONTIN) 300 MG capsule Take 300 mg by mouth at bedtime.    [provider]  gentamicin (GARAMYCIN) 0.3 % ophthalmic solution Place 1 drop into both eyes 3 (three) times daily as needed (red or irritated eyes). 06/16/20   [provider]  hydrOXYzine (VISTARIL) 25 MG capsule Take 50 mg by mouth every 8 (eight) hours as needed for anxiety or itching. Patient not taking: Reported on 05/02/2021 11/04/20   [provider]  Insulin Glargine-Lixisenatide 100-33 UNT-MCG/ML SOPN Inject 20-25 Units into the skin daily. Pt adjust between 20-25 units    [provider]  linaclotide (LINZESS) 290 MCG CAPS capsule Take 1 capsule (290 mcg total) by mouth daily. MUST HAVE OFFICE VISIT FOR ADDITIONAL REFILLS 04/04/21   Mauri Pole, MD  magnesium oxide (MAG-OX) 400 MG tablet Take 400 mg by mouth daily.    [provider]  meclizine (ANTIVERT) 25 MG tablet Take 1 tablet (25 mg total) by mouth 3 (three) times daily as needed for dizziness. 09/29/20   Thurnell Lose, MD  Melatonin 10 MG TABS Take 10 mg by mouth at bedtime as needed (sleep).    [provider]  metFORMIN (GLUCOPHAGE) 1000 MG tablet Take 1,000 mg by mouth 2 (two) times daily with a meal.     [provider]  metolazone (ZAROXOLYN) 5 MG tablet Take 5 mg by mouth daily. Patient not taking: Reported on 05/02/2021    [provider]  metoprolol succinate (TOPROL-XL) 50 MG 24 hr tablet TAKE 1 TABLET  BY MOUTH EVERY DAY Patient taking differently: Take 50 mg by mouth daily. 07/08/20   Sherren Mocha, MD  midodrine (PROAMATINE) 10 MG tablet Take 10 mg by mouth daily. 12/06/20   [provider]  montelukast (SINGULAIR) 10 MG tablet Take 10 mg by mouth at bedtime.    [provider]  Needles & Syringes MISC 25G 5/8 inch needle on 3 ml syringe-Use once monthly for B12 injections 07/20/15   Nandigam, Karleen Hampshire V, MD  nitroGLYCERIN (NITROSTAT) 0.4 MG SL tablet PLACE 1 TABLET (0.4 MG TOTAL) UNDER THE TONGUE EVERY 5 (FIVE) MINUTES AS NEEDED FOR CHEST PAIN. 12/03/19   Sherren Mocha, MD  NOVOLOG FLEXPEN 100 UNIT/ML FlexPen Inject 0-20 Units into the skin daily. At night , on sliding scale. 11/16/20   [provider]  nystatin (MYCOSTATIN) 100000 UNIT/ML suspension Use as directed 5 mLs in the mouth or throat daily as needed (mouthwash). Patient not taking: Reported on 05/02/2021 09/27/20   [provider]  Omega-3 Fatty Acids (FISH OIL) 1000 MG CAPS Take 1 capsule by mouth daily. Patient not taking: Reported on 05/02/2021 07/18/09   [provider]  ondansetron (ZOFRAN) 4 MG tablet Take 1 tablet (4 mg total) by mouth every 8 (eight) hours as needed for nausea or vomiting. 09/29/20   Thurnell Lose, MD  [facility] VERIO test strip 1 each by Other route as directed.  04/18/16   [provider]  pantoprazole (PROTONIX) 40 MG tablet Take 1 tablet (40 mg total) by mouth 2 (two) times daily. 08/07/20   Mariel Aloe, MD  potassium chloride (KLOR-CON) 10 MEQ tablet Take 1 tablet (10 mEq total) by mouth daily for 5 days. 05/02/21 05/07/21  Loni Beckwith, PA-C  ramipril (ALTACE) 5 MG capsule Take 1 capsule (5 mg total) by mouth at bedtime.  01/11/16   Sherren Mocha, MD  SYNTHROID 137 MCG tablet Take 137 mcg by mouth daily. 09/30/20   [provider]  traMADol (ULTRAM) 50 MG tablet Take 50 mg by mouth daily as needed for moderate pain. 10/25/20   [provider]    Physical Exam: BP 135/65   Pulse 71   Temp 98.3 F (36.8 C) (Oral)   Resp 20   Ht '5\' 4"'$  (1.626 m)   Wt 76 kg   SpO2 98%   BMI 28.76 kg/m   General: 80 y.o. year-old female well developed well nourished in no acute distress.  Alert and oriented x3. HEENT: NCAT, EOMI Neck: Supple, trachea medial Cardiovascular: Regular rate and rhythm with no rubs or gallops.  No thyromegaly or JVD noted.  No lower extremity edema. 2/4 pulses in all 4 extremities. Respiratory: Clear to auscultation with no wheezes or rales. Good inspiratory effort. Abdomen: Soft, nontender nondistended with normal bowel sounds x4 quadrants. Muskuloskeletal: No cyanosis, clubbing or edema noted bilaterally Neuro: CN II-XII intact, strength 5/5 x 4, sensation, reflexes intact Skin: No ulcerative lesions noted or rashes Psychiatry: Judgement and insight appear normal. Mood is appropriate for condition and setting          Labs on Admission:  Basic Metabolic Panel: Recent Labs  Lab 05/02/21 1439 05/04/21 0308  NA 134* 135  K 3.1* 2.9*  CL 96* 99  CO2 29 27  GLUCOSE 237* 200*  BUN 11 11  CREATININE 0.94 0.79  CALCIUM 8.0* 7.9*   Liver Function Tests: Recent Labs  Lab 05/02/21 1439 05/04/21 0308  AST 21 22  ALT 19 17  ALKPHOS 60 58  BILITOT 1.1 1.0  PROT 6.7 6.6  ALBUMIN 3.5 3.4*   No results for input(s): LIPASE, AMYLASE in the last 168 hours. No results for input(s): AMMONIA in the last 168 hours. CBC: Recent Labs  Lab 05/02/21 1439 05/04/21 0308  WBC 11.0* 9.9  NEUTROABS 7.2 5.6  HGB 9.4* 8.4*  HCT 31.3* 28.4*  MCV 80.7 80.2  PLT 490* 446*   Cardiac Enzymes: No results for input(s): CKTOTAL, CKMB, CKMBINDEX, TROPONINI in the last 168  hours.  BNP (last 3 results) Recent Labs    08/05/20 1806  BNP 63.0    ProBNP (last 3 results) Recent Labs    07/04/20 1524  PROBNP 165    CBG: Recent Labs  Lab 05/02/21 1438  GLUCAP 217*    Radiological Exams on Admission: NM Pulmonary Perfusion  Result Date: 05/02/2021 CLINICAL DATA:  Chest pain and elevated D-dimer. EXAM: NUCLEAR MEDICINE PERFUSION LUNG SCAN TECHNIQUE: Perfusion images were obtained in multiple projections after intravenous injection of radiopharmaceutical. Ventilation scans intentionally deferred if perfusion scan and chest x-ray adequate for interpretation during COVID 19 epidemic. RADIOPHARMACEUTICALS:  4.04 mCi Tc-69mMAA IV COMPARISON:  None. FINDINGS: Normal perfusion is seen bilaterally with homogeneous distribution of tracer throughout both lungs. No focal perfusion defects are identified. IMPRESSION: Normal nuclear medicine perfusion lung scan. Electronically Signed   By: TVirgina NorfolkM.D.   On: 05/02/2021 21:50   DG Chest Portable 1 View  Result Date: 05/02/2021 CLINICAL DATA:  Syncope during a blood draw today. EXAM: PORTABLE CHEST 1 VIEW COMPARISON:  Single-view of the chest 01/15/2021 FINDINGS: Lungs clear. Heart size normal. No pneumothorax or pleural fluid. No acute or focal bony abnormality. IMPRESSION: No acute disease. Electronically Signed   By: TInge RiseM.D.   On: 05/02/2021 14:27    EKG: I independently viewed the EKG done and my findings are as followed: Normal sinus rhythm at a rate of 79 bpm with QTc 516 ms  Assessment/Plan Present on Admission:  Hypothyroidism  Hypokalemia  Gastroesophageal reflux disease  Principal Problem:   Hypothyroidism Active Problems:   Essential hypertension   Gastroesophageal reflux disease   Hypokalemia   Hyperglycemia due to diabetes mellitus (HCC)   Generalized weakness   Hypoalbuminemia due to protein-calorie malnutrition (H[facility])   Hyperlipidemia   Prolonged QT  interval  Generalized weakness possibly secondary to poorly controlled hypothyroidism TSH 164, 440, free T4 will be checked IV Solu-Cortef 100 mg x 1 was given in the ED Synthroid will be increased to 150 mcg from 137 mcg Patient will need outpatient follow-up with endocrinologist  Hypokalemia K+ is 2.9 K+ will be replenished Please monitor for AM K+ for further replenishmemnt  Prolonged QTc (516 ms) Avoid QT prolonging drugs Magnesium level will be checked Repeat EKG in the morning  Hyperglycemia secondary to T2DM Continue Levemir 10 units daily (takes 20 to 25 units of glargine lixisenatide at home); adjust dose as needed Continue ISS and hypoglycemic protocol  Hypoalbuminemia possible secondary to mild protein calorie malnutrition Albumin 3.4, protein supplements will be provided  Essential hypertension (controlled) Continue amlodipine  Hyperlipidemia Continue Lipitor  GERD Continue Protonix  DVT prophylaxis: Lovenox  Code Status: Full code  Family Communication: Husband at bedside (all questions answered to satisfaction)  Disposition Plan:  Patient is from:                        home Anticipated DC to:  SNF or family members home Anticipated DC date:               2-3 days Anticipated DC barriers:         patient requires inpatient management due to hypothyroidism  Consults called: None  Admission status: Observation    Bernadette Hoit MD [facility]  05/04/2021, 7:18 AM

## 2021-05-04 NOTE — ED Notes (Signed)
Family at bedside. 

## 2021-05-04 NOTE — Care Management Obs Status (Signed)
[place] NOTIFICATION   Patient Details  Name: Jeanne Kim MRN: [id number] Date of Birth: 03-12-1941   Medicare Observation Status Notification Given:  Yes    Iona Beard, [place] 05/04/2021, 2:00 PM

## 2021-05-04 NOTE — ED Notes (Signed)
Patient give bag lunch and diet ginger ale to drink

## 2021-05-04 NOTE — ED Notes (Signed)
Patient's husband at desk asking for warm blanket at this time. Upon entering room patient shivering. Wrapped blanket around shoulders.

## 2021-05-04 NOTE — Progress Notes (Signed)
Same day note  Patient seen and examined at bedside.  Patient was admitted to the hospital for weakness, feeling up cold all the time.  At the time of my evaluation, patient complains of feeling cold with generalized weakness and fatigue.  Physical examination reveals elderly female, coarse skin, appears fatigued and weak,  Laboratory data and imaging was reviewed  Assessment and Plan.  Generalized weakness likely secondary to poorly controlled hypothyroidism with hypomagnesemia and hypokalemia.  TSH was elevated at 164.  Patient was on 137 mcg of Synthroid and will be increased to 150 mcg.  Received 1 dose of IV Solu-Cortef 100 mg in the ED.  Check free T4.  Hypomagnesemia.  We will replenish.  Check levels in a.m.  Hypokalemia we will replenish.  will closely monitor.  Prolonged QTC.  We will replenish electrolytes including magnesium and potassium.  Monitor closely.  Hyperglycemia secondary to type 2 diabetes.  On Levemir 10 units daily.  Continue sliding scale insulin.  Takes 20 to 25 units of glargine at home  Essential hypertension.  On amlodipine.  We will continue.  GERD continue Protonix  Hyperlipidemia continue Lipitor.  Spoke with the patient's husband at bedside.  No Charge  Signed,  Delila Pereyra, MD [facility]

## 2021-05-04 NOTE — ED Notes (Signed)
Patient ambulated to restroom with standby assist. Patient gave urine specimen at this time.

## 2021-05-04 NOTE — Evaluation (Signed)
Physical Therapy Evaluation Patient Details Name: Jeanne Kim MRN: [id number] DOB: 1941/05/26 Today's Date: 05/04/2021   History of Present Illness  Jeanne Kim is a 80 y.o. female with medical history significant for T2DM, hypothyroidism, hypertension, CAD, hyperlipidemia who presents to the emergency department accompanied by her husband with complaint of weakness, feeling cold all the time (despite the environmental warm temperature).  She was seen in the ED 2 days ago (7/26) due to complaint of presyncopal episode, VQ scan done showed normal nuclear medicine perfusion lung scan and chest x-ray showed no acute disease.  She recently had a blood work done at her PCPs office and TSH was said to be elevated, so patient was asked to go to the ED for further evaluation and management.  She denies fever, chest pain, shortness of breath, abdominal pain   Clinical Impression  Patient functioning near baseline for functional mobility and gait demonstrating good return for transferring to commode in bathroom and ambulation in room/hallways without loss of balance.  Patient encouraged to ambulate ad lib in room and with nursing staff, family members as tolerated in hallways.  Plan:  Patient discharged from physical therapy to care of nursing for ambulation daily as tolerated for length of stay.      Follow Up Recommendations Supervision for mobility/OOB;Supervision - Intermittent    Equipment Recommendations  None recommended by PT    Recommendations for Other Services       Precautions / Restrictions Precautions Precautions: None Restrictions Weight Bearing Restrictions: No      Mobility  Bed Mobility Overal bed mobility: Modified Independent                  Transfers Overall transfer level: Modified independent                  Ambulation/Gait Ambulation/Gait assistance: Modified independent (Device/Increase time) Gait Distance (Feet): 150 Feet Assistive device:  None Gait Pattern/deviations: WFL(Within Functional Limits) Gait velocity: decreased   General Gait Details: grossly WFL demonstrating good return for ambulation in room and hallway with slightly labored cadence without loss of balance  Stairs            Wheelchair Mobility    Modified Rankin (Stroke Patients Only)       Balance Overall balance assessment: Mild deficits observed, not formally tested                                           Pertinent Vitals/Pain Pain Assessment: No/denies pain    Home Living Family/patient expects to be discharged to:: Private residence Living Arrangements: Spouse/significant other Available Help at Discharge: Family;Available 24 hours/day Type of Home: House Home Access: Stairs to enter Entrance Stairs-Rails: Right Entrance Stairs-Number of Steps: 2 Home Layout: Laundry or work area in basement;Able to live on main level with bedroom/bathroom;Two level Home Equipment: Hand held shower head;Grab bars - tub/shower;Shower seat;Walker - 2 wheels;Cane - single point;Walker - 4 wheels      Prior Function Level of Independence: Independent with assistive device(s)         Comments: Household and short distanced community ambulator with RW PRN, drives occasionally     Hand Dominance   Dominant Hand: Right    Extremity/Trunk Assessment   Upper Extremity Assessment Upper Extremity Assessment: Overall WFL for tasks assessed    Lower Extremity Assessment Lower Extremity Assessment: Overall [facility]  for tasks assessed    Cervical / Trunk Assessment Cervical / Trunk Assessment: Normal  Communication   Communication: No difficulties  Cognition Arousal/Alertness: Awake/alert Behavior During Therapy: WFL for tasks assessed/performed Overall Cognitive Status: Within Functional Limits for tasks assessed                                        General Comments      Exercises     Assessment/Plan     PT Assessment Patent does not need any further PT services  PT Problem List         PT Treatment Interventions      PT Goals (Current goals can be found in the Care Plan section)  Acute Rehab PT Goals Patient Stated Goal: return home with family to assist PT Goal Formulation: With patient Time For Goal Achievement: 05/04/21 Potential to Achieve Goals: Good    Frequency     Barriers to discharge        Co-evaluation               AM-PAC PT "6 Clicks" Mobility  Outcome Measure Help needed turning from your back to your side while in a flat bed without using bedrails?: None Help needed moving from lying on your back to sitting on the side of a flat bed without using bedrails?: None Help needed moving to and from a bed to a chair (including a wheelchair)?: None Help needed standing up from a chair using your arms (e.g., wheelchair or bedside chair)?: None Help needed to walk in hospital room?: None Help needed climbing 3-5 steps with a railing? : None 6 Click Score: 24    End of Session   Activity Tolerance: Patient tolerated treatment well Patient left: in chair;with call bell/phone within reach Nurse Communication: Mobility status PT Visit Diagnosis: Unsteadiness on feet (R26.81);Other abnormalities of gait and mobility (R26.89);Muscle weakness (generalized) (M62.81)    Time: [id number] PT Time Calculation (min) (ACUTE ONLY): 22 min   Charges:   PT Evaluation $PT Eval Moderate Complexity: 1 Mod PT Treatments $Therapeutic Activity: 23-37 mins        3:57 PM, 05/04/21 Lonell Grandchild, MPT Physical Therapist with [facility] 336 [phone] office [phone] mobile phone

## 2021-05-05 DIAGNOSIS — I10 Essential (primary) hypertension: Secondary | ICD-10-CM | POA: Diagnosis not present

## 2021-05-05 DIAGNOSIS — E46 Unspecified protein-calorie malnutrition: Secondary | ICD-10-CM

## 2021-05-05 DIAGNOSIS — K219 Gastro-esophageal reflux disease without esophagitis: Secondary | ICD-10-CM | POA: Diagnosis not present

## 2021-05-05 DIAGNOSIS — E8809 Other disorders of plasma-protein metabolism, not elsewhere classified: Secondary | ICD-10-CM

## 2021-05-05 DIAGNOSIS — R531 Weakness: Secondary | ICD-10-CM | POA: Diagnosis not present

## 2021-05-05 DIAGNOSIS — E039 Hypothyroidism, unspecified: Secondary | ICD-10-CM | POA: Diagnosis not present

## 2021-05-05 LAB — CBC
HCT: 26.9 % — ABNORMAL LOW (ref 36.0–46.0)
Hemoglobin: 8.1 g/dL — ABNORMAL LOW (ref 12.0–15.0)
MCH: 23.6 pg — ABNORMAL LOW (ref 26.0–34.0)
MCHC: 30.1 g/dL (ref 30.0–36.0)
MCV: 78.4 fL — ABNORMAL LOW (ref 80.0–100.0)
Platelets: 466 10*3/uL — ABNORMAL HIGH (ref 150–400)
RBC: 3.43 MIL/uL — ABNORMAL LOW (ref 3.87–5.11)
RDW: 15.9 % — ABNORMAL HIGH (ref 11.5–15.5)
WBC: 9.5 10*3/uL (ref 4.0–10.5)
nRBC: 0 % (ref 0.0–0.2)

## 2021-05-05 LAB — PHOSPHORUS: Phosphorus: 3.3 mg/dL (ref 2.5–4.6)

## 2021-05-05 LAB — COMPREHENSIVE METABOLIC PANEL
ALT: 15 U/L (ref 0–44)
AST: 19 U/L (ref 15–41)
Albumin: 3.2 g/dL — ABNORMAL LOW (ref 3.5–5.0)
Alkaline Phosphatase: 49 U/L (ref 38–126)
Anion gap: 6 (ref 5–15)
BUN: 18 mg/dL (ref 8–23)
CO2: 26 mmol/L (ref 22–32)
Calcium: 8.6 mg/dL — ABNORMAL LOW (ref 8.9–10.3)
Chloride: 101 mmol/L (ref 98–111)
Creatinine, Ser: 0.85 mg/dL (ref 0.44–1.00)
GFR, Estimated: >60 mL/min (ref >60)
Glucose, Bld: 206 mg/dL — ABNORMAL HIGH (ref 70–99)
Potassium: 3.5 mmol/L (ref 3.5–5.1)
Sodium: 133 mmol/L — ABNORMAL LOW (ref 135–145)
Total Bilirubin: 1.2 mg/dL (ref 0.3–1.2)
Total Protein: 6.2 g/dL — ABNORMAL LOW (ref 6.5–8.1)

## 2021-05-05 LAB — HEMOGLOBIN A1C
Hgb A1c MFr Bld: 8.4 % — ABNORMAL HIGH (ref 4.8–5.6)
Mean Plasma Glucose: 194 mg/dL

## 2021-05-05 LAB — GLUCOSE, CAPILLARY
Glucose-Capillary: 206 mg/dL — ABNORMAL HIGH (ref 70–99)
Glucose-Capillary: 342 mg/dL — ABNORMAL HIGH (ref 70–99)

## 2021-05-05 LAB — PROTIME-INR
INR: 1 (ref 0.8–1.2)
Prothrombin Time: 13.5 seconds (ref 11.4–15.2)

## 2021-05-05 LAB — MAGNESIUM: Magnesium: 1.5 mg/dL — ABNORMAL LOW (ref 1.7–2.4)

## 2021-05-05 LAB — APTT: aPTT: 32 seconds (ref 24–36)

## 2021-05-05 MED ORDER — GABAPENTIN 300 MG PO CAPS: 300.0000 mg | ORAL_CAPSULE | Freq: Every day | ORAL | Status: DC

## 2021-05-05 MED ORDER — METOPROLOL SUCCINATE ER 50 MG PO TB24
50.0000 mg | ORAL_TABLET | Freq: Every day | ORAL | Status: DC
  Administered 2021-05-05: 50 mg via ORAL
  Filled 2021-05-05: qty 1

## 2021-05-05 MED ORDER — DULOXETINE HCL 60 MG PO CPEP
60.0000 mg | ORAL_CAPSULE | Freq: Every day | ORAL | Status: DC
  Administered 2021-05-05: 60 mg via ORAL
  Filled 2021-05-05: qty 1

## 2021-05-05 MED ORDER — LINACLOTIDE 145 MCG PO CAPS
290.0000 ug | ORAL_CAPSULE | Freq: Every day | ORAL | Status: DC
  Administered 2021-05-05: 290 ug via ORAL
  Filled 2021-05-05: qty 2

## 2021-05-05 MED ORDER — LEVOTHYROXINE SODIUM 150 MCG PO TABS: 150.0000 ug | ORAL_TABLET | Freq: Every day | ORAL | 2 refills | Status: DC

## 2021-05-05 MED ORDER — RAMIPRIL 5 MG PO CAPS: 5.0000 mg | ORAL_CAPSULE | Freq: Every day | ORAL | Status: DC

## 2021-05-05 MED ORDER — VITAMIN D 25 MCG (1000 UNIT) PO TABS
2000.0000 [IU] | ORAL_TABLET | Freq: Every day | ORAL | Status: DC
  Administered 2021-05-05: 2000 [IU] via ORAL
  Filled 2021-05-05: qty 2

## 2021-05-05 MED ORDER — HYDROXYZINE HCL 10 MG PO TABS: 50.0000 mg | ORAL_TABLET | Freq: Three times a day (TID) | ORAL | Status: DC | PRN

## 2021-05-05 MED ORDER — ACETAMINOPHEN 500 MG PO TABS: 500.0000 mg | ORAL_TABLET | Freq: Three times a day (TID) | ORAL | Status: DC | PRN

## 2021-05-05 MED ORDER — TRAMADOL HCL 50 MG PO TABS
50.0000 mg | ORAL_TABLET | Freq: Every day | ORAL | Status: DC | PRN
  Administered 2021-05-05: 50 mg via ORAL
  Filled 2021-05-05: qty 1

## 2021-05-05 MED ORDER — DOXEPIN HCL 10 MG PO CAPS
10.0000 mg | ORAL_CAPSULE | Freq: Every day | ORAL | Status: DC
  Filled 2021-05-05 (×3): qty 1

## 2021-05-05 MED ORDER — MAGNESIUM SULFATE 2 GM/50ML IV SOLN
2.0000 g | Freq: Once | INTRAVENOUS | Status: AC
  Administered 2021-05-05: 2 g via INTRAVENOUS
  Filled 2021-05-05: qty 50

## 2021-05-05 MED ORDER — MIDODRINE HCL 5 MG PO TABS: 10.0000 mg | ORAL_TABLET | Freq: Every day | ORAL | Status: DC

## 2021-05-05 NOTE — Progress Notes (Signed)
Nsg Discharge Note  Admit Date:  05/04/2021 Discharge date: 05/05/2021   [name] to be D/C'd Home per MD order.  AVS completed.  Copy for chart, and copy for patient signed, and dated. Patient/caregiver able to verbalize understanding.  Discharge Medication: Allergies as of 05/05/2021       Reactions   Codeine Hives   Sulfonamide Derivatives Hives   Canagliflozin    Penicillins Hives   Pt states she does NOT have allergy to penicillin   Sulfa Antibiotics Other (See Comments)   unknown        Medication List     TAKE these medications    acetaminophen 500 MG tablet Commonly known as: TYLENOL Take 1 tablet (500 mg total) by mouth every 8 (eight) hours as needed for moderate pain or headache.   amLODipine 5 MG tablet Commonly known as: NORVASC Take 5 mg by mouth daily.   aspirin 81 MG chewable tablet Chew 1 tablet (81 mg total) by mouth daily.   atorvastatin 20 MG tablet Commonly known as: LIPITOR Take 1 tablet (20 mg total) by mouth daily.   CALCIUM-MAGNESIUM-ZINC PO Take 3 tablets by mouth daily.   ciprofloxacin 0.3 % ophthalmic solution Commonly known as: CILOXAN 1 drop 2 (two) times daily.   Co Q-10 100 MG Caps Take 100 mg by mouth daily.   doxepin 10 MG capsule Commonly known as: SINEQUAN Take 10 mg by mouth at bedtime.   DULoxetine 60 MG capsule Commonly known as: CYMBALTA Take 60 mg by mouth daily.   EPINEPHrine 0.3 mg/0.3 mL Soaj injection Commonly known as: EPI-PEN Inject 0.3 mg into the muscle daily as needed for anaphylaxis.   ferrous sulfate 325 (65 FE) MG tablet Take 325 mg by mouth daily.   fexofenadine 30 MG tablet Commonly known as: ALLEGRA Take 30 mg by mouth daily.   Fish Oil 1000 MG Caps Take 1 capsule by mouth daily.   fluticasone 50 MCG/ACT nasal spray Commonly known as: FLONASE Place 2 sprays into the nose daily.   furosemide 40 MG tablet Commonly known as: LASIX Take 1 tablet (40 mg total) by mouth 3 (three)  times a week. Take on Monday/Wednesday/Friday   furosemide 20 MG tablet Commonly known as: LASIX Take 20 mg by mouth daily.   gabapentin 300 MG capsule Commonly known as: NEURONTIN Take 300 mg by mouth at bedtime.   gentamicin 0.3 % ophthalmic solution Commonly known as: GARAMYCIN Place 1 drop into both eyes 3 (three) times daily as needed (red or irritated eyes).   hydrOXYzine 25 MG capsule Commonly known as: VISTARIL Take 50 mg by mouth every 8 (eight) hours as needed for anxiety or itching.   Insulin Glargine-Lixisenatide 100-33 UNT-MCG/ML Sopn Inject 20-25 Units into the skin daily. Pt adjust between 20-25 units   levothyroxine 150 MCG tablet Commonly known as: SYNTHROID Take 1 tablet (150 mcg total) by mouth daily at 6 (six) AM. Start taking on: May 06, 2021 What changed:  medication strength how much to take when to take this   linaclotide 290 MCG Caps capsule Commonly known as: Linzess Take 1 capsule (290 mcg total) by mouth daily. MUST HAVE OFFICE VISIT FOR ADDITIONAL REFILLS   magnesium oxide 400 MG tablet Commonly known as: MAG-OX Take 400 mg by mouth daily.   meclizine 25 MG tablet Commonly known as: ANTIVERT Take 1 tablet (25 mg total) by mouth 3 (three) times daily as needed for dizziness.   Melatonin 10 MG Tabs Take 10  mg by mouth at bedtime as needed (sleep).   metFORMIN 1000 MG tablet Commonly known as: GLUCOPHAGE Take 1,000 mg by mouth 2 (two) times daily with a meal.   metolazone 5 MG tablet Commonly known as: ZAROXOLYN Take 5 mg by mouth daily.   metoprolol succinate 50 MG 24 hr tablet Commonly known as: TOPROL-XL TAKE 1 TABLET BY MOUTH EVERY DAY   midodrine 10 MG tablet Commonly known as: PROAMATINE Take 10 mg by mouth daily.   montelukast 10 MG tablet Commonly known as: SINGULAIR Take 10 mg by mouth at bedtime.   nitroGLYCERIN 0.4 MG SL tablet Commonly known as: NITROSTAT PLACE 1 TABLET (0.4 MG TOTAL) UNDER THE TONGUE EVERY 5  (FIVE) MINUTES AS NEEDED FOR CHEST PAIN.   NovoLOG FlexPen 100 UNIT/ML FlexPen Generic drug: insulin aspart Inject 0-20 Units into the skin daily. At night , on sliding scale.   nystatin 100000 UNIT/ML suspension Commonly known as: MYCOSTATIN Use as directed 5 mLs in the mouth or throat daily as needed (mouthwash).   ondansetron 4 MG tablet Commonly known as: Zofran Take 1 tablet (4 mg total) by mouth every 8 (eight) hours as needed for nausea or vomiting.   pantoprazole 40 MG tablet Commonly known as: PROTONIX Take 1 tablet (40 mg total) by mouth 2 (two) times daily.   potassium chloride 10 MEQ tablet Commonly known as: KLOR-CON Take 1 tablet (10 mEq total) by mouth daily for 5 days.   ramipril 5 MG capsule Commonly known as: ALTACE Take 1 capsule (5 mg total) by mouth at bedtime.   traMADol 50 MG tablet Commonly known as: ULTRAM Take 50 mg by mouth daily as needed for moderate pain.   Vitamin D3 50 MCG (2000 UT) capsule Take 2,000 Units by mouth daily.       ASK your doctor about these medications    cyanocobalamin 1000 MCG/ML injection Commonly known as: (VITAMIN B-12) INJECT 1 ML INTO MUSCLE ONCE A MONTH        Discharge Assessment: Vitals:   05/05/21 0352 05/05/21 0812  BP: 128/63 123/83  Pulse: 70 66  Resp: 19   Temp: 98.3 F (36.8 C)   SpO2: 99%    Skin clean, dry and intact without evidence of skin break down, no evidence of skin tears noted. IV catheter discontinued intact. Site without signs and symptoms of complications - no redness or edema noted at insertion site, patient denies c/o pain - only slight tenderness at site.  Dressing with slight pressure applied.  D/c Instructions-Education: Discharge instructions given to patient/family with verbalized understanding. D/c education completed with patient/family including follow up instructions, medication list, d/c activities limitations if indicated, with other d/c instructions as indicated by MD  - patient able to verbalize understanding, all questions fully answered. Patient instructed to return to ED, call 911, or call MD for any changes in condition.  Patient escorted via [place], and D/C home via private auto.  Clovis Fredrickson, LPN [id number] PM

## 2021-05-05 NOTE — Discharge Summary (Addendum)
Physician Discharge Summary  Jeanne Kim [id number] DOB: 07/27/1941 DOA: 05/04/2021  PCP: Neale Burly, MD  Admit date: 05/04/2021 Discharge date: 05/05/2021  Admitted From: Home  Discharge disposition: Home  Recommendations for Outpatient Follow-Up:   Follow up with your primary care provider in one week.  Check CBC, BMP, magnesium in the next visit Synthroid dose has been increased from 137 mcg to 150 micrograms.  This will need to be adjusted as outpatient.  Please check TSH in 4 to 6 weeks. Patient would benefit from follow-up with her endocrine specialist  Discharge Diagnosis:   Principal Problem:   Hypothyroidism Active Problems:   Essential hypertension   Gastroesophageal reflux disease   Hypokalemia   Hyperglycemia due to diabetes mellitus (HCC)   Generalized weakness   Hypoalbuminemia due to protein-calorie malnutrition (HCC)   Hyperlipidemia   Prolonged QT interval   Discharge Condition: Improved.  Diet recommendation: Low sodium, heart healthy.    Wound care: None.  Code status: Full.   History of Present Illness:  Jeanne Kim is a 80 y.o. female with medical history significant for diabetes mellitus type 2, hypothyroidism, hypertension, CAD, hyperlipidemia presented to the hospital with complaints of weakness and feeling cold.  Patient was seen in the ED on 726 due to presyncopal episode.  VQ scan was done which was negative.  She was then referred to her PCP where her TSH was noted to be extremely elevated.  Patient was then sent to the hospital.  In the ED patient was hemodynamically stable.  TSH was elevated at 164.  Her blood pressure was stable.  She had hypokalemia and received potassium supplement.  Patient was then admitted hospital for further evaluation and treatment.    Hospital Course:   Following conditions were addressed during hospitalization as listed below,  Generalized weakness likely secondary to poorly controlled  hypothyroidism with hypomagnesemia and hypokalemia.  Improved at this time.  TSH was elevated at 164.  Patient was on 137 mcg of Synthroid and will be increased to 150 mcg on discharge..  Received 1 dose of IV Solu-Cortef 100 mg in the ED. Free T4 was less than 0.25.  Will need adjustment of Synthroid dose as outpatient.   Hypomagnesemia.  Replenished.   Hypokalemia replenished   Prolonged QTC.  Magnesium and potassium were replenished   Hyperglycemia secondary to type 2 diabetes.  Resume insulin regimen from home on discharge   GERD continue Protonix  Disposition.  At this time, patient is stable for disposition home with outpatient PCP follow-up.  She was encouraged to follow-up with her endocrine specialist as well  Medical Consultants:   None.  Procedures:    None Subjective:   Today, patient was seen and examined bedside.  Feels better.  Still complains of cold feeling but ambulated with physical therapy  Discharge Exam:   Vitals:   05/05/21 0352 05/05/21 0812  BP: 128/63 123/83  Pulse: 70 66  Resp: 19   Temp: 98.3 F (36.8 C)   SpO2: 99%    Vitals:   05/04/21 2027 05/04/21 2320 05/05/21 0352 05/05/21 0812  BP: 123/71 120/67 128/63 123/83  Pulse: 79 75 70 66  Resp: '20 20 19   '$ Temp: 98.7 F (37.1 C) 98.4 F (36.9 C) 98.3 F (36.8 C)   TempSrc: Oral Oral    SpO2: 100% 99% 99%   Weight:      Height:       Body mass index is 28.76 kg/m.  General: Alert awake, not in obvious distress HENT: pupils equally reacting to light,  No scleral pallor or icterus noted. Oral mucosa is moist.  Chest:  Clear breath sounds.  Diminished breath sounds bilaterally. No crackles or wheezes.  CVS: S1 &S2 heard. No murmur.  Regular rate and rhythm. Abdomen: Soft, nontender, nondistended.  Bowel sounds are heard.   Extremities: No cyanosis, clubbing or edema.  Peripheral pulses are palpable. Psych: Alert, awake and oriented, normal mood CNS:  No cranial nerve deficits.  Power  equal in all extremities.   Skin: Warm and dry.  Dry coarse skin.  The results of significant diagnostics from this hospitalization (including imaging, microbiology, ancillary and laboratory) are listed below for reference.     Diagnostic Studies:   No results found.   Labs:   Basic Metabolic Panel: Recent Labs  Lab 05/02/21 1439 05/04/21 0308 05/05/21 0614  NA 134* 135 133*  K 3.1* 2.9* 3.5  CL 96* 99 101  CO2 '29 27 26  '$ GLUCOSE 237* 200* 206*  BUN '11 11 18  '$ CREATININE 0.94 0.79 0.85  CALCIUM 8.0* 7.9* 8.6*  MG  --  1.2* 1.5*  PHOS  --  3.3 3.3   GFR Estimated Creatinine Clearance: 54.4 mL/min (by C-G formula based on SCr of 0.85 mg/dL). Liver Function Tests: Recent Labs  Lab 05/02/21 1439 05/04/21 0308 05/05/21 0614  AST '21 22 19  '$ ALT '19 17 15  '$ ALKPHOS 60 58 49  BILITOT 1.1 1.0 1.2  PROT 6.7 6.6 6.2*  ALBUMIN 3.5 3.4* 3.2*   No results for input(s): LIPASE, AMYLASE in the last 168 hours. No results for input(s): AMMONIA in the last 168 hours. Coagulation profile Recent Labs  Lab 05/05/21 0614  INR 1.0    CBC: Recent Labs  Lab 05/02/21 1439 05/04/21 0308 05/05/21 0614  WBC 11.0* 9.9 9.5  NEUTROABS 7.2 5.6  --   HGB 9.4* 8.4* 8.1*  HCT 31.3* 28.4* 26.9*  MCV 80.7 80.2 78.4*  PLT 490* 446* 466*   Cardiac Enzymes: No results for input(s): CKTOTAL, CKMB, CKMBINDEX, TROPONINI in the last 168 hours. BNP: Invalid input(s): POCBNP CBG: Recent Labs  Lab 05/04/21 0737 05/04/21 1137 05/04/21 1639 05/04/21 2118 05/05/21 0725  GLUCAP 220* 368* 341* 198* 206*   D-Dimer Recent Labs    05/02/21 1438  DDIMER 1.05*   Hgb A1c Recent Labs    05/04/21 0308  HGBA1C 8.4*   Lipid Profile No results for input(s): CHOL, HDL, LDLCALC, TRIG, CHOLHDL, LDLDIRECT in the last 72 hours. Thyroid function studies Recent Labs    05/04/21 0308  TSH 164.440*   Anemia work up No results for input(s): VITAMINB12, FOLATE, FERRITIN, TIBC, IRON, RETICCTPCT  in the last 72 hours. Microbiology Recent Results (from the past 240 hour(s))  Resp Panel by RT-PCR (Flu A&B, Covid) Nasopharyngeal Swab     Status: None   Collection Time: 05/02/21  5:15 PM   Specimen: Nasopharyngeal Swab; Nasopharyngeal(NP) swabs in vial transport medium  Result Value Ref Range Status   SARS Coronavirus 2 by RT PCR NEGATIVE NEGATIVE Final    Comment: (NOTE) SARS-CoV-2 target nucleic acids are NOT DETECTED.  The SARS-CoV-2 RNA is generally detectable in upper respiratory specimens during the acute phase of infection. The lowest concentration of SARS-CoV-2 viral copies this assay can detect is 138 copies/mL. A negative result does not preclude SARS-Cov-2 infection and should not be used as the sole basis for treatment or other patient management decisions. A negative result may  occur with  improper specimen collection/handling, submission of specimen other than nasopharyngeal swab, presence of viral mutation(s) within the areas targeted by this assay, and inadequate number of viral copies(<138 copies/mL). A negative result must be combined with clinical observations, patient history, and epidemiological information. The expected result is Negative.  Fact Sheet for Patients:  EntrepreneurPulse.com.au  Fact Sheet for Healthcare Providers:  IncredibleEmployment.be  This test is no t yet approved or cleared by the [place] FDA and  has been authorized for detection and/or diagnosis of SARS-CoV-2 by FDA under an Emergency Use Authorization (EUA). This EUA will remain  in effect (meaning this test can be used) for the duration of the COVID-19 declaration under Section 564(b)(1) of the Act, 21 U.S.C.section 360bbb-3(b)(1), unless the authorization is terminated  or revoked sooner.       Influenza A by PCR NEGATIVE NEGATIVE Final   Influenza B by PCR NEGATIVE NEGATIVE Final    Comment: (NOTE) The Xpert Xpress  SARS-CoV-2/FLU/RSV plus assay is intended as an aid in the diagnosis of influenza from Nasopharyngeal swab specimens and should not be used as a sole basis for treatment. Nasal washings and aspirates are unacceptable for Xpert Xpress SARS-CoV-2/FLU/RSV testing.  Fact Sheet for Patients: EntrepreneurPulse.com.au  Fact Sheet for Healthcare Providers: IncredibleEmployment.be  This test is not yet approved or cleared by the [place] FDA and has been authorized for detection and/or diagnosis of SARS-CoV-2 by FDA under an Emergency Use Authorization (EUA). This EUA will remain in effect (meaning this test can be used) for the duration of the COVID-19 declaration under Section 564(b)(1) of the Act, 21 U.S.C. section 360bbb-3(b)(1), unless the authorization is terminated or revoked.  Performed at [facility], [address], [place], [place] [address]      Discharge Instructions:   Discharge Instructions     Diet - low sodium heart healthy   Complete by: As directed    Discharge instructions   Complete by: As directed    Follow up with your primary care physician in one week. Check thyroid function in 4-6 weeks. Drink warm fluids and stay warm at home.   Increase activity slowly   Complete by: As directed       Allergies as of 05/05/2021       Reactions   Codeine Hives   Sulfonamide Derivatives Hives   Canagliflozin    Penicillins Hives   Pt states she does NOT have allergy to penicillin   Sulfa Antibiotics Other (See Comments)   unknown        Medication List     TAKE these medications    acetaminophen 500 MG tablet Commonly known as: TYLENOL Take 1 tablet (500 mg total) by mouth every 8 (eight) hours as needed for moderate pain or headache.   amLODipine 5 MG tablet Commonly known as: NORVASC Take 5 mg by mouth daily.   aspirin 81 MG chewable tablet Chew 1 tablet (81 mg total) by mouth daily.   atorvastatin 20 MG  tablet Commonly known as: LIPITOR Take 1 tablet (20 mg total) by mouth daily.   CALCIUM-MAGNESIUM-ZINC PO Take 3 tablets by mouth daily.   ciprofloxacin 0.3 % ophthalmic solution Commonly known as: CILOXAN 1 drop 2 (two) times daily.   Co Q-10 100 MG Caps Take 100 mg by mouth daily.   doxepin 10 MG capsule Commonly known as: SINEQUAN Take 10 mg by mouth at bedtime.   DULoxetine 60 MG capsule Commonly known as: CYMBALTA Take 60 mg by  mouth daily.   EPINEPHrine 0.3 mg/0.3 mL Soaj injection Commonly known as: EPI-PEN Inject 0.3 mg into the muscle daily as needed for anaphylaxis.   ferrous sulfate 325 (65 FE) MG tablet Take 325 mg by mouth daily.   fexofenadine 30 MG tablet Commonly known as: ALLEGRA Take 30 mg by mouth daily.   Fish Oil 1000 MG Caps Take 1 capsule by mouth daily.   fluticasone 50 MCG/ACT nasal spray Commonly known as: FLONASE Place 2 sprays into the nose daily.   furosemide 40 MG tablet Commonly known as: LASIX Take 1 tablet (40 mg total) by mouth 3 (three) times a week. Take on Monday/Wednesday/Friday   furosemide 20 MG tablet Commonly known as: LASIX Take 20 mg by mouth daily.   gabapentin 300 MG capsule Commonly known as: NEURONTIN Take 300 mg by mouth at bedtime.   gentamicin 0.3 % ophthalmic solution Commonly known as: GARAMYCIN Place 1 drop into both eyes 3 (three) times daily as needed (red or irritated eyes).   hydrOXYzine 25 MG capsule Commonly known as: VISTARIL Take 50 mg by mouth every 8 (eight) hours as needed for anxiety or itching.   Insulin Glargine-Lixisenatide 100-33 UNT-MCG/ML Sopn Inject 20-25 Units into the skin daily. Pt adjust between 20-25 units   levothyroxine 150 MCG tablet Commonly known as: SYNTHROID Take 1 tablet (150 mcg total) by mouth daily at 6 (six) AM. Start taking on: May 06, 2021 What changed:  medication strength how much to take when to take this   linaclotide 290 MCG Caps capsule Commonly  known as: Linzess Take 1 capsule (290 mcg total) by mouth daily. MUST HAVE OFFICE VISIT FOR ADDITIONAL REFILLS   magnesium oxide 400 MG tablet Commonly known as: MAG-OX Take 400 mg by mouth daily.   meclizine 25 MG tablet Commonly known as: ANTIVERT Take 1 tablet (25 mg total) by mouth 3 (three) times daily as needed for dizziness.   Melatonin 10 MG Tabs Take 10 mg by mouth at bedtime as needed (sleep).   metFORMIN 1000 MG tablet Commonly known as: GLUCOPHAGE Take 1,000 mg by mouth 2 (two) times daily with a meal.   metolazone 5 MG tablet Commonly known as: ZAROXOLYN Take 5 mg by mouth daily.   metoprolol succinate 50 MG 24 hr tablet Commonly known as: TOPROL-XL TAKE 1 TABLET BY MOUTH EVERY DAY   midodrine 10 MG tablet Commonly known as: PROAMATINE Take 10 mg by mouth daily.   montelukast 10 MG tablet Commonly known as: SINGULAIR Take 10 mg by mouth at bedtime.   nitroGLYCERIN 0.4 MG SL tablet Commonly known as: NITROSTAT PLACE 1 TABLET (0.4 MG TOTAL) UNDER THE TONGUE EVERY 5 (FIVE) MINUTES AS NEEDED FOR CHEST PAIN.   NovoLOG FlexPen 100 UNIT/ML FlexPen Generic drug: insulin aspart Inject 0-20 Units into the skin daily. At night , on sliding scale.   nystatin 100000 UNIT/ML suspension Commonly known as: MYCOSTATIN Use as directed 5 mLs in the mouth or throat daily as needed (mouthwash).   ondansetron 4 MG tablet Commonly known as: Zofran Take 1 tablet (4 mg total) by mouth every 8 (eight) hours as needed for nausea or vomiting.   pantoprazole 40 MG tablet Commonly known as: PROTONIX Take 1 tablet (40 mg total) by mouth 2 (two) times daily.   potassium chloride 10 MEQ tablet Commonly known as: KLOR-CON Take 1 tablet (10 mEq total) by mouth daily for 5 days.   ramipril 5 MG capsule Commonly known as: ALTACE Take 1 capsule (  5 mg total) by mouth at bedtime.   traMADol 50 MG tablet Commonly known as: ULTRAM Take 50 mg by mouth daily as needed for moderate  pain.   Vitamin D3 50 MCG (2000 UT) capsule Take 2,000 Units by mouth daily.       ASK your doctor about these medications    cyanocobalamin 1000 MCG/ML injection Commonly known as: (VITAMIN B-12) INJECT 1 ML INTO MUSCLE ONCE A MONTH         Time coordinating discharge: 39 minutes  Signed:  Neno Hohensee  [facility] 05/05/2021, 8:50 AM

## 2021-10-16 ENCOUNTER — Other Ambulatory Visit: Payer: Self-pay | Admitting: Cardiovascular Disease

## 2021-10-16 ENCOUNTER — Other Ambulatory Visit: Payer: Self-pay | Admitting: Physician Assistant

## 2021-10-16 ENCOUNTER — Other Ambulatory Visit: Payer: Self-pay | Admitting: Gastroenterology

## 2021-11-13 ENCOUNTER — Other Ambulatory Visit: Payer: Self-pay | Admitting: Cardiovascular Disease

## 2021-12-28 ENCOUNTER — Emergency Department (HOSPITAL_COMMUNITY)
Admission: EM | Admit: 2021-12-28 | Discharge: 2021-12-28 | Disposition: A | Discharge status: Home / Self Care | Payer: Medicare HMO | Attending: Emergency Medicine | Admitting: Emergency Medicine

## 2021-12-28 ENCOUNTER — Encounter (HOSPITAL_COMMUNITY): Payer: Self-pay | Admitting: *Deleted

## 2021-12-28 DIAGNOSIS — R739 Hyperglycemia, unspecified: Secondary | ICD-10-CM

## 2021-12-28 DIAGNOSIS — Z950 Presence of cardiac pacemaker: Secondary | ICD-10-CM | POA: Diagnosis not present

## 2021-12-28 DIAGNOSIS — I484 Atypical atrial flutter: Secondary | ICD-10-CM | POA: Insufficient documentation

## 2021-12-28 DIAGNOSIS — E876 Hypokalemia: Secondary | ICD-10-CM | POA: Insufficient documentation

## 2021-12-28 DIAGNOSIS — Z79899 Other long term (current) drug therapy: Secondary | ICD-10-CM | POA: Diagnosis not present

## 2021-12-28 DIAGNOSIS — E1165 Type 2 diabetes mellitus with hyperglycemia: Secondary | ICD-10-CM | POA: Insufficient documentation

## 2021-12-28 DIAGNOSIS — Z7982 Long term (current) use of aspirin: Secondary | ICD-10-CM | POA: Insufficient documentation

## 2021-12-28 DIAGNOSIS — Z7984 Long term (current) use of oral hypoglycemic drugs: Secondary | ICD-10-CM | POA: Insufficient documentation

## 2021-12-28 DIAGNOSIS — I10 Essential (primary) hypertension: Secondary | ICD-10-CM | POA: Insufficient documentation

## 2021-12-28 DIAGNOSIS — Z794 Long term (current) use of insulin: Secondary | ICD-10-CM | POA: Insufficient documentation

## 2021-12-28 DIAGNOSIS — D72829 Elevated white blood cell count, unspecified: Secondary | ICD-10-CM | POA: Insufficient documentation

## 2021-12-28 LAB — CBC
HCT: 43 % (ref 36.0–46.0)
Hemoglobin: 13.9 g/dL (ref 12.0–15.0)
MCH: 27.5 pg (ref 26.0–34.0)
MCHC: 32.3 g/dL (ref 30.0–36.0)
MCV: 85 fL (ref 80.0–100.0)
Platelets: 346 10*3/uL (ref 150–400)
RBC: 5.06 MIL/uL (ref 3.87–5.11)
RDW: 13.7 % (ref 11.5–15.5)
WBC: 12.1 10*3/uL — ABNORMAL HIGH (ref 4.0–10.5)
nRBC: 0 % (ref 0.0–0.2)

## 2021-12-28 LAB — URINALYSIS, ROUTINE W REFLEX MICROSCOPIC
Bilirubin Urine: NEGATIVE
Glucose, UA: >=500 mg/dL — AB
Hgb urine dipstick: NEGATIVE
Ketones, ur: NEGATIVE mg/dL
Nitrite: NEGATIVE
Protein, ur: NEGATIVE mg/dL
Specific Gravity, Urine: 1.016 (ref 1.005–1.030)
pH: 6 (ref 5.0–8.0)

## 2021-12-28 LAB — BASIC METABOLIC PANEL
Anion gap: 9 (ref 5–15)
BUN: 14 mg/dL (ref 8–23)
CO2: 28 mmol/L (ref 22–32)
Calcium: 9.3 mg/dL (ref 8.9–10.3)
Chloride: 93 mmol/L — ABNORMAL LOW (ref 98–111)
Creatinine, Ser: 0.75 mg/dL (ref 0.44–1.00)
GFR, Estimated: >60 mL/min (ref >60)
Glucose, Bld: 333 mg/dL — ABNORMAL HIGH (ref 70–99)
Potassium: 3.9 mmol/L (ref 3.5–5.1)
Sodium: 130 mmol/L — ABNORMAL LOW (ref 135–145)

## 2021-12-28 LAB — CBG MONITORING, ED
Glucose-Capillary: 318 mg/dL — ABNORMAL HIGH (ref 70–99)
Glucose-Capillary: 291 mg/dL — ABNORMAL HIGH (ref 70–99)

## 2021-12-28 NOTE — ED Notes (Signed)
Husband, Josph Macho called with update at this time.  ?

## 2021-12-28 NOTE — Discharge Instructions (Addendum)
Monitor your condition carefully and do not hesitate to return here for concerning changes in your condition. ?

## 2021-12-28 NOTE — ED Triage Notes (Signed)
States her blood sugar has been elevated during the night ?

## 2021-12-28 NOTE — ED Provider Notes (Deleted)
?[facility] ?Provider Note ? ? ?CSN: [id number] ?Arrival date & time: 12/28/21  1112 ? ?  ? ?History ? ?Chief Complaint  ?Patient presents with  ? Hyperglycemia  ? ? ?Jeanne Kim is a 81 y.o. female. ? ?HPI ?Patient presents with concern of hyperglycemia.  She notes that she feels generally about the same as usual, but notes over the past few days she has had hyperglycemia.  No apparent new pain, vomiting, fever.  No change in medication, she notes however, that her blood glucose was previously normal. ?  ? ?Home Medications ?Prior to Admission medications   ?Medication Sig Start Date End Date Taking? Authorizing Provider  ?amLODipine (NORVASC) 5 MG tablet Take 5 mg by mouth daily. 09/08/20   [provider]  ?aspirin 81 MG chewable tablet Chew 1 tablet (81 mg total) by mouth daily. 10/13/20   Thurnell Lose, MD  ?atorvastatin (LIPITOR) 20 MG tablet Take 1 tablet (20 mg total) by mouth daily. 10/17/12   Hillary Bow, MD  ?CALCIUM-MAGNESIUM-ZINC PO Take 3 tablets by mouth daily.    [provider]  ?Cholecalciferol (VITAMIN D3) 50 MCG (2000 UT) capsule Take 2,000 Units by mouth daily. 09/27/20   [provider]  ?ciprofloxacin (CILOXAN) 0.3 % ophthalmic solution 1 drop 2 (two) times daily. 04/24/21   [provider]  ?Coenzyme Q10 (CO Q-10) 100 MG CAPS Take 100 mg by mouth daily.    [provider]  ?cyanocobalamin (,VITAMIN B-12,) 1000 MCG/ML injection INJECT 1 ML INTO MUSCLE ONCE A MONTH ?Patient taking differently: Inject 1,000 mcg into the muscle every 30 (thirty) days. 04/27/19   Mauri Pole, MD  ?doxepin (SINEQUAN) 10 MG capsule Take 10 mg by mouth at bedtime. 02/16/19   [provider]  ?DULoxetine (CYMBALTA) 60 MG capsule Take 60 mg by mouth daily.    [provider]  ?EPINEPHrine 0.3 mg/0.3 mL IJ SOAJ injection Inject 0.3 mg into the muscle daily as needed for anaphylaxis. 07/18/09   [provider]   ?ferrous sulfate 325 (65 FE) MG tablet Take 325 mg by mouth daily. 10/13/20   [provider]  ?fexofenadine (ALLEGRA) 30 MG tablet Take 30 mg by mouth daily.    [provider]  ?fluticasone (FLONASE) 50 MCG/ACT nasal spray Place 2 sprays into the nose daily.    [provider]  ?furosemide (LASIX) 20 MG tablet Take 20 mg by mouth daily. 05/01/21   [provider]  ?furosemide (LASIX) 40 MG tablet Take 1 tablet (40 mg total) by mouth 3 (three) times a week. Take on Monday/Wednesday/Friday 07/06/20   Richardson Dopp T, PA-C  ?gabapentin (NEURONTIN) 300 MG capsule Take 300 mg by mouth at bedtime.    [provider]  ?gentamicin (GARAMYCIN) 0.3 % ophthalmic solution Place 1 drop into both eyes 3 (three) times daily as needed (red or irritated eyes). 06/16/20   [provider]  ?hydrOXYzine (VISTARIL) 25 MG capsule Take 50 mg by mouth every 8 (eight) hours as needed for anxiety or itching. 11/04/20   [provider]  ?Insulin Glargine-Lixisenatide 100-33 UNT-MCG/ML SOPN Inject 20-25 Units into the skin daily. Pt adjust between 20-25 units    [provider]  ?levothyroxine (SYNTHROID) 150 MCG tablet Take 1 tablet (150 mcg total) by mouth daily at 6 (six) AM. 05/06/21   Pokhrel, Corrie Mckusick, MD  ?linaclotide (LINZESS) 290 MCG CAPS capsule Take 1 capsule (290 mcg total) by mouth daily. MUST HAVE OFFICE VISIT  FOR ADDITIONAL REFILLS 04/04/21   Mauri Pole, MD  ?magnesium oxide (MAG-OX) 400 MG tablet Take 400 mg by mouth daily.    [provider]  ?meclizine (ANTIVERT) 25 MG tablet Take 1 tablet (25 mg total) by mouth 3 (three) times daily as needed for dizziness. 09/29/20   Thurnell Lose, MD  ?Melatonin 10 MG TABS Take 10 mg by mouth at bedtime as needed (sleep).    [provider]  ?metFORMIN (GLUCOPHAGE) 1000 MG tablet Take 1,000 mg by mouth 2 (two) times daily with a meal.    [provider]  ?metolazone (ZAROXOLYN) 5 MG  tablet Take 5 mg by mouth daily.    [provider]  ?metoprolol succinate (TOPROL-XL) 50 MG 24 hr tablet Take 1 tablet (50 mg total) by mouth daily. Please make overdue appt with Dr. Burt Knack before anymore refills. Thank you 3rd and Final Attempt 10/17/21   Sherren Mocha, MD  ?midodrine (PROAMATINE) 10 MG tablet Take 10 mg by mouth daily. 12/06/20   [provider]  ?montelukast (SINGULAIR) 10 MG tablet Take 10 mg by mouth at bedtime.    [provider]  ?nitroGLYCERIN (NITROSTAT) 0.4 MG SL tablet PLACE 1 TABLET (0.4 MG TOTAL) UNDER THE TONGUE EVERY 5 (FIVE) MINUTES AS NEEDED FOR CHEST PAIN. 12/03/19   Sherren Mocha, MD  ?Cira Servant FLEXPEN 100 UNIT/ML FlexPen Inject 0-20 Units into the skin daily. At night , on sliding scale. 11/16/20   [provider]  ?nystatin (MYCOSTATIN) 100000 UNIT/ML suspension Use as directed 5 mLs in the mouth or throat daily as needed (mouthwash). 09/27/20   [provider]  ?Omega-3 Fatty Acids (FISH OIL) 1000 MG CAPS Take 1 capsule by mouth daily. 07/18/09   [provider]  ?ondansetron (ZOFRAN) 4 MG tablet Take 1 tablet (4 mg total) by mouth every 8 (eight) hours as needed for nausea or vomiting. 09/29/20   Thurnell Lose, MD  ?pantoprazole (PROTONIX) 40 MG tablet Take 1 tablet (40 mg total) by mouth 2 (two) times daily. 08/07/20   Mariel Aloe, MD  ?potassium chloride (KLOR-CON) 10 MEQ tablet Take 1 tablet (10 mEq total) by mouth daily for 5 days. 05/02/21 05/07/21  Loni Beckwith, PA-C  ?ramipril (ALTACE) 5 MG capsule Take 1 capsule (5 mg total) by mouth at bedtime. 01/11/16   Sherren Mocha, MD  ?traMADol (ULTRAM) 50 MG tablet Take 50 mg by mouth daily as needed for moderate pain. 10/25/20   [provider]  ?   ? ?Allergies    ?Codeine, Sulfonamide derivatives, Canagliflozin, Penicillins, and Sulfa antibiotics   ? ?Review of Systems   ?Review of Systems  ?Constitutional:   ?     Per HPI, otherwise negative  ?HENT:     ?     Per HPI, otherwise negative  ?Respiratory:    ?     Per HPI, otherwise negative  ?Cardiovascular:   ?     Per HPI, otherwise negative  ?Gastrointestinal:  Negative for vomiting.  ?Endocrine:  ?     Negative aside from HPI  ?Genitourinary:   ?     Neg aside from HPI   ?Musculoskeletal:   ?     Per HPI, otherwise negative  ?Skin: Negative.   ?Neurological:  Negative for syncope.  ? ?Physical Exam ?Updated Vital Signs ?BP (!) 157/86   Pulse 100   Temp 97.7 ?F (36.5 ?C) (Oral)   Resp 20   Ht '5\' 5"'$  (1.651 m)  Wt 72.6 kg   SpO2 98%   BMI 26.63 kg/m?  ?Physical Exam ?Vitals and nursing note reviewed.  ?Constitutional:   ?   General: She is not in acute distress. ?   Appearance: She is well-developed.  ?HENT:  ?   Head: Normocephalic and atraumatic.  ?Eyes:  ?   Conjunctiva/sclera: Conjunctivae normal.  ?Cardiovascular:  ?   Rate and Rhythm: Normal rate and regular rhythm.  ?Pulmonary:  ?   Effort: Pulmonary effort is normal. No respiratory distress.  ?   Breath sounds: Normal breath sounds. No stridor.  ?Abdominal:  ?   General: There is no distension.  ?Skin: ?   General: Skin is warm and dry.  ?Neurological:  ?   Mental Status: She is alert and oriented to person, place, and time.  ?   Cranial Nerves: No cranial nerve deficit.  ?Psychiatric:     ?   Mood and Affect: Mood normal.  ? ? ?ED Results / Procedures / Treatments   ?Labs ?(all labs ordered are listed, but only abnormal results are displayed) ?Labs Reviewed  ?CBC - Abnormal; Notable for the following components:  ?    Result Value  ? WBC 12.1 (*)   ? All other components within normal limits  ?CBG MONITORING, ED - Abnormal; Notable for the following components:  ? Glucose-Capillary 318 (*)   ? All other components within normal limits  ?BASIC METABOLIC PANEL  ?URINALYSIS, ROUTINE W REFLEX MICROSCOPIC  ?CBG MONITORING, ED  ? ? ?EKG ?None ? ?Radiology ?No results found. ? ?Procedures ?.Sedation ? ?Date/Time: 12/28/2021 4:03 PM ?Performed by: Carmin Muskrat, MD ?Authorized by: Carmin Muskrat, MD  ? ?Consent:  ?  Consent obtained:  Verbal ?  Consent given by:  Patient ?  Risks discussed:  Dysrhythmia, inadequate sedation, nausea and vomiting ?  Alternatives disc

## 2021-12-28 NOTE — ED Notes (Signed)
Patient verbalized understanding of discharge instructions. No sign pad available.  ?

## 2021-12-28 NOTE — ED Provider Notes (Signed)
Care transferred to me.  Patient has no dysuria and has few white blood cells in the urine but only rare bacteria.  Given no specific UTI symptoms I think is reasonable to send for culture and if positive treat but if not then I do not think this is really a UTI.  She feels well enough to go home.  She did briefly mention to me that she had some blood in her stools the day before and has followed with gastroenterology in the past.  However she is asking that I do not evaluate for this and she just wants to follow-up with gastroenterology.  She has not had any today.  Her hemoglobin is normal.  We discussed return precautions but I think she is stable for discharge. ?  ?Sherwood Gambler, MD ?12/28/21 1739 ? ?

## 2021-12-28 NOTE — ED Notes (Signed)
Patient ambulated to restroom with assistance.

## 2021-12-28 NOTE — ED Provider Notes (Signed)
?[facility] ?Provider Note ? ? ?CSN: [id number] ?Arrival date & time: 12/28/21  1112 ? ?  ? ?History ? ?Chief Complaint  ?Patient presents with  ? Hyperglycemia  ? ? ?Jeanne Kim is a 81 y.o. female. ? ?HPI ?Patient presents with concern of hyperglycemia.  She notes that she has been feeling generally well, over the past few days has noticed her blood glucose has been greater than 300 in spite of taking all medication as directed.  She denies cough, dyspnea, chest pain, fever, dysuria. ?  ? ?Home Medications ?Prior to Admission medications   ?Medication Sig Start Date End Date Taking? Authorizing Provider  ?amLODipine (NORVASC) 5 MG tablet Take 5 mg by mouth daily. 09/08/20   [provider]  ?aspirin 81 MG chewable tablet Chew 1 tablet (81 mg total) by mouth daily. 10/13/20   Thurnell Lose, MD  ?atorvastatin (LIPITOR) 20 MG tablet Take 1 tablet (20 mg total) by mouth daily. 10/17/12   Hillary Bow, MD  ?CALCIUM-MAGNESIUM-ZINC PO Take 3 tablets by mouth daily.    [provider]  ?Cholecalciferol (VITAMIN D3) 50 MCG (2000 UT) capsule Take 2,000 Units by mouth daily. 09/27/20   [provider]  ?ciprofloxacin (CILOXAN) 0.3 % ophthalmic solution 1 drop 2 (two) times daily. 04/24/21   [provider]  ?Coenzyme Q10 (CO Q-10) 100 MG CAPS Take 100 mg by mouth daily.    [provider]  ?cyanocobalamin (,VITAMIN B-12,) 1000 MCG/ML injection INJECT 1 ML INTO MUSCLE ONCE A MONTH ?Patient taking differently: Inject 1,000 mcg into the muscle every 30 (thirty) days. 04/27/19   Mauri Pole, MD  ?doxepin (SINEQUAN) 10 MG capsule Take 10 mg by mouth at bedtime. 02/16/19   [provider]  ?DULoxetine (CYMBALTA) 60 MG capsule Take 60 mg by mouth daily.    [provider]  ?EPINEPHrine 0.3 mg/0.3 mL IJ SOAJ injection Inject 0.3 mg into the muscle daily as needed for anaphylaxis. 07/18/09   [provider]  ?ferrous sulfate 325  (65 FE) MG tablet Take 325 mg by mouth daily. 10/13/20   [provider]  ?fexofenadine (ALLEGRA) 30 MG tablet Take 30 mg by mouth daily.    [provider]  ?fluticasone (FLONASE) 50 MCG/ACT nasal spray Place 2 sprays into the nose daily.    [provider]  ?furosemide (LASIX) 20 MG tablet Take 20 mg by mouth daily. 05/01/21   [provider]  ?furosemide (LASIX) 40 MG tablet Take 1 tablet (40 mg total) by mouth 3 (three) times a week. Take on Monday/Wednesday/Friday 07/06/20   Richardson Dopp T, PA-C  ?gabapentin (NEURONTIN) 300 MG capsule Take 300 mg by mouth at bedtime.    [provider]  ?gentamicin (GARAMYCIN) 0.3 % ophthalmic solution Place 1 drop into both eyes 3 (three) times daily as needed (red or irritated eyes). 06/16/20   [provider]  ?hydrOXYzine (VISTARIL) 25 MG capsule Take 50 mg by mouth every 8 (eight) hours as needed for anxiety or itching. 11/04/20   [provider]  ?Insulin Glargine-Lixisenatide 100-33 UNT-MCG/ML SOPN Inject 20-25 Units into the skin daily. Pt adjust between 20-25 units    [provider]  ?levothyroxine (SYNTHROID) 150 MCG tablet Take 1 tablet (150 mcg total) by mouth daily at 6 (six) AM. 05/06/21   Pokhrel, Corrie Mckusick, MD  ?linaclotide (LINZESS) 290 MCG CAPS capsule Take 1 capsule (290 mcg total) by mouth daily. MUST HAVE OFFICE VISIT FOR ADDITIONAL REFILLS  04/04/21   Mauri Pole, MD  ?magnesium oxide (MAG-OX) 400 MG tablet Take 400 mg by mouth daily.    [provider]  ?meclizine (ANTIVERT) 25 MG tablet Take 1 tablet (25 mg total) by mouth 3 (three) times daily as needed for dizziness. 09/29/20   Thurnell Lose, MD  ?Melatonin 10 MG TABS Take 10 mg by mouth at bedtime as needed (sleep).    [provider]  ?metFORMIN (GLUCOPHAGE) 1000 MG tablet Take 1,000 mg by mouth 2 (two) times daily with a meal.    [provider]  ?metolazone (ZAROXOLYN) 5 MG tablet Take 5 mg by mouth  daily.    [provider]  ?metoprolol succinate (TOPROL-XL) 50 MG 24 hr tablet Take 1 tablet (50 mg total) by mouth daily. Please make overdue appt with Dr. Burt Knack before anymore refills. Thank you 3rd and Final Attempt 10/17/21   Sherren Mocha, MD  ?midodrine (PROAMATINE) 10 MG tablet Take 10 mg by mouth daily. 12/06/20   [provider]  ?montelukast (SINGULAIR) 10 MG tablet Take 10 mg by mouth at bedtime.    [provider]  ?nitroGLYCERIN (NITROSTAT) 0.4 MG SL tablet PLACE 1 TABLET (0.4 MG TOTAL) UNDER THE TONGUE EVERY 5 (FIVE) MINUTES AS NEEDED FOR CHEST PAIN. 12/03/19   Sherren Mocha, MD  ?Cira Servant FLEXPEN 100 UNIT/ML FlexPen Inject 0-20 Units into the skin daily. At night , on sliding scale. 11/16/20   [provider]  ?nystatin (MYCOSTATIN) 100000 UNIT/ML suspension Use as directed 5 mLs in the mouth or throat daily as needed (mouthwash). 09/27/20   [provider]  ?Omega-3 Fatty Acids (FISH OIL) 1000 MG CAPS Take 1 capsule by mouth daily. 07/18/09   [provider]  ?ondansetron (ZOFRAN) 4 MG tablet Take 1 tablet (4 mg total) by mouth every 8 (eight) hours as needed for nausea or vomiting. 09/29/20   Thurnell Lose, MD  ?pantoprazole (PROTONIX) 40 MG tablet Take 1 tablet (40 mg total) by mouth 2 (two) times daily. 08/07/20   Mariel Aloe, MD  ?potassium chloride (KLOR-CON) 10 MEQ tablet Take 1 tablet (10 mEq total) by mouth daily for 5 days. 05/02/21 05/07/21  Loni Beckwith, PA-C  ?ramipril (ALTACE) 5 MG capsule Take 1 capsule (5 mg total) by mouth at bedtime. 01/11/16   Sherren Mocha, MD  ?traMADol (ULTRAM) 50 MG tablet Take 50 mg by mouth daily as needed for moderate pain. 10/25/20   [provider]  ?   ? ?Allergies    ?Codeine, Sulfonamide derivatives, Canagliflozin, Penicillins, and Sulfa antibiotics   ? ?Review of Systems   ?Review of Systems  ?Constitutional:   ?     Per HPI, otherwise negative  ?HENT:    ?     Per HPI, otherwise  negative  ?Respiratory:    ?     Per HPI, otherwise negative  ?Cardiovascular:   ?     Per HPI, otherwise negative  ?Gastrointestinal:  Negative for vomiting.  ?Endocrine:  ?     Negative aside from HPI  ?Genitourinary:   ?     Neg aside from HPI   ?Musculoskeletal:   ?     Per HPI, otherwise negative  ?Skin: Negative.   ?Neurological:  Negative for syncope.  ? ?Physical Exam ?Updated Vital Signs ?BP 126/64 (BP Location: Left Arm)   Pulse 73   Temp 97.7 ?F (36.5 ?C) (Oral)   Resp 18   Ht '5\' 5"'$  (1.651 m)  Wt 72.6 kg   SpO2 100%   BMI 26.63 kg/m?  ?Physical Exam ?Vitals and nursing note reviewed.  ?Constitutional:   ?   General: She is not in acute distress. ?   Appearance: She is well-developed.  ?HENT:  ?   Head: Normocephalic and atraumatic.  ?Eyes:  ?   Conjunctiva/sclera: Conjunctivae normal.  ?Cardiovascular:  ?   Rate and Rhythm: Normal rate and regular rhythm.  ?Pulmonary:  ?   Effort: Pulmonary effort is normal. No respiratory distress.  ?   Breath sounds: Normal breath sounds. No stridor.  ?Abdominal:  ?   General: There is no distension.  ?Skin: ?   General: Skin is warm and dry.  ?Neurological:  ?   Mental Status: She is alert and oriented to person, place, and time.  ?   Cranial Nerves: No cranial nerve deficit.  ?Psychiatric:     ?   Mood and Affect: Mood normal.  ? ? ?ED Results / Procedures / Treatments   ?Labs ?(all labs ordered are listed, but only abnormal results are displayed) ?Labs Reviewed  ?BASIC METABOLIC PANEL - Abnormal; Notable for the following components:  ?    Result Value  ? Sodium 130 (*)   ? Chloride 93 (*)   ? Glucose, Bld 333 (*)   ? All other components within normal limits  ?CBC - Abnormal; Notable for the following components:  ? WBC 12.1 (*)   ? All other components within normal limits  ?CBG MONITORING, ED - Abnormal; Notable for the following components:  ? Glucose-Capillary 318 (*)   ? All other components within normal limits  ?CBG MONITORING, ED - Abnormal; Notable  for the following components:  ? Glucose-Capillary 291 (*)   ? All other components within normal limits  ?URINALYSIS, ROUTINE W REFLEX MICROSCOPIC  ?CBG MONITORING, ED  ? ? ?EKG ?None ? ?Radiology ?No resul

## 2021-12-28 NOTE — ED Notes (Signed)
Lab called at this time to add urine culture to urine sample.  ?

## 2021-12-30 LAB — URINE CULTURE

## 2022-04-01 ENCOUNTER — Encounter: Payer: Self-pay | Admitting: Gastroenterology

## 2022-05-10 ENCOUNTER — Encounter (HOSPITAL_COMMUNITY): Payer: Self-pay

## 2022-05-10 ENCOUNTER — Emergency Department (HOSPITAL_COMMUNITY): Payer: Medicare HMO

## 2022-05-10 ENCOUNTER — Other Ambulatory Visit: Payer: Self-pay

## 2022-05-10 ENCOUNTER — Emergency Department (HOSPITAL_COMMUNITY)
Admission: EM | Admit: 2022-05-10 | Discharge: 2022-05-11 | Disposition: A | Discharge status: Home / Self Care | Payer: Medicare HMO | Attending: Emergency Medicine | Admitting: Emergency Medicine

## 2022-05-10 DIAGNOSIS — Z79899 Other long term (current) drug therapy: Secondary | ICD-10-CM | POA: Diagnosis not present

## 2022-05-10 DIAGNOSIS — R0602 Shortness of breath: Secondary | ICD-10-CM | POA: Insufficient documentation

## 2022-05-10 DIAGNOSIS — R531 Weakness: Secondary | ICD-10-CM | POA: Insufficient documentation

## 2022-05-10 DIAGNOSIS — Z794 Long term (current) use of insulin: Secondary | ICD-10-CM | POA: Diagnosis not present

## 2022-05-10 DIAGNOSIS — R42 Dizziness and giddiness: Secondary | ICD-10-CM | POA: Insufficient documentation

## 2022-05-10 DIAGNOSIS — Z7982 Long term (current) use of aspirin: Secondary | ICD-10-CM | POA: Insufficient documentation

## 2022-05-10 NOTE — ED Notes (Signed)
Called phlebotomist to inquire if blood has been drawn yet- reports she is upstairs and will be down to draw blood in a few minutes.

## 2022-05-10 NOTE — ED Triage Notes (Signed)
Pt BIB [facility] c/o weakness X 1 year that has gotten worse the last few weeks. Family has concerns for UTI. Unwitnessed fall yesterday, may have hit head. Pt feels unsteady, dizzy, new confusion to time. Pt ambulated to bed from stretcher.

## 2022-05-10 NOTE — ED Notes (Signed)
Pt oriented to location, does not know what month or year this is.

## 2022-05-11 LAB — CBC WITH DIFFERENTIAL/PLATELET
Abs Immature Granulocytes: 0.06 10*3/uL (ref 0.00–0.07)
Basophils Absolute: 0.1 10*3/uL (ref 0.0–0.1)
Basophils Relative: 0 %
Eosinophils Absolute: 0 10*3/uL (ref 0.0–0.5)
Eosinophils Relative: 0 %
HCT: 37.9 % (ref 36.0–46.0)
Hemoglobin: 11.5 g/dL — ABNORMAL LOW (ref 12.0–15.0)
Immature Granulocytes: 1 %
Lymphocytes Relative: 6 %
Lymphs Abs: 0.7 10*3/uL (ref 0.7–4.0)
MCH: 24.4 pg — ABNORMAL LOW (ref 26.0–34.0)
MCHC: 30.3 g/dL (ref 30.0–36.0)
MCV: 80.3 fL (ref 80.0–100.0)
Monocytes Absolute: 1 10*3/uL (ref 0.1–1.0)
Monocytes Relative: 9 %
Neutro Abs: 9.4 10*3/uL — ABNORMAL HIGH (ref 1.7–7.7)
Neutrophils Relative %: 84 %
Platelets: 299 10*3/uL (ref 150–400)
RBC: 4.72 MIL/uL (ref 3.87–5.11)
RDW: 15 % (ref 11.5–15.5)
WBC: 11.2 10*3/uL — ABNORMAL HIGH (ref 4.0–10.5)
nRBC: 0 % (ref 0.0–0.2)

## 2022-05-11 LAB — COMPREHENSIVE METABOLIC PANEL
ALT: 16 U/L (ref 0–44)
AST: 19 U/L (ref 15–41)
Albumin: 3.7 g/dL (ref 3.5–5.0)
Alkaline Phosphatase: 72 U/L (ref 38–126)
Anion gap: 11 (ref 5–15)
BUN: 14 mg/dL (ref 8–23)
CO2: 23 mmol/L (ref 22–32)
Calcium: 8.2 mg/dL — ABNORMAL LOW (ref 8.9–10.3)
Chloride: 96 mmol/L — ABNORMAL LOW (ref 98–111)
Creatinine, Ser: 0.76 mg/dL (ref 0.44–1.00)
GFR, Estimated: >60 mL/min (ref >60)
Glucose, Bld: 215 mg/dL — ABNORMAL HIGH (ref 70–99)
Potassium: 3.9 mmol/L (ref 3.5–5.1)
Sodium: 130 mmol/L — ABNORMAL LOW (ref 135–145)
Total Bilirubin: 1.5 mg/dL — ABNORMAL HIGH (ref 0.3–1.2)
Total Protein: 7 g/dL (ref 6.5–8.1)

## 2022-05-11 LAB — RAPID URINE DRUG SCREEN, HOSP PERFORMED
Amphetamines: NOT DETECTED
Barbiturates: NOT DETECTED
Benzodiazepines: NOT DETECTED
Cocaine: NOT DETECTED
Opiates: NOT DETECTED
Tetrahydrocannabinol: NOT DETECTED

## 2022-05-11 LAB — URINALYSIS, ROUTINE W REFLEX MICROSCOPIC
Bilirubin Urine: NEGATIVE
Glucose, UA: >=500 mg/dL — AB
Hgb urine dipstick: NEGATIVE
Ketones, ur: NEGATIVE mg/dL
Leukocytes,Ua: NEGATIVE
Nitrite: NEGATIVE
Protein, ur: NEGATIVE mg/dL
Specific Gravity, Urine: 1.027 (ref 1.005–1.030)
pH: 6 (ref 5.0–8.0)

## 2022-05-11 LAB — BRAIN NATRIURETIC PEPTIDE: B Natriuretic Peptide: 83 pg/mL (ref 0.0–100.0)

## 2022-05-11 LAB — AMMONIA: Ammonia: 11 umol/L (ref 9–35)

## 2022-05-11 LAB — TROPONIN I (HIGH SENSITIVITY): Troponin I (High Sensitivity): 3 ng/L (ref <18)

## 2022-05-11 LAB — ETHANOL: Alcohol, Ethyl (B): <10 mg/dL (ref <10)

## 2022-05-11 NOTE — ED Provider Notes (Signed)
[facility] EMERGENCY DEPARTMENT Provider Note   CSN: [id number] Arrival date & time: 05/10/22  2247     History  Chief Complaint  Patient presents with   Weakness    Jeanne Kim is a 81 y.o. female.  Patient comes to the ER, accompanied by her husband, with complaints of generalized weakness.  She has been experiencing the symptoms over a year but it has worsened recently.  She did have a fall yesterday, thinks she hit her head but does not have any obvious injuries or specific complaints.  She is unsteady at times and has some dizziness.  Family has noticed some confusion of late as well.       Home Medications Prior to Admission medications   Medication Sig Start Date End Date Taking? Authorizing Provider  amLODipine (NORVASC) 5 MG tablet Take 5 mg by mouth daily. 09/08/20   [provider]  aspirin 81 MG chewable tablet Chew 1 tablet (81 mg total) by mouth daily. 10/13/20   Thurnell Lose, MD  atorvastatin (LIPITOR) 20 MG tablet Take 1 tablet (20 mg total) by mouth daily. 10/17/12   Hillary Bow, MD  CALCIUM-MAGNESIUM-ZINC PO Take 3 tablets by mouth daily.    [provider]  Cholecalciferol (VITAMIN D3) 50 MCG (2000 UT) capsule Take 2,000 Units by mouth daily. 09/27/20   [provider]  ciprofloxacin (CILOXAN) 0.3 % ophthalmic solution 1 drop 2 (two) times daily. 04/24/21   [provider]  Coenzyme Q10 (CO Q-10) 100 MG CAPS Take 100 mg by mouth daily.    [provider]  cyanocobalamin (,VITAMIN B-12,) 1000 MCG/ML injection INJECT 1 ML INTO MUSCLE ONCE A MONTH Patient taking differently: Inject 1,000 mcg into the muscle every 30 (thirty) days. 04/27/19   Mauri Pole, MD  doxepin (SINEQUAN) 10 MG capsule Take 10 mg by mouth at bedtime. 02/16/19   [provider]  DULoxetine (CYMBALTA) 60 MG capsule Take 60 mg by mouth daily.    [provider]  EPINEPHrine 0.3 mg/0.3 mL IJ SOAJ injection Inject 0.3 mg  into the muscle daily as needed for anaphylaxis. 07/18/09   [provider]  ferrous sulfate 325 (65 FE) MG tablet Take 325 mg by mouth daily. 10/13/20   [provider]  fexofenadine (ALLEGRA) 30 MG tablet Take 30 mg by mouth daily.    [provider]  fluticasone (FLONASE) 50 MCG/ACT nasal spray Place 2 sprays into the nose daily.    [provider]  furosemide (LASIX) 20 MG tablet Take 20 mg by mouth daily. 05/01/21   [provider]  furosemide (LASIX) 40 MG tablet Take 1 tablet (40 mg total) by mouth 3 (three) times a week. Take on Monday/Wednesday/Friday 07/06/20   Richardson Dopp T, PA-C  gabapentin (NEURONTIN) 300 MG capsule Take 300 mg by mouth at bedtime.    [provider]  gentamicin (GARAMYCIN) 0.3 % ophthalmic solution Place 1 drop into both eyes 3 (three) times daily as needed (red or irritated eyes). 06/16/20   [provider]  hydrOXYzine (VISTARIL) 25 MG capsule Take 50 mg by mouth every 8 (eight) hours as needed for anxiety or itching. 11/04/20   [provider]  Insulin Glargine-Lixisenatide 100-33 UNT-MCG/ML SOPN Inject 20-25 Units into the skin daily. Pt adjust between 20-25 units    [provider]  levothyroxine (SYNTHROID) 150 MCG tablet Take 1 tablet (150 mcg total) by mouth daily at 6 (six) AM. 05/06/21   Pokhrel,  Laxman, MD  linaclotide (LINZESS) 290 MCG CAPS capsule Take 1 capsule (290 mcg total) by mouth daily. MUST HAVE OFFICE VISIT FOR ADDITIONAL REFILLS 04/04/21   Mauri Pole, MD  magnesium oxide (MAG-OX) 400 MG tablet Take 400 mg by mouth daily.    [provider]  meclizine (ANTIVERT) 25 MG tablet Take 1 tablet (25 mg total) by mouth 3 (three) times daily as needed for dizziness. 09/29/20   Thurnell Lose, MD  Melatonin 10 MG TABS Take 10 mg by mouth at bedtime as needed (sleep).    [provider]  metFORMIN (GLUCOPHAGE) 1000 MG tablet Take 1,000 mg by mouth 2 (two)  times daily with a meal.    [provider]  metolazone (ZAROXOLYN) 5 MG tablet Take 5 mg by mouth daily.    [provider]  metoprolol succinate (TOPROL-XL) 50 MG 24 hr tablet Take 1 tablet (50 mg total) by mouth daily. Please make overdue appt with Dr. Burt Knack before anymore refills. Thank you 3rd and Final Attempt 10/17/21   Sherren Mocha, MD  midodrine (PROAMATINE) 10 MG tablet Take 10 mg by mouth daily. 12/06/20   [provider]  montelukast (SINGULAIR) 10 MG tablet Take 10 mg by mouth at bedtime.    [provider]  nitroGLYCERIN (NITROSTAT) 0.4 MG SL tablet PLACE 1 TABLET (0.4 MG TOTAL) UNDER THE TONGUE EVERY 5 (FIVE) MINUTES AS NEEDED FOR CHEST PAIN. 12/03/19   Sherren Mocha, MD  NOVOLOG FLEXPEN 100 UNIT/ML FlexPen Inject 0-20 Units into the skin daily. At night , on sliding scale. 11/16/20   [provider]  nystatin (MYCOSTATIN) 100000 UNIT/ML suspension Use as directed 5 mLs in the mouth or throat daily as needed (mouthwash). 09/27/20   [provider]  Omega-3 Fatty Acids (FISH OIL) 1000 MG CAPS Take 1 capsule by mouth daily. 07/18/09   [provider]  ondansetron (ZOFRAN) 4 MG tablet Take 1 tablet (4 mg total) by mouth every 8 (eight) hours as needed for nausea or vomiting. 09/29/20   Thurnell Lose, MD  pantoprazole (PROTONIX) 40 MG tablet Take 1 tablet (40 mg total) by mouth 2 (two) times daily. 08/07/20   Mariel Aloe, MD  potassium chloride (KLOR-CON) 10 MEQ tablet Take 1 tablet (10 mEq total) by mouth daily for 5 days. 05/02/21 05/07/21  Loni Beckwith, PA-C  ramipril (ALTACE) 5 MG capsule Take 1 capsule (5 mg total) by mouth at bedtime. 01/11/16   Sherren Mocha, MD  traMADol (ULTRAM) 50 MG tablet Take 50 mg by mouth daily as needed for moderate pain. 10/25/20   [provider]      Allergies    Codeine, Sulfonamide derivatives, Canagliflozin, Penicillins, and Sulfa antibiotics    Review of Systems    Review of Systems  Physical Exam Updated Vital Signs BP 116/69   Pulse 74   Temp 98.1 F (36.7 C) (Oral)   Resp 19   Ht '5\' 5"'$  (1.651 m)   Wt 72.6 kg   SpO2 97%   BMI 26.63 kg/m  Physical Exam Vitals and nursing note reviewed.  Constitutional:      General: She is not in acute distress.    Appearance: She is well-developed.  HENT:     Head: Normocephalic and atraumatic.     Mouth/Throat:     Mouth: Mucous membranes are moist.  Eyes:     General: Vision grossly intact. Gaze aligned appropriately.     Extraocular Movements: Extraocular movements intact.  Conjunctiva/sclera: Conjunctivae normal.  Cardiovascular:     Rate and Rhythm: Normal rate and regular rhythm.     Pulses: Normal pulses.     Heart sounds: Normal heart sounds, S1 normal and S2 normal. No murmur heard.    No friction rub. No gallop.  Pulmonary:     Effort: Pulmonary effort is normal. No respiratory distress.     Breath sounds: Normal breath sounds.  Abdominal:     General: Bowel sounds are normal.     Palpations: Abdomen is soft.     Tenderness: There is no abdominal tenderness. There is no guarding or rebound.     Hernia: No hernia is present.  Musculoskeletal:        General: No swelling.     Cervical back: Full passive range of motion without pain, normal range of motion and neck supple. No spinous process tenderness or muscular tenderness. Normal range of motion.     Right lower leg: No edema.     Left lower leg: No edema.  Skin:    General: Skin is warm and dry.     Capillary Refill: Capillary refill takes less than 2 seconds.     Findings: No ecchymosis, erythema, rash or wound.  Neurological:     General: No focal deficit present.     Mental Status: She is alert and oriented to person, place, and time.     GCS: GCS eye subscore is 4. GCS verbal subscore is 5. GCS motor subscore is 6.     Cranial Nerves: Cranial nerves 2-12 are intact.     Sensory: Sensation is intact.     Motor: Motor  function is intact.     Coordination: Coordination is intact.  Psychiatric:        Attention and Perception: Attention normal.        Mood and Affect: Mood normal.        Speech: Speech normal.        Behavior: Behavior normal.     ED Results / Procedures / Treatments   Labs (all labs ordered are listed, but only abnormal results are displayed) Labs Reviewed  CBC WITH DIFFERENTIAL/PLATELET - Abnormal; Notable for the following components:      Result Value   WBC 11.2 (*)    Hemoglobin 11.5 (*)    MCH 24.4 (*)    Neutro Abs 9.4 (*)    All other components within normal limits  COMPREHENSIVE METABOLIC PANEL - Abnormal; Notable for the following components:   Sodium 130 (*)    Chloride 96 (*)    Glucose, Bld 215 (*)    Calcium 8.2 (*)    Total Bilirubin 1.5 (*)    All other components within normal limits  URINALYSIS, ROUTINE W REFLEX MICROSCOPIC - Abnormal; Notable for the following components:   Glucose, UA >=500 (*)    Bacteria, UA MANY (*)    All other components within normal limits  BRAIN NATRIURETIC PEPTIDE  RAPID URINE DRUG SCREEN, HOSP PERFORMED  AMMONIA  ETHANOL  TROPONIN I (HIGH SENSITIVITY)  TROPONIN I (HIGH SENSITIVITY)    EKG EKG Interpretation  Date/Time:  Thursday May 10 2022 23:38:47 EDT Ventricular Rate:  71 PR Interval:  125 QRS Duration: 78 QT Interval:  376 QTC Calculation: 409 R Axis:   -20 Text Interpretation: Sinus rhythm LVH with secondary repolarization abnormality Inferior infarct, old Anterior infarct, old Confirmed by Orpah Greek [phone]) on 05/10/2022 11:41:12 PM  Radiology DG Chest [facility] [address]  Result Date: 05/10/2022 CLINICAL DATA:  Weakness and shortness of breath EXAM: PORTABLE CHEST 1 VIEW COMPARISON:  05/02/2021 FINDINGS: Shallow inspiration. Heart size and pulmonary vascularity are normal for technique. Lungs are clear. No pleural effusions. No pneumothorax. Mediastinal contours appear intact. Probable esophageal  hiatal hernia behind the heart. Postoperative changes in the cervical spine. Degenerative changes in the left upper quadrant. Degenerative changes in the shoulders and spine. IMPRESSION: Shallow inspiration.  No evidence of active pulmonary disease. Electronically Signed   By: Lucienne Capers M.D.   On: 05/10/2022 23:38   CT HEAD WO CONTRAST (5MM)  Result Date: 05/10/2022 CLINICAL DATA:  Mental status change of unknown cause. Weakness over 1 year, worse in the last few weeks. Unwitnessed fall yesterday. Unsteadiness, dizziness, and confusion. EXAM: CT HEAD WITHOUT CONTRAST TECHNIQUE: Contiguous axial images were obtained from the base of the skull through the vertex without intravenous contrast. RADIATION DOSE REDUCTION: This exam was performed according to the departmental dose-optimization program which includes automated exposure control, adjustment of the mA and/or kV according to patient size and/or use of iterative reconstruction technique. COMPARISON:  09/29/2020 FINDINGS: Brain: Diffuse cerebral atrophy. Ventricular dilatation could be due to central atrophy or normal pressure hydrocephalus. No change since prior study. Low-attenuation changes in the deep white matter consistent with small vessel ischemia. No abnormal extra-axial fluid collections. No mass effect or midline shift. Gray-white matter junctions are distinct. Basal cisterns are not effaced. No acute intracranial hemorrhage. Vascular: No hyperdense vessel or unexpected calcification. Skull: Normal. Negative for fracture or focal lesion. Sinuses/Orbits: Paranasal sinuses and mastoid air cells are clear. Other: None. IMPRESSION: No acute intracranial abnormalities. Chronic atrophy and small vessel ischemic changes. Ventricular dilatation, likely central atrophy but could indicate normal pressure hydrocephalus in the appropriate clinical setting. No change since prior study. Electronically Signed   By: Lucienne Capers M.D.   On: 05/10/2022  23:28    Procedures Procedures    Medications Ordered in ED Medications - No data to display  ED Course/ Medical Decision Making/ A&P                           Medical Decision Making Amount and/or Complexity of Data Reviewed Independent Historian: spouse Labs: ordered. Decision-making details documented in ED Course. Radiology: ordered and independent interpretation performed. Decision-making details documented in ED Course. ECG/medicine tests: ordered and independent interpretation performed. Decision-making details documented in ED Course.   Patient in no distress at arrival.  She is able to answer questions appropriately.  No focal findings on exam.  CT head performed because of confusion as well as recent fall.  Findings consistent with severe atrophy, cannot rule out normal pressure hydrocephalus.  Remainder of work-up is unremarkable.  Patient adamant that she wants to be discharged at this time.  I do not find anything that would require hospitalization, will refer to neurology for further evaluation of CT findings as well as progressive confusion.  Suspect that she has some undiagnosed dementia.        Final Clinical Impression(s) / ED Diagnoses Final diagnoses:  Weakness    Rx / DC Orders ED Discharge Orders     None         Markian Glockner, Gwenyth Allegra, MD 05/11/22 [phone]

## 2022-05-11 NOTE — ED Notes (Signed)
Pt to bathroom with standby assist from two staff members.

## 2022-05-11 NOTE — ED Notes (Signed)
Pt has made several attempts to crawl out of bed, says "she has got to get out of here" pt redirected the first time- 2nd time, this nurse called spouse who says he is sitting and waiting in lobby, requested that he come back to room with his spouse and wait as she is attempting to get out of bed and leave. He agreed and is at bedside now.

## 2022-05-15 ENCOUNTER — Emergency Department (HOSPITAL_COMMUNITY): Payer: Medicare HMO

## 2022-05-15 ENCOUNTER — Inpatient Hospital Stay (HOSPITAL_COMMUNITY)
Admission: EM | Admit: 2022-05-15 | Discharge: 2022-05-22 | DRG: 314 | Disposition: A | Discharge status: Skilled Nursing Facility | Payer: Medicare HMO | Attending: Family Medicine | Admitting: Family Medicine

## 2022-05-15 ENCOUNTER — Encounter (HOSPITAL_COMMUNITY): Payer: Self-pay

## 2022-05-15 ENCOUNTER — Other Ambulatory Visit: Payer: Self-pay

## 2022-05-15 DIAGNOSIS — E785 Hyperlipidemia, unspecified: Secondary | ICD-10-CM | POA: Diagnosis present

## 2022-05-15 DIAGNOSIS — Z885 Allergy status to narcotic agent status: Secondary | ICD-10-CM

## 2022-05-15 DIAGNOSIS — Z880 Allergy status to penicillin: Secondary | ICD-10-CM

## 2022-05-15 DIAGNOSIS — Z7984 Long term (current) use of oral hypoglycemic drugs: Secondary | ICD-10-CM

## 2022-05-15 DIAGNOSIS — Z888 Allergy status to other drugs, medicaments and biological substances status: Secondary | ICD-10-CM

## 2022-05-15 DIAGNOSIS — R4189 Other symptoms and signs involving cognitive functions and awareness: Secondary | ICD-10-CM | POA: Diagnosis present

## 2022-05-15 DIAGNOSIS — Z800 Family history of malignant neoplasm of digestive organs: Secondary | ICD-10-CM

## 2022-05-15 DIAGNOSIS — Z8041 Family history of malignant neoplasm of ovary: Secondary | ICD-10-CM

## 2022-05-15 DIAGNOSIS — I2510 Atherosclerotic heart disease of native coronary artery without angina pectoris: Secondary | ICD-10-CM | POA: Diagnosis present

## 2022-05-15 DIAGNOSIS — E7800 Pure hypercholesterolemia, unspecified: Secondary | ICD-10-CM | POA: Diagnosis present

## 2022-05-15 DIAGNOSIS — Z8249 Family history of ischemic heart disease and other diseases of the circulatory system: Secondary | ICD-10-CM

## 2022-05-15 DIAGNOSIS — A419 Sepsis, unspecified organism: Secondary | ICD-10-CM

## 2022-05-15 DIAGNOSIS — I6522 Occlusion and stenosis of left carotid artery: Secondary | ICD-10-CM | POA: Diagnosis present

## 2022-05-15 DIAGNOSIS — D72823 Leukemoid reaction: Secondary | ICD-10-CM

## 2022-05-15 DIAGNOSIS — R7989 Other specified abnormal findings of blood chemistry: Secondary | ICD-10-CM | POA: Diagnosis present

## 2022-05-15 DIAGNOSIS — R001 Bradycardia, unspecified: Secondary | ICD-10-CM | POA: Diagnosis present

## 2022-05-15 DIAGNOSIS — T501X6A Underdosing of loop [high-ceiling] diuretics, initial encounter: Secondary | ICD-10-CM | POA: Diagnosis present

## 2022-05-15 DIAGNOSIS — R531 Weakness: Secondary | ICD-10-CM | POA: Diagnosis present

## 2022-05-15 DIAGNOSIS — E869 Volume depletion, unspecified: Secondary | ICD-10-CM | POA: Diagnosis present

## 2022-05-15 DIAGNOSIS — Z91138 Patient's unintentional underdosing of medication regimen for other reason: Secondary | ICD-10-CM

## 2022-05-15 DIAGNOSIS — Z794 Long term (current) use of insulin: Secondary | ICD-10-CM

## 2022-05-15 DIAGNOSIS — Z981 Arthrodesis status: Secondary | ICD-10-CM

## 2022-05-15 DIAGNOSIS — Z882 Allergy status to sulfonamides status: Secondary | ICD-10-CM

## 2022-05-15 DIAGNOSIS — Z96642 Presence of left artificial hip joint: Secondary | ICD-10-CM | POA: Diagnosis present

## 2022-05-15 DIAGNOSIS — G9341 Metabolic encephalopathy: Secondary | ICD-10-CM

## 2022-05-15 DIAGNOSIS — E1165 Type 2 diabetes mellitus with hyperglycemia: Secondary | ICD-10-CM

## 2022-05-15 DIAGNOSIS — E890 Postprocedural hypothyroidism: Secondary | ICD-10-CM | POA: Diagnosis present

## 2022-05-15 DIAGNOSIS — Z79899 Other long term (current) drug therapy: Secondary | ICD-10-CM

## 2022-05-15 DIAGNOSIS — R6510 Systemic inflammatory response syndrome (SIRS) of non-infectious origin without acute organ dysfunction: Secondary | ICD-10-CM

## 2022-05-15 DIAGNOSIS — E039 Hypothyroidism, unspecified: Secondary | ICD-10-CM | POA: Diagnosis present

## 2022-05-15 DIAGNOSIS — Z8371 Family history of colonic polyps: Secondary | ICD-10-CM

## 2022-05-15 DIAGNOSIS — E871 Hypo-osmolality and hyponatremia: Secondary | ICD-10-CM | POA: Diagnosis present

## 2022-05-15 DIAGNOSIS — Z20822 Contact with and (suspected) exposure to covid-19: Secondary | ICD-10-CM | POA: Diagnosis present

## 2022-05-15 DIAGNOSIS — E46 Unspecified protein-calorie malnutrition: Secondary | ICD-10-CM | POA: Diagnosis present

## 2022-05-15 DIAGNOSIS — Z801 Family history of malignant neoplasm of trachea, bronchus and lung: Secondary | ICD-10-CM

## 2022-05-15 DIAGNOSIS — R262 Difficulty in walking, not elsewhere classified: Secondary | ICD-10-CM | POA: Diagnosis present

## 2022-05-15 DIAGNOSIS — E876 Hypokalemia: Secondary | ICD-10-CM | POA: Diagnosis present

## 2022-05-15 DIAGNOSIS — Z833 Family history of diabetes mellitus: Secondary | ICD-10-CM

## 2022-05-15 DIAGNOSIS — Z6827 Body mass index (BMI) 27.0-27.9, adult: Secondary | ICD-10-CM

## 2022-05-15 DIAGNOSIS — Z79890 Hormone replacement therapy (postmenopausal): Secondary | ICD-10-CM

## 2022-05-15 DIAGNOSIS — Z83438 Family history of other disorder of lipoprotein metabolism and other lipidemia: Secondary | ICD-10-CM

## 2022-05-15 DIAGNOSIS — I10 Essential (primary) hypertension: Secondary | ICD-10-CM | POA: Diagnosis present

## 2022-05-15 DIAGNOSIS — Z7982 Long term (current) use of aspirin: Secondary | ICD-10-CM

## 2022-05-15 DIAGNOSIS — N390 Urinary tract infection, site not specified: Secondary | ICD-10-CM | POA: Diagnosis present

## 2022-05-15 DIAGNOSIS — I959 Hypotension, unspecified: Secondary | ICD-10-CM | POA: Diagnosis not present

## 2022-05-15 DIAGNOSIS — N179 Acute kidney failure, unspecified: Secondary | ICD-10-CM | POA: Diagnosis present

## 2022-05-15 DIAGNOSIS — D649 Anemia, unspecified: Secondary | ICD-10-CM | POA: Diagnosis present

## 2022-05-15 DIAGNOSIS — R680 Hypothermia, not associated with low environmental temperature: Secondary | ICD-10-CM | POA: Diagnosis present

## 2022-05-15 LAB — CBC WITH DIFFERENTIAL/PLATELET
Abs Immature Granulocytes: 0.23 10*3/uL — ABNORMAL HIGH (ref 0.00–0.07)
Basophils Absolute: 0.2 10*3/uL — ABNORMAL HIGH (ref 0.0–0.1)
Basophils Relative: 1 %
Eosinophils Absolute: 0.2 10*3/uL (ref 0.0–0.5)
Eosinophils Relative: 1 %
HCT: 32.7 % — ABNORMAL LOW (ref 36.0–46.0)
Hemoglobin: 10 g/dL — ABNORMAL LOW (ref 12.0–15.0)
Immature Granulocytes: 1 %
Lymphocytes Relative: 10 %
Lymphs Abs: 2.3 10*3/uL (ref 0.7–4.0)
MCH: 24.7 pg — ABNORMAL LOW (ref 26.0–34.0)
MCHC: 30.6 g/dL (ref 30.0–36.0)
MCV: 80.7 fL (ref 80.0–100.0)
Monocytes Absolute: 3 10*3/uL — ABNORMAL HIGH (ref 0.1–1.0)
Monocytes Relative: 14 %
Neutro Abs: 15.9 10*3/uL — ABNORMAL HIGH (ref 1.7–7.7)
Neutrophils Relative %: 73 %
Platelets: 429 10*3/uL — ABNORMAL HIGH (ref 150–400)
RBC: 4.05 MIL/uL (ref 3.87–5.11)
RDW: 16 % — ABNORMAL HIGH (ref 11.5–15.5)
WBC: 21.7 10*3/uL — ABNORMAL HIGH (ref 4.0–10.5)
nRBC: 0 % (ref 0.0–0.2)

## 2022-05-15 LAB — TROPONIN I (HIGH SENSITIVITY): Troponin I (High Sensitivity): 3 ng/L (ref <18)

## 2022-05-15 LAB — COMPREHENSIVE METABOLIC PANEL
ALT: 40 U/L (ref 0–44)
AST: 81 U/L — ABNORMAL HIGH (ref 15–41)
Albumin: 3.4 g/dL — ABNORMAL LOW (ref 3.5–5.0)
Alkaline Phosphatase: 86 U/L (ref 38–126)
Anion gap: 12 (ref 5–15)
BUN: 34 mg/dL — ABNORMAL HIGH (ref 8–23)
CO2: 24 mmol/L (ref 22–32)
Calcium: 8.3 mg/dL — ABNORMAL LOW (ref 8.9–10.3)
Chloride: 92 mmol/L — ABNORMAL LOW (ref 98–111)
Creatinine, Ser: 1.68 mg/dL — ABNORMAL HIGH (ref 0.44–1.00)
GFR, Estimated: 31 mL/min — ABNORMAL LOW (ref >60)
Glucose, Bld: 240 mg/dL — ABNORMAL HIGH (ref 70–99)
Potassium: 4.3 mmol/L (ref 3.5–5.1)
Sodium: 128 mmol/L — ABNORMAL LOW (ref 135–145)
Total Bilirubin: 1.7 mg/dL — ABNORMAL HIGH (ref 0.3–1.2)
Total Protein: 6.6 g/dL (ref 6.5–8.1)

## 2022-05-15 LAB — MAGNESIUM: Magnesium: 1.4 mg/dL — ABNORMAL LOW (ref 1.7–2.4)

## 2022-05-15 LAB — LACTIC ACID, PLASMA: Lactic Acid, Venous: 2.2 mmol/L (ref 0.5–1.9)

## 2022-05-15 LAB — TSH: TSH: 9.865 u[IU]/mL — ABNORMAL HIGH (ref 0.350–4.500)

## 2022-05-15 MED ORDER — LACTATED RINGERS IV BOLUS
1000.0000 mL | Freq: Once | INTRAVENOUS | Status: AC
  Administered 2022-05-15: 1000 mL via INTRAVENOUS

## 2022-05-15 MED ORDER — NOREPINEPHRINE 4 MG/250ML-% IV SOLN
INTRAVENOUS | Status: AC
  Filled 2022-05-15: qty 250

## 2022-05-15 MED ORDER — ATROPINE SULFATE 1 MG/10ML IJ SOSY
1.0000 mg | PREFILLED_SYRINGE | Freq: Once | INTRAMUSCULAR | Status: AC
  Administered 2022-05-15: 1 mg via INTRAVENOUS
  Filled 2022-05-15: qty 10

## 2022-05-15 MED ORDER — DEXAMETHASONE SODIUM PHOSPHATE 10 MG/ML IJ SOLN
10.0000 mg | Freq: Once | INTRAMUSCULAR | Status: AC
  Administered 2022-05-15: 10 mg via INTRAVENOUS
  Filled 2022-05-15: qty 1

## 2022-05-15 MED ORDER — MAGNESIUM SULFATE 2 GM/50ML IV SOLN
2.0000 g | Freq: Once | INTRAVENOUS | Status: AC
  Administered 2022-05-15: 2 g via INTRAVENOUS
  Filled 2022-05-15: qty 50

## 2022-05-15 NOTE — ED Notes (Signed)
Date and time results received: 05/15/22 1000 (use smartphrase ".now" to insert current time) Test: Lactic Critical Value: 2.2  Name of Provider Notified: NA  Orders Received? Or Actions Taken?: NA

## 2022-05-15 NOTE — ED Notes (Signed)
Bladder scan showed pt had >400 in bladder

## 2022-05-15 NOTE — ED Notes (Signed)
Pt oxygen been assessed and placement has been changed multiple time to try and get an accurate reading. Another RN went to assess oxygen as well and it is not picking up correctly. EDP is aware of pt HR in 30-40s and that pt is still hypotension. Pt is resting and alert. Awaiting orders from EDP.

## 2022-05-15 NOTE — ED Provider Notes (Signed)
[facility] EMERGENCY DEPARTMENT Provider Note   CSN: [id number] Arrival date & time: 05/15/22  2036     History {Add pertinent medical, surgical, social history, OB history to HPI:1} Chief Complaint  Patient presents with   Weakness    Jeanne Kim is a 81 y.o. female.  She is brought in by EMS for feeling generally weak all over and not urinating for the last 2 days.  She was seen about 5 days ago for generalized weakness and of recent fall, head and head CT lab work without clear etiology identified.  She denies any headache chest pain shortness of breath abdominal pain vomiting diarrhea.  The history is provided by the patient and the EMS personnel.  Weakness Severity:  Severe Onset quality:  Gradual Timing:  Constant Progression:  Unchanged Chronicity:  New Relieved by:  Nothing Worsened by:  Activity Ineffective treatments:  Rest Associated symptoms: difficulty walking and falls   Associated symptoms: no abdominal pain, no chest pain, no cough, no fever, no headaches, no shortness of breath and no vomiting        Home Medications Prior to Admission medications   Medication Sig Start Date End Date Taking? Authorizing Provider  amLODipine (NORVASC) 5 MG tablet Take 5 mg by mouth daily. 09/08/20   [provider]  aspirin 81 MG chewable tablet Chew 1 tablet (81 mg total) by mouth daily. 10/13/20   Thurnell Lose, MD  atorvastatin (LIPITOR) 20 MG tablet Take 1 tablet (20 mg total) by mouth daily. 10/17/12   Hillary Bow, MD  CALCIUM-MAGNESIUM-ZINC PO Take 3 tablets by mouth daily.    [provider]  Cholecalciferol (VITAMIN D3) 50 MCG (2000 UT) capsule Take 2,000 Units by mouth daily. 09/27/20   [provider]  ciprofloxacin (CILOXAN) 0.3 % ophthalmic solution 1 drop 2 (two) times daily. 04/24/21   [provider]  Coenzyme Q10 (CO Q-10) 100 MG CAPS Take 100 mg by mouth daily.    [provider]  cyanocobalamin (,VITAMIN  B-12,) 1000 MCG/ML injection INJECT 1 ML INTO MUSCLE ONCE A MONTH Patient taking differently: Inject 1,000 mcg into the muscle every 30 (thirty) days. 04/27/19   Mauri Pole, MD  doxepin (SINEQUAN) 10 MG capsule Take 10 mg by mouth at bedtime. 02/16/19   [provider]  DULoxetine (CYMBALTA) 60 MG capsule Take 60 mg by mouth daily.    [provider]  EPINEPHrine 0.3 mg/0.3 mL IJ SOAJ injection Inject 0.3 mg into the muscle daily as needed for anaphylaxis. 07/18/09   [provider]  ferrous sulfate 325 (65 FE) MG tablet Take 325 mg by mouth daily. 10/13/20   [provider]  fexofenadine (ALLEGRA) 30 MG tablet Take 30 mg by mouth daily.    [provider]  fluticasone (FLONASE) 50 MCG/ACT nasal spray Place 2 sprays into the nose daily.    [provider]  furosemide (LASIX) 20 MG tablet Take 20 mg by mouth daily. 05/01/21   [provider]  furosemide (LASIX) 40 MG tablet Take 1 tablet (40 mg total) by mouth 3 (three) times a week. Take on Monday/Wednesday/Friday 07/06/20   Richardson Dopp T, PA-C  gabapentin (NEURONTIN) 300 MG capsule Take 300 mg by mouth at bedtime.    [provider]  gentamicin (GARAMYCIN) 0.3 % ophthalmic solution Place 1 drop into both eyes 3 (three) times daily as needed (red or irritated eyes). 06/16/20   [provider]  hydrOXYzine (VISTARIL) 25 MG  capsule Take 50 mg by mouth every 8 (eight) hours as needed for anxiety or itching. 11/04/20   [provider]  Insulin Glargine-Lixisenatide 100-33 UNT-MCG/ML SOPN Inject 20-25 Units into the skin daily. Pt adjust between 20-25 units    [provider]  levothyroxine (SYNTHROID) 150 MCG tablet Take 1 tablet (150 mcg total) by mouth daily at 6 (six) AM. 05/06/21   Pokhrel, Corrie Mckusick, MD  linaclotide (LINZESS) 290 MCG CAPS capsule Take 1 capsule (290 mcg total) by mouth daily. MUST HAVE OFFICE VISIT FOR ADDITIONAL REFILLS 04/04/21    Mauri Pole, MD  magnesium oxide (MAG-OX) 400 MG tablet Take 400 mg by mouth daily.    [provider]  meclizine (ANTIVERT) 25 MG tablet Take 1 tablet (25 mg total) by mouth 3 (three) times daily as needed for dizziness. 09/29/20   Thurnell Lose, MD  Melatonin 10 MG TABS Take 10 mg by mouth at bedtime as needed (sleep).    [provider]  metFORMIN (GLUCOPHAGE) 1000 MG tablet Take 1,000 mg by mouth 2 (two) times daily with a meal.    [provider]  metolazone (ZAROXOLYN) 5 MG tablet Take 5 mg by mouth daily.    [provider]  metoprolol succinate (TOPROL-XL) 50 MG 24 hr tablet Take 1 tablet (50 mg total) by mouth daily. Please make overdue appt with Dr. Burt Knack before anymore refills. Thank you 3rd and Final Attempt 10/17/21   Sherren Mocha, MD  midodrine (PROAMATINE) 10 MG tablet Take 10 mg by mouth daily. 12/06/20   [provider]  montelukast (SINGULAIR) 10 MG tablet Take 10 mg by mouth at bedtime.    [provider]  nitroGLYCERIN (NITROSTAT) 0.4 MG SL tablet PLACE 1 TABLET (0.4 MG TOTAL) UNDER THE TONGUE EVERY 5 (FIVE) MINUTES AS NEEDED FOR CHEST PAIN. 12/03/19   Sherren Mocha, MD  NOVOLOG FLEXPEN 100 UNIT/ML FlexPen Inject 0-20 Units into the skin daily. At night , on sliding scale. 11/16/20   [provider]  nystatin (MYCOSTATIN) 100000 UNIT/ML suspension Use as directed 5 mLs in the mouth or throat daily as needed (mouthwash). 09/27/20   [provider]  Omega-3 Fatty Acids (FISH OIL) 1000 MG CAPS Take 1 capsule by mouth daily. 07/18/09   [provider]  ondansetron (ZOFRAN) 4 MG tablet Take 1 tablet (4 mg total) by mouth every 8 (eight) hours as needed for nausea or vomiting. 09/29/20   Thurnell Lose, MD  pantoprazole (PROTONIX) 40 MG tablet Take 1 tablet (40 mg total) by mouth 2 (two) times daily. 08/07/20   Mariel Aloe, MD  potassium chloride (KLOR-CON) 10 MEQ tablet Take 1 tablet (10  mEq total) by mouth daily for 5 days. 05/02/21 05/07/21  Loni Beckwith, PA-C  ramipril (ALTACE) 5 MG capsule Take 1 capsule (5 mg total) by mouth at bedtime. 01/11/16   Sherren Mocha, MD  traMADol (ULTRAM) 50 MG tablet Take 50 mg by mouth daily as needed for moderate pain. 10/25/20   [provider]      Allergies    Codeine, Sulfonamide derivatives, Canagliflozin, Penicillins, and Sulfa antibiotics    Review of Systems   Review of Systems  Constitutional:  Negative for fever.  Respiratory:  Negative for cough and shortness of breath.   Cardiovascular:  Negative for chest pain.  Gastrointestinal:  Negative for abdominal pain and vomiting.  Musculoskeletal:  Positive for falls.  Neurological:  Positive for weakness. Negative for headaches.  Physical Exam Updated Vital Signs Ht '5\' 5"'$  (1.651 m)   Wt 74 kg   BMI 27.15 kg/m  Physical Exam Vitals and nursing note reviewed.  Constitutional:      General: She is not in acute distress.    Appearance: Normal appearance. She is well-developed.  HENT:     Head: Normocephalic and atraumatic.  Eyes:     Conjunctiva/sclera: Conjunctivae normal.  Cardiovascular:     Rate and Rhythm: Regular rhythm. Bradycardia present.     Heart sounds: No murmur heard. Pulmonary:     Effort: Pulmonary effort is normal. No respiratory distress.     Breath sounds: Normal breath sounds.  Abdominal:     Palpations: Abdomen is soft.     Tenderness: There is no abdominal tenderness. There is no guarding or rebound.  Musculoskeletal:        General: Normal range of motion.     Cervical back: Neck supple.     Right lower leg: No edema.     Left lower leg: No edema.  Skin:    General: Skin is warm and dry.     Capillary Refill: Capillary refill takes less than 2 seconds.  Neurological:     General: No focal deficit present.     Mental Status: She is alert.     ED Results / Procedures / Treatments   Labs (all labs ordered are listed,  but only abnormal results are displayed) Labs Reviewed  URINE CULTURE  COMPREHENSIVE METABOLIC PANEL  CBC WITH DIFFERENTIAL/PLATELET  LACTIC ACID, PLASMA  LACTIC ACID, PLASMA  URINALYSIS, ROUTINE W REFLEX MICROSCOPIC  TSH  TROPONIN I (HIGH SENSITIVITY)    EKG None  Radiology No results found.  Procedures .Critical Care  Performed by: Hayden Rasmussen, MD Authorized by: Hayden Rasmussen, MD   Critical care provider statement:    Critical care time (minutes):  45   Critical care time was exclusive of:  Separately billable procedures and treating other patients   Critical care was time spent personally by me on the following activities:  Development of treatment plan with patient or surrogate, discussions with consultants, evaluation of patient's response to treatment, examination of patient, obtaining history from patient or surrogate, ordering and performing treatments and interventions, ordering and review of laboratory studies, ordering and review of radiographic studies, pulse oximetry and re-evaluation of patient's condition   I assumed direction of critical care for this patient from another provider in my specialty: no     {Document cardiac monitor, telemetry assessment procedure when appropriate:1}  Medications Ordered in ED Medications  lactated ringers bolus 1,000 mL (has no administration in time range)    ED Course/ Medical Decision Making/ A&P Clinical Course as of 05/15/22 2350  Tue May 15, 2022  2126 Chest x-ray interpreted by me as no acute pulmonary disease.  Awaiting radiology reading. [MB]  2339 Dec urinary output, weakness, possible adrenal insufficency, new junctional bradyardia and hypotension; pending UA [MK]    Clinical Course User Index [MB] Hayden Rasmussen, MD [MK] Kommor, Debe Coder, MD                           Medical Decision Making Amount and/or Complexity of Data Reviewed Labs: ordered. Radiology: ordered.  Risk Prescription drug  management.  This patient complains of ***; this involves an extensive number of treatment Options and is a complaint that carries with it a high risk of complications and morbidity. The  differential includes ***  I ordered, reviewed and interpreted labs, which included *** I ordered medication *** and reviewed PMP when indicated. I ordered imaging studies which included *** and I independently    visualized and interpreted imaging which showed *** Additional history obtained from *** Previous records obtained and reviewed *** I consulted *** and discussed lab and imaging findings and discussed disposition.  Cardiac monitoring reviewed, *** Social determinants considered, *** Critical Interventions: ***  After the interventions stated above, I reevaluated the patient and found *** Admission and further testing considered, ***    {Document critical care time when appropriate:1} {Document review of labs and clinical decision tools ie heart score, Chads2Vasc2 etc:1}  {Document your independent review of radiology images, and any outside records:1} {Document your discussion with family members, caretakers, and with consultants:1} {Document social determinants of health affecting pt's care:1} {Document your decision making why or why not admission, treatments were needed:1} Final Clinical Impression(s) / ED Diagnoses Final diagnoses:  None    Rx / DC Orders ED Discharge Orders     None

## 2022-05-15 NOTE — ED Triage Notes (Addendum)
Pt complaining of weakness x last few days. Pt hypotensive and bradycardiac in route. Pt A&O x4. Pt was recently here for a UTI. Pt states that she has not eaten or drank in the last few days.

## 2022-05-16 ENCOUNTER — Emergency Department (HOSPITAL_COMMUNITY): Payer: Medicare HMO

## 2022-05-16 ENCOUNTER — Encounter (HOSPITAL_COMMUNITY): Payer: Self-pay | Admitting: Internal Medicine

## 2022-05-16 DIAGNOSIS — G9341 Metabolic encephalopathy: Secondary | ICD-10-CM

## 2022-05-16 DIAGNOSIS — R7989 Other specified abnormal findings of blood chemistry: Secondary | ICD-10-CM | POA: Diagnosis present

## 2022-05-16 DIAGNOSIS — I959 Hypotension, unspecified: Secondary | ICD-10-CM | POA: Diagnosis present

## 2022-05-16 DIAGNOSIS — T501X6A Underdosing of loop [high-ceiling] diuretics, initial encounter: Secondary | ICD-10-CM | POA: Diagnosis present

## 2022-05-16 DIAGNOSIS — Z96642 Presence of left artificial hip joint: Secondary | ICD-10-CM | POA: Diagnosis present

## 2022-05-16 DIAGNOSIS — R6510 Systemic inflammatory response syndrome (SIRS) of non-infectious origin without acute organ dysfunction: Secondary | ICD-10-CM | POA: Diagnosis present

## 2022-05-16 DIAGNOSIS — E1165 Type 2 diabetes mellitus with hyperglycemia: Secondary | ICD-10-CM

## 2022-05-16 DIAGNOSIS — Z20822 Contact with and (suspected) exposure to covid-19: Secondary | ICD-10-CM | POA: Diagnosis present

## 2022-05-16 DIAGNOSIS — A419 Sepsis, unspecified organism: Secondary | ICD-10-CM | POA: Diagnosis not present

## 2022-05-16 DIAGNOSIS — I10 Essential (primary) hypertension: Secondary | ICD-10-CM | POA: Diagnosis present

## 2022-05-16 DIAGNOSIS — R4189 Other symptoms and signs involving cognitive functions and awareness: Secondary | ICD-10-CM | POA: Diagnosis present

## 2022-05-16 DIAGNOSIS — D649 Anemia, unspecified: Secondary | ICD-10-CM | POA: Diagnosis present

## 2022-05-16 DIAGNOSIS — N179 Acute kidney failure, unspecified: Secondary | ICD-10-CM

## 2022-05-16 DIAGNOSIS — E876 Hypokalemia: Secondary | ICD-10-CM | POA: Diagnosis present

## 2022-05-16 DIAGNOSIS — Z794 Long term (current) use of insulin: Secondary | ICD-10-CM

## 2022-05-16 DIAGNOSIS — N390 Urinary tract infection, site not specified: Secondary | ICD-10-CM | POA: Diagnosis present

## 2022-05-16 DIAGNOSIS — I2510 Atherosclerotic heart disease of native coronary artery without angina pectoris: Secondary | ICD-10-CM

## 2022-05-16 DIAGNOSIS — E869 Volume depletion, unspecified: Secondary | ICD-10-CM | POA: Diagnosis present

## 2022-05-16 DIAGNOSIS — E039 Hypothyroidism, unspecified: Secondary | ICD-10-CM | POA: Diagnosis not present

## 2022-05-16 DIAGNOSIS — E871 Hypo-osmolality and hyponatremia: Secondary | ICD-10-CM | POA: Diagnosis present

## 2022-05-16 DIAGNOSIS — E785 Hyperlipidemia, unspecified: Secondary | ICD-10-CM | POA: Diagnosis present

## 2022-05-16 DIAGNOSIS — E890 Postprocedural hypothyroidism: Secondary | ICD-10-CM | POA: Diagnosis present

## 2022-05-16 DIAGNOSIS — R531 Weakness: Secondary | ICD-10-CM | POA: Diagnosis present

## 2022-05-16 DIAGNOSIS — D72823 Leukemoid reaction: Secondary | ICD-10-CM | POA: Diagnosis present

## 2022-05-16 DIAGNOSIS — E46 Unspecified protein-calorie malnutrition: Secondary | ICD-10-CM | POA: Diagnosis present

## 2022-05-16 DIAGNOSIS — R0609 Other forms of dyspnea: Secondary | ICD-10-CM | POA: Diagnosis not present

## 2022-05-16 DIAGNOSIS — I6522 Occlusion and stenosis of left carotid artery: Secondary | ICD-10-CM | POA: Diagnosis present

## 2022-05-16 LAB — CBC
HCT: 35.8 % — ABNORMAL LOW (ref 36.0–46.0)
Hemoglobin: 11.2 g/dL — ABNORMAL LOW (ref 12.0–15.0)
MCH: 24.8 pg — ABNORMAL LOW (ref 26.0–34.0)
MCHC: 31.3 g/dL (ref 30.0–36.0)
MCV: 79.4 fL — ABNORMAL LOW (ref 80.0–100.0)
Platelets: 515 10*3/uL — ABNORMAL HIGH (ref 150–400)
RBC: 4.51 MIL/uL (ref 3.87–5.11)
RDW: 16 % — ABNORMAL HIGH (ref 11.5–15.5)
WBC: 21.4 10*3/uL — ABNORMAL HIGH (ref 4.0–10.5)
nRBC: 0 % (ref 0.0–0.2)

## 2022-05-16 LAB — GLUCOSE, CAPILLARY
Glucose-Capillary: 236 mg/dL — ABNORMAL HIGH (ref 70–99)
Glucose-Capillary: 201 mg/dL — ABNORMAL HIGH (ref 70–99)
Glucose-Capillary: 252 mg/dL — ABNORMAL HIGH (ref 70–99)
Glucose-Capillary: 281 mg/dL — ABNORMAL HIGH (ref 70–99)

## 2022-05-16 LAB — URINALYSIS, ROUTINE W REFLEX MICROSCOPIC
Bilirubin Urine: NEGATIVE
Glucose, UA: >=500 mg/dL — AB
Hgb urine dipstick: NEGATIVE
Ketones, ur: NEGATIVE mg/dL
Leukocytes,Ua: NEGATIVE
Nitrite: NEGATIVE
Protein, ur: NEGATIVE mg/dL
Specific Gravity, Urine: 1.017 (ref 1.005–1.030)
pH: 7 (ref 5.0–8.0)

## 2022-05-16 LAB — COMPREHENSIVE METABOLIC PANEL
ALT: 57 U/L — ABNORMAL HIGH (ref 0–44)
AST: 83 U/L — ABNORMAL HIGH (ref 15–41)
Albumin: 3.4 g/dL — ABNORMAL LOW (ref 3.5–5.0)
Alkaline Phosphatase: 100 U/L (ref 38–126)
Anion gap: 13 (ref 5–15)
BUN: 30 mg/dL — ABNORMAL HIGH (ref 8–23)
CO2: 24 mmol/L (ref 22–32)
Calcium: 8.5 mg/dL — ABNORMAL LOW (ref 8.9–10.3)
Chloride: 94 mmol/L — ABNORMAL LOW (ref 98–111)
Creatinine, Ser: 1.3 mg/dL — ABNORMAL HIGH (ref 0.44–1.00)
GFR, Estimated: 42 mL/min — ABNORMAL LOW (ref >60)
Glucose, Bld: 234 mg/dL — ABNORMAL HIGH (ref 70–99)
Potassium: 4.1 mmol/L (ref 3.5–5.1)
Sodium: 131 mmol/L — ABNORMAL LOW (ref 135–145)
Total Bilirubin: 1.8 mg/dL — ABNORMAL HIGH (ref 0.3–1.2)
Total Protein: 7 g/dL (ref 6.5–8.1)

## 2022-05-16 LAB — RESP PANEL BY RT-PCR (FLU A&B, COVID) ARPGX2
Influenza A by PCR: NEGATIVE
Influenza B by PCR: NEGATIVE
SARS Coronavirus 2 by RT PCR: NEGATIVE

## 2022-05-16 LAB — CORTISOL: Cortisol, Plasma: 4.1 ug/dL

## 2022-05-16 LAB — HEMOGLOBIN A1C
Hgb A1c MFr Bld: 10.8 % — ABNORMAL HIGH (ref 4.8–5.6)
Mean Plasma Glucose: 263.26 mg/dL

## 2022-05-16 LAB — TROPONIN I (HIGH SENSITIVITY): Troponin I (High Sensitivity): 4 ng/L (ref <18)

## 2022-05-16 LAB — LACTIC ACID, PLASMA: Lactic Acid, Venous: 2 mmol/L (ref 0.5–1.9)

## 2022-05-16 LAB — MAGNESIUM: Magnesium: 1.6 mg/dL — ABNORMAL LOW (ref 1.7–2.4)

## 2022-05-16 LAB — OSMOLALITY: Osmolality: 290 mOsm/kg (ref 275–295)

## 2022-05-16 LAB — BRAIN NATRIURETIC PEPTIDE: B Natriuretic Peptide: 497 pg/mL — ABNORMAL HIGH (ref 0.0–100.0)

## 2022-05-16 LAB — MRSA NEXT GEN BY PCR, NASAL: MRSA by PCR Next Gen: NOT DETECTED

## 2022-05-16 LAB — PROCALCITONIN: Procalcitonin: <0.1 ng/mL

## 2022-05-16 LAB — T4, FREE: Free T4: 2.02 ng/dL — ABNORMAL HIGH (ref 0.61–1.12)

## 2022-05-16 MED ORDER — HYDROXYZINE HCL 50 MG PO TABS: 50.0000 mg | ORAL_TABLET | Freq: Three times a day (TID) | ORAL | Status: DC | PRN

## 2022-05-16 MED ORDER — POLYETHYLENE GLYCOL 3350 17 G PO PACK
17.0000 g | PACK | Freq: Every day | ORAL | Status: DC | PRN
  Administered 2022-05-20: 17 g via ORAL
  Filled 2022-05-16: qty 1

## 2022-05-16 MED ORDER — CHLORHEXIDINE GLUCONATE CLOTH 2 % EX PADS
6.0000 | MEDICATED_PAD | Freq: Every day | CUTANEOUS | Status: DC
  Administered 2022-05-16 – 2022-05-17 (×2): 6 via TOPICAL

## 2022-05-16 MED ORDER — SODIUM CHLORIDE 0.9 % IV SOLN: 250.0000 mL | INTRAVENOUS | Status: DC

## 2022-05-16 MED ORDER — DOCUSATE SODIUM 100 MG PO CAPS
100.0000 mg | ORAL_CAPSULE | Freq: Two times a day (BID) | ORAL | Status: DC | PRN
  Administered 2022-05-16 – 2022-05-20 (×2): 100 mg via ORAL
  Filled 2022-05-16 (×2): qty 1

## 2022-05-16 MED ORDER — ENOXAPARIN SODIUM 40 MG/0.4ML IJ SOSY
40.0000 mg | PREFILLED_SYRINGE | INTRAMUSCULAR | Status: DC
  Administered 2022-05-17 – 2022-05-22 (×6): 40 mg via SUBCUTANEOUS
  Filled 2022-05-16 (×6): qty 0.4

## 2022-05-16 MED ORDER — ENOXAPARIN SODIUM 30 MG/0.3ML IJ SOSY
30.0000 mg | PREFILLED_SYRINGE | INTRAMUSCULAR | Status: DC
Start: 2022-05-16 — End: 2022-05-16
  Administered 2022-05-16: 30 mg via SUBCUTANEOUS
  Filled 2022-05-16: qty 0.3

## 2022-05-16 MED ORDER — NOREPINEPHRINE 4 MG/250ML-% IV SOLN
0.0000 ug/min | INTRAVENOUS | Status: DC
  Administered 2022-05-16: 2 ug/min via INTRAVENOUS

## 2022-05-16 MED ORDER — LEVOTHYROXINE SODIUM 75 MCG PO TABS
150.0000 ug | ORAL_TABLET | Freq: Every day | ORAL | Status: DC
  Administered 2022-05-16 – 2022-05-22 (×7): 150 ug via ORAL
  Filled 2022-05-16 (×8): qty 2

## 2022-05-16 MED ORDER — INSULIN ASPART 100 UNIT/ML IJ SOLN
0.0000 [IU] | Freq: Three times a day (TID) | INTRAMUSCULAR | Status: DC
  Administered 2022-05-16: 5 [IU] via SUBCUTANEOUS
  Administered 2022-05-16 (×2): 3 [IU] via SUBCUTANEOUS
  Administered 2022-05-17: 5 [IU] via SUBCUTANEOUS
  Administered 2022-05-17: 7 [IU] via SUBCUTANEOUS
  Administered 2022-05-17 – 2022-05-18 (×3): 3 [IU] via SUBCUTANEOUS
  Administered 2022-05-19: 1 [IU] via SUBCUTANEOUS
  Administered 2022-05-19 – 2022-05-20 (×3): 3 [IU] via SUBCUTANEOUS
  Administered 2022-05-20: 8 [IU] via SUBCUTANEOUS
  Administered 2022-05-20 – 2022-05-21 (×2): 2 [IU] via SUBCUTANEOUS
  Administered 2022-05-21: 1 [IU] via SUBCUTANEOUS
  Administered 2022-05-21: 2 [IU] via SUBCUTANEOUS
  Administered 2022-05-22: 3 [IU] via SUBCUTANEOUS
  Administered 2022-05-22: 2 [IU] via SUBCUTANEOUS

## 2022-05-16 MED ORDER — VANCOMYCIN HCL 1500 MG/300ML IV SOLN
1500.0000 mg | Freq: Once | INTRAVENOUS | Status: AC
Start: 2022-05-16 — End: 2022-05-16
  Administered 2022-05-16: 1500 mg via INTRAVENOUS
  Filled 2022-05-16: qty 300

## 2022-05-16 MED ORDER — MIDODRINE HCL 5 MG PO TABS
10.0000 mg | ORAL_TABLET | Freq: Every day | ORAL | Status: DC
  Administered 2022-05-16 – 2022-05-17 (×2): 10 mg via ORAL
  Filled 2022-05-16 (×2): qty 2

## 2022-05-16 MED ORDER — SODIUM CHLORIDE 0.9 % IV SOLN
2.0000 g | Freq: Once | INTRAVENOUS | Status: AC
  Administered 2022-05-16: 2 g via INTRAVENOUS
  Filled 2022-05-16: qty 12.5

## 2022-05-16 MED ORDER — INSULIN GLARGINE-YFGN 100 UNIT/ML ~~LOC~~ SOLN
12.0000 [IU] | Freq: Every day | SUBCUTANEOUS | Status: DC
  Administered 2022-05-16 – 2022-05-17 (×2): 12 [IU] via SUBCUTANEOUS
  Filled 2022-05-16 (×3): qty 0.12

## 2022-05-16 MED ORDER — INSULIN GLARGINE-LIXISENATIDE 100-33 UNT-MCG/ML ~~LOC~~ SOPN
20.0000 [IU] | PEN_INJECTOR | Freq: Every day | SUBCUTANEOUS | Status: DC
Start: 2022-05-16 — End: 2022-05-16

## 2022-05-16 MED ORDER — SODIUM CHLORIDE 0.9 % IV SOLN
250.0000 mL | INTRAVENOUS | Status: DC
Start: 2022-05-16 — End: 2022-05-16

## 2022-05-16 MED ORDER — INSULIN ASPART 100 UNIT/ML IJ SOLN
0.0000 [IU] | Freq: Every day | INTRAMUSCULAR | Status: DC
  Administered 2022-05-16: 3 [IU] via SUBCUTANEOUS
  Administered 2022-05-17 – 2022-05-20 (×4): 2 [IU] via SUBCUTANEOUS

## 2022-05-16 MED ORDER — VANCOMYCIN VARIABLE DOSE PER UNSTABLE RENAL FUNCTION (PHARMACIST DOSING): Status: DC

## 2022-05-16 MED ORDER — LINACLOTIDE 145 MCG PO CAPS
290.0000 ug | ORAL_CAPSULE | Freq: Every day | ORAL | Status: DC
  Administered 2022-05-16 – 2022-05-22 (×7): 290 ug via ORAL
  Filled 2022-05-16 (×6): qty 2

## 2022-05-16 MED ORDER — SODIUM CHLORIDE 0.9 % IV SOLN
2.0000 g | INTRAVENOUS | Status: DC
  Administered 2022-05-16: 2 g via INTRAVENOUS
  Filled 2022-05-16: qty 12.5

## 2022-05-16 MED ORDER — NOREPINEPHRINE 4 MG/250ML-% IV SOLN
2.0000 ug/min | INTRAVENOUS | Status: DC
  Administered 2022-05-16: 4 ug/min via INTRAVENOUS
  Administered 2022-05-17: 3 ug/min via INTRAVENOUS
  Filled 2022-05-16: qty 250

## 2022-05-16 MED ORDER — ATORVASTATIN CALCIUM 10 MG PO TABS
20.0000 mg | ORAL_TABLET | Freq: Every day | ORAL | Status: DC
  Administered 2022-05-16 – 2022-05-22 (×7): 20 mg via ORAL
  Filled 2022-05-16 (×3): qty 2
  Filled 2022-05-16 (×2): qty 1
  Filled 2022-05-16 (×2): qty 2

## 2022-05-16 MED ORDER — DULOXETINE HCL 60 MG PO CPEP
60.0000 mg | ORAL_CAPSULE | Freq: Every day | ORAL | Status: DC
  Administered 2022-05-16 – 2022-05-22 (×7): 60 mg via ORAL
  Filled 2022-05-16 (×7): qty 1

## 2022-05-16 MED ORDER — SODIUM CHLORIDE 0.9 % IV SOLN
INTRAVENOUS | Status: AC
Start: 2022-05-16 — End: 2022-05-16

## 2022-05-16 MED ORDER — LIVING WELL WITH DIABETES BOOK
Freq: Once | Status: AC
Start: 2022-05-16 — End: 2022-05-16
  Administered 2022-05-16: 1

## 2022-05-16 MED ORDER — ASPIRIN 81 MG PO CHEW
81.0000 mg | CHEWABLE_TABLET | Freq: Every day | ORAL | Status: DC
  Administered 2022-05-16 – 2022-05-22 (×7): 81 mg via ORAL
  Filled 2022-05-16 (×7): qty 1

## 2022-05-16 NOTE — H&P (Signed)
History and Physical    Patient: Jeanne Kim KZS:[id number] DOB: December 09, 1940 DOA: 05/15/2022 DOS: the patient was seen and examined on 05/16/2022 PCP: Neale Burly, MD  Patient coming from: home, lives with husband  Chief Complaint:  Chief Complaint  Patient presents with   Weakness   HPI: Jeanne Kim is a 81 y.o. female with medical history significant of Dm2, hypertension, hyperlipidemia, CAD,  L carotid stenosis , hypothyroidism, presents with c/o not feeling well yesterday. "Felt like she was going to die",  Pt having slight difficulty with urination. Pt denies fever, chills, cough, cp, palp, sob, n/v, diarrhea, brbpr, black stool.  EMS brought patient to hypotensive.  Pt notes poor appetite. Pt states she has not been taking her lasix.    In ED,  T 97.4, P 50, Bp 83/36, pox 94% on RA  CT chest abd/ pelvis->  IMPRESSION: 1. Mild cardiomegaly with prominent central pulmonary veins,interstitial edema in the lung bases and apices and minimal pleural effusions. Findings consistent with mild CHF or fluid overload. 2. Diffuse bronchial thickening which could indicate bronchitis or congestive bronchial thickening. No evidence of bronchopneumonia. 3. A few slightly prominent mediastinal nodes. No bulky or encasing adenopathy. 4. Aortic and coronary artery atherosclerosis and upper limit of normal pulmonary trunk caliber. 5. Minimal air in the pulmonary trunk likely iatrogenic. 6. Moderate-sized hiatal hernia. 7. Paraduodenal stranding most likely due to duodenitis or inflamed ulcerative disease. Not likely related to the diffusely atrophic pancreas but clinical correlation advised. No free air or abscess. 8. Constipation with diverticulosis. 9. Minimal ascites.  Mild body wall edema.   Wbc 21.7, Hgb 10.0, Plt 429 Na 128, K 4.3 Bun 34, Creatinine 1.68 Alb 3.4  Lactic acid 2.2 TSH 9.865 Urinalysis wbc 6-10  Pt given vanco 1gm iv x1, and cefepime 2gm iv x1, and decadron '10mg'$  iv  x1, and atropine '1mg'$  iv x1 in the ED, and started on levophed in ED for hypotension. Pt will be admitted for urosepsis   Review of Systems: negative for all 10 organ systems except for + above  Past Medical History:  Diagnosis Date   Allergy    Anemia    Anginal pain ([place])    Anxiety    Arthritis    Blood transfusion without reported diagnosis    Breast cancer ([place])    Carotid artery plaque    bilat   Cataract    Colon polyps    adenomatous   Concussion 02/2016   Driving restrictions for 6w   Coronary heart disease    Depression    Diverticulosis    Dizziness    Esophageal reflux    Esophageal stricture    Fibromyalgia    Gallstones    Hiatal hernia    Hypercholesterolemia    Hypertension    Hypothyroidism    IBS (irritable bowel syndrome)    Kidney stones    Osteoporosis    Pneumonia    recently, just finished ABO   Pneumonia    PONV (postoperative nausea and vomiting)    Sleep apnea    CPAP   Thyroid disease 2002   Type II or unspecified type diabetes mellitus without mention of complication, not stated as uncontrolled    Unspecified gastritis and gastroduodenitis without mention of hemorrhage    Past Surgical History:  Procedure Laterality Date   ANAL RECTAL MANOMETRY N/A 08/05/2015   Procedure: ANO RECTAL MANOMETRY;  Surgeon: Mauri Pole, MD;  Location: [facility];  Service: Endoscopy;  Laterality: N/A;   APPENDECTOMY     BIOPSY  08/06/2020   Procedure: BIOPSY;  Surgeon: Eloise Harman, DO;  Location: [facility];  Service: Endoscopy;;   BIOPSY  12/21/2020   Procedure: BIOPSY;  Surgeon: Thornton Park, MD;  Location: [facility];  Service: Gastroenterology;;   BIOPSY  12/23/2020   Procedure: BIOPSY;  Surgeon: Thornton Park, MD;  Location: [facility];  Service: Gastroenterology;;   BREAST LUMPECTOMY     left   CARDIAC CATHETERIZATION     CATARACT EXTRACTION W/PHACO Left 03/30/2013   Procedure: CATARACT EXTRACTION PHACO AND  INTRAOCULAR LENS PLACEMENT ([place]);  Surgeon: Tonny Branch, MD;  Location: [facility];  Service: Ophthalmology;  Laterality: Left;  CDE:19.28   CATARACT EXTRACTION W/PHACO Right 04/09/2013   Procedure: CATARACT EXTRACTION PHACO AND INTRAOCULAR LENS PLACEMENT ([facility]);  Surgeon: Tonny Branch, MD;  Location: [facility];  Service: Ophthalmology;  Laterality: Right;  CDE: 15.44   CERVICAL FUSION     CESAREAN SECTION     CHOLECYSTECTOMY     COLONOSCOPY WITH PROPOFOL N/A 12/23/2020   Procedure: COLONOSCOPY WITH PROPOFOL;  Surgeon: Thornton Park, MD;  Location: [facility];  Service: Gastroenterology;  Laterality: N/A;   CYSTOCELE REPAIR     DILATION AND CURETTAGE OF UTERUS     ESOPHAGOGASTRODUODENOSCOPY (EGD) WITH PROPOFOL N/A 08/06/2020   Procedure: ESOPHAGOGASTRODUODENOSCOPY (EGD) WITH PROPOFOL;  Surgeon: Eloise Harman, DO;  Location: [facility];  Service: Endoscopy;  Laterality: N/A;   ESOPHAGOGASTRODUODENOSCOPY (EGD) WITH PROPOFOL N/A 12/21/2020   Procedure: ESOPHAGOGASTRODUODENOSCOPY (EGD) WITH PROPOFOL;  Surgeon: Thornton Park, MD;  Location: [facility];  Service: Gastroenterology;  Laterality: N/A;   EYE SURGERY Left 03-30-13   Cataract   EYE SURGERY Right 04-09-13   Cataract   RECTOCELE REPAIR     THYROIDECTOMY     TONSILLECTOMY     TOTAL ABDOMINAL HYSTERECTOMY     Social History:  reports that she has never smoked. She has never used smokeless tobacco. She reports that she does not drink alcohol and does not use drugs.  Family History  Problem Relation Age of Onset   Sarcoidosis Mother    Diabetes Mother    Cancer Mother        sarcoma -  Right arm amputation   Hypertension Mother    Lung cancer Father    Diabetes Father    Heart disease Father        Before age 51   Urolithiasis Father    Cancer Father        Lung   Hyperlipidemia Father    Hypertension Father    Esophageal cancer Maternal Uncle    Ovarian cancer Maternal Aunt    Colon cancer Maternal Uncle    Lung cancer  Maternal Uncle    Diabetes Maternal Uncle    Diabetes Maternal Aunt    Colon polyps Maternal Uncle    Heart disease Paternal Grandmother     Allergies  Allergen Reactions   Codeine Hives   Sulfonamide Derivatives Hives   Canagliflozin    Penicillins Hives    Pt states she does NOT have allergy to penicillin   Sulfa Antibiotics Other (See Comments)    unknown    Prior to Admission medications   Medication Sig Start Date End Date Taking? Authorizing Provider  DULoxetine (CYMBALTA) 60 MG capsule Take 60 mg by mouth daily.   Yes [provider]  Insulin Glargine-Lixisenatide 100-33 UNT-MCG/ML SOPN Inject 20-25 Units into the skin daily. Pt adjust between  20-25 units   Yes [provider]  levothyroxine (SYNTHROID) 150 MCG tablet Take 1 tablet (150 mcg total) by mouth daily at 6 (six) AM. 05/06/21  Yes Pokhrel, L[place]man, MD  linaclotide (LINZESS) 290 MCG CAPS capsule Take 1 capsule (290 mcg total) by mouth daily. MUST HAVE OFFICE VISIT FOR ADDITIONAL REFILLS 04/04/21  Yes Nandigam, Venia Minks, MD  amLODipine (NORVASC) 5 MG tablet Take 5 mg by mouth daily. 09/08/20   [provider]  aspirin 81 MG chewable tablet Chew 1 tablet (81 mg total) by mouth daily. 10/13/20   Thurnell Lose, MD  atorvastatin (LIPITOR) 20 MG tablet Take 1 tablet (20 mg total) by mouth daily. 10/17/12   Hillary Bow, MD  CALCIUM-MAGNESIUM-ZINC PO Take 3 tablets by mouth daily.    [provider]  Cholecalciferol (VITAMIN D3) 50 MCG (2000 UT) capsule Take 2,000 Units by mouth daily. 09/27/20   [provider]  ciprofloxacin (CILOXAN) 0.3 % ophthalmic solution 1 drop 2 (two) times daily. 04/24/21   [provider]  Coenzyme Q10 (CO Q-10) 100 MG CAPS Take 100 mg by mouth daily.    [provider]  cyanocobalamin (,VITAMIN B-12,) 1000 MCG/ML injection INJECT 1 ML INTO MUSCLE ONCE A MONTH Patient taking differently: Inject 1,000 mcg into the muscle every 30  (thirty) days. 04/27/19   Mauri Pole, MD  doxepin (SINEQUAN) 10 MG capsule Take 10 mg by mouth at bedtime. 02/16/19   [provider]  EPINEPHrine 0.3 mg/0.3 mL IJ SOAJ injection Inject 0.3 mg into the muscle daily as needed for anaphylaxis. 07/18/09   [provider]  ferrous sulfate 325 (65 FE) MG tablet Take 325 mg by mouth daily. 10/13/20   [provider]  fexofenadine (ALLEGRA) 30 MG tablet Take 30 mg by mouth daily.    [provider]  fluticasone (FLONASE) 50 MCG/ACT nasal spray Place 2 sprays into the nose daily.    [provider]  furosemide (LASIX) 20 MG tablet Take 20 mg by mouth daily. 05/01/21   [provider]  furosemide (LASIX) 40 MG tablet Take 1 tablet (40 mg total) by mouth 3 (three) times a week. Take on Monday/Wednesday/Friday 07/06/20   Richardson Dopp T, PA-C  gabapentin (NEURONTIN) 300 MG capsule Take 300 mg by mouth at bedtime.    [provider]  gentamicin (GARAMYCIN) 0.3 % ophthalmic solution Place 1 drop into both eyes 3 (three) times daily as needed (red or irritated eyes). 06/16/20   [provider]  hydrOXYzine (VISTARIL) 25 MG capsule Take 50 mg by mouth every 8 (eight) hours as needed for anxiety or itching. 11/04/20   [provider]  magnesium oxide (MAG-OX) 400 MG tablet Take 400 mg by mouth daily.    [provider]  meclizine (ANTIVERT) 25 MG tablet Take 1 tablet (25 mg total) by mouth 3 (three) times daily as needed for dizziness. 09/29/20   Thurnell Lose, MD  Melatonin 10 MG TABS Take 10 mg by mouth at bedtime as needed (sleep).    [provider]  metFORMIN (GLUCOPHAGE) 1000 MG tablet Take 1,000 mg by mouth 2 (two) times daily with a meal.    [provider]  metolazone (ZAROXOLYN) 5 MG tablet Take 5 mg by mouth daily.    [provider]  metoprolol succinate (TOPROL-XL) 50 MG 24 hr tablet Take 1 tablet (50 mg total) by mouth daily. Please  make overdue appt with Dr. Burt Knack before anymore refills.  Thank you 3rd and Final Attempt 10/17/21   Sherren Mocha, MD  midodrine (PROAMATINE) 10 MG tablet Take 10 mg by mouth daily. 12/06/20   [provider]  montelukast (SINGULAIR) 10 MG tablet Take 10 mg by mouth at bedtime.    [provider]  nitroGLYCERIN (NITROSTAT) 0.4 MG SL tablet PLACE 1 TABLET (0.4 MG TOTAL) UNDER THE TONGUE EVERY 5 (FIVE) MINUTES AS NEEDED FOR CHEST PAIN. 12/03/19   Sherren Mocha, MD  NOVOLOG FLEXPEN 100 UNIT/ML FlexPen Inject 0-20 Units into the skin daily. At night , on sliding scale. 11/16/20   [provider]  nystatin (MYCOSTATIN) 100000 UNIT/ML suspension Use as directed 5 mLs in the mouth or throat daily as needed (mouthwash). 09/27/20   [provider]  Omega-3 Fatty Acids (FISH OIL) 1000 MG CAPS Take 1 capsule by mouth daily. 07/18/09   [provider]  ondansetron (ZOFRAN) 4 MG tablet Take 1 tablet (4 mg total) by mouth every 8 (eight) hours as needed for nausea or vomiting. 09/29/20   Thurnell Lose, MD  pantoprazole (PROTONIX) 40 MG tablet Take 1 tablet (40 mg total) by mouth 2 (two) times daily. 08/07/20   Mariel Aloe, MD  potassium chloride (KLOR-CON) 10 MEQ tablet Take 1 tablet (10 mEq total) by mouth daily for 5 days. 05/02/21 05/07/21  Loni Beckwith, PA-C  ramipril (ALTACE) 5 MG capsule Take 1 capsule (5 mg total) by mouth at bedtime. 01/11/16   Sherren Mocha, MD  traMADol (ULTRAM) 50 MG tablet Take 50 mg by mouth daily as needed for moderate pain. 10/25/20   [provider]    Physical Exam: Vitals:   05/16/22 0304 05/16/22 0324 05/16/22 0327 05/16/22 0336  BP: 99/85 (!) 106/47 (!) 128/56 (!) 123/90  Pulse: 60 62 65 64  Resp: (!) 21 20 (!) 21 (!) 24  Temp:      TempSrc:      SpO2: 91% 90%    Weight:      Height:       Heent: anicteric, pupils 1.38m symmetric, direct, consensual near intact Neck: no jvd, no bruit Heart: rrr s1,  s2, no m/g/r Lung: no crackle, no wheezing Abd: soft, obese, nt, nd, +bs Ext: no c/c/e,  Skin: no rash Neuro: cn2-12 intact, reflexes 2+ symmetric, diffuse with no clonus, motor 5/5 in all 4 ext Lymph: no obvious adenopathy  Data Reviewed:   Assessment and Plan:  Sepsis secondary to UTI Urine culture pending Blood culture pending Vanco iv pharmacy to dose Cefepime iv pharmacy to dose  Leukocytosis Check cbc in am  Hyponatremia Check serum osm, cortisol, urine osm, urine sodium Likely due to Diuretics, though patient states noncompliant with Lasix  Hypomagnesemia Repleted in ED Check magnesium in am  Mild ARF Hydrate gently with ns iv Check cmp in am  Anemia Check cbc in am If Hgb worsening, then consider b12, folate, iron, tibc, ferritin, spep, upep  Abnormal LFT Check acute hepatitis panel Check cmp in am  Protein Calorie Malnutrition Glucerna  Dm2 Fsbs ac and qhs, ISS  Hypothyroidism Cont Levothyroxine   Advance Care Planning: FULL CODE  Consults: none  Family Communication:  with husband  Severity of Illness: Pt will require > 2 nites stay, pt is critically ill requiring pressors, and with require iv abx.   Critical care: 45 minutes  Author: JJani Gravel MD 05/16/2022 3:46 AM  For on call review [email or web address]

## 2022-05-16 NOTE — ED Provider Notes (Signed)
Physical Exam  BP 115/81 (BP Location: Left Arm)   Pulse 69   Temp 97.7 F (36.5 C)   Resp (!) 23   Ht '5\' 6"'$  (1.676 m)   Wt 78.3 kg   SpO2 95%   BMI 27.86 kg/m   Physical Exam Vitals and nursing note reviewed.  Constitutional:      General: She is not in acute distress.    Appearance: She is well-developed.  HENT:     Head: Normocephalic and atraumatic.  Eyes:     Conjunctiva/sclera: Conjunctivae normal.  Cardiovascular:     Rate and Rhythm: Regular rhythm. Bradycardia present.     Heart sounds: No murmur heard. Pulmonary:     Effort: Pulmonary effort is normal. No respiratory distress.     Breath sounds: Normal breath sounds.  Abdominal:     Palpations: Abdomen is soft.     Tenderness: There is no abdominal tenderness.  Musculoskeletal:        General: No swelling.     Cervical back: Neck supple.     Right lower leg: Edema present.     Left lower leg: Edema present.  Skin:    General: Skin is warm and dry.     Capillary Refill: Capillary refill takes less than 2 seconds.  Neurological:     Mental Status: She is alert.  Psychiatric:        Mood and Affect: Mood normal.     Procedures  .Critical Care  Performed by: Teressa Lower, MD Authorized by: Teressa Lower, MD   Critical care provider statement:    Critical care time (minutes):  30   Critical care was necessary to treat or prevent imminent or life-threatening deterioration of the following conditions:  Circulatory failure, sepsis and endocrine crisis   Critical care was time spent personally by me on the following activities:  Development of treatment plan with patient or surrogate, discussions with consultants, evaluation of patient's response to treatment, examination of patient, ordering and review of laboratory studies, ordering and review of radiographic studies, ordering and performing treatments and interventions, pulse oximetry, re-evaluation of patient's condition and review of old  charts   ED Course / MDM   Clinical Course as of 05/16/22 0515  Tue May 15, 2022  2126 Chest x-ray interpreted by me as no acute pulmonary disease.  Awaiting radiology reading. [MB]  2339 Dec urinary output, weakness, possible adrenal insufficency, new junctional bradyardia and hypotension; pending UA [MK]    Clinical Course User Index [MB] Hayden Rasmussen, MD [MK] Aydn Ferrara, Debe Coder, MD   Medical Decision Making Amount and/or Complexity of Data Reviewed Labs: ordered. Radiology: ordered.  Risk Prescription drug management. Decision regarding hospitalization.   Patient received in handoff.  Weakness with decreased urinary output, new junctional bradycardia and hypotension.  Urinalysis pending at time of signout that shows 6-10 white blood cells, many bacteria but no nitrites or leuk esterase.  On reevaluation, patient is bradycardic and hypotensive into the 70s and I performed a 1 mg atropine trial that briefly did improve the patient's blood pressure and heart rate, but this improvement only lasted approximately 3 minutes and given the undifferentiated nature of her bradycardia and hypotension with minimal response to 2 L of fluid, Levophed was initiated.  I consulted the on-call cardiologist Dr. Kalman Shan who believes that this new junctional bradycardia may be secondary to her hypothyroidism and in the setting of patient's hypothermia, bradycardia and hypotension, myxedema coma remains on the differential.  T3 and T4  ordered.  Hospitalist then consulted for admission to the ICU here at [facility].       Teressa Lower, MD 05/16/22 [phone]

## 2022-05-16 NOTE — Assessment & Plan Note (Signed)
Presented with hypotension, tachycardia, and leukocytosis Suspect underlying bacteremia Personally reviewed chest x-ray--no consolidation UA without pyuria Follow blood cultures Continue empiric vancomycin and cefepime Continue IV fluids Lactic acid peaked 2.2 Check PCT

## 2022-05-16 NOTE — Assessment & Plan Note (Addendum)
Baseline creatinine 0.7-1.0 Presented with serum creatinine 1.68>>0.64 Secondary to volume depletion and hypotension Ramipril and metolazone>> were on HOLD \ BP remains soft/controlled Resume urosemide Improved with IVF>>now saline D/Ced

## 2022-05-16 NOTE — Assessment & Plan Note (Addendum)
There has been concern that the patient has not been taking her medications at home TSH 9.865 Restarted levothyroxine

## 2022-05-16 NOTE — Hospital Course (Addendum)
Jeanne Kim 81 year old female with a history of diabetes mellitus, hypertension, hyperlipidemia, hypothyroidism, coronary artery disease presenting with approximately 1 week history of generalized weakness and decreased oral intake and decreased urine output.  Unfortunately, the patient is a poor historian secondary to some cognitive impairment and acute encephalopathy.  Nevertheless, the patient denies any chest pain, shortness breath, abdominal pain, nausea, vomiting, diarrhea, abdominal pain. In the ED, the patient was noted to be hypothermic with a temperature of 96.6 F.  She was hypotensive with blood pressure 83/36.  She was started on Levophed.  WBC 21.7, hemoglobin 10.0, platelets 429,000.  Sodium 128, potassium 4.3, bicarbonate 24, serum creatinine 1.68.  TSH 9.865.  CT of the chest abdomen pelvis showed some interlobular septal thickening.  There is no cardiomegaly and prominent central pulmonary veins.  There is diffuse bronchial wall thickening.  There was no consolidation.  There is periduodenal stranding without any free air or ascites.  UA was negative for pyuria.  The patient was started vancomycin and cefepime and IV fluids.  She was admitted for further evaluation and treatment.  She was initially started on Levophed. She was ultimately weaned off levophed with IVF resuscitation.  After all cultures remained neg and pt remained stable, vanc and cefepime were discontinued and patient remains clinically stable.  Current barrier for d/c is waiting for insurance authorization.

## 2022-05-16 NOTE — Assessment & Plan Note (Addendum)
No chest pain presently Personally reviewed EKG--junctional rhythm, no ST-T wave changes

## 2022-05-16 NOTE — Progress Notes (Addendum)
PROGRESS NOTE  Jeanne Kim IFO:[id number] DOB: 07-25-1941 DOA: 05/15/2022 PCP: Neale Burly, MD  Brief History:  81 year old female with a history of diabetes mellitus, hypertension, hyperlipidemia, hypothyroidism, coronary artery disease presenting with approximately 1 week history of generalized weakness and decreased oral intake and decreased urine output.  Unfortunately, the patient is a poor historian secondary to some cognitive impairment and acute encephalopathy.  Nevertheless, the patient denies any chest pain, shortness breath, abdominal pain, nausea, vomiting, diarrhea, abdominal pain. In the ED, the patient was noted to be hypothermic with a temperature of 96.6 F.  She was hypotensive with blood pressure 83/36.  She was started on Levophed.  WBC 21.7, hemoglobin 10.0, platelets 429,000.  Sodium 128, potassium 4.3, bicarbonate 24, serum creatinine 1.68.  TSH 9.865.  CT of the chest abdomen pelvis showed some interlobular septal thickening.  There is no cardiomegaly and prominent central pulmonary veins.  There is diffuse bronchial wall thickening.  There was no consolidation.  There is periduodenal stranding without any free air or ascites.  UA was negative for pyuria.  The patient was started vancomycin and cefepime and IV fluids.  She was admitted for further evaluation and treatment.  She was initially started on Levophed.   Assessment and Plan: * Sepsis due to undetermined organism [facility]) Presented with hypotension, tachycardia, and leukocytosis Suspect underlying bacteremia Personally reviewed chest x-ray--no consolidation UA without pyuria Follow blood cultures Continue empiric vancomycin and cefepime Continue IV fluids Lactic acid peaked 2.2 Check PCT  Acute metabolic encephalopathy Patient remains confused and agitated Secondary to sepsis and acute kidney injury Has underlying cognitive impairment  AKI (acute kidney injury) ([place]) Baseline creatinine  0.7-1.0 Presented with serum creatinine 1.68 Secondary to volume depletion and sepsis Holding ramipril and metolazone Holding furosemide  Hypomagnesemia replete  Uncontrolled type 2 diabetes mellitus with hyperglycemia, with long-term current use of insulin (HCC) NovoLog sliding scale Check hemoglobin A1c Start reduced dose Sem[place]e Holding metformin  CAD (coronary artery disease) No chest pain presently Personally reviewed EKG--junctional rhythm, no ST-T wave changes Repeat EKG  Essential hypertension Holding ramipril amlodipine and metoprolol succinate secondary to hypotension Family states that the patient has not been taking her medications properly anyway  Hypothyroidism There has been concern that the patient has not been taking her medications at home TSH 9.865 Restart levothyroxine     Family Communication:   daughter Mickel Baas updated 8/9  Consultants:  none  Code Status:  FULL  DVT Prophylaxis:  SSC Lovenox   Procedures: As Listed in Progress Note Above  Antibiotics: Vanc 8/9>. Cefepime 8/9>   The patient is critically ill with multiple organ systems failure and requires high complexity decision making for assessment and support, frequent evaluation and titration of therapies, application of advanced monitoring technologies and extensive interpretation of multiple databases.  Critical care time - 45 mins.  In addition to the time spent by Dr. Maudie Mercury     Subjective: Patient remains confused.  Patient denies fevers, chills, headache, chest pain, dyspnea, nausea, vomiting, diarrhea, abdominal pain, dysuria,    Objective: Vitals:   05/16/22 0715 05/16/22 0726 05/16/22 0735 05/16/22 0740  BP: 108/84  (!) 145/117   Pulse: 95 (!) 102 75 77  Resp: 11 18 (!) 26 19  Temp:      TempSrc:      SpO2: (!) 69% (!) 81% 93% 92%  Weight:      Height:  Intake/Output Summary (Last 24 hours) at 05/16/2022 0741 Last data filed at 05/16/2022 5597 Gross per 24  hour  Intake 2596.78 ml  Output --  Net 2596.78 ml   Weight change:  Exam:  General:  Pt is alert, follows commands appropriately, not in acute distress HEENT: No icterus, No thrush, No neck mass, [place]/AT Cardiovascular: RRR, S1/S2, no rubs, no gallops Respiratory: bibasilar crackles. No wheeze Abdomen: Soft/+BS, non tender, non distended, no guarding Extremities: trace LE edema, No lymphangitis, No petechiae, No rashes, no synovitis   Data Reviewed: I have personally reviewed following labs and imaging studies Basic Metabolic Panel: Recent Labs  Lab 05/11/22 0002 05/15/22 2047 05/15/22 2130  NA 130* 128*  --   K 3.9 4.3  --   CL 96* 92*  --   CO2 23 24  --   GLUCOSE 215* 240*  --   BUN 14 34*  --   CREATININE 0.76 1.68*  --   CALCIUM 8.2* 8.3*  --   MG  --   --  1.4*   Liver Function Tests: Recent Labs  Lab 05/11/22 0002 05/15/22 2047  AST 19 81*  ALT 16 40  ALKPHOS 72 86  BILITOT 1.5* 1.7*  PROT 7.0 6.6  ALBUMIN 3.7 3.4*   No results for input(s): "LIPASE", "AMYLASE" in the last 168 hours. Recent Labs  Lab 05/11/22 0002  AMMONIA 11   Coagulation Profile: No results for input(s): "INR", "PROTIME" in the last 168 hours. CBC: Recent Labs  Lab 05/11/22 0002 05/15/22 2047  WBC 11.2* 21.7*  NEUTROABS 9.4* 15.9*  HGB 11.5* 10.0*  HCT 37.9 32.7*  MCV 80.3 80.7  PLT 299 429*   Cardiac Enzymes: No results for input(s): "CKTOTAL", "CKMB", "CKMBINDEX", "TROPONINI" in the last 168 hours. BNP: Invalid input(s): "POCBNP" CBG: No results for input(s): "GLUCAP" in the last 168 hours. HbA1C: No results for input(s): "HGBA1C" in the last 72 hours. Urine analysis:    Component Value Date/Time   COLORURINE YELLOW 05/15/2022 2320   APPEARANCEUR HAZY (A) 05/15/2022 2320   LABSPEC 1.017 05/15/2022 2320   PHURINE 7.0 05/15/2022 2320   GLUCOSEU >=500 (A) 05/15/2022 2320   HGBUR NEGATIVE 05/15/2022 2320   BILIRUBINUR NEGATIVE 05/15/2022 2320   KETONESUR  NEGATIVE 05/15/2022 2320   PROTEINUR NEGATIVE 05/15/2022 2320   NITRITE NEGATIVE 05/15/2022 2320   LEUKOCYTESUR NEGATIVE 05/15/2022 2320   Sepsis Labs: '@LABRCNTIP'$ (procalcitonin:4,lacticidven:4) ) Recent Results (from the past 240 hour(s))  Culture, blood (routine x 2)     Status: None (Preliminary result)   Collection Time: 05/15/22  8:52 PM   Specimen: BLOOD RIGHT FOREARM  Result Value Ref Range Status   Specimen Description BLOOD RIGHT FOREARM  Final   Special Requests   Final    BOTTLES DRAWN AEROBIC AND ANAEROBIC Blood Culture adequate volume Performed at [facility], [address] [place], [place] [address]    Culture PENDING  Incomplete   Report Status PENDING  Incomplete  Resp Panel by RT-PCR (Flu A&B, Covid) Anterior Nasal Swab     Status: None   Collection Time: 05/16/22  3:17 AM   Specimen: Anterior Nasal Swab  Result Value Ref Range Status   SARS Coronavirus 2 by RT PCR NEGATIVE NEGATIVE Final    Comment: (NOTE) SARS-CoV-2 target nucleic acids are NOT DETECTED.  The SARS-CoV-2 RNA is generally detectable in upper respiratory specimens during the acute phase of infection. The lowest concentration of SARS-CoV-2 viral copies this assay can detect is 138 copies/mL. A  negative result does not preclude SARS-Cov-2 infection and should not be used as the sole basis for treatment or other patient management decisions. A negative result may occur with  improper specimen collection/handling, submission of specimen other than nasopharyngeal swab, presence of viral mutation(s) within the areas targeted by this assay, and inadequate number of viral copies(<138 copies/mL). A negative result must be combined with clinical observations, patient history, and epidemiological information. The expected result is Negative.  Fact Sheet for Patients:  EntrepreneurPulse.com.au  Fact Sheet for Healthcare Providers:  IncredibleEmployment.be  This  test is no t yet approved or cleared by the [place] FDA and  has been authorized for detection and/or diagnosis of SARS-CoV-2 by FDA under an Emergency Use Authorization (EUA). This EUA will remain  in effect (meaning this test can be used) for the duration of the COVID-19 declaration under Section 564(b)(1) of the Act, 21 U.S.C.section 360bbb-3(b)(1), unless the authorization is terminated  or revoked sooner.       Influenza A by PCR NEGATIVE NEGATIVE Final   Influenza B by PCR NEGATIVE NEGATIVE Final    Comment: (NOTE) The Xpert Xpress SARS-CoV-2/FLU/RSV plus assay is intended as an aid in the diagnosis of influenza from Nasopharyngeal swab specimens and should not be used as a sole basis for treatment. Nasal washings and aspirates are unacceptable for Xpert Xpress SARS-CoV-2/FLU/RSV testing.  Fact Sheet for Patients: EntrepreneurPulse.com.au  Fact Sheet for Healthcare Providers: IncredibleEmployment.be  This test is not yet approved or cleared by the [place] FDA and has been authorized for detection and/or diagnosis of SARS-CoV-2 by FDA under an Emergency Use Authorization (EUA). This EUA will remain in effect (meaning this test can be used) for the duration of the COVID-19 declaration under Section 564(b)(1) of the Act, 21 U.S.C. section 360bbb-3(b)(1), unless the authorization is terminated or revoked.  Performed at [facility], [address], [place], [place] [address]      Scheduled Meds:  aspirin  81 mg Oral Daily   atorvastatin  20 mg Oral Daily   Chlorhexidine Gluconate Cloth  6 each Topical Q0600   DULoxetine  60 mg Oral Daily   enoxaparin (LOVENOX) injection  30 mg Subcutaneous Q24H   insulin aspart  0-5 Units Subcutaneous QHS   insulin aspart  0-9 Units Subcutaneous TID WC   levothyroxine  150 mcg Oral Q0600   linaclotide  290 mcg Oral QAC breakfast   midodrine  10 mg Oral QAC breakfast   vancomycin variable  dose per unstable renal function (pharmacist dosing)   Does not apply See admin instructions   Continuous Infusions:  sodium chloride     sodium chloride 50 mL/hr at 05/16/22 0726   sodium chloride     ceFEPime (MAXIPIME) IV     norepinephrine (LEVOPHED) Adult infusion 2 mcg/min (05/16/22 0726)    Procedures/Studies: CT CHEST ABDOMEN PELVIS WO CONTRAST  Result Date: 05/16/2022 CLINICAL DATA:  Sepsis, weakness, hypotensive and bradycardic. Limited p.o. intake past few days. EXAM: CT CHEST, ABDOMEN AND PELVIS WITHOUT CONTRAST TECHNIQUE: Multidetector CT imaging of the chest, abdomen and pelvis was performed following the standard protocol without IV contrast. RADIATION DOSE REDUCTION: This exam was performed according to the departmental dose-optimization program which includes automated exposure control, adjustment of the mA and/or kV according to patient size and/or use of iterative reconstruction technique. COMPARISON:  Portable chest yesterday and 05/10/2022, thoracic spine CT myelogram images 11/19/17, and abdomen and pelvis CT without and with contrast 08/05/2020. FINDINGS: CT CHEST FINDINGS Cardiovascular:  The heart is slightly enlarged. There is no pericardial effusion. There is patchy three-vessel calcific CAD. The pulmonary trunk is upper limits of normal in caliber with minimal air in the anterior pulmonary trunk likely iatrogenic. There are calcific plaques in the arch and descending aorta, scattered calcifications in the great vessels. There is no aortic aneurysm. There are prominent central pulmonary veins. Mediastinum/Nodes: There are slightly prominent lymph nodes in the subcarinal and low right paratracheal spaces, both measure up to 1.2 cm in short axis. No other intrathoracic adenopathy is seen without contrast. Axillary spaces are clear. The trachea, lower poles of the thyroid are unremarkable. There is a moderate-sized hiatal hernia and mild chronic esophageal thickening unchanged since  2019. Lungs/Pleura: There are bilateral minimal layering pleural effusions. Interlobular septal thickening is seen in the lung apices and bases consistent with interstitial edema. There is diffuse bronchial thickening, scattered linear scar-like opacities in the lung bases no pulmonary mass or confluent consolidation is seen. Bronchi are clear. Musculoskeletal: There is partially visible lower cervical anterior fusion plating. There is osteopenia, thoracic kyphosis and multilevel advanced degenerative disc disease with slight dextroscoliosis. No acute or significant osseous findings. Surgical clips and dystrophic calcifications are noted chronically in the lower medial left breast. No other significant chest wall findings. CT ABDOMEN PELVIS FINDINGS Hepatobiliary: No focal abnormality is seen without contrast. Old cholecystectomy. No biliary dilatation. Pancreas: Diffuse chronic atrophy is seen. No other focal abnormality. Spleen: No focal abnormality is seen without contrast. No splenomegaly. Adrenals/Urinary Tract: There is no adrenal mass. No focal abnormality of unenhanced renal cortex. Bilateral perinephric stranding appears similar. There is no urinary stone or obstruction. The left side of bladder is obscured by metal artifact from left hip replacement. The remainder of the bladder is unremarkable. Stomach/Bowel: There is inflammatory stranding along side the descending and horizontal segments of the duodenum, with thickened folds. Remainder of the small bowel is unremarkable. The appendix is normal caliber. There is moderate stool retention. There are colonic diverticula without evidence of diverticulitis. Vascular/Lymphatic: Aortic atherosclerosis. No enlarged abdominal or pelvic lymph nodes. Reproductive: Status post hysterectomy. No adnexal masses. Other: There is minimal ascites in the abdomen in the perihepatic spaces, posterior to the descending duodenum, and trace amount in the pelvis. There is mild  body wall anasarca. There is no free air, hemorrhage or abscess. Musculoskeletal: Left hip replacement. Mild lumbar levoscoliosis. Osteopenia and degenerative change lumbar spine most advanced at L4-5 and L5-S1. No concerning regional bone lesion. IMPRESSION: 1. Mild cardiomegaly with prominent central pulmonary veins, interstitial edema in the lung bases and apices and minimal pleural effusions. Findings consistent with mild CHF or fluid overload. 2. Diffuse bronchial thickening which could indicate bronchitis or congestive bronchial thickening. No evidence of bronchopneumonia. 3. A few slightly prominent mediastinal nodes. No bulky or encasing adenopathy. 4. Aortic and coronary artery atherosclerosis and upper limit of normal pulmonary trunk caliber. 5. Minimal air in the pulmonary trunk likely iatrogenic. 6. Moderate-sized hiatal hernia. 7. Paraduodenal stranding most likely due to duodenitis or inflamed ulcerative disease. Not likely related to the diffusely atrophic pancreas but clinical correlation advised. No free air or abscess. 8. Constipation with diverticulosis. 9. Minimal ascites.  Mild body wall edema. Electronically Signed   By: Telford Nab M.D.   On: 05/16/2022 01:38   DG Chest Port 1 View  Result Date: 05/15/2022 CLINICAL DATA:  Weakness, hypotension, bradycardia EXAM: PORTABLE CHEST 1 VIEW COMPARISON:  05/10/2022 FINDINGS: Lungs volumes are small, but are symmetric and are  clear. No pneumothorax or pleural effusion. Cardiac size within normal limits. Pulmonary vascularity is normal. Osseous structures are age-appropriate. No acute bone abnormality. IMPRESSION: No active disease. Electronically Signed   By: Fidela Salisbury M.D.   On: 05/15/2022 21:19   DG Chest Port 1 View  Result Date: 05/10/2022 CLINICAL DATA:  Weakness and shortness of breath EXAM: PORTABLE CHEST 1 VIEW COMPARISON:  05/02/2021 FINDINGS: Shallow inspiration. Heart size and pulmonary vascularity are normal for technique.  Lungs are clear. No pleural effusions. No pneumothorax. Mediastinal contours appear intact. Probable esophageal hiatal hernia behind the heart. Postoperative changes in the cervical spine. Degenerative changes in the left upper quadrant. Degenerative changes in the shoulders and spine. IMPRESSION: Shallow inspiration.  No evidence of active pulmonary disease. Electronically Signed   By: Lucienne Capers M.D.   On: 05/10/2022 23:38   CT HEAD WO CONTRAST (5MM)  Result Date: 05/10/2022 CLINICAL DATA:  Mental status change of unknown cause. Weakness over 1 year, worse in the last few weeks. Unwitnessed fall yesterday. Unsteadiness, dizziness, and confusion. EXAM: CT HEAD WITHOUT CONTRAST TECHNIQUE: Contiguous axial images were obtained from the base of the skull through the vertex without intravenous contrast. RADIATION DOSE REDUCTION: This exam was performed according to the departmental dose-optimization program which includes automated exposure control, adjustment of the mA and/or kV according to patient size and/or use of iterative reconstruction technique. COMPARISON:  09/29/2020 FINDINGS: Brain: Diffuse cerebral atrophy. Ventricular dilatation could be due to central atrophy or normal pressure hydrocephalus. No change since prior study. Low-attenuation changes in the deep white matter consistent with small vessel ischemia. No abnormal extra-axial fluid collections. No mass effect or midline shift. Gray-white matter junctions are distinct. Basal cisterns are not effaced. No acute intracranial hemorrhage. Vascular: No hyperdense vessel or unexpected calcification. Skull: Normal. Negative for fracture or focal lesion. Sinuses/Orbits: Paranasal sinuses and mastoid air cells are clear. Other: None. IMPRESSION: No acute intracranial abnormalities. Chronic atrophy and small vessel ischemic changes. Ventricular dilatation, likely central atrophy but could indicate normal pressure hydrocephalus in the appropriate  clinical setting. No change since prior study. Electronically Signed   By: Lucienne Capers M.D.   On: 05/10/2022 23:28    Orson Eva, DO  [facility]  If 7PM-7AM, please contact night-coverage [email or web address] Password [email or web address] 05/16/2022, 7:41 AM   LOS: 0 days

## 2022-05-16 NOTE — Inpatient Diabetes Management (Signed)
Inpatient Diabetes Program Recommendations  AACE/ADA: New Consensus Statement on Inpatient Glycemic Control (2015)  Target Ranges:  Prepandial:   less than 140 mg/dL      Peak postprandial:   less than 180 mg/dL (1-2 hours)      Critically ill patients:  140 - 180 mg/dL   Lab Results  Component Value Date   GLUCAP 236 (H) 05/16/2022   HGBA1C 10.8 (H) 05/15/2022    Review of Glycemic Control  Latest Reference Range & Units 05/16/22 08:00  Glucose-Capillary 70 - 99 mg/dL 236 (H)  (H): Data is abnormally high  Diabetes history: DM2 Outpatient Diabetes medications:  Lantus 20-25 units QD Novolog 0-20 units QHS Metformin 1000 mg BID Current orders for Inpatient glycemic control:  Novolog 0-9 units TID and 0-5 units QHS, Decadron 10 mg x 1 on 8/8  Inpatient Diabetes Program Recommendations:    Please consider:  Semglee 12 units QD.  Will continue to follow while inpatient.  Thank you, Reche Dixon, MSN, [place] Diabetes Coordinator Inpatient Diabetes Program [phone] (team pager from 8a-5p)

## 2022-05-16 NOTE — ED Notes (Signed)
LACTIC 2.0

## 2022-05-16 NOTE — Assessment & Plan Note (Addendum)
Initially confused and agitated Secondary to sepsis and acute kidney injury Has underlying cognitive impairment --overall improved, now at baseline

## 2022-05-16 NOTE — Assessment & Plan Note (Addendum)
Holding ramipril amlodipine and metoprolol succinate secondary to hypotension Family states that the patient has not been taking her medications properly anyway BP remains soft but stable with midodrine Echo EF 60-65%, no WMA, G1DD, mild MR

## 2022-05-16 NOTE — Progress Notes (Signed)
Patient is having increased confusion and hostility towards staff members.  Patient has attempted to get out of bed multiple times stating "I'm going home, my husband is here to pick me up" patient is not easily reoriented and is interfering with care with multiple interventions from staff. Patient has pulled out three IV's since admission and has limited vascular access. Attempting another IV at this time.

## 2022-05-16 NOTE — Progress Notes (Signed)
Weaned patient to room air this morning from 3L nasal cannula. Pt was remaining stable on room air. After about an hour on room air patient was staying between 88-90% on room air.  2L nasal cannula applied, will continue to try and wean as appropriate.

## 2022-05-16 NOTE — Assessment & Plan Note (Signed)
repleted ?

## 2022-05-16 NOTE — Progress Notes (Signed)
Pharmacy Antibiotic Note  Jeanne Kim is a 81 y.o. female admitted on 05/15/2022 with sepsis.  Pharmacy has been consulted for vancomycin and cefepime dosing.  Pt w/ AKI; SCr 1.68, baseline <1.  Plan: Vancomycin '1500mg'$  IV x1; monitor SCr +/- vanc levels prior to redosing. Cefepime 2g IV Q24H.  Height: '5\' 5"'$  (165.1 cm) Weight: 74 kg (163 lb 2.3 oz) IBW/kg (Calculated) : 57  Temp (24hrs), Avg:97 F (36.1 C), Min:96.6 F (35.9 C), Max:97.4 F (36.3 C)  Recent Labs  Lab 05/11/22 0002 05/15/22 2047 05/16/22 0146  WBC 11.2* 21.7*  --   CREATININE 0.76 1.68*  --   LATICACIDVEN  --  2.2* 2.0*    Estimated Creatinine Clearance: 27.3 mL/min (A) (by C-G formula based on SCr of 1.68 mg/dL (H)).    Allergies  Allergen Reactions   Codeine Hives   Sulfonamide Derivatives Hives   Canagliflozin    Penicillins Hives    Pt states she does NOT have allergy to penicillin   Sulfa Antibiotics Other (See Comments)    unknown     Thank you for allowing pharmacy to be a part of this patient's care.  Wynona Neat, PharmD, BCPS  05/16/2022 4:24 AM

## 2022-05-16 NOTE — Assessment & Plan Note (Addendum)
NovoLog sliding scale 05/17/22  hemoglobin A1c--10.4 Discontinued metformin Resuming home regimen

## 2022-05-16 NOTE — TOC Initial Note (Signed)
Transition of Care [facility]) - Initial/Assessment Note    Patient Details  Name: Jeanne Kim MRN: [id number] Date of Birth: 02-Jan-1941  [facility] CM/SW Contact:    Iona Beard, [place] Phone Number: 05/16/2022, 11:27 AM  Clinical Narrative:                 Pt is high risk for readmission. CSW spoke with pts husband to complete assessment. Pt lives with her husband. Pt is mostly independent in completing ADLs however her children do assist in the home when needed. Pt has transportation when needed. Pt has had HH in the past. Pt has a walker and wheelchair to use when needed. TOC to follow.   Expected Discharge Plan: [place] Barriers to Discharge: Continued Medical Work up   Patient Goals and CMS Choice Patient states their goals for this hospitalization and ongoing recovery are:: return home CMS Medicare.gov Compare Post Acute Care list provided to:: Patient Choice offered to / list presented to : Patient  Expected Discharge Plan and Services Expected Discharge Plan: [place] In-house Referral: Clinical Social Work Discharge Planning Services: CM Consult Post Acute Care Choice: [place] arrangements for the past 2 months: Single Family Home                                      Prior Living Arrangements/Services Living arrangements for the past 2 months: Single Family Home Lives with:: Spouse Patient language and need for interpreter reviewed:: Yes Do you feel safe going back to the place where you live?: Yes      Need for Family Participation in Patient Care: Yes (Comment) Care giver support system in place?: Yes (comment)   Criminal Activity/Legal Involvement Pertinent to Current Situation/Hospitalization: No - Comment as needed  Activities of Daily Living Home Assistive Devices/Equipment: [place] Rile (specify type) ADL Screening (condition at time of admission) Patient's cognitive ability adequate to  safely complete daily activities?: Yes Is the patient deaf or have difficulty hearing?: No Does the patient have difficulty seeing, even when wearing glasses/contacts?: No Does the patient have difficulty concentrating, remembering, or making decisions?: Yes Patient able to express need for assistance with ADLs?: Yes Does the patient have difficulty dressing or bathing?: No Independently performs ADLs?: Yes (appropriate for developmental age) Does the patient have difficulty walking or climbing stairs?: Yes Weakness of Legs: None Weakness of Arms/Hands: None  Permission Sought/Granted                  Emotional Assessment Appearance:: Appears stated age Attitude/Demeanor/Rapport: Engaged Affect (typically observed): Accepting Orientation: : Oriented to Self, Oriented to Place, Oriented to  Time, Oriented to Situation Alcohol / Substance Use: Not Applicable Psych Involvement: No (comment)  Admission diagnosis:  Sepsis [facility]) [A41.9] Patient Active Problem List   Diagnosis Date Noted   Sepsis due to undetermined organism ([place]) 78/29/5621   Acute metabolic encephalopathy 30/86/5784   Generalized weakness 05/04/2021   Hypoalbuminemia due to protein-calorie malnutrition ([place]) 05/04/2021   Hyperlipidemia 05/04/2021   Prolonged QT interval 05/04/2021   Symptomatic anemia 12/23/2020   GI bleeding 12/20/2020   Hypokalemia due to excessive gastrointestinal loss of potassium 10/31/2020   Hyperbilirubinemia 10/31/2020   Thrombocytosis 10/31/2020   Hyperglycemia due to diabetes mellitus ([place]) 10/31/2020   Abdominal pain 10/04/2020   Fibromyalgia 10/04/2020   Hyperglycemia due to type 2 diabetes  mellitus ([place]) 10/04/2020   Proctocolitis 10/04/2020   Gastric ulcer 10/04/2020   History of gastrointestinal bleeding 10/04/2020   Intracranial bleed ([place]) 09/28/2020   AKI (acute kidney injury) ([place]) 09/28/2020   Upper GI bleed 08/05/2020   Hypokalemia 03/25/2020   Chronic pain  03/22/2020   Hypomagnesemia 03/22/2020   Insomnia 03/22/2020   MDD (major depressive disorder) 03/22/2020   Status post total replacement of left hip 03/21/2020   S/P musculoskeletal system surgery 03/21/2020   At risk for falls 02/24/2020   Chronic, continuous use of opioids 02/24/2020   Senile osteoporosis 02/24/2020   History of radiation therapy 02/24/2020   Elevated white blood cell count, unspecified 02/06/2020   Microcytic anemia 02/05/2020   Electrolyte abnormality 02/05/2020   Lower extremity edema 02/04/2020   Pathological fracture of left hip due to age-related osteoporosis ([place]) 02/04/2020   History of fracture of left hip 02/03/2020   Fecal incontinence    Swelling of limb-Bilateral leg 04/29/2014   Weakness of both legs 04/29/2014   Dizziness and giddiness 04/29/2014   Malignant neoplasm of breast ([place]) 07/02/2013   Occlusion and stenosis of carotid artery without mention of cerebral infarction 04/28/2013   Carotid stenosis 01/20/2013   Autonomic orthostatic hypotension 10/17/2012   Postural hypotension 10/17/2012   Dyspnea 08/27/2012   Syncope 02/17/2012   Head trauma 02/14/2012   Diaphoresis 02/05/2012   Unspecified constipation 12/12/2011   Personal history of colonic polyps 12/12/2011   Family history of malignant neoplasm of gastrointestinal tract 12/12/2011   Diverticulosis of colon 12/12/2011   Gastritis 12/12/2011   GERD with stricture 12/12/2011   Gastroesophageal reflux disease 11/27/2011   Constipation 11/27/2011   IBS (irritable bowel syndrome) 11/27/2011   Bacterial overgrowth syndrome 11/27/2011   S/P cholecystectomy 11/27/2011   Adult hypothyroidism 07/13/2011   ADENOCARCINOMA, LEFT BREAST 12/09/2009   CHEST PAIN-PRECORDIAL 12/09/2009   History of breast cancer 12/09/2009   Hypothyroidism 04/27/2009   DIABETES MELLITUS 04/07/2009   HYPERCHOLESTEROLEMIA  IIA 04/07/2009   OVERWEIGHT/OBESITY 04/07/2009   Essential hypertension 04/07/2009    CAD (coronary artery disease) 04/07/2009   Irritable bowel syndrome 04/07/2009   DEGENERATIVE JOINT DISEASE 04/07/2009   DEGENERATIVE [place] DISEASE 04/07/2009   FIBROMYALGIA 04/07/2009   Uncontrolled type 2 diabetes mellitus with hyperglycemia, with long-term current use of insulin ([place]) 05/04/2008   Thyroid nodule 05/04/2008   PCP:  Neale Burly, MD Pharmacy:   [id number] [place], [id number]: [phone]: [phone]    Social Determinants of Health (SDOH) Interventions    Readmission Risk Interventions    05/16/2022   11:25 AM  Readmission Risk Prevention Plan  Transportation Screening Complete  Medication Review (RN Care Manager) Complete  HRI or Home Care Consult Complete  SW Recovery Care/Counseling Consult Complete  Palliative Care Screening Not Applicable  Skilled Nursing Facility Not Applicable

## 2022-05-17 DIAGNOSIS — R6510 Systemic inflammatory response syndrome (SIRS) of non-infectious origin without acute organ dysfunction: Secondary | ICD-10-CM

## 2022-05-17 DIAGNOSIS — N179 Acute kidney failure, unspecified: Secondary | ICD-10-CM | POA: Diagnosis not present

## 2022-05-17 DIAGNOSIS — E876 Hypokalemia: Secondary | ICD-10-CM | POA: Diagnosis not present

## 2022-05-17 DIAGNOSIS — G9341 Metabolic encephalopathy: Secondary | ICD-10-CM | POA: Diagnosis not present

## 2022-05-17 LAB — BRAIN NATRIURETIC PEPTIDE: B Natriuretic Peptide: 687 pg/mL — ABNORMAL HIGH (ref 0.0–100.0)

## 2022-05-17 LAB — URINE CULTURE: Culture: NO GROWTH

## 2022-05-17 LAB — COMPREHENSIVE METABOLIC PANEL
ALT: 36 U/L (ref 0–44)
AST: 33 U/L (ref 15–41)
Albumin: 3.2 g/dL — ABNORMAL LOW (ref 3.5–5.0)
Alkaline Phosphatase: 87 U/L (ref 38–126)
Anion gap: 10 (ref 5–15)
BUN: 24 mg/dL — ABNORMAL HIGH (ref 8–23)
CO2: 24 mmol/L (ref 22–32)
Calcium: 8.1 mg/dL — ABNORMAL LOW (ref 8.9–10.3)
Chloride: 97 mmol/L — ABNORMAL LOW (ref 98–111)
Creatinine, Ser: 0.95 mg/dL (ref 0.44–1.00)
GFR, Estimated: >60 mL/min (ref >60)
Glucose, Bld: 273 mg/dL — ABNORMAL HIGH (ref 70–99)
Potassium: 3.1 mmol/L — ABNORMAL LOW (ref 3.5–5.1)
Sodium: 131 mmol/L — ABNORMAL LOW (ref 135–145)
Total Bilirubin: 0.9 mg/dL (ref 0.3–1.2)
Total Protein: 6.5 g/dL (ref 6.5–8.1)

## 2022-05-17 LAB — MAGNESIUM: Magnesium: 1.7 mg/dL (ref 1.7–2.4)

## 2022-05-17 LAB — CBC
HCT: 31.3 % — ABNORMAL LOW (ref 36.0–46.0)
Hemoglobin: 10 g/dL — ABNORMAL LOW (ref 12.0–15.0)
MCH: 25.1 pg — ABNORMAL LOW (ref 26.0–34.0)
MCHC: 31.9 g/dL (ref 30.0–36.0)
MCV: 78.4 fL — ABNORMAL LOW (ref 80.0–100.0)
Platelets: 458 10*3/uL — ABNORMAL HIGH (ref 150–400)
RBC: 3.99 MIL/uL (ref 3.87–5.11)
RDW: 16 % — ABNORMAL HIGH (ref 11.5–15.5)
WBC: 29.6 10*3/uL — ABNORMAL HIGH (ref 4.0–10.5)
nRBC: 0 % (ref 0.0–0.2)

## 2022-05-17 LAB — GLUCOSE, CAPILLARY
Glucose-Capillary: 234 mg/dL — ABNORMAL HIGH (ref 70–99)
Glucose-Capillary: 301 mg/dL — ABNORMAL HIGH (ref 70–99)
Glucose-Capillary: 279 mg/dL — ABNORMAL HIGH (ref 70–99)
Glucose-Capillary: 203 mg/dL — ABNORMAL HIGH (ref 70–99)

## 2022-05-17 LAB — PROCALCITONIN: Procalcitonin: <0.1 ng/mL

## 2022-05-17 LAB — HEMOGLOBIN A1C
Hgb A1c MFr Bld: 10.4 % — ABNORMAL HIGH (ref 4.8–5.6)
Mean Plasma Glucose: 251.78 mg/dL

## 2022-05-17 LAB — T3, FREE: T3, Free: 3.3 pg/mL (ref 2.0–4.4)

## 2022-05-17 MED ORDER — MIDODRINE HCL 5 MG PO TABS
10.0000 mg | ORAL_TABLET | Freq: Three times a day (TID) | ORAL | Status: DC
Start: 2022-05-17 — End: 2022-05-18
  Administered 2022-05-17 – 2022-05-18 (×5): 10 mg via ORAL
  Filled 2022-05-17 (×5): qty 2

## 2022-05-17 MED ORDER — POTASSIUM CHLORIDE CRYS ER 20 MEQ PO TBCR
40.0000 meq | EXTENDED_RELEASE_TABLET | Freq: Once | ORAL | Status: AC
  Administered 2022-05-17: 40 meq via ORAL
  Filled 2022-05-17: qty 2

## 2022-05-17 MED ORDER — CEFEPIME HCL 2 G IV SOLR
2.0000 g | Freq: Two times a day (BID) | INTRAVENOUS | Status: DC
  Administered 2022-05-17: 2 g via INTRAVENOUS
  Filled 2022-05-17: qty 12.5

## 2022-05-17 MED ORDER — VANCOMYCIN HCL 1250 MG/250ML IV SOLN
1250.0000 mg | INTRAVENOUS | Status: DC
  Administered 2022-05-17: 1250 mg via INTRAVENOUS
  Filled 2022-05-17: qty 250

## 2022-05-17 MED ORDER — INSULIN GLARGINE-YFGN 100 UNIT/ML ~~LOC~~ SOLN
20.0000 [IU] | Freq: Every day | SUBCUTANEOUS | Status: DC
Start: 2022-05-18 — End: 2022-05-22
  Administered 2022-05-18 – 2022-05-22 (×5): 20 [IU] via SUBCUTANEOUS
  Filled 2022-05-17 (×6): qty 0.2

## 2022-05-17 MED ORDER — INSULIN ASPART 100 UNIT/ML IJ SOLN
3.0000 [IU] | Freq: Three times a day (TID) | INTRAMUSCULAR | Status: DC
Start: 2022-05-17 — End: 2022-05-22
  Administered 2022-05-17 – 2022-05-22 (×13): 3 [IU] via SUBCUTANEOUS

## 2022-05-17 MED ORDER — MAGNESIUM SULFATE 2 GM/50ML IV SOLN
2.0000 g | Freq: Once | INTRAVENOUS | Status: AC
  Administered 2022-05-17: 2 g via INTRAVENOUS
  Filled 2022-05-17: qty 50

## 2022-05-17 NOTE — Progress Notes (Signed)
PROGRESS NOTE  Jeanne Kim IRJ:[id number] DOB: 12-16-1940 DOA: 05/15/2022 PCP: Neale Burly, MD  Brief History:  81 year old female with a history of diabetes mellitus, hypertension, hyperlipidemia, hypothyroidism, coronary artery disease presenting with approximately 1 week history of generalized weakness and decreased oral intake and decreased urine output.  Unfortunately, the patient is a poor historian secondary to some cognitive impairment and acute encephalopathy.  Nevertheless, the patient denies any chest pain, shortness breath, abdominal pain, nausea, vomiting, diarrhea, abdominal pain. In the ED, the patient was noted to be hypothermic with a temperature of 96.6 F.  She was hypotensive with blood pressure 83/36.  She was started on Levophed.  WBC 21.7, hemoglobin 10.0, platelets 429,000.  Sodium 128, potassium 4.3, bicarbonate 24, serum creatinine 1.68.  TSH 9.865.  CT of the chest abdomen pelvis showed some interlobular septal thickening.  There is no cardiomegaly and prominent central pulmonary veins.  There is diffuse bronchial wall thickening.  There was no consolidation.  There is periduodenal stranding without any free air or ascites.  UA was negative for pyuria.  The patient was started vancomycin and cefepime and IV fluids.  She was admitted for further evaluation and treatment.  She was initially started on Levophed.    Assessment and Plan: * SIRS (systemic inflammatory response syndrome) (HCC) Presented with hypotension, tachycardia, and leukocytosis Sepsis ruled out Personally reviewed chest x-ray--no consolidation UA without pyuria Follow blood cultures--neg to date Discontinue empiric vancomycin and cefepime Initially on IV fluids Lactic acid peaked 2.2 Check PCT <0.10 x 2 BP remains soft>>continue midodrine  Acute metabolic encephalopathy Initially confused and agitated Secondary to sepsis and acute kidney injury Has underlying cognitive  impairment --overall improved, now near baseline  AKI (acute kidney injury) ([place]) Baseline creatinine 0.7-1.0 Presented with serum creatinine 1.68 Secondary to volume depletion and hypotension Holding ramipril and metolazone Holding furosemide Improved with IVF  Hypomagnesemia repleted  Hypokalemia replete  Leukemoid reaction Add diff to CBC Pt received dexamathasone 05/15/22 at 2353  Uncontrolled type 2 diabetes mellitus with hyperglycemia, with long-term current use of insulin (HCC) NovoLog sliding scale 05/17/22  hemoglobin A1c--10.4 Increase semglee to 20 units Holding metformin Add novolog 3 units with meals  CAD (coronary artery disease) No chest pain presently Personally reviewed EKG--junctional rhythm, no ST-T wave changes Repeat EKG  Essential hypertension Holding ramipril amlodipine and metoprolol succinate secondary to hypotension Family states that the patient has not been taking her medications properly anyway BP remains soft Echo  Hypothyroidism There has been concern that the patient has not been taking her medications at home TSH 9.865 Restart levothyroxine    Family Communication:   daughter updated 42/10   Consultants:  none   Code Status:  FULL   DVT Prophylaxis:  [place] Lovenox     Procedures: As Listed in Progress Note Above   Antibiotics: Vanc 8/9>.8/10 Cefepime 8/9>8/10    Total time spent 50 minutes.  Greater than 50% spent face to face counseling and coordinating care.    Subjective: Patient denies fevers, chills, headache, chest pain, dyspnea, nausea, vomiting, diarrhea, abdominal pain, dysuria, hematuria, hematochezia, and melena.   Objective: Vitals:   05/17/22 1330 05/17/22 1400 05/17/22 1430 05/17/22 1500  BP: (!) 107/44 (!) 127/43 (!) 107/45 (!) 108/39  Pulse: 63 65 62 64  Resp: 19 20 (!) 23 (!) 21  Temp:      TempSrc:      SpO2: 92% 98% 100% 97%  Weight:      Height:        Intake/Output Summary (Last 24  hours) at 05/17/2022 1531 Last data filed at 05/17/2022 1505 Gross per 24 hour  Intake 1035.84 ml  Output 3250 ml  Net -2214.16 ml   Weight change: 0.4 kg Exam:  General:  Pt is alert, follows commands appropriately, not in acute distress HEENT: No icterus, No thrush, No neck mass, [place]/AT Cardiovascular: RRR, S1/S2, no rubs, no gallops Respiratory: bibasilar rales.  No wheeze Abdomen: Soft/+BS, non tender, non distended, no guarding Extremities: trace LE edema, No lymphangitis, No petechiae, No rashes, no synovitis   Data Reviewed: I have personally reviewed following labs and imaging studies Basic Metabolic Panel: Recent Labs  Lab 05/11/22 0002 05/15/22 2047 05/15/22 2130 05/16/22 0730 05/17/22 0343  NA 130* 128*  --  131* 131*  K 3.9 4.3  --  4.1 3.1*  CL 96* 92*  --  94* 97*  CO2 23 24  --  24 24  GLUCOSE 215* 240*  --  234* 273*  BUN 14 34*  --  30* 24*  CREATININE 0.76 1.68*  --  1.30* 0.95  CALCIUM 8.2* 8.3*  --  8.5* 8.1*  MG  --   --  1.4* 1.6* 1.7   Liver Function Tests: Recent Labs  Lab 05/11/22 0002 05/15/22 2047 05/16/22 0730 05/17/22 0343  AST 19 81* 83* 33  ALT 16 40 57* 36  ALKPHOS 72 86 100 87  BILITOT 1.5* 1.7* 1.8* 0.9  PROT 7.0 6.6 7.0 6.5  ALBUMIN 3.7 3.4* 3.4* 3.2*   No results for input(s): "LIPASE", "AMYLASE" in the last 168 hours. Recent Labs  Lab 05/11/22 0002  AMMONIA 11   Coagulation Profile: No results for input(s): "INR", "PROTIME" in the last 168 hours. CBC: Recent Labs  Lab 05/11/22 0002 05/15/22 2047 05/16/22 0730 05/17/22 0343  WBC 11.2* 21.7* 21.4* 29.6*  NEUTROABS 9.4* 15.9*  --   --   HGB 11.5* 10.0* 11.2* 10.0*  HCT 37.9 32.7* 35.8* 31.3*  MCV 80.3 80.7 79.4* 78.4*  PLT 299 429* 515* 458*   Cardiac Enzymes: No results for input(s): "CKTOTAL", "CKMB", "CKMBINDEX", "TROPONINI" in the last 168 hours. BNP: Invalid input(s): "POCBNP" CBG: Recent Labs  Lab 05/16/22 1137 05/16/22 1631 05/16/22 2109  05/17/22 0733 05/17/22 1119  GLUCAP 201* 252* 281* 234* 301*   HbA1C: Recent Labs    05/15/22 2104 05/17/22 0343  HGBA1C 10.8* 10.4*   Urine analysis:    Component Value Date/Time   COLORURINE YELLOW 05/15/2022 2320   APPEARANCEUR HAZY (A) 05/15/2022 2320   LABSPEC 1.017 05/15/2022 2320   PHURINE 7.0 05/15/2022 2320   GLUCOSEU >=500 (A) 05/15/2022 2320   HGBUR NEGATIVE 05/15/2022 2320   BILIRUBINUR NEGATIVE 05/15/2022 2320   KETONESUR NEGATIVE 05/15/2022 2320   PROTEINUR NEGATIVE 05/15/2022 2320   NITRITE NEGATIVE 05/15/2022 2320   LEUKOCYTESUR NEGATIVE 05/15/2022 2320   Sepsis Labs: '@LABRCNTIP'$ (procalcitonin:4,lacticidven:4) ) Recent Results (from the past 240 hour(s))  Culture, blood (routine x 2)     Status: None (Preliminary result)   Collection Time: 05/15/22  8:52 PM   Specimen: BLOOD RIGHT FOREARM  Result Value Ref Range Status   Specimen Description BLOOD RIGHT FOREARM  Final   Special Requests   Final    BOTTLES DRAWN AEROBIC AND ANAEROBIC Blood Culture adequate volume   Culture   Final    NO GROWTH 2 DAYS Performed at [facility], [address], [place], [place] [address]  Report Status PENDING  Incomplete  Urine Culture     Status: None   Collection Time: 05/15/22 11:20 PM   Specimen: In/Out Cath Urine  Result Value Ref Range Status   Specimen Description   Final    IN/OUT CATH URINE Performed at [facility], [address] [place], [place] [address]    Special Requests   Final    NONE Performed at [facility], [address], [place], [place] [address]    Culture   Final    NO GROWTH Performed at [facility], [place] [address] [place], [place] [address]    Report Status 05/17/2022 FINAL  Final  Resp Panel by RT-PCR (Flu A&B, Covid) Anterior Nasal Swab     Status: None   Collection Time: 05/16/22  3:17 AM   Specimen: Anterior Nasal Swab  Result Value Ref Range Status   SARS Coronavirus 2 by RT PCR NEGATIVE NEGATIVE Final    Comment:  (NOTE) SARS-CoV-2 target nucleic acids are NOT DETECTED.  The SARS-CoV-2 RNA is generally detectable in upper respiratory specimens during the acute phase of infection. The lowest concentration of SARS-CoV-2 viral copies this assay can detect is 138 copies/mL. A negative result does not preclude SARS-Cov-2 infection and should not be used as the sole basis for treatment or other patient management decisions. A negative result may occur with  improper specimen collection/handling, submission of specimen other than nasopharyngeal swab, presence of viral mutation(s) within the areas targeted by this assay, and inadequate number of viral copies(<138 copies/mL). A negative result must be combined with clinical observations, patient history, and epidemiological information. The expected result is Negative.  Fact Sheet for Patients:  EntrepreneurPulse.com.au  Fact Sheet for Healthcare Providers:  IncredibleEmployment.be  This test is no t yet approved or cleared by the [place] FDA and  has been authorized for detection and/or diagnosis of SARS-CoV-2 by FDA under an Emergency Use Authorization (EUA). This EUA will remain  in effect (meaning this test can be used) for the duration of the COVID-19 declaration under Section 564(b)(1) of the Act, 21 U.S.C.section 360bbb-3(b)(1), unless the authorization is terminated  or revoked sooner.       Influenza A by PCR NEGATIVE NEGATIVE Final   Influenza B by PCR NEGATIVE NEGATIVE Final    Comment: (NOTE) The Xpert Xpress SARS-CoV-2/FLU/RSV plus assay is intended as an aid in the diagnosis of influenza from Nasopharyngeal swab specimens and should not be used as a sole basis for treatment. Nasal washings and aspirates are unacceptable for Xpert Xpress SARS-CoV-2/FLU/RSV testing.  Fact Sheet for Patients: EntrepreneurPulse.com.au  Fact Sheet for Healthcare  Providers: IncredibleEmployment.be  This test is not yet approved or cleared by the [place] FDA and has been authorized for detection and/or diagnosis of SARS-CoV-2 by FDA under an Emergency Use Authorization (EUA). This EUA will remain in effect (meaning this test can be used) for the duration of the COVID-19 declaration under Section 564(b)(1) of the Act, 21 U.S.C. section 360bbb-3(b)(1), unless the authorization is terminated or revoked.  Performed at [facility], [address], [place], [place] [address]   MRSA Next Gen by PCR, Nasal     Status: None   Collection Time: 05/16/22 11:02 AM   Specimen: Nasal Mucosa; Nasal Swab  Result Value Ref Range Status   MRSA by PCR Next Gen NOT DETECTED NOT DETECTED Final    Comment: (NOTE) The GeneXpert MRSA Assay (FDA approved for NASAL specimens only), is one component of a  comprehensive MRSA colonization surveillance program. It is not intended to diagnose MRSA infection nor to guide or monitor treatment for MRSA infections. Test performance is not FDA approved in patients less than 63 years old. Performed at [facility], [address], [place], [place] [address]      Scheduled Meds:  aspirin  81 mg Oral Daily   atorvastatin  20 mg Oral Daily   Chlorhexidine Gluconate Cloth  6 each Topical Q0600   DULoxetine  60 mg Oral Daily   enoxaparin (LOVENOX) injection  40 mg Subcutaneous Q24H   insulin aspart  0-5 Units Subcutaneous QHS   insulin aspart  0-9 Units Subcutaneous TID WC   insulin glargine-yfgn  12 Units Subcutaneous Daily   levothyroxine  150 mcg Oral Q0600   linaclotide  290 mcg Oral QAC breakfast   midodrine  10 mg Oral TID WC   potassium chloride  40 mEq Oral Once   Continuous Infusions:  magnesium sulfate bolus IVPB     norepinephrine (LEVOPHED) Adult infusion Stopped (05/17/22 0718)    Procedures/Studies: CT CHEST ABDOMEN PELVIS WO CONTRAST  Result Date: 05/16/2022 CLINICAL DATA:  Sepsis,  weakness, hypotensive and bradycardic. Limited p.o. intake past few days. EXAM: CT CHEST, ABDOMEN AND PELVIS WITHOUT CONTRAST TECHNIQUE: Multidetector CT imaging of the chest, abdomen and pelvis was performed following the standard protocol without IV contrast. RADIATION DOSE REDUCTION: This exam was performed according to the departmental dose-optimization program which includes automated exposure control, adjustment of the mA and/or kV according to patient size and/or use of iterative reconstruction technique. COMPARISON:  Portable chest yesterday and 05/10/2022, thoracic spine CT myelogram images 11/19/17, and abdomen and pelvis CT without and with contrast 08/05/2020. FINDINGS: CT CHEST FINDINGS Cardiovascular: The heart is slightly enlarged. There is no pericardial effusion. There is patchy three-vessel calcific CAD. The pulmonary trunk is upper limits of normal in caliber with minimal air in the anterior pulmonary trunk likely iatrogenic. There are calcific plaques in the arch and descending aorta, scattered calcifications in the great vessels. There is no aortic aneurysm. There are prominent central pulmonary veins. Mediastinum/Nodes: There are slightly prominent lymph nodes in the subcarinal and low right paratracheal spaces, both measure up to 1.2 cm in short axis. No other intrathoracic adenopathy is seen without contrast. Axillary spaces are clear. The trachea, lower poles of the thyroid are unremarkable. There is a moderate-sized hiatal hernia and mild chronic esophageal thickening unchanged since 2019. Lungs/Pleura: There are bilateral minimal layering pleural effusions. Interlobular septal thickening is seen in the lung apices and bases consistent with interstitial edema. There is diffuse bronchial thickening, scattered linear scar-like opacities in the lung bases no pulmonary mass or confluent consolidation is seen. Bronchi are clear. Musculoskeletal: There is partially visible lower cervical anterior  fusion plating. There is osteopenia, thoracic kyphosis and multilevel advanced degenerative disc disease with slight dextroscoliosis. No acute or significant osseous findings. Surgical clips and dystrophic calcifications are noted chronically in the lower medial left breast. No other significant chest wall findings. CT ABDOMEN PELVIS FINDINGS Hepatobiliary: No focal abnormality is seen without contrast. Old cholecystectomy. No biliary dilatation. Pancreas: Diffuse chronic atrophy is seen. No other focal abnormality. Spleen: No focal abnormality is seen without contrast. No splenomegaly. Adrenals/Urinary Tract: There is no adrenal mass. No focal abnormality of unenhanced renal cortex. Bilateral perinephric stranding appears similar. There is no urinary stone or obstruction. The left side of bladder is obscured by metal artifact from left hip replacement. The remainder of the bladder is  unremarkable. Stomach/Bowel: There is inflammatory stranding along side the descending and horizontal segments of the duodenum, with thickened folds. Remainder of the small bowel is unremarkable. The appendix is normal caliber. There is moderate stool retention. There are colonic diverticula without evidence of diverticulitis. Vascular/Lymphatic: Aortic atherosclerosis. No enlarged abdominal or pelvic lymph nodes. Reproductive: Status post hysterectomy. No adnexal masses. Other: There is minimal ascites in the abdomen in the perihepatic spaces, posterior to the descending duodenum, and trace amount in the pelvis. There is mild body wall anasarca. There is no free air, hemorrhage or abscess. Musculoskeletal: Left hip replacement. Mild lumbar levoscoliosis. Osteopenia and degenerative change lumbar spine most advanced at L4-5 and L5-S1. No concerning regional bone lesion. IMPRESSION: 1. Mild cardiomegaly with prominent central pulmonary veins, interstitial edema in the lung bases and apices and minimal pleural effusions. Findings  consistent with mild CHF or fluid overload. 2. Diffuse bronchial thickening which could indicate bronchitis or congestive bronchial thickening. No evidence of bronchopneumonia. 3. A few slightly prominent mediastinal nodes. No bulky or encasing adenopathy. 4. Aortic and coronary artery atherosclerosis and upper limit of normal pulmonary trunk caliber. 5. Minimal air in the pulmonary trunk likely iatrogenic. 6. Moderate-sized hiatal hernia. 7. Paraduodenal stranding most likely due to duodenitis or inflamed ulcerative disease. Not likely related to the diffusely atrophic pancreas but clinical correlation advised. No free air or abscess. 8. Constipation with diverticulosis. 9. Minimal ascites.  Mild body wall edema. Electronically Signed   By: Telford Nab M.D.   On: 05/16/2022 01:38   DG Chest Port 1 View  Result Date: 05/15/2022 CLINICAL DATA:  Weakness, hypotension, bradycardia EXAM: PORTABLE CHEST 1 VIEW COMPARISON:  05/10/2022 FINDINGS: Lungs volumes are small, but are symmetric and are clear. No pneumothorax or pleural effusion. Cardiac size within normal limits. Pulmonary vascularity is normal. Osseous structures are age-appropriate. No acute bone abnormality. IMPRESSION: No active disease. Electronically Signed   By: Fidela Salisbury M.D.   On: 05/15/2022 21:19   DG Chest Port 1 View  Result Date: 05/10/2022 CLINICAL DATA:  Weakness and shortness of breath EXAM: PORTABLE CHEST 1 VIEW COMPARISON:  05/02/2021 FINDINGS: Shallow inspiration. Heart size and pulmonary vascularity are normal for technique. Lungs are clear. No pleural effusions. No pneumothorax. Mediastinal contours appear intact. Probable esophageal hiatal hernia behind the heart. Postoperative changes in the cervical spine. Degenerative changes in the left upper quadrant. Degenerative changes in the shoulders and spine. IMPRESSION: Shallow inspiration.  No evidence of active pulmonary disease. Electronically Signed   By: Lucienne Capers M.D.    On: 05/10/2022 23:38   CT HEAD WO CONTRAST (5MM)  Result Date: 05/10/2022 CLINICAL DATA:  Mental status change of unknown cause. Weakness over 1 year, worse in the last few weeks. Unwitnessed fall yesterday. Unsteadiness, dizziness, and confusion. EXAM: CT HEAD WITHOUT CONTRAST TECHNIQUE: Contiguous axial images were obtained from the base of the skull through the vertex without intravenous contrast. RADIATION DOSE REDUCTION: This exam was performed according to the departmental dose-optimization program which includes automated exposure control, adjustment of the mA and/or kV according to patient size and/or use of iterative reconstruction technique. COMPARISON:  09/29/2020 FINDINGS: Brain: Diffuse cerebral atrophy. Ventricular dilatation could be due to central atrophy or normal pressure hydrocephalus. No change since prior study. Low-attenuation changes in the deep white matter consistent with small vessel ischemia. No abnormal extra-axial fluid collections. No mass effect or midline shift. Gray-white matter junctions are distinct. Basal cisterns are not effaced. No acute intracranial hemorrhage. Vascular: No hyperdense  vessel or unexpected calcification. Skull: Normal. Negative for fracture or focal lesion. Sinuses/Orbits: Paranasal sinuses and mastoid air cells are clear. Other: None. IMPRESSION: No acute intracranial abnormalities. Chronic atrophy and small vessel ischemic changes. Ventricular dilatation, likely central atrophy but could indicate normal pressure hydrocephalus in the appropriate clinical setting. No change since prior study. Electronically Signed   By: Lucienne Capers M.D.   On: 05/10/2022 23:28    Orson Eva, DO  [facility]  If 7PM-7AM, please contact night-coverage [email or web address] Password [email or web address] 05/17/2022, 3:31 PM   LOS: 1 day

## 2022-05-17 NOTE — NC FL2 (Signed)
[place] MEDICAID FL2 LEVEL OF CARE SCREENING TOOL     IDENTIFICATION  Patient Name: Jeanne Kim Birthdate: 1941/05/31 Sex: female Admission Date (Current Location): 05/15/2022  [facility] and [place] Number:  [employer] and Address:  [place] [address], [place]      Provider Number: [id number]  Attending Physician Name and Address:  Orson Eva, MD  Relative Name and Phone Number:       Current Level of Care: Hospital Recommended Level of Care: [place] Prior Approval Number:    Date Approved/Denied:   PASRR Number: [id number]  Discharge Plan: SNF    Current Diagnoses: Patient Active Problem List   Diagnosis Date Noted   SIRS (systemic inflammatory response syndrome) ([place]) 76/28/3151   Acute metabolic encephalopathy 76/16/0737   Generalized weakness 05/04/2021   Hypoalbuminemia due to protein-calorie malnutrition ([place]) 05/04/2021   Hyperlipidemia 05/04/2021   Prolonged QT interval 05/04/2021   Symptomatic anemia 12/23/2020   GI bleeding 12/20/2020   Hypokalemia due to excessive gastrointestinal loss of potassium 10/31/2020   Hyperbilirubinemia 10/31/2020   Thrombocytosis 10/31/2020   Hyperglycemia due to diabetes mellitus ([place]) 10/31/2020   Abdominal pain 10/04/2020   Fibromyalgia 10/04/2020   Hyperglycemia due to type 2 diabetes mellitus ([place]) 10/04/2020   Proctocolitis 10/04/2020   Gastric ulcer 10/04/2020   History of gastrointestinal bleeding 10/04/2020   Intracranial bleed ([place]) 09/28/2020   AKI (acute kidney injury) ([place]) 09/28/2020   Upper GI bleed 08/05/2020   Hypokalemia 03/25/2020   Chronic pain 03/22/2020   Hypomagnesemia 03/22/2020   Insomnia 03/22/2020   MDD (major depressive disorder) 03/22/2020   Status post total replacement of left hip 03/21/2020   S/P musculoskeletal system surgery 03/21/2020   At risk for falls 02/24/2020   Chronic, continuous use of opioids 02/24/2020    Senile osteoporosis 02/24/2020   History of radiation therapy 02/24/2020   Leukemoid reaction 02/06/2020   Microcytic anemia 02/05/2020   Electrolyte abnormality 02/05/2020   Lower extremity edema 02/04/2020   Pathological fracture of left hip due to age-related osteoporosis ([place]) 02/04/2020   History of fracture of left hip 02/03/2020   Fecal incontinence    Swelling of limb-Bilateral leg 04/29/2014   Weakness of both legs 04/29/2014   Dizziness and giddiness 04/29/2014   Malignant neoplasm of breast ([place]) 07/02/2013   Occlusion and stenosis of carotid artery without mention of cerebral infarction 04/28/2013   Carotid stenosis 01/20/2013   Autonomic orthostatic hypotension 10/17/2012   Postural hypotension 10/17/2012   Dyspnea 08/27/2012   Syncope 02/17/2012   Head trauma 02/14/2012   Diaphoresis 02/05/2012   Unspecified constipation 12/12/2011   Personal history of colonic polyps 12/12/2011   Family history of malignant neoplasm of gastrointestinal tract 12/12/2011   Diverticulosis of colon 12/12/2011   Gastritis 12/12/2011   GERD with stricture 12/12/2011   Gastroesophageal reflux disease 11/27/2011   Constipation 11/27/2011   IBS (irritable bowel syndrome) 11/27/2011   Bacterial overgrowth syndrome 11/27/2011   S/P cholecystectomy 11/27/2011   Adult hypothyroidism 07/13/2011   ADENOCARCINOMA, LEFT BREAST 12/09/2009   CHEST PAIN-PRECORDIAL 12/09/2009   History of breast cancer 12/09/2009   Hypothyroidism 04/27/2009   DIABETES MELLITUS 04/07/2009   HYPERCHOLESTEROLEMIA  IIA 04/07/2009   OVERWEIGHT/OBESITY 04/07/2009   Essential hypertension 04/07/2009   CAD (coronary artery disease) 04/07/2009   Irritable bowel syndrome 04/07/2009   DEGENERATIVE JOINT DISEASE 04/07/2009   DEGENERATIVE DISC DISEASE 04/07/2009   FIBROMYALGIA 04/07/2009   Uncontrolled type 2 diabetes  mellitus with hyperglycemia, with long-term current use of insulin ([place]) 05/04/2008   Thyroid nodule  05/04/2008    Orientation RESPIRATION BLADDER Height & Weight     Self, Time, Situation, Place  Normal Continent Weight: 164 lb 0.4 oz (74.4 kg) Height:  '5\' 6"'$  (167.6 cm)  BEHAVIORAL SYMPTOMS/MOOD NEUROLOGICAL BOWEL NUTRITION STATUS      Continent Diet (see dc summary)  AMBULATORY STATUS COMMUNICATION OF NEEDS Skin   Extensive Assist Verbally Normal                       Personal Care Assistance Level of Assistance  Bathing, Feeding, Dressing Bathing Assistance: Limited assistance Feeding assistance: Independent Dressing Assistance: Limited assistance     Functional Limitations Info  Sight, Hearing, Speech Sight Info: Adequate Hearing Info: Adequate Speech Info: Adequate    SPECIAL CARE FACTORS FREQUENCY  PT (By licensed PT), OT (By licensed OT)     PT Frequency: 5x week OT Frequency: 3x week            Contractures Contractures Info: Not present    Additional Factors Info  Code Status, Allergies Code Status Info: Full Allergies Info: Codeine, Sulfonamide Derivatives, Canagliflozin, Penicillins, Sulfa Antibiotics           Current Medications (05/17/2022):  This is the current hospital active medication list Current Facility-Administered Medications  Medication Dose Route Frequency Provider Last Rate Last Admin   aspirin chewable tablet 81 mg  81 mg Oral Daily Jani Gravel, MD   81 mg at 05/17/22 0805   atorvastatin (LIPITOR) tablet 20 mg  20 mg Oral Daily Jani Gravel, MD   20 mg at 05/17/22 0805   Chlorhexidine Gluconate Cloth 2 % PADS 6 each  6 each Topical Q0600 Jani Gravel, MD   6 each at 05/17/22 0700   docusate sodium (COLACE) capsule 100 mg  100 mg Oral BID PRN Jani Gravel, MD   100 mg at 05/16/22 0758   DULoxetine (CYMBALTA) DR capsule 60 mg  60 mg Oral Daily Jani Gravel, MD   60 mg at 05/17/22 0805   enoxaparin (LOVENOX) injection 40 mg  40 mg Subcutaneous Q24H Madueme, Elvira C, RPH   40 mg at 05/17/22 0805   insulin aspart (novoLOG) injection 0-5  Units  0-5 Units Subcutaneous QHS Jani Gravel, MD   3 Units at 05/16/22 2122   insulin aspart (novoLOG) injection 0-9 Units  0-9 Units Subcutaneous TID WC Jani Gravel, MD   7 Units at 05/17/22 1123   insulin aspart (novoLOG) injection 3 Units  3 Units Subcutaneous TID WC Tat, Shanon Brow, MD       [START ON 05/18/2022] insulin glargine-yfgn (SEMGLEE) injection 20 Units  20 Units Subcutaneous Daily Tat, David, MD       levothyroxine (SYNTHROID) tablet 150 mcg  150 mcg Oral Q0600 Jani Gravel, MD   150 mcg at 05/17/22 8119   linaclotide (LINZESS) capsule 290 mcg  290 mcg Oral QAC breakfast Jani Gravel, MD   290 mcg at 05/17/22 0737   magnesium sulfate IVPB 2 g 50 mL  2 g Intravenous Once Orson Eva, MD 50 mL/hr at 05/17/22 1551 Infusion Verify at 05/17/22 1551   midodrine (PROAMATINE) tablet 10 mg  10 mg Oral TID WC Orson Eva, MD   10 mg at 05/17/22 1123   norepinephrine (LEVOPHED) '4mg'$  in 259m (0.016 mg/mL) premix infusion  2-10 mcg/min Intravenous Titrated KJani Gravel MD   Stopped at 05/17/22 01478  polyethylene glycol (  MIRALAX / GLYCOLAX) packet 17 g  17 g Oral Daily PRN Jani Gravel, MD         Discharge Medications: Please see discharge summary for a list of discharge medications.  Relevant Imaging Results:  Relevant Lab Results:   Additional Information SSN: [id number] LCSW

## 2022-05-17 NOTE — Inpatient Diabetes Management (Signed)
Inpatient Diabetes Program Recommendations  AACE/ADA: New Consensus Statement on Inpatient Glycemic Control (2015)  Target Ranges:  Prepandial:   less than 140 mg/dL      Peak postprandial:   less than 180 mg/dL (1-2 hours)      Critically ill patients:  140 - 180 mg/dL   Lab Results  Component Value Date   GLUCAP 279 (H) 05/17/2022   HGBA1C 10.4 (H) 05/17/2022    Review of Glycemic Control  Latest Reference Range & Units 05/17/22 07:33 05/17/22 11:19 05/17/22 15:39  Glucose-Capillary 70 - 99 mg/dL 234 (H) 301 (H) 279 (H)  (H): Data is abnormally high Diabetes history: DM2 Outpatient Diabetes medications:  Soliqua 100-33 QD Jardiance 25 mg QD Metformin 1000 mg BID Current orders for Inpatient glycemic control:  Semglee 20 QD (increased today) Novolog 0-9 units TID and 0-5 units QHS Novolog 3 units TID  Spoke with patient and family at bedside.  Reviewed patient's current A1c of 10.4% (average BG of 252 mg/dL). Explained what a A1c is and what it measures. Also reviewed goal A1c with patient, importance of good glucose control @ home, and blood sugar goals.  She confirms above home medications.  She states she is forgetful and may be forgetting to take her insulin everyday.  She admits to likely sweets.  Her CBGs at home are usually around 200 ,g/dL.  She is current with an endocrinologist in [facility].  She states she will make a follow up appointment with her.    She will likely be discharging to SNF.  She is interested in the [employer].  This would be helpful as she often forgets to check her glucose.  Alarms can be set for high and low blood sugars.    Educated on The Plate Method, CHO's, portion control, CBGs at home fasting and mid afternoon, F/U with PCP every 3 months, bring meter to PCP office, long and short term complications of uncontrolled BG, and importance of exercise.  Freestyle Libre 3 order number [id number]  Will continue to follow while  inpatient.  Thank you, Reche Dixon, MSN, [place] Diabetes Coordinator Inpatient Diabetes Program [phone] (team pager from 8a-5p)

## 2022-05-17 NOTE — Assessment & Plan Note (Signed)
-  replete °

## 2022-05-17 NOTE — TOC Progression Note (Signed)
Transition of Care [facility]) - Progression Note    Patient Details  Name: KORIN SETZLER MRN: [id number] Date of Birth: 1940/11/28  [facility] CM/SW Contact  Shade Flood, LCSW Phone Number: 05/17/2022, 3:59 PM  Clinical Narrative:     TOC following. PT recommending SNF rehab at dc. Spoke with pt at bedside to review dc planning and PT recommendation. Pt agreeable to SNF referrals. CMS provider options reviewed and pt referred as requested. Will start insurance authorization. MD anticipating dc in 1-2 days.   TOC will follow.  Expected Discharge Plan: Skilled Nursing Facility Barriers to Discharge: Continued Medical Work up  Expected Discharge Plan and Services Expected Discharge Plan: [place] In-house Referral: Clinical Social Work Discharge Planning Services: CM Consult Post Acute Care Choice: [address] arrangements for the past 2 months: Single Family Home                                       Social Determinants of Health (SDOH) Interventions    Readmission Risk Interventions    05/16/2022   11:25 AM  Readmission Risk Prevention Plan  Transportation Screening Complete  Medication Review Press photographer) Complete  HRI or [place] Complete  SW Recovery Care/Counseling Consult Complete  Palliative Care Screening Not [place] Not Applicable

## 2022-05-17 NOTE — Evaluation (Signed)
Physical Therapy Evaluation Patient Details Name: Jeanne Kim MRN: [id number] DOB: 06/02/1941 Today's Date: 05/17/2022  History of Present Illness  Jeanne Kim is a 81 y.o. female with medical history significant of Dm2, hypertension, hyperlipidemia, CAD,  L carotid stenosis , hypothyroidism, presents with c/o not feeling well yesterday. "Felt like she was going to die",  Pt having slight difficulty with urination. Pt denies fever, chills, cough, cp, palp, sob, n/v, diarrhea, brbpr, black stool.  EMS brought patient to hypotensive.  Pt notes poor appetite. Pt states she has not been taking her lasix.    Clinical Impression  Patient limited for functional mobility as stated below secondary to BLE weakness, fatigue and poor standing balance. Patient requires assist and cueing to transition to seated EoB. She demonstrates good sitting balance and requires assist to transfer to standing with RW with unsteadiness upon standing. Patient able to ambulate with RW to commode and in hall with assist. She ambulates with slow, slightly unsteady cadence and intermittent cueing for sequencing, safety, and navigation. Patient will benefit from continued physical therapy in hospital and recommended venue below to increase strength, balance, endurance for safe ADLs and gait.        Recommendations for follow up therapy are one component of a multi-disciplinary discharge planning process, led by the attending physician.  Recommendations may be updated based on patient status, additional functional criteria and insurance authorization.  Follow Up Recommendations Skilled nursing-short term rehab (<3 hours/day) Can patient physically be transported by private vehicle: Yes    Assistance Recommended at Discharge Intermittent Supervision/Assistance  Patient can return home with the following  A little help with walking and/or transfers;A little help with bathing/dressing/bathroom;Assistance with  cooking/housework;Help with stairs or ramp for entrance;Assist for transportation;Direct supervision/assist for medications management    Equipment Recommendations None recommended by PT  Recommendations for Other Services       Functional Status Assessment Patient has had a recent decline in their functional status and demonstrates the ability to make significant improvements in function in a reasonable and predictable amount of time.     Precautions / Restrictions Precautions Precautions: None Restrictions Weight Bearing Restrictions: No      Mobility  Bed Mobility Overal bed mobility: Needs Assistance Bed Mobility: Supine to Sit     Supine to sit: Min assist, Min guard, HOB elevated     General bed mobility comments: slow, labored    Transfers Overall transfer level: Needs assistance Equipment used: Rolling walker (2 wheels) Transfers: Sit to/from Stand, Bed to chair/wheelchair/BSC Sit to Stand: Min guard, Min assist   Step pivot transfers: Min guard            Ambulation/Gait Ambulation/Gait assistance: Min guard, Min assist Gait Distance (Feet): 60 Feet Assistive device: Rolling walker (2 wheels) Gait Pattern/deviations: Step-through pattern, Trunk flexed       General Gait Details: slow, labored, slightly unsteady cadence with RW  Stairs            Wheelchair Mobility    Modified Rankin (Stroke Patients Only)       Balance Overall balance assessment: Needs assistance Sitting-balance support: No upper extremity supported, Feet supported Sitting balance-Leahy Scale: Good Sitting balance - Comments: seated EOB   Standing balance support: Bilateral upper extremity supported Standing balance-Leahy Scale: Fair Standing balance comment: with RW                             Pertinent  Vitals/Pain Pain Assessment Pain Assessment: No/denies pain    Home Living Family/patient expects to be discharged to:: Private residence Living  Arrangements: Spouse/significant other Available Help at Discharge: Family Type of Home: House Home Access: Stairs to enter Entrance Stairs-Rails: Right Entrance Stairs-Number of Steps: 2   Home Layout: Able to live on main level with bedroom/bathroom Home Equipment: Conservation officer, nature (2 wheels);Rollator (4 wheels);Hand held shower head;Shower seat;Cane - single point      Prior Function Prior Level of Function : Independent/Modified Independent;History of Falls (last six months)             Mobility Comments: patient states community ambulation with RW PRN ADLs Comments: states independent     Hand Dominance        Extremity/Trunk Assessment   Upper Extremity Assessment Upper Extremity Assessment: Generalized weakness    Lower Extremity Assessment Lower Extremity Assessment: Generalized weakness    Cervical / Trunk Assessment Cervical / Trunk Assessment: Normal  Communication   Communication: No difficulties  Cognition Arousal/Alertness: Awake/alert Behavior During Therapy: WFL for tasks assessed/performed Overall Cognitive Status: Within Functional Limits for tasks assessed                                          General Comments      Exercises     Assessment/Plan    PT Assessment Patient needs continued PT services  PT Problem List Decreased strength;Decreased activity tolerance;Decreased balance;Decreased mobility       PT Treatment Interventions DME instruction;Therapeutic exercise;Gait training;Balance training;Manual techniques;Stair training;Neuromuscular re-education;Functional mobility training;Therapeutic activities;Patient/family education    PT Goals (Current goals can be found in the Care Plan section)  Acute Rehab PT Goals Patient Stated Goal: rehab then home PT Goal Formulation: With patient Time For Goal Achievement: 05/31/22 Potential to Achieve Goals: Good    Frequency Min 3X/week     Co-evaluation                AM-PAC PT "6 Clicks" Mobility  Outcome Measure Help needed turning from your back to your side while in a flat bed without using bedrails?: A Little Help needed moving from lying on your back to sitting on the side of a flat bed without using bedrails?: A Little Help needed moving to and from a bed to a chair (including a wheelchair)?: A Little Help needed standing up from a chair using your arms (e.g., wheelchair or bedside chair)?: A Little Help needed to walk in hospital room?: A Lot Help needed climbing 3-5 steps with a railing? : A Lot 6 Click Score: 16    End of Session Equipment Utilized During Treatment: Gait belt Activity Tolerance: Patient tolerated treatment well Patient left: in chair;with family/visitor present;with chair alarm set Nurse Communication: Mobility status      Time: 9470-9628 PT Time Calculation (min) (ACUTE ONLY): 30 min   Charges:   PT Evaluation $PT Eval Low Complexity: 1 Low PT Treatments $Therapeutic Activity: 23-37 mins        2:44 PM, 05/17/22 Mearl Latin PT, DPT Physical Therapist at [facility]

## 2022-05-17 NOTE — Assessment & Plan Note (Addendum)
Add diff to CBC>>no concerning WBC precursors Pt received dexamathasone 05/15/22 at 2353 --WBC trending down now

## 2022-05-17 NOTE — Plan of Care (Signed)

## 2022-05-17 NOTE — Plan of Care (Signed)
  Problem: Acute Rehab PT Goals(only PT should resolve) Goal: Pt Will Transfer Bed To Chair/Chair To Bed Outcome: Progressing Flowsheets (Taken 05/17/2022 1445) Pt will Transfer Bed to Chair/Chair to Bed:  with supervision  min guard assist Goal: Pt Will Ambulate Outcome: Progressing Flowsheets (Taken 05/17/2022 1445) Pt will Ambulate:  > 125 feet  with rolling walker  with min guard assist  with supervision Goal: Pt/caregiver will Perform Home Exercise Program Outcome: Progressing Flowsheets (Taken 05/17/2022 1445) Pt/caregiver will Perform Home Exercise Program:  For increased strengthening  For improved balance  With Supervision, verbal cues required/provided  2:45 PM, 05/17/22 Mearl Latin PT, DPT Physical Therapist at [facility]

## 2022-05-17 NOTE — Assessment & Plan Note (Addendum)
Presented with hypotension, tachycardia, and leukocytosis Sepsis ruled out Personally reviewed chest x-ray--no consolidation UA without pyuria Follow blood cultures--neg to date Discontinue empiric vancomycin and cefepime>>remains stable Initially on IV fluids>>saline locked Lactic acid peaked 2.2 Check PCT <0.10 x 2 BP remains soft>>continue midodrine>>decrease/wean dose

## 2022-05-17 NOTE — Progress Notes (Addendum)
Pharmacy Antibiotic Note  Jeanne Kim is a 81 y.o. female admitted on 05/15/2022 with sepsis.  Pharmacy has been consulted for vancomycin and cefepime dosing. Initially admitted with AKI, now resolving. Last dose of Vancomycin was 8/9 at 1am. WBC continues to trend upwards  Microbiology: 8/8 Ucx > 8/8 BCx >  Plan: Continue vancomycin '1250mg'$  IV q 24h Change cefepime 2gm IV q 12  Height: '5\' 6"'$  (167.6 cm) Weight: 74.4 kg (164 lb 0.4 oz) IBW/kg (Calculated) : 59.3 Vd 54L, Ke 0.042, half-life 16 hr, AUC 559  Temp (24hrs), Avg:97.9 F (36.6 C), Min:97.4 F (36.3 C), Max:98.7 F (37.1 C)  Recent Labs  Lab 05/11/22 0002 05/15/22 2047 05/16/22 0146 05/16/22 0730 05/17/22 0343  WBC 11.2* 21.7*  --  21.4* 29.6*  CREATININE 0.76 1.68*  --  1.30* 0.95  LATICACIDVEN  --  2.2* 2.0*  --   --      Estimated Creatinine Clearance: 49.5 mL/min (by C-G formula based on SCr of 0.95 mg/dL).    Allergies  Allergen Reactions   Codeine Hives   Sulfonamide Derivatives Hives   Canagliflozin    Penicillins Hives    Pt states she does NOT have allergy to penicillin   Sulfa Antibiotics Other (See Comments)    unknown   Thank you for allowing pharmacy to be a part of this patient's care.  Lorenso Courier, PharmD Clinical Pharmacist 05/17/2022 8:49 AM

## 2022-05-18 ENCOUNTER — Inpatient Hospital Stay (HOSPITAL_COMMUNITY): Payer: Medicare HMO

## 2022-05-18 DIAGNOSIS — I10 Essential (primary) hypertension: Secondary | ICD-10-CM

## 2022-05-18 DIAGNOSIS — G9341 Metabolic encephalopathy: Secondary | ICD-10-CM | POA: Diagnosis not present

## 2022-05-18 DIAGNOSIS — N179 Acute kidney failure, unspecified: Secondary | ICD-10-CM | POA: Diagnosis not present

## 2022-05-18 DIAGNOSIS — R0609 Other forms of dyspnea: Secondary | ICD-10-CM | POA: Diagnosis not present

## 2022-05-18 DIAGNOSIS — R6510 Systemic inflammatory response syndrome (SIRS) of non-infectious origin without acute organ dysfunction: Secondary | ICD-10-CM | POA: Diagnosis not present

## 2022-05-18 LAB — ECHOCARDIOGRAM COMPLETE
AR max vel: 2.18 cm2
AV Area VTI: 2.16 cm2
AV Area mean vel: 2.04 cm2
AV Mean grad: 4 mmHg
AV Peak grad: 8.1 mmHg
Ao pk vel: 1.42 m/s
Area-P 1/2: 3.34 cm2
Calc EF: 55.7 %
Height: 66 in
MV VTI: 1.9 cm2
S' Lateral: 2.9 cm
Single Plane A2C EF: 59.3 %
Single Plane A4C EF: 56.3 %
Weight: 2624.36 oz

## 2022-05-18 LAB — CBC WITH DIFFERENTIAL/PLATELET
Abs Immature Granulocytes: 0.29 10*3/uL — ABNORMAL HIGH (ref 0.00–0.07)
Basophils Absolute: 0.1 10*3/uL (ref 0.0–0.1)
Basophils Relative: 1 %
Eosinophils Absolute: 0.3 10*3/uL (ref 0.0–0.5)
Eosinophils Relative: 2 %
HCT: 33.9 % — ABNORMAL LOW (ref 36.0–46.0)
Hemoglobin: 10.3 g/dL — ABNORMAL LOW (ref 12.0–15.0)
Immature Granulocytes: 2 %
Lymphocytes Relative: 19 %
Lymphs Abs: 3.4 10*3/uL (ref 0.7–4.0)
MCH: 24.5 pg — ABNORMAL LOW (ref 26.0–34.0)
MCHC: 30.4 g/dL (ref 30.0–36.0)
MCV: 80.5 fL (ref 80.0–100.0)
Monocytes Absolute: 2.1 10*3/uL — ABNORMAL HIGH (ref 0.1–1.0)
Monocytes Relative: 12 %
Neutro Abs: 12 10*3/uL — ABNORMAL HIGH (ref 1.7–7.7)
Neutrophils Relative %: 64 %
Platelets: 439 10*3/uL — ABNORMAL HIGH (ref 150–400)
RBC: 4.21 MIL/uL (ref 3.87–5.11)
RDW: 16.1 % — ABNORMAL HIGH (ref 11.5–15.5)
WBC: 18.3 10*3/uL — ABNORMAL HIGH (ref 4.0–10.5)
nRBC: 0 % (ref 0.0–0.2)

## 2022-05-18 LAB — BASIC METABOLIC PANEL
Anion gap: 8 (ref 5–15)
BUN: 17 mg/dL (ref 8–23)
CO2: 28 mmol/L (ref 22–32)
Calcium: 8.2 mg/dL — ABNORMAL LOW (ref 8.9–10.3)
Chloride: 101 mmol/L (ref 98–111)
Creatinine, Ser: 0.64 mg/dL (ref 0.44–1.00)
GFR, Estimated: >60 mL/min (ref >60)
Glucose, Bld: 115 mg/dL — ABNORMAL HIGH (ref 70–99)
Potassium: 3 mmol/L — ABNORMAL LOW (ref 3.5–5.1)
Sodium: 137 mmol/L (ref 135–145)

## 2022-05-18 LAB — GLUCOSE, CAPILLARY
Glucose-Capillary: 114 mg/dL — ABNORMAL HIGH (ref 70–99)
Glucose-Capillary: 213 mg/dL — ABNORMAL HIGH (ref 70–99)
Glucose-Capillary: 207 mg/dL — ABNORMAL HIGH (ref 70–99)
Glucose-Capillary: 229 mg/dL — ABNORMAL HIGH (ref 70–99)

## 2022-05-18 LAB — MAGNESIUM: Magnesium: 1.7 mg/dL (ref 1.7–2.4)

## 2022-05-18 MED ORDER — MAGNESIUM SULFATE 2 GM/50ML IV SOLN
2.0000 g | Freq: Once | INTRAVENOUS | Status: AC
  Administered 2022-05-18: 2 g via INTRAVENOUS
  Filled 2022-05-18: qty 50

## 2022-05-18 MED ORDER — POTASSIUM CHLORIDE CRYS ER 20 MEQ PO TBCR
40.0000 meq | EXTENDED_RELEASE_TABLET | Freq: Once | ORAL | Status: AC
  Administered 2022-05-18: 40 meq via ORAL
  Filled 2022-05-18: qty 2

## 2022-05-18 MED ORDER — MIDODRINE HCL 5 MG PO TABS
5.0000 mg | ORAL_TABLET | Freq: Three times a day (TID) | ORAL | Status: DC
  Administered 2022-05-19 – 2022-05-20 (×6): 5 mg via ORAL
  Filled 2022-05-18 (×5): qty 1

## 2022-05-18 NOTE — Care Management Important Message (Signed)
Important Message  Patient Details  Name: Jeanne Kim MRN: [id number] Date of Birth: 1940-12-16   Medicare Important Message Given:  Yes     Tommy Medal 05/18/2022, 1:07 PM

## 2022-05-18 NOTE — Progress Notes (Signed)
*  PRELIMINARY RESULTS* Echocardiogram 2D Echocardiogram has been performed.  Elpidio Anis 05/18/2022, 2:27 PM

## 2022-05-18 NOTE — Plan of Care (Signed)

## 2022-05-18 NOTE — TOC Progression Note (Signed)
Transition of Care [facility]) - Progression Note    Patient Details  Name: Jeanne Kim MRN: [id number] Date of Birth: July 11, 1941  [facility]) CM/SW Contact  Shade Flood, LCSW Phone Number: 05/18/2022, 11:55 AM  Clinical Narrative:     Pt received bed offer from [facility]. Discussed with pt and her husband and they accept. Insurance authorization being started today. Anticipating dc Monday.  TOC will follow.  Expected Discharge Plan: Skilled Nursing Facility Barriers to Discharge: Continued Medical Work up  Expected Discharge Plan and Services Expected Discharge Plan: [place] In-house Referral: Clinical Social Work Discharge Planning Services: CM Consult Post Acute Care Choice: [place] arrangements for the past 2 months: Single Family Home                                       Social Determinants of Health (SDOH) Interventions    Readmission Risk Interventions    05/16/2022   11:25 AM  Readmission Risk Prevention Plan  Transportation Screening Complete  Medication Review Press photographer) Complete  HRI or [place] Complete  SW Recovery Care/Counseling Consult Complete  Palliative Care Screening Not [place] Not Applicable

## 2022-05-18 NOTE — Progress Notes (Addendum)
PROGRESS NOTE  Jeanne Kim NUU:[id number] DOB: 02/12/1941 DOA: 05/15/2022 PCP: Neale Burly, MD  Brief History:  81 year old female with a history of diabetes mellitus, hypertension, hyperlipidemia, hypothyroidism, coronary artery disease presenting with approximately 1 week history of generalized weakness and decreased oral intake and decreased urine output.  Unfortunately, the patient is a poor historian secondary to some cognitive impairment and acute encephalopathy.  Nevertheless, the patient denies any chest pain, shortness breath, abdominal pain, nausea, vomiting, diarrhea, abdominal pain. In the ED, the patient was noted to be hypothermic with a temperature of 96.6 F.  She was hypotensive with blood pressure 83/36.  She was started on Levophed.  WBC 21.7, hemoglobin 10.0, platelets 429,000.  Sodium 128, potassium 4.3, bicarbonate 24, serum creatinine 1.68.  TSH 9.865.  CT of the chest abdomen pelvis showed some interlobular septal thickening.  There is no cardiomegaly and prominent central pulmonary veins.  There is diffuse bronchial wall thickening.  There was no consolidation.  There is periduodenal stranding without any free air or ascites.  UA was negative for pyuria.  The patient was started vancomycin and cefepime and IV fluids.  She was admitted for further evaluation and treatment.  She was initially started on Levophed. She was ultimately weaned off levophed with IVF resuscitation.  After all cultures remained neg and pt remained stable, vanc and cefepime were discontinued and patient remains clinically stable.  Current barrier for d/c is waiting for insurance authorization.    Assessment and Plan: * SIRS (systemic inflammatory response syndrome) (HCC) Presented with hypotension, tachycardia, and leukocytosis Sepsis ruled out Personally reviewed chest x-ray--no consolidation UA without pyuria Follow blood cultures--neg to date Discontinue empiric vancomycin and  cefepime>>remains stable Initially on IV fluids>>saline locked Lactic acid peaked 2.2 Check PCT <0.10 x 2 BP remains soft>>continue midodrine>>decrease/wean dose  Acute metabolic encephalopathy Initially confused and agitated Secondary to sepsis and acute kidney injury Has underlying cognitive impairment --overall improved, now at baseline  AKI (acute kidney injury) ([place]) Baseline creatinine 0.7-1.0 Presented with serum creatinine 1.68>>0.64 Secondary to volume depletion and hypotension Holding ramipril and metolazone>>BP remains soft/controlled Holding furosemide Improved with IVF>>now saline locked  Hypomagnesemia repleted  Hypokalemia replete  Leukemoid reaction Add diff to CBC>>no concerning WBC precursors Pt received dexamathasone 05/15/22 at 2353 --WBC trending down now  Uncontrolled type 2 diabetes mellitus with hyperglycemia, with long-term current use of insulin (HCC) NovoLog sliding scale 05/17/22  hemoglobin A1c--10.4 Increase semglee to 20 units Holding metformin Add novolog 3 units with meals  CAD (coronary artery disease) No chest pain presently Personally reviewed EKG--junctional rhythm, no ST-T wave changes  Essential hypertension Holding ramipril amlodipine and metoprolol succinate secondary to hypotension Family states that the patient has not been taking her medications properly anyway BP remains soft but stable with midodrine Echo EF 60-65%, no WMA, G1DD, mild MR  Hypothyroidism There has been concern that the patient has not been taking her medications at home TSH 9.865 Restarted levothyroxine   Family Communication:   daughter updated 33/10   Consultants:  none   Code Status:  FULL   DVT Prophylaxis:  [place] Lovenox     Procedures: As Listed in Progress Note Above   Antibiotics: Vanc 8/9>.8/10 Cefepime 8/9>8/10          Subjective: Patient denies fevers, chills, headache, chest pain, dyspnea, nausea, vomiting, diarrhea,  abdominal pain, dysuria, hematuria, hematochezia, and melena.   Objective: Vitals:   05/18/22 0045 05/18/22  7169 05/18/22 0557 05/18/22 1603  BP:  113/62 112/64 127/76  Pulse:  81 68 71  Resp:  '18 20 17  '$ Temp: 97.6 F (36.4 C) 98.6 F (37 C) 97.6 F (36.4 C) 98 F (36.7 C)  TempSrc: Oral  Oral Oral  SpO2:  97% 98% 99%  Weight:      Height:        Intake/Output Summary (Last 24 hours) at 05/18/2022 1655 Last data filed at 05/18/2022 1230 Gross per 24 hour  Intake 720 ml  Output 900 ml  Net -180 ml   Weight change:  Exam:  General:  Pt is alert, follows commands appropriately, not in acute distress HEENT: No icterus, No thrush, No neck mass, [place]/AT Cardiovascular: RRR, S1/S2, no rubs, no gallops Respiratory: CTA bilaterally, no wheezing, no crackles, no rhonchi Abdomen: Soft/+BS, non tender, non distended, no guarding Extremities: No edema, No lymphangitis, No petechiae, No rashes, no synovitis   Data Reviewed: I have personally reviewed following labs and imaging studies Basic Metabolic Panel: Recent Labs  Lab 05/15/22 2047 05/15/22 2130 05/16/22 0730 05/17/22 0343 05/18/22 0603  NA 128*  --  131* 131* 137  K 4.3  --  4.1 3.1* 3.0*  CL 92*  --  94* 97* 101  CO2 24  --  '24 24 28  '$ GLUCOSE 240*  --  234* 273* 115*  BUN 34*  --  30* 24* 17  CREATININE 1.68*  --  1.30* 0.95 0.64  CALCIUM 8.3*  --  8.5* 8.1* 8.2*  MG  --  1.4* 1.6* 1.7 1.7   Liver Function Tests: Recent Labs  Lab 05/15/22 2047 05/16/22 0730 05/17/22 0343  AST 81* 83* 33  ALT 40 57* 36  ALKPHOS 86 100 87  BILITOT 1.7* 1.8* 0.9  PROT 6.6 7.0 6.5  ALBUMIN 3.4* 3.4* 3.2*   No results for input(s): "LIPASE", "AMYLASE" in the last 168 hours. No results for input(s): "AMMONIA" in the last 168 hours. Coagulation Profile: No results for input(s): "INR", "PROTIME" in the last 168 hours. CBC: Recent Labs  Lab 05/15/22 2047 05/16/22 0730 05/17/22 0343 05/18/22 0603  WBC 21.7* 21.4* 29.6*  18.3*  NEUTROABS 15.9*  --   --  12.0*  HGB 10.0* 11.2* 10.0* 10.3*  HCT 32.7* 35.8* 31.3* 33.9*  MCV 80.7 79.4* 78.4* 80.5  PLT 429* 515* 458* 439*   Cardiac Enzymes: No results for input(s): "CKTOTAL", "CKMB", "CKMBINDEX", "TROPONINI" in the last 168 hours. BNP: Invalid input(s): "POCBNP" CBG: Recent Labs  Lab 05/17/22 1539 05/17/22 2028 05/18/22 0731 05/18/22 1136 05/18/22 1604  GLUCAP 279* 203* 114* 213* 207*   HbA1C: Recent Labs    05/15/22 2104 05/17/22 0343  HGBA1C 10.8* 10.4*   Urine analysis:    Component Value Date/Time   COLORURINE YELLOW 05/15/2022 2320   APPEARANCEUR HAZY (A) 05/15/2022 2320   LABSPEC 1.017 05/15/2022 2320   PHURINE 7.0 05/15/2022 2320   GLUCOSEU >=500 (A) 05/15/2022 2320   HGBUR NEGATIVE 05/15/2022 2320   BILIRUBINUR NEGATIVE 05/15/2022 2320   KETONESUR NEGATIVE 05/15/2022 2320   PROTEINUR NEGATIVE 05/15/2022 2320   NITRITE NEGATIVE 05/15/2022 2320   LEUKOCYTESUR NEGATIVE 05/15/2022 2320   Sepsis Labs: '@LABRCNTIP'$ (procalcitonin:4,lacticidven:4) ) Recent Results (from the past 240 hour(s))  Culture, blood (routine x 2)     Status: None (Preliminary result)   Collection Time: 05/15/22  8:52 PM   Specimen: BLOOD RIGHT FOREARM  Result Value Ref Range Status   Specimen Description BLOOD RIGHT FOREARM  Final  Special Requests   Final    BOTTLES DRAWN AEROBIC AND ANAEROBIC Blood Culture adequate volume   Culture   Final    NO GROWTH 3 DAYS Performed at [facility] [address], [place], [place] [address]    Report Status PENDING  Incomplete  Urine Culture     Status: None   Collection Time: 05/15/22 11:20 PM   Specimen: In/Out Cath Urine  Result Value Ref Range Status   Specimen Description   Final    IN/OUT CATH URINE Performed at [facility], [address], [place], [place] [address]    Special Requests   Final    NONE Performed at [facility], [address], [place], [place] [address]    Culture   Final    NO  GROWTH Performed at [facility], [place] [address] [place], [place] [address]    Report Status 05/17/2022 FINAL  Final  Resp Panel by RT-PCR (Flu A&B, Covid) Anterior Nasal Swab     Status: None   Collection Time: 05/16/22  3:17 AM   Specimen: Anterior Nasal Swab  Result Value Ref Range Status   SARS Coronavirus 2 by RT PCR NEGATIVE NEGATIVE Final    Comment: (NOTE) SARS-CoV-2 target nucleic acids are NOT DETECTED.  The SARS-CoV-2 RNA is generally detectable in upper respiratory specimens during the acute phase of infection. The lowest concentration of SARS-CoV-2 viral copies this assay can detect is 138 copies/mL. A negative result does not preclude SARS-Cov-2 infection and should not be used as the sole basis for treatment or other patient management decisions. A negative result may occur with  improper specimen collection/handling, submission of specimen other than nasopharyngeal swab, presence of viral mutation(s) within the areas targeted by this assay, and inadequate number of viral copies(<138 copies/mL). A negative result must be combined with clinical observations, patient history, and epidemiological information. The expected result is Negative.  Fact Sheet for Patients:  EntrepreneurPulse.com.au  Fact Sheet for Healthcare Providers:  IncredibleEmployment.be  This test is no t yet approved or cleared by the [place] FDA and  has been authorized for detection and/or diagnosis of SARS-CoV-2 by FDA under an Emergency Use Authorization (EUA). This EUA will remain  in effect (meaning this test can be used) for the duration of the COVID-19 declaration under Section 564(b)(1) of the Act, 21 U.S.C.section 360bbb-3(b)(1), unless the authorization is terminated  or revoked sooner.       Influenza A by PCR NEGATIVE NEGATIVE Final   Influenza B by PCR NEGATIVE NEGATIVE Final    Comment: (NOTE) The Xpert Xpress SARS-CoV-2/FLU/RSV  plus assay is intended as an aid in the diagnosis of influenza from Nasopharyngeal swab specimens and should not be used as a sole basis for treatment. Nasal washings and aspirates are unacceptable for Xpert Xpress SARS-CoV-2/FLU/RSV testing.  Fact Sheet for Patients: EntrepreneurPulse.com.au  Fact Sheet for Healthcare Providers: IncredibleEmployment.be  This test is not yet approved or cleared by the [place] FDA and has been authorized for detection and/or diagnosis of SARS-CoV-2 by FDA under an Emergency Use Authorization (EUA). This EUA will remain in effect (meaning this test can be used) for the duration of the COVID-19 declaration under Section 564(b)(1) of the Act, 21 U.S.C. section 360bbb-3(b)(1), unless the authorization is terminated or revoked.  Performed at [facility], [address], [place], [place] [address]   MRSA Next Gen by PCR, Nasal     Status: None   Collection Time: 05/16/22 11:02 AM  Specimen: Nasal Mucosa; Nasal Swab  Result Value Ref Range Status   MRSA by PCR Next Gen NOT DETECTED NOT DETECTED Final    Comment: (NOTE) The GeneXpert MRSA Assay (FDA approved for NASAL specimens only), is one component of a comprehensive MRSA colonization surveillance program. It is not intended to diagnose MRSA infection nor to guide or monitor treatment for MRSA infections. Test performance is not FDA approved in patients less than 94 years old. Performed at [facility], [address] [place], [place] [address]      Scheduled Meds:  aspirin  81 mg Oral Daily   atorvastatin  20 mg Oral Daily   DULoxetine  60 mg Oral Daily   enoxaparin (LOVENOX) injection  40 mg Subcutaneous Q24H   insulin aspart  0-5 Units Subcutaneous QHS   insulin aspart  0-9 Units Subcutaneous TID WC   insulin aspart  3 Units Subcutaneous TID WC   insulin glargine-yfgn  20 Units Subcutaneous Daily   levothyroxine  150 mcg Oral Q0600   linaclotide   290 mcg Oral QAC breakfast   midodrine  10 mg Oral TID WC   Continuous Infusions:  Procedures/Studies: ECHOCARDIOGRAM COMPLETE  Result Date: 05/18/2022    ECHOCARDIOGRAM REPORT   Patient Name:   Jeanne Kim Date of Exam: 05/18/2022 Medical Rec #:  [id number]      Height:       66.0 in Accession #:    [id number]     Weight:       164.0 lb Date of Birth:  November 18, 1940      BSA:          1.838 m Patient Age:    46 years       BP:           112/64 mmHg Patient Gender: F              HR:           83 bpm. Exam Location:  [facility] Procedure: 2D Echo, Cardiac Doppler and Color Doppler Indications:    Dyspnea  History:        Patient has prior history of Echocardiogram examinations, most                 recent 09/28/2020. CAD, Signs/Symptoms:Chest Pain, Syncope,                 Dyspnea and Dizziness/Lightheadedness; Risk                 Factors:Hypertension, Diabetes and Dyslipidemia. Breast CA.  Sonographer:    Wenda Low Referring Phys: [phone] Rosaleigh [name] IMPRESSIONS  1. Left ventricular ejection fraction, by estimation, is 60 to 65%. The left ventricle has normal function. The left ventricle has no regional wall motion abnormalities. There is mild left ventricular hypertrophy. Left ventricular diastolic parameters are consistent with Grade I diastolic dysfunction (impaired relaxation).  2. Right ventricular systolic function is normal. The right ventricular size is normal. Tricuspid regurgitation signal is inadequate for assessing PA pressure.  3. Left atrial size was moderately dilated.  4. The mitral valve is abnormal. Mild mitral valve regurgitation. No evidence of mitral stenosis.  5. The tricuspid valve is abnormal.  6. The aortic valve is tricuspid. There is mild calcification of the aortic valve. There is mild thickening of the aortic valve. Aortic valve regurgitation is mild.  7. Aortic dilatation noted. There is mild dilatation of the ascending aorta, measuring 37 mm.  8. The inferior vena  cava is  normal in size with greater than 50% respiratory variability, suggesting right atrial pressure of 3 mmHg. FINDINGS  Left Ventricle: Left ventricular ejection fraction, by estimation, is 60 to 65%. The left ventricle has normal function. The left ventricle has no regional wall motion abnormalities. The left ventricular internal cavity size was normal in size. There is  mild left ventricular hypertrophy. Left ventricular diastolic parameters are consistent with Grade I diastolic dysfunction (impaired relaxation). Normal left ventricular filling pressure. Right Ventricle: The right ventricular size is normal. Right vetricular wall thickness was not well visualized. Right ventricular systolic function is normal. Tricuspid regurgitation signal is inadequate for assessing PA pressure. Left Atrium: Left atrial size was moderately dilated. Right Atrium: Right atrial size was normal in size. Pericardium: There is no evidence of pericardial effusion. Mitral Valve: The mitral valve is abnormal. There is mild thickening of the mitral valve leaflet(s). There is mild calcification of the mitral valve leaflet(s). Mild mitral annular calcification. Mild mitral valve regurgitation. No evidence of mitral valve stenosis. MV peak gradient, 5.1 mmHg. The mean mitral valve gradient is 2.0 mmHg. Tricuspid Valve: The tricuspid valve is abnormal. Tricuspid valve regurgitation is mild . No evidence of tricuspid stenosis. Aortic Valve: The aortic valve is tricuspid. There is mild calcification of the aortic valve. There is mild thickening of the aortic valve. There is mild aortic valve annular calcification. Aortic valve regurgitation is mild. Aortic valve mean gradient measures 4.0 mmHg. Aortic valve peak gradient measures 8.1 mmHg. Aortic valve area, by VTI measures 2.16 cm. Pulmonic Valve: The pulmonic valve was not well visualized. Pulmonic valve regurgitation is not visualized. No evidence of pulmonic stenosis. Aorta: The aortic root is  normal in size and structure and aortic dilatation noted. There is mild dilatation of the ascending aorta, measuring 37 mm. Venous: The inferior vena cava is normal in size with greater than 50% respiratory variability, suggesting right atrial pressure of 3 mmHg. IAS/Shunts: No atrial level shunt detected by color flow Doppler.  LEFT VENTRICLE PLAX 2D LVIDd:         4.80 cm     Diastology LVIDs:         2.90 cm     LV e' medial:    5.77 cm/s LV PW:         1.10 cm     LV E/e' medial:  16.3 LV IVS:        1.20 cm     LV e' lateral:   9.57 cm/s LVOT diam:     1.90 cm     LV E/e' lateral: 9.8 LV SV:         68 LV SV Index:   37 LVOT Area:     2.84 cm  LV Volumes (MOD) LV vol d, MOD A2C: 39.6 ml LV vol d, MOD A4C: 33.2 ml LV vol s, MOD A2C: 16.1 ml LV vol s, MOD A4C: 14.5 ml LV SV MOD A2C:     23.5 ml LV SV MOD A4C:     33.2 ml LV SV MOD BP:      20.6 ml RIGHT VENTRICLE RV Basal diam:  3.30 cm RV Mid diam:    3.10 cm RV S prime:     17.60 cm/s TAPSE (M-mode): 2.2 cm LEFT ATRIUM             Index        RIGHT ATRIUM           Index LA  diam:        3.60 cm 1.96 cm/m   RA Area:     13.70 cm LA Vol (A2C):   91.3 ml 49.67 ml/m  RA Volume:   33.70 ml  18.33 ml/m LA Vol (A4C):   70.5 ml 38.35 ml/m LA Biplane Vol: 82.0 ml 44.61 ml/m  AORTIC VALVE                    PULMONIC VALVE AV Area (Vmax):    2.18 cm     PV Vmax:       0.78 m/s AV Area (Vmean):   2.04 cm     PV Peak grad:  2.4 mmHg AV Area (VTI):     2.16 cm AV Vmax:           142.00 cm/s AV Vmean:          91.050 cm/s AV VTI:            0.315 m AV Peak Grad:      8.1 mmHg AV Mean Grad:      4.0 mmHg LVOT Vmax:         109.00 cm/s LVOT Vmean:        65.400 cm/s LVOT VTI:          0.240 m LVOT/AV VTI ratio: 0.76  AORTA Ao Root diam: 3.20 cm Ao Asc diam:  3.70 cm MITRAL VALVE MV Area (PHT): 3.34 cm     SHUNTS MV Area VTI:   1.90 cm     Systemic VTI:  0.24 m MV Peak grad:  5.1 mmHg     Systemic Diam: 1.90 cm MV Mean grad:  2.0 mmHg MV Vmax:       1.13 m/s MV  Vmean:      58.3 cm/s MV Decel Time: 227 msec MV E velocity: 93.80 cm/s MV A velocity: 104.00 cm/s MV E/A ratio:  0.90 Carlyle Dolly MD Electronically signed by Carlyle Dolly MD Signature Date/Time: 05/18/2022/2:39:05 PM    Final    CT CHEST ABDOMEN PELVIS WO CONTRAST  Result Date: 05/16/2022 CLINICAL DATA:  Sepsis, weakness, hypotensive and bradycardic. Limited p.o. intake past few days. EXAM: CT CHEST, ABDOMEN AND PELVIS WITHOUT CONTRAST TECHNIQUE: Multidetector CT imaging of the chest, abdomen and pelvis was performed following the standard protocol without IV contrast. RADIATION DOSE REDUCTION: This exam was performed according to the departmental dose-optimization program which includes automated exposure control, adjustment of the mA and/or kV according to patient size and/or use of iterative reconstruction technique. COMPARISON:  Portable chest yesterday and 05/10/2022, thoracic spine CT myelogram images 11/19/17, and abdomen and pelvis CT without and with contrast 08/05/2020. FINDINGS: CT CHEST FINDINGS Cardiovascular: The heart is slightly enlarged. There is no pericardial effusion. There is patchy three-vessel calcific CAD. The pulmonary trunk is upper limits of normal in caliber with minimal air in the anterior pulmonary trunk likely iatrogenic. There are calcific plaques in the arch and descending aorta, scattered calcifications in the great vessels. There is no aortic aneurysm. There are prominent central pulmonary veins. Mediastinum/Nodes: There are slightly prominent lymph nodes in the subcarinal and low right paratracheal spaces, both measure up to 1.2 cm in short axis. No other intrathoracic adenopathy is seen without contrast. Axillary spaces are clear. The trachea, lower poles of the thyroid are unremarkable. There is a moderate-sized hiatal hernia and mild chronic esophageal thickening unchanged since 2019. Lungs/Pleura: There are bilateral minimal layering pleural effusions. Interlobular  septal thickening is  seen in the lung apices and bases consistent with interstitial edema. There is diffuse bronchial thickening, scattered linear scar-like opacities in the lung bases no pulmonary mass or confluent consolidation is seen. Bronchi are clear. Musculoskeletal: There is partially visible lower cervical anterior fusion plating. There is osteopenia, thoracic kyphosis and multilevel advanced degenerative disc disease with slight dextroscoliosis. No acute or significant osseous findings. Surgical clips and dystrophic calcifications are noted chronically in the lower medial left breast. No other significant chest wall findings. CT ABDOMEN PELVIS FINDINGS Hepatobiliary: No focal abnormality is seen without contrast. Old cholecystectomy. No biliary dilatation. Pancreas: Diffuse chronic atrophy is seen. No other focal abnormality. Spleen: No focal abnormality is seen without contrast. No splenomegaly. Adrenals/Urinary Tract: There is no adrenal mass. No focal abnormality of unenhanced renal cortex. Bilateral perinephric stranding appears similar. There is no urinary stone or obstruction. The left side of bladder is obscured by metal artifact from left hip replacement. The remainder of the bladder is unremarkable. Stomach/Bowel: There is inflammatory stranding along side the descending and horizontal segments of the duodenum, with thickened folds. Remainder of the small bowel is unremarkable. The appendix is normal caliber. There is moderate stool retention. There are colonic diverticula without evidence of diverticulitis. Vascular/Lymphatic: Aortic atherosclerosis. No enlarged abdominal or pelvic lymph nodes. Reproductive: Status post hysterectomy. No adnexal masses. Other: There is minimal ascites in the abdomen in the perihepatic spaces, posterior to the descending duodenum, and trace amount in the pelvis. There is mild body wall anasarca. There is no free air, hemorrhage or abscess. Musculoskeletal: Left hip  replacement. Mild lumbar levoscoliosis. Osteopenia and degenerative change lumbar spine most advanced at L4-5 and L5-S1. No concerning regional bone lesion. IMPRESSION: 1. Mild cardiomegaly with prominent central pulmonary veins, interstitial edema in the lung bases and apices and minimal pleural effusions. Findings consistent with mild CHF or fluid overload. 2. Diffuse bronchial thickening which could indicate bronchitis or congestive bronchial thickening. No evidence of bronchopneumonia. 3. A few slightly prominent mediastinal nodes. No bulky or encasing adenopathy. 4. Aortic and coronary artery atherosclerosis and upper limit of normal pulmonary trunk caliber. 5. Minimal air in the pulmonary trunk likely iatrogenic. 6. Moderate-sized hiatal hernia. 7. Paraduodenal stranding most likely due to duodenitis or inflamed ulcerative disease. Not likely related to the diffusely atrophic pancreas but clinical correlation advised. No free air or abscess. 8. Constipation with diverticulosis. 9. Minimal ascites.  Mild body wall edema. Electronically Signed   By: Telford Nab M.D.   On: 05/16/2022 01:38   DG Chest Port 1 View  Result Date: 05/15/2022 CLINICAL DATA:  Weakness, hypotension, bradycardia EXAM: PORTABLE CHEST 1 VIEW COMPARISON:  05/10/2022 FINDINGS: Lungs volumes are small, but are symmetric and are clear. No pneumothorax or pleural effusion. Cardiac size within normal limits. Pulmonary vascularity is normal. Osseous structures are age-appropriate. No acute bone abnormality. IMPRESSION: No active disease. Electronically Signed   By: Fidela Salisbury M.D.   On: 05/15/2022 21:19   DG Chest Port 1 View  Result Date: 05/10/2022 CLINICAL DATA:  Weakness and shortness of breath EXAM: PORTABLE CHEST 1 VIEW COMPARISON:  05/02/2021 FINDINGS: Shallow inspiration. Heart size and pulmonary vascularity are normal for technique. Lungs are clear. No pleural effusions. No pneumothorax. Mediastinal contours appear intact.  Probable esophageal hiatal hernia behind the heart. Postoperative changes in the cervical spine. Degenerative changes in the left upper quadrant. Degenerative changes in the shoulders and spine. IMPRESSION: Shallow inspiration.  No evidence of active pulmonary disease. Electronically Signed   By:  Lucienne Capers M.D.   On: 05/10/2022 23:38   CT HEAD WO CONTRAST (5MM)  Result Date: 05/10/2022 CLINICAL DATA:  Mental status change of unknown cause. Weakness over 1 year, worse in the last few weeks. Unwitnessed fall yesterday. Unsteadiness, dizziness, and confusion. EXAM: CT HEAD WITHOUT CONTRAST TECHNIQUE: Contiguous axial images were obtained from the base of the skull through the vertex without intravenous contrast. RADIATION DOSE REDUCTION: This exam was performed according to the departmental dose-optimization program which includes automated exposure control, adjustment of the mA and/or kV according to patient size and/or use of iterative reconstruction technique. COMPARISON:  09/29/2020 FINDINGS: Brain: Diffuse cerebral atrophy. Ventricular dilatation could be due to central atrophy or normal pressure hydrocephalus. No change since prior study. Low-attenuation changes in the deep white matter consistent with small vessel ischemia. No abnormal extra-axial fluid collections. No mass effect or midline shift. Gray-white matter junctions are distinct. Basal cisterns are not effaced. No acute intracranial hemorrhage. Vascular: No hyperdense vessel or unexpected calcification. Skull: Normal. Negative for fracture or focal lesion. Sinuses/Orbits: Paranasal sinuses and mastoid air cells are clear. Other: None. IMPRESSION: No acute intracranial abnormalities. Chronic atrophy and small vessel ischemic changes. Ventricular dilatation, likely central atrophy but could indicate normal pressure hydrocephalus in the appropriate clinical setting. No change since prior study. Electronically Signed   By: Lucienne Capers M.D.    On: 05/10/2022 23:28    Orson Eva, DO  [facility]  If 7PM-7AM, please contact night-coverage [email or web address] Password [email or web address] 05/18/2022, 4:55 PM   LOS: 2 days

## 2022-05-18 NOTE — Progress Notes (Signed)
Physical Therapy Treatment Patient Details Name: Jeanne Kim MRN: [id number] DOB: 1941/01/22 Today's Date: 05/18/2022   History of Present Illness Jeanne Kim is a 81 y.o. female with medical history significant of Dm2, hypertension, hyperlipidemia, CAD,  L carotid stenosis , hypothyroidism, presents with c/o not feeling well yesterday. "Felt like she was going to die",  Pt having slight difficulty with urination. Pt denies fever, chills, cough, cp, palp, sob, n/v, diarrhea, brbpr, black stool.  EMS brought patient to hypotensive.  Pt notes poor appetite. Pt states she has not been taking her lasix.    PT Comments    Patient demonstrates labored movement for sitting up at bedside with [facility] flat and fair/good return for completing BLE ROM/strengthening exercises while seated at bedside with verbal cues and demonstration.  Patient demonstrates slow labored cadence during gait training without loss of balance and limited mostly due to c/o fatigue.  Patient tolerated sitting up in chair after therapy with her family member present in room.  Patient will benefit from continued skilled physical therapy in hospital and recommended venue below to increase strength, balance, endurance for safe ADLs and gait.        Recommendations for follow up therapy are one component of a multi-disciplinary discharge planning process, led by the attending physician.  Recommendations may be updated based on patient status, additional functional criteria and insurance authorization.  Follow Up Recommendations  Skilled nursing-short term rehab (<3 hours/day) Can patient physically be transported by private vehicle: Yes   Assistance Recommended at Discharge Set up Supervision/Assistance  Patient can return home with the following A little help with walking and/or transfers;A little help with bathing/dressing/bathroom;Assistance with cooking/housework;Help with stairs or ramp for entrance;Assist for  transportation;Direct supervision/assist for medications management   Equipment Recommendations  None recommended by PT    Recommendations for Other Services       Precautions / Restrictions Precautions Precautions: Fall Restrictions Weight Bearing Restrictions: No     Mobility  Bed Mobility Overal bed mobility: Needs Assistance Bed Mobility: Supine to Sit     Supine to sit: Supervision, Min guard     General bed mobility comments: increased time, labored movement    Transfers Overall transfer level: Needs assistance Equipment used: Rolling walker (2 wheels) Transfers: Sit to/from Stand, Bed to chair/wheelchair/BSC Sit to Stand: Min guard, Min assist   Step pivot transfers: Min guard       General transfer comment: slow labored movement    Ambulation/Gait Ambulation/Gait assistance: Min guard, Min assist Gait Distance (Feet): 55 Feet Assistive device: Rolling walker (2 wheels) Gait Pattern/deviations: Decreased step length - right, Decreased step length - left, Decreased stride length Gait velocity: decreased     General Gait Details: slow labored slightly unsteady cadence without loss of balance using RW, limited mostly due to c/o fatigue   Stairs             Wheelchair Mobility    Modified Rankin (Stroke Patients Only)       Balance Overall balance assessment: Needs assistance Sitting-balance support: Feet supported, No upper extremity supported Sitting balance-Leahy Scale: Good Sitting balance - Comments: seated EOB   Standing balance support: During functional activity, Bilateral upper extremity supported Standing balance-Leahy Scale: Fair Standing balance comment: using RW                            Cognition Arousal/Alertness: Awake/alert Behavior During Therapy: WFL for tasks assessed/performed Overall Cognitive  Status: Within Functional Limits for tasks assessed                                           Exercises General Exercises - Lower Extremity Long Arc Quad: Seated, AROM, Strengthening, Both, 15 reps Hip Flexion/Marching: Seated, AROM, Strengthening, Both, 15 reps Toe Raises: Seated, AROM, Strengthening, Both, 15 reps Heel Raises: Seated, AROM, Strengthening, Both, 15 reps    General Comments        Pertinent Vitals/Pain Pain Assessment Pain Assessment: No/denies pain    Home Living                          Prior Function            PT Goals (current goals can now be found in the care plan section) Acute Rehab PT Goals Patient Stated Goal: rehab then home PT Goal Formulation: With patient Time For Goal Achievement: 05/31/22 Potential to Achieve Goals: Good Progress towards PT goals: Progressing toward goals    Frequency    Min 3X/week      PT Plan Current plan remains appropriate    Co-evaluation              AM-PAC PT "6 Clicks" Mobility   Outcome Measure  Help needed turning from your back to your side while in a flat bed without using bedrails?: A Little Help needed moving from lying on your back to sitting on the side of a flat bed without using bedrails?: A Little Help needed moving to and from a bed to a chair (including a wheelchair)?: A Little Help needed standing up from a chair using your arms (e.g., wheelchair or bedside chair)?: A Little Help needed to walk in hospital room?: A Little Help needed climbing 3-5 steps with a railing? : A Lot 6 Click Score: 17    End of Session   Activity Tolerance: Patient tolerated treatment well;Patient limited by fatigue Patient left: in chair;with call bell/phone within reach;with family/visitor present Nurse Communication: Mobility status PT Visit Diagnosis: Unsteadiness on feet (R26.81);Other abnormalities of gait and mobility (R26.89);Muscle weakness (generalized) (M62.81)     Time: 5009-3818 PT Time Calculation (min) (ACUTE ONLY): 28 min  Charges:  $Gait Training: 8-22  mins $Therapeutic Exercise: 8-22 mins                     12:30 PM, 05/18/22 Lonell Grandchild, MPT Physical Therapist with [facility] [phone] office [phone] mobile phone

## 2022-05-19 DIAGNOSIS — R6510 Systemic inflammatory response syndrome (SIRS) of non-infectious origin without acute organ dysfunction: Secondary | ICD-10-CM | POA: Diagnosis not present

## 2022-05-19 DIAGNOSIS — E876 Hypokalemia: Secondary | ICD-10-CM | POA: Diagnosis not present

## 2022-05-19 DIAGNOSIS — G9341 Metabolic encephalopathy: Secondary | ICD-10-CM | POA: Diagnosis not present

## 2022-05-19 LAB — GLUCOSE, CAPILLARY
Glucose-Capillary: 147 mg/dL — ABNORMAL HIGH (ref 70–99)
Glucose-Capillary: 205 mg/dL — ABNORMAL HIGH (ref 70–99)
Glucose-Capillary: 203 mg/dL — ABNORMAL HIGH (ref 70–99)
Glucose-Capillary: 241 mg/dL — ABNORMAL HIGH (ref 70–99)
Glucose-Capillary: 280 mg/dL — ABNORMAL HIGH (ref 70–99)

## 2022-05-19 LAB — MAGNESIUM: Magnesium: 1.6 mg/dL — ABNORMAL LOW (ref 1.7–2.4)

## 2022-05-19 LAB — BASIC METABOLIC PANEL
Anion gap: 6 (ref 5–15)
BUN: 13 mg/dL (ref 8–23)
CO2: 28 mmol/L (ref 22–32)
Calcium: 8.4 mg/dL — ABNORMAL LOW (ref 8.9–10.3)
Chloride: 101 mmol/L (ref 98–111)
Creatinine, Ser: 0.49 mg/dL (ref 0.44–1.00)
GFR, Estimated: >60 mL/min (ref >60)
Glucose, Bld: 119 mg/dL — ABNORMAL HIGH (ref 70–99)
Potassium: 3.3 mmol/L — ABNORMAL LOW (ref 3.5–5.1)
Sodium: 135 mmol/L (ref 135–145)

## 2022-05-19 LAB — CBC WITH DIFFERENTIAL/PLATELET
Abs Immature Granulocytes: 0.19 10*3/uL — ABNORMAL HIGH (ref 0.00–0.07)
Basophils Absolute: 0.1 10*3/uL (ref 0.0–0.1)
Basophils Relative: 1 %
Eosinophils Absolute: 0.6 10*3/uL — ABNORMAL HIGH (ref 0.0–0.5)
Eosinophils Relative: 5 %
HCT: 37 % (ref 36.0–46.0)
Hemoglobin: 11.4 g/dL — ABNORMAL LOW (ref 12.0–15.0)
Immature Granulocytes: 1 %
Lymphocytes Relative: 25 %
Lymphs Abs: 3.4 10*3/uL (ref 0.7–4.0)
MCH: 24.8 pg — ABNORMAL LOW (ref 26.0–34.0)
MCHC: 30.8 g/dL (ref 30.0–36.0)
MCV: 80.6 fL (ref 80.0–100.0)
Monocytes Absolute: 2 10*3/uL — ABNORMAL HIGH (ref 0.1–1.0)
Monocytes Relative: 15 %
Neutro Abs: 7.1 10*3/uL (ref 1.7–7.7)
Neutrophils Relative %: 53 %
Platelets: 456 10*3/uL — ABNORMAL HIGH (ref 150–400)
RBC: 4.59 MIL/uL (ref 3.87–5.11)
RDW: 16.3 % — ABNORMAL HIGH (ref 11.5–15.5)
WBC: 13.4 10*3/uL — ABNORMAL HIGH (ref 4.0–10.5)
nRBC: 0 % (ref 0.0–0.2)

## 2022-05-19 MED ORDER — POLYETHYLENE GLYCOL 3350 17 G PO PACK
17.0000 g | PACK | Freq: Once | ORAL | Status: AC
Start: 2022-05-19 — End: 2022-05-19
  Administered 2022-05-19: 17 g via ORAL
  Filled 2022-05-19: qty 1

## 2022-05-19 MED ORDER — MAGNESIUM SULFATE 2 GM/50ML IV SOLN
2.0000 g | Freq: Once | INTRAVENOUS | Status: AC
  Administered 2022-05-19: 2 g via INTRAVENOUS
  Filled 2022-05-19: qty 50

## 2022-05-19 MED ORDER — POTASSIUM CHLORIDE CRYS ER 20 MEQ PO TBCR
20.0000 meq | EXTENDED_RELEASE_TABLET | Freq: Once | ORAL | Status: AC
  Administered 2022-05-19: 20 meq via ORAL
  Filled 2022-05-19: qty 1

## 2022-05-19 NOTE — Plan of Care (Signed)
  Problem: Education: Goal: Knowledge of General Education information will improve Description: Including pain rating scale, medication(s)/side effects and non-pharmacologic comfort measures Outcome: Progressing   Problem: Nutrition: Goal: Adequate nutrition will be maintained Outcome: Progressing   Problem: Activity: Goal: Risk for activity intolerance will decrease Outcome: Progressing   Problem: Coping: Goal: Level of anxiety will decrease Outcome: Progressing   Problem: Elimination: Goal: Will not experience complications related to bowel motility Outcome: Progressing Goal: Will not experience complications related to urinary retention Outcome: Progressing   Problem: Pain Managment: Goal: General experience of comfort will improve Outcome: Progressing   Problem: Safety: Goal: Ability to remain free from injury will improve Outcome: Progressing   Problem: Coping: Goal: Ability to adjust to condition or change in health will improve Outcome: Progressing   Problem: Skin Integrity: Goal: Risk for impaired skin integrity will decrease Outcome: Progressing

## 2022-05-19 NOTE — Progress Notes (Signed)
Pharmacy consulted for electrolyte replacement protocol:   Goal of therapy: Electrolytes within normal limits:  K 3.5 - 5.1 Corrected Ca 8.9 - 10.3 Phos 2.5 - 4.6 Mg 1.7 - 2.4   Assessment: Lab Results  Component Value Date   CREATININE 0.49 05/19/2022   BUN 13 05/19/2022   NA 135 05/19/2022   K 3.3 (L) 05/19/2022   CL 101 05/19/2022   CO2 28 05/19/2022  Magnesium 1.6   Plan:  potassium chloride 25mq po x 1  Magnesium sulfate 2gm iv x 1  BMP daily ordered Magnesium level ordered for tomorrow AM   GThomasenia Sales PharmD, MBA Clinical Pharmacist

## 2022-05-19 NOTE — Progress Notes (Signed)
Progress Note   Patient: Jeanne Kim NAT:[id number] DOB: 1940-12-05 DOA: 05/15/2022     3 DOS: the patient was seen and examined on 05/19/2022   Brief hospital course: 81 year old female with a history of diabetes mellitus, hypertension, hyperlipidemia, hypothyroidism, coronary artery disease presenting with approximately 1 week history of generalized weakness and decreased oral intake and decreased urine output.  Unfortunately, the patient is a poor historian secondary to some cognitive impairment and acute encephalopathy.  Nevertheless, the patient denies any chest pain, shortness breath, abdominal pain, nausea, vomiting, diarrhea, abdominal pain. In the ED, the patient was noted to be hypothermic with a temperature of 96.6 F.  She was hypotensive with blood pressure 83/36.  She was started on Levophed.  WBC 21.7, hemoglobin 10.0, platelets 429,000.  Sodium 128, potassium 4.3, bicarbonate 24, serum creatinine 1.68.  TSH 9.865.  CT of the chest abdomen pelvis showed some interlobular septal thickening.  There is no cardiomegaly and prominent central pulmonary veins.  There is diffuse bronchial wall thickening.  There was no consolidation.  There is periduodenal stranding without any free air or ascites.  UA was negative for pyuria.  The patient was started vancomycin and cefepime and IV fluids.  She was admitted for further evaluation and treatment.  She was initially started on Levophed. She was ultimately weaned off levophed with IVF resuscitation.  After all cultures remained neg and pt remained stable, vanc and cefepime were discontinued and patient remains clinically stable.  Current barrier for d/c is waiting for insurance authorization.  Assessment and Plan:  SIRS (systemic inflammatory response syndrome) (HCC) Presented with hypotension, tachycardia, and leukocytosis Sepsis ruled out Personally reviewed chest x-ray--no consolidation UA without pyuria Follow blood cultures--neg to  date Discontinue empiric vancomycin and cefepime>>remains stable Initially on IV fluids>>saline locked Lactic acid peaked 2.2 Check PCT <0.10 x 2 BP remains soft>>continue midodrine>>decrease/wean dose   Acute metabolic encephalopathy Initially confused and agitated Secondary to sepsis and acute kidney injury Has underlying cognitive impairment --overall improved, now at baseline   AKI (acute kidney injury) ([place]) Baseline creatinine 0.7-1.0 Presented with serum creatinine 1.68>>0.64 Secondary to volume depletion and hypotension Holding ramipril and metolazone>>BP remains soft/controlled Holding furosemide Improved with IVF>>now saline locked   Hypomagnesemia Repleted Check magnesium in am   Hypokalemia Replete Check cmp in am   Leukemoid reaction Add diff to CBC>>no concerning WBC precursors Pt received dexamathasone 05/15/22 at 2353 --WBC trending down now Check cbc in am  Anemia Check cbc in am   Uncontrolled type 2 diabetes mellitus with hyperglycemia, with long-term current use of insulin (HCC) NovoLog sliding scale 05/17/22  hemoglobin A1c--10.4 Increase semglee to 20 units Holding metformin Add novolog 3 units with meals   CAD (coronary artery disease) No chest pain presently Personally reviewed EKG--junctional rhythm, no ST-T wave changes   Essential hypertension Holding ramipril amlodipine and metoprolol succinate secondary to hypotension Family states that the patient has not been taking her medications properly anyway BP remains soft but stable with midodrine Echo EF 60-65%, no WMA, G1DD, mild MR   Hypothyroidism There has been concern that the patient has not been taking her medications at home TSH 9.865 Restarted levothyroxine      Procedures: As Listed in Progress Note Above   Antibiotics: Vanc 8/9>.8/10 Cefepime 8/9>8/10  Subjective: pt doing well, no complaints, awaiting placement,  pt has been   Physical Exam: Vitals:   05/18/22  1603 05/18/22 2018 05/19/22 0536 05/19/22 0611  BP: 127/76 136/64 131/66  Pulse: 71 73 69   Resp: '17 18 19   '$ Temp: 98 F (36.7 C) 97.6 F (36.4 C) 98 F (36.7 C)   TempSrc: Oral Oral    SpO2: 99% 98% 95%   Weight:    73 kg  Height:       Heent: anicteric Neck: no jvd Heart: rrr s1, s2, no m/g/r Lung: ctab Abd: soft, slightly obese, nt, nd, +bs Ext: no c/c/e Skin: no rash Onychomycosis Lymph: no obvious adenopathy Neuro: nonfocal  Data Reviewed: Na 135, K 3.3 Bun 13, Creatinine 0.49 Magnesium 1.6 Wbc 13.4, Hgb 11.4, Plt 456  Family Communication:   daughter in room 8/12   Consultants:  none   Code Status:  FULL   DVT Prophylaxis:  [place] Lovenox   Disposition: Status is: Inpatient   Planned Discharge Destination: SNF  Time spent: 30 minutes  Author: Jani Gravel, MD 05/19/2022 2:37 PM  For on call review [email or web address].

## 2022-05-20 DIAGNOSIS — N179 Acute kidney failure, unspecified: Secondary | ICD-10-CM | POA: Diagnosis not present

## 2022-05-20 DIAGNOSIS — R6510 Systemic inflammatory response syndrome (SIRS) of non-infectious origin without acute organ dysfunction: Secondary | ICD-10-CM | POA: Diagnosis not present

## 2022-05-20 DIAGNOSIS — G9341 Metabolic encephalopathy: Secondary | ICD-10-CM | POA: Diagnosis not present

## 2022-05-20 DIAGNOSIS — I10 Essential (primary) hypertension: Secondary | ICD-10-CM | POA: Diagnosis not present

## 2022-05-20 LAB — CBC
HCT: 34.2 % — ABNORMAL LOW (ref 36.0–46.0)
Hemoglobin: 10.4 g/dL — ABNORMAL LOW (ref 12.0–15.0)
MCH: 24.5 pg — ABNORMAL LOW (ref 26.0–34.0)
MCHC: 30.4 g/dL (ref 30.0–36.0)
MCV: 80.7 fL (ref 80.0–100.0)
Platelets: 471 10*3/uL — ABNORMAL HIGH (ref 150–400)
RBC: 4.24 MIL/uL (ref 3.87–5.11)
RDW: 16.4 % — ABNORMAL HIGH (ref 11.5–15.5)
WBC: 12.4 10*3/uL — ABNORMAL HIGH (ref 4.0–10.5)
nRBC: 0 % (ref 0.0–0.2)

## 2022-05-20 LAB — COMPREHENSIVE METABOLIC PANEL
ALT: 29 U/L (ref 0–44)
AST: 21 U/L (ref 15–41)
Albumin: 3 g/dL — ABNORMAL LOW (ref 3.5–5.0)
Alkaline Phosphatase: 77 U/L (ref 38–126)
Anion gap: 7 (ref 5–15)
BUN: 22 mg/dL (ref 8–23)
CO2: 25 mmol/L (ref 22–32)
Calcium: 8.2 mg/dL — ABNORMAL LOW (ref 8.9–10.3)
Chloride: 101 mmol/L (ref 98–111)
Creatinine, Ser: 0.54 mg/dL (ref 0.44–1.00)
GFR, Estimated: >60 mL/min (ref >60)
Glucose, Bld: 219 mg/dL — ABNORMAL HIGH (ref 70–99)
Potassium: 3.9 mmol/L (ref 3.5–5.1)
Sodium: 133 mmol/L — ABNORMAL LOW (ref 135–145)
Total Bilirubin: 0.8 mg/dL (ref 0.3–1.2)
Total Protein: 5.9 g/dL — ABNORMAL LOW (ref 6.5–8.1)

## 2022-05-20 LAB — CULTURE, BLOOD (ROUTINE X 2)
Culture: NO GROWTH
Special Requests: ADEQUATE

## 2022-05-20 LAB — GLUCOSE, CAPILLARY
Glucose-Capillary: 236 mg/dL — ABNORMAL HIGH (ref 70–99)
Glucose-Capillary: 276 mg/dL — ABNORMAL HIGH (ref 70–99)
Glucose-Capillary: 174 mg/dL — ABNORMAL HIGH (ref 70–99)
Glucose-Capillary: 213 mg/dL — ABNORMAL HIGH (ref 70–99)

## 2022-05-20 LAB — MAGNESIUM: Magnesium: 1.6 mg/dL — ABNORMAL LOW (ref 1.7–2.4)

## 2022-05-20 MED ORDER — MAGNESIUM SULFATE 2 GM/50ML IV SOLN
2.0000 g | Freq: Once | INTRAVENOUS | Status: AC
  Administered 2022-05-20: 2 g via INTRAVENOUS
  Filled 2022-05-20: qty 50

## 2022-05-20 MED ORDER — MIDODRINE HCL 5 MG PO TABS
2.5000 mg | ORAL_TABLET | Freq: Three times a day (TID) | ORAL | Status: DC
  Administered 2022-05-21 – 2022-05-22 (×5): 2.5 mg via ORAL
  Filled 2022-05-20 (×6): qty 1

## 2022-05-20 NOTE — Progress Notes (Signed)
Pharmacy consulted for electrolyte replacement protocol:   Goal of therapy: Electrolytes within normal limits:  K 3.5 - 5.1 Corrected Ca 8.9 - 10.3 Phos 2.5 - 4.6 Mg 1.7 - 2.4   Assessment: Lab Results  Component Value Date   CREATININE 0.54 05/20/2022   BUN 22 05/20/2022   NA 133 (L) 05/20/2022   K 3.9 05/20/2022   CL 101 05/20/2022   CO2 25 05/20/2022  Magnesium 1.6  Corrected calcium 8.9 WNL  Plan:  Magnesium sulfate 2gm iv x 1  BMP daily ordered Magnesium level ordered for tomorrow AM   Thomasenia Sales, PharmD, MBA Clinical Pharmacist

## 2022-05-20 NOTE — Plan of Care (Signed)
       CROSS COVER NOTE  NAME: Jeanne Kim MRN: [id number] DOB : [birth year]    Date of Service   05/20/22  HPI/Events of Note   A fib noted by telemetry with rate in 70s-80s. Patient asymptomatic and asleep  Interventions   Assessment: Brief episode of ?  New onset paroxysmal A-fib in the setting of SIRS (not captured on EKG)  Plan: Ordered EKG by which time patient had converted back to sinus rhythm with nonspecific ST-T wave changes Continue telemetry Consider cardiology consult in the a.m.

## 2022-05-20 NOTE — Progress Notes (Signed)
Physical Therapy Treatment Patient Details Name: Jeanne Kim MRN: [id number] DOB: 06/17/1941 Today's Date: 05/20/2022   History of Present Illness MATASHA Kim is a 81 y.o. female with medical history significant of Dm2, hypertension, hyperlipidemia, CAD,  L carotid stenosis , hypothyroidism, presents with c/o not feeling well yesterday. "Felt like she was going to die",  Pt having slight difficulty with urination. Pt denies fever, chills, cough, cp, palp, sob, n/v, diarrhea, brbpr, black stool.  EMS brought patient to hypotensive.  Pt notes poor appetite. Pt states she has not been taking her lasix.    PT Comments    Patient presents upright in bed. Patient is awake, alert and cooperative. Patient able to perform bed mobility and transfer with min guard and minimal verbal cueing. Performed seated LE strengthening exercise at bedside with good return. Patient able to stand from bed with min guard assist and able to ambulate increased distance in hallway today. Patient does note LE fatigue and "shakiness" in legs, but demos no LOB. Patient returned to room, and seated upright in bed with HOB elevated. Patient able to reposition self to [facility] with Mod I and verbal cueing. Left patient upright in bed with phone and call bell in reach. Patient will benefit from continued physical therapy in hospital and recommended venue below to increase strength, balance, endurance for safe ADLs and gait.     Recommendations for follow up therapy are one component of a multi-disciplinary discharge planning process, led by the attending physician.  Recommendations may be updated based on patient status, additional functional criteria and insurance authorization.  Follow Up Recommendations  Skilled nursing-short term rehab (<3 hours/day) Can patient physically be transported by private vehicle: Yes   Assistance Recommended at Discharge Set up Supervision/Assistance  Patient can return home with the following A  little help with walking and/or transfers;A little help with bathing/dressing/bathroom;Assistance with cooking/housework;Help with stairs or ramp for entrance;Assist for transportation;Direct supervision/assist for medications management   Equipment Recommendations  None recommended by PT    Recommendations for Other Services       Precautions / Restrictions Precautions Precautions: Fall Restrictions Weight Bearing Restrictions: No     Mobility  Bed Mobility Overal bed mobility: Needs Assistance Bed Mobility: Supine to Sit     Supine to sit: Supervision, Min guard     General bed mobility comments: increased time, labored movement    Transfers Overall transfer level: Needs assistance Equipment used: Rolling walker (2 wheels) Transfers: Sit to/from Stand Sit to Stand: Supervision, Min guard           General transfer comment: slow labored movement    Ambulation/Gait Ambulation/Gait assistance: Supervision, Min guard Gait Distance (Feet): 100 Feet Assistive device: Rolling walker (2 wheels) Gait Pattern/deviations: Decreased step length - right, Decreased step length - left, Decreased stride length       General Gait Details: decreased stride/ gait speed, large turn radius   Stairs             Wheelchair Mobility    Modified Rankin (Stroke Patients Only)       Balance Overall balance assessment: Needs assistance Sitting-balance support: Feet supported, No upper extremity supported Sitting balance-Leahy Scale: Good Sitting balance - Comments: seated EOB   Standing balance support: During functional activity, Bilateral upper extremity supported Standing balance-Leahy Scale: Fair Standing balance comment: using RW  Cognition Arousal/Alertness: Awake/alert Behavior During Therapy: WFL for tasks assessed/performed Overall Cognitive Status: Within Functional Limits for tasks assessed                                           Exercises General Exercises - Lower Extremity Long Arc Quad: Seated, AROM, Strengthening, Both, 10 reps Hip Flexion/Marching: Seated, AROM, Strengthening, Both, 10 reps Toe Raises: Seated, AROM, Strengthening, Both, 10 reps Heel Raises: Seated, AROM, Strengthening, Both, 10 reps    General Comments        Pertinent Vitals/Pain Pain Assessment Pain Assessment: No/denies pain    Home Living                          Prior Function            PT Goals (current goals can now be found in the care plan section) Acute Rehab PT Goals Patient Stated Goal: rehab then home PT Goal Formulation: With patient Time For Goal Achievement: 05/31/22 Potential to Achieve Goals: Good Progress towards PT goals: Progressing toward goals    Frequency    Min 3X/week      PT Plan Current plan remains appropriate    Co-evaluation              AM-PAC PT "6 Clicks" Mobility   Outcome Measure  Help needed turning from your back to your side while in a flat bed without using bedrails?: A Little Help needed moving from lying on your back to sitting on the side of a flat bed without using bedrails?: A Little Help needed moving to and from a bed to a chair (including a wheelchair)?: A Little Help needed standing up from a chair using your arms (e.g., wheelchair or bedside chair)?: A Little Help needed to walk in hospital room?: A Little Help needed climbing 3-5 steps with a railing? : A Lot 6 Click Score: 17    End of Session   Activity Tolerance: Patient tolerated treatment well;Patient limited by fatigue Patient left: with call bell/phone within reach;in bed;with bed alarm set Nurse Communication: Mobility status PT Visit Diagnosis: Unsteadiness on feet (R26.81);Other abnormalities of gait and mobility (R26.89);Muscle weakness (generalized) (M62.81)     Time: 4709-6283 PT Time Calculation (min) (ACUTE ONLY): 20 min  Charges:   $Therapeutic Exercise: 8-22 mins                    12:59 PM, 05/20/22 Josue Hector PT DPT  Physical Therapist with [facility]  [phone]

## 2022-05-20 NOTE — Progress Notes (Signed)
Telemetry reading for Afib. Pt states she has no known history, none noted in H&P. Pt on lovenox for DVT prophylaxis. HR 70-90's. Pt unaware, denies CP/ SOB. Notified Dr. Prudy Feeler. 12 lead EKG ordered.  0210: EKG done. Pt now SR w PAC's. MD aware.

## 2022-05-20 NOTE — Progress Notes (Signed)
Progress Note   Patient: Jeanne Kim:[id number] DOB: January 28, 1941 DOA: 05/15/2022     4 DOS: the patient was seen and examined on 05/20/2022   Brief hospital course: 81 year old female with a history of diabetes mellitus, hypertension, hyperlipidemia, hypothyroidism, coronary artery disease presenting with approximately 1 week history of generalized weakness and decreased oral intake and decreased urine output.  Unfortunately, the patient is a poor historian secondary to some cognitive impairment and acute encephalopathy.  Nevertheless, the patient denies any chest pain, shortness breath, abdominal pain, nausea, vomiting, diarrhea, abdominal pain. In the ED, the patient was noted to be hypothermic with a temperature of 96.6 F.  She was hypotensive with blood pressure 83/36.  She was started on Levophed.  WBC 21.7, hemoglobin 10.0, platelets 429,000.  Sodium 128, potassium 4.3, bicarbonate 24, serum creatinine 1.68.  TSH 9.865.  CT of the chest abdomen pelvis showed some interlobular septal thickening.  There is no cardiomegaly and prominent central pulmonary veins.  There is diffuse bronchial wall thickening.  There was no consolidation.  There is periduodenal stranding without any free air or ascites.  UA was negative for pyuria.  The patient was started vancomycin and cefepime and IV fluids.  She was admitted for further evaluation and treatment.  She was initially started on Levophed. She was ultimately weaned off levophed with IVF resuscitation.  After all cultures remained neg and pt remained stable, vanc and cefepime were discontinued and patient remains clinically stable.  Current barrier for d/c is waiting for insurance authorization.  Assessment and Plan:  SIRS (systemic inflammatory response syndrome) (HCC) Presented with hypotension, tachycardia, and leukocytosis Sepsis ruled out Personally reviewed chest x-ray--no consolidation Blood culture (8/8) -> NGTD Urine culture (8/8) ->  negative MRSA screen (8/8) negative Discontinue empiric vancomycin and cefepime (8/10)>>remains stable Initially on IV fluids>>saline locked Lactic acid peaked 2.2 Check PCT <0.10 x 2 BP remains soft>>continue midodrine>>decrease/wean dose Decrease midodrine from '5mg'$  tid-> 2.'5mg'$  tid (8/11)   Acute metabolic encephalopathy Initially confused and agitated Secondary to sepsis and acute kidney injury Has underlying cognitive impairment --overall improved, now at baseline   AKI (acute kidney injury) ([place]) Baseline creatinine 0.7-1.0 Presented with serum creatinine 1.68>>0.64 Secondary to volume depletion and hypotension Holding ramipril and metolazone>>BP remains soft/controlled Holding furosemide Improved with IVF>>now saline locked   Hypomagnesemia Repleted Check magnesium in am   Hypokalemia Replete Kcl 46mq (8/12) Check cmp in am  Hypomagnesemia Replete 2gm iv x1 (8/13) Check magnesium in am   Leukemoid reaction Add diff to CBC>>no concerning WBC precursors Pt received dexamathasone 05/15/22 at 2353 --WBC trending down now Check cbc in am   Anemia Check cbc in am   Uncontrolled type 2 diabetes mellitus with hyperglycemia, with long-term current use of insulin (HCC) NovoLog sliding scale 05/17/22  hemoglobin A1c--10.4 Increase semglee to 20 units Holding metformin Add novolog 3 units with meals   CAD (coronary artery disease) No chest pain presently Personally reviewed EKG--junctional rhythm, no ST-T wave changes   Essential hypertension Holding ramipril amlodipine and metoprolol succinate secondary to hypotension Family states that the patient has not been taking her medications properly anyway BP remains soft but stable with midodrine Echo EF 60-65%, no WMA, G1DD, mild MR   Hypothyroidism There has been concern that the patient has not been taking her medications at home TSH 9.865 Restarted levothyroxine (8/9)   DVT prophylaxis:  Lovenox Code Status:  Full Code Family Communication: family is present in room 8/13  Procedures: As Listed in  Progress Note Above   Antibiotics: Vanc 8/9>.8/10 Cefepime 8/9>8/10   Subjective: pt afebrile overnite.    Physical Exam: Vitals:   05/19/22 2113 05/20/22 0430 05/20/22 0500 05/20/22 1417  BP: (!) 131/56 129/65  (!) 158/76  Pulse: 79 83  93  Resp: '20 18  18  '$ Temp: 98.2 F (36.8 C) 98.2 F (36.8 C)  97.8 F (36.6 C)  TempSrc: Oral Oral  Oral  SpO2: 98% 97%  96%  Weight:   72.7 kg   Height:       Heent: anicteric Neck: no jvd Heart: rrr s1, s2, no m/g/r Lung: ctab Abd: soft, slightly obese, nt, nd, +bs Ext: no c/c/e Skin: no rash Onychomycosis Lymph: no obvious adenopathy Neuro: nonfocal  Data Reviewed:  Disposition: Status is: Inpatient, pt admitted for SIRS requiring iv abx, and bp support.   Planned Discharge Destination: SNF  Time spent: 45 minutes  Author: Jani Gravel, MD 05/20/2022 5:06 PM  For on call review [email or web address].

## 2022-05-21 DIAGNOSIS — R6510 Systemic inflammatory response syndrome (SIRS) of non-infectious origin without acute organ dysfunction: Secondary | ICD-10-CM | POA: Diagnosis not present

## 2022-05-21 LAB — PROCALCITONIN: Procalcitonin: <0.1 ng/mL

## 2022-05-21 LAB — CBC
HCT: 37.3 % (ref 36.0–46.0)
Hemoglobin: 11.3 g/dL — ABNORMAL LOW (ref 12.0–15.0)
MCH: 24.6 pg — ABNORMAL LOW (ref 26.0–34.0)
MCHC: 30.3 g/dL (ref 30.0–36.0)
MCV: 81.1 fL (ref 80.0–100.0)
Platelets: 500 10*3/uL — ABNORMAL HIGH (ref 150–400)
RBC: 4.6 MIL/uL (ref 3.87–5.11)
RDW: 16.9 % — ABNORMAL HIGH (ref 11.5–15.5)
WBC: 16.4 10*3/uL — ABNORMAL HIGH (ref 4.0–10.5)
nRBC: 0 % (ref 0.0–0.2)

## 2022-05-21 LAB — BASIC METABOLIC PANEL
Anion gap: 8 (ref 5–15)
BUN: 20 mg/dL (ref 8–23)
CO2: 27 mmol/L (ref 22–32)
Calcium: 8.8 mg/dL — ABNORMAL LOW (ref 8.9–10.3)
Chloride: 100 mmol/L (ref 98–111)
Creatinine, Ser: 0.63 mg/dL (ref 0.44–1.00)
GFR, Estimated: >60 mL/min (ref >60)
Glucose, Bld: 145 mg/dL — ABNORMAL HIGH (ref 70–99)
Potassium: 3.7 mmol/L (ref 3.5–5.1)
Sodium: 135 mmol/L (ref 135–145)

## 2022-05-21 LAB — LACTIC ACID, PLASMA: Lactic Acid, Venous: 0.9 mmol/L (ref 0.5–1.9)

## 2022-05-21 LAB — GLUCOSE, CAPILLARY
Glucose-Capillary: 158 mg/dL — ABNORMAL HIGH (ref 70–99)
Glucose-Capillary: 168 mg/dL — ABNORMAL HIGH (ref 70–99)
Glucose-Capillary: 130 mg/dL — ABNORMAL HIGH (ref 70–99)
Glucose-Capillary: 188 mg/dL — ABNORMAL HIGH (ref 70–99)

## 2022-05-21 LAB — MAGNESIUM: Magnesium: 1.8 mg/dL (ref 1.7–2.4)

## 2022-05-21 MED ORDER — LINACLOTIDE 290 MCG PO CAPS: 290.0000 ug | ORAL_CAPSULE | Freq: Every day | ORAL | 1 refills | Status: DC

## 2022-05-21 MED ORDER — MIDODRINE HCL 2.5 MG PO TABS: 2.5000 mg | ORAL_TABLET | Freq: Three times a day (TID) | ORAL | 0 refills | Status: DC

## 2022-05-21 NOTE — Care Management Important Message (Signed)
Important Message  Patient Details  Name: Jeanne Kim MRN: [id number] Date of Birth: 1941/05/12   Medicare Important Message Given:  Yes     Tommy Medal 05/21/2022, 10:23 AM

## 2022-05-21 NOTE — TOC Progression Note (Signed)
Transition of Care [facility]) - Progression Note    Patient Details  Name: Jeanne Kim MRN: [id number] Date of Birth: 1941/03/03  Transition of [facility] CM/SW Contact  Shade Flood, LCSW Phone Number: 05/21/2022, 4:21 PM  Clinical Narrative:     Received call from [facility] stating that [name] called her to inform of denial for SNF and option for peer to peer appeal. Updated MD on information for peer to peer. Will follow up in AM.  Expected Discharge Plan: [place] Barriers to Discharge: Continued Medical Work up  Expected Discharge Plan and Services Expected Discharge Plan: [place] In-house Referral: Clinical Social Work Discharge Planning Services: CM Consult Post Acute Care Choice: [place] arrangements for the past 2 months: Single Family Home Expected Discharge Date: 05/21/22                                     Social Determinants of Health (SDOH) Interventions    Readmission Risk Interventions    05/16/2022   11:25 AM  Readmission Risk Prevention Plan  Transportation Screening Complete  Medication Review Press photographer) Complete  HRI or [place] Complete  SW Recovery Care/Counseling Consult Complete  [place] Not Applicable

## 2022-05-21 NOTE — Plan of Care (Signed)
  Problem: Education: Goal: Knowledge of General Education information will improve Description Including pain rating scale, medication(s)/side effects and non-pharmacologic comfort measures Outcome: Progressing   Problem: Health Behavior/Discharge Planning: Goal: Ability to manage health-related needs will improve Outcome: Progressing   

## 2022-05-21 NOTE — Discharge Summary (Signed)
Physician Discharge Summary   Patient: Jeanne Kim MRN: [id number] DOB: 03-31-1941  Admit date:     05/15/2022  Discharge date: 05/21/22  Discharge Physician: Deatra James   PCP: Neale Burly, MD   Recommendations at discharge:  Follow-up with a PCP in 1-2 weeks Continue current medication, close monitoring of BP   Discharge Diagnoses: Principal Problem:   SIRS (systemic inflammatory response syndrome) (HCC) Active Problems:   Acute metabolic encephalopathy   AKI (acute kidney injury) ([place])   Hypothyroidism   Essential hypertension   CAD (coronary artery disease)   Uncontrolled type 2 diabetes mellitus with hyperglycemia, with long-term current use of insulin (HCC)   Leukemoid reaction   Hypokalemia   Hypomagnesemia  Resolved Problems:   * No resolved hospital problems. *  Hospital Course: Jeanne Kim 81 year old female with a history of diabetes mellitus, hypertension, hyperlipidemia, hypothyroidism, coronary artery disease presenting with approximately 1 week history of generalized weakness and decreased oral intake and decreased urine output.  Unfortunately, the patient is a poor historian secondary to some cognitive impairment and acute encephalopathy.  Nevertheless, the patient denies any chest pain, shortness breath, abdominal pain, nausea, vomiting, diarrhea, abdominal pain. In the ED, the patient was noted to be hypothermic with a temperature of 96.6 F.  She was hypotensive with blood pressure 83/36.  She was started on Levophed.  WBC 21.7, hemoglobin 10.0, platelets 429,000.  Sodium 128, potassium 4.3, bicarbonate 24, serum creatinine 1.68.  TSH 9.865.  CT of the chest abdomen pelvis showed some interlobular septal thickening.  There is no cardiomegaly and prominent central pulmonary veins.  There is diffuse bronchial wall thickening.  There was no consolidation.  There is periduodenal stranding without any free air or ascites.  UA was negative for pyuria.  The  patient was started vancomycin and cefepime and IV fluids.  She was admitted for further evaluation and treatment.  She was initially started on Levophed. She was ultimately weaned off levophed with IVF resuscitation.  After all cultures remained neg and pt remained stable, vanc and cefepime were discontinued and patient remains clinically stable.  Current barrier for d/c is waiting for insurance authorization.  Assessment and Plan: * SIRS (systemic inflammatory response syndrome) (HCC) Presented with hypotension, tachycardia, and leukocytosis Sepsis ruled out Personally reviewed chest x-ray--no consolidation UA without pyuria Follow blood cultures--neg to date Discontinue empiric vancomycin and cefepime>>remains stable Initially on IV fluids>>saline locked Lactic acid peaked 2.2 Check PCT <0.10 x 2 BP remains soft>>continue midodrine>>decrease/wean dose  Acute metabolic encephalopathy Initially confused and agitated Secondary to sepsis and acute kidney injury Has underlying cognitive impairment --overall improved, at baseline now   AKI (acute kidney injury) ([place]) Baseline creatinine 0.7-1.0 Presented with serum creatinine 1.68>>0.64 Secondary to volume depletion and hypotension Ramipril and metolazone>> were on HOLD \ BP remains soft/controlled Resume urosemide Improved with IVF>>now saline D/Ced   Hypomagnesemia repleted  Hypokalemia replete  Leukemoid reaction Add diff to CBC>>no concerning WBC precursors Pt received dexamathasone 05/15/22 at 2353   Uncontrolled type 2 diabetes mellitus with hyperglycemia, with long-term current use of insulin (HCC) NovoLog sliding scale 05/17/22  hemoglobin A1c--10.4 Discontinued metformin Resuming home regimen  CAD (coronary artery disease) No chest pain presently Personally reviewed EKG--junctional rhythm, no ST-T wave changes  Essential hypertension D/ced Ramipril amlodipine and metoprolol succinate secondary to  hypotension Family states that the patient has not been taking her medications properly anyway BP remains soft but stable with midodrine Echo EF 60-65%, no WMA,  G1DD, mild MR  Hypothyroidism There has been concern that the patient has not been taking her medications at home TSH 9.865 Restarted levothyroxine     Consultants: None  Procedures performed: none   Disposition: Nursing home Diet recommendation:  Discharge Diet Orders (From admission, onward)     Start     Ordered   05/21/22 0000  Diet - low sodium heart healthy        05/21/22 0939           Cardiac and Carb modified diet DISCHARGE MEDICATION: Allergies as of 05/21/2022       Reactions   Codeine Hives   Sulfonamide Derivatives Hives   Canagliflozin    Penicillins Hives   Pt states she does NOT have allergy to penicillin   Sulfa Antibiotics Other (See Comments)   unknown        Medication List     STOP taking these medications    amLODipine 5 MG tablet Commonly known as: NORVASC   metFORMIN 1000 MG tablet Commonly known as: GLUCOPHAGE   metoprolol succinate 50 MG 24 hr tablet Commonly known as: TOPROL-XL   pantoprazole 40 MG tablet Commonly known as: PROTONIX       TAKE these medications    Aspirin Low Dose 81 MG tablet Generic drug: aspirin EC Take 81 mg by mouth every morning.   DULoxetine 60 MG capsule Commonly known as: CYMBALTA Take 60 mg by mouth daily.   EPINEPHrine 0.3 mg/0.3 mL Soaj injection Commonly known as: EPI-PEN Inject 0.3 mg into the muscle daily as needed for anaphylaxis.   furosemide 20 MG tablet Commonly known as: LASIX Take 20 mg by mouth daily.   Insulin Glargine-Lixisenatide 100-33 UNT-MCG/ML Sopn Inject 20-25 Units into the skin daily. Pt adjust between 20-25 units   Soliqua 100-33 UNT-MCG/ML Sopn Generic drug: Insulin Glargine-Lixisenatide Inject 0-20 Units into the skin daily. Sliding Scale   Jardiance 25 MG Tabs tablet Generic drug:  empagliflozin Take 25 mg by mouth daily.   levothyroxine 150 MCG tablet Commonly known as: SYNTHROID Take 1 tablet (150 mcg total) by mouth daily at 6 (six) AM.   linaclotide 290 MCG Caps capsule Commonly known as: LINZESS Take 1 capsule (290 mcg total) by mouth daily before breakfast. Start taking on: May 22, 2022 What changed:  when to take this additional instructions   Melatonin 10 MG Tabs Take 10 mg by mouth at bedtime as needed (sleep).   midodrine 2.5 MG tablet Commonly known as: PROAMATINE Take 1 tablet (2.5 mg total) by mouth 3 (three) times daily with meals. What changed:  medication strength how much to take when to take this        Contact information for after-discharge care     [facility] .   Service: Skilled Nursing Contact information: [address] [place] [phone]                    Discharge Exam: Filed Weights   05/19/22 5732 05/20/22 0500 05/21/22 0500  Weight: 73 kg 72.7 kg 71.9 kg      Physical Exam:   General:  AAO x 2,  cooperative, no distress;   HEENT:  Normocephalic, PERRL, otherwise with in Normal limits   Neuro:  CNII-XII intact. , normal motor and sensation, reflexes intact   Lungs:   Clear to auscultation BL, Respirations unlabored,  No wheezes / crackles  Cardio:  S1/S2, RRR, No murmure, No Rubs or Gallops   Abdomen:  Soft, non-tender, bowel sounds active all four quadrants, no guarding or peritoneal signs.  Muscular  skeletal:  Limited exam -global generalized weaknesses - in bed, able to move all 4 extremities,   2+ pulses,  symmetric, No pitting edema  Skin:  Dry, warm to touch, negative for any Rashes,  Wounds: Please see nursing documentation          Condition at discharge: good  The results of significant diagnostics from this hospitalization (including imaging, microbiology, ancillary and laboratory) are listed  below for reference.   Imaging Studies: ECHOCARDIOGRAM COMPLETE  Result Date: 05/18/2022    ECHOCARDIOGRAM REPORT   Patient Name:   Jeanne Kim Date of Exam: 05/18/2022 Medical Rec #:  [id number]      Height:       66.0 in Accession #:    [id number]     Weight:       164.0 lb Date of Birth:  [birth year]      BSA:          1.838 m Patient Age:    5 years       BP:           112/64 mmHg Patient Gender: F              HR:           83 bpm. Exam Location:  [facility] Procedure: 2D Echo, Cardiac Doppler and Color Doppler Indications:    Dyspnea  History:        Patient has prior history of Echocardiogram examinations, most                 recent 09/28/2020. CAD, Signs/Symptoms:Chest Pain, Syncope,                 Dyspnea and Dizziness/Lightheadedness; Risk                 Factors:Hypertension, Diabetes and Dyslipidemia. Breast CA.  Sonographer:    Wenda Low Referring Phys: [phone] DAVID TAT IMPRESSIONS  1. Left ventricular ejection fraction, by estimation, is 60 to 65%. The left ventricle has normal function. The left ventricle has no regional wall motion abnormalities. There is mild left ventricular hypertrophy. Left ventricular diastolic parameters are consistent with Grade I diastolic dysfunction (impaired relaxation).  2. Right ventricular systolic function is normal. The right ventricular size is normal. Tricuspid regurgitation signal is inadequate for assessing PA pressure.  3. Left atrial size was moderately dilated.  4. The mitral valve is abnormal. Mild mitral valve regurgitation. No evidence of mitral stenosis.  5. The tricuspid valve is abnormal.  6. The aortic valve is tricuspid. There is mild calcification of the aortic valve. There is mild thickening of the aortic valve. Aortic valve regurgitation is mild.  7. Aortic dilatation noted. There is mild dilatation of the ascending aorta, measuring 37 mm.  8. The inferior vena cava is normal in size with greater than 50% respiratory variability,  suggesting right atrial pressure of 3 mmHg. FINDINGS  Left Ventricle: Left ventricular ejection fraction, by estimation, is 60 to 65%. The left ventricle has normal function. The left ventricle has no regional wall motion abnormalities. The left ventricular internal cavity size was normal in size. There is  mild left ventricular hypertrophy. Left ventricular diastolic parameters are consistent with Grade I diastolic dysfunction (impaired relaxation). Normal left ventricular filling pressure. Right Ventricle: The right ventricular size is normal. Right  vetricular wall thickness was not well visualized. Right ventricular systolic function is normal. Tricuspid regurgitation signal is inadequate for assessing PA pressure. Left Atrium: Left atrial size was moderately dilated. Right Atrium: Right atrial size was normal in size. Pericardium: There is no evidence of pericardial effusion. Mitral Valve: The mitral valve is abnormal. There is mild thickening of the mitral valve leaflet(s). There is mild calcification of the mitral valve leaflet(s). Mild mitral annular calcification. Mild mitral valve regurgitation. No evidence of mitral valve stenosis. MV peak gradient, 5.1 mmHg. The mean mitral valve gradient is 2.0 mmHg. Tricuspid Valve: The tricuspid valve is abnormal. Tricuspid valve regurgitation is mild . No evidence of tricuspid stenosis. Aortic Valve: The aortic valve is tricuspid. There is mild calcification of the aortic valve. There is mild thickening of the aortic valve. There is mild aortic valve annular calcification. Aortic valve regurgitation is mild. Aortic valve mean gradient measures 4.0 mmHg. Aortic valve peak gradient measures 8.1 mmHg. Aortic valve area, by VTI measures 2.16 cm. Pulmonic Valve: The pulmonic valve was not well visualized. Pulmonic valve regurgitation is not visualized. No evidence of pulmonic stenosis. Aorta: The aortic root is normal in size and structure and aortic dilatation noted.  There is mild dilatation of the ascending aorta, measuring 37 mm. Venous: The inferior vena cava is normal in size with greater than 50% respiratory variability, suggesting right atrial pressure of 3 mmHg. IAS/Shunts: No atrial level shunt detected by color flow Doppler.  LEFT VENTRICLE PLAX 2D LVIDd:         4.80 cm     Diastology LVIDs:         2.90 cm     LV e' medial:    5.77 cm/s LV PW:         1.10 cm     LV E/e' medial:  16.3 LV IVS:        1.20 cm     LV e' lateral:   9.57 cm/s LVOT diam:     1.90 cm     LV E/e' lateral: 9.8 LV SV:         68 LV SV Index:   37 LVOT Area:     2.84 cm  LV Volumes (MOD) LV vol d, MOD A2C: 39.6 ml LV vol d, MOD A4C: 33.2 ml LV vol s, MOD A2C: 16.1 ml LV vol s, MOD A4C: 14.5 ml LV SV MOD A2C:     23.5 ml LV SV MOD A4C:     33.2 ml LV SV MOD BP:      20.6 ml RIGHT VENTRICLE RV Basal diam:  3.30 cm RV Mid diam:    3.10 cm RV S prime:     17.60 cm/s TAPSE (M-mode): 2.2 cm LEFT ATRIUM             Index        RIGHT ATRIUM           Index LA diam:        3.60 cm 1.96 cm/m   RA Area:     13.70 cm LA Vol (A2C):   91.3 ml 49.67 ml/m  RA Volume:   33.70 ml  18.33 ml/m LA Vol (A4C):   70.5 ml 38.35 ml/m LA Biplane Vol: 82.0 ml 44.61 ml/m  AORTIC VALVE                    PULMONIC VALVE AV Area (Vmax):    2.18 cm  PV Vmax:       0.78 m/s AV Area (Vmean):   2.04 cm     PV Peak grad:  2.4 mmHg AV Area (VTI):     2.16 cm AV Vmax:           142.00 cm/s AV Vmean:          91.050 cm/s AV VTI:            0.315 m AV Peak Grad:      8.1 mmHg AV Mean Grad:      4.0 mmHg LVOT Vmax:         109.00 cm/s LVOT Vmean:        65.400 cm/s LVOT VTI:          0.240 m LVOT/AV VTI ratio: 0.76  AORTA Ao Root diam: 3.20 cm Ao Asc diam:  3.70 cm MITRAL VALVE MV Area (PHT): 3.34 cm     SHUNTS MV Area VTI:   1.90 cm     Systemic VTI:  0.24 m MV Peak grad:  5.1 mmHg     Systemic Diam: 1.90 cm MV Mean grad:  2.0 mmHg MV Vmax:       1.13 m/s MV Vmean:      58.3 cm/s MV Decel Time: 227 msec MV E velocity:  93.80 cm/s MV A velocity: 104.00 cm/s MV E/A ratio:  0.90 Carlyle Dolly MD Electronically signed by Carlyle Dolly MD Signature Date/Time: 05/18/2022/2:39:05 PM    Final    CT CHEST ABDOMEN PELVIS WO CONTRAST  Result Date: 05/16/2022 CLINICAL DATA:  Sepsis, weakness, hypotensive and bradycardic. Limited p.o. intake past few days. EXAM: CT CHEST, ABDOMEN AND PELVIS WITHOUT CONTRAST TECHNIQUE: Multidetector CT imaging of the chest, abdomen and pelvis was performed following the standard protocol without IV contrast. RADIATION DOSE REDUCTION: This exam was performed according to the departmental dose-optimization program which includes automated exposure control, adjustment of the mA and/or kV according to patient size and/or use of iterative reconstruction technique. COMPARISON:  Portable chest yesterday and 05/10/2022, thoracic spine CT myelogram images 11/19/17, and abdomen and pelvis CT without and with contrast 08/05/2020. FINDINGS: CT CHEST FINDINGS Cardiovascular: The heart is slightly enlarged. There is no pericardial effusion. There is patchy three-vessel calcific CAD. The pulmonary trunk is upper limits of normal in caliber with minimal air in the anterior pulmonary trunk likely iatrogenic. There are calcific plaques in the arch and descending aorta, scattered calcifications in the great vessels. There is no aortic aneurysm. There are prominent central pulmonary veins. Mediastinum/Nodes: There are slightly prominent lymph nodes in the subcarinal and low right paratracheal spaces, both measure up to 1.2 cm in short axis. No other intrathoracic adenopathy is seen without contrast. Axillary spaces are clear. The trachea, lower poles of the thyroid are unremarkable. There is a moderate-sized hiatal hernia and mild chronic esophageal thickening unchanged since 2019. Lungs/Pleura: There are bilateral minimal layering pleural effusions. Interlobular septal thickening is seen in the lung apices and bases  consistent with interstitial edema. There is diffuse bronchial thickening, scattered linear scar-like opacities in the lung bases no pulmonary mass or confluent consolidation is seen. Bronchi are clear. Musculoskeletal: There is partially visible lower cervical anterior fusion plating. There is osteopenia, thoracic kyphosis and multilevel advanced degenerative disc disease with slight dextroscoliosis. No acute or significant osseous findings. Surgical clips and dystrophic calcifications are noted chronically in the lower medial left breast. No other significant chest wall findings. CT ABDOMEN PELVIS FINDINGS Hepatobiliary: No focal abnormality  is seen without contrast. Old cholecystectomy. No biliary dilatation. Pancreas: Diffuse chronic atrophy is seen. No other focal abnormality. Spleen: No focal abnormality is seen without contrast. No splenomegaly. Adrenals/Urinary Tract: There is no adrenal mass. No focal abnormality of unenhanced renal cortex. Bilateral perinephric stranding appears similar. There is no urinary stone or obstruction. The left side of bladder is obscured by metal artifact from left hip replacement. The remainder of the bladder is unremarkable. Stomach/Bowel: There is inflammatory stranding along side the descending and horizontal segments of the duodenum, with thickened folds. Remainder of the small bowel is unremarkable. The appendix is normal caliber. There is moderate stool retention. There are colonic diverticula without evidence of diverticulitis. Vascular/Lymphatic: Aortic atherosclerosis. No enlarged abdominal or pelvic lymph nodes. Reproductive: Status post hysterectomy. No adnexal masses. Other: There is minimal ascites in the abdomen in the perihepatic spaces, posterior to the descending duodenum, and trace amount in the pelvis. There is mild body wall anasarca. There is no free air, hemorrhage or abscess. Musculoskeletal: Left hip replacement. Mild lumbar levoscoliosis. Osteopenia and  degenerative change lumbar spine most advanced at L4-5 and L5-S1. No concerning regional bone lesion. IMPRESSION: 1. Mild cardiomegaly with prominent central pulmonary veins, interstitial edema in the lung bases and apices and minimal pleural effusions. Findings consistent with mild CHF or fluid overload. 2. Diffuse bronchial thickening which could indicate bronchitis or congestive bronchial thickening. No evidence of bronchopneumonia. 3. A few slightly prominent mediastinal nodes. No bulky or encasing adenopathy. 4. Aortic and coronary artery atherosclerosis and upper limit of normal pulmonary trunk caliber. 5. Minimal air in the pulmonary trunk likely iatrogenic. 6. Moderate-sized hiatal hernia. 7. Paraduodenal stranding most likely due to duodenitis or inflamed ulcerative disease. Not likely related to the diffusely atrophic pancreas but clinical correlation advised. No free air or abscess. 8. Constipation with diverticulosis. 9. Minimal ascites.  Mild body wall edema. Electronically Signed   By: Telford Nab M.D.   On: 05/16/2022 01:38   DG Chest Port 1 View  Result Date: 05/15/2022 CLINICAL DATA:  Weakness, hypotension, bradycardia EXAM: PORTABLE CHEST 1 VIEW COMPARISON:  05/10/2022 FINDINGS: Lungs volumes are small, but are symmetric and are clear. No pneumothorax or pleural effusion. Cardiac size within normal limits. Pulmonary vascularity is normal. Osseous structures are age-appropriate. No acute bone abnormality. IMPRESSION: No active disease. Electronically Signed   By: Fidela Salisbury M.D.   On: 05/15/2022 21:19   DG Chest Port 1 View  Result Date: 05/10/2022 CLINICAL DATA:  Weakness and shortness of breath EXAM: PORTABLE CHEST 1 VIEW COMPARISON:  05/02/2021 FINDINGS: Shallow inspiration. Heart size and pulmonary vascularity are normal for technique. Lungs are clear. No pleural effusions. No pneumothorax. Mediastinal contours appear intact. Probable esophageal hiatal hernia behind the heart.  Postoperative changes in the cervical spine. Degenerative changes in the left upper quadrant. Degenerative changes in the shoulders and spine. IMPRESSION: Shallow inspiration.  No evidence of active pulmonary disease. Electronically Signed   By: Lucienne Capers M.D.   On: 05/10/2022 23:38   CT HEAD WO CONTRAST (5MM)  Result Date: 05/10/2022 CLINICAL DATA:  Mental status change of unknown cause. Weakness over 1 year, worse in the last few weeks. Unwitnessed fall yesterday. Unsteadiness, dizziness, and confusion. EXAM: CT HEAD WITHOUT CONTRAST TECHNIQUE: Contiguous axial images were obtained from the base of the skull through the vertex without intravenous contrast. RADIATION DOSE REDUCTION: This exam was performed according to the departmental dose-optimization program which includes automated exposure control, adjustment of the mA and/or kV  according to patient size and/or use of iterative reconstruction technique. COMPARISON:  09/29/2020 FINDINGS: Brain: Diffuse cerebral atrophy. Ventricular dilatation could be due to central atrophy or normal pressure hydrocephalus. No change since prior study. Low-attenuation changes in the deep white matter consistent with small vessel ischemia. No abnormal extra-axial fluid collections. No mass effect or midline shift. Gray-white matter junctions are distinct. Basal cisterns are not effaced. No acute intracranial hemorrhage. Vascular: No hyperdense vessel or unexpected calcification. Skull: Normal. Negative for fracture or focal lesion. Sinuses/Orbits: Paranasal sinuses and mastoid air cells are clear. Other: None. IMPRESSION: No acute intracranial abnormalities. Chronic atrophy and small vessel ischemic changes. Ventricular dilatation, likely central atrophy but could indicate normal pressure hydrocephalus in the appropriate clinical setting. No change since prior study. Electronically Signed   By: Lucienne Capers M.D.   On: 05/10/2022 23:28    Microbiology: Results  for orders placed or performed during the hospital encounter of 05/15/22  Culture, blood (routine x 2)     Status: None   Collection Time: 05/15/22  8:52 PM   Specimen: BLOOD RIGHT FOREARM  Result Value Ref Range Status   Specimen Description BLOOD RIGHT FOREARM  Final   Special Requests   Final    BOTTLES DRAWN AEROBIC AND ANAEROBIC Blood Culture adequate volume   Culture   Final    NO GROWTH 5 DAYS Performed at [facility], [address] [place], [place] [address]    Report Status 05/20/2022 FINAL  Final  Urine Culture     Status: None   Collection Time: 05/15/22 11:20 PM   Specimen: In/Out Cath Urine  Result Value Ref Range Status   Specimen Description   Final    IN/OUT CATH URINE Performed at [facility], [address], [place], [place] [address]    Special Requests   Final    NONE Performed at [facility], [address] [place], [place] [address]    Culture   Final    NO GROWTH Performed at [facility], [place] [address] [place], [place] [address]    Report Status 05/17/2022 FINAL  Final  Resp Panel by RT-PCR (Flu A&B, Covid) Anterior Nasal Swab     Status: None   Collection Time: 05/16/22  3:17 AM   Specimen: Anterior Nasal Swab  Result Value Ref Range Status   SARS Coronavirus 2 by RT PCR NEGATIVE NEGATIVE Final    Comment: (NOTE) SARS-CoV-2 target nucleic acids are NOT DETECTED.  The SARS-CoV-2 RNA is generally detectable in upper respiratory specimens during the acute phase of infection. The lowest concentration of SARS-CoV-2 viral copies this assay can detect is 138 copies/mL. A negative result does not preclude SARS-Cov-2 infection and should not be used as the sole basis for treatment or other patient management decisions. A negative result may occur with  improper specimen collection/handling, submission of specimen other than nasopharyngeal swab, presence of viral mutation(s) within the areas targeted by this assay, and inadequate number of  viral copies(<138 copies/mL). A negative result must be combined with clinical observations, patient history, and epidemiological information. The expected result is Negative.  Fact Sheet for Patients:  EntrepreneurPulse.com.au  Fact Sheet for Healthcare Providers:  IncredibleEmployment.be  This test is no t yet approved or cleared by the [place] FDA and  has been authorized for detection and/or diagnosis of SARS-CoV-2 by FDA under an Emergency Use Authorization (EUA). This EUA will remain  in effect (meaning this test can be used) for the duration of  the COVID-19 declaration under Section 564(b)(1) of the Act, 21 U.S.C.section 360bbb-3(b)(1), unless the authorization is terminated  or revoked sooner.       Influenza A by PCR NEGATIVE NEGATIVE Final   Influenza B by PCR NEGATIVE NEGATIVE Final    Comment: (NOTE) The Xpert Xpress SARS-CoV-2/FLU/RSV plus assay is intended as an aid in the diagnosis of influenza from Nasopharyngeal swab specimens and should not be used as a sole basis for treatment. Nasal washings and aspirates are unacceptable for Xpert Xpress SARS-CoV-2/FLU/RSV testing.  Fact Sheet for Patients: EntrepreneurPulse.com.au  Fact Sheet for Healthcare Providers: IncredibleEmployment.be  This test is not yet approved or cleared by the [place] FDA and has been authorized for detection and/or diagnosis of SARS-CoV-2 by FDA under an Emergency Use Authorization (EUA). This EUA will remain in effect (meaning this test can be used) for the duration of the COVID-19 declaration under Section 564(b)(1) of the Act, 21 U.S.C. section 360bbb-3(b)(1), unless the authorization is terminated or revoked.  Performed at [facility], [address], [place], [place] [address]   MRSA Next Gen by PCR, Nasal     Status: None   Collection Time: 05/16/22 11:02 AM   Specimen: Nasal Mucosa; Nasal Swab   Result Value Ref Range Status   MRSA by PCR Next Gen NOT DETECTED NOT DETECTED Final    Comment: (NOTE) The GeneXpert MRSA Assay (FDA approved for NASAL specimens only), is one component of a comprehensive MRSA colonization surveillance program. It is not intended to diagnose MRSA infection nor to guide or monitor treatment for MRSA infections. Test performance is not FDA approved in patients less than 48 years old. Performed at [facility], [address] [place], [place] [address]     Labs: CBC: Recent Labs  Lab 05/15/22 2047 05/16/22 0730 05/17/22 0343 05/18/22 0603 05/19/22 6283 05/20/22 0549 05/21/22 0557  WBC 21.7*   < > 29.6* 18.3* 13.4* 12.4* 16.4*  NEUTROABS 15.9*  --   --  12.0* 7.1  --   --   HGB 10.0*   < > 10.0* 10.3* 11.4* 10.4* 11.3*  HCT 32.7*   < > 31.3* 33.9* 37.0 34.2* 37.3  MCV 80.7   < > 78.4* 80.5 80.6 80.7 81.1  PLT 429*   < > 458* 439* 456* 471* 500*   < > = values in this interval not displayed.   Basic Metabolic Panel: Recent Labs  Lab 05/17/22 0343 05/18/22 0603 05/19/22 0613 05/20/22 0549 05/21/22 0557  NA 131* 137 135 133* 135  K 3.1* 3.0* 3.3* 3.9 3.7  CL 97* 101 101 101 100  CO2 '24 28 28 25 27  '$ GLUCOSE 273* 115* 119* 219* 145*  BUN 24* '17 13 22 20  '$ CREATININE 0.95 0.64 0.49 0.54 0.63  CALCIUM 8.1* 8.2* 8.4* 8.2* 8.8*  MG 1.7 1.7 1.6* 1.6* 1.8   Liver Function Tests: Recent Labs  Lab 05/15/22 2047 05/16/22 0730 05/17/22 0343 05/20/22 0549  AST 81* 83* 33 21  ALT 40 57* 36 29  ALKPHOS 86 100 87 77  BILITOT 1.7* 1.8* 0.9 0.8  PROT 6.6 7.0 6.5 5.9*  ALBUMIN 3.4* 3.4* 3.2* 3.0*   CBG: Recent Labs  Lab 05/20/22 0722 05/20/22 1143 05/20/22 1626 05/20/22 2006 05/21/22 0748  GLUCAP 236* 276* 174* 213* 158*    Discharge time spent: greater than 30 minutes.  Signed: Deatra James, MD [facility] 05/21/2022

## 2022-05-22 DIAGNOSIS — R6510 Systemic inflammatory response syndrome (SIRS) of non-infectious origin without acute organ dysfunction: Secondary | ICD-10-CM | POA: Diagnosis not present

## 2022-05-22 LAB — BASIC METABOLIC PANEL
Anion gap: 6 (ref 5–15)
BUN: 17 mg/dL (ref 8–23)
CO2: 27 mmol/L (ref 22–32)
Calcium: 8.6 mg/dL — ABNORMAL LOW (ref 8.9–10.3)
Chloride: 103 mmol/L (ref 98–111)
Creatinine, Ser: 0.55 mg/dL (ref 0.44–1.00)
GFR, Estimated: >60 mL/min (ref >60)
Glucose, Bld: 194 mg/dL — ABNORMAL HIGH (ref 70–99)
Potassium: 3.4 mmol/L — ABNORMAL LOW (ref 3.5–5.1)
Sodium: 136 mmol/L (ref 135–145)

## 2022-05-22 LAB — GLUCOSE, CAPILLARY
Glucose-Capillary: 165 mg/dL — ABNORMAL HIGH (ref 70–99)
Glucose-Capillary: 213 mg/dL — ABNORMAL HIGH (ref 70–99)
Glucose-Capillary: 143 mg/dL — ABNORMAL HIGH (ref 70–99)

## 2022-05-22 MED ORDER — POTASSIUM CHLORIDE CRYS ER 20 MEQ PO TBCR
40.0000 meq | EXTENDED_RELEASE_TABLET | Freq: Once | ORAL | Status: AC
  Administered 2022-05-22: 40 meq via ORAL
  Filled 2022-05-22: qty 2

## 2022-05-22 NOTE — Plan of Care (Signed)
  Problem: Pain Managment: Goal: General experience of comfort will improve Outcome: Progressing   Problem: Safety: Goal: Ability to remain free from injury will improve Outcome: Progressing   Problem: Skin Integrity: Goal: Risk for impaired skin integrity will decrease Outcome: Progressing   

## 2022-05-22 NOTE — Progress Notes (Signed)
AVS and social worker's paperwork placed in discharge packet. Report called and given to Asa Lente, LPN at [facility]. All questions answered to satisfaction. Pt escorted off unit via wheelchair by this RN with all belongings to [facility] room 125.

## 2022-05-22 NOTE — TOC Transition Note (Signed)
Transition of Care [facility]) - CM/SW Discharge Note   Patient Details  Name: Jeanne Kim MRN: [id number] Date of Birth: [birth year]  Transition of Care [facility]) CM/SW Contact:  Shade Flood, LCSW Phone Number: 05/22/2022, 1:52 PM   Clinical Narrative:     Pt remains medically stable for dc today. Peer to peer was completed and insurance has authorized SNF. Kerri at [facility] states they can accept pt today.  Pt updated. DC clinical sent electronically. RN to call report.  There are no other TOC needs for dc.  Final next level of care: Skilled Nursing Facility Barriers to Discharge: Barriers Resolved   Patient Goals and CMS Choice Patient states their goals for this hospitalization and ongoing recovery are:: return home CMS Medicare.gov Compare Post Acute Care list provided to:: Patient Choice offered to / list presented to : Patient  Discharge Placement              Patient chooses bed at: [facility] Patient to be transferred to facility by: w/c Name of family member notified: Josph Macho Patient and family notified of of transfer: 05/22/22  Discharge Plan and Services In-house Referral: Clinical Social Work Discharge Planning Services: CM Consult Post Acute Care Choice: Home Health                               Social Determinants of Health (SDOH) Interventions     Readmission Risk Interventions    05/16/2022   11:25 AM  Readmission Risk Prevention Plan  Transportation Screening Complete  Medication Review Press photographer) Complete  HRI or [place] Complete  SW Recovery Care/Counseling Consult Complete  [place] Not Applicable

## 2022-05-22 NOTE — Discharge Summary (Signed)
Physician Discharge Summary   Patient: Jeanne Kim MRN: [id number] DOB: September 17, 1941  Admit date:     05/15/2022  Discharge date: 05/22/22  Discharge Physician: Jeanne Kim   PCP: Jeanne Burly, MD   Recommendations at discharge:  Follow-up with a PCP in 1-2 weeks Continue current medication, close monitoring of BP   Patient was pending insurance approval...  The patient was seen and examined, cleared for discharge to SNF     Review one-to-one was completed with patient's insurance [employer]-patient got approved for at least  rehab  Discharge Diagnoses: Principal Problem:   SIRS (systemic inflammatory response syndrome) ([place]) Active Problems:   Acute metabolic encephalopathy   AKI (acute kidney injury) ([place])   Hypothyroidism   Essential hypertension   CAD (coronary artery disease)   Uncontrolled type 2 diabetes mellitus with hyperglycemia, with long-term current use of insulin (HCC)   Leukemoid reaction   Hypokalemia   Hypomagnesemia  Resolved Problems:   * No resolved hospital problems. *  Hospital Course: CIIN [name] 81 year old female with a history of diabetes mellitus, hypertension, hyperlipidemia, hypothyroidism, coronary artery disease presenting with approximately 1 week history of generalized weakness and decreased oral intake and decreased urine output.  Unfortunately, the patient is a poor historian secondary to some cognitive impairment and acute encephalopathy.  Nevertheless, the patient denies any chest pain, shortness breath, abdominal pain, nausea, vomiting, diarrhea, abdominal pain. In the ED, the patient was noted to be hypothermic with a temperature of 96.6 F.  She was hypotensive with blood pressure 83/36.  She was started on Levophed.  WBC 21.7, hemoglobin 10.0, platelets 429,000.  Sodium 128, potassium 4.3, bicarbonate 24, serum creatinine 1.68.  TSH 9.865.  CT of the chest abdomen pelvis showed some interlobular septal thickening.  There is no  cardiomegaly and prominent central pulmonary veins.  There is diffuse bronchial wall thickening.  There was no consolidation.  There is periduodenal stranding without any free air or ascites.  UA was negative for pyuria.  The patient was started vancomycin and cefepime and IV fluids.  She was admitted for further evaluation and treatment.  She was initially started on Levophed. She was ultimately weaned off levophed with IVF resuscitation.  After all cultures remained neg and pt remained stable, vanc and cefepime were discontinued and patient remains clinically stable.  Current barrier for d/c is waiting for insurance authorization.  Assessment and Plan: * SIRS (systemic inflammatory response syndrome) (HCC) Presented with hypotension, tachycardia, and leukocytosis Sepsis ruled out Personally reviewed chest x-ray--no consolidation UA without pyuria Follow blood cultures--neg to date Discontinue empiric vancomycin and cefepime>>remains stable Initially on IV fluids>>saline locked Lactic acid peaked 2.2 Check PCT <0.10 x 2 BP remains soft>>continue midodrine>>decrease/wean dose  Acute metabolic encephalopathy Initially confused and agitated Secondary to sepsis and acute kidney injury Has underlying cognitive impairment --overall improved, at baseline now   AKI (acute kidney injury) ([place]) Baseline creatinine 0.7-1.0 Presented with serum creatinine 1.68>>0.64 Secondary to volume depletion and hypotension Ramipril and metolazone>> were on HOLD \ BP remains soft/controlled Resume urosemide Improved with IVF>>now saline D/Ced   Hypomagnesemia repleted  Hypokalemia replete  Leukemoid reaction Add diff to CBC>>no concerning WBC precursors Pt received dexamathasone 05/15/22 at 2353   Uncontrolled type 2 diabetes mellitus with hyperglycemia, with long-term current use of insulin (HCC) NovoLog sliding scale 05/17/22  hemoglobin A1c--10.4 Discontinued metformin Resuming home  regimen  CAD (coronary artery disease) No chest pain presently Personally reviewed EKG--junctional rhythm, no ST-T wave changes  Essential hypertension D/ced Ramipril amlodipine and metoprolol succinate secondary to hypotension Family states that the patient has not been taking her medications properly anyway BP remains soft but stable with midodrine Echo EF 60-65%, no WMA, G1DD, mild MR  Hypothyroidism There has been concern that the patient has not been taking her medications at home TSH 9.865 Restarted levothyroxine     Consultants: None  Procedures performed: none   Disposition: Nursing home Diet recommendation:  Discharge Diet Orders (From admission, onward)     Start     Ordered   05/21/22 0000  Diet - low sodium heart healthy        05/21/22 0939           Cardiac and Carb modified diet DISCHARGE MEDICATION: Allergies as of 05/22/2022       Reactions   Codeine Hives   Sulfonamide Derivatives Hives   Canagliflozin    Penicillins Hives   Pt states she does NOT have allergy to penicillin   Sulfa Antibiotics Other (See Comments)   unknown        Medication List     STOP taking these medications    amLODipine 5 MG tablet Commonly known as: NORVASC   metFORMIN 1000 MG tablet Commonly known as: GLUCOPHAGE   metoprolol succinate 50 MG 24 hr tablet Commonly known as: TOPROL-XL   pantoprazole 40 MG tablet Commonly known as: PROTONIX       TAKE these medications    Aspirin Low Dose 81 MG tablet Generic drug: aspirin EC Take 81 mg by mouth every morning.   DULoxetine 60 MG capsule Commonly known as: CYMBALTA Take 60 mg by mouth daily.   EPINEPHrine 0.3 mg/0.3 mL Soaj injection Commonly known as: EPI-PEN Inject 0.3 mg into the muscle daily as needed for anaphylaxis.   furosemide 20 MG tablet Commonly known as: LASIX Take 20 mg by mouth daily.   Insulin Glargine-Lixisenatide 100-33 UNT-MCG/ML Sopn Inject 20-25 Units into the skin  daily. Pt adjust between 20-25 units   Soliqua 100-33 UNT-MCG/ML Sopn Generic drug: Insulin Glargine-Lixisenatide Inject 0-20 Units into the skin daily. Sliding Scale   Jardiance 25 MG Tabs tablet Generic drug: empagliflozin Take 25 mg by mouth daily.   levothyroxine 150 MCG tablet Commonly known as: SYNTHROID Take 1 tablet (150 mcg total) by mouth daily at 6 (six) AM.   linaclotide 290 MCG Caps capsule Commonly known as: LINZESS Take 1 capsule (290 mcg total) by mouth daily before breakfast. What changed:  when to take this additional instructions   Melatonin 10 MG Tabs Take 10 mg by mouth at bedtime as needed (sleep).   midodrine 2.5 MG tablet Commonly known as: PROAMATINE Take 1 tablet (2.5 mg total) by mouth 3 (three) times daily with meals. What changed:  medication strength how much to take when to take this        Contact information for after-discharge care     [facility] .   Service: Skilled Nursing Contact information: [address] [place] [phone]                    Discharge Exam: Danley Danker Weights   05/20/22 0500 05/21/22 0500 05/22/22 0981  Weight: 72.7 kg 71.9 kg 72.3 kg      Physical Exam:   General:  AAO x 3,  cooperative, no distress;   HEENT:  Normocephalic, PERRL, otherwise with in Normal limits  Neuro:  CNII-XII intact. , normal motor and sensation, reflexes intact   Lungs:   Clear to auscultation BL, Respirations unlabored,  No wheezes / crackles  Cardio:    S1/S2, RRR, No murmure, No Rubs or Gallops   Abdomen:  Soft, non-tender, bowel sounds active all four quadrants, no guarding or peritoneal signs.  Muscular  skeletal:  Limited exam -global generalized weaknesses - in bed, able to move all 4 extremities,   2+ pulses,  symmetric, No pitting edema  Skin:  Dry, warm to touch, negative for any Rashes,  Wounds: Please see nursing  documentation             Condition at discharge: good  The results of significant diagnostics from this hospitalization (including imaging, microbiology, ancillary and laboratory) are listed below for reference.   Imaging Studies: ECHOCARDIOGRAM COMPLETE  Result Date: 05/18/2022    ECHOCARDIOGRAM REPORT   Patient Name:   SWEET JARVIS Date of Exam: 05/18/2022 Medical Rec #:  [id number]      Height:       66.0 in Accession #:    [id number]     Weight:       164.0 lb Date of Birth:  22-Aug-1941      BSA:          1.838 m Patient Age:    81 years       BP:           112/64 mmHg Patient Gender: F              HR:           83 bpm. Exam Location:  [facility] Procedure: 2D Echo, Cardiac Doppler and Color Doppler Indications:    Dyspnea  History:        Patient has prior history of Echocardiogram examinations, most                 recent 09/28/2020. CAD, Signs/Symptoms:Chest Pain, Syncope,                 Dyspnea and Dizziness/Lightheadedness; Risk                 Factors:Hypertension, Diabetes and Dyslipidemia. Breast CA.  Sonographer:    Wenda Low Referring Phys: [phone] DAVID TAT IMPRESSIONS  1. Left ventricular ejection fraction, by estimation, is 60 to 65%. The left ventricle has normal function. The left ventricle has no regional wall motion abnormalities. There is mild left ventricular hypertrophy. Left ventricular diastolic parameters are consistent with Grade I diastolic dysfunction (impaired relaxation).  2. Right ventricular systolic function is normal. The right ventricular size is normal. Tricuspid regurgitation signal is inadequate for assessing PA pressure.  3. Left atrial size was moderately dilated.  4. The mitral valve is abnormal. Mild mitral valve regurgitation. No evidence of mitral stenosis.  5. The tricuspid valve is abnormal.  6. The aortic valve is tricuspid. There is mild calcification of the aortic valve. There is mild thickening of the aortic valve. Aortic valve regurgitation  is mild.  7. Aortic dilatation noted. There is mild dilatation of the ascending aorta, measuring 37 mm.  8. The inferior vena cava is normal in size with greater than 50% respiratory variability, suggesting right atrial pressure of 3 mmHg. FINDINGS  Left Ventricle: Left ventricular ejection fraction, by estimation, is 60 to 65%. The left ventricle has normal function. The left ventricle has no regional wall motion abnormalities. The left ventricular internal cavity size was normal in  size. There is  mild left ventricular hypertrophy. Left ventricular diastolic parameters are consistent with Grade I diastolic dysfunction (impaired relaxation). Normal left ventricular filling pressure. Right Ventricle: The right ventricular size is normal. Right vetricular wall thickness was not well visualized. Right ventricular systolic function is normal. Tricuspid regurgitation signal is inadequate for assessing PA pressure. Left Atrium: Left atrial size was moderately dilated. Right Atrium: Right atrial size was normal in size. Pericardium: There is no evidence of pericardial effusion. Mitral Valve: The mitral valve is abnormal. There is mild thickening of the mitral valve leaflet(s). There is mild calcification of the mitral valve leaflet(s). Mild mitral annular calcification. Mild mitral valve regurgitation. No evidence of mitral valve stenosis. MV peak gradient, 5.1 mmHg. The mean mitral valve gradient is 2.0 mmHg. Tricuspid Valve: The tricuspid valve is abnormal. Tricuspid valve regurgitation is mild . No evidence of tricuspid stenosis. Aortic Valve: The aortic valve is tricuspid. There is mild calcification of the aortic valve. There is mild thickening of the aortic valve. There is mild aortic valve annular calcification. Aortic valve regurgitation is mild. Aortic valve mean gradient measures 4.0 mmHg. Aortic valve peak gradient measures 8.1 mmHg. Aortic valve area, by VTI measures 2.16 cm. Pulmonic Valve: The pulmonic  valve was not well visualized. Pulmonic valve regurgitation is not visualized. No evidence of pulmonic stenosis. Aorta: The aortic root is normal in size and structure and aortic dilatation noted. There is mild dilatation of the ascending aorta, measuring 37 mm. Venous: The inferior vena cava is normal in size with greater than 50% respiratory variability, suggesting right atrial pressure of 3 mmHg. IAS/Shunts: No atrial level shunt detected by color flow Doppler.  LEFT VENTRICLE PLAX 2D LVIDd:         4.80 cm     Diastology LVIDs:         2.90 cm     LV e' medial:    5.77 cm/s LV PW:         1.10 cm     LV E/e' medial:  16.3 LV IVS:        1.20 cm     LV e' lateral:   9.57 cm/s LVOT diam:     1.90 cm     LV E/e' lateral: 9.8 LV SV:         68 LV SV Index:   37 LVOT Area:     2.84 cm  LV Volumes (MOD) LV vol d, MOD A2C: 39.6 ml LV vol d, MOD A4C: 33.2 ml LV vol s, MOD A2C: 16.1 ml LV vol s, MOD A4C: 14.5 ml LV SV MOD A2C:     23.5 ml LV SV MOD A4C:     33.2 ml LV SV MOD BP:      20.6 ml RIGHT VENTRICLE RV Basal diam:  3.30 cm RV Mid diam:    3.10 cm RV S prime:     17.60 cm/s TAPSE (M-mode): 2.2 cm LEFT ATRIUM             Index        RIGHT ATRIUM           Index LA diam:        3.60 cm 1.96 cm/m   RA Area:     13.70 cm LA Vol (A2C):   91.3 ml 49.67 ml/m  RA Volume:   33.70 ml  18.33 ml/m LA Vol (A4C):   70.5 ml 38.35 ml/m LA Biplane Vol: 82.0 ml 44.61 ml/m  AORTIC VALVE                    PULMONIC VALVE AV Area (Vmax):    2.18 cm     PV Vmax:       0.78 m/s AV Area (Vmean):   2.04 cm     PV Peak grad:  2.4 mmHg AV Area (VTI):     2.16 cm AV Vmax:           142.00 cm/s AV Vmean:          91.050 cm/s AV VTI:            0.315 m AV Peak Grad:      8.1 mmHg AV Mean Grad:      4.0 mmHg LVOT Vmax:         109.00 cm/s LVOT Vmean:        65.400 cm/s LVOT VTI:          0.240 m LVOT/AV VTI ratio: 0.76  AORTA Ao Root diam: 3.20 cm Ao Asc diam:  3.70 cm MITRAL VALVE MV Area (PHT): 3.34 cm     SHUNTS MV Area VTI:    1.90 cm     Systemic VTI:  0.24 m MV Peak grad:  5.1 mmHg     Systemic Diam: 1.90 cm MV Mean grad:  2.0 mmHg MV Vmax:       1.13 m/s MV Vmean:      58.3 cm/s MV Decel Time: 227 msec MV E velocity: 93.80 cm/s MV A velocity: 104.00 cm/s MV E/A ratio:  0.90 Carlyle Dolly MD Electronically signed by Carlyle Dolly MD Signature Date/Time: 05/18/2022/2:39:05 PM    Final    CT CHEST ABDOMEN PELVIS WO CONTRAST  Result Date: 05/16/2022 CLINICAL DATA:  Sepsis, weakness, hypotensive and bradycardic. Limited p.o. intake past few days. EXAM: CT CHEST, ABDOMEN AND PELVIS WITHOUT CONTRAST TECHNIQUE: Multidetector CT imaging of the chest, abdomen and pelvis was performed following the standard protocol without IV contrast. RADIATION DOSE REDUCTION: This exam was performed according to the departmental dose-optimization program which includes automated exposure control, adjustment of the mA and/or kV according to patient size and/or use of iterative reconstruction technique. COMPARISON:  Portable chest yesterday and 05/10/2022, thoracic spine CT myelogram images 11/19/17, and abdomen and pelvis CT without and with contrast 08/05/2020. FINDINGS: CT CHEST FINDINGS Cardiovascular: The heart is slightly enlarged. There is no pericardial effusion. There is patchy three-vessel calcific CAD. The pulmonary trunk is upper limits of normal in caliber with minimal air in the anterior pulmonary trunk likely iatrogenic. There are calcific plaques in the arch and descending aorta, scattered calcifications in the great vessels. There is no aortic aneurysm. There are prominent central pulmonary veins. Mediastinum/Nodes: There are slightly prominent lymph nodes in the subcarinal and low right paratracheal spaces, both measure up to 1.2 cm in short axis. No other intrathoracic adenopathy is seen without contrast. Axillary spaces are clear. The trachea, lower poles of the thyroid are unremarkable. There is a moderate-sized hiatal hernia and mild  chronic esophageal thickening unchanged since 2019. Lungs/Pleura: There are bilateral minimal layering pleural effusions. Interlobular septal thickening is seen in the lung apices and bases consistent with interstitial edema. There is diffuse bronchial thickening, scattered linear scar-like opacities in the lung bases no pulmonary mass or confluent consolidation is seen. Bronchi are clear. Musculoskeletal: There is partially visible lower cervical anterior fusion plating. There is osteopenia, thoracic kyphosis and multilevel advanced degenerative disc disease with slight  dextroscoliosis. No acute or significant osseous findings. Surgical clips and dystrophic calcifications are noted chronically in the lower medial left breast. No other significant chest wall findings. CT ABDOMEN PELVIS FINDINGS Hepatobiliary: No focal abnormality is seen without contrast. Old cholecystectomy. No biliary dilatation. Pancreas: Diffuse chronic atrophy is seen. No other focal abnormality. Spleen: No focal abnormality is seen without contrast. No splenomegaly. Adrenals/Urinary Tract: There is no adrenal mass. No focal abnormality of unenhanced renal cortex. Bilateral perinephric stranding appears similar. There is no urinary stone or obstruction. The left side of bladder is obscured by metal artifact from left hip replacement. The remainder of the bladder is unremarkable. Stomach/Bowel: There is inflammatory stranding along side the descending and horizontal segments of the duodenum, with thickened folds. Remainder of the small bowel is unremarkable. The appendix is normal caliber. There is moderate stool retention. There are colonic diverticula without evidence of diverticulitis. Vascular/Lymphatic: Aortic atherosclerosis. No enlarged abdominal or pelvic lymph nodes. Reproductive: Status post hysterectomy. No adnexal masses. Other: There is minimal ascites in the abdomen in the perihepatic spaces, posterior to the descending duodenum,  and trace amount in the pelvis. There is mild body wall anasarca. There is no free air, hemorrhage or abscess. Musculoskeletal: Left hip replacement. Mild lumbar levoscoliosis. Osteopenia and degenerative change lumbar spine most advanced at L4-5 and L5-S1. No concerning regional bone lesion. IMPRESSION: 1. Mild cardiomegaly with prominent central pulmonary veins, interstitial edema in the lung bases and apices and minimal pleural effusions. Findings consistent with mild CHF or fluid overload. 2. Diffuse bronchial thickening which could indicate bronchitis or congestive bronchial thickening. No evidence of bronchopneumonia. 3. A few slightly prominent mediastinal nodes. No bulky or encasing adenopathy. 4. Aortic and coronary artery atherosclerosis and upper limit of normal pulmonary trunk caliber. 5. Minimal air in the pulmonary trunk likely iatrogenic. 6. Moderate-sized hiatal hernia. 7. Paraduodenal stranding most likely due to duodenitis or inflamed ulcerative disease. Not likely related to the diffusely atrophic pancreas but clinical correlation advised. No free air or abscess. 8. Constipation with diverticulosis. 9. Minimal ascites.  Mild body wall edema. Electronically Signed   By: Telford Nab M.D.   On: 05/16/2022 01:38   DG Chest Port 1 View  Result Date: 05/15/2022 CLINICAL DATA:  Weakness, hypotension, bradycardia EXAM: PORTABLE CHEST 1 VIEW COMPARISON:  05/10/2022 FINDINGS: Lungs volumes are small, but are symmetric and are clear. No pneumothorax or pleural effusion. Cardiac size within normal limits. Pulmonary vascularity is normal. Osseous structures are age-appropriate. No acute bone abnormality. IMPRESSION: No active disease. Electronically Signed   By: Fidela Salisbury M.D.   On: 05/15/2022 21:19   DG Chest Port 1 View  Result Date: 05/10/2022 CLINICAL DATA:  Weakness and shortness of breath EXAM: PORTABLE CHEST 1 VIEW COMPARISON:  05/02/2021 FINDINGS: Shallow inspiration. Heart size and  pulmonary vascularity are normal for technique. Lungs are clear. No pleural effusions. No pneumothorax. Mediastinal contours appear intact. Probable esophageal hiatal hernia behind the heart. Postoperative changes in the cervical spine. Degenerative changes in the left upper quadrant. Degenerative changes in the shoulders and spine. IMPRESSION: Shallow inspiration.  No evidence of active pulmonary disease. Electronically Signed   By: Lucienne Capers M.D.   On: 05/10/2022 23:38   CT HEAD WO CONTRAST (5MM)  Result Date: 05/10/2022 CLINICAL DATA:  Mental status change of unknown cause. Weakness over 1 year, worse in the last few weeks. Unwitnessed fall yesterday. Unsteadiness, dizziness, and confusion. EXAM: CT HEAD WITHOUT CONTRAST TECHNIQUE: Contiguous axial images were obtained from  the base of the skull through the vertex without intravenous contrast. RADIATION DOSE REDUCTION: This exam was performed according to the departmental dose-optimization program which includes automated exposure control, adjustment of the mA and/or kV according to patient size and/or use of iterative reconstruction technique. COMPARISON:  09/29/2020 FINDINGS: Brain: Diffuse cerebral atrophy. Ventricular dilatation could be due to central atrophy or normal pressure hydrocephalus. No change since prior study. Low-attenuation changes in the deep white matter consistent with small vessel ischemia. No abnormal extra-axial fluid collections. No mass effect or midline shift. Gray-white matter junctions are distinct. Basal cisterns are not effaced. No acute intracranial hemorrhage. Vascular: No hyperdense vessel or unexpected calcification. Skull: Normal. Negative for fracture or focal lesion. Sinuses/Orbits: Paranasal sinuses and mastoid air cells are clear. Other: None. IMPRESSION: No acute intracranial abnormalities. Chronic atrophy and small vessel ischemic changes. Ventricular dilatation, likely central atrophy but could indicate normal  pressure hydrocephalus in the appropriate clinical setting. No change since prior study. Electronically Signed   By: Lucienne Capers M.D.   On: 05/10/2022 23:28    Microbiology: Results for orders placed or performed during the hospital encounter of 05/15/22  Culture, blood (routine x 2)     Status: None   Collection Time: 05/15/22  8:52 PM   Specimen: BLOOD RIGHT FOREARM  Result Value Ref Range Status   Specimen Description BLOOD RIGHT FOREARM  Final   Special Requests   Final    BOTTLES DRAWN AEROBIC AND ANAEROBIC Blood Culture adequate volume   Culture   Final    NO GROWTH 5 DAYS Performed at [facility], [address], [place], [place] [address]    Report Status 05/20/2022 FINAL  Final  Urine Culture     Status: None   Collection Time: 05/15/22 11:20 PM   Specimen: In/Out Cath Urine  Result Value Ref Range Status   Specimen Description   Final    IN/OUT CATH URINE Performed at [facility], [address], [place], [place] [address]    Special Requests   Final    NONE Performed at [facility], [address], [place], [place] [address]    Culture   Final    NO GROWTH Performed at [facility], [place] [address] [place], [place] [address]    Report Status 05/17/2022 FINAL  Final  Resp Panel by RT-PCR (Flu A&B, Covid) Anterior Nasal Swab     Status: None   Collection Time: 05/16/22  3:17 AM   Specimen: Anterior Nasal Swab  Result Value Ref Range Status   SARS Coronavirus 2 by RT PCR NEGATIVE NEGATIVE Final    Comment: (NOTE) SARS-CoV-2 target nucleic acids are NOT DETECTED.  The SARS-CoV-2 RNA is generally detectable in upper respiratory specimens during the acute phase of infection. The lowest concentration of SARS-CoV-2 viral copies this assay can detect is 138 copies/mL. A negative result does not preclude SARS-Cov-2 infection and should not be used as the sole basis for treatment or other patient management decisions. A negative result may occur with   improper specimen collection/handling, submission of specimen other than nasopharyngeal swab, presence of viral mutation(s) within the areas targeted by this assay, and inadequate number of viral copies(<138 copies/mL). A negative result must be combined with clinical observations, patient history, and epidemiological information. The expected result is Negative.  Fact Sheet for Patients:  EntrepreneurPulse.com.au  Fact Sheet for Healthcare Providers:  IncredibleEmployment.be  This test is no t yet approved or cleared by the [place] and  has been authorized for detection and/or diagnosis of SARS-CoV-2 by FDA under an Emergency Use Authorization (EUA). This EUA will remain  in effect (meaning this test can be used) for the duration of the COVID-19 declaration under Section 564(b)(1) of the Act, 21 U.S.C.section 360bbb-3(b)(1), unless the authorization is terminated  or revoked sooner.       Influenza A by PCR NEGATIVE NEGATIVE Final   Influenza B by PCR NEGATIVE NEGATIVE Final    Comment: (NOTE) The Xpert Xpress SARS-CoV-2/FLU/RSV plus assay is intended as an aid in the diagnosis of influenza from Nasopharyngeal swab specimens and should not be used as a sole basis for treatment. Nasal washings and aspirates are unacceptable for Xpert Xpress SARS-CoV-2/FLU/RSV testing.  Fact Sheet for Patients: EntrepreneurPulse.com.au  Fact Sheet for Healthcare Providers: IncredibleEmployment.be  This test is not yet approved or cleared by the [place] FDA and has been authorized for detection and/or diagnosis of SARS-CoV-2 by FDA under an Emergency Use Authorization (EUA). This EUA will remain in effect (meaning this test can be used) for the duration of the COVID-19 declaration under Section 564(b)(1) of the Act, 21 U.S.C. section 360bbb-3(b)(1), unless the authorization is terminated  or revoked.  Performed at [facility], [address], [place], [place] [address]   MRSA Next Gen by PCR, Nasal     Status: None   Collection Time: 05/16/22 11:02 AM   Specimen: Nasal Mucosa; Nasal Swab  Result Value Ref Range Status   MRSA by PCR Next Gen NOT DETECTED NOT DETECTED Final    Comment: (NOTE) The GeneXpert MRSA Assay (FDA approved for NASAL specimens only), is one component of a comprehensive MRSA colonization surveillance program. It is not intended to diagnose MRSA infection nor to guide or monitor treatment for MRSA infections. Test performance is not FDA approved in patients less than 77 years old. Performed at [facility], [address], [place], [place] [address]     Labs: CBC: Recent Labs  Lab 05/15/22 2047 05/16/22 0730 05/17/22 0343 05/18/22 0603 05/19/22 3154 05/20/22 0549 05/21/22 0557  WBC 21.7*   < > 29.6* 18.3* 13.4* 12.4* 16.4*  NEUTROABS 15.9*  --   --  12.0* 7.1  --   --   HGB 10.0*   < > 10.0* 10.3* 11.4* 10.4* 11.3*  HCT 32.7*   < > 31.3* 33.9* 37.0 34.2* 37.3  MCV 80.7   < > 78.4* 80.5 80.6 80.7 81.1  PLT 429*   < > 458* 439* 456* 471* 500*   < > = values in this interval not displayed.   Basic Metabolic Panel: Recent Labs  Lab 05/17/22 0343 05/18/22 0603 05/19/22 0613 05/20/22 0549 05/21/22 0557 05/22/22 0501  NA 131* 137 135 133* 135 136  K 3.1* 3.0* 3.3* 3.9 3.7 3.4*  CL 97* 101 101 101 100 103  CO2 '24 28 28 25 27 27  '$ GLUCOSE 273* 115* 119* 219* 145* 194*  BUN 24* '17 13 22 20 17  '$ CREATININE 0.95 0.64 0.49 0.54 0.63 0.55  CALCIUM 8.1* 8.2* 8.4* 8.2* 8.8* 8.6*  MG 1.7 1.7 1.6* 1.6* 1.8  --    Liver Function Tests: Recent Labs  Lab 05/15/22 2047 05/16/22 0730 05/17/22 0343 05/20/22 0549  AST 81* 83* 33 21  ALT 40 57* 36 29  ALKPHOS 86 100 87 77  BILITOT 1.7* 1.8* 0.9 0.8  PROT 6.6 7.0 6.5 5.9*  ALBUMIN 3.4* 3.4* 3.2* 3.0*   CBG: Recent Labs  Lab 05/21/22 0748 05/21/22  1159 05/21/22 1706 05/21/22 2128  05/22/22 0731  GLUCAP 158* 168* 130* 188* 165*    Discharge time spent: greater than 30 minutes.  Signed: Deatra James, MD [facility] 05/22/2022

## 2022-05-23 ENCOUNTER — Non-Acute Institutional Stay (SKILLED_NURSING_FACILITY): Payer: Medicare HMO | Admitting: Adult Health

## 2022-05-23 ENCOUNTER — Encounter: Payer: Self-pay | Admitting: Adult Health

## 2022-05-23 DIAGNOSIS — G9341 Metabolic encephalopathy: Secondary | ICD-10-CM

## 2022-05-23 DIAGNOSIS — M797 Fibromyalgia: Secondary | ICD-10-CM

## 2022-05-23 DIAGNOSIS — E1159 Type 2 diabetes mellitus with other circulatory complications: Secondary | ICD-10-CM

## 2022-05-23 DIAGNOSIS — E1165 Type 2 diabetes mellitus with hyperglycemia: Secondary | ICD-10-CM | POA: Diagnosis not present

## 2022-05-23 DIAGNOSIS — E46 Unspecified protein-calorie malnutrition: Secondary | ICD-10-CM

## 2022-05-23 DIAGNOSIS — N179 Acute kidney failure, unspecified: Secondary | ICD-10-CM

## 2022-05-23 DIAGNOSIS — E039 Hypothyroidism, unspecified: Secondary | ICD-10-CM

## 2022-05-23 DIAGNOSIS — E8809 Other disorders of plasma-protein metabolism, not elsewhere classified: Secondary | ICD-10-CM

## 2022-05-23 DIAGNOSIS — R600 Localized edema: Secondary | ICD-10-CM

## 2022-05-23 DIAGNOSIS — I9589 Other hypotension: Secondary | ICD-10-CM

## 2022-05-23 DIAGNOSIS — R6510 Systemic inflammatory response syndrome (SIRS) of non-infectious origin without acute organ dysfunction: Secondary | ICD-10-CM | POA: Diagnosis not present

## 2022-05-23 DIAGNOSIS — R4189 Other symptoms and signs involving cognitive functions and awareness: Secondary | ICD-10-CM

## 2022-05-23 DIAGNOSIS — Z794 Long term (current) use of insulin: Secondary | ICD-10-CM

## 2022-05-23 DIAGNOSIS — K5909 Other constipation: Secondary | ICD-10-CM

## 2022-05-23 DIAGNOSIS — I152 Hypertension secondary to endocrine disorders: Secondary | ICD-10-CM

## 2022-05-23 DIAGNOSIS — I700 Atherosclerosis of aorta: Secondary | ICD-10-CM

## 2022-05-23 DIAGNOSIS — I959 Hypotension, unspecified: Secondary | ICD-10-CM | POA: Insufficient documentation

## 2022-05-23 NOTE — Progress Notes (Signed)
Location:  [place] Room Number: NO/125/P Place of Service:  SNF (31) Shanti Agresti S.,NP CODE STATUS: FULL  Allergies  Allergen Reactions   Codeine Hives   Sulfonamide Derivatives Hives   Canagliflozin    Penicillins Hives    Pt states she does NOT have allergy to penicillin   Sulfa Antibiotics Other (See Comments)    unknown    Chief Complaint  Patient presents with   Hospitalization Follow-up    Patient is here for follow up after hospital stay     HPI:  She is a 81 year old woman who has been hospitalized from 05-15-22 through 05-22-22. Her medical history includes: diabetes hypothyroidism; cad hypertension. She presented to the ED with approximately 1 week history of generalized weakness; poor oral intake and decreased urine output. She does have some cognitive impairment and is a poor historian she had acute encephalopathy. Her wbc was 21.7 with a low blood pressure 83/36 was started on levophed. Her ct demonstrated interlobular septal thickening diffuse; and diffuse bronchial wall thickening.    SIRS: she was placed on levophed and was weaned off; she is midodrine with meals. Sepsis was ruled out. UA was negative; blood cultures negative she was started on empiric vancomycin and cefepime which was stopped.  Acute metabolic encephalopathy: she was initially confused and agitated; has underlying cognitive impairment.  She is here for short term rehab with her goal to return back home. She denies any pain; had poor sleep last night. She will continue to be followed for her chronic illnesses including: Uncontrolled type 2 diabetes mellitus with hyperglycemia with current long term use of insulin:  Hypothyroidism unspecified type:  Chronic constipation:  Aortic atherosclerosis     Past Medical History:  Diagnosis Date   Allergy    Anemia    Anginal pain (HCC)    Anxiety    Arthritis    Blood transfusion without reported diagnosis    Breast cancer ([place])     Carotid artery plaque    bilat   Cataract    Colon polyps    adenomatous   Concussion 02/2016   Driving restrictions for 6w   Coronary heart disease    Depression    Diverticulosis    Dizziness    Esophageal reflux    Esophageal stricture    Fibromyalgia    Gallstones    Hiatal hernia    Hypercholesterolemia    Hypertension    Hypothyroidism    IBS (irritable bowel syndrome)    Kidney stones    Osteoporosis    Pneumonia    recently, just finished ABO   Pneumonia    PONV (postoperative nausea and vomiting)    Sleep apnea    CPAP   Thyroid disease 2002   Type II or unspecified type diabetes mellitus without mention of complication, not stated as uncontrolled    Unspecified gastritis and gastroduodenitis without mention of hemorrhage     Past Surgical History:  Procedure Laterality Date   ANAL RECTAL MANOMETRY N/A 08/05/2015   Procedure: ANO RECTAL MANOMETRY;  Surgeon: Mauri Pole, MD;  Location: [facility];  Service: Endoscopy;  Laterality: N/A;   APPENDECTOMY     BIOPSY  08/06/2020   Procedure: BIOPSY;  Surgeon: Eloise Harman, DO;  Location[facility];  Service: Endoscopy;;   BIOPSY  12/21/2020   Procedure: BIOPSY;  Surgeon: Thornton Park, MD;  Location: [facility];  Service: Gastroenterology;;   BIOPSY  12/23/2020   Procedure:  BIOPSY;  Surgeon: Thornton Park, MD;  Location: [facility];  Service: Gastroenterology;;   BREAST LUMPECTOMY     left   CARDIAC CATHETERIZATION     CATARACT EXTRACTION W/PHACO Left 03/30/2013   Procedure: CATARACT EXTRACTION PHACO AND INTRAOCULAR LENS PLACEMENT ([place]);  Surgeon: Tonny Branch, MD;  Location: [facility];  Service: Ophthalmology;  Laterality: Left;  CDE:19.28   CATARACT EXTRACTION W/PHACO Right 04/09/2013   Procedure: CATARACT EXTRACTION PHACO AND INTRAOCULAR LENS PLACEMENT ([facility]);  Surgeon: Tonny Branch, MD;  Location: [facility];  Service: Ophthalmology;  Laterality: Right;  CDE: 15.44   CERVICAL FUSION      CESAREAN SECTION     CHOLECYSTECTOMY     COLONOSCOPY WITH PROPOFOL N/A 12/23/2020   Procedure: COLONOSCOPY WITH PROPOFOL;  Surgeon: Thornton Park, MD;  Location: [facility];  Service: Gastroenterology;  Laterality: N/A;   CYSTOCELE REPAIR     DILATION AND CURETTAGE OF UTERUS     ESOPHAGOGASTRODUODENOSCOPY (EGD) WITH PROPOFOL N/A 08/06/2020   Procedure: ESOPHAGOGASTRODUODENOSCOPY (EGD) WITH PROPOFOL;  Surgeon: Eloise Harman, DO;  [facility];  Service: Endoscopy;  Laterality: N/A;   ESOPHAGOGASTRODUODENOSCOPY (EGD) WITH PROPOFOL N/A 12/21/2020   Procedure: ESOPHAGOGASTRODUODENOSCOPY (EGD) WITH PROPOFOL;  Surgeon: Thornton Park, MD;  Location: [facility];  Service: Gastroenterology;  Laterality: N/A;   EYE SURGERY Left 03-30-13   Cataract   EYE SURGERY Right 04-09-13   Cataract   RECTOCELE REPAIR     THYROIDECTOMY     TONSILLECTOMY     TOTAL ABDOMINAL HYSTERECTOMY      Social History   Socioeconomic History   Marital status: Married    Spouse name: Not on file   Number of children: 4   Years of education: Not on file   Highest education level: Not on file  Occupational History   Occupation: Retired Optician, dispensing: RETIRED  Tobacco Use   Smoking status: Never   Smokeless tobacco: Never  Vaping Use   Vaping Use: Never used  Substance and Sexual Activity   Alcohol use: No   Drug use: No   Sexual activity: Not on file  Other Topics Concern   Not on file  Social History Narrative   Not on file   Social Determinants of Health   Financial Resource Strain: Not on file  Food Insecurity: Not on file  Transportation Needs: Not on file  Physical Activity: Not on file  Stress: Not on file  Social Connections: Not on file  Intimate Partner Violence: Not on file   Family History  Problem Relation Age of Onset   Sarcoidosis Mother    Diabetes Mother    Cancer Mother        sarcoma -  Right arm amputation   Hypertension Mother    Lung cancer Father     Diabetes Father    Heart disease Father        Before age 67   Urolithiasis Father    Cancer Father        Lung   Hyperlipidemia Father    Hypertension Father    Esophageal cancer Maternal Uncle    Ovarian cancer Maternal Aunt    Colon cancer Maternal Uncle    Lung cancer Maternal Uncle    Diabetes Maternal Uncle    Diabetes Maternal Aunt    Colon polyps Maternal Uncle    Heart disease Paternal Grandmother       VITAL SIGNS BP (!) 133/58   Pulse 70   Temp 97.6  F (36.4 C)   Resp 20   Ht '5\' 6"'$  (1.676 m)   Wt 158 lb (71.7 kg)   SpO2 99%   BMI 25.50 kg/m   Outpatient Encounter Medications as of 05/23/2022  Medication Sig   ASPIRIN LOW DOSE 81 MG tablet Take 81 mg by mouth every morning.   DULoxetine (CYMBALTA) 60 MG capsule Take 60 mg by mouth daily.   EPINEPHrine 0.3 mg/0.3 mL IJ SOAJ injection Inject 0.3 mg into the muscle daily as needed for anaphylaxis.   furosemide (LASIX) 20 MG tablet Take 20 mg by mouth daily.   Insulin Glargine-Lixisenatide (SOLIQUA) 100-33 UNT-MCG/ML SOPN Inject 0-20 Units into the skin daily. Sliding Scale   Insulin Glargine-Lixisenatide 100-33 UNT-MCG/ML SOPN Inject 20-25 Units into the skin daily. Pt adjust between 20-25 units   JARDIANCE 25 MG TABS tablet Take 25 mg by mouth daily.   levothyroxine (SYNTHROID) 150 MCG tablet Take 1 tablet (150 mcg total) by mouth daily at 6 (six) AM.   linaclotide (LINZESS) 290 MCG CAPS capsule Take 1 capsule (290 mcg total) by mouth daily before breakfast.   Melatonin 10 MG TABS Take 10 mg by mouth at bedtime as needed (sleep).   midodrine (PROAMATINE) 2.5 MG tablet Take 1 tablet (2.5 mg total) by mouth 3 (three) times daily with meals.   No facility-administered encounter medications on file as of 05/23/2022.     SIGNIFICANT DIAGNOSTIC EXAMS  TODAY  05-10-22 ct of head:  No acute intracranial abnormalities. Chronic atrophy and small vessel ischemic changes. Ventricular dilatation, likely central  atrophy but could indicate normal pressure hydrocephalus in the appropriate clinical setting. No change since prior study.  05-15-22: chest x-ray:  Lungs volumes are small, but are symmetric and are clear. No pneumothorax or pleural effusion. Cardiac size within normal limits. Pulmonary vascularity is normal. Osseous structures are age-appropriate. No acute bone abnormality.  05-16-22: ct of chest abdomen and pelvis:  IMPRESSION: 1. Mild cardiomegaly with prominent central pulmonary veins, interstitial edema in the lung bases and apices and minimal pleural effusions. Findings consistent with mild CHF or fluid overload. 2. Diffuse bronchial thickening which could indicate bronchitis or congestive bronchial thickening. No evidence of bronchopneumonia. 3. A few slightly prominent mediastinal nodes. No bulky or encasing adenopathy. 4. Aortic and coronary artery atherosclerosis and upper limit of normal pulmonary trunk caliber. 5. Minimal air in the pulmonary trunk likely iatrogenic. 6. Moderate-sized hiatal hernia. 7. Paraduodenal stranding most likely due to duodenitis or inflamed ulcerative disease. Not likely related to the diffusely atrophic pancreas but clinical correlation advised. No free air or abscess. 8. Constipation with diverticulosis. 9. Minimal ascites.  Mild body wall edema.   LABS REVIEWED:   05-15-22: wbc 21.7; hgb 10.0; hct 32.7; mcv 80.7 plt 429; glucose 240; bun 34; creat 1.68; k+ 4.3; na++ 128; ca 8.3; gfr 31; protein 6.6 albumin 3.4 total bili 1.7; tsh 9.865; blood and urine culture: no growth; hgb a1c 10.8; mag 1.4 05-16-22; free t3: 3.3; free t4: 2.02; mag 1.6; cortisol 4.1; Bnp 497.0 05-17-22: wbc 29.6; hgb 10.0; hct 31.3; mcv 78.4 plt 458; glucose 273; bun 24; creat 0.95; k+ 3.1; na++ 131; ca 8.1; gfr >60; protein 6.5 albumin 3.2; hgb a1c 10.4; mag 1.7; BNP 678.0 05-21-22: wbc 16.4; hgb 11.3; hct 37.3; mcv 81.1 plt 500; glucose 145; bun 20; creat 0.63; k+ 3.7; na++ 135; ca 8.8; gfr  >60; mag 1.8 05-22-22: glucose 194; bun 17; creat 0.55; k+ 3.4; na++ 136; ca 8.6; gfr >60  Review of Systems  Constitutional:  Negative for malaise/fatigue.  Respiratory:  Negative for cough and shortness of breath.   Cardiovascular:  Negative for chest pain, palpitations and leg swelling.  Gastrointestinal:  Negative for abdominal pain, constipation and heartburn.  Musculoskeletal:  Negative for back pain, joint pain and myalgias.  Skin: Negative.   Neurological:  Negative for dizziness.  Psychiatric/Behavioral:  The patient is not nervous/anxious.     Physical Exam Constitutional:      General: She is not in acute distress.    Appearance: She is well-developed. She is not diaphoretic.  Neck:     Thyroid: No thyromegaly.  Cardiovascular:     Rate and Rhythm: Normal rate and regular rhythm.     Pulses: Normal pulses.     Heart sounds: Normal heart sounds.  Pulmonary:     Effort: Pulmonary effort is normal. No respiratory distress.     Breath sounds: Normal breath sounds.  Abdominal:     General: Bowel sounds are normal. There is no distension.     Palpations: Abdomen is soft.     Tenderness: There is no abdominal tenderness.  Musculoskeletal:        General: Normal range of motion.     Cervical back: Neck supple.     Right lower leg: No edema.     Left lower leg: No edema.  Lymphadenopathy:     Cervical: No cervical adenopathy.  Skin:    General: Skin is warm and dry.  Neurological:     Mental Status: She is alert. Mental status is at baseline.  Psychiatric:        Mood and Affect: Mood normal.      ASSESSMENT/ PLAN:  TODAY  SIRS (systemic inflammatory response syndrome) /acute kidney injury/ acute metabolic encephalopathy: is resolving: will continue therapy as directed to improve upon her level of independence with her adls.   2.  Uncontrolled type 2 diabetes mellitus with hyperglycemia with current long term use of insulin: hgb a1c 10.4; will continue  jardiance 25 mg daily soliqua; 100-33/ 20 units daily  asa 81 mg daily   3.  Hypothyroidism unspecified type: tsh 9.865; has restarted synthroid 150 mcg daily   4. Chronic constipation: will continue linzess 290 mcg daily   5. Aortic atherosclerosis ( 05-16-22 ct) is on asa   6. Hypertension associated with type 2 diabetes mellitus: b/p 133/58 is currently on midodrine 2.5 mg with meals; due to soft blood pressure readings; will monitor twice daily and will make further changes as needed  7. Fibromyalgia: pain is managed on cymbalta 60 mg daily   8. Hypoalbuminemia due to protein calorie malnutrition: protein 6.5 albumin 3.2   9.  Cognitive impairment: ct of head demonstrates: chronic atrophy and small vessel ischemic changes.   10. Bilateral lower extremity edema: will continue lasix 20 mg daily    Will get bmp    Ok Edwards NP [facility]   call [phone]

## 2022-05-24 ENCOUNTER — Non-Acute Institutional Stay (SKILLED_NURSING_FACILITY): Payer: Medicare HMO | Admitting: Internal Medicine

## 2022-05-24 ENCOUNTER — Encounter (HOSPITAL_COMMUNITY)
Admission: RE | Admit: 2022-05-24 | Discharge: 2022-05-24 | Disposition: A | Discharge status: Home / Self Care | Payer: Medicare HMO | Source: Skilled Nursing Facility | Attending: Adult Health | Admitting: Adult Health

## 2022-05-24 ENCOUNTER — Encounter: Payer: Self-pay | Admitting: Internal Medicine

## 2022-05-24 DIAGNOSIS — Z794 Long term (current) use of insulin: Secondary | ICD-10-CM

## 2022-05-24 DIAGNOSIS — R6510 Systemic inflammatory response syndrome (SIRS) of non-infectious origin without acute organ dysfunction: Secondary | ICD-10-CM

## 2022-05-24 DIAGNOSIS — N179 Acute kidney failure, unspecified: Secondary | ICD-10-CM | POA: Diagnosis not present

## 2022-05-24 DIAGNOSIS — E1165 Type 2 diabetes mellitus with hyperglycemia: Secondary | ICD-10-CM

## 2022-05-24 DIAGNOSIS — E039 Hypothyroidism, unspecified: Secondary | ICD-10-CM | POA: Diagnosis not present

## 2022-05-24 DIAGNOSIS — E1159 Type 2 diabetes mellitus with other circulatory complications: Secondary | ICD-10-CM | POA: Insufficient documentation

## 2022-05-24 LAB — BASIC METABOLIC PANEL
Anion gap: 9 (ref 5–15)
BUN: 20 mg/dL (ref 8–23)
CO2: 28 mmol/L (ref 22–32)
Calcium: 8.9 mg/dL (ref 8.9–10.3)
Chloride: 99 mmol/L (ref 98–111)
Creatinine, Ser: 0.74 mg/dL (ref 0.44–1.00)
GFR, Estimated: >60 mL/min (ref >60)
Glucose, Bld: 115 mg/dL — ABNORMAL HIGH (ref 70–99)
Potassium: 3.1 mmol/L — ABNORMAL LOW (ref 3.5–5.1)
Sodium: 136 mmol/L (ref 135–145)

## 2022-05-24 NOTE — Assessment & Plan Note (Addendum)
Vital signs are stable with no signs of sepsis or SIRS syndrome.Risks of uncontrolled DM ( infection , CKD, PAD, MI & CVA ) discussed with her.

## 2022-05-24 NOTE — Assessment & Plan Note (Signed)
AKI with nadir GFR of 31 and creatinine 1.68 resolved.  At discharge creatinine 0 0.55 and GFR greater than 60.  Med list reviewed; no indication for change unless there is progression of CKD.

## 2022-05-24 NOTE — Assessment & Plan Note (Addendum)
DM uncontrolled as per A1c of 10.4% on 05/17/2022.  Glucoses while hospitalized 114 up to 301.  Approximately 68% increased risk of stroke or heart attack with A1c of 10.4% discussed with her.  Read all labels ; avoid foods & drinks with  High Fructose Corn Syrup as #1, 2 , 3 or # 4 on label.Dividing the "grams of sugar" on label by 4 gives "teaspoons of sugar" content of food or drink. For example a 22 oz Coke has 68 grams of sugar or 17 tsp of sugar. F/U with Dr Braulio Conte , [facility]

## 2022-05-24 NOTE — Patient Instructions (Signed)
See assessment and plan under each diagnosis in the problem list and acutely for this visit 

## 2022-05-24 NOTE — Progress Notes (Signed)
NURSING HOME LOCATION:  [facility] ROOM NUMBER: 125 P  CODE STATUS: Full code  PCP:  Stoney Bang MD  This is a comprehensive admission note to this SNFperformed on this date less than 30 days from date of admission. Included are preadmission medical/surgical history; reconciled medication list; family history; social history and comprehensive review of systems.  Corrections and additions to the records were documented. Comprehensive physical exam was also performed. Additionally a clinical summary was entered for each active diagnosis pertinent to this admission in the Problem List to enhance continuity of care.  HPI: She was hospitalized 8/8 - 05/22/2022 presenting with 1 week history of generalized weakness, decreased oral intake,&  decreased urine output due to SIRS syndrome.  At presentation she was hypotensive with a blood pressure of 83/36 and temp of 96.6.  History was limited due to acute metabolic encephalopathy. Levophed was initiated for the hypotension.  Leukocytosis was present with a peak white count of 29,600.  AKI was present with a peak creatinine 1.68 and nadir GFR of 31.  Peak lactic was 2.2.  Procalcitonin was less than 0.1 X 2.  Empiric vancomycin and cefepime were continued until cultures returned with no growth.  Hypokalemia, hyponatremia, and hypomagnesemia were repleted.  BNP was 497.  The AKI was attributed to volume depletion and hypotension.  AKI reversed and at discharge creatinine was 0.55 and GFR greater than 60. Blood pressure remained soft and midodrine was initiated. TSH was 9.865 attributed to possible noncompliance with her L-thyroxine supplement. Glucoses while hospitalized ranged from a low of 114 up to a high of 301.  A1c was 10.4% indicating poorly controlled diabetes. Discharge to SNF was recommended for PT/OT due to generalized debilitation.  Past medical and surgical history is long and complicated and includes CAD with angina,  history of breast cancer, diabetes with PAD, history of colon polyps, diverticulosis, GERD, dyslipidemia, fibromyalgia, essential hypertension, hypothyroidism, IBS, history of nephrolithiasis, and OSA. She states that she has had septic shock before in the context of lithotripsy. Procedures and surgeries include breast lumpectomy, cervical fusion, cholecystectomy, colonoscopy, EGD, thyroid surgery, cardiac cath, and TAH.  Social history: Nondrinker , non-smoker.  Family history: Extensive history reviewed; there is a strong family history of diabetes and malignancy.   Review of systems: She describes insomnia since the hospitalization.  She also states she has had some white vaginal discharge.  She validates that she has been told she snores and has history of OSA but has been noncompliant with her CPAP.  She is followed by Dr. Braulio Conte at [facility] for her diabetes.  She describes rare hypoglycemia and states that it is more frequent for her to have hyperglycemia as she admits to dietary noncompliance.  Peaks home glucoses are over 200 .  She states that the fasting blood sugars vary depending on her dietary compliance but typically had been 140-150.  She states she feels she had been compliant with her thyroid replacement despite the elevated TSH.  Constitutional: No fever, significant weight change  Eyes: No redness, discharge, pain, vision change ENT/mouth: No nasal congestion, purulent discharge, earache, change in hearing, sore throat  Cardiovascular: No chest pain, palpitations, paroxysmal nocturnal dyspnea, claudication, edema  Respiratory: No cough, sputum production, hemoptysis, DOE Gastrointestinal: No heartburn, dysphagia, abdominal pain, nausea /vomiting, rectal bleeding, melena, change in bowels Genitourinary: No dysuria, hematuria, pyuria, incontinence, nocturia Musculoskeletal: No joint stiffness, joint swelling, weakness, pain Dermatologic: No rash, pruritus, change in appearance  of skin Neurologic:  No dizziness, headache, syncope, seizures, numbness, tingling Psychiatric: No significant anxiety, depression, anorexia Endocrine: No change in hair/skin/nails, excessive thirst, excessive hunger, excessive urination  Hematologic/lymphatic: No significant bruising, lymphadenopathy, abnormal bleeding Allergy/immunology: No itchy/watery eyes, significant sneezing, urticaria, angioedema  Physical exam:  Pertinent or positive findings: She appears her age and somewhat chronically ill. Hair is disheveled.  Affect is somewhat flat.  Eyebrows are decreased laterally.  There is a horizontal scar at the hairline on the left.  Slight exotropia of the right eye is present.  She has ptosis bilaterally.  First and second heart sounds are increased.  Pedal pulses are decreased.  She has 1/2+ edema at the sock line on the right and trace at the left ankle.  General appearance : no increased WOB or acute distress No lymphadenopathy about the head, neck, axilla. Eyes: No conjunctival inflammation or lid edema is present. There is no scleral icterus. Ears:  External ear exam shows no significant lesions or deformities.   Nose:  External nasal examination shows no deformity or inflammation. Nasal mucosa are pink and moist without lesions, exudates Oral exam: Lips and gums are healthy appearing.There is no oropharyngeal erythema or exudate. Neck:  No thyromegaly, masses, tenderness noted.    Heart:  Normal rate and regular rhythm  without gallop, murmur, click, rub.  Lungs: Chest clear to auscultation without wheezes, rhonchi, rales, rubs. Abdomen: Bowel sounds are normal.  Abdomen is soft and nontender with no organomegaly, hernias, masses. GU: Deferred  Extremities:  No cyanosis, clubbing. Neurologic exam: Balance, Rhomberg, finger to nose testing could not be completed due to clinical state Skin: Warm & dry w/o tenting. No significant lesions or rash.  See clinical summary under each  active problem in the Problem List with associated updated therapeutic plan

## 2022-05-24 NOTE — Assessment & Plan Note (Signed)
05/24/2022 patient maintains that she believes she was compliant with her L-thyroxine supplementation.  I recommended that TSH be rechecked in 6-8 weeks to assess possible need to increase dosage.  TSH goal 1-5.

## 2022-05-28 ENCOUNTER — Encounter (HOSPITAL_COMMUNITY)
Admission: RE | Admit: 2022-05-28 | Discharge: 2022-05-28 | Disposition: A | Discharge status: Home / Self Care | Payer: Medicare HMO | Source: Skilled Nursing Facility | Attending: Adult Health | Admitting: Adult Health

## 2022-05-28 ENCOUNTER — Encounter: Payer: Self-pay | Admitting: Adult Health

## 2022-05-28 ENCOUNTER — Non-Acute Institutional Stay (SKILLED_NURSING_FACILITY): Payer: Medicare HMO | Admitting: Adult Health

## 2022-05-28 DIAGNOSIS — E1159 Type 2 diabetes mellitus with other circulatory complications: Secondary | ICD-10-CM

## 2022-05-28 DIAGNOSIS — E876 Hypokalemia: Secondary | ICD-10-CM

## 2022-05-28 DIAGNOSIS — I152 Hypertension secondary to endocrine disorders: Secondary | ICD-10-CM | POA: Diagnosis not present

## 2022-05-28 LAB — BASIC METABOLIC PANEL
Anion gap: 11 (ref 5–15)
BUN: 17 mg/dL (ref 8–23)
CO2: 26 mmol/L (ref 22–32)
Calcium: 9 mg/dL (ref 8.9–10.3)
Chloride: 98 mmol/L (ref 98–111)
Creatinine, Ser: 0.51 mg/dL (ref 0.44–1.00)
GFR, Estimated: >60 mL/min (ref >60)
Glucose, Bld: 168 mg/dL — ABNORMAL HIGH (ref 70–99)
Potassium: 3.1 mmol/L — ABNORMAL LOW (ref 3.5–5.1)
Sodium: 135 mmol/L (ref 135–145)

## 2022-05-28 NOTE — Progress Notes (Signed)
Location:  [place] Room Number: 125 Place of Service:  SNF (31)   CODE STATUS: full  Allergies  Allergen Reactions   Codeine Hives   Sulfonamide Derivatives Hives   Canagliflozin    Penicillins Hives    Pt states she does NOT have allergy to penicillin   Sulfa Antibiotics Other (See Comments)    unknown    Chief Complaint  Patient presents with   Acute Visit    Follow up labs.     HPI:  Her k+ level is 3.1 has been less than 3.5 since 05-22-22. She is presently taking lasix 20 mg daily with k+ 40 meq daily. There are no reports of shortness of breath; no lower extremity edema.   Past Medical History:  Diagnosis Date   Allergy    Anemia    Anginal pain ([place])    Anxiety    Arthritis    Blood transfusion without reported diagnosis    Breast cancer ([place])    Carotid artery plaque    bilat   Cataract    Colon polyps    adenomatous   Concussion 02/2016   Driving restrictions for 6w   Coronary heart disease    Depression    Diverticulosis    Dizziness    Esophageal reflux    Esophageal stricture    Fibromyalgia    Gallstones    Hiatal hernia    Hypercholesterolemia    Hypertension    Hypothyroidism    IBS (irritable bowel syndrome)    Kidney stones    Osteoporosis    Pneumonia    recently, just finished ABO   PONV (postoperative nausea and vomiting)    Sleep apnea    CPAP   Thyroid disease 2002   Type II or unspecified type diabetes mellitus without mention of complication, not stated as uncontrolled    Unspecified gastritis and gastroduodenitis without mention of hemorrhage     Past Surgical History:  Procedure Laterality Date   ANAL RECTAL MANOMETRY N/A 08/05/2015   Procedure: ANO RECTAL MANOMETRY;  Surgeon: Mauri Pole, MD;  [facility];  Service: Endoscopy;  Laterality: N/A;   APPENDECTOMY     BIOPSY  08/06/2020   Procedure: BIOPSY;  Surgeon: Eloise Harman, DO;  [facility];  Service:  Endoscopy;;   BIOPSY  12/21/2020   Procedure: BIOPSY;  Surgeon: Thornton Park, MD;  [facility];  Service: Gastroenterology;;   BIOPSY  12/23/2020   Procedure: BIOPSY;  Surgeon: Thornton Park, MD;  [facility];  Service: Gastroenterology;;   BREAST LUMPECTOMY     left   CARDIAC CATHETERIZATION     CATARACT EXTRACTION W/PHACO Left 03/30/2013   Procedure: CATARACT EXTRACTION PHACO AND INTRAOCULAR LENS PLACEMENT ([place]);  Surgeon: Tonny Branch, MD;  Location: [facility];  Service: Ophthalmology;  Laterality: Left;  CDE:19.28   CATARACT EXTRACTION W/PHACO Right 04/09/2013   Procedure: CATARACT EXTRACTION PHACO AND INTRAOCULAR LENS PLACEMENT ([facility]);  Surgeon: Tonny Branch, MD;  Location: [facility];  Service: Ophthalmology;  Laterality: Right;  CDE: 15.44   CERVICAL FUSION     CESAREAN SECTION     CHOLECYSTECTOMY     COLONOSCOPY WITH PROPOFOL N/A 12/23/2020   Procedure: COLONOSCOPY WITH PROPOFOL;  Surgeon: Thornton Park, MD;  [facility];  Service: Gastroenterology;  Laterality: N/A;   CYSTOCELE REPAIR     DILATION AND CURETTAGE OF UTERUS     ESOPHAGOGASTRODUODENOSCOPY (EGD) WITH PROPOFOL N/A 08/06/2020   Procedure:  ESOPHAGOGASTRODUODENOSCOPY (EGD) WITH PROPOFOL;  Surgeon: Eloise Harman, DO;  [facility];  Service: Endoscopy;  Laterality: N/A;   ESOPHAGOGASTRODUODENOSCOPY (EGD) WITH PROPOFOL N/A 12/21/2020   Procedure: ESOPHAGOGASTRODUODENOSCOPY (EGD) WITH PROPOFOL;  Surgeon: Thornton Park, MD;  [facility];  Service: Gastroenterology;  Laterality: N/A;   EYE SURGERY Left 03-30-13   Cataract   EYE SURGERY Right 04-09-13   Cataract   RECTOCELE REPAIR     THYROIDECTOMY     TONSILLECTOMY     TOTAL ABDOMINAL HYSTERECTOMY      Social History   Socioeconomic History   Marital status: Married    Spouse name: Not on file   Number of children: 4   Years of education: Not on file   Highest education level: Not on file  Occupational History    Occupation: Retired Optician, dispensing: RETIRED  Tobacco Use   Smoking status: Never   Smokeless tobacco: Never  Vaping Use   Vaping Use: Never used  Substance and Sexual Activity   Alcohol use: No   Drug use: No   Sexual activity: Not on file  Other Topics Concern   Not on file  Social History Narrative   Not on file   Social Determinants of Health   Financial Resource Strain: Not on file  Food Insecurity: Not on file  Transportation Needs: Not on file  Physical Activity: Not on file  Stress: Not on file  Social Connections: Not on file  Intimate Partner Violence: Not on file   Family History  Problem Relation Age of Onset   Sarcoidosis Mother    Diabetes Mother    Cancer Mother        sarcoma -  Right arm amputation   Hypertension Mother    Lung cancer Father    Diabetes Father    Heart disease Father        Before age 59   Urolithiasis Father    Cancer Father        Lung   Hyperlipidemia Father    Hypertension Father    Esophageal cancer Maternal Uncle    Ovarian cancer Maternal Aunt    Colon cancer Maternal Uncle    Lung cancer Maternal Uncle    Diabetes Maternal Uncle    Diabetes Maternal Aunt    Colon polyps Maternal Uncle    Heart disease Paternal Grandmother       VITAL SIGNS BP 122/66   Pulse 79   Temp (!) 97.4 F (36.3 C)   Resp 20   Ht '5\' 6"'$  (1.676 m)   Wt 157 lb 6.4 oz (71.4 kg)   SpO2 95%   BMI 25.41 kg/m   Outpatient Encounter Medications as of 05/28/2022  Medication Sig   ASPIRIN LOW DOSE 81 MG tablet Take 81 mg by mouth every morning.   DULoxetine (CYMBALTA) 60 MG capsule Take 60 mg by mouth daily.   EPINEPHrine 0.3 mg/0.3 mL IJ SOAJ injection Inject 0.3 mg into the muscle daily as needed for anaphylaxis.   furosemide (LASIX) 20 MG tablet Take 20 mg by mouth daily.   Insulin Glargine-Lixisenatide (SOLIQUA) 100-33 UNT-MCG/ML SOPN Inject 0-20 Units into the skin daily. Sliding Scale   Insulin Glargine-Lixisenatide 100-33  UNT-MCG/ML SOPN Inject 20-25 Units into the skin daily. Pt adjust between 20-25 units   JARDIANCE 25 MG TABS tablet Take 25 mg by mouth daily.   levothyroxine (SYNTHROID) 150 MCG tablet Take 1 tablet (150 mcg total) by mouth daily at  6 (six) AM.   linaclotide (LINZESS) 290 MCG CAPS capsule Take 1 capsule (290 mcg total) by mouth daily before breakfast.   Melatonin 10 MG TABS Take 10 mg by mouth at bedtime as needed (sleep).   midodrine (PROAMATINE) 2.5 MG tablet Take 1 tablet (2.5 mg total) by mouth 3 (three) times daily with meals.   No facility-administered encounter medications on file as of 05/28/2022.     SIGNIFICANT DIAGNOSTIC EXAMS  PREVIOUS   05-10-22 ct of head:  No acute intracranial abnormalities. Chronic atrophy and small vessel ischemic changes. Ventricular dilatation, likely central atrophy but could indicate normal pressure hydrocephalus in the appropriate clinical setting. No change since prior study.  05-15-22: chest x-ray:  Lungs volumes are small, but are symmetric and are clear. No pneumothorax or pleural effusion. Cardiac size within normal limits. Pulmonary vascularity is normal. Osseous structures are age-appropriate. No acute bone abnormality.  05-16-22: ct of chest abdomen and pelvis:  IMPRESSION: 1. Mild cardiomegaly with prominent central pulmonary veins, interstitial edema in the lung bases and apices and minimal pleural effusions. Findings consistent with mild CHF or fluid overload. 2. Diffuse bronchial thickening which could indicate bronchitis or congestive bronchial thickening. No evidence of bronchopneumonia. 3. A few slightly prominent mediastinal nodes. No bulky or encasing adenopathy. 4. Aortic and coronary artery atherosclerosis and upper limit of normal pulmonary trunk caliber. 5. Minimal air in the pulmonary trunk likely iatrogenic. 6. Moderate-sized hiatal hernia. 7. Paraduodenal stranding most likely due to duodenitis or inflamed ulcerative disease. Not  likely related to the diffusely atrophic pancreas but clinical correlation advised. No free air or abscess. 8. Constipation with diverticulosis. 9. Minimal ascites.  Mild body wall edema.  NO NEW EXAMS.    LABS REVIEWED: PREVIOUS   05-15-22: wbc 21.7; hgb 10.0; hct 32.7; mcv 80.7 plt 429; glucose 240; bun 34; creat 1.68; k+ 4.3; na++ 128; ca 8.3; gfr 31; protein 6.6 albumin 3.4 total bili 1.7; tsh 9.865; blood and urine culture: no growth; hgb a1c 10.8; mag 1.4 05-16-22; free t3: 3.3; free t4: 2.02; mag 1.6; cortisol 4.1; Bnp 497.0 05-17-22: wbc 29.6; hgb 10.0; hct 31.3; mcv 78.4 plt 458; glucose 273; bun 24; creat 0.95; k+ 3.1; na++ 131; ca 8.1; gfr >60; protein 6.5 albumin 3.2; hgb a1c 10.4; mag 1.7; BNP 678.0 05-21-22: wbc 16.4; hgb 11.3; hct 37.3; mcv 81.1 plt 500; glucose 145; bun 20; creat 0.63; k+ 3.7; na++ 135; ca 8.8; gfr >60; mag 1.8 05-22-22: glucose 194; bun 17; creat 0.55; k+ 3.4; na++ 136; ca 8.6; gfr >60   TODAY  05-28-22: glucose 168; bun 17; creat 0.51; k+ 3.1; na++ 135; ca 9.0; gfr >60   Review of Systems  Constitutional:  Negative for malaise/fatigue.  Respiratory:  Negative for cough and shortness of breath.   Cardiovascular:  Negative for chest pain, palpitations and leg swelling.  Gastrointestinal:  Negative for abdominal pain, constipation and heartburn.  Musculoskeletal:  Negative for back pain, joint pain and myalgias.  Skin: Negative.   Neurological:  Negative for dizziness.  Psychiatric/Behavioral:  The patient is not nervous/anxious.     Physical Exam Constitutional:      General: She is not in acute distress.    Appearance: She is well-developed. She is not diaphoretic.  Neck:     Thyroid: No thyromegaly.  Cardiovascular:     Rate and Rhythm: Normal rate and regular rhythm.     Pulses: Normal pulses.     Heart sounds: Normal heart sounds.  Pulmonary:  Effort: Pulmonary effort is normal. No respiratory distress.     Breath sounds: Normal breath sounds.   Abdominal:     General: Bowel sounds are normal. There is no distension.     Palpations: Abdomen is soft.     Tenderness: There is no abdominal tenderness.  Musculoskeletal:        General: Normal range of motion.     Cervical back: Neck supple.     Right lower leg: No edema.     Left lower leg: No edema.  Lymphadenopathy:     Cervical: No cervical adenopathy.  Skin:    General: Skin is warm and dry.  Neurological:     Mental Status: She is alert. Mental status is at baseline.  Psychiatric:        Mood and Affect: Mood normal.       ASSESSMENT/ PLAN:  TODAY  Hypertension associated with type 2 diabetes mellitus Hypokalemia  Blood pressure readings are stable Will increase her k+ to 40 meq twice daily  Will repeat k+ level on 06-04-22.   Ok Edwards NP [facility]  call [phone]

## 2022-06-01 ENCOUNTER — Non-Acute Institutional Stay (SKILLED_NURSING_FACILITY): Payer: Medicare HMO | Admitting: Adult Health

## 2022-06-01 ENCOUNTER — Encounter: Payer: Self-pay | Admitting: Adult Health

## 2022-06-01 ENCOUNTER — Other Ambulatory Visit: Payer: Self-pay | Admitting: Adult Health

## 2022-06-01 DIAGNOSIS — R6510 Systemic inflammatory response syndrome (SIRS) of non-infectious origin without acute organ dysfunction: Secondary | ICD-10-CM

## 2022-06-01 DIAGNOSIS — Z794 Long term (current) use of insulin: Secondary | ICD-10-CM

## 2022-06-01 DIAGNOSIS — I152 Hypertension secondary to endocrine disorders: Secondary | ICD-10-CM

## 2022-06-01 DIAGNOSIS — I700 Atherosclerosis of aorta: Secondary | ICD-10-CM | POA: Diagnosis not present

## 2022-06-01 DIAGNOSIS — E1159 Type 2 diabetes mellitus with other circulatory complications: Secondary | ICD-10-CM | POA: Diagnosis not present

## 2022-06-01 DIAGNOSIS — E1165 Type 2 diabetes mellitus with hyperglycemia: Secondary | ICD-10-CM | POA: Diagnosis not present

## 2022-06-01 MED ORDER — KETOCONAZOLE 2 % EX CREA
1.0000 | TOPICAL_CREAM | Freq: Two times a day (BID) | CUTANEOUS | 0 refills | Status: DC
Start: 2022-06-01 — End: 2022-09-16

## 2022-06-01 MED ORDER — MIDODRINE HCL 2.5 MG PO TABS
2.5000 mg | ORAL_TABLET | Freq: Three times a day (TID) | ORAL | 0 refills | Status: AC
Start: 2022-06-01 — End: 2022-07-01

## 2022-06-01 MED ORDER — DULOXETINE HCL 60 MG PO CPEP
60.0000 mg | ORAL_CAPSULE | Freq: Every day | ORAL | 0 refills | Status: DC
Start: 2022-06-01 — End: 2022-09-16

## 2022-06-01 MED ORDER — POTASSIUM CHLORIDE CRYS ER 20 MEQ PO TBCR: 40.0000 meq | EXTENDED_RELEASE_TABLET | Freq: Two times a day (BID) | ORAL | 0 refills | Status: DC

## 2022-06-01 MED ORDER — LEVOTHYROXINE SODIUM 150 MCG PO TABS: 150.0000 ug | ORAL_TABLET | Freq: Every day | ORAL | 0 refills | Status: DC

## 2022-06-01 MED ORDER — SOLIQUA 100-33 UNT-MCG/ML ~~LOC~~ SOPN: 20.0000 [IU] | PEN_INJECTOR | Freq: Every day | SUBCUTANEOUS | 0 refills | Status: AC

## 2022-06-01 MED ORDER — LINACLOTIDE 290 MCG PO CAPS
290.0000 ug | ORAL_CAPSULE | Freq: Every day | ORAL | 0 refills | Status: DC
Start: 2022-06-01 — End: 2022-09-16

## 2022-06-01 MED ORDER — JARDIANCE 25 MG PO TABS: 25.0000 mg | ORAL_TABLET | Freq: Every day | ORAL | 0 refills | Status: AC

## 2022-06-01 MED ORDER — FUROSEMIDE 20 MG PO TABS: 20.0000 mg | ORAL_TABLET | Freq: Every day | ORAL | 0 refills | Status: AC

## 2022-06-01 NOTE — Progress Notes (Unsigned)
Location:  [place] Room Number: 125-P Place of Service:  SNF (31)   CODE STATUS: Full Code  Allergies  Allergen Reactions   Codeine Hives   Sulfonamide Derivatives Hives   Canagliflozin    Penicillins Hives    Pt states she does NOT have allergy to penicillin   Sulfa Antibiotics Other (See Comments)    unknown    Chief Complaint  Patient presents with   Discharge Note    HPI:    Past Medical History:  Diagnosis Date   Allergy    Anemia    Anginal pain ([place])    Anxiety    Arthritis    Blood transfusion without reported diagnosis    Breast cancer ([place])    Carotid artery plaque    bilat   Cataract    Colon polyps    adenomatous   Concussion 02/2016   Driving restrictions for 6w   Coronary heart disease    Depression    Diverticulosis    Dizziness    Esophageal reflux    Esophageal stricture    Fibromyalgia    Gallstones    Hiatal hernia    Hypercholesterolemia    Hypertension    Hypothyroidism    IBS (irritable bowel syndrome)    Kidney stones    Osteoporosis    Pneumonia    recently, just finished ABO   PONV (postoperative nausea and vomiting)    Sleep apnea    CPAP   Thyroid disease 2002   Type II or unspecified type diabetes mellitus without mention of complication, not stated as uncontrolled    Unspecified gastritis and gastroduodenitis without mention of hemorrhage     Past Surgical History:  Procedure Laterality Date   ANAL RECTAL MANOMETRY N/A 08/05/2015   Procedure: ANO RECTAL MANOMETRY;  Surgeon: Mauri Pole, MD;  [facility];  Service: Endoscopy;  Laterality: N/A;   APPENDECTOMY     BIOPSY  08/06/2020   Procedure: BIOPSY;  Surgeon: Eloise Harman, DO;  [facility];  Service: Endoscopy;;   BIOPSY  12/21/2020   Procedure: BIOPSY;  Surgeon: Thornton Park, MD;  [facility];  Service: Gastroenterology;;   BIOPSY  12/23/2020   Procedure: BIOPSY;  Surgeon: Thornton Park, MD;  [facility];  Service: Gastroenterology;;   BREAST LUMPECTOMY     left   CARDIAC CATHETERIZATION     CATARACT EXTRACTION W/PHACO Left 03/30/2013   Procedure: CATARACT EXTRACTION PHACO AND INTRAOCULAR LENS PLACEMENT ([place]);  Surgeon: Tonny Branch, MD;  [facility];  Service: Ophthalmology;  Laterality: Left;  CDE:19.28   CATARACT EXTRACTION W/PHACO Right 04/09/2013   Procedure: CATARACT EXTRACTION PHACO AND INTRAOCULAR LENS PLACEMENT ([facility]);  Surgeon: Tonny Branch, MD;  Location: [facility];  Service: Ophthalmology;  Laterality: Right;  CDE: 15.44   CERVICAL FUSION     CESAREAN SECTION     CHOLECYSTECTOMY     COLONOSCOPY WITH PROPOFOL N/A 12/23/2020   Procedure: COLONOSCOPY WITH PROPOFOL;  Surgeon: Thornton Park, MD;  [facility];  Service: Gastroenterology;  Laterality: N/A;   CYSTOCELE REPAIR     DILATION AND CURETTAGE OF UTERUS     ESOPHAGOGASTRODUODENOSCOPY (EGD) WITH PROPOFOL N/A 08/06/2020   Procedure: ESOPHAGOGASTRODUODENOSCOPY (EGD) WITH PROPOFOL;  Surgeon: Eloise Harman, DO;  [facility];  Service: Endoscopy;  Laterality: N/A;   ESOPHAGOGASTRODUODENOSCOPY (EGD) WITH PROPOFOL N/A 12/21/2020   Procedure: ESOPHAGOGASTRODUODENOSCOPY (EGD) WITH PROPOFOL;  Surgeon: Thornton Park, MD;  Location: [facility];  Service: Gastroenterology;  Laterality: N/A;   EYE SURGERY Left 03-30-13   Cataract   EYE SURGERY Right 04-09-13   Cataract   RECTOCELE REPAIR     THYROIDECTOMY     TONSILLECTOMY     TOTAL ABDOMINAL HYSTERECTOMY      Social History   Socioeconomic History   Marital status: Married    Spouse name: Not on file   Number of children: 4   Years of education: Not on file   Highest education level: Not on file  Occupational History   Occupation: Retired Optician, dispensing: RETIRED  Tobacco Use   Smoking status: Never   Smokeless tobacco: Never  Vaping Use   Vaping Use: Never used  Substance and Sexual Activity   Alcohol use:  No   Drug use: No   Sexual activity: Not on file  Other Topics Concern   Not on file  Social History Narrative   Not on file   Social Determinants of Health   Financial Resource Strain: Not on file  Food Insecurity: Not on file  Transportation Needs: Not on file  Physical Activity: Not on file  Stress: Not on file  Social Connections: Not on file  Intimate Partner Violence: Not on file   Family History  Problem Relation Age of Onset   Sarcoidosis Mother    Diabetes Mother    Cancer Mother        sarcoma -  Right arm amputation   Hypertension Mother    Lung cancer Father    Diabetes Father    Heart disease Father        Before age 11   Urolithiasis Father    Cancer Father        Lung   Hyperlipidemia Father    Hypertension Father    Esophageal cancer Maternal Uncle    Ovarian cancer Maternal Aunt    Colon cancer Maternal Uncle    Lung cancer Maternal Uncle    Diabetes Maternal Uncle    Diabetes Maternal Aunt    Colon polyps Maternal Uncle    Heart disease Paternal Grandmother       VITAL SIGNS BP 134/74   Pulse 74   Temp (!) 97.3 F (36.3 C)   Resp 20   Ht '5\' 6"'$  (1.676 m)   Wt 158 lb 3.2 oz (71.8 kg)   SpO2 100%   BMI 25.53 kg/m   Outpatient Encounter Medications as of 06/01/2022  Medication Sig   ASPIRIN LOW DOSE 81 MG tablet Take 81 mg by mouth every morning.   DULoxetine (CYMBALTA) 60 MG capsule Take 60 mg by mouth daily.   EPINEPHrine 0.3 mg/0.3 mL IJ SOAJ injection Inject 0.3 mg into the muscle daily as needed for anaphylaxis.   furosemide (LASIX) 20 MG tablet Take 20 mg by mouth daily.   Insulin Glargine-Lixisenatide (SOLIQUA) 100-33 UNT-MCG/ML SOPN Inject 20 Units into the skin daily. Sliding Scale   JARDIANCE 25 MG TABS tablet Take 25 mg by mouth daily.   ketoconazole (NIZORAL) 2 % cream Apply 1 Application topically 2 (two) times daily. Apply to fungal rash perineal area   levothyroxine (SYNTHROID) 150 MCG tablet Take 1 tablet (150 mcg total)  by mouth daily at 6 (six) AM.   linaclotide (LINZESS) 290 MCG CAPS capsule Take 1 capsule (290 mcg total) by mouth daily before breakfast.   Melatonin 10 MG TABS Take 10 mg by mouth at bedtime as needed (sleep).   midodrine (PROAMATINE)  2.5 MG tablet Take 1 tablet (2.5 mg total) by mouth 3 (three) times daily with meals.   potassium chloride SA (KLOR-CON M) 20 MEQ tablet Take 40 mEq by mouth 2 (two) times daily.   [DISCONTINUED] Insulin Glargine-Lixisenatide 100-33 UNT-MCG/ML SOPN Inject 20-25 Units into the skin daily. Pt adjust between 20-25 units   No facility-administered encounter medications on file as of 06/01/2022.     SIGNIFICANT DIAGNOSTIC EXAMS       ASSESSMENT/ PLAN:     Ok Edwards NP [facility]  Contact [phone] Monday through Friday 8am- 5pm  After hours call [phone]

## 2022-06-21 ENCOUNTER — Other Ambulatory Visit: Payer: Self-pay | Admitting: Adult Health

## 2022-06-27 ENCOUNTER — Other Ambulatory Visit: Payer: Self-pay | Admitting: Adult Health

## 2022-07-17 ENCOUNTER — Other Ambulatory Visit: Payer: Self-pay | Admitting: Adult Health

## 2022-08-28 ENCOUNTER — Other Ambulatory Visit: Payer: Self-pay | Admitting: Adult Health

## 2022-09-13 ENCOUNTER — Other Ambulatory Visit: Payer: Self-pay

## 2022-09-13 ENCOUNTER — Emergency Department (HOSPITAL_COMMUNITY): Payer: Medicare HMO

## 2022-09-13 ENCOUNTER — Inpatient Hospital Stay (HOSPITAL_COMMUNITY)
Admission: EM | Admit: 2022-09-13 | Discharge: 2022-09-16 | DRG: 864 | Disposition: A | Discharge status: Home / Self Care | Payer: Medicare HMO | Attending: Family Medicine | Admitting: Family Medicine

## 2022-09-13 ENCOUNTER — Encounter (HOSPITAL_COMMUNITY): Payer: Self-pay | Admitting: Emergency Medicine

## 2022-09-13 DIAGNOSIS — Z794 Long term (current) use of insulin: Secondary | ICD-10-CM

## 2022-09-13 DIAGNOSIS — Z885 Allergy status to narcotic agent status: Secondary | ICD-10-CM

## 2022-09-13 DIAGNOSIS — Z7984 Long term (current) use of oral hypoglycemic drugs: Secondary | ICD-10-CM

## 2022-09-13 DIAGNOSIS — M797 Fibromyalgia: Secondary | ICD-10-CM | POA: Diagnosis present

## 2022-09-13 DIAGNOSIS — G9349 Other encephalopathy: Secondary | ICD-10-CM | POA: Diagnosis not present

## 2022-09-13 DIAGNOSIS — R6510 Systemic inflammatory response syndrome (SIRS) of non-infectious origin without acute organ dysfunction: Secondary | ICD-10-CM | POA: Diagnosis not present

## 2022-09-13 DIAGNOSIS — G4700 Insomnia, unspecified: Secondary | ICD-10-CM | POA: Diagnosis present

## 2022-09-13 DIAGNOSIS — Z8041 Family history of malignant neoplasm of ovary: Secondary | ICD-10-CM

## 2022-09-13 DIAGNOSIS — F329 Major depressive disorder, single episode, unspecified: Secondary | ICD-10-CM | POA: Diagnosis present

## 2022-09-13 DIAGNOSIS — E039 Hypothyroidism, unspecified: Secondary | ICD-10-CM | POA: Diagnosis not present

## 2022-09-13 DIAGNOSIS — E876 Hypokalemia: Secondary | ICD-10-CM | POA: Diagnosis present

## 2022-09-13 DIAGNOSIS — F419 Anxiety disorder, unspecified: Secondary | ICD-10-CM | POA: Diagnosis present

## 2022-09-13 DIAGNOSIS — E1165 Type 2 diabetes mellitus with hyperglycemia: Secondary | ICD-10-CM | POA: Diagnosis present

## 2022-09-13 DIAGNOSIS — E871 Hypo-osmolality and hyponatremia: Secondary | ICD-10-CM | POA: Diagnosis present

## 2022-09-13 DIAGNOSIS — K5909 Other constipation: Secondary | ICD-10-CM | POA: Diagnosis present

## 2022-09-13 DIAGNOSIS — I25119 Atherosclerotic heart disease of native coronary artery with unspecified angina pectoris: Secondary | ICD-10-CM

## 2022-09-13 DIAGNOSIS — K219 Gastro-esophageal reflux disease without esophagitis: Secondary | ICD-10-CM | POA: Diagnosis present

## 2022-09-13 DIAGNOSIS — Z8249 Family history of ischemic heart disease and other diseases of the circulatory system: Secondary | ICD-10-CM

## 2022-09-13 DIAGNOSIS — I959 Hypotension, unspecified: Secondary | ICD-10-CM | POA: Diagnosis present

## 2022-09-13 DIAGNOSIS — E872 Acidosis, unspecified: Secondary | ICD-10-CM | POA: Diagnosis present

## 2022-09-13 DIAGNOSIS — D72829 Elevated white blood cell count, unspecified: Secondary | ICD-10-CM | POA: Diagnosis present

## 2022-09-13 DIAGNOSIS — Z87442 Personal history of urinary calculi: Secondary | ICD-10-CM

## 2022-09-13 DIAGNOSIS — K581 Irritable bowel syndrome with constipation: Secondary | ICD-10-CM | POA: Diagnosis present

## 2022-09-13 DIAGNOSIS — I2510 Atherosclerotic heart disease of native coronary artery without angina pectoris: Secondary | ICD-10-CM | POA: Diagnosis present

## 2022-09-13 DIAGNOSIS — Z83719 Family history of colon polyps, unspecified: Secondary | ICD-10-CM

## 2022-09-13 DIAGNOSIS — R45851 Suicidal ideations: Secondary | ICD-10-CM | POA: Diagnosis present

## 2022-09-13 DIAGNOSIS — G9341 Metabolic encephalopathy: Secondary | ICD-10-CM | POA: Diagnosis present

## 2022-09-13 DIAGNOSIS — J9811 Atelectasis: Secondary | ICD-10-CM | POA: Diagnosis present

## 2022-09-13 DIAGNOSIS — Z881 Allergy status to other antibiotic agents status: Secondary | ICD-10-CM

## 2022-09-13 DIAGNOSIS — I6529 Occlusion and stenosis of unspecified carotid artery: Secondary | ICD-10-CM | POA: Diagnosis present

## 2022-09-13 DIAGNOSIS — Z7982 Long term (current) use of aspirin: Secondary | ICD-10-CM

## 2022-09-13 DIAGNOSIS — G4730 Sleep apnea, unspecified: Secondary | ICD-10-CM | POA: Diagnosis present

## 2022-09-13 DIAGNOSIS — R079 Chest pain, unspecified: Secondary | ICD-10-CM | POA: Diagnosis not present

## 2022-09-13 DIAGNOSIS — Z800 Family history of malignant neoplasm of digestive organs: Secondary | ICD-10-CM

## 2022-09-13 DIAGNOSIS — E878 Other disorders of electrolyte and fluid balance, not elsewhere classified: Secondary | ICD-10-CM | POA: Diagnosis not present

## 2022-09-13 DIAGNOSIS — Z79899 Other long term (current) drug therapy: Secondary | ICD-10-CM

## 2022-09-13 DIAGNOSIS — H9190 Unspecified hearing loss, unspecified ear: Secondary | ICD-10-CM | POA: Diagnosis present

## 2022-09-13 DIAGNOSIS — I10 Essential (primary) hypertension: Secondary | ICD-10-CM | POA: Diagnosis not present

## 2022-09-13 DIAGNOSIS — Z981 Arthrodesis status: Secondary | ICD-10-CM

## 2022-09-13 DIAGNOSIS — E785 Hyperlipidemia, unspecified: Secondary | ICD-10-CM | POA: Diagnosis present

## 2022-09-13 DIAGNOSIS — Z882 Allergy status to sulfonamides status: Secondary | ICD-10-CM

## 2022-09-13 DIAGNOSIS — Z8701 Personal history of pneumonia (recurrent): Secondary | ICD-10-CM

## 2022-09-13 DIAGNOSIS — Z833 Family history of diabetes mellitus: Secondary | ICD-10-CM

## 2022-09-13 DIAGNOSIS — Z9049 Acquired absence of other specified parts of digestive tract: Secondary | ICD-10-CM

## 2022-09-13 DIAGNOSIS — E890 Postprocedural hypothyroidism: Secondary | ICD-10-CM | POA: Diagnosis present

## 2022-09-13 DIAGNOSIS — Z79890 Hormone replacement therapy (postmenopausal): Secondary | ICD-10-CM

## 2022-09-13 DIAGNOSIS — E119 Type 2 diabetes mellitus without complications: Secondary | ICD-10-CM | POA: Diagnosis not present

## 2022-09-13 DIAGNOSIS — Z83438 Family history of other disorder of lipoprotein metabolism and other lipidemia: Secondary | ICD-10-CM

## 2022-09-13 DIAGNOSIS — R531 Weakness: Principal | ICD-10-CM

## 2022-09-13 DIAGNOSIS — Z880 Allergy status to penicillin: Secondary | ICD-10-CM

## 2022-09-13 DIAGNOSIS — Z801 Family history of malignant neoplasm of trachea, bronchus and lung: Secondary | ICD-10-CM

## 2022-09-13 LAB — COMPREHENSIVE METABOLIC PANEL
ALT: 13 U/L (ref 0–44)
ALT: 19 U/L (ref 0–44)
AST: 21 U/L (ref 15–41)
AST: 28 U/L (ref 15–41)
Albumin: 3.4 g/dL — ABNORMAL LOW (ref 3.5–5.0)
Albumin: 3.3 g/dL — ABNORMAL LOW (ref 3.5–5.0)
Alkaline Phosphatase: 56 U/L (ref 38–126)
Alkaline Phosphatase: 57 U/L (ref 38–126)
Anion gap: 11 (ref 5–15)
Anion gap: 9 (ref 5–15)
BUN: 15 mg/dL (ref 8–23)
BUN: 15 mg/dL (ref 8–23)
CO2: 25 mmol/L (ref 22–32)
CO2: 23 mmol/L (ref 22–32)
Calcium: 8.5 mg/dL — ABNORMAL LOW (ref 8.9–10.3)
Calcium: 8.2 mg/dL — ABNORMAL LOW (ref 8.9–10.3)
Chloride: 95 mmol/L — ABNORMAL LOW (ref 98–111)
Chloride: 103 mmol/L (ref 98–111)
Creatinine, Ser: 1.07 mg/dL — ABNORMAL HIGH (ref 0.44–1.00)
Creatinine, Ser: 0.84 mg/dL (ref 0.44–1.00)
GFR, Estimated: 53 mL/min — ABNORMAL LOW (ref >60)
GFR, Estimated: >60 mL/min (ref >60)
Glucose, Bld: 314 mg/dL — ABNORMAL HIGH (ref 70–99)
Glucose, Bld: 192 mg/dL — ABNORMAL HIGH (ref 70–99)
Potassium: 3.1 mmol/L — ABNORMAL LOW (ref 3.5–5.1)
Potassium: 4.4 mmol/L (ref 3.5–5.1)
Sodium: 131 mmol/L — ABNORMAL LOW (ref 135–145)
Sodium: 135 mmol/L (ref 135–145)
Total Bilirubin: 1 mg/dL (ref 0.3–1.2)
Total Bilirubin: 1 mg/dL (ref 0.3–1.2)
Total Protein: 6.4 g/dL — ABNORMAL LOW (ref 6.5–8.1)
Total Protein: 6.5 g/dL (ref 6.5–8.1)

## 2022-09-13 LAB — CBC WITH DIFFERENTIAL/PLATELET
Abs Immature Granulocytes: 0.09 10*3/uL — ABNORMAL HIGH (ref 0.00–0.07)
Basophils Absolute: 0.1 10*3/uL (ref 0.0–0.1)
Basophils Relative: 1 %
Eosinophils Absolute: 0.5 10*3/uL (ref 0.0–0.5)
Eosinophils Relative: 3 %
HCT: 32.5 % — ABNORMAL LOW (ref 36.0–46.0)
Hemoglobin: 9.5 g/dL — ABNORMAL LOW (ref 12.0–15.0)
Immature Granulocytes: 1 %
Lymphocytes Relative: 17 %
Lymphs Abs: 2.5 10*3/uL (ref 0.7–4.0)
MCH: 21.6 pg — ABNORMAL LOW (ref 26.0–34.0)
MCHC: 29.2 g/dL — ABNORMAL LOW (ref 30.0–36.0)
MCV: 73.9 fL — ABNORMAL LOW (ref 80.0–100.0)
Monocytes Absolute: 1.9 10*3/uL — ABNORMAL HIGH (ref 0.1–1.0)
Monocytes Relative: 13 %
Neutro Abs: 9.7 10*3/uL — ABNORMAL HIGH (ref 1.7–7.7)
Neutrophils Relative %: 65 %
Platelets: 404 10*3/uL — ABNORMAL HIGH (ref 150–400)
RBC: 4.4 MIL/uL (ref 3.87–5.11)
RDW: 17.9 % — ABNORMAL HIGH (ref 11.5–15.5)
WBC: 14.8 10*3/uL — ABNORMAL HIGH (ref 4.0–10.5)
nRBC: 0 % (ref 0.0–0.2)

## 2022-09-13 LAB — PROCALCITONIN: Procalcitonin: <0.1 ng/mL

## 2022-09-13 LAB — URINALYSIS, ROUTINE W REFLEX MICROSCOPIC
Bacteria, UA: NONE SEEN
Bilirubin Urine: NEGATIVE
Glucose, UA: >=500 mg/dL — AB
Hgb urine dipstick: NEGATIVE
Ketones, ur: NEGATIVE mg/dL
Leukocytes,Ua: NEGATIVE
Nitrite: NEGATIVE
Protein, ur: NEGATIVE mg/dL
Specific Gravity, Urine: 1.011 (ref 1.005–1.030)
pH: 6 (ref 5.0–8.0)

## 2022-09-13 LAB — GLUCOSE, CAPILLARY
Glucose-Capillary: 149 mg/dL — ABNORMAL HIGH (ref 70–99)
Glucose-Capillary: 256 mg/dL — ABNORMAL HIGH (ref 70–99)

## 2022-09-13 LAB — LACTIC ACID, PLASMA
Lactic Acid, Venous: 4.5 mmol/L (ref 0.5–1.9)
Lactic Acid, Venous: 3.8 mmol/L (ref 0.5–1.9)
Lactic Acid, Venous: 1.9 mmol/L (ref 0.5–1.9)
Lactic Acid, Venous: 1.8 mmol/L (ref 0.5–1.9)

## 2022-09-13 LAB — TROPONIN I (HIGH SENSITIVITY)
Troponin I (High Sensitivity): 3 ng/L (ref <18)
Troponin I (High Sensitivity): 3 ng/L (ref <18)

## 2022-09-13 LAB — TSH: TSH: 0.565 u[IU]/mL (ref 0.350–4.500)

## 2022-09-13 LAB — CORTISOL: Cortisol, Plasma: 28 ug/dL

## 2022-09-13 LAB — PROTIME-INR
INR: 1.2 (ref 0.8–1.2)
Prothrombin Time: 14.6 seconds (ref 11.4–15.2)

## 2022-09-13 LAB — CBG MONITORING, ED: Glucose-Capillary: 293 mg/dL — ABNORMAL HIGH (ref 70–99)

## 2022-09-13 LAB — APTT: aPTT: 31 seconds (ref 24–36)

## 2022-09-13 MED ORDER — ONDANSETRON HCL 4 MG PO TABS: 4.0000 mg | ORAL_TABLET | Freq: Four times a day (QID) | ORAL | Status: DC | PRN

## 2022-09-13 MED ORDER — SODIUM CHLORIDE 0.9 % IV SOLN: INTRAVENOUS | Status: DC

## 2022-09-13 MED ORDER — ASPIRIN 81 MG PO TBEC
81.0000 mg | DELAYED_RELEASE_TABLET | Freq: Every morning | ORAL | Status: DC
  Administered 2022-09-13 – 2022-09-16 (×4): 81 mg via ORAL
  Filled 2022-09-13 (×4): qty 1

## 2022-09-13 MED ORDER — LACTATED RINGERS IV BOLUS
1000.0000 mL | Freq: Once | INTRAVENOUS | Status: AC
  Administered 2022-09-13: 1000 mL via INTRAVENOUS

## 2022-09-13 MED ORDER — SODIUM CHLORIDE 0.9 % IV SOLN
2.0000 g | Freq: Two times a day (BID) | INTRAVENOUS | Status: DC
  Administered 2022-09-13 – 2022-09-16 (×6): 2 g via INTRAVENOUS
  Filled 2022-09-13 (×6): qty 12.5

## 2022-09-13 MED ORDER — VANCOMYCIN HCL IN DEXTROSE 1-5 GM/200ML-% IV SOLN
1000.0000 mg | Freq: Once | INTRAVENOUS | Status: AC
  Administered 2022-09-13: 1000 mg via INTRAVENOUS
  Filled 2022-09-13: qty 200

## 2022-09-13 MED ORDER — INSULIN ASPART 100 UNIT/ML IJ SOLN
0.0000 [IU] | Freq: Every day | INTRAMUSCULAR | Status: DC
  Administered 2022-09-13: 3 [IU] via SUBCUTANEOUS
  Administered 2022-09-14: 2 [IU] via SUBCUTANEOUS

## 2022-09-13 MED ORDER — LEVOTHYROXINE SODIUM 75 MCG PO TABS
150.0000 ug | ORAL_TABLET | Freq: Every day | ORAL | Status: DC
  Administered 2022-09-14 – 2022-09-16 (×3): 150 ug via ORAL
  Filled 2022-09-13 (×3): qty 2

## 2022-09-13 MED ORDER — SODIUM CHLORIDE 0.9 % IV BOLUS (SEPSIS)
1000.0000 mL | Freq: Once | INTRAVENOUS | Status: AC
  Administered 2022-09-13: 1000 mL via INTRAVENOUS

## 2022-09-13 MED ORDER — ATORVASTATIN CALCIUM 20 MG PO TABS
20.0000 mg | ORAL_TABLET | Freq: Every day | ORAL | Status: DC
  Administered 2022-09-13 – 2022-09-16 (×4): 20 mg via ORAL
  Filled 2022-09-13 (×4): qty 1

## 2022-09-13 MED ORDER — SODIUM CHLORIDE 0.9 % IV BOLUS
1000.0000 mL | Freq: Once | INTRAVENOUS | Status: AC
  Administered 2022-09-13: 1000 mL via INTRAVENOUS

## 2022-09-13 MED ORDER — INSULIN DETEMIR 100 UNIT/ML ~~LOC~~ SOLN
15.0000 [IU] | Freq: Every day | SUBCUTANEOUS | Status: DC
  Administered 2022-09-13 – 2022-09-15 (×3): 15 [IU] via SUBCUTANEOUS
  Filled 2022-09-13 (×4): qty 0.15

## 2022-09-13 MED ORDER — ALPRAZOLAM 0.5 MG PO TABS: 0.5000 mg | ORAL_TABLET | Freq: Three times a day (TID) | ORAL | Status: DC | PRN

## 2022-09-13 MED ORDER — NOREPINEPHRINE 4 MG/250ML-% IV SOLN
0.0000 ug/min | INTRAVENOUS | Status: DC
  Administered 2022-09-13: 2 ug/min via INTRAVENOUS
  Filled 2022-09-13: qty 250

## 2022-09-13 MED ORDER — METOPROLOL SUCCINATE ER 50 MG PO TB24
50.0000 mg | ORAL_TABLET | Freq: Every day | ORAL | Status: DC
  Administered 2022-09-13 – 2022-09-16 (×3): 50 mg via ORAL
  Filled 2022-09-13 (×4): qty 1

## 2022-09-13 MED ORDER — VANCOMYCIN HCL 1250 MG/250ML IV SOLN
1250.0000 mg | INTRAVENOUS | Status: DC
  Administered 2022-09-14: 1250 mg via INTRAVENOUS
  Filled 2022-09-13: qty 250

## 2022-09-13 MED ORDER — SENNOSIDES-DOCUSATE SODIUM 8.6-50 MG PO TABS
2.0000 | ORAL_TABLET | Freq: Two times a day (BID) | ORAL | Status: DC
  Administered 2022-09-13 – 2022-09-16 (×6): 2 via ORAL
  Filled 2022-09-13 (×7): qty 2

## 2022-09-13 MED ORDER — DOXEPIN HCL 10 MG PO CAPS
10.0000 mg | ORAL_CAPSULE | Freq: Every day | ORAL | Status: DC
  Administered 2022-09-13 – 2022-09-15 (×3): 10 mg via ORAL
  Filled 2022-09-13 (×4): qty 1

## 2022-09-13 MED ORDER — POTASSIUM CHLORIDE 20 MEQ PO PACK
40.0000 meq | PACK | Freq: Once | ORAL | Status: AC
  Administered 2022-09-13: 40 meq via ORAL
  Filled 2022-09-13: qty 2

## 2022-09-13 MED ORDER — ACETAMINOPHEN 325 MG PO TABS: 650.0000 mg | ORAL_TABLET | Freq: Four times a day (QID) | ORAL | Status: DC | PRN

## 2022-09-13 MED ORDER — SODIUM CHLORIDE 0.9 % IV BOLUS
500.0000 mL | Freq: Once | INTRAVENOUS | Status: AC
  Administered 2022-09-13: 500 mL via INTRAVENOUS

## 2022-09-13 MED ORDER — MIDODRINE HCL 5 MG PO TABS
10.0000 mg | ORAL_TABLET | Freq: Three times a day (TID) | ORAL | Status: DC
  Administered 2022-09-13: 10 mg via ORAL
  Filled 2022-09-13: qty 2

## 2022-09-13 MED ORDER — DULOXETINE HCL 60 MG PO CPEP
60.0000 mg | ORAL_CAPSULE | Freq: Every day | ORAL | Status: DC
  Administered 2022-09-13 – 2022-09-15 (×3): 60 mg via ORAL
  Filled 2022-09-13 (×3): qty 1

## 2022-09-13 MED ORDER — ONDANSETRON HCL 4 MG/2ML IJ SOLN: 4.0000 mg | Freq: Four times a day (QID) | INTRAMUSCULAR | Status: DC | PRN

## 2022-09-13 MED ORDER — FLUOXETINE HCL 20 MG PO CAPS
40.0000 mg | ORAL_CAPSULE | Freq: Every day | ORAL | Status: DC
  Administered 2022-09-13 – 2022-09-16 (×4): 40 mg via ORAL
  Filled 2022-09-13 (×4): qty 2

## 2022-09-13 MED ORDER — CHLORHEXIDINE GLUCONATE CLOTH 2 % EX PADS
6.0000 | MEDICATED_PAD | Freq: Every day | CUTANEOUS | Status: DC
  Administered 2022-09-14 – 2022-09-15 (×2): 6 via TOPICAL

## 2022-09-13 MED ORDER — HEPARIN SODIUM (PORCINE) 5000 UNIT/ML IJ SOLN
5000.0000 [IU] | Freq: Three times a day (TID) | INTRAMUSCULAR | Status: DC
  Administered 2022-09-13 – 2022-09-16 (×8): 5000 [IU] via SUBCUTANEOUS
  Filled 2022-09-13 (×8): qty 1

## 2022-09-13 MED ORDER — POLYETHYLENE GLYCOL 3350 17 G PO PACK
17.0000 g | PACK | Freq: Every day | ORAL | Status: DC
  Administered 2022-09-14 – 2022-09-15 (×2): 17 g via ORAL
  Filled 2022-09-13 (×3): qty 1

## 2022-09-13 MED ORDER — POTASSIUM CHLORIDE 2 MEQ/ML IV SOLN
INTRAVENOUS | Status: DC
  Filled 2022-09-13 (×6): qty 1000

## 2022-09-13 MED ORDER — POTASSIUM CHLORIDE 2 MEQ/ML IV SOLN
INTRAVENOUS | Status: DC
  Filled 2022-09-13 (×11): qty 1000

## 2022-09-13 MED ORDER — ACETAMINOPHEN 650 MG RE SUPP: 650.0000 mg | Freq: Four times a day (QID) | RECTAL | Status: DC | PRN

## 2022-09-13 MED ORDER — OXYCODONE HCL 5 MG PO TABS: 5.0000 mg | ORAL_TABLET | ORAL | Status: DC | PRN

## 2022-09-13 MED ORDER — INSULIN ASPART 100 UNIT/ML IJ SOLN
0.0000 [IU] | Freq: Three times a day (TID) | INTRAMUSCULAR | Status: DC
  Administered 2022-09-13 – 2022-09-14 (×3): 2 [IU] via SUBCUTANEOUS
  Administered 2022-09-15: 5 [IU] via SUBCUTANEOUS
  Administered 2022-09-15: 2 [IU] via SUBCUTANEOUS
  Administered 2022-09-15 – 2022-09-16 (×2): 5 [IU] via SUBCUTANEOUS
  Administered 2022-09-16: 3 [IU] via SUBCUTANEOUS

## 2022-09-13 MED ORDER — SODIUM CHLORIDE 0.9 % IV SOLN
2.0000 g | Freq: Once | INTRAVENOUS | Status: AC
  Administered 2022-09-13: 2 g via INTRAVENOUS
  Filled 2022-09-13: qty 12.5

## 2022-09-13 NOTE — Progress Notes (Signed)
Pharmacy Antibiotic Note  Jeanne Kim is a 81 y.o. female admitted on 09/13/2022 with  unknown source of infection .  Pharmacy has been consulted for vancomycin and cefepime dosing.  Plan: Vancomycin 1250 mg IV every 24 hours. Cefepime 2000 mg IV every 12 hours. Monitor labs, c/s, and vanco level as indicated.  Height: '5\' 5"'$  (165.1 cm) Weight: 70.8 kg (156 lb 1.4 oz) IBW/kg (Calculated) : 57  Temp (24hrs), Avg:96.7 F (35.9 C), Min:94 F (34.4 C), Max:97.6 F (36.4 C)  Recent Labs  Lab 09/13/22 0029 09/13/22 0218 09/13/22 0820 09/13/22 0823 09/13/22 1039  WBC 14.8*  --   --   --   --   CREATININE 1.07*  --   --  0.84  --   LATICACIDVEN 4.5* 3.8* 1.9  --  1.8    Estimated Creatinine Clearance: 53.6 mL/min (by C-G formula based on SCr of 0.84 mg/dL).    Allergies  Allergen Reactions   Codeine Hives   Sulfonamide Derivatives Hives   Canagliflozin    Penicillins Hives    Pt states she does NOT have allergy to penicillin   Sulfa Antibiotics Other (See Comments)    unknown    Antimicrobials this admission: Vanco 12/7 >> Cefepime 12/7 >>   Microbiology results: 12/7 BCx: pending 12/7 UCx: pending   12/7 MRSA PCR: pending  Thank you for allowing pharmacy to be a part of this patient's care.  Ramond Craver 09/13/2022 5:14 PM

## 2022-09-13 NOTE — Assessment & Plan Note (Addendum)
-  Patient met SIRS criteria on admission with leukocytosis of 14.8, hypotension, lactic acidosis with no endorgan damage - No source of infection has been identified -Sepsis ruled out, still meeting SIRS criteria  Vitals today> Blood pressure 131/63, pulse 65, temperature 97.8 F (36.6 C), temperature source Oral, resp. rate 14, height _0  (1.651 m), weight 72.4 kg, SpO2 96 %.  Lactic acid 3.8 >> 1.8 Procalcitonin-- <0.10  WBC 14.8>> 11.4 - was Covered with vancomycin and cefepime- D/Ced Vanc, continue cefepime - UA is not indicative of UTI - No signs of infection on CT renal study - Chest x-ray shows no low lung volumes with mild atelectasis at the lung bases - Blood cultures >>> no growth to date  - Continue IVF --- reducing rate

## 2022-09-13 NOTE — H&P (Signed)
History and Physical    Patient: Jeanne Kim OIZ:[id number] DOB: 04/18/1941 DOA: 09/13/2022 DOS: the patient was seen and examined on 09/13/2022 PCP: [name], NP  Patient coming from: Home  Chief Complaint:  Chief Complaint  Patient presents with   Hypotension   HPI: Jeanne Kim is a 81 y.o. female with medical history significant of anginal pain, carotid artery plaque, coronary artery disease, depression, GERD, fibromyalgia, hyperlipidemia, hypertension, hypothyroidism, sleep apnea, type 2 diabetes mellitus, and more presents the ED with a chief complaint of "I am dying."  Patient seems confused but has not recently received any medications that should cause her to be confused.  She reports that she is here because she was dying.  After reassurance, she changes her story to chest pain.  She reports that she was told her heart muscle is giving out, which does not appear to have been discussed during this hospitalization so far.  According to chart review patient's husband called EMS because patient was hyperglycemic and lethargic.  Patient had GCS of 14 at presentation, and she had reported to the nurse that she was had slurred speech and felt confused all day.  I cannot get any of this history out of her right now.  Patient was discharged in August after similar presentation.  She did had generalized weakness for a week at that time and decreased oral intake with decreased urine output at that time.  She was noted to be a poor historian at that time as well due to cognitive impairment and acute encephalopathy.  Initially she was treated for sepsis, but it was ruled out.  Her blood pressure remained soft and she was started on midodrine.  Patient was discharged home without any antihypertensives and with a plan to wean off midodrine.  Admission requested for hypotension.   Review of Systems: unable to review all systems due to the inability of the patient to answer questions. Past  Medical History:  Diagnosis Date   Allergy    Anemia    Anginal pain ([place])    Anxiety    Arthritis    Blood transfusion without reported diagnosis    Breast cancer ([place])    Carotid artery plaque    bilat   Cataract    Colon polyps    adenomatous   Concussion 02/2016   Driving restrictions for 6w   Coronary heart disease    Depression    Diverticulosis    Dizziness    Esophageal reflux    Esophageal stricture    Fibromyalgia    Gallstones    Hiatal hernia    Hypercholesterolemia    Hypertension    Hypothyroidism    IBS (irritable bowel syndrome)    Kidney stones    Osteoporosis    Pneumonia    recently, just finished ABO   PONV (postoperative nausea and vomiting)    Sleep apnea    CPAP   Thyroid disease 2002   Type II or unspecified type diabetes mellitus without mention of complication, not stated as uncontrolled    Unspecified gastritis and gastroduodenitis without mention of hemorrhage    Past Surgical History:  Procedure Laterality Date   ANAL RECTAL MANOMETRY N/A 08/05/2015   Procedure: ANO RECTAL MANOMETRY;  Surgeon: Jeanne Kim;  [facility]: [facility];  Service: Endoscopy;  Laterality: N/A;   APPENDECTOMY     BIOPSY  08/06/2020   Procedure: BIOPSY;  Surgeon: Jeanne Kim;  [facility];  Service: Endoscopy;;   BIOPSY  12/21/2020   Procedure: BIOPSY;  Surgeon: Jeanne Kim;  [facility]: [facility];  Service: Gastroenterology;;   BIOPSY  12/23/2020   Procedure: BIOPSY;  Surgeon: Jeanne Kim;  [facility];  Service: Gastroenterology;;   BREAST LUMPECTOMY     left   CARDIAC CATHETERIZATION     CATARACT EXTRACTION W/PHACO Left 03/30/2013   Procedure: CATARACT EXTRACTION PHACO AND INTRAOCULAR LENS PLACEMENT ([place]);  Surgeon: Jeanne Kim;  Location: [facility];  Service: Ophthalmology;  Laterality: Left;  CDE:19.28   CATARACT EXTRACTION W/PHACO Right 04/09/2013   Procedure: CATARACT EXTRACTION PHACO AND  INTRAOCULAR LENS PLACEMENT (IOC);  Surgeon: Jeanne Kim;  Location: [facility];  Service: Ophthalmology;  Laterality: Right;  CDE: 15.44   CERVICAL FUSION     CESAREAN SECTION     CHOLECYSTECTOMY     COLONOSCOPY WITH PROPOFOL N/A 12/23/2020   Procedure: COLONOSCOPY WITH PROPOFOL;  Surgeon: Jeanne Kim;  [facility];  Service: Gastroenterology;  Laterality: N/A;   CYSTOCELE REPAIR     DILATION AND CURETTAGE OF UTERUS     ESOPHAGOGASTRODUODENOSCOPY (EGD) WITH PROPOFOL N/A 08/06/2020   Procedure: ESOPHAGOGASTRODUODENOSCOPY (EGD) WITH PROPOFOL;  Surgeon: Jeanne Kim;  [facility];  Service: Endoscopy;  Laterality: N/A;   ESOPHAGOGASTRODUODENOSCOPY (EGD) WITH PROPOFOL N/A 12/21/2020   Procedure: ESOPHAGOGASTRODUODENOSCOPY (EGD) WITH PROPOFOL;  Surgeon: Jeanne Kim;  [facility];  Service: Gastroenterology;  Laterality: N/A;   EYE SURGERY Left 03-30-13   Cataract   EYE SURGERY Right 04-09-13   Cataract   RECTOCELE REPAIR     THYROIDECTOMY     TONSILLECTOMY     TOTAL ABDOMINAL HYSTERECTOMY     Social History:  reports that she has never smoked. She has never used smokeless tobacco. She reports that she does not drink alcohol and does not use drugs.  Allergies  Allergen Reactions   Codeine Hives   Sulfonamide Derivatives Hives   Canagliflozin    Penicillins Hives    Pt states she does NOT have allergy to penicillin   Sulfa Antibiotics Other (See Comments)    unknown    Family History  Problem Relation Age of Onset   Sarcoidosis Mother    Diabetes Mother    Cancer Mother        sarcoma -  Right arm amputation   Hypertension Mother    Lung cancer Father    Diabetes Father    Heart disease Father        Before age 38   Urolithiasis Father    Cancer Father        Lung   Hyperlipidemia Father    Hypertension Father    Esophageal cancer Maternal Uncle    Ovarian cancer Maternal Aunt    Colon cancer Maternal Uncle    Lung  cancer Maternal Uncle    Diabetes Maternal Uncle    Diabetes Maternal Aunt    Colon polyps Maternal Uncle    Heart disease Paternal Grandmother     Prior to Admission medications   Medication Sig Start Date End Date Taking? Authorizing Provider  ASPIRIN LOW DOSE 81 MG tablet Take 81 mg by mouth every morning. 04/17/22   Provider, Historical, Kim  DULoxetine (CYMBALTA) 60 MG capsule Take 1 capsule (60 mg total) by mouth daily. 06/01/22   [name], NP  EPINEPHrine 0.3 mg/0.3 mL IJ SOAJ injection Inject 0.3 mg into the muscle daily as needed for anaphylaxis. 07/18/09  Provider, Historical, Kim  furosemide (LASIX) 20 MG tablet Take 1 tablet (20 mg total) by mouth daily. 06/01/22   [name], NP  Insulin Glargine-Lixisenatide (SOLIQUA) 100-33 UNT-MCG/ML SOPN Inject 20 Units into the skin daily. Sliding Scale 06/01/22   [name], NP  JARDIANCE 25 MG TABS tablet Take 1 tablet (25 mg total) by mouth daily. 06/01/22   [name], NP  ketoconazole (NIZORAL) 2 % cream Apply 1 Application topically 2 (two) times daily. Apply to fungal rash perineal area 06/01/22   [name], NP  levothyroxine (SYNTHROID) 150 MCG tablet Take 1 tablet (150 mcg total) by mouth daily at 6 (six) AM. 06/01/22   Nyoka Cowden, Phylis Bougie, NP  linaclotide Rolan Lipa) 290 MCG CAPS capsule Take 1 capsule (290 mcg total) by mouth daily before breakfast. 06/01/22   [name], NP  Melatonin 10 MG TABS Take 10 mg by mouth at bedtime as needed (sleep).    Provider, Historical, Kim  potassium chloride SA (KLOR-CON M) 20 MEQ tablet Take 2 tablets (40 mEq total) by mouth 2 (two) times daily. 06/01/22   [name], NP    Physical Exam: Vitals:   09/13/22 0430 09/13/22 0445 09/13/22 0515 09/13/22 0530  BP: 98/65 (!) 84/53 (!) 90/51 (!) 82/47  Pulse: 60 62 60 (!) 58  Resp: 18 (!) '21 14 19  '$ Temp:      TempSrc:      SpO2: 100% 91% 97% 96%  Weight:      Height:       1.  General: Patient lying supine in  bed, chronically ill-appearing, pale  2. Psychiatric: Alert and oriented x person and place, mood and behavior normal for situation, pleasant and cooperative with exam   3. Neurologic: Speech is slow and language is normal, face is symmetric, moves all 4 extremities voluntarily, at baseline without acute deficits on limited exam   4. HEENMT:  Head is atraumatic, normocephalic, pupils reactive to light, neck is supple, trachea is midline, mucous membranes are moist   5. Respiratory : Lungs are clear to auscultation bilaterally without wheezing, rhonchi, rales, no cyanosis, no increase in work of breathing or accessory muscle use   6. Cardiovascular : Heart rate normal, rhythm is regular, no murmurs, rubs or gallops, no peripheral edema, peripheral pulses palpated   7. Gastrointestinal:  Abdomen is soft, mildly distended, nontender to palpation bowel sounds active, no masses or organomegaly palpated   8. Skin:  Skin is warm, dry and intact without rashes, acute lesions, or ulcers on limited exam   9.Musculoskeletal:  No acute deformities or trauma, no asymmetry in tone, no peripheral edema, peripheral pulses palpated, no tenderness to palpation in the extremities  Data Reviewed: In the ED Temp 94, heart rate 49-83, respiratory rate 13-21, blood pressure 59/33-98/65, satting 96% Leukocytosis at 14.8, hemoglobin 9.5 Hyponatremia 131, hypokalemia 3.1, hyperglycemia 314 Albumin 3.4 Lactic acidosis 4.5, 3.8 Tropes 3, 3 UA is not indicative of UTI Blood culture pending CT renal stone study shows constipation Chest x-ray shows low lung volumes and mild atelectasis at the lung bases Patient was started on vancomycin and cefepime for empiric treatment of possible sepsis Normal saline 2-1/2 L bolus given EKG shows a heart rate of 50, sinus rhythm, QTc 479 Admission requested for hypotension  Assessment and Plan: * SIRS (systemic inflammatory response syndrome) (HCC) - Leukocytosis of  14.8 and hypotension of 59/33 - Endorgan damage with lactic acidosis - No suspected source of infection -  Covered with vancomycin and cefepime - UA is not indicative of UTI - No signs of infection on CT renal study - Chest x-ray shows no low lung volumes with mild atelectasis at the lung bases - Blood cultures pending - Continue to trend lactic acid - Continue  Acute metabolic encephalopathy - Secondary to hypotension? - Consistent with previous presentation - No focal deficits on exam - Continue to monitor  Chronic constipation - Again demonstrated on CT - Start MiraLAX  Hypokalemia - Potassium 3.1 - Replace and recheck  Electrolyte abnormality - With hypokalemia and hyponatremia as well as hypochloremia - Replace potassium and recheck in the a.m. - Patient is already being given 2-1/2 L of normal saline, continue normal saline at 125/h - Hold Lasix - Will consider holding Cymbalta if electrolytes Kim not improve - With hyponatremia and hypotension there is concern for adrenal insufficiency, although the potassium is not consistent with this picture.  Will check cortisol and aldosterone - Continue to monitor  Uncontrolled type 2 diabetes mellitus with hyperglycemia, with long-term current use of insulin (HCC) - Patient is on 20 units of basal insulin at home - Continue reduced dose of basal insulin - Continue sliding scale coverage - Hemoglobin A1c pending Continue to monitor  Chest pain - Patient is a poor historian so it is difficult to determine if this was typical chest pain or not - Troponin 3, 3 - EKG without acute ST segment changes - Monitor on telemetry  CAD (coronary artery disease) - Continue aspirin - No chest pain - EKG is without acute ST changes - Monitor on telemetry  Essential hypertension - Currently hypotensive - Start midodrine - No antihypertensives - Admit to ICU for possible pressor treatment  Hypothyroidism - Continue Synthroid -  Check TSH      Advance Care Planning:   Code Status: Prior   Consults: None  Family Communication: No family at bedside  Severity of Illness: The appropriate patient status for this patient is INPATIENT. Inpatient status is judged to be reasonable and necessary in order to provide the required intensity of service to ensure the patient's safety. The patient's presenting symptoms, physical exam findings, and initial radiographic and laboratory data in the context of their chronic comorbidities is felt to place them at high risk for further clinical deterioration. Furthermore, it is not anticipated that the patient will be medically stable for discharge from the hospital within 2 midnights of admission.   * I certify that at the point of admission it is my clinical judgment that the patient will require inpatient hospital care spanning beyond 2 midnights from the point of admission due to high intensity of service, high risk for further deterioration and high frequency of surveillance required.*  Author: Rolla Plate, Kim 09/13/2022 6:36 AM  For on call review [email or web address].

## 2022-09-13 NOTE — Assessment & Plan Note (Addendum)
-   Again demonstrated on CT -Reported bowel movement with current regiment - On MiraLAX, added Senokot, and one-time mag citrate today

## 2022-09-13 NOTE — ED Notes (Signed)
CRITICAL VALUE STICKER  CRITICAL VALUE: Lactic Acid 4.5  RECEIVER (on-site recipient of call):  Verlene Mayer RN   DATE & TIME NOTIFIED: 09/13/22  0109  MESSENGER (representative from lab):  Delana Meyer   MD NOTIFIED:  Dr Stark Jock   TIME OF NOTIFICATION:  0109  RESPONSE:

## 2022-09-13 NOTE — Assessment & Plan Note (Addendum)
-   Denies of having chest pain or shortness of breath - EKG is without acute ST changes - Monitor on telemetry  -Heart cath in 2006 showing non-obstructive CAD.  -Stress test in 2017 negative.  -Hx of atypical CP, but no recent use of Nitro.    -Continue home Metoprolol, Norvasc, statin, ASA.

## 2022-09-13 NOTE — Hospital Course (Signed)
Jeanne Kim is a 81 y.o. female with medical history significant of anginal pain, carotid artery plaque, coronary artery disease, depression, GERD, fibromyalgia, hyperlipidemia, hypertension, hypothyroidism, sleep apnea, type 2 diabetes mellitus, and more presents the ED with a chief complaint of "I am dying."  Patient seems confused but has not recently received any medications that should cause her to be confused.  She reports that she is here because she was dying.  After reassurance, she changes her story to chest pain.  She reports that her heart muscle is giving out, which does not appear to have been discussed during this hospitalization so far.  According to chart review patient's husband called EMS because patient was hypoglycemic and lethargic.  Patient had GCS of 14 at presentation, and she had reported to the nurse that she was had slurred speech and felt confused all day.     Patient was discharged in August after similar presentation.  She did had generalized weakness for a week at that time and decreased oral intake with decreased urine output at that time.  She was noted to be a poor historian at that time as well due to cognitive impairment and acute encephalopathy.  Initially she was treated for sepsis, but it was ruled out. Her blood pressure remained soft and she was started on midodrine.  Patient was discharged home without any antihypertensives and with a plan to wean off midodrine.   ED Temp 94, heart rate 49-83, respiratory rate 13-21, blood pressure 59/33-98/65, satting 96% Leukocytosis at 14.8, hemoglobin 9.5 Hyponatremia 131, hypokalemia 3.1, hyperglycemia 314 Albumin 3.4 Lactic acidosis 4.5, 3.8 Tropes 3, 3 UA is not indicative of UTI Blood culture pending CT renal stone study shows constipation Chest x-ray shows low lung volumes and mild atelectasis at the lung bases Patient was started on vancomycin and cefepime for empiric treatment of possible sepsis Normal saline  2-1/2 L bolus given EKG shows a heart rate of 50, sinus rhythm, QTc 479  Admission requested for hypotension, SIRS.

## 2022-09-13 NOTE — Assessment & Plan Note (Addendum)
-   Patient is on 20 units of basal insulin at home - Continue reduced dose of basal insulin -A1c 10.4 on 05/17/2022 - Continue sliding scale coverage - Hemoglobin A1c  Continue to monitor CBG (last 3)  Recent Labs    09/14/22 1528 09/14/22 2141 09/15/22 0755  GLUCAP 121* 214* 128*

## 2022-09-13 NOTE — TOC Initial Note (Signed)
Transition of Care [facility]) - Initial/Assessment Note    Patient Details  Name: Jeanne Kim MRN: [id number] Date of Birth: 22-Jul-1941  [facility]) CM/SW Contact:    Salome Arnt, [place] Phone Number: 09/13/2022, 1:19 PM  Clinical Narrative:  Assessment completed due to high risk readmission score. Pt reports she lives with her husband and is fairly independent with ADLs at baseline. She ambulates with a walker. Pt's husband provides transport to appointments. Pt states she is not active with any home health services at this time. She plans to return home when medically stable. TOC will continue to follow.    Expected Discharge Plan: Home/Self Care Barriers to Discharge: Continued Medical Work up   Patient Goals and CMS Choice Patient states their goals for this hospitalization and ongoing recovery are:: return home   Choice offered to / list presented to : Patient  Expected Discharge Plan and Services Expected Discharge Plan: Home/Self Care In-house Referral: Clinical Social Work     Living arrangements for the past 2 months: Single Family Home                                      Prior Living Arrangements/Services Living arrangements for the past 2 months: Single Family Home Lives with:: Spouse Patient language and need for interpreter reviewed:: Yes Do you feel safe going back to the place where you live?: Yes          Current home services: DME (cane, walker) Criminal Activity/Legal Involvement Pertinent to Current Situation/Hospitalization: No - Comment as needed  Activities of Daily Living Home Assistive Devices/Equipment: Environmental consultant (specify type), Wheelchair, CBG Meter, Cane (specify quad or straight) ADL Screening (condition at time of admission) Patient's cognitive ability adequate to safely complete daily activities?: Yes Is the patient deaf or have difficulty hearing?: No Does the patient have difficulty seeing, even when wearing  glasses/contacts?: No Does the patient have difficulty concentrating, remembering, or making decisions?: No Patient able to express need for assistance with ADLs?: Yes Does the patient have difficulty dressing or bathing?: No Independently performs ADLs?: Yes (appropriate for developmental age) Does the patient have difficulty walking or climbing stairs?: Yes Weakness of Legs: Both Weakness of Arms/Hands: None  Permission Sought/Granted                  Emotional Assessment     Affect (typically observed): Appropriate Orientation: : Oriented to Self, Oriented to Place, Oriented to  Time, Oriented to Situation Alcohol / Substance Use: Not Applicable Psych Involvement: No (comment)  Admission diagnosis:  SIRS (systemic inflammatory response syndrome) ([place]) [R65.10] Patient Active Problem List   Diagnosis Date Noted   Chronic constipation 05/23/2022   Aortic atherosclerosis ([place]) 05/23/2022   Hypertension associated with type 2 diabetes mellitus ([place]) 05/23/2022   Hypotension 05/23/2022   Cognitive impairment 05/23/2022   SIRS (systemic inflammatory response syndrome) ([place]) 83/15/1761   Acute metabolic encephalopathy 60/73/7106   Generalized weakness 05/04/2021   Hypoalbuminemia due to protein-calorie malnutrition ([place]) 05/04/2021   Hyperlipidemia 05/04/2021   Prolonged QT interval 05/04/2021   Symptomatic anemia 12/23/2020   GI bleeding 12/20/2020   Hypokalemia due to excessive gastrointestinal loss of potassium 10/31/2020   Hyperbilirubinemia 10/31/2020   Thrombocytosis 10/31/2020   Abdominal pain 10/04/2020   Fibromyalgia 10/04/2020   Proctocolitis 10/04/2020   Gastric ulcer 10/04/2020   History of gastrointestinal bleeding 10/04/2020   Intracranial bleed (  [place]) 09/28/2020   AKI (acute kidney injury) ([place]) 09/28/2020   Upper GI bleed 08/05/2020   Hypokalemia 03/25/2020   Hypomagnesemia 03/22/2020   Insomnia 03/22/2020   MDD (major depressive disorder) 03/22/2020    Status post total replacement of left hip 03/21/2020   S/P musculoskeletal system surgery 03/21/2020   At risk for falls 02/24/2020   Chronic, continuous use of opioids 02/24/2020   Senile osteoporosis 02/24/2020   History of radiation therapy 02/24/2020   Leukemoid reaction 02/06/2020   Microcytic anemia 02/05/2020   Electrolyte abnormality 02/05/2020   Bilateral lower extremity edema 02/04/2020   Pathological fracture of left hip due to age-related osteoporosis ([place]) 02/04/2020   History of fracture of left hip 02/03/2020   Malignant neoplasm of breast ([place]) 07/02/2013   Occlusion and stenosis of carotid artery without mention of cerebral infarction 04/28/2013   Carotid stenosis 01/20/2013   Autonomic orthostatic hypotension 10/17/2012   Postural hypotension 10/17/2012   Dyspnea 08/27/2012   Syncope 02/17/2012   Personal history of colonic polyps 12/12/2011   Diverticulosis of colon 12/12/2011   Gastritis 12/12/2011   GERD with stricture 12/12/2011   Gastroesophageal reflux disease 11/27/2011   IBS (irritable bowel syndrome) 11/27/2011   S/P cholecystectomy 11/27/2011   Chest pain 12/09/2009   History of breast cancer 12/09/2009   Hypothyroidism 04/27/2009   OVERWEIGHT/OBESITY 04/07/2009   Essential hypertension 04/07/2009   CAD (coronary artery disease) 04/07/2009   Irritable bowel syndrome 04/07/2009   DEGENERATIVE [place] DISEASE 04/07/2009   Uncontrolled type 2 diabetes mellitus with hyperglycemia, with long-term current use of insulin ([place]) 05/04/2008   Thyroid nodule 05/04/2008   PCP:  [name], NP Pharmacy:   [place], [place] [address] [place] [address] Phone: [phone] Fax: [phone]     Social Determinants of Health (SDOH) Interventions    Readmission Risk Interventions    09/13/2022    1:16 PM 05/16/2022   11:25 AM  Readmission Risk Prevention Plan  Transportation Screening Complete Complete   HRI or Home Care Consult Complete   Social Work Consult for [place] Planning/Counseling Complete   Palliative Care Screening Not Applicable   Medication Review Press photographer) Complete Complete  HRI or [place]  Complete  SW Recovery Care/Counseling Consult  Complete  Palliative Care Screening  Not [place]  Not Applicable

## 2022-09-13 NOTE — Assessment & Plan Note (Addendum)
-   Resolved, repleted p.o.

## 2022-09-13 NOTE — ED Notes (Signed)
Date and time results received: 09/13/22 0251 (use smartphrase ".now" to insert current time)  Test: lactic acid  Critical Value: 3.8  Name of Provider Notified: Dr. Stark Jock   Orders Received? Or Actions Taken?: Orders Received - See Orders for details

## 2022-09-13 NOTE — Progress Notes (Signed)
When writer asked patient if she had any thoughts/feelings of harming herself, patient answered NO.  When writer asked patient if she had any thoughts of wishing she was dead, patient answered YES. [place] RN at bedside and witnessed patient stating this and then patient stated that she shouldn't have said that due to it going to be in her record. Emotional support and reassurance provided. Dr Roger Shelter made aware of patient statement and answering yes. Ann RN [facility] also made aware. Dr Roger Shelter stated he would come by to see and speak with patient. Patient stated again that she has no wish to harm herself currently.

## 2022-09-13 NOTE — ED Triage Notes (Signed)
Pt to ED from home via [facility].  Pt's husband called EMS for hyperglycemia and lethargy. On their arrival, pt fsbs=387, BP=78/50, HR=40-60s.  Pt slow to answer questions.   On arrival GCS=14, pt opens eyes to verbal stimuli. Pt denies any pain. Pt states she has had slurred speech and feeling confused all day.  Pt's daughter, Regis Bill ([phone]), called and states she spoke with pt at 2030 and pt was talking normally, pt's fsbs=267 at 2100, pt was given 15U insulin. Pt then started getting confused, feeling hot and had slurred speech and fsbs=400, pt given insulin 15U.  Unknown time for second dose of insulin was given.  Daughter states that pt is being treated for UTI with cipro and was septic two months ago.

## 2022-09-13 NOTE — Progress Notes (Signed)
PROGRESS NOTE    Patient: Jeanne Kim                            PCP: [name], NP                    DOB: [birth year]            DOA: 09/13/2022 [id number]:[id number]             DOS: 09/13/2022, 10:30 AM   LOS: 0 days   Date of Service: The patient was seen and examined on 09/13/2022  Subjective:   The patient was seen and examined this morning. Hemodynamically stable, much more awake alert, denies any chest pain or shortness of breath reporting chronic constipation.  Reporting previous history of kidney stones becoming septic in the past.  Stating she progressively was not feeling well in the past few days    Brief Narrative:    Jeanne Kim is a 81 y.o. female with medical history significant of anginal pain, carotid artery plaque, coronary artery disease, depression, GERD, fibromyalgia, hyperlipidemia, hypertension, hypothyroidism, sleep apnea, type 2 diabetes mellitus, and more presents the ED with a chief complaint of "I am dying."  Patient seems confused but has not recently received any medications that should cause her to be confused.  She reports that she is here because she was dying.  After reassurance, she changes her story to chest pain.  She reports that her heart muscle is giving out, which does not appear to have been discussed during this hospitalization so far.  According to chart review patient's husband called EMS because patient was hypoglycemic and lethargic.  Patient had GCS of 14 at presentation, and she had reported to the nurse that she was had slurred speech and felt confused all day.     Patient was discharged in August after similar presentation.  She did had generalized weakness for a week at that time and decreased oral intake with decreased urine output at that time.  She was noted to be a poor historian at that time as well due to cognitive impairment and acute encephalopathy.  Initially she was treated for sepsis, but it was ruled out. Her blood  pressure remained soft and she was started on midodrine.  Patient was discharged home without any antihypertensives and with a plan to wean off midodrine.   ED Temp 94, heart rate 49-83, respiratory rate 13-21, blood pressure 59/33-98/65, satting 96% Leukocytosis at 14.8, hemoglobin 9.5 Hyponatremia 131, hypokalemia 3.1, hyperglycemia 314 Albumin 3.4 Lactic acidosis 4.5, 3.8 Tropes 3, 3 UA is not indicative of UTI Blood culture pending CT renal stone study shows constipation Chest x-ray shows low lung volumes and mild atelectasis at the lung bases Patient was started on vancomycin and cefepime for empiric treatment of possible sepsis Normal saline 2-1/2 L bolus given EKG shows a heart rate of 50, sinus rhythm, QTc 479  Admission requested for hypotension, SIRS.      Assessment & Plan:   Principal Problem:   SIRS (systemic inflammatory response syndrome) (HCC) Active Problems:   Acute metabolic encephalopathy   Hypothyroidism   Essential hypertension   CAD (coronary artery disease)   Chest pain   Uncontrolled type 2 diabetes mellitus with hyperglycemia, with long-term current use of insulin (HCC)   Electrolyte abnormality   Hypokalemia   Chronic constipation     Assessment and Plan: * SIRS (systemic inflammatory response  syndrome) ([facility]) -Patient met SIRS criteria on admission with leukocytosis of 14.8, hypotension, lactic acidosis with no endorgan damage - No source of infection has been identified -Ruling out sepsis - Covered with vancomycin and cefepime-continuing empiric antibiotics for now - UA is not indicative of UTI - No signs of infection on CT renal study - Chest x-ray shows no low lung volumes with mild atelectasis at the lung bases - Blood cultures pending - Continue to trend lactic acid - Continue IVF   Acute metabolic encephalopathy -Improved mentation -Improved hypertension  - Consistent with previous presentation - No focal deficits on exam -  Continue to monitor  Chronic constipation - Again demonstrated on CT - Start MiraLAX  Hypokalemia - Potassium 3.1 >>4.4  - Replace and recheck  Electrolyte abnormality - With hypokalemia and hyponatremia as well as hypochloremia - Repleting potassium, and electrolytes with IVF LR   - Hold Lasix - Will consider holding Cymbalta if electrolytes do not improve - With hyponatremia and hypotension -improved with IV fluids - Continue to monitor  Uncontrolled type 2 diabetes mellitus with hyperglycemia, with long-term current use of insulin (HCC) - Patient is on 20 units of basal insulin at home - Continue reduced dose of basal insulin - Continue sliding scale coverage - Hemoglobin A1c pending Continue to monitor  Chest pain - Currently denies of having any chest pain - Troponin 3, 3 - EKG without acute ST segment changes - Monitor on telemetry  CAD (coronary artery disease) - No chest pain - EKG is without acute ST changes - Monitor on telemetry  -Heart cath in 2006 showing non-obstructive CAD.  -Stress test in 2017 negative.  -Hx of atypical CP, but no recent use of Nitro.    -Continue home Metoprolol, Norvasc, statin, ASA.  Essential hypertension - Hypotension-resolved - Hypertensive now, discontinuing Levophed, discontinuing midodrine - Holding home antihypertensive meds for now   Hypothyroidism - Continue Synthroid - TSH: 0.565   ----------------------------------------------------------------------------------------------------------------------------------------------- Nutritional status:  The patient's BMI is: Body mass index is 21.35 kg/m. I agree with the assessment and plan as outlined ---------------------------------------------------------------------------------------------------------------------------------------------------- Cultures; Blood Cultures x 2 >> NGT Urine Culture  >>> NGT  Sputum Culture >> NGT     ------------------------------------------------------------------------------------------------------------------------------------------------  DVT prophylaxis:  SCDs   Code Status:   Code Status: Prior  Family Communication: No family member present at bedside- attempt will be made to update daily The above findings and plan of care has been discussed with patient (and family)  in detail,  they expressed understanding and agreement of above. -Advance care planning has been discussed.   Admission status:   Status is: Inpatient Remains inpatient appropriate because: Needing aggressive IV fluids, IV antibiotics, ruling out sepsis     Procedures:   No admission procedures for hospital encounter.   Antimicrobials:  Anti-infectives (From admission, onward)    Start     Dose/Rate Route Frequency Ordered Stop   09/13/22 0515  vancomycin (VANCOCIN) IVPB 1000 mg/200 mL premix        1,000 mg 200 mL/hr over 60 Minutes Intravenous  Once 09/13/22 0509 09/13/22 0636   09/13/22 0515  ceFEPIme (MAXIPIME) 2 g in sodium chloride 0.9 % 100 mL IVPB        2 g 200 mL/hr over 30 Minutes Intravenous  Once 09/13/22 0509 09/13/22 0636        Medication:   insulin detemir  15 Units Subcutaneous QHS   polyethylene glycol  17 g Oral Daily   senna-docusate  2  tablet Oral BID       Objective:   Vitals:   09/13/22 1000 09/13/22 1005 09/13/22 1010 09/13/22 1015  BP: 106/84 (!) 103/58 (!) 104/53 (!) 113/55  Pulse:    (!) 58  Resp: (!) _0 (!) 22  Temp:      TempSrc:      SpO2:    99%  Weight:      Height:       No intake or output data in the 24 hours ending 09/13/22 1030 Filed Weights   09/13/22 0034  Weight: 60 kg     Examination:   Physical Exam  Constitution:  Alert, cooperative, no distress,  Appears calm and comfortable  Psychiatric:   Normal and stable mood and affect, cognition intact,   HEENT:        Normocephalic, PERRL, otherwise with in Normal limits   Chest:         Chest symmetric Cardio vascular:  S1/S2, RRR, No murmure, No Rubs or Gallops  pulmonary: Clear to auscultation bilaterally, respirations unlabored, negative wheezes / crackles Abdomen: Soft, non-tender, non-distended, bowel sounds,no masses, no organomegaly Muscular skeletal: Limited exam - in bed, able to move all 4 extremities,   Neuro: CNII-XII intact. , normal motor and sensation, reflexes intact  Extremities: No pitting edema lower extremities, +2 pulses  Skin: Dry, warm to touch, negative for any Rashes, No open wounds Wounds: per nursing documentation   ------------------------------------------------------------------------------------------------------------------------------------------    LABs:     Latest Ref Rng & Units 09/13/2022   12:29 AM 05/21/2022    5:57 AM 05/20/2022    5:49 AM  CBC  WBC 4.0 - 10.5 K/uL 14.8  16.4  12.4   Hemoglobin 12.0 - 15.0 g/dL 9.5  11.3  10.4   Hematocrit 36.0 - 46.0 % 32.5  37.3  34.2   Platelets 150 - 400 K/uL 404  500  471       Latest Ref Rng & Units 09/13/2022    8:23 AM 09/13/2022   12:29 AM 05/28/2022    8:56 AM  CMP  Glucose 70 - 99 mg/dL 192  314  168   BUN 8 - 23 mg/dL _1 Creatinine 0.44 - 1.00 mg/dL 0.84  1.07  0.51   Sodium 135 - 145 mmol/L 135  131  135   Potassium 3.5 - 5.1 mmol/L 4.4  3.1  3.1   Chloride 98 - 111 mmol/L 103  95  98   CO2 22 - 32 mmol/L _2 Calcium 8.9 - 10.3 mg/dL 8.2  8.5  9.0   Total Protein 6.5 - 8.1 g/dL 6.5  6.4    Total Bilirubin 0.3 - 1.2 mg/dL 1.0  1.0    Alkaline Phos 38 - 126 U/L 57  56    AST 15 - 41 U/L 28  21    ALT 0 - 44 U/L 19  13         Micro Results Recent Results (from the past 240 hour(s))  Blood Culture (routine x 2)     Status: None (Preliminary result)   Collection Time: 09/13/22 12:29 AM   Specimen: BLOOD  Result Value Ref Range Status   Specimen Description BLOOD BLOOD RIGHT ARM  Final   Special Requests   Final    BOTTLES DRAWN  AEROBIC AND ANAEROBIC Blood Culture adequate volume   Culture   Final    NO GROWTH <  12 HOURS Performed at [facility], [address] [place], [place] [address]    Report Status PENDING  Incomplete  Blood Culture (routine x 2)     Status: None (Preliminary result)   Collection Time: 09/13/22 12:35 AM   Specimen: BLOOD  Result Value Ref Range Status   Specimen Description BLOOD BLOOD RIGHT HAND  Final   Special Requests   Final    BOTTLES DRAWN AEROBIC AND ANAEROBIC Blood Culture adequate volume   Culture   Final    NO GROWTH < 12 HOURS Performed at [facility], [address] [place], [place] [address]    Report Status PENDING  Incomplete    Radiology Reports CT Renal Stone Study  Result Date: 09/13/2022 CLINICAL DATA:  Abdominal/flank pain, stone suspected. EXAM: CT ABDOMEN AND PELVIS WITHOUT CONTRAST TECHNIQUE: Multidetector CT imaging of the abdomen and pelvis was performed following the standard protocol without IV contrast. RADIATION DOSE REDUCTION: This exam was performed according to the departmental dose-optimization program which includes automated exposure control, adjustment of the mA and/or kV according to patient size and/or use of iterative reconstruction technique. COMPARISON:  05/16/2022. FINDINGS: Lower chest: Scattered coronary artery calcifications are noted. Atelectasis is noted at the lung bases. Hepatobiliary: Scattered subcentimeter hypodensities are present in the liver which are too small to further characterize. No biliary ductal dilatation. The gallbladder is not seen on exam. Pancreas: Pancreatic atrophy. No pancreatic ductal dilatation or surrounding inflammatory changes. Spleen: Normal in size without focal abnormality. Adrenals/Urinary Tract: The adrenal glands are within normal limits. No renal calculus bilaterally. No definite ureteral calculus or obstructive uropathy. A small amount of air is present in the anterior aspect of the urinary bladder, which may  be iatrogenic. Stomach/Bowel: There is a moderate hiatal hernia. Stomach is otherwise within normal limits. Appendix appears normal. No evidence of bowel wall thickening, distention, or inflammatory changes. No free air or pneumatosis. A large amount of retained stool is present in the colon. Vascular/Lymphatic: Aortic atherosclerosis. No enlarged abdominal or pelvic lymph nodes. Reproductive: Status post hysterectomy. No adnexal masses. Other: No abdominopelvic ascites. Musculoskeletal: Degenerative changes are present in the thoracolumbar spine. No acute or suspicious osseous abnormality. Total hip arthroplasty changes are noted on the left. IMPRESSION: 1. No renal or ureteral calculus or obstructive uropathy bilaterally. 2. Large amount of retained stool in the colon suggesting constipation. 3. Moderate hiatal hernia. 4. Aortic atherosclerosis. Electronically Signed   By: Brett Fairy M.D.   On: 09/13/2022 03:25   DG Chest Port 1 View  Result Date: 09/13/2022 CLINICAL DATA:  Possible sepsis.  Hyperglycemia and lethargy. EXAM: PORTABLE CHEST 1 VIEW COMPARISON:  05/15/2022. FINDINGS: The heart size and mediastinal contours are within normal limits. There is atherosclerotic calcification of the aorta. Lung volumes are low with mild atelectasis at the lung bases. No consolidation, effusion, or pneumothorax. Cervical spinal fusion hardware is noted. Degenerative changes are present in the thoracic spine. IMPRESSION: Low lung volumes with mild atelectasis at the lung bases. Electronically Signed   By: Brett Fairy M.D.   On: 09/13/2022 01:07    SIGNED: Deatra James, MD, FHM. > 77 min.  Of critical time was spent seeing and evaluating this patient, drawn plan of care, reviewing all electronic medical records discussing with medical staff   [facility],  Pager (please use [email or web address] to page/text) Please use Epic Secure Chat for non-urgent communication (7AM-7PM)  If 7PM-7AM, please contact  night-coverage [email or web address], 09/13/2022, 10:30 AM

## 2022-09-13 NOTE — Assessment & Plan Note (Addendum)
-   Currently denies of having any chest pain - Troponin 3, 3 - EKG without acute ST segment changes - Discontinue telemetry

## 2022-09-13 NOTE — Assessment & Plan Note (Addendum)
-   With hypokalemia and hyponatremia as well as hypochloremia -- K+ 3.1 >>3.0 >>> 4.2 now  -Potassium and magnesium were repleted   - Hold Lasix - Will consider holding Cymbalta if electrolytes do not improve -  Hyponatremia and hypotension -improved with IV fluids - Continue to monitor

## 2022-09-13 NOTE — Assessment & Plan Note (Addendum)
-   Hypotension-resolved - Hypertensive now, discontinued Levophed, discontinued PO  midodrine - Holding home antihypertensive meds for now

## 2022-09-13 NOTE — Assessment & Plan Note (Addendum)
-  Much improved back to baseline  -Improved hypotension -Improved sepsis physiology  - Consistent with previous presentation - No focal deficits on exam - Continue to monitor

## 2022-09-13 NOTE — ED Provider Notes (Signed)
[facility] Provider Note   CSN: [id number] Arrival date & time: 09/13/22  0001     History  No chief complaint on file.   Jeanne Kim is a 81 y.o. female.  Patient is a 81 year old female with past medical history of type 2 diabetes, hypertension, hyperlipidemia, irritable bowel, fibromyalgia.  Patient brought by EMS from home for evaluation of weakness and low blood pressure.  From what I am told, patient has been running moderately elevated blood sugars at home and has received 2 doses of insulin this evening.  Patient denies to me she is having any nausea, vomiting, or diarrhea.  She denies any fevers or chills.  There are no aggravating or alleviating factors.  She arrives here with a blood pressure of 60 systolic.  The history is provided by the patient.       Home Medications Prior to Admission medications   Medication Sig Start Date End Date Taking? Authorizing Provider  ASPIRIN LOW DOSE 81 MG tablet Take 81 mg by mouth every morning. 04/17/22   [provider]  DULoxetine (CYMBALTA) 60 MG capsule Take 1 capsule (60 mg total) by mouth daily. 06/01/22   [name], NP  EPINEPHrine 0.3 mg/0.3 mL IJ SOAJ injection Inject 0.3 mg into the muscle daily as needed for anaphylaxis. 07/18/09   [provider]  furosemide (LASIX) 20 MG tablet Take 1 tablet (20 mg total) by mouth daily. 06/01/22   [name], NP  Insulin Glargine-Lixisenatide (SOLIQUA) 100-33 UNT-MCG/ML SOPN Inject 20 Units into the skin daily. Sliding Scale 06/01/22   [name], NP  JARDIANCE 25 MG TABS tablet Take 1 tablet (25 mg total) by mouth daily. 06/01/22   [name], NP  ketoconazole (NIZORAL) 2 % cream Apply 1 Application topically 2 (two) times daily. Apply to fungal rash perineal area 06/01/22   [name], NP  levothyroxine (SYNTHROID) 150 MCG tablet Take 1 tablet (150 mcg total) by mouth daily at 6 (six) AM. 06/01/22   Nyoka Cowden, Phylis Bougie, NP   linaclotide Rolan Lipa) 290 MCG CAPS capsule Take 1 capsule (290 mcg total) by mouth daily before breakfast. 06/01/22   [name], NP  Melatonin 10 MG TABS Take 10 mg by mouth at bedtime as needed (sleep).    [provider]  potassium chloride SA (KLOR-CON M) 20 MEQ tablet Take 2 tablets (40 mEq total) by mouth 2 (two) times daily. 06/01/22   [name], NP      Allergies    Codeine, Sulfonamide derivatives, Canagliflozin, Penicillins, and Sulfa antibiotics    Review of Systems   Review of Systems  All other systems reviewed and are negative.   Physical Exam Updated Vital Signs There were no vitals taken for this visit. Physical Exam Vitals and nursing note reviewed.  Constitutional:      General: She is not in acute distress.    Appearance: She is well-developed. She is not diaphoretic.     Comments: Patient is an elderly female who appears chronically ill.  She is somnolent, but arousable and appropriate and moves all extremities.  HENT:     Head: Normocephalic and atraumatic.  Cardiovascular:     Rate and Rhythm: Normal rate and regular rhythm.     Heart sounds: No murmur heard.    No friction rub. No gallop.  Pulmonary:     Effort: Pulmonary effort is normal. No respiratory distress.     Breath sounds: Normal  breath sounds. No wheezing.  Abdominal:     General: Bowel sounds are normal. There is no distension.     Palpations: Abdomen is soft.     Tenderness: There is no abdominal tenderness.  Musculoskeletal:        General: Normal range of motion.     Cervical back: Normal range of motion and neck supple.  Skin:    General: Skin is warm and dry.  Neurological:     General: No focal deficit present.     Mental Status: She is oriented to person, place, and time.     ED Results / Procedures / Treatments   Labs (all labs ordered are listed, but only abnormal results are displayed) Labs Reviewed  CULTURE, BLOOD (ROUTINE X 2)  CULTURE, BLOOD  (ROUTINE X 2)  URINE CULTURE  LACTIC ACID, PLASMA  LACTIC ACID, PLASMA  COMPREHENSIVE METABOLIC PANEL  CBC WITH DIFFERENTIAL/PLATELET  PROTIME-INR  APTT  URINALYSIS, ROUTINE W REFLEX MICROSCOPIC  TROPONIN I (HIGH SENSITIVITY)    EKG None  Radiology No results found.  Procedures Procedures    Medications Ordered in ED Medications  sodium chloride 0.9 % bolus 1,000 mL (has no administration in time range)    ED Course/ Medical Decision Making/ A&P  Patient is a 81 year old female with past medical history as per HPI presenting with complaints of weakness and hypotension.  Patient denies any specific symptoms and denies any fevers or chills.  It appears as though she was admitted in August with a similar episode diagnosed as SIRS, but cultures all returned negative.  Patient arrives here initially with blood pressure of 70/40, but no tachycardia.  Initial temperature is 94.  Workup initiated including sepsis labs.  She has a leukocytosis with white count of 14.8 and lactate of 4.5, but laboratory studies are otherwise not significantly abnormal.  IV fluids were initiated and patient given a total of 2500 cc of normal saline.  Blood pressures had initially trended upward at 1 point as high as 98/65.  Urinalysis has come back without evidence for infection and chest x-ray shows no acute infiltrate.  Blood cultures are currently pending.  Patient did receive vancomycin and cefepime for possible sepsis.  She will be admitted to the hospitalist service for trending of blood cultures.  CRITICAL CARE Performed by: Veryl Speak Total critical care time: 45 minutes Critical care time was exclusive of separately billable procedures and treating other patients. Critical care was necessary to treat or prevent imminent or life-threatening deterioration. Critical care was time spent personally by me on the following activities: development of treatment plan with patient and/or surrogate as  well as nursing, discussions with consultants, evaluation of patient's response to treatment, examination of patient, obtaining history from patient or surrogate, ordering and performing treatments and interventions, ordering and review of laboratory studies, ordering and review of radiographic studies, pulse oximetry and re-evaluation of patient's condition.   Final Clinical Impression(s) / ED Diagnoses Final diagnoses:  None    Rx / DC Orders ED Discharge Orders     None         Veryl Speak, MD 09/13/22 [id number]

## 2022-09-13 NOTE — Assessment & Plan Note (Addendum)
-   Continue Synthroid - TSH: 0.565

## 2022-09-14 LAB — GLUCOSE, CAPILLARY
Glucose-Capillary: 170 mg/dL — ABNORMAL HIGH (ref 70–99)
Glucose-Capillary: 90 mg/dL (ref 70–99)
Glucose-Capillary: 141 mg/dL — ABNORMAL HIGH (ref 70–99)
Glucose-Capillary: 121 mg/dL — ABNORMAL HIGH (ref 70–99)
Glucose-Capillary: 214 mg/dL — ABNORMAL HIGH (ref 70–99)

## 2022-09-14 LAB — COMPREHENSIVE METABOLIC PANEL
ALT: 13 U/L (ref 0–44)
AST: 18 U/L (ref 15–41)
Albumin: 2.7 g/dL — ABNORMAL LOW (ref 3.5–5.0)
Alkaline Phosphatase: 50 U/L (ref 38–126)
Anion gap: 5 (ref 5–15)
BUN: 12 mg/dL (ref 8–23)
CO2: 27 mmol/L (ref 22–32)
Calcium: 8.2 mg/dL — ABNORMAL LOW (ref 8.9–10.3)
Chloride: 104 mmol/L (ref 98–111)
Creatinine, Ser: 0.74 mg/dL (ref 0.44–1.00)
GFR, Estimated: >60 mL/min (ref >60)
Glucose, Bld: 141 mg/dL — ABNORMAL HIGH (ref 70–99)
Potassium: 3 mmol/L — ABNORMAL LOW (ref 3.5–5.1)
Sodium: 136 mmol/L (ref 135–145)
Total Bilirubin: 0.6 mg/dL (ref 0.3–1.2)
Total Protein: 5.4 g/dL — ABNORMAL LOW (ref 6.5–8.1)

## 2022-09-14 LAB — CBC WITH DIFFERENTIAL/PLATELET
Abs Immature Granulocytes: 0.05 10*3/uL (ref 0.00–0.07)
Basophils Absolute: 0.1 10*3/uL (ref 0.0–0.1)
Basophils Relative: 1 %
Eosinophils Absolute: 0.5 10*3/uL (ref 0.0–0.5)
Eosinophils Relative: 5 %
HCT: 31.2 % — ABNORMAL LOW (ref 36.0–46.0)
Hemoglobin: 9.1 g/dL — ABNORMAL LOW (ref 12.0–15.0)
Immature Granulocytes: 0 %
Lymphocytes Relative: 25 %
Lymphs Abs: 2.8 10*3/uL (ref 0.7–4.0)
MCH: 21.5 pg — ABNORMAL LOW (ref 26.0–34.0)
MCHC: 29.2 g/dL — ABNORMAL LOW (ref 30.0–36.0)
MCV: 73.8 fL — ABNORMAL LOW (ref 80.0–100.0)
Monocytes Absolute: 1.4 10*3/uL — ABNORMAL HIGH (ref 0.1–1.0)
Monocytes Relative: 12 %
Neutro Abs: 6.6 10*3/uL (ref 1.7–7.7)
Neutrophils Relative %: 57 %
Platelets: 352 10*3/uL (ref 150–400)
RBC: 4.23 MIL/uL (ref 3.87–5.11)
RDW: 17.9 % — ABNORMAL HIGH (ref 11.5–15.5)
WBC: 11.4 10*3/uL — ABNORMAL HIGH (ref 4.0–10.5)
nRBC: 0 % (ref 0.0–0.2)

## 2022-09-14 LAB — URINE CULTURE: Culture: NO GROWTH

## 2022-09-14 LAB — MAGNESIUM: Magnesium: 1.1 mg/dL — ABNORMAL LOW (ref 1.7–2.4)

## 2022-09-14 LAB — MRSA NEXT GEN BY PCR, NASAL: MRSA by PCR Next Gen: NOT DETECTED

## 2022-09-14 MED ORDER — POTASSIUM CHLORIDE CRYS ER 20 MEQ PO TBCR
40.0000 meq | EXTENDED_RELEASE_TABLET | Freq: Once | ORAL | Status: AC
  Administered 2022-09-14: 40 meq via ORAL
  Filled 2022-09-14: qty 2

## 2022-09-14 MED ORDER — MAGNESIUM CITRATE PO SOLN
1.0000 | Freq: Once | ORAL | Status: AC
  Administered 2022-09-14: 1 via ORAL
  Filled 2022-09-14: qty 296

## 2022-09-14 MED ORDER — MAGNESIUM SULFATE 2 GM/50ML IV SOLN
2.0000 g | Freq: Once | INTRAVENOUS | Status: AC
  Administered 2022-09-14: 2 g via INTRAVENOUS
  Filled 2022-09-14: qty 50

## 2022-09-14 NOTE — Progress Notes (Signed)
PROGRESS NOTE    Patient: Jeanne Kim                            PCP: Jeanne Fee, NP                    DOB: Nov 04, 1940            DOA: 09/13/2022 ZOX:[id number]             DOS: 09/14/2022, 12:11 PM   LOS: 1 day   Date of Service: The patient was seen and examined on 09/14/2022  Subjective:   [name] was seen this morning, awake alert oriented x 3 no acute distress, husband present at bedside No issues overnight Hemodynamically stable Denies having any suicidal homicidal ideation  Brief Narrative:    Jeanne Kim is a 81 y.o. female with medical history significant of anginal pain, carotid artery plaque, coronary artery disease, depression, GERD, fibromyalgia, hyperlipidemia, hypertension, hypothyroidism, sleep apnea, type 2 diabetes mellitus, and more presents the ED with a chief complaint of "I am dying."  Patient seems confused but has not recently received any medications that should cause her to be confused.  She reports that she is here because she was dying.  After reassurance, she changes her story to chest pain.  She reports that her heart muscle is giving out, which does not appear to have been discussed during this hospitalization so far.  According to chart review patient's husband called EMS because patient was hypoglycemic and lethargic.  Patient had GCS of 14 at presentation, and she had reported to the nurse that she was had slurred speech and felt confused all day.     Patient was discharged in August after similar presentation.  She did had generalized weakness for a week at that time and decreased oral intake with decreased urine output at that time.  She was noted to be a poor historian at that time as well due to cognitive impairment and acute encephalopathy.  Initially she was treated for sepsis, but it was ruled out. Her blood pressure remained soft and she was started on midodrine.  Patient was discharged home without any antihypertensives and with a  plan to wean off midodrine.   ED Temp 94, heart rate 49-83, respiratory rate 13-21, blood pressure 59/33-98/65, satting 96% Leukocytosis at 14.8, hemoglobin 9.5 Hyponatremia 131, hypokalemia 3.1, hyperglycemia 314 Albumin 3.4 Lactic acidosis 4.5, 3.8 Tropes 3, 3 UA is not indicative of UTI Blood culture pending CT renal stone study shows constipation Chest x-ray shows low lung volumes and mild atelectasis at the lung bases Patient was started on vancomycin and cefepime for empiric treatment of possible sepsis Normal saline 2-1/2 L bolus given EKG shows a heart rate of 50, sinus rhythm, QTc 479  Admission requested for hypotension, SIRS.      Assessment & Plan:   Principal Problem:   SIRS (systemic inflammatory response syndrome) (HCC) Active Problems:   Acute metabolic encephalopathy   Hypothyroidism   Essential hypertension   CAD (coronary artery disease)   Chest pain   Uncontrolled type 2 diabetes mellitus with hyperglycemia, with long-term current use of insulin (HCC)   Electrolyte abnormality   Hypokalemia   Chronic constipation     Assessment and Plan: * SIRS (systemic inflammatory response syndrome) ([place]) -Patient met SIRS criteria on admission with leukocytosis of 14.8, hypotension, lactic acidosis with no endorgan damage - No source of  infection has been identified -Sepsis ruled out, still meeting SIRS criteria  Vitals today> Blood pressure (!) 105/34, pulse (!) 57, temperature (!) 97.5 F (36.4 C), resp. rate 20, SpO2 93 %.  Lactic acid 3.8 >> 1.8 Procalcitonin-- <0.10  WBC 14.8>> 11.4 - Covered with vancomycin and cefepime-continuing empiric antibiotics for now - UA is not indicative of UTI - No signs of infection on CT renal study - Chest x-ray shows no low lung volumes with mild atelectasis at the lung bases - Blood cultures >>> no growth to date - Continue to trend lactic acid - Continue IVF --- reducing rate  Acute metabolic  encephalopathy -Mentation back to baseline, stable -Improved hypotension -Improved sepsis physiology  - Consistent with previous presentation - No focal deficits on exam - Continue to monitor  Chronic constipation - Again demonstrated on CT -No BM reported yet - On MiraLAX, added Senokot, and one-time mag citrate today  Hypokalemia - Potassium 3.1 >>3.0  - Replace and recheck  Electrolyte abnormality - With hypokalemia and hyponatremia as well as hypochloremia -- K+ 3.1 >>3.0  -P.o. KCl 40 mEq - Repleting potassium, and electrolytes with IVF LR   - Hold Lasix - Will consider holding Cymbalta if electrolytes do not improve -  Hyponatremia and hypotension -improved with IV fluids - Continue to monitor  Uncontrolled type 2 diabetes mellitus with hyperglycemia, with long-term current use of insulin ([place]) - Patient is on 20 units of basal insulin at home - Continue reduced dose of basal insulin - Continue sliding scale coverage - Hemoglobin A1c pending Continue to monitor CBG (last 3)  Recent Labs    09/14/22 0148 09/14/22 0724 09/14/22 1153  GLUCAP 170* 90 141*     Chest pain - Currently denies of having any chest pain - Troponin 3, 3 - EKG without acute ST segment changes - Monitor on telemetry  CAD (coronary artery disease) - Denies of having chest pain or shortness of breath - EKG is without acute ST changes - Monitor on telemetry  -Heart cath in 2006 showing non-obstructive CAD.  -Stress test in 2017 negative.  -Hx of atypical CP, but no recent use of Nitro.    -Continue home Metoprolol, Norvasc, statin, ASA.  Essential hypertension - Hypotension-resolved - Hypertensive now, discontinued Levophed, discontinued PO  midodrine - Holding home antihypertensive meds for now   Hypothyroidism - Continue Synthroid - TSH:  0.565   ----------------------------------------------------------------------------------------------------------------------------------------------- Nutritional status:  The patient's BMI is: Body mass index is 26.16 kg/m. I agree with the assessment and plan as outlined ---------------------------------------------------------------------------------------------------------------------------------------------------- Cultures; Blood Cultures x 2 >> NGT Urine Culture  >>> NGT  Sputum Culture >> NGT    ------------------------------------------------------------------------------------------------------------------------------------------------  DVT prophylaxis:  heparin injection 5,000 Units Start: 09/13/22 2200 SCDs Start: 09/13/22 1354SCDs   Code Status:   Code Status: Full Code  Family Communication: Husband present at bedside, updated The above findings and plan of care has been discussed with patient (and family)  in detail,  they expressed understanding and agreement of above. -Advance care planning has been discussed.   Admission status:   Status is: Inpatient Remains inpatient appropriate because: Needing aggressive IV fluids, IV antibiotics, ruling out sepsis     Procedures:   No admission procedures for hospital encounter.   Antimicrobials:  Anti-infectives (From admission, onward)    Start     Dose/Rate Route Frequency Ordered Stop   09/14/22 0600  vancomycin (VANCOREADY) IVPB 1250 mg/250 mL        1,250 mg 166.7  mL/hr over 90 Minutes Intravenous Every 24 hours 09/13/22 1713     09/13/22 1800  ceFEPIme (MAXIPIME) 2 g in sodium chloride 0.9 % 100 mL IVPB        2 g 200 mL/hr over 30 Minutes Intravenous Every 12 hours 09/13/22 1711     09/13/22 0515  vancomycin (VANCOCIN) IVPB 1000 mg/200 mL premix        1,000 mg 200 mL/hr over 60 Minutes Intravenous  Once 09/13/22 0509 09/13/22 0636   09/13/22 0515  ceFEPIme (MAXIPIME) 2 g in sodium chloride 0.9 % 100  mL IVPB        2 g 200 mL/hr over 30 Minutes Intravenous  Once 09/13/22 0509 09/13/22 0636        Medication:   aspirin EC  81 mg Oral q morning   atorvastatin  20 mg Oral Daily   Chlorhexidine Gluconate Cloth  6 each Topical Q0600   doxepin  10 mg Oral QHS   DULoxetine  60 mg Oral Daily   FLUoxetine  40 mg Oral Daily   heparin  5,000 Units Subcutaneous Q8H   insulin aspart  0-15 Units Subcutaneous TID WC   insulin aspart  0-5 Units Subcutaneous QHS   insulin detemir  15 Units Subcutaneous QHS   levothyroxine  150 mcg Oral Q0600   metoprolol succinate  50 mg Oral Daily   polyethylene glycol  17 g Oral Daily   senna-docusate  2 tablet Oral BID       Objective:   Vitals:   09/14/22 0400 09/14/22 0500 09/14/22 0725 09/14/22 1100  BP: (!) 106/48   (!) 105/34  Pulse: (!) 55   (!) 57  Resp: 20   20  Temp: 97.7 F (36.5 C)  (!) 97.5 F (36.4 C)   TempSrc: Oral  Oral   SpO2: 94%   93%  Weight:  71.3 kg    Height:        Intake/Output Summary (Last 24 hours) at 09/14/2022 1211 Last data filed at 09/14/2022 1100 Gross per 24 hour  Intake 2267.12 ml  Output 2050 ml  Net 217.12 ml   Filed Weights   09/13/22 0034 09/13/22 1235 09/14/22 0500  Weight: 60 kg 70.8 kg 71.3 kg     Examination:    General:  AAO x 3,  cooperative, no distress;   HEENT:  Normocephalic, PERRL, otherwise with in Normal limits   Neuro:  CNII-XII intact. , normal motor and sensation, reflexes intact   Lungs:   Clear to auscultation BL, Respirations unlabored,  No wheezes / crackles  Cardio:    S1/S2, RRR, No murmure, No Rubs or Gallops   Abdomen:  Soft, non-tender, bowel sounds active all four quadrants, no guarding or peritoneal signs.  Muscular  skeletal:  Limited exam -global generalized weaknesses - in bed, able to move all 4 extremities,   2+ pulses,  symmetric, No pitting edema  Skin:  Dry, warm to touch, negative for any Rashes,  Wounds: Please see nursing documentation           ------------------------------------------------------------------------------------------------------------------------------------------    LABs:     Latest Ref Rng & Units 09/14/2022    4:19 AM 09/13/2022   12:29 AM 05/21/2022    5:57 AM  CBC  WBC 4.0 - 10.5 K/uL 11.4  14.8  16.4   Hemoglobin 12.0 - 15.0 g/dL 9.1  9.5  11.3   Hematocrit 36.0 - 46.0 % 31.2  32.5  37.3   Platelets 150 -  400 K/uL 352  404  500       Latest Ref Rng & Units 09/14/2022    4:19 AM 09/13/2022    8:23 AM 09/13/2022   12:29 AM  CMP  Glucose 70 - 99 mg/dL 141  192  314   BUN 8 - 23 mg/dL _0 Creatinine 0.44 - 1.00 mg/dL 0.74  0.84  1.07   Sodium 135 - 145 mmol/L 136  135  131   Potassium 3.5 - 5.1 mmol/L 3.0  4.4  3.1   Chloride 98 - 111 mmol/L 104  103  95   CO2 22 - 32 mmol/L _1 Calcium 8.9 - 10.3 mg/dL 8.2  8.2  8.5   Total Protein 6.5 - 8.1 g/dL 5.4  6.5  6.4   Total Bilirubin 0.3 - 1.2 mg/dL 0.6  1.0  1.0   Alkaline Phos 38 - 126 U/L 50  57  56   AST 15 - 41 U/L _2 ALT 0 - 44 U/L _3 Micro Results Recent Results (from the past 240 hour(s))  Blood Culture (routine x 2)     Status: None (Preliminary result)   Collection Time: 09/13/22 12:29 AM   Specimen: BLOOD  Result Value Ref Range Status   Specimen Description BLOOD BLOOD RIGHT ARM  Final   Special Requests   Final    BOTTLES DRAWN AEROBIC AND ANAEROBIC Blood Culture adequate volume   Culture   Final    NO GROWTH < 12 HOURS Performed at [facility], [address], [place], [place] [address]    Report Status PENDING  Incomplete  Blood Culture (routine x 2)     Status: None (Preliminary result)   Collection Time: 09/13/22 12:35 AM   Specimen: BLOOD  Result Value Ref Range Status   Specimen Description BLOOD BLOOD RIGHT HAND  Final   Special Requests   Final    BOTTLES DRAWN AEROBIC AND ANAEROBIC Blood Culture adequate volume   Culture   Final    NO GROWTH < 12 HOURS Performed at  [facility], [address], [place], [place] [address]    Report Status PENDING  Incomplete    Radiology Reports No results found.  SIGNED: Deatra James, MD, FHM. > 77 min.  Of critical time was spent seeing and evaluating this patient, drawn plan of care, reviewing all electronic medical records discussing with medical staff   [facility],  Pager (please use [email or web address] to page/text) Please use Epic Secure Chat for non-urgent communication (7AM-7PM)  If 7PM-7AM, please contact night-coverage [email or web address], 09/14/2022, 12:11 PM

## 2022-09-15 LAB — COMPREHENSIVE METABOLIC PANEL
ALT: 12 U/L (ref 0–44)
AST: 15 U/L (ref 15–41)
Albumin: 2.7 g/dL — ABNORMAL LOW (ref 3.5–5.0)
Alkaline Phosphatase: 49 U/L (ref 38–126)
Anion gap: 5 (ref 5–15)
BUN: 10 mg/dL (ref 8–23)
CO2: 27 mmol/L (ref 22–32)
Calcium: 8.3 mg/dL — ABNORMAL LOW (ref 8.9–10.3)
Chloride: 103 mmol/L (ref 98–111)
Creatinine, Ser: 0.69 mg/dL (ref 0.44–1.00)
GFR, Estimated: >60 mL/min (ref >60)
Glucose, Bld: 161 mg/dL — ABNORMAL HIGH (ref 70–99)
Potassium: 4.2 mmol/L (ref 3.5–5.1)
Sodium: 135 mmol/L (ref 135–145)
Total Bilirubin: 0.4 mg/dL (ref 0.3–1.2)
Total Protein: 5.5 g/dL — ABNORMAL LOW (ref 6.5–8.1)

## 2022-09-15 LAB — CBC
HCT: 31.8 % — ABNORMAL LOW (ref 36.0–46.0)
Hemoglobin: 9.4 g/dL — ABNORMAL LOW (ref 12.0–15.0)
MCH: 21.8 pg — ABNORMAL LOW (ref 26.0–34.0)
MCHC: 29.6 g/dL — ABNORMAL LOW (ref 30.0–36.0)
MCV: 73.8 fL — ABNORMAL LOW (ref 80.0–100.0)
Platelets: 339 10*3/uL (ref 150–400)
RBC: 4.31 MIL/uL (ref 3.87–5.11)
RDW: 17.9 % — ABNORMAL HIGH (ref 11.5–15.5)
WBC: 8.6 10*3/uL (ref 4.0–10.5)
nRBC: 0 % (ref 0.0–0.2)

## 2022-09-15 LAB — GLUCOSE, CAPILLARY
Glucose-Capillary: 128 mg/dL — ABNORMAL HIGH (ref 70–99)
Glucose-Capillary: 247 mg/dL — ABNORMAL HIGH (ref 70–99)
Glucose-Capillary: 210 mg/dL — ABNORMAL HIGH (ref 70–99)
Glucose-Capillary: 175 mg/dL — ABNORMAL HIGH (ref 70–99)

## 2022-09-15 NOTE — Consult Note (Cosign Needed Addendum)
Telepsych Consultation   Reason for Consult: Suicidal thoughts Referring Physician: Dr. Stark Jock Location of Patient:[facility] Location of Provider: [place] Department  Patient Identification: Jeanne Kim  MRN:  [id number]  Principal Diagnosis: SIRS (systemic inflammatory response syndrome) ([place])  Diagnosis:  Principal Problem:   SIRS (systemic inflammatory response syndrome) ([place]) Active Problems:   Hypothyroidism   Essential hypertension   CAD (coronary artery disease)   Chest pain   Uncontrolled type 2 diabetes mellitus with hyperglycemia, with long-term current use of insulin (HCC)   Electrolyte abnormality   Hypokalemia   Acute metabolic encephalopathy   Chronic constipation  Total Time spent with patient: 30 minutes  Subjective:   Jeanne Kim is a 81 y.o. female patient admitted with chief complaint of "I am dying" in the context of SIRS (systemic inflammatory response syndrome), CAD, uncontrolled diabetes type 2 and acute metabolic encephalopathy.  Assessment: Patient presents lying on the bed at [facility] ED brought down from ICU.  Appears pleasant, calm, and coherent.  Chart reviewed and findings shared with the treatment team and discussed with Dr. Dwyane Dee.  Alert and oriented x 4, however very hard of hearing.  Speech clear, with normal volume and pattern.  Presents with euthymic mood and affect is congruent.  When asked what brought her to the hospital, patient reports, "I thought I was dying because my heart muscles are giving up."  Thought processes highly relevant without loose associations or derailment.  Thought content without false belief, preoccupation or depressive thoughts.  Patient does not appear to be responding to internal or external stimuli.  No heightened anxiety observed or psychotic symptoms.  Abnormal labs of 09/15/2022 reviewed, CMP: Glucose 161 high, calcium 8.3 low, albumin 2.7 low, and total protein 5.5 low.  CBC with  differentials: Hemoglobin 9.4 low, HCT 31.8 low, MCV 73.8 low, MCH 21.8 low, MCHC 29.6 low, RDW 17.9 high.  All abnormal labs managed by the hospitalist.  CMP to be redrawn on 09/16/2022 and CBC to be redrawn on 09/18/2022 for follow-up.  Transition of care team consult ordered for 1 heart failure, medication assistance home health/DME needs.  Patient denies SI, HI, AVH, SIB, or suicide attempts.  She further denies drug use alcohol use tobacco use or marijuana use.  She denies being followed by a therapist or a therapist at this time and denies family history of mental illness.  Patient denies access to firearms.  She endorses sleeping well last night and having a good appetite.  She endorses being safe at home, however just worried about her cardiac condition.  Supportive therapy provided about ongoing stressors.  HPI:   [name] is a 81 y.o. female with medical history significant of anginal pain, carotid artery plaque, coronary artery disease, depression, GERD, fibromyalgia, hyperlipidemia, hypertension, hypothyroidism, sleep apnea, type 2 diabetes mellitus, and more presents the ED with a chief complaint of "I am dying."  Patient seems confused but has not recently received any medications that should cause her to be confused.  She reports that she is here because she was dying.  After reassurance, she changes her story to chest pain.  She reports that her heart muscle is giving out, which does not appear to have been discussed during this hospitalization so far.  According to chart review patient's husband called EMS because patient was hypoglycemic and lethargic.  Patient had GCS of 14 at presentation, and she had reported to the nurse that she was had slurred speech and felt  confused all day.   Disposition: Based on my evaluation, patient is not at imminent danger to self or others at this time.  She does not meet the criteria for inpatient psychiatric admission although she has multimorbid adult  condition.  She can be discharged to home when medically cleared.  Forestine Na, ED treatment team and Forestine Na, ED physician made aware of patient disposition.  Past Psychiatric History: Major depressive disorder and insomnia  Risk to Self:  No Risk to Others:  No Prior Inpatient Therapy:  No Prior Outpatient Therapy:  No  Past Medical History:  Past Medical History:  Diagnosis Date   Allergy    Anemia    Anginal pain ([place])    Anxiety    Arthritis    Blood transfusion without reported diagnosis    Breast cancer ([place])    Carotid artery plaque    bilat   Cataract    Colon polyps    adenomatous   Concussion 02/2016   Driving restrictions for 6w   Coronary heart disease    Depression    Diverticulosis    Dizziness    Esophageal reflux    Esophageal stricture    Fibromyalgia    Gallstones    Hiatal hernia    Hypercholesterolemia    Hypertension    Hypothyroidism    IBS (irritable bowel syndrome)    Kidney stones    Osteoporosis    Pneumonia    recently, just finished ABO   PONV (postoperative nausea and vomiting)    Sleep apnea    CPAP   Thyroid disease 2002   Type II or unspecified type diabetes mellitus without mention of complication, not stated as uncontrolled    Unspecified gastritis and gastroduodenitis without mention of hemorrhage     Past Surgical History:  Procedure Laterality Date   ANAL RECTAL MANOMETRY N/A 08/05/2015   Procedure: ANO RECTAL MANOMETRY;  Surgeon: Mauri Pole, MD;  Location: [facility];  Service: Endoscopy;  Laterality: N/A;   APPENDECTOMY     BIOPSY  08/06/2020   Procedure: BIOPSY;  Surgeon: Eloise Harman, DO;  Location: [facility];  Service: Endoscopy;;   BIOPSY  12/21/2020   Procedure: BIOPSY;  Surgeon: Thornton Park, MD;  Location: [facility];  Service: Gastroenterology;;   BIOPSY  12/23/2020   Procedure: BIOPSY;  Surgeon: Thornton Park, MD;  Location: [facility];  Service: Gastroenterology;;   BREAST  LUMPECTOMY     left   CARDIAC CATHETERIZATION     CATARACT EXTRACTION W/PHACO Left 03/30/2013   Procedure: CATARACT EXTRACTION PHACO AND INTRAOCULAR LENS PLACEMENT ([place]);  Surgeon: Tonny Branch, MD;  Location: [facility];  Service: Ophthalmology;  Laterality: Left;  CDE:19.28   CATARACT EXTRACTION W/PHACO Right 04/09/2013   Procedure: CATARACT EXTRACTION PHACO AND INTRAOCULAR LENS PLACEMENT (I[facility]);  Surgeon: Tonny Branch, MD;  Location: [facility];  Service: Ophthalmology;  Laterality: Right;  CDE: 15.44   CERVICAL FUSION     CESAREAN SECTION     CHOLECYSTECTOMY     COLONOSCOPY WITH PROPOFOL N/A 12/23/2020   Procedure: COLONOSCOPY WITH PROPOFOL;  Surgeon: Thornton Park, MD;  [facility];  Service: Gastroenterology;  Laterality: N/A;   CYSTOCELE REPAIR     DILATION AND CURETTAGE OF UTERUS     ESOPHAGOGASTRODUODENOSCOPY (EGD) WITH PROPOFOL N/A 08/06/2020   Procedure: ESOPHAGOGASTRODUODENOSCOPY (EGD) WITH PROPOFOL;  Surgeon: Eloise Harman, DO;  [facility];  Service: Endoscopy;  Laterality: N/A;   ESOPHAGOGASTRODUODENOSCOPY (EGD) WITH PROPOFOL N/A 12/21/2020  Procedure: ESOPHAGOGASTRODUODENOSCOPY (EGD) WITH PROPOFOL;  Surgeon: Thornton Park, MD;  [facility];  Service: Gastroenterology;  Laterality: N/A;   EYE SURGERY Left 03-30-13   Cataract   EYE SURGERY Right 04-09-13   Cataract   RECTOCELE REPAIR     THYROIDECTOMY     TONSILLECTOMY     TOTAL ABDOMINAL HYSTERECTOMY     Family History:  Family History  Problem Relation Age of Onset   Sarcoidosis Mother    Diabetes Mother    Cancer Mother        sarcoma -  Right arm amputation   Hypertension Mother    Lung cancer Father    Diabetes Father    Heart disease Father        Before age 14   Urolithiasis Father    Cancer Father        Lung   Hyperlipidemia Father    Hypertension Father    Esophageal cancer Maternal Uncle    Ovarian cancer Maternal Aunt    Colon cancer Maternal Uncle    Lung cancer  Maternal Uncle    Diabetes Maternal Uncle    Diabetes Maternal Aunt    Colon polyps Maternal Uncle    Heart disease Paternal Grandmother    Family Psychiatric  History: None indicated  Social History:  Social History   Substance and Sexual Activity  Alcohol Use No     Social History   Substance and Sexual Activity  Drug Use No    Social History   Socioeconomic History   Marital status: Married    Spouse name: Not on file   Number of children: 4   Years of education: Not on file   Highest education level: Not on file  Occupational History   Occupation: Retired Optician, dispensing: RETIRED  Tobacco Use   Smoking status: Never   Smokeless tobacco: Never  Vaping Use   Vaping Use: Never used  Substance and Sexual Activity   Alcohol use: No   Drug use: No   Sexual activity: Not on file  Other Topics Concern   Not on file  Social History Narrative   Not on file   Social Determinants of Health   Financial Resource Strain: Not on file  Food Insecurity: Not on file  Transportation Needs: Not on file  Physical Activity: Not on file  Stress: Not on file  Social Connections: Not on file   Additional Social History:    Allergies:   Allergies  Allergen Reactions   Codeine Hives   Sulfonamide Derivatives Hives   Canagliflozin    Penicillins Hives    Pt states she does NOT have allergy to penicillin   Sulfa Antibiotics Other (See Comments)    unknown   Labs:  Results for orders placed or performed during the hospital encounter of 09/13/22 (from the past 48 hour(s))  Glucose, capillary     Status: Abnormal   Collection Time: 09/13/22  5:12 PM  Result Value Ref Range   Glucose-Capillary 149 (H) 70 - 99 mg/dL    Comment: Glucose reference range applies only to samples taken after fasting for at least 8 hours.  Glucose, capillary     Status: Abnormal   Collection Time: 09/13/22 10:19 PM  Result Value Ref Range   Glucose-Capillary 256 (H) 70 - 99 mg/dL     Comment: Glucose reference range applies only to samples taken after fasting for at least 8 hours.  Glucose, capillary     Status:  Abnormal   Collection Time: 09/14/22  1:48 AM  Result Value Ref Range   Glucose-Capillary 170 (H) 70 - 99 mg/dL    Comment: Glucose reference range applies only to samples taken after fasting for at least 8 hours.  Comprehensive metabolic panel     Status: Abnormal   Collection Time: 09/14/22  4:19 AM  Result Value Ref Range   Sodium 136 135 - 145 mmol/L   Potassium 3.0 (L) 3.5 - 5.1 mmol/L    Comment: DELTA CHECK NOTED   Chloride 104 98 - 111 mmol/L   CO2 27 22 - 32 mmol/L   Glucose, Bld 141 (H) 70 - 99 mg/dL    Comment: Glucose reference range applies only to samples taken after fasting for at least 8 hours.   BUN 12 8 - 23 mg/dL   Creatinine, Ser 0.74 0.44 - 1.00 mg/dL   Calcium 8.2 (L) 8.9 - 10.3 mg/dL   Total Protein 5.4 (L) 6.5 - 8.1 g/dL   Albumin 2.7 (L) 3.5 - 5.0 g/dL   AST 18 15 - 41 U/L   ALT 13 0 - 44 U/L   Alkaline Phosphatase 50 38 - 126 U/L   Total Bilirubin 0.6 0.3 - 1.2 mg/dL   GFR, Estimated >60 >60 mL/min    Comment: (NOTE) Calculated using the CKD-EPI Creatinine Equation (2021)    Anion gap 5 5 - 15    Comment: Performed at [facility], [address], [place], [place] [address]  Magnesium     Status: Abnormal   Collection Time: 09/14/22  4:19 AM  Result Value Ref Range   Magnesium 1.1 (L) 1.7 - 2.4 mg/dL    Comment: Performed at [facility], [address], [place], [place] [address]  CBC with Differential/Platelet     Status: Abnormal   Collection Time: 09/14/22  4:19 AM  Result Value Ref Range   WBC 11.4 (H) 4.0 - 10.5 K/uL   RBC 4.23 3.87 - 5.11 MIL/uL   Hemoglobin 9.1 (L) 12.0 - 15.0 g/dL   HCT 31.2 (L) 36.0 - 46.0 %   MCV 73.8 (L) 80.0 - 100.0 fL   MCH 21.5 (L) 26.0 - 34.0 pg   MCHC 29.2 (L) 30.0 - 36.0 g/dL   RDW 17.9 (H) 11.5 - 15.5 %   Platelets 352 150 - 400 K/uL   nRBC 0.0 0.0 - 0.2 %   Neutrophils Relative %  57 %   Neutro Abs 6.6 1.7 - 7.7 K/uL   Lymphocytes Relative 25 %   Lymphs Abs 2.8 0.7 - 4.0 K/uL   Monocytes Relative 12 %   Monocytes Absolute 1.4 (H) 0.1 - 1.0 K/uL   Eosinophils Relative 5 %   Eosinophils Absolute 0.5 0.0 - 0.5 K/uL   Basophils Relative 1 %   Basophils Absolute 0.1 0.0 - 0.1 K/uL   Immature Granulocytes 0 %   Abs Immature Granulocytes 0.05 0.00 - 0.07 K/uL    Comment: Performed at [facility], [address] [place], [place] [address]  Glucose, capillary     Status: None   Collection Time: 09/14/22  7:24 AM  Result Value Ref Range   Glucose-Capillary 90 70 - 99 mg/dL    Comment: Glucose reference range applies only to samples taken after fasting for at least 8 hours.  Glucose, capillary     Status: Abnormal   Collection Time: 09/14/22 11:53 AM  Result Value Ref Range   Glucose-Capillary 141 (H) 70 - 99 mg/dL  Comment: Glucose reference range applies only to samples taken after fasting for at least 8 hours.  Glucose, capillary     Status: Abnormal   Collection Time: 09/14/22  3:28 PM  Result Value Ref Range   Glucose-Capillary 121 (H) 70 - 99 mg/dL    Comment: Glucose reference range applies only to samples taken after fasting for at least 8 hours.  Glucose, capillary     Status: Abnormal   Collection Time: 09/14/22  9:41 PM  Result Value Ref Range   Glucose-Capillary 214 (H) 70 - 99 mg/dL    Comment: Glucose reference range applies only to samples taken after fasting for at least 8 hours.  CBC     Status: Abnormal   Collection Time: 09/15/22  5:59 AM  Result Value Ref Range   WBC 8.6 4.0 - 10.5 K/uL   RBC 4.31 3.87 - 5.11 MIL/uL   Hemoglobin 9.4 (L) 12.0 - 15.0 g/dL   HCT 31.8 (L) 36.0 - 46.0 %   MCV 73.8 (L) 80.0 - 100.0 fL   MCH 21.8 (L) 26.0 - 34.0 pg   MCHC 29.6 (L) 30.0 - 36.0 g/dL   RDW 17.9 (H) 11.5 - 15.5 %   Platelets 339 150 - 400 K/uL   nRBC 0.0 0.0 - 0.2 %    Comment: Performed at [facility], [address] [place], [place] [address]   Comprehensive metabolic panel     Status: Abnormal   Collection Time: 09/15/22  5:59 AM  Result Value Ref Range   Sodium 135 135 - 145 mmol/L   Potassium 4.2 3.5 - 5.1 mmol/L    Comment: DELTA CHECK NOTED   Chloride 103 98 - 111 mmol/L   CO2 27 22 - 32 mmol/L   Glucose, Bld 161 (H) 70 - 99 mg/dL    Comment: Glucose reference range applies only to samples taken after fasting for at least 8 hours.   BUN 10 8 - 23 mg/dL   Creatinine, Ser 0.69 0.44 - 1.00 mg/dL   Calcium 8.3 (L) 8.9 - 10.3 mg/dL   Total Protein 5.5 (L) 6.5 - 8.1 g/dL   Albumin 2.7 (L) 3.5 - 5.0 g/dL   AST 15 15 - 41 U/L   ALT 12 0 - 44 U/L   Alkaline Phosphatase 49 38 - 126 U/L   Total Bilirubin 0.4 0.3 - 1.2 mg/dL   GFR, Estimated >60 >60 mL/min    Comment: (NOTE) Calculated using the CKD-EPI Creatinine Equation (2021)    Anion gap 5 5 - 15    Comment: Performed at [facility], [address], [place], [place] [address]  Glucose, capillary     Status: Abnormal   Collection Time: 09/15/22  7:55 AM  Result Value Ref Range   Glucose-Capillary 128 (H) 70 - 99 mg/dL    Comment: Glucose reference range applies only to samples taken after fasting for at least 8 hours.  Glucose, capillary     Status: Abnormal   Collection Time: 09/15/22 11:46 AM  Result Value Ref Range   Glucose-Capillary 247 (H) 70 - 99 mg/dL    Comment: Glucose reference range applies only to samples taken after fasting for at least 8 hours.    Medications:  Current Facility-Administered Medications  Medication Dose Route Frequency Provider Last Rate Last Admin   acetaminophen (TYLENOL) tablet 650 mg  650 mg Oral Q6H PRN Zierle-Ghosh, Asia B, DO       Or   acetaminophen (TYLENOL) suppository 650 mg  650 mg Rectal Q6H PRN Zierle-Ghosh, Asia B, DO       ALPRAZolam (XANAX) tablet 0.5 mg  0.5 mg Oral TID PRN Deatra James, MD       aspirin EC tablet 81 mg  81 mg Oral q morning Zierle-Ghosh, Asia B, DO   81 mg at 09/15/22 0937   atorvastatin  (LIPITOR) tablet 20 mg  20 mg Oral Daily Shahmehdi, Seyed A, MD   20 mg at 09/15/22 0937   ceFEPIme (MAXIPIME) 2 g in sodium chloride 0.9 % 100 mL IVPB  2 g Intravenous Q12H Deatra James, MD   Stopped at 09/15/22 0540   Chlorhexidine Gluconate Cloth 2 % PADS 6 each  6 each Topical Q0600 Deatra James, MD   6 each at 09/15/22 0511   doxepin (SINEQUAN) capsule 10 mg  10 mg Oral QHS Shahmehdi, Seyed A, MD   10 mg at 09/14/22 2144   DULoxetine (CYMBALTA) DR capsule 60 mg  60 mg Oral Daily Zierle-Ghosh, Asia B, DO   60 mg at 09/15/22 7867   FLUoxetine (PROZAC) capsule 40 mg  40 mg Oral Daily Shahmehdi, Seyed A, MD   40 mg at 09/15/22 6720   heparin injection 5,000 Units  5,000 Units Subcutaneous Q8H Zierle-Ghosh, Asia B, DO   5,000 Units at 09/15/22 1508   insulin aspart (novoLOG) injection 0-15 Units  0-15 Units Subcutaneous TID WC Zierle-Ghosh, Asia B, DO   5 Units at 09/15/22 1209   insulin aspart (novoLOG) injection 0-5 Units  0-5 Units Subcutaneous QHS Zierle-Ghosh, Asia B, DO   2 Units at 09/14/22 2142   insulin detemir (LEVEMIR) injection 15 Units  15 Units Subcutaneous QHS Zierle-Ghosh, Asia B, DO   15 Units at 09/14/22 2144   lactated ringers 1,000 mL with potassium chloride 10 mEq infusion   Intravenous Continuous Shahmehdi, Seyed A, MD 100 mL/hr at 09/15/22 1200 Infusion Verify at 09/15/22 1200   levothyroxine (SYNTHROID) tablet 150 mcg  150 mcg Oral Q0600 Zierle-Ghosh, Asia B, DO   150 mcg at 09/15/22 0506   metoprolol succinate (TOPROL-XL) 24 hr tablet 50 mg  50 mg Oral Daily Shahmehdi, Seyed A, MD   50 mg at 09/15/22 0938   ondansetron (ZOFRAN) tablet 4 mg  4 mg Oral Q6H PRN Zierle-Ghosh, Asia B, DO       Or   ondansetron (ZOFRAN) injection 4 mg  4 mg Intravenous Q6H PRN Zierle-Ghosh, Asia B, DO       oxyCODONE (Oxy IR/ROXICODONE) immediate release tablet 5 mg  5 mg Oral Q4H PRN Zierle-Ghosh, Asia B, DO       polyethylene glycol (MIRALAX / GLYCOLAX) packet 17 g  17 g Oral Daily  Zierle-Ghosh, Asia B, DO   17 g at 09/15/22 9470   senna-docusate (Senokot-S) tablet 2 tablet  2 tablet Oral BID Deatra James, MD   2 tablet at 09/15/22 9628   Musculoskeletal: Strength & Muscle Tone: within normal limits Gait & Station: normal Patient leans: N/A  Psychiatric Specialty Exam:  Presentation  General Appearance:  Appropriate for Environment; Casual; Fairly Groomed  Eye Contact: Good  Speech: Clear and Coherent; Normal Rate  Speech Volume: Normal  Handedness: Right  Mood and Affect  Mood: Euthymic  Affect: Appropriate; Congruent  Thought Process  Thought Processes: Linear  Descriptions of Associations:Intact  Orientation:Full (Time, Place and Person)  Thought Content:Logical  History of Schizophrenia/Schizoaffective disorder:No data recorded Duration of Psychotic Symptoms:No data recorded Hallucinations:Hallucinations: None  Ideas of  Reference:None  Suicidal Thoughts:Suicidal Thoughts: No  Homicidal Thoughts:Homicidal Thoughts: No  Sensorium  Memory: Immediate Fair; Recent Fair; Remote Fair  Judgment: Fair  Insight: Fair  Community education officer  Concentration: Fair  Attention Span: Fair  Recall: [employer] of Knowledge: Fair  Language: Good  Psychomotor Activity  Psychomotor Activity: Psychomotor Activity: Normal  Assets  Assets: Communication Skills; Physical Health  Sleep  Sleep: Sleep: Good Number of Hours of Sleep: 8  Physical Exam: Physical Exam Vitals and nursing note reviewed.   Review of Systems  Constitutional: Negative.   HENT:  Positive for hearing loss (Severe hard of hearing).   Eyes: Negative.   Respiratory: Negative.    Cardiovascular: Negative.        Blood pressure 114/57, pulse 67, respiration 22.  Nursing staff to recheck vital signs  Gastrointestinal: Negative.   Genitourinary: Negative.   Musculoskeletal: Negative.   Skin: Negative.   Neurological: Negative.    Endo/Heme/Allergies:                Reaction Severity Reaction Type Noted       Allergies    Codeine  Hives Medium Allergy 04/27/2009    Sulfonamide Derivatives  Hives Medium Allergy 04/27/2009    Canagliflozin   Not Specified  12/20/2020    Penicillins  Hives Not Specified  12/20/2020 Pt states she does NOT have allergy to penicillin   Sulfa Antibiotics  Other (See Comments) Not Specified  05/06/2014    Psychiatric/Behavioral:  Positive for depression. The patient has insomnia.    Blood pressure (!) 114/57, pulse 67, temperature 98.3 F (36.8 C), temperature source Oral, resp. rate (!) 22, height '5\' 5"'$  (1.651 m), weight 72.4 kg, SpO2 94 %. Body mass index is 26.56 kg/m.  Treatment Plan Summary: Daily contact with patient to assess and evaluate symptoms and progress in treatment and Medication management  Plan: Major depressive disorder: --Discontinue duloxetine (Cymbalta) 60 mg p.o. daily. --Continue fluoxetine (Prozac) 40 mg p.o. daily -- Continue doxepin Sinequan 10 mg po at bedtime.  Insomnia: -- Continue melatonin 10 mg tabs p.o. at bedtime  Anxiety: -- Continue doxepin (Sinequan) 10 mg capsule at bedtime for anxiety or insomnia -- Continue alprazolam (Xanax) tablets 0.5 mg p.o. 3 times daily as needed  Disposition: No evidence of imminent risk to self or others at present.    This service was provided via telemedicine using a 2-way, interactive audio and video technology.  Names of all persons participating in this telemedicine service and their role in this encounter. Name: Otilio Carpen Role: Patient  Name: Garrison [place] Role: NP provider  Name: Dr. Dwyane Dee Role: Medical Director  Name: Dr. Stark Jock Role: Forestine Na, ED physician    Laretta Bolster, [place] 09/15/2022 3:55 PM

## 2022-09-15 NOTE — Progress Notes (Signed)
PROGRESS NOTE    Patient: Jeanne Kim                            PCP: [name], NP                    DOB: 10-03-1941            DOA: 09/13/2022 [id number]:[id number]             DOS: 09/15/2022, 11:44 AM   LOS: 2 days   Date of Service: The patient was seen and examined on 09/15/2022  Subjective:   Jeanne Kim was seen and examined this morning, much more awake alert, following commands Hemodynamically stable Reporting of successful bowel movement yesterday No issues overnight  Brief Narrative:    Jeanne Kim is a 81 y.o. female with medical history significant of anginal pain, carotid artery plaque, coronary artery disease, depression, GERD, fibromyalgia, hyperlipidemia, hypertension, hypothyroidism, sleep apnea, type 2 diabetes mellitus, and more presents the ED with a chief complaint of "I am dying."  Patient seems confused but has not recently received any medications that should cause her to be confused.  She reports that she is here because she was dying.  After reassurance, she changes her story to chest pain.  She reports that her heart muscle is giving out, which does not appear to have been discussed during this hospitalization so far.  According to chart review patient's husband called EMS because patient was hypoglycemic and lethargic.  Patient had GCS of 14 at presentation, and she had reported to the nurse that she was had slurred speech and felt confused all day.     Patient was discharged in August after similar presentation.  She did had generalized weakness for a week at that time and decreased oral intake with decreased urine output at that time.  She was noted to be a poor historian at that time as well due to cognitive impairment and acute encephalopathy.  Initially she was treated for sepsis, but it was ruled out. Her blood pressure remained soft and she was started on midodrine.  Patient was discharged home without any antihypertensives and with a plan to wean  off midodrine.   ED Temp 94, heart rate 49-83, respiratory rate 13-21, blood pressure 59/33-98/65, satting 96% Leukocytosis at 14.8, hemoglobin 9.5 Hyponatremia 131, hypokalemia 3.1, hyperglycemia 314 Albumin 3.4 Lactic acidosis 4.5, 3.8 Tropes 3, 3 UA is not indicative of UTI Blood culture pending CT renal stone study shows constipation Chest x-ray shows low lung volumes and mild atelectasis at the lung bases Patient was started on vancomycin and cefepime for empiric treatment of possible sepsis Normal saline 2-1/2 L bolus given EKG shows a heart rate of 50, sinus rhythm, QTc 479  Admission requested for hypotension, SIRS.      Assessment & Plan:   Principal Problem:   SIRS (systemic inflammatory response syndrome) (HCC) Active Problems:   Acute metabolic encephalopathy   Hypothyroidism   Essential hypertension   CAD (coronary artery disease)   Chest pain   Uncontrolled type 2 diabetes mellitus with hyperglycemia, with long-term current use of insulin (HCC)   Electrolyte abnormality   Hypokalemia   Chronic constipation     Assessment and Plan: * SIRS (systemic inflammatory response syndrome) ([facility]) -Patient met SIRS criteria on admission with leukocytosis of 14.8, hypotension, lactic acidosis with no endorgan damage - No source of infection has been identified -  Sepsis ruled out, still meeting SIRS criteria  Vitals today> Blood pressure 131/63, pulse 65, temperature 97.8 F (36.6 C), temperature source Oral, resp. rate 14, height _0  (1.651 m), weight 72.4 kg, SpO2 96 %.  Lactic acid 3.8 >> 1.8 Procalcitonin-- <0.10  WBC 14.8>> 11.4 - was Covered with vancomycin and cefepime- D/Ced Vanc, continue cefepime - UA is not indicative of UTI - No signs of infection on CT renal study - Chest x-ray shows no low lung volumes with mild atelectasis at the lung bases - Blood cultures >>> no growth to date  - Continue IVF --- reducing rate  Acute metabolic  encephalopathy -Much improved back to baseline  -Improved hypotension -Improved sepsis physiology  - Consistent with previous presentation - No focal deficits on exam - Continue to monitor  Chronic constipation - Again demonstrated on CT -Reported bowel movement with current regiment - On MiraLAX, added Senokot, and one-time mag citrate today  Hypokalemia - Resolved, repleted p.o.  Electrolyte abnormality - With hypokalemia and hyponatremia as well as hypochloremia -- K+ 3.1 >>3.0 >>> 4.2 now  -Potassium and magnesium were repleted   - Hold Lasix - Will consider holding Cymbalta if electrolytes do not improve -  Hyponatremia and hypotension -improved with IV fluids - Continue to monitor  Uncontrolled type 2 diabetes mellitus with hyperglycemia, with long-term current use of insulin ([place]) - Patient is on 20 units of basal insulin at home - Continue reduced dose of basal insulin -A1c 10.4 on 05/17/2022 - Continue sliding scale coverage - Hemoglobin A1c  Continue to monitor CBG (last 3)  Recent Labs    09/14/22 1528 09/14/22 2141 09/15/22 0755  GLUCAP 121* 214* 128*     Chest pain - Currently denies of having any chest pain - Troponin 3, 3 - EKG without acute ST segment changes - Discontinue telemetry  CAD (coronary artery disease) - Denies chest pain, shortness of breath - EKG is without acute ST changes - Discontinue telemetry  -Heart cath in 2006 showing non-obstructive CAD.  -Stress test in 2017 negative.  -Hx of atypical CP, but no recent use of Nitro.    -Continue home Metoprolol, Norvasc, statin, ASA.  Essential hypertension - Hypotension-resolved - Hypertensive now, discontinued Levophed, discontinued PO  midodrine - Holding home antihypertensive meds for now   Hypothyroidism - Continue Synthroid - TSH: 0.565   Transferring out of ICU today 09/15/2022     ---------------------------------------------------------------------------------------------------------------------------------------------- Nutritional status:  The patient's BMI is: Body mass index is 26.56 kg/m. I agree with the assessment and plan as outlined ---------------------------------------------------------------------------------------------------------------------------------------------------- Cultures; Blood Cultures x 2 >> NGT Urine Culture  >>> NGT  Sputum Culture >> NGT    ------------------------------------------------------------------------------------------------------------------------------------------------  DVT prophylaxis:  heparin injection 5,000 Units Start: 09/13/22 2200 SCDs Start: 09/13/22 1354SCDs   Code Status:   Code Status: Full Code  Family Communication: Husband present at bedside, updated The above findings and plan of care has been discussed with patient (and family)  in detail,  they expressed understanding and agreement of above. -Advance care planning has been discussed.   Admission status:   Status is: Inpatient Remains inpatient appropriate because: Needing aggressive IV fluids, IV antibiotics, ruling out sepsis     Procedures:   No admission procedures for hospital encounter.   Antimicrobials:  Anti-infectives (From admission, onward)    Start     Dose/Rate Route Frequency Ordered Stop   09/14/22 0600  vancomycin (VANCOREADY) IVPB 1250 mg/250 mL  Status:  Discontinued  1,250 mg 166.7 mL/hr over 90 Minutes Intravenous Every 24 hours 09/13/22 1713 09/14/22 1403   09/13/22 1800  ceFEPIme (MAXIPIME) 2 g in sodium chloride 0.9 % 100 mL IVPB        2 g 200 mL/hr over 30 Minutes Intravenous Every 12 hours 09/13/22 1711     09/13/22 0515  vancomycin (VANCOCIN) IVPB 1000 mg/200 mL premix        1,000 mg 200 mL/hr over 60 Minutes Intravenous  Once 09/13/22 0509 09/13/22 0636   09/13/22 0515  ceFEPIme (MAXIPIME) 2 g  in sodium chloride 0.9 % 100 mL IVPB        2 g 200 mL/hr over 30 Minutes Intravenous  Once 09/13/22 0509 09/13/22 0636        Medication:   aspirin EC  81 mg Oral q morning   atorvastatin  20 mg Oral Daily   Chlorhexidine Gluconate Cloth  6 each Topical Q0600   doxepin  10 mg Oral QHS   DULoxetine  60 mg Oral Daily   FLUoxetine  40 mg Oral Daily   heparin  5,000 Units Subcutaneous Q8H   insulin aspart  0-15 Units Subcutaneous TID WC   insulin aspart  0-5 Units Subcutaneous QHS   insulin detemir  15 Units Subcutaneous QHS   levothyroxine  150 mcg Oral Q0600   metoprolol succinate  50 mg Oral Daily   polyethylene glycol  17 g Oral Daily   senna-docusate  2 tablet Oral BID       Objective:   Vitals:   09/15/22 0507 09/15/22 0700 09/15/22 0800 09/15/22 0828  BP:  (!) 121/56 131/63   Pulse:  61 65   Resp:  18 14   Temp:    97.8 F (36.6 C)  TempSrc:    Oral  SpO2:  95% 96%   Weight: 72.4 kg     Height:        Intake/Output Summary (Last 24 hours) at 09/15/2022 1144 Last data filed at 09/15/2022 0800 Gross per 24 hour  Intake 2075.76 ml  Output 3800 ml  Net -1724.24 ml   Filed Weights   09/13/22 1235 09/14/22 0500 09/15/22 0507  Weight: 70.8 kg 71.3 kg 72.4 kg     Examination:       General:  AAO x 3,  cooperative, no distress;   HEENT:  Normocephalic, PERRL, otherwise with in Normal limits   Neuro:  CNII-XII intact. , normal motor and sensation, reflexes intact   Lungs:   Clear to auscultation BL, Respirations unlabored,  No wheezes / crackles  Cardio:    S1/S2, RRR, No murmure, No Rubs or Gallops   Abdomen:  Soft, non-tender, bowel sounds active all four quadrants, no guarding or peritoneal signs.  Muscular  skeletal:  Limited exam -global generalized weaknesses - in bed, able to move all 4 extremities,   2+ pulses,  symmetric, No pitting edema  Skin:  Dry, warm to touch, negative for any Rashes,  Wounds: Please see nursing documentation              ------------------------------------------------------------------------------------------------------------------------------------------    LABs:     Latest Ref Rng & Units 09/15/2022    5:59 AM 09/14/2022    4:19 AM 09/13/2022   12:29 AM  CBC  WBC 4.0 - 10.5 K/uL 8.6  11.4  14.8   Hemoglobin 12.0 - 15.0 g/dL 9.4  9.1  9.5   Hematocrit 36.0 - 46.0 % 31.8  31.2  32.5  Platelets 150 - 400 K/uL 339  352  404       Latest Ref Rng & Units 09/15/2022    5:59 AM 09/14/2022    4:19 AM 09/13/2022    8:23 AM  CMP  Glucose 70 - 99 mg/dL 161  141  192   BUN 8 - 23 mg/dL _0 Creatinine 0.44 - 1.00 mg/dL 0.69  0.74  0.84   Sodium 135 - 145 mmol/L 135  136  135   Potassium 3.5 - 5.1 mmol/L 4.2  3.0  4.4   Chloride 98 - 111 mmol/L 103  104  103   CO2 22 - 32 mmol/L _1 Calcium 8.9 - 10.3 mg/dL 8.3  8.2  8.2   Total Protein 6.5 - 8.1 g/dL 5.5  5.4  6.5   Total Bilirubin 0.3 - 1.2 mg/dL 0.4  0.6  1.0   Alkaline Phos 38 - 126 U/L 49  50  57   AST 15 - 41 U/L _2 ALT 0 - 44 U/L _3 Micro Results Recent Results (from the past 240 hour(s))  Urine Culture     Status: None   Collection Time: 09/13/22 12:22 AM   Specimen: Urine, Clean Catch  Result Value Ref Range Status   Specimen Description   Final    URINE, CLEAN CATCH Performed at [facility], [address], [place], [place] [address]    Special Requests   Final    NONE Performed at [facility], [address], [place], [place] [address]    Culture   Final    NO GROWTH Performed at [facility], [place] [address] [place], [place] [address]    Report Status 09/14/2022 FINAL  Final  Blood Culture (routine x 2)     Status: None (Preliminary result)   Collection Time: 09/13/22 12:29 AM   Specimen: BLOOD  Result Value Ref Range Status   Specimen Description BLOOD BLOOD RIGHT ARM  Final   Special Requests   Final    BOTTLES DRAWN AEROBIC AND ANAEROBIC Blood Culture adequate  volume   Culture   Final    NO GROWTH 2 DAYS Performed at [facility], [address], [place],  [address]    Report Status PENDING  Incomplete  Blood Culture (routine x 2)     Status: None (Preliminary result)   Collection Time: 09/13/22 12:35 AM   Specimen: BLOOD  Result Value Ref Range Status   Specimen Description BLOOD BLOOD RIGHT HAND  Final   Special Requests   Final    BOTTLES DRAWN AEROBIC AND ANAEROBIC Blood Culture adequate volume   Culture   Final    NO GROWTH 2 DAYS Performed at [facility], [address] [place],  [address]    Report Status PENDING  Incomplete  MRSA Next Gen by PCR, Nasal     Status: None   Collection Time: 09/13/22 12:33 PM   Specimen: Nasal Mucosa; Nasal Swab  Result Value Ref Range Status   MRSA by PCR Next Gen NOT DETECTED NOT DETECTED Final    Comment: (NOTE) The GeneXpert MRSA Assay (FDA approved for NASAL specimens only), is one component of a comprehensive MRSA colonization surveillance program. It is not intended to diagnose MRSA infection nor to guide or monitor treatment for MRSA infections. Test performance is not  FDA approved in patients less than 6 years old. Performed at [facility], [address], [place],  [place] [address]     Radiology Reports No results found.  SIGNED: Deatra James, MD, FHM. > 57 min.  Of critical time was spent seeing and evaluating this patient, drawn plan of care, reviewing all electronic medical records discussing with medical staff   [facility],  Pager (please use [email or web address] to page/text) Please use Epic Secure Chat for non-urgent communication (7AM-7PM)  If 7PM-7AM, please contact night-coverage [email or web address], 09/15/2022, 11:44 AM

## 2022-09-16 DIAGNOSIS — G9349 Other encephalopathy: Secondary | ICD-10-CM

## 2022-09-16 DIAGNOSIS — E119 Type 2 diabetes mellitus without complications: Secondary | ICD-10-CM

## 2022-09-16 LAB — COMPREHENSIVE METABOLIC PANEL
ALT: 12 U/L (ref 0–44)
AST: 15 U/L (ref 15–41)
Albumin: 2.9 g/dL — ABNORMAL LOW (ref 3.5–5.0)
Alkaline Phosphatase: 52 U/L (ref 38–126)
Anion gap: 6 (ref 5–15)
BUN: 13 mg/dL (ref 8–23)
CO2: 27 mmol/L (ref 22–32)
Calcium: 8.6 mg/dL — ABNORMAL LOW (ref 8.9–10.3)
Chloride: 102 mmol/L (ref 98–111)
Creatinine, Ser: 0.62 mg/dL (ref 0.44–1.00)
GFR, Estimated: >60 mL/min (ref >60)
Glucose, Bld: 208 mg/dL — ABNORMAL HIGH (ref 70–99)
Potassium: 4 mmol/L (ref 3.5–5.1)
Sodium: 135 mmol/L (ref 135–145)
Total Bilirubin: 0.5 mg/dL (ref 0.3–1.2)
Total Protein: 5.7 g/dL — ABNORMAL LOW (ref 6.5–8.1)

## 2022-09-16 LAB — GLUCOSE, CAPILLARY
Glucose-Capillary: 157 mg/dL — ABNORMAL HIGH (ref 70–99)
Glucose-Capillary: 232 mg/dL — ABNORMAL HIGH (ref 70–99)

## 2022-09-16 LAB — CBC
HCT: 32.5 % — ABNORMAL LOW (ref 36.0–46.0)
Hemoglobin: 9.5 g/dL — ABNORMAL LOW (ref 12.0–15.0)
MCH: 21.4 pg — ABNORMAL LOW (ref 26.0–34.0)
MCHC: 29.2 g/dL — ABNORMAL LOW (ref 30.0–36.0)
MCV: 73.4 fL — ABNORMAL LOW (ref 80.0–100.0)
Platelets: 343 10*3/uL (ref 150–400)
RBC: 4.43 MIL/uL (ref 3.87–5.11)
RDW: 17.8 % — ABNORMAL HIGH (ref 11.5–15.5)
WBC: 10.7 10*3/uL — ABNORMAL HIGH (ref 4.0–10.5)
nRBC: 0 % (ref 0.0–0.2)

## 2022-09-16 MED ORDER — POTASSIUM CHLORIDE CRYS ER 20 MEQ PO TBCR: 20.0000 meq | EXTENDED_RELEASE_TABLET | Freq: Every day | ORAL | 0 refills | Status: AC

## 2022-09-16 MED ORDER — LEVOFLOXACIN 500 MG PO TABS: 500.0000 mg | ORAL_TABLET | Freq: Every day | ORAL | 0 refills | Status: AC

## 2022-09-16 NOTE — Progress Notes (Signed)
Patient transferred to the floor from ICU at around 2130 pm. Patient slept most of the night during this shift. No prn medications given.

## 2022-09-16 NOTE — Discharge Summary (Signed)
Physician Discharge Summary   Patient: Jeanne Kim MRN: [id number] DOB: 1941-01-30  Admit date:     09/13/2022  Discharge date: 09/16/22  Discharge Physician: Deatra James   PCP: [name], NP   Recommendations at discharge:   Monitor your diet closely, carb modified diet. Monitor your blood sugar closely Follow-up with PCP within 1-2 weeks Note to PCP, discontinued metformin -due to lactic acidosis-suggesting titrating insulin up for better glycemic control  Discharge Diagnoses: Principal Problem:   SIRS (systemic inflammatory response syndrome) (HCC) Active Problems:   Acute metabolic encephalopathy   Hypothyroidism   Essential hypertension   CAD (coronary artery disease)   Chest pain   Uncontrolled type 2 diabetes mellitus with hyperglycemia, with long-term current use of insulin (HCC)   Electrolyte abnormality   Hypokalemia   Chronic constipation  Resolved Problems:   * No resolved hospital problems. *  Hospital Course:  Jeanne Kim is a 81 y.o. female with medical history significant of anginal pain, carotid artery plaque, coronary artery disease, depression, GERD, fibromyalgia, hyperlipidemia, hypertension, hypothyroidism, sleep apnea, type 2 diabetes mellitus, and more presents the ED with a chief complaint of "I am dying."  Patient seems confused but has not recently received any medications that should cause her to be confused.  She reports that she is here because she was dying.  After reassurance, she changes her story to chest pain.  She reports that her heart muscle is giving out, which does not appear to have been discussed during this hospitalization so far.  According to chart review patient's husband called EMS because patient was hypoglycemic and lethargic.  Patient had GCS of 14 at presentation, and she had reported to the nurse that she was had slurred speech and felt confused all day.     Patient was discharged in August after similar  presentation.  She did had generalized weakness for a week at that time and decreased oral intake with decreased urine output at that time.  She was noted to be a poor historian at that time as well due to cognitive impairment and acute encephalopathy.  Initially she was treated for sepsis, but it was ruled out. Her blood pressure remained soft and she was started on midodrine.  Patient was discharged home without any antihypertensives and with a plan to wean off midodrine.   ED Temp 94, heart rate 49-83, respiratory rate 13-21, blood pressure 59/33-98/65, satting 96% Leukocytosis at 14.8, hemoglobin 9.5 Hyponatremia 131, hypokalemia 3.1, hyperglycemia 314 Albumin 3.4 Lactic acidosis 4.5, 3.8 Tropes 3, 3 UA is not indicative of UTI Blood culture pending CT renal stone study shows constipation Chest x-ray shows low lung volumes and mild atelectasis at the lung bases Patient was started on vancomycin and cefepime for empiric treatment of possible sepsis Normal saline 2-1/2 L bolus given EKG shows a heart rate of 50, sinus rhythm, QTc 479  Admission requested for hypotension, SIRS.    Assessment and Plan: * SIRS (systemic inflammatory response syndrome) (HCC) -Resolved -Patient met SIRS criteria on admission with leukocytosis of 14.8, hypotension, lactic acidosis with no endorgan damage - No source of infection has been identified -Sepsis ruled out, still meeting SIRS criteria  Vitals today Blood pressure 114/68, pulse 63, temperature 98 F (36.7 C), temperature source Oral, resp. rate 20, height _0  (1.651 m), weight 70.6 kg, SpO2 95 %.   Lactic acid 3.8 >> 1.8 Procalcitonin-- <0.10  WBC 14.8>> 11.4 - was Covered with vancomycin and cefepime- D/Ced  Vanc, continue cefepime - UA is not indicative of UTI - No signs of infection on CT renal study - Chest x-ray shows no low lung volumes with mild atelectasis at the lung bases - Blood cultures >>> no growth to date  - Continue IVF  --- discontinued  Acute metabolic encephalopathy -Resolved back to baseline  -Improved hypotension -Improved sepsis physiology  - Consistent with previous presentation - No focal deficits on exam   Chronic constipation - Again demonstrated on CT -Reported bowel movement with current regiment - On MiraLAX, added Senokot, and one-time mag citrate today  Hypokalemia - Resolved, repleted p.o.  Electrolyte abnormality - With hypokalemia and hyponatremia as well as hypochloremia -- K+ 3.1 >>3.0 >>> 4.2 now  -Potassium and magnesium were repleted   - Hold Lasix >>> resume Lasix at 20 mg daily, will supplement potassium 20 mill equivalent daily - Discontinued Cymbalta  -  Hyponatremia and hypotension -improved with IV fluids   Uncontrolled type 2 diabetes mellitus with hyperglycemia, with long-term current use of insulin ([place]) - Patient is on 20 units of basal insulin at home -Resume home regimen of insulin -Discontinued metformin >>> as patient presented with lactic acidosis, with no significant source - Continue reduced dose of basal insulin -A1c 10.4 on 05/17/2022 - Continue sliding scale coverage CBG (last 3)  Recent Labs    09/15/22 1655 09/15/22 2102 09/16/22 0725  GLUCAP 210* 175* 157*     Chest pain - Currently denies of having any chest pain - Troponin 3, 3 - EKG without acute ST segment changes - Discontinue telemetry  CAD (coronary artery disease) - Denies chest pain, shortness of breath - EKG is without acute ST changes - Discontinue telemetry  -Heart cath in 2006 showing non-obstructive CAD.  -Stress test in 2017 negative.  -Hx of atypical CP, but no recent use of Nitro.    -Continue home Metoprolol, Norvasc, statin, ASA.  Essential hypertension - Hypotension-resolved - Hypertensive now, discontinued Levophed, discontinued PO  midodrine - Discontinued ramipril (due to hypotension, electrolyte abnormality)   Hypothyroidism - Continue  Synthroid - TSH: 0.565    Disposition: Home Diet recommendation:  Discharge Diet Orders (From admission, onward)     Start     Ordered   09/16/22 0000  Diet - low sodium heart healthy        09/16/22 0902   09/16/22 0000  Diet Carb Modified        09/16/22 0902           Carb modified diet DISCHARGE MEDICATION: Allergies as of 09/16/2022       Reactions   Codeine Hives   Sulfonamide Derivatives Hives   Canagliflozin    Penicillins Hives   Pt states she does NOT have allergy to penicillin   Sulfa Antibiotics Other (See Comments)   unknown        Medication List     STOP taking these medications    ciprofloxacin 500 MG tablet Commonly known as: CIPRO   DULoxetine 60 MG capsule Commonly known as: CYMBALTA   ketoconazole 2 % cream Commonly known as: NIZORAL   linaclotide 290 MCG Caps capsule Commonly known as: LINZESS   metFORMIN 1000 MG tablet Commonly known as: GLUCOPHAGE   midodrine 2.5 MG tablet Commonly known as: PROAMATINE   pantoprazole 40 MG tablet Commonly known as: PROTONIX   ramipril 5 MG capsule Commonly known as: ALTACE       TAKE these medications    amLODipine 5 MG tablet  Commonly known as: NORVASC Take 5 mg by mouth daily.   Aspirin Low Dose 81 MG tablet Generic drug: aspirin EC Take 81 mg by mouth every morning.   atorvastatin 20 MG tablet Commonly known as: LIPITOR Take 20 mg by mouth daily.   doxepin 10 MG capsule Commonly known as: SINEQUAN Take 10 mg by mouth at bedtime.   EPINEPHrine 0.3 mg/0.3 mL Soaj injection Commonly known as: EPI-PEN Inject 0.3 mg into the muscle daily as needed for anaphylaxis.   FLUoxetine 40 MG capsule Commonly known as: PROZAC Take 40 mg by mouth daily.   furosemide 20 MG tablet Commonly known as: LASIX Take 1 tablet (20 mg total) by mouth daily.   Jardiance 25 MG Tabs tablet Generic drug: empagliflozin Take 1 tablet (25 mg total) by mouth daily.   levofloxacin 500 MG  tablet Commonly known as: Levaquin Take 1 tablet (500 mg total) by mouth daily for 3 days.   levothyroxine 150 MCG tablet Commonly known as: SYNTHROID Take 1 tablet (150 mcg total) by mouth daily at 6 (six) AM.   Melatonin 10 MG Tabs Take 10 mg by mouth at bedtime as needed (sleep).   metoprolol succinate 50 MG 24 hr tablet Commonly known as: TOPROL-XL Take 50 mg by mouth daily.   potassium chloride SA 20 MEQ tablet Commonly known as: KLOR-CON M Take 1 tablet (20 mEq total) by mouth daily. What changed:  how much to take when to take this   Soliqua 100-33 UNT-MCG/ML Sopn Generic drug: Insulin Glargine-Lixisenatide Inject 20 Units into the skin daily. Sliding Scale        Discharge Exam: Filed Weights   09/15/22 0507 09/15/22 2126 09/16/22 0534  Weight: 72.4 kg 71.4 kg 70.6 kg      Physical Exam:   General:  AAO x 3,  cooperative, no distress;   HEENT:  Normocephalic, PERRL, otherwise with in Normal limits   Neuro:  CNII-XII intact. , normal motor and sensation, reflexes intact   Lungs:   Clear to auscultation BL, Respirations unlabored,  No wheezes / crackles  Cardio:    S1/S2, RRR, No murmure, No Rubs or Gallops   Abdomen:  Soft, non-tender, bowel sounds active all four quadrants, no guarding or peritoneal signs.  Muscular  skeletal:  Limited exam -global generalized weaknesses - in bed, able to move all 4 extremities,   2+ pulses,  symmetric, No pitting edema  Skin:  Dry, warm to touch, negative for any Rashes,  Wounds: Please see nursing documentation          Condition at discharge: good  The results of significant diagnostics from this hospitalization (including imaging, microbiology, ancillary and laboratory) are listed below for reference.   Imaging Studies: CT Renal Stone Study  Result Date: 09/13/2022 CLINICAL DATA:  Abdominal/flank pain, stone suspected. EXAM: CT ABDOMEN AND PELVIS WITHOUT CONTRAST TECHNIQUE: Multidetector CT imaging of the  abdomen and pelvis was performed following the standard protocol without IV contrast. RADIATION DOSE REDUCTION: This exam was performed according to the departmental dose-optimization program which includes automated exposure control, adjustment of the mA and/or kV according to patient size and/or use of iterative reconstruction technique. COMPARISON:  05/16/2022. FINDINGS: Lower chest: Scattered coronary artery calcifications are noted. Atelectasis is noted at the lung bases. Hepatobiliary: Scattered subcentimeter hypodensities are present in the liver which are too small to further characterize. No biliary ductal dilatation. The gallbladder is not seen on exam. Pancreas: Pancreatic atrophy. No pancreatic ductal dilatation or surrounding inflammatory  changes. Spleen: Normal in size without focal abnormality. Adrenals/Urinary Tract: The adrenal glands are within normal limits. No renal calculus bilaterally. No definite ureteral calculus or obstructive uropathy. A small amount of air is present in the anterior aspect of the urinary bladder, which may be iatrogenic. Stomach/Bowel: There is a moderate hiatal hernia. Stomach is otherwise within normal limits. Appendix appears normal. No evidence of bowel wall thickening, distention, or inflammatory changes. No free air or pneumatosis. A large amount of retained stool is present in the colon. Vascular/Lymphatic: Aortic atherosclerosis. No enlarged abdominal or pelvic lymph nodes. Reproductive: Status post hysterectomy. No adnexal masses. Other: No abdominopelvic ascites. Musculoskeletal: Degenerative changes are present in the thoracolumbar spine. No acute or suspicious osseous abnormality. Total hip arthroplasty changes are noted on the left. IMPRESSION: 1. No renal or ureteral calculus or obstructive uropathy bilaterally. 2. Large amount of retained stool in the colon suggesting constipation. 3. Moderate hiatal hernia. 4. Aortic atherosclerosis. Electronically Signed    By: Brett Fairy M.D.   On: 09/13/2022 03:25   DG Chest Port 1 View  Result Date: 09/13/2022 CLINICAL DATA:  Possible sepsis.  Hyperglycemia and lethargy. EXAM: PORTABLE CHEST 1 VIEW COMPARISON:  05/15/2022. FINDINGS: The heart size and mediastinal contours are within normal limits. There is atherosclerotic calcification of the aorta. Lung volumes are low with mild atelectasis at the lung bases. No consolidation, effusion, or pneumothorax. Cervical spinal fusion hardware is noted. Degenerative changes are present in the thoracic spine. IMPRESSION: Low lung volumes with mild atelectasis at the lung bases. Electronically Signed   By: Brett Fairy M.D.   On: 09/13/2022 01:07    Microbiology: Results for orders placed or performed during the hospital encounter of 09/13/22  Urine Culture     Status: None   Collection Time: 09/13/22 12:22 AM   Specimen: Urine, Clean Catch  Result Value Ref Range Status   Specimen Description   Final    URINE, CLEAN CATCH Performed at [facility], [address] [place], [place] [address]    Special Requests   Final    NONE Performed at [facility], [address] [place], [place] [address]    Culture   Final    NO GROWTH Performed at [facility], [place] [address], [place], [place] [address]    Report Status 09/14/2022 FINAL  Final  Blood Culture (routine x 2)     Status: None (Preliminary result)   Collection Time: 09/13/22 12:29 AM   Specimen: BLOOD  Result Value Ref Range Status   Specimen Description BLOOD BLOOD RIGHT ARM  Final   Special Requests   Final    BOTTLES DRAWN AEROBIC AND ANAEROBIC Blood Culture adequate volume   Culture   Final    NO GROWTH 3 DAYS Performed at [facility], [address] [place], [place] [address]    Report Status PENDING  Incomplete  Blood Culture (routine x 2)     Status: None (Preliminary result)   Collection Time: 09/13/22 12:35 AM   Specimen: BLOOD  Result Value Ref Range Status   Specimen Description  BLOOD BLOOD RIGHT HAND  Final   Special Requests   Final    BOTTLES DRAWN AEROBIC AND ANAEROBIC Blood Culture adequate volume   Culture   Final    NO GROWTH 3 DAYS Performed at [facility], [address], [place], [place] [address]    Report Status PENDING  Incomplete  MRSA Next Gen by PCR, Nasal  Status: None   Collection Time: 09/13/22 12:33 PM   Specimen: Nasal Mucosa; Nasal Swab  Result Value Ref Range Status   MRSA by PCR Next Gen NOT DETECTED NOT DETECTED Final    Comment: (NOTE) The GeneXpert MRSA Assay (FDA approved for NASAL specimens only), is one component of a comprehensive MRSA colonization surveillance program. It is not intended to diagnose MRSA infection nor to guide or monitor treatment for MRSA infections. Test performance is not FDA approved in patients less than 57 years old. Performed at [facility], [address] [place], [place] [address]     Labs: CBC: Recent Labs  Lab 09/13/22 0029 09/14/22 0419 09/15/22 0559 09/16/22 0436  WBC 14.8* 11.4* 8.6 10.7*  NEUTROABS 9.7* 6.6  --   --   HGB 9.5* 9.1* 9.4* 9.5*  HCT 32.5* 31.2* 31.8* 32.5*  MCV 73.9* 73.8* 73.8* 73.4*  PLT 404* 352 339 056   Basic Metabolic Panel: Recent Labs  Lab 09/13/22 0029 09/13/22 0823 09/14/22 0419 09/15/22 0559 09/16/22 0436  NA 131* 135 136 135 135  K 3.1* 4.4 3.0* 4.2 4.0  CL 95* 103 104 103 102  CO2 _0 GLUCOSE 314* 192* 141* 161* 208*  BUN _1 CREATININE 1.07* 0.84 0.74 0.69 0.62  CALCIUM 8.5* 8.2* 8.2* 8.3* 8.6*  MG  --   --  1.1*  --   --    Liver Function Tests: Recent Labs  Lab 09/13/22 0029 09/13/22 0823 09/14/22 0419 09/15/22 0559 09/16/22 0436  AST _2 ALT _3 ALKPHOS 56 57 50 49 52  BILITOT 1.0 1.0 0.6 0.4 0.5  PROT 6.4* 6.5 5.4* 5.5* 5.7*  ALBUMIN 3.4* 3.3* 2.7* 2.7* 2.9*   CBG: Recent Labs  Lab 09/15/22 0755 09/15/22 1146 09/15/22 1655 09/15/22 2102 09/16/22 0725  GLUCAP 128* 247*  210* 175* 157*    Discharge time spent: greater than 30 minutes.  Signed: Deatra James, MD [facility] 09/16/2022

## 2022-09-16 NOTE — Progress Notes (Signed)
Nsg Discharge Note  Admit Date:  09/13/2022 Discharge date: 09/16/2022   [name] to be D/C'd Home per MD order.  AVS completed.  Copy for chart, and copy for patient signed, and dated. Patient/caregiver able to verbalize understanding.  Discharge Medication: Allergies as of 09/16/2022       Reactions   Codeine Hives   Sulfonamide Derivatives Hives   Canagliflozin    Penicillins Hives   Pt states she does NOT have allergy to penicillin   Sulfa Antibiotics Other (See Comments)   unknown        Medication List     STOP taking these medications    ciprofloxacin 500 MG tablet Commonly known as: CIPRO   DULoxetine 60 MG capsule Commonly known as: CYMBALTA   ketoconazole 2 % cream Commonly known as: NIZORAL   linaclotide 290 MCG Caps capsule Commonly known as: LINZESS   metFORMIN 1000 MG tablet Commonly known as: GLUCOPHAGE   midodrine 2.5 MG tablet Commonly known as: PROAMATINE   pantoprazole 40 MG tablet Commonly known as: PROTONIX   ramipril 5 MG capsule Commonly known as: ALTACE       TAKE these medications    amLODipine 5 MG tablet Commonly known as: NORVASC Take 5 mg by mouth daily.   Aspirin Low Dose 81 MG tablet Generic drug: aspirin EC Take 81 mg by mouth every morning.   atorvastatin 20 MG tablet Commonly known as: LIPITOR Take 20 mg by mouth daily.   doxepin 10 MG capsule Commonly known as: SINEQUAN Take 10 mg by mouth at bedtime.   EPINEPHrine 0.3 mg/0.3 mL Soaj injection Commonly known as: EPI-PEN Inject 0.3 mg into the muscle daily as needed for anaphylaxis.   FLUoxetine 40 MG capsule Commonly known as: PROZAC Take 40 mg by mouth daily.   furosemide 20 MG tablet Commonly known as: LASIX Take 1 tablet (20 mg total) by mouth daily.   Jardiance 25 MG Tabs tablet Generic drug: empagliflozin Take 1 tablet (25 mg total) by mouth daily.   levofloxacin 500 MG tablet Commonly known as: Levaquin Take 1 tablet (500 mg total)  by mouth daily for 3 days.   levothyroxine 150 MCG tablet Commonly known as: SYNTHROID Take 1 tablet (150 mcg total) by mouth daily at 6 (six) AM.   Melatonin 10 MG Tabs Take 10 mg by mouth at bedtime as needed (sleep).   metoprolol succinate 50 MG 24 hr tablet Commonly known as: TOPROL-XL Take 50 mg by mouth daily.   potassium chloride SA 20 MEQ tablet Commonly known as: KLOR-CON M Take 1 tablet (20 mEq total) by mouth daily. What changed:  how much to take when to take this   Soliqua 100-33 UNT-MCG/ML Sopn Generic drug: Insulin Glargine-Lixisenatide Inject 20 Units into the skin daily. Sliding Scale        Discharge Assessment: Vitals:   09/16/22 0534 09/16/22 0825  BP: 129/70 114/68  Pulse: 64 63  Resp: 20   Temp: 98 F (36.7 C)   SpO2: 95%    Skin clean, dry and intact without evidence of skin break down, no evidence of skin tears noted. IV catheter discontinued intact. Site without signs and symptoms of complications - no redness or edema noted at insertion site, patient denies c/o pain - only slight tenderness at site.  Dressing with slight pressure applied.  D/c Instructions-Education: Discharge instructions given to patient/family with verbalized understanding. D/c education completed with patient/family including follow up instructions, medication list, d/c activities limitations  if indicated, with other d/c instructions as indicated by MD - patient able to verbalize understanding, all questions fully answered. Patient instructed to return to ED, call 911, or call MD for any changes in condition.  Patient escorted via [place], and D/C home via private auto.  Marylynn Pearson, LPN [id number] 69:99 AM

## 2022-09-17 ENCOUNTER — Telehealth: Payer: Self-pay

## 2022-09-17 ENCOUNTER — Other Ambulatory Visit: Payer: Self-pay | Admitting: Adult Health

## 2022-09-17 LAB — ALDOSTERONE: Aldosterone: 1.4 ng/dL (ref 0.0–30.0)

## 2022-09-17 NOTE — Telephone Encounter (Signed)
Transition Care Management Unsuccessful Follow-up Telephone Call  Date of discharge and from where:  [facility] 09/16/2022  Attempts:  1st Attempt  Reason for unsuccessful TCM follow-up call:  Voice mail full Juanda Crumble, [place] Direct Dial [phone]

## 2022-09-18 LAB — CULTURE, BLOOD (ROUTINE X 2)
Culture: NO GROWTH
Culture: NO GROWTH
Special Requests: ADEQUATE
Special Requests: ADEQUATE

## 2022-09-20 NOTE — Telephone Encounter (Signed)
Transition Care Management Follow-up Telephone Call Date of discharge and from where: [facility] 09/16/2022 How have you been since you were released from the hospital? weak Any questions or concerns? No  Items Reviewed: Did the pt receive and understand the discharge instructions provided? Yes  Medications obtained and verified? Yes  Other? No  Any new allergies since your discharge? No  Dietary orders reviewed? Yes Do you have support at home? Yes   Home Care and Equipment/Supplies: Were home health services ordered? no If so, what is the name of the agency? N/a  Has the agency set up a time to come to the patient's home? no Were any new equipment or medical supplies ordered?  No What is the name of the medical supply agency? N/a Were you able to get the supplies/equipment? not applicable Do you have any questions related to the use of the equipment or supplies? No  Functional Questionnaire: (I = Independent and D = Dependent) ADLs: I  Bathing/Dressing- I  Meal Prep- I  Eating- I  Maintaining continence- I  Transferring/Ambulation- I  Managing Meds- I  Follow up appointments reviewed:  PCP Hospital f/u appt confirmed? Yes  Scheduled to see Dr Lyndel Safe on 09/26/2022 @ 2:40. [place] [facility] f/u appt confirmed? No   Are transportation arrangements needed? No  If their condition worsens, is the pt aware to call PCP or go to the Emergency Dept.? Yes Was the patient provided with contact information for the PCP's office or ED? Yes Was to pt encouraged to call back with questions or concerns? Yes Juanda Crumble, LPN [place] Direct Dial [phone]

## 2022-09-26 ENCOUNTER — Ambulatory Visit: Payer: Medicare HMO | Admitting: Internal Medicine

## 2022-10-30 ENCOUNTER — Ambulatory Visit (INDEPENDENT_AMBULATORY_CARE_PROVIDER_SITE_OTHER): Payer: Medicare HMO

## 2022-10-30 ENCOUNTER — Encounter: Payer: Self-pay | Admitting: Orthopaedic Surgery

## 2022-10-30 ENCOUNTER — Ambulatory Visit (INDEPENDENT_AMBULATORY_CARE_PROVIDER_SITE_OTHER): Payer: Medicare HMO | Admitting: Orthopaedic Surgery

## 2022-10-30 VITALS — BP 101/55 | HR 100

## 2022-10-30 DIAGNOSIS — M25552 Pain in left hip: Secondary | ICD-10-CM | POA: Diagnosis not present

## 2022-10-30 DIAGNOSIS — S72102A Unspecified trochanteric fracture of left femur, initial encounter for closed fracture: Secondary | ICD-10-CM | POA: Insufficient documentation

## 2022-10-30 DIAGNOSIS — S72102D Unspecified trochanteric fracture of left femur, subsequent encounter for closed fracture with routine healing: Secondary | ICD-10-CM

## 2022-10-30 DIAGNOSIS — M79601 Pain in right arm: Secondary | ICD-10-CM

## 2022-10-30 NOTE — Progress Notes (Signed)
Office Visit Note   Patient: Jeanne Kim           Date of Birth: [birth year]           MRN: [id number] Visit Date: 10/30/2022              Requested by: No referring provider defined for this encounter. PCP: Pcp, No   Assessment & Plan: Visit Diagnoses:  1. Right arm pain   2. Pain in left hip     Plan: Will see her in 1 week for reduction and application of a Sarmiento brace [id number].  She remains nonweightbearing for trochanteric fracture and unfortunately with the proximal humerus fracture on the opposite side this would limit her mobility.  She can work on flexion extension of her fingers on her own.  Unfortunately she is going to be unable to ambulate for least 6 weeks.  Reviewed humerus x-rays when I partner send he agrees nonoperative treatment with bracing his best choice for her right proximal humeral shaft fracture.  Follow-Up Instructions: No follow-ups on file.   Orders:  Orders Placed This Encounter  Procedures   XR Humerus Right   XR HIP UNILAT W OR W/O PELVIS 2-3 VIEWS LEFT   No orders of the defined types were placed in this encounter.     Procedures: No procedures performed   Clinical Data: No additional findings.   Subjective: Chief Complaint  Patient presents with   Right Upper Arm - Fracture    DOI 10/26/2022   Left Hip - Fracture    DOI 10/18/2022    HPI 82 year old female had total of arthroplasty done in [place] and had a fall with a left greater trochanteric fracture.  She has been in skilled nursing facility in [facility] and was taking a wheelchair going to the bathroom had another fall and suffered a proximal right humeral shaft fracture and is in a coaptation splint.  She has some displacement mild angulation of the humeral shaft fracture.  She is on portable oxygen and has extensive history with coronary disease, carotid stenosis, previous left hip fracture resulting in total hip arthroplasty.  History of hyperbilirubinemia,  type 2 diabetes, breast cancer, syncope, depression, carotid artery occlusion without infarct.  Patient is here with one of her daughters.  [place]  Review of Systems all systems noncontributory HPI.   Objective: Vital Signs: BP (!) 101/55   Pulse 100   Physical Exam Constitutional:      Appearance: She is well-developed.  HENT:     Head: Normocephalic.     Comments: Some resolving ecchymosis right forehead.    Right Ear: External ear normal.     Left Ear: External ear normal. There is no impacted cerumen.  Eyes:     Pupils: Pupils are equal, round, and reactive to light.  Neck:     Thyroid: No thyromegaly.     Trachea: No tracheal deviation.  Cardiovascular:     Rate and Rhythm: Normal rate.  Pulmonary:     Effort: Pulmonary effort is normal.  Abdominal:     Palpations: Abdomen is soft.  Musculoskeletal:     Cervical back: No rigidity.  Skin:    General: Skin is warm and dry.  Neurological:     Mental Status: She is alert and oriented to person, place, and time.  Psychiatric:        Behavior: Behavior normal.     Ortho Exam intact thumb extension  finger extension right hand she is in a coaptation splint for right proximal humeral shaft fracture.  Patient is in a wheelchair femur length is equal.  Specialty Comments:  No specialty comments available.  Imaging: XR Humerus Right  Result Date: 10/30/2022 2 view x-rays right humerus obtained and reviewed this shows fracture proximal third shaft with some displacement on lateral.  Overall alignment on AP shows minimal angulation. Impression: Oblique proximal shaft humerus fracture with some displacement more apparent on the lateral than AP image.  XR HIP UNILAT W OR W/O PELVIS 2-3 VIEWS LEFT  Result Date: 10/30/2022 AP frog leg x-ray left hip obtained and reviewed.  This shows left total of arthroplasty.  Nondisplaced greater trochanteric fracture without extension into the shaft.  Send  comparison to 10/26/2022 images show stable nondisplaced fracture. Impression: Nondisplaced left greater trochanteric fracture with previous total hip arthroplasty.    PMFS History: Patient Active Problem List   Diagnosis Date Noted   Chronic constipation 05/23/2022   Aortic atherosclerosis ([place]) 05/23/2022   Hypertension associated with type 2 diabetes mellitus ([place]) 05/23/2022   Hypotension 05/23/2022   Cognitive impairment 05/23/2022   SIRS (systemic inflammatory response syndrome) (HCC) 71/69/6789   Acute metabolic encephalopathy 38/07/1750   Generalized weakness 05/04/2021   Hypoalbuminemia due to protein-calorie malnutrition ([place]) 05/04/2021   Hyperlipidemia 05/04/2021   Prolonged QT interval 05/04/2021   Symptomatic anemia 12/23/2020   GI bleeding 12/20/2020   Hypokalemia due to excessive gastrointestinal loss of potassium 10/31/2020   Hyperbilirubinemia 10/31/2020   Thrombocytosis 10/31/2020   Abdominal pain 10/04/2020   Fibromyalgia 10/04/2020   Proctocolitis 10/04/2020   Gastric ulcer 10/04/2020   History of gastrointestinal bleeding 10/04/2020   Intracranial bleed ([place]) 09/28/2020   AKI (acute kidney injury) ([place]) 09/28/2020   Upper GI bleed 08/05/2020   Hypokalemia 03/25/2020   Hypomagnesemia 03/22/2020   Insomnia 03/22/2020   MDD (major depressive disorder) 03/22/2020   Status post total replacement of left hip 03/21/2020   S/P musculoskeletal system surgery 03/21/2020   At risk for falls 02/24/2020   Chronic, continuous use of opioids 02/24/2020   Senile osteoporosis 02/24/2020   History of radiation therapy 02/24/2020   Leukemoid reaction 02/06/2020   Microcytic anemia 02/05/2020   Electrolyte abnormality 02/05/2020   Bilateral lower extremity edema 02/04/2020   Pathological fracture of left hip due to age-related osteoporosis ([place]) 02/04/2020   History of fracture of left hip 02/03/2020   Malignant neoplasm of breast ([place]) 07/02/2013   Occlusion and  stenosis of carotid artery without mention of cerebral infarction 04/28/2013   Carotid stenosis 01/20/2013   Autonomic orthostatic hypotension 10/17/2012   Postural hypotension 10/17/2012   Dyspnea 08/27/2012   Syncope 02/17/2012   Personal history of colonic polyps 12/12/2011   Diverticulosis of colon 12/12/2011   Gastritis 12/12/2011   GERD with stricture 12/12/2011   Gastroesophageal reflux disease 11/27/2011   IBS (irritable bowel syndrome) 11/27/2011   S/P cholecystectomy 11/27/2011   Chest pain 12/09/2009   History of breast cancer 12/09/2009   Hypothyroidism 04/27/2009   OVERWEIGHT/OBESITY 04/07/2009   Essential hypertension 04/07/2009   CAD (coronary artery disease) 04/07/2009   Irritable bowel syndrome 04/07/2009   DEGENERATIVE [place] DISEASE 04/07/2009   Uncontrolled type 2 diabetes mellitus with hyperglycemia, with long-term current use of insulin ([place]) 05/04/2008   Thyroid nodule 05/04/2008   Past Medical History:  Diagnosis Date   Allergy    Anemia    Anginal pain ([place])    Anxiety    Arthritis  Blood transfusion without reported diagnosis    Breast cancer ([place])    Carotid artery plaque    bilat   Cataract    Colon polyps    adenomatous   Concussion 02/2016   Driving restrictions for 6w   Coronary heart disease    Depression    Diverticulosis    Dizziness    Esophageal reflux    Esophageal stricture    Fibromyalgia    Gallstones    Hiatal hernia    Hypercholesterolemia    Hypertension    Hypothyroidism    IBS (irritable bowel syndrome)    Kidney stones    Osteoporosis    Pneumonia    recently, just finished ABO   PONV (postoperative nausea and vomiting)    Sleep apnea    CPAP   Thyroid disease 2002   Type II or unspecified type diabetes mellitus without mention of complication, not stated as uncontrolled    Unspecified gastritis and gastroduodenitis without mention of hemorrhage     Family History  Problem Relation Age of Onset    Sarcoidosis Mother    Diabetes Mother    Cancer Mother        sarcoma -  Right arm amputation   Hypertension Mother    Lung cancer Father    Diabetes Father    Heart disease Father        Before age 57   Urolithiasis Father    Cancer Father        Lung   Hyperlipidemia Father    Hypertension Father    Esophageal cancer Maternal Uncle    Ovarian cancer Maternal Aunt    Colon cancer Maternal Uncle    Lung cancer Maternal Uncle    Diabetes Maternal Uncle    Diabetes Maternal Aunt    Colon polyps Maternal Uncle    Heart disease Paternal Grandmother     Past Surgical History:  Procedure Laterality Date   ANAL RECTAL MANOMETRY N/A 08/05/2015   Procedure: ANO RECTAL MANOMETRY;  Surgeon: Mauri Pole, MD;  [facility];  Service: Endoscopy;  Laterality: N/A;   APPENDECTOMY     BIOPSY  08/06/2020   Procedure: BIOPSY;  Surgeon: Eloise Harman, DO;  [facility];  Service: Endoscopy;;   BIOPSY  12/21/2020   Procedure: BIOPSY;  Surgeon: Thornton Park, MD;  [facility];  Service: Gastroenterology;;   BIOPSY  12/23/2020   Procedure: BIOPSY;  Surgeon: Thornton Park, MD;  [facility];  Service: Gastroenterology;;   BREAST LUMPECTOMY     left   CARDIAC CATHETERIZATION     CATARACT EXTRACTION W/PHACO Left 03/30/2013   Procedure: CATARACT EXTRACTION PHACO AND INTRAOCULAR LENS PLACEMENT ([place]);  Surgeon: Tonny Branch, MD;  Location: [facility];  Service: Ophthalmology;  Laterality: Left;  CDE:19.28   CATARACT EXTRACTION W/PHACO Right 04/09/2013   Procedure: CATARACT EXTRACTION PHACO AND INTRAOCULAR LENS PLACEMENT ([facility]);  Surgeon: Tonny Branch, MD;  Location: [facility];  Service: Ophthalmology;  Laterality: Right;  CDE: 15.44   CERVICAL FUSION     CESAREAN SECTION     CHOLECYSTECTOMY     COLONOSCOPY WITH PROPOFOL N/A 12/23/2020   Procedure: COLONOSCOPY WITH PROPOFOL;  Surgeon: Thornton Park, MD;  [facility];  Service: Gastroenterology;   Laterality: N/A;   CYSTOCELE REPAIR     DILATION AND CURETTAGE OF UTERUS     ESOPHAGOGASTRODUODENOSCOPY (EGD) WITH PROPOFOL N/A 08/06/2020   Procedure: ESOPHAGOGASTRODUODENOSCOPY (EGD) WITH PROPOFOL;  Surgeon: Eloise Harman, DO;  [facility];  Service: Endoscopy;  Laterality: N/A;   ESOPHAGOGASTRODUODENOSCOPY (EGD) WITH PROPOFOL N/A 12/21/2020   Procedure: ESOPHAGOGASTRODUODENOSCOPY (EGD) WITH PROPOFOL;  Surgeon: Thornton Park, MD;  [facility];  Service: Gastroenterology;  Laterality: N/A;   EYE SURGERY Left 03-30-13   Cataract   EYE SURGERY Right 04-09-13   Cataract   RECTOCELE REPAIR     THYROIDECTOMY     TONSILLECTOMY     TOTAL ABDOMINAL HYSTERECTOMY     Social History   Occupational History   Occupation: Retired Optician, dispensing: RETIRED  Tobacco Use   Smoking status: Never   Smokeless tobacco: Never  Vaping Use   Vaping Use: Never used  Substance and Sexual Activity   Alcohol use: No   Drug use: No   Sexual activity: Not on file

## 2022-11-08 ENCOUNTER — Ambulatory Visit (INDEPENDENT_AMBULATORY_CARE_PROVIDER_SITE_OTHER): Payer: Medicare HMO

## 2022-11-08 ENCOUNTER — Encounter: Payer: Self-pay | Admitting: Orthopaedic Surgery

## 2022-11-08 ENCOUNTER — Ambulatory Visit (INDEPENDENT_AMBULATORY_CARE_PROVIDER_SITE_OTHER): Payer: Medicare HMO | Admitting: Orthopaedic Surgery

## 2022-11-08 VITALS — Ht 65.0 in | Wt 155.0 lb

## 2022-11-08 DIAGNOSIS — S42301A Unspecified fracture of shaft of humerus, right arm, initial encounter for closed fracture: Secondary | ICD-10-CM | POA: Diagnosis not present

## 2022-11-08 DIAGNOSIS — S42309A Unspecified fracture of shaft of humerus, unspecified arm, initial encounter for closed fracture: Secondary | ICD-10-CM | POA: Insufficient documentation

## 2022-11-08 NOTE — Progress Notes (Signed)
Office Visit Note   Patient: Jeanne Kim           Date of Birth: 10-05-1941           MRN: [id number] Visit Date: 11/08/2022              Requested by: No referring provider defined for this encounter. PCP: Pcp, No   Assessment & Plan: Visit Diagnoses:  1. Closed fracture of shaft of right humerus, unspecified fracture morphology, initial encounter   2.  Previous left total hip arthroplasty with fall and nondisplaced trochanteric fracture.    GLOBAL for Humerus shaft Fx Plan: Reduction Sarmiento applied.  X-rays postreduction. Unfortunately patient is nonweightbearing with the humeral shaft fracture right upper extremity that she can weight-bear on and also left nondisplaced trochanteric hip fracture with previous total hip arthroplasty on the left.  At her age and overall weakness she is not able to hop on 1 foot and cannot weight-bear and transfer with just right leg and left arm.  Therapy can work on left upper extremity arm strengthening and quad and hamstring strengthening of the right leg.  Patient will return for repeat x-rays of both hip and right humerus in her fracture brace in 4 weeks.  Patient states the 1 hydrocodone 5 mg tablet is not really taking all her pain away and I discussed with her that this is as expected.  We discussed problems if she takes higher dose for a longer period of time and then is not able to get off the medication.  She should get some improvement in pain of the humeral shaft fracture in the brace.  The brace stays on all the time and is not to be removed. Follow-Up Instructions: No follow-ups on file.   Orders:  No orders of the defined types were placed in this encounter.  No orders of the defined types were placed in this encounter.     Procedures: No procedures performed   Clinical Data: No additional findings.   Subjective: Chief Complaint  Patient presents with   Right Upper Arm - Fracture, Follow-up    DOI 10/26/2022   Left  Hip - Fracture, Follow-up    DOI 10/18/2022    HPI 82 year old female with previous total of arthroplasty done in [place] with fall with trochanteric fracture 10/18/2022.  He is try to get to the bathroom in skilled facility fell and fractured her humerus has a humeral shaft fracture.  She was in a coaptation splint and today return for application of a Sarmiento.  Today we applied the Sarmiento fracture brace reducing the fracture postreduction x-rays showed improved position and alignment.  Review of Systems updated unchanged   Objective: Vital Signs: Ht '5\' 5"'$  (1.651 m)   Wt 155 lb (70.3 kg)   BMI 25.79 kg/m   Physical Exam updated unchanged  Ortho Exam axillary sensation median ulnar station hand is intact.  Specialty Comments:  No specialty comments available.  Imaging: No results found.   PMFS History: Patient Active Problem List   Diagnosis Date Noted   Fracture of humeral shaft, closed 11/08/2022   Trochanteric fracture of left femur ([place]) 10/30/2022   Chronic constipation 05/23/2022   Aortic atherosclerosis ([place]) 05/23/2022   Hypertension associated with type 2 diabetes mellitus ([place]) 05/23/2022   Hypotension 05/23/2022   Cognitive impairment 05/23/2022   SIRS (systemic inflammatory response syndrome) ([place]) 93/81/0175   Acute metabolic encephalopathy 08/01/8526   Generalized weakness 05/04/2021   Hypoalbuminemia due to protein-calorie malnutrition (  [place]) 05/04/2021   Hyperlipidemia 05/04/2021   Prolonged QT interval 05/04/2021   Symptomatic anemia 12/23/2020   GI bleeding 12/20/2020   Hypokalemia due to excessive gastrointestinal loss of potassium 10/31/2020   Hyperbilirubinemia 10/31/2020   Thrombocytosis 10/31/2020   Abdominal pain 10/04/2020   Fibromyalgia 10/04/2020   Proctocolitis 10/04/2020   Gastric ulcer 10/04/2020   History of gastrointestinal bleeding 10/04/2020   Intracranial bleed ([place]) 09/28/2020   AKI (acute kidney injury) ([place])  09/28/2020   Upper GI bleed 08/05/2020   Hypokalemia 03/25/2020   Hypomagnesemia 03/22/2020   Insomnia 03/22/2020   MDD (major depressive disorder) 03/22/2020   Status post total replacement of left hip 03/21/2020   S/P musculoskeletal system surgery 03/21/2020   At risk for falls 02/24/2020   Chronic, continuous use of opioids 02/24/2020   Senile osteoporosis 02/24/2020   History of radiation therapy 02/24/2020   Leukemoid reaction 02/06/2020   Microcytic anemia 02/05/2020   Electrolyte abnormality 02/05/2020   Bilateral lower extremity edema 02/04/2020   Pathological fracture of left hip due to age-related osteoporosis ([place]) 02/04/2020   History of fracture of left hip 02/03/2020   Malignant neoplasm of breast ([place]) 07/02/2013   Occlusion and stenosis of carotid artery without mention of cerebral infarction 04/28/2013   Carotid stenosis 01/20/2013   Autonomic orthostatic hypotension 10/17/2012   Postural hypotension 10/17/2012   Dyspnea 08/27/2012   Syncope 02/17/2012   Personal history of colonic polyps 12/12/2011   Diverticulosis of colon 12/12/2011   Gastritis 12/12/2011   GERD with stricture 12/12/2011   Gastroesophageal reflux disease 11/27/2011   IBS (irritable bowel syndrome) 11/27/2011   S/P cholecystectomy 11/27/2011   Chest pain 12/09/2009   History of breast cancer 12/09/2009   Hypothyroidism 04/27/2009   OVERWEIGHT/OBESITY 04/07/2009   Essential hypertension 04/07/2009   CAD (coronary artery disease) 04/07/2009   Irritable bowel syndrome 04/07/2009   DEGENERATIVE [place] DISEASE 04/07/2009   Uncontrolled type 2 diabetes mellitus with hyperglycemia, with long-term current use of insulin ([place]) 05/04/2008   Thyroid nodule 05/04/2008   Past Medical History:  Diagnosis Date   Allergy    Anemia    Anginal pain ([place])    Anxiety    Arthritis    Blood transfusion without reported diagnosis    Breast cancer ([place])    Carotid artery plaque    bilat   Cataract     Colon polyps    adenomatous   Concussion 02/2016   Driving restrictions for 6w   Coronary heart disease    Depression    Diverticulosis    Dizziness    Esophageal reflux    Esophageal stricture    Fibromyalgia    Gallstones    Hiatal hernia    Hypercholesterolemia    Hypertension    Hypothyroidism    IBS (irritable bowel syndrome)    Kidney stones    Osteoporosis    Pneumonia    recently, just finished ABO   PONV (postoperative nausea and vomiting)    Sleep apnea    CPAP   Thyroid disease 2002   Type II or unspecified type diabetes mellitus without mention of complication, not stated as uncontrolled    Unspecified gastritis and gastroduodenitis without mention of hemorrhage     Family History  Problem Relation Age of Onset   Sarcoidosis Mother    Diabetes Mother    Cancer Mother        sarcoma -  Right arm amputation   Hypertension Mother    Lung cancer  Father    Diabetes Father    Heart disease Father        Before age 54   Urolithiasis Father    Cancer Father        Lung   Hyperlipidemia Father    Hypertension Father    Esophageal cancer Maternal Uncle    Ovarian cancer Maternal Aunt    Colon cancer Maternal Uncle    Lung cancer Maternal Uncle    Diabetes Maternal Uncle    Diabetes Maternal Aunt    Colon polyps Maternal Uncle    Heart disease Paternal Grandmother     Past Surgical History:  Procedure Laterality Date   ANAL RECTAL MANOMETRY N/A 08/05/2015   Procedure: ANO RECTAL MANOMETRY;  Surgeon: Mauri Pole, MD;  [facility];  Service: Endoscopy;  Laterality: N/A;   APPENDECTOMY     BIOPSY  08/06/2020   Procedure: BIOPSY;  Surgeon: Eloise Harman, DO;  [facility];  Service: Endoscopy;;   BIOPSY  12/21/2020   Procedure: BIOPSY;  Surgeon: Thornton Park, MD;  [facility];  Service: Gastroenterology;;   BIOPSY  12/23/2020   Procedure: BIOPSY;  Surgeon: Thornton Park, MD;  [facility];   Service: Gastroenterology;;   BREAST LUMPECTOMY     left   CARDIAC CATHETERIZATION     CATARACT EXTRACTION W/PHACO Left 03/30/2013   Procedure: CATARACT EXTRACTION PHACO AND INTRAOCULAR LENS PLACEMENT ([place]);  Surgeon: Tonny Branch, MD;  [facility];  Service: Ophthalmology;  Laterality: Left;  CDE:19.28   CATARACT EXTRACTION W/PHACO Right 04/09/2013   Procedure: CATARACT EXTRACTION PHACO AND INTRAOCULAR LENS PLACEMENT ([facility]);  Surgeon: Tonny Branch, MD;  Location[facility];  Service: Ophthalmology;  Laterality: Right;  CDE: 15.44   CERVICAL FUSION     CESAREAN SECTION     CHOLECYSTECTOMY     COLONOSCOPY WITH PROPOFOL N/A 12/23/2020   Procedure: COLONOSCOPY WITH PROPOFOL;  Surgeon: Thornton Park, MD;  [facility];  Service: Gastroenterology;  Laterality: N/A;   CYSTOCELE REPAIR     DILATION AND CURETTAGE OF UTERUS     ESOPHAGOGASTRODUODENOSCOPY (EGD) WITH PROPOFOL N/A 08/06/2020   Procedure: ESOPHAGOGASTRODUODENOSCOPY (EGD) WITH PROPOFOL;  Surgeon: Eloise Harman, DO;  [facility];  Service: Endoscopy;  Laterality: N/A;   ESOPHAGOGASTRODUODENOSCOPY (EGD) WITH PROPOFOL N/A 12/21/2020   Procedure: ESOPHAGOGASTRODUODENOSCOPY (EGD) WITH PROPOFOL;  Surgeon: Thornton Park, MD;  [facility];  Service: Gastroenterology;  Laterality: N/A;   EYE SURGERY Left 03-30-13   Cataract   EYE SURGERY Right 04-09-13   Cataract   RECTOCELE REPAIR     THYROIDECTOMY     TONSILLECTOMY     TOTAL ABDOMINAL HYSTERECTOMY     Social History   Occupational History   Occupation: Retired Optician, dispensing: RETIRED  Tobacco Use   Smoking status: Never   Smokeless tobacco: Never  Vaping Use   Vaping Use: Never used  Substance and Sexual Activity   Alcohol use: No   Drug use: No   Sexual activity: Not on file

## 2022-12-06 ENCOUNTER — Ambulatory Visit (INDEPENDENT_AMBULATORY_CARE_PROVIDER_SITE_OTHER): Payer: Medicare HMO

## 2022-12-06 ENCOUNTER — Encounter: Payer: Self-pay | Admitting: Orthopaedic Surgery

## 2022-12-06 ENCOUNTER — Ambulatory Visit (INDEPENDENT_AMBULATORY_CARE_PROVIDER_SITE_OTHER): Payer: Medicare HMO | Admitting: Orthopaedic Surgery

## 2022-12-06 VITALS — Ht 65.0 in

## 2022-12-06 DIAGNOSIS — S42301A Unspecified fracture of shaft of humerus, right arm, initial encounter for closed fracture: Secondary | ICD-10-CM | POA: Diagnosis not present

## 2022-12-06 DIAGNOSIS — S72102D Unspecified trochanteric fracture of left femur, subsequent encounter for closed fracture with routine healing: Secondary | ICD-10-CM

## 2022-12-06 NOTE — Progress Notes (Signed)
Post-Op Visit Note   Patient: Jeanne Kim           Date of Birth: Jun 16, 1941           MRN: [id number] Visit Date: 12/06/2022 PCP: Pcp, No   Assessment & Plan: X-rays show trochanteric fracture is healed humerus fracture is starting to heal.  Discontinue sling.  Sarmiento brace tightened return 4 weeks with repeat x-rays right humeral shaft on return and we should be able to discontinue this and Sarmiento brace at that time and we can shoot those x-rays out of the brace.  Note for skilled facility order that she can begin weightbearing as tolerated on her left lower extremity and she is still nonweightbearing on the right upper extremity.  She is still not able to use a platform walker.  She can use a cane in her left hand.  Chief Complaint:  Chief Complaint  Patient presents with   Right Upper Arm - Follow-up, Fracture    DOI 10/26/2022   Left Hip - Fracture, Follow-up    DOI 10/18/2022   Visit Diagnoses:  1. Closed fracture of shaft of right humerus, unspecified fracture morphology, initial encounter   2. Closed fracture of trochanter of left femur with routine healing, subsequent encounter     Plan: Return 4 weeks x-rays as above.  Follow-Up Instructions: No follow-ups on file.   Orders:  Orders Placed This Encounter  Procedures   XR Humerus Right   XR HIP UNILAT W OR W/O PELVIS 2-3 VIEWS LEFT   No orders of the defined types were placed in this encounter.   Imaging: No results found.  PMFS History: Patient Active Problem List   Diagnosis Date Noted   Fracture of humeral shaft, closed 11/08/2022   Trochanteric fracture of left femur ([place]) 10/30/2022   Chronic constipation 05/23/2022   Aortic atherosclerosis ([place]) 05/23/2022   Hypertension associated with type 2 diabetes mellitus ([place]) 05/23/2022   Hypotension 05/23/2022   Cognitive impairment 05/23/2022   SIRS (systemic inflammatory response syndrome) (HCC) [id number]   Acute metabolic encephalopathy  [id number]   Generalized weakness 05/04/2021   Hypoalbuminemia due to protein-calorie malnutrition ([place]) 05/04/2021   Hyperlipidemia 05/04/2021   Prolonged QT interval 05/04/2021   Symptomatic anemia 12/23/2020   GI bleeding 12/20/2020   Hypokalemia due to excessive gastrointestinal loss of potassium 10/31/2020   Hyperbilirubinemia 10/31/2020   Thrombocytosis 10/31/2020   Abdominal pain 10/04/2020   Fibromyalgia 10/04/2020   Proctocolitis 10/04/2020   Gastric ulcer 10/04/2020   History of gastrointestinal bleeding 10/04/2020   Intracranial bleed ([place]) 09/28/2020   AKI (acute kidney injury) ([place]) 09/28/2020   Upper GI bleed 08/05/2020   Hypokalemia 03/25/2020   Hypomagnesemia 03/22/2020   Insomnia 03/22/2020   MDD (major depressive disorder) 03/22/2020   Status post total replacement of left hip 03/21/2020   S/P musculoskeletal system surgery 03/21/2020   At risk for falls 02/24/2020   Chronic, continuous use of opioids 02/24/2020   Senile osteoporosis 02/24/2020   History of radiation therapy 02/24/2020   Leukemoid reaction 02/06/2020   Microcytic anemia 02/05/2020   Electrolyte abnormality 02/05/2020   Bilateral lower extremity edema 02/04/2020   Pathological fracture of left hip due to age-related osteoporosis ([place]) 02/04/2020   History of fracture of left hip 02/03/2020   Malignant neoplasm of breast ([place]) 07/02/2013   Occlusion and stenosis of carotid artery without mention of cerebral infarction 04/28/2013   Carotid stenosis 01/20/2013   Autonomic orthostatic hypotension 10/17/2012   Postural hypotension  10/17/2012   Dyspnea 08/27/2012   Syncope 02/17/2012   Personal history of colonic polyps 12/12/2011   Diverticulosis of colon 12/12/2011   Gastritis 12/12/2011   GERD with stricture 12/12/2011   Gastroesophageal reflux disease 11/27/2011   IBS (irritable bowel syndrome) 11/27/2011   S/P cholecystectomy 11/27/2011   Chest pain 12/09/2009   History of breast  cancer 12/09/2009   Hypothyroidism 04/27/2009   OVERWEIGHT/OBESITY 04/07/2009   Essential hypertension 04/07/2009   CAD (coronary artery disease) 04/07/2009   Irritable bowel syndrome 04/07/2009   DEGENERATIVE [place] DISEASE 04/07/2009   Uncontrolled type 2 diabetes mellitus with hyperglycemia, with long-term current use of insulin ([place]) 05/04/2008   Thyroid nodule 05/04/2008   Past Medical History:  Diagnosis Date   Allergy    Anemia    Anginal pain ([place])    Anxiety    Arthritis    Blood transfusion without reported diagnosis    Breast cancer ([place])    Carotid artery plaque    bilat   Cataract    Colon polyps    adenomatous   Concussion 02/2016   Driving restrictions for 6w   Coronary heart disease    Depression    Diverticulosis    Dizziness    Esophageal reflux    Esophageal stricture    Fibromyalgia    Gallstones    Hiatal hernia    Hypercholesterolemia    Hypertension    Hypothyroidism    IBS (irritable bowel syndrome)    Kidney stones    Osteoporosis    Pneumonia    recently, just finished ABO   PONV (postoperative nausea and vomiting)    Sleep apnea    CPAP   Thyroid disease 2002   Type II or unspecified type diabetes mellitus without mention of complication, not stated as uncontrolled    Unspecified gastritis and gastroduodenitis without mention of hemorrhage     Family History  Problem Relation Age of Onset   Sarcoidosis Mother    Diabetes Mother    Cancer Mother        sarcoma -  Right arm amputation   Hypertension Mother    Lung cancer Father    Diabetes Father    Heart disease Father        Before age 73   Urolithiasis Father    Cancer Father        Lung   Hyperlipidemia Father    Hypertension Father    Esophageal cancer Maternal Uncle    Ovarian cancer Maternal Aunt    Colon cancer Maternal Uncle    Lung cancer Maternal Uncle    Diabetes Maternal Uncle    Diabetes Maternal Aunt    Colon polyps Maternal Uncle    Heart disease Paternal  Grandmother     Past Surgical History:  Procedure Laterality Date   ANAL RECTAL MANOMETRY N/A 08/05/2015   Procedure: ANO RECTAL MANOMETRY;  Surgeon: Mauri Pole, MD;  Location: [facility];  Service: Endoscopy;  Laterality: N/A;   APPENDECTOMY     BIOPSY  08/06/2020   Procedure: BIOPSY;  Surgeon: Eloise Harman, DO;  Location: [facility];  Service: Endoscopy;;   BIOPSY  12/21/2020   Procedure: BIOPSY;  Surgeon: Thornton Park, MD;  Location: [facility];  Service: Gastroenterology;;   BIOPSY  12/23/2020   Procedure: BIOPSY;  Surgeon: Thornton Park, MD;  Location: [facility];  Service: Gastroenterology;;   BREAST LUMPECTOMY     left   CARDIAC CATHETERIZATION     CATARACT  EXTRACTION W/PHACO Left 03/30/2013   Procedure: CATARACT EXTRACTION PHACO AND INTRAOCULAR LENS PLACEMENT (IOC);  Surgeon: Tonny Branch, MD;  Location: [facility];  Service: Ophthalmology;  Laterality: Left;  CDE:19.28   CATARACT EXTRACTION W/PHACO Right 04/09/2013   Procedure: CATARACT EXTRACTION PHACO AND INTRAOCULAR LENS PLACEMENT (IOC);  Surgeon: Tonny Branch, MD;  Location: [facility];  Service: Ophthalmology;  Laterality: Right;  CDE: 15.44   CERVICAL FUSION     CESAREAN SECTION     CHOLECYSTECTOMY     COLONOSCOPY WITH PROPOFOL N/A 12/23/2020   Procedure: COLONOSCOPY WITH PROPOFOL;  Surgeon: Thornton Park, MD;  Location: [facility];  Service: Gastroenterology;  Laterality: N/A;   CYSTOCELE REPAIR     DILATION AND CURETTAGE OF UTERUS     ESOPHAGOGASTRODUODENOSCOPY (EGD) WITH PROPOFOL N/A 08/06/2020   Procedure: ESOPHAGOGASTRODUODENOSCOPY (EGD) WITH PROPOFOL;  Surgeon: Eloise Harman, DO;  [facility];  Service: Endoscopy;  Laterality: N/A;   ESOPHAGOGASTRODUODENOSCOPY (EGD) WITH PROPOFOL N/A 12/21/2020   Procedure: ESOPHAGOGASTRODUODENOSCOPY (EGD) WITH PROPOFOL;  Surgeon: Thornton Park, MD;  Location: [facility];  Service: Gastroenterology;  Laterality: N/A;   EYE SURGERY Left  03-30-13   Cataract   EYE SURGERY Right 04-09-13   Cataract   RECTOCELE REPAIR     THYROIDECTOMY     TONSILLECTOMY     TOTAL ABDOMINAL HYSTERECTOMY     Social History   Occupational History   Occupation: Retired Optician, dispensing: RETIRED  Tobacco Use   Smoking status: Never   Smokeless tobacco: Never  Vaping Use   Vaping Use: Never used  Substance and Sexual Activity   Alcohol use: No   Drug use: No   Sexual activity: Not on file

## 2022-12-17 ENCOUNTER — Ambulatory Visit: Payer: Medicare HMO | Admitting: Internal Medicine

## 2023-01-03 ENCOUNTER — Ambulatory Visit: Payer: Medicare HMO | Admitting: Orthopaedic Surgery

## 2023-01-21 ENCOUNTER — Ambulatory Visit: Payer: Medicare HMO | Admitting: Internal Medicine

## 2023-02-18 ENCOUNTER — Ambulatory Visit: Payer: Medicare HMO | Admitting: Internal Medicine

## 2023-04-07 ENCOUNTER — Emergency Department (HOSPITAL_COMMUNITY): Payer: Medicare HMO

## 2023-04-07 ENCOUNTER — Emergency Department (HOSPITAL_COMMUNITY)
Admission: EM | Admit: 2023-04-07 | Discharge: 2023-04-07 | Disposition: A | Discharge status: Home / Self Care | Payer: Medicare HMO | Attending: Emergency Medicine | Admitting: Emergency Medicine

## 2023-04-07 ENCOUNTER — Encounter (HOSPITAL_COMMUNITY): Payer: Self-pay

## 2023-04-07 ENCOUNTER — Other Ambulatory Visit: Payer: Self-pay

## 2023-04-07 DIAGNOSIS — Z79899 Other long term (current) drug therapy: Secondary | ICD-10-CM | POA: Diagnosis not present

## 2023-04-07 DIAGNOSIS — Z794 Long term (current) use of insulin: Secondary | ICD-10-CM | POA: Diagnosis not present

## 2023-04-07 DIAGNOSIS — S0990XA Unspecified injury of head, initial encounter: Secondary | ICD-10-CM | POA: Diagnosis present

## 2023-04-07 DIAGNOSIS — I10 Essential (primary) hypertension: Secondary | ICD-10-CM | POA: Insufficient documentation

## 2023-04-07 DIAGNOSIS — M25552 Pain in left hip: Secondary | ICD-10-CM | POA: Insufficient documentation

## 2023-04-07 DIAGNOSIS — W19XXXA Unspecified fall, initial encounter: Secondary | ICD-10-CM | POA: Diagnosis not present

## 2023-04-07 DIAGNOSIS — Z7982 Long term (current) use of aspirin: Secondary | ICD-10-CM | POA: Insufficient documentation

## 2023-04-07 LAB — CBC WITH DIFFERENTIAL/PLATELET
Abs Immature Granulocytes: 0.08 10*3/uL — ABNORMAL HIGH (ref 0.00–0.07)
Basophils Absolute: 0.1 10*3/uL (ref 0.0–0.1)
Basophils Relative: 1 %
Eosinophils Absolute: 0.5 10*3/uL (ref 0.0–0.5)
Eosinophils Relative: 4 %
HCT: 33.6 % — ABNORMAL LOW (ref 36.0–46.0)
Hemoglobin: 9.7 g/dL — ABNORMAL LOW (ref 12.0–15.0)
Immature Granulocytes: 1 %
Lymphocytes Relative: 19 %
Lymphs Abs: 2.6 10*3/uL (ref 0.7–4.0)
MCH: 21.2 pg — ABNORMAL LOW (ref 26.0–34.0)
MCHC: 28.9 g/dL — ABNORMAL LOW (ref 30.0–36.0)
MCV: 73.4 fL — ABNORMAL LOW (ref 80.0–100.0)
Monocytes Absolute: 1.9 10*3/uL — ABNORMAL HIGH (ref 0.1–1.0)
Monocytes Relative: 14 %
Neutro Abs: 8.6 10*3/uL — ABNORMAL HIGH (ref 1.7–7.7)
Neutrophils Relative %: 61 %
Platelets: 363 10*3/uL (ref 150–400)
RBC: 4.58 MIL/uL (ref 3.87–5.11)
RDW: 18.6 % — ABNORMAL HIGH (ref 11.5–15.5)
WBC: 13.8 10*3/uL — ABNORMAL HIGH (ref 4.0–10.5)
nRBC: 0 % (ref 0.0–0.2)

## 2023-04-07 LAB — COMPREHENSIVE METABOLIC PANEL
ALT: 15 U/L (ref 0–44)
AST: 21 U/L (ref 15–41)
Albumin: 3.2 g/dL — ABNORMAL LOW (ref 3.5–5.0)
Alkaline Phosphatase: 61 U/L (ref 38–126)
Anion gap: 10 (ref 5–15)
BUN: 15 mg/dL (ref 8–23)
CO2: 28 mmol/L (ref 22–32)
Calcium: 8.5 mg/dL — ABNORMAL LOW (ref 8.9–10.3)
Chloride: 95 mmol/L — ABNORMAL LOW (ref 98–111)
Creatinine, Ser: 0.67 mg/dL (ref 0.44–1.00)
GFR, Estimated: >60 mL/min (ref >60)
Glucose, Bld: 64 mg/dL — ABNORMAL LOW (ref 70–99)
Potassium: 3.5 mmol/L (ref 3.5–5.1)
Sodium: 133 mmol/L — ABNORMAL LOW (ref 135–145)
Total Bilirubin: 1.1 mg/dL (ref 0.3–1.2)
Total Protein: 6.4 g/dL — ABNORMAL LOW (ref 6.5–8.1)

## 2023-04-07 LAB — TROPONIN I (HIGH SENSITIVITY)
Troponin I (High Sensitivity): 2 ng/L (ref <18)
Troponin I (High Sensitivity): <2 ng/L (ref <18)

## 2023-04-07 MED ORDER — IBUPROFEN 800 MG PO TABS: ORAL_TABLET | ORAL | 0 refills | Status: DC

## 2023-04-07 MED ORDER — SODIUM CHLORIDE 0.9 % IV BOLUS
500.0000 mL | Freq: Once | INTRAVENOUS | Status: AC
  Administered 2023-04-07: 500 mL via INTRAVENOUS

## 2023-04-07 NOTE — ED Provider Notes (Signed)
[place] EMERGENCY DEPARTMENT AT [facility] Provider Note   CSN: [id number] Arrival date & time: 04/07/23  1000     History {Add pertinent medical, surgical, social history, OB history to HPI:1} Chief Complaint  Patient presents with   Jeanne Kim is a 82 y.o. female.  Patient fell on her right hip.  She has a history of fractured hip foot surgery.  She is having minimal pain to that hip.  Patient also hit her head.   Fall       Home Medications Prior to Admission medications   Medication Sig Start Date End Date Taking? Authorizing Provider  ibuprofen (ADVIL) 800 MG tablet Take 1 every 8 hours for pain has not relieved by Tylenol alone. 04/07/23  Yes Bethann Berkshire, MD  amLODipine (NORVASC) 5 MG tablet Take 5 mg by mouth daily.    [provider]  ASPIRIN LOW DOSE 81 MG tablet Take 81 mg by mouth every morning. 04/17/22   [provider]  atorvastatin (LIPITOR) 20 MG tablet Take 20 mg by mouth daily. 08/28/22   [provider]  doxepin (SINEQUAN) 10 MG capsule Take 10 mg by mouth at bedtime. 08/28/22   [provider]  EPINEPHrine 0.3 mg/0.3 mL IJ SOAJ injection Inject 0.3 mg into the muscle daily as needed for anaphylaxis. 07/18/09   [provider]  FLUoxetine (PROZAC) 40 MG capsule Take 40 mg by mouth daily. 08/28/22   [provider]  furosemide (LASIX) 20 MG tablet Take 1 tablet (20 mg total) by mouth daily. 06/01/22   Sharee Holster, NP  Insulin Glargine-Lixisenatide (SOLIQUA) 100-33 UNT-MCG/ML SOPN Inject 20 Units into the skin daily. Sliding Scale 06/01/22   Sharee Holster, NP  JARDIANCE 25 MG TABS tablet Take 1 tablet (25 mg total) by mouth daily. 06/01/22   Sharee Holster, NP  levothyroxine (SYNTHROID) 150 MCG tablet Take 1 tablet (150 mcg total) by mouth daily at 6 (six) AM. 06/01/22   Chilton Si, Chong Sicilian, NP  Melatonin 10 MG TABS Take 10 mg by mouth at bedtime as needed (sleep).    [provider]  metoprolol succinate (TOPROL-XL) 50 MG 24 hr tablet Take 50 mg by mouth daily. 08/28/22   [provider]  potassium chloride SA (KLOR-CON M) 20 MEQ tablet Take 1 tablet (20 mEq total) by mouth daily. 09/16/22   Kendell Bane, MD      Allergies    Codeine, Sulfonamide derivatives, Canagliflozin, Penicillins, and Sulfa antibiotics    Review of Systems   Review of Systems  Physical Exam Updated Vital Signs BP (!) 119/56   Pulse 77   Temp 98.6 F (37 C) (Oral)   Resp 20   Ht 5' 5.5" (1.664 m)   Wt 72.6 kg   SpO2 91%   BMI 26.22 kg/m  Physical Exam  ED Results / Procedures / Treatments   Labs (all labs ordered are listed, but only abnormal results are displayed) Labs Reviewed  CBC WITH DIFFERENTIAL/PLATELET - Abnormal; Notable for the following components:      Result Value   WBC 13.8 (*)    Hemoglobin 9.7 (*)    HCT 33.6 (*)    MCV 73.4 (*)    MCH 21.2 (*)    MCHC 28.9 (*)    RDW 18.6 (*)    Neutro Abs 8.6 (*)    Monocytes Absolute 1.9 (*)    Abs Immature Granulocytes 0.08 (*)  All other components within normal limits  COMPREHENSIVE METABOLIC PANEL - Abnormal; Notable for the following components:   Sodium 133 (*)    Chloride 95 (*)    Glucose, Bld 64 (*)    Calcium 8.5 (*)    Total Protein 6.4 (*)    Albumin 3.2 (*)    All other components within normal limits  TROPONIN I (HIGH SENSITIVITY)  TROPONIN I (HIGH SENSITIVITY)    EKG EKG Interpretation Date/Time:  Sunday April 07 2023 11:36:54 EDT Ventricular Rate:  79 PR Interval:    QRS Duration:  147 QT Interval:  400 QTC Calculation: 426 R Axis:   16  Text Interpretation: Atrial fibrillation Nonspecific intraventricular conduction delay Borderline abnrm T, anterolateral leads Artifact in lead(s) I II III aVR aVL aVF V1 Confirmed by Bethann Berkshire [phone]) on 04/07/2023 1:36:09 PM  Radiology DG Hip Unilat With Pelvis 2-3 Views Left  Result Date: 04/07/2023 CLINICAL DATA:   fall EXAM: DG HIP (WITH OR WITHOUT PELVIS) 2-3V LEFT COMPARISON:  10/30/2022 FINDINGS: No fracture or dislocation. previous greater trochanter fracture less conspicuous consistent with interval healing response. Left hip arthroplasty components intact. Bony pelvis intact. Mild spondylitic changes L4-S1. Pelvic phleboliths. IMPRESSION: 1. No acute findings. 2. Interval healing of previous greater trochanter fracture. Electronically Signed   By: Corlis Leak M.D.   On: 04/07/2023 11:16   CT Head Wo Contrast  Result Date: 04/07/2023 CLINICAL DATA:  Head trauma, intracranial arterial injury suspected. Fall yesterday. EXAM: CT HEAD WITHOUT CONTRAST TECHNIQUE: Contiguous axial images were obtained from the base of the skull through the vertex without intravenous contrast. RADIATION DOSE REDUCTION: This exam was performed according to the departmental dose-optimization program which includes automated exposure control, adjustment of the mA and/or kV according to patient size and/or use of iterative reconstruction technique. COMPARISON:  Head CT 11/19/2022 and MRI 09/28/2020 FINDINGS: Brain: There is no evidence of an acute infarct, intracranial hemorrhage, mass, midline shift, or extra-axial fluid collection. Cerebral white matter hypodensities are unchanged and nonspecific but compatible with mild chronic small vessel ischemic disease. Ventriculomegaly is unchanged from the prior CT and felt to reflect central predominant cerebral atrophy of a moderate degree. Vascular: Calcified atherosclerosis at the skull base. No hyperdense vessel. Skull: No acute fracture or suspicious osseous lesion. Sinuses/Orbits: Visualized paranasal sinuses are clear. Trace chronic left mastoid fluid. Bilateral cataract extraction. Other: None. IMPRESSION: 1. No evidence of acute intracranial abnormality. 2. Mild chronic small vessel ischemic disease. 3. Central predominant cerebral atrophy. Electronically Signed   By: Sebastian Ache M.D.   On:  04/07/2023 11:06    Procedures Procedures  {Document cardiac monitor, telemetry assessment procedure when appropriate:1}  Medications Ordered in ED Medications  sodium chloride 0.9 % bolus 500 mL (0 mLs Intravenous Stopped 04/07/23 1240)    ED Course/ Medical Decision Making/ A&P   {   Click here for ABCD2, HEART and other calculatorsREFRESH Note before signing :1}                          Medical Decision Making Amount and/or Complexity of Data Reviewed Labs: ordered. Radiology: ordered. ECG/medicine tests: ordered.  Risk Prescription drug management.   Fall with contusion to left hip.  Patient given Tylenol and Motrin and will follow-up with her doctor  {Document critical care time when appropriate:1} {Document review of labs and clinical decision tools ie heart score, Chads2Vasc2 etc:1}  {Document your independent review of radiology images, and any outside  records:1} {Document your discussion with family members, caretakers, and with consultants:1} {Document social determinants of health affecting pt's care:1} {Document your decision making why or why not admission, treatments were needed:1} Final Clinical Impression(s) / ED Diagnoses Final diagnoses:  Fall, initial encounter    Rx / DC Orders ED Discharge Orders          Ordered    ibuprofen (ADVIL) 800 MG tablet        04/07/23 1346

## 2023-04-07 NOTE — ED Triage Notes (Signed)
[facility] c/o fall yesterday, hurting left hip area. Pt does not remember falling but was found by husband same day then pain became worse overnight so came to ED this am. Pain is 6/10 presently. Pt takea 81mg  ASA daily and has history of hip issues. Pt has not taken any medications today. Hx of Hypertension as well.CBG per EMS 97

## 2023-04-07 NOTE — Discharge Instructions (Addendum)
Take Tylenol for pain.  Follow-up with your family doctor in a week if not improving.  I am also writing for 800 mg Motrin to take if the Tylenol alone does not work

## 2024-03-08 ENCOUNTER — Other Ambulatory Visit: Payer: Self-pay

## 2024-03-08 ENCOUNTER — Inpatient Hospital Stay (HOSPITAL_COMMUNITY)
Admission: EM | Admit: 2024-03-08 | Discharge: 2024-03-12 | DRG: 378 | Disposition: A | Discharge status: Nursing Home (Includes ICF) | Attending: Internal Medicine | Admitting: Internal Medicine

## 2024-03-08 ENCOUNTER — Emergency Department (HOSPITAL_COMMUNITY)

## 2024-03-08 DIAGNOSIS — I10 Essential (primary) hypertension: Secondary | ICD-10-CM | POA: Diagnosis present

## 2024-03-08 DIAGNOSIS — K2971 Gastritis, unspecified, with bleeding: Principal | ICD-10-CM | POA: Diagnosis present

## 2024-03-08 DIAGNOSIS — Z79899 Other long term (current) drug therapy: Secondary | ICD-10-CM

## 2024-03-08 DIAGNOSIS — Z800 Family history of malignant neoplasm of digestive organs: Secondary | ICD-10-CM

## 2024-03-08 DIAGNOSIS — F32A Depression, unspecified: Secondary | ICD-10-CM | POA: Diagnosis present

## 2024-03-08 DIAGNOSIS — D509 Iron deficiency anemia, unspecified: Secondary | ICD-10-CM | POA: Diagnosis present

## 2024-03-08 DIAGNOSIS — E538 Deficiency of other specified B group vitamins: Secondary | ICD-10-CM | POA: Diagnosis present

## 2024-03-08 DIAGNOSIS — M797 Fibromyalgia: Secondary | ICD-10-CM | POA: Diagnosis present

## 2024-03-08 DIAGNOSIS — Z83719 Family history of colon polyps, unspecified: Secondary | ICD-10-CM

## 2024-03-08 DIAGNOSIS — Z801 Family history of malignant neoplasm of trachea, bronchus and lung: Secondary | ICD-10-CM

## 2024-03-08 DIAGNOSIS — Z794 Long term (current) use of insulin: Secondary | ICD-10-CM

## 2024-03-08 DIAGNOSIS — Z8249 Family history of ischemic heart disease and other diseases of the circulatory system: Secondary | ICD-10-CM | POA: Diagnosis not present

## 2024-03-08 DIAGNOSIS — K317 Polyp of stomach and duodenum: Secondary | ICD-10-CM | POA: Diagnosis not present

## 2024-03-08 DIAGNOSIS — D62 Acute posthemorrhagic anemia: Secondary | ICD-10-CM | POA: Diagnosis present

## 2024-03-08 DIAGNOSIS — E861 Hypovolemia: Secondary | ICD-10-CM

## 2024-03-08 DIAGNOSIS — Z888 Allergy status to other drugs, medicaments and biological substances status: Secondary | ICD-10-CM

## 2024-03-08 DIAGNOSIS — G8929 Other chronic pain: Secondary | ICD-10-CM | POA: Diagnosis present

## 2024-03-08 DIAGNOSIS — Z7984 Long term (current) use of oral hypoglycemic drugs: Secondary | ICD-10-CM | POA: Diagnosis not present

## 2024-03-08 DIAGNOSIS — E890 Postprocedural hypothyroidism: Secondary | ICD-10-CM | POA: Diagnosis present

## 2024-03-08 DIAGNOSIS — Z981 Arthrodesis status: Secondary | ICD-10-CM

## 2024-03-08 DIAGNOSIS — K3189 Other diseases of stomach and duodenum: Secondary | ICD-10-CM | POA: Diagnosis present

## 2024-03-08 DIAGNOSIS — Z79890 Hormone replacement therapy (postmenopausal): Secondary | ICD-10-CM | POA: Diagnosis not present

## 2024-03-08 DIAGNOSIS — Z8041 Family history of malignant neoplasm of ovary: Secondary | ICD-10-CM

## 2024-03-08 DIAGNOSIS — Z87442 Personal history of urinary calculi: Secondary | ICD-10-CM

## 2024-03-08 DIAGNOSIS — Z90710 Acquired absence of both cervix and uterus: Secondary | ICD-10-CM

## 2024-03-08 DIAGNOSIS — Z860100 Personal history of colon polyps, unspecified: Secondary | ICD-10-CM

## 2024-03-08 DIAGNOSIS — Q399 Congenital malformation of esophagus, unspecified: Secondary | ICD-10-CM | POA: Diagnosis not present

## 2024-03-08 DIAGNOSIS — D500 Iron deficiency anemia secondary to blood loss (chronic): Secondary | ICD-10-CM | POA: Diagnosis present

## 2024-03-08 DIAGNOSIS — E119 Type 2 diabetes mellitus without complications: Secondary | ICD-10-CM | POA: Diagnosis present

## 2024-03-08 DIAGNOSIS — K449 Diaphragmatic hernia without obstruction or gangrene: Secondary | ICD-10-CM | POA: Diagnosis not present

## 2024-03-08 DIAGNOSIS — K648 Other hemorrhoids: Secondary | ICD-10-CM | POA: Diagnosis present

## 2024-03-08 DIAGNOSIS — D649 Anemia, unspecified: Principal | ICD-10-CM | POA: Diagnosis present

## 2024-03-08 DIAGNOSIS — M810 Age-related osteoporosis without current pathological fracture: Secondary | ICD-10-CM | POA: Diagnosis present

## 2024-03-08 DIAGNOSIS — I2510 Atherosclerotic heart disease of native coronary artery without angina pectoris: Secondary | ICD-10-CM | POA: Diagnosis present

## 2024-03-08 DIAGNOSIS — Z7982 Long term (current) use of aspirin: Secondary | ICD-10-CM

## 2024-03-08 DIAGNOSIS — Z885 Allergy status to narcotic agent status: Secondary | ICD-10-CM

## 2024-03-08 DIAGNOSIS — Z853 Personal history of malignant neoplasm of breast: Secondary | ICD-10-CM

## 2024-03-08 DIAGNOSIS — Z833 Family history of diabetes mellitus: Secondary | ICD-10-CM | POA: Diagnosis not present

## 2024-03-08 DIAGNOSIS — Z83438 Family history of other disorder of lipoprotein metabolism and other lipidemia: Secondary | ICD-10-CM

## 2024-03-08 DIAGNOSIS — R195 Other fecal abnormalities: Secondary | ICD-10-CM | POA: Diagnosis not present

## 2024-03-08 DIAGNOSIS — E7800 Pure hypercholesterolemia, unspecified: Secondary | ICD-10-CM | POA: Diagnosis present

## 2024-03-08 DIAGNOSIS — K633 Ulcer of intestine: Secondary | ICD-10-CM | POA: Diagnosis present

## 2024-03-08 DIAGNOSIS — G4730 Sleep apnea, unspecified: Secondary | ICD-10-CM | POA: Diagnosis present

## 2024-03-08 DIAGNOSIS — Z882 Allergy status to sulfonamides status: Secondary | ICD-10-CM

## 2024-03-08 DIAGNOSIS — K922 Gastrointestinal hemorrhage, unspecified: Secondary | ICD-10-CM | POA: Diagnosis not present

## 2024-03-08 DIAGNOSIS — F419 Anxiety disorder, unspecified: Secondary | ICD-10-CM | POA: Diagnosis present

## 2024-03-08 DIAGNOSIS — K529 Noninfective gastroenteritis and colitis, unspecified: Secondary | ICD-10-CM | POA: Diagnosis present

## 2024-03-08 DIAGNOSIS — K219 Gastro-esophageal reflux disease without esophagitis: Secondary | ICD-10-CM | POA: Diagnosis present

## 2024-03-08 DIAGNOSIS — Z87440 Personal history of urinary (tract) infections: Secondary | ICD-10-CM

## 2024-03-08 DIAGNOSIS — I959 Hypotension, unspecified: Secondary | ICD-10-CM | POA: Diagnosis present

## 2024-03-08 DIAGNOSIS — Z880 Allergy status to penicillin: Secondary | ICD-10-CM

## 2024-03-08 DIAGNOSIS — K5730 Diverticulosis of large intestine without perforation or abscess without bleeding: Secondary | ICD-10-CM | POA: Diagnosis present

## 2024-03-08 DIAGNOSIS — K2950 Unspecified chronic gastritis without bleeding: Secondary | ICD-10-CM | POA: Diagnosis not present

## 2024-03-08 DIAGNOSIS — E860 Dehydration: Secondary | ICD-10-CM | POA: Diagnosis present

## 2024-03-08 DIAGNOSIS — K2970 Gastritis, unspecified, without bleeding: Secondary | ICD-10-CM | POA: Diagnosis not present

## 2024-03-08 LAB — COMPREHENSIVE METABOLIC PANEL WITH GFR
ALT: 14 U/L (ref 0–44)
AST: 23 U/L (ref 15–41)
Albumin: 2.7 g/dL — ABNORMAL LOW (ref 3.5–5.0)
Alkaline Phosphatase: 41 U/L (ref 38–126)
Anion gap: 11 (ref 5–15)
BUN: 13 mg/dL (ref 8–23)
CO2: 23 mmol/L (ref 22–32)
Calcium: 7.6 mg/dL — ABNORMAL LOW (ref 8.9–10.3)
Chloride: 101 mmol/L (ref 98–111)
Creatinine, Ser: 0.82 mg/dL (ref 0.44–1.00)
GFR, Estimated: >60 mL/min (ref >60)
Glucose, Bld: 59 mg/dL — ABNORMAL LOW (ref 70–99)
Potassium: 3.6 mmol/L (ref 3.5–5.1)
Sodium: 135 mmol/L (ref 135–145)
Total Bilirubin: 0.9 mg/dL (ref 0.0–1.2)
Total Protein: 5.5 g/dL — ABNORMAL LOW (ref 6.5–8.1)

## 2024-03-08 LAB — URINALYSIS, ROUTINE W REFLEX MICROSCOPIC
Bacteria, UA: NONE SEEN
Bilirubin Urine: NEGATIVE
Glucose, UA: >=500 mg/dL — AB
Hgb urine dipstick: NEGATIVE
Ketones, ur: NEGATIVE mg/dL
Nitrite: NEGATIVE
Protein, ur: NEGATIVE mg/dL
Specific Gravity, Urine: 1.012 (ref 1.005–1.030)
pH: 6 (ref 5.0–8.0)

## 2024-03-08 LAB — CBC WITH DIFFERENTIAL/PLATELET
Abs Immature Granulocytes: 0.06 10*3/uL (ref 0.00–0.07)
Basophils Absolute: 0.1 10*3/uL (ref 0.0–0.1)
Basophils Relative: 1 %
Eosinophils Absolute: 0.3 10*3/uL (ref 0.0–0.5)
Eosinophils Relative: 3 %
HCT: 26.2 % — ABNORMAL LOW (ref 36.0–46.0)
Hemoglobin: 7.2 g/dL — ABNORMAL LOW (ref 12.0–15.0)
Immature Granulocytes: 1 %
Lymphocytes Relative: 28 %
Lymphs Abs: 2.6 10*3/uL (ref 0.7–4.0)
MCH: 20 pg — ABNORMAL LOW (ref 26.0–34.0)
MCHC: 27.5 g/dL — ABNORMAL LOW (ref 30.0–36.0)
MCV: 72.8 fL — ABNORMAL LOW (ref 80.0–100.0)
Monocytes Absolute: 1.3 10*3/uL — ABNORMAL HIGH (ref 0.1–1.0)
Monocytes Relative: 14 %
Neutro Abs: 5 10*3/uL (ref 1.7–7.7)
Neutrophils Relative %: 53 %
Platelets: 353 10*3/uL (ref 150–400)
RBC: 3.6 MIL/uL — ABNORMAL LOW (ref 3.87–5.11)
RDW: 18.8 % — ABNORMAL HIGH (ref 11.5–15.5)
WBC: 9.4 10*3/uL (ref 4.0–10.5)
nRBC: 0 % (ref 0.0–0.2)

## 2024-03-08 LAB — TROPONIN I (HIGH SENSITIVITY)
Troponin I (High Sensitivity): 3 ng/L (ref <18)
Troponin I (High Sensitivity): 3 ng/L (ref <18)

## 2024-03-08 LAB — LIPASE, BLOOD: Lipase: 24 U/L (ref 11–51)

## 2024-03-08 LAB — POC OCCULT BLOOD, ED: Fecal Occult Bld: POSITIVE — AB

## 2024-03-08 MED ORDER — ACETAMINOPHEN 650 MG RE SUPP
650.0000 mg | Freq: Four times a day (QID) | RECTAL | Status: DC | PRN
Start: 2024-03-08 — End: 2024-03-12

## 2024-03-08 MED ORDER — SODIUM CHLORIDE 0.9% IV SOLUTION: Freq: Once | INTRAVENOUS | Status: AC

## 2024-03-08 MED ORDER — SODIUM CHLORIDE 0.9 % IV BOLUS
1000.0000 mL | Freq: Once | INTRAVENOUS | Status: AC
  Administered 2024-03-09: 1000 mL via INTRAVENOUS

## 2024-03-08 MED ORDER — SENNOSIDES-DOCUSATE SODIUM 8.6-50 MG PO TABS: 1.0000 | ORAL_TABLET | Freq: Every evening | ORAL | Status: DC | PRN

## 2024-03-08 MED ORDER — ONDANSETRON HCL 4 MG PO TABS: 4.0000 mg | ORAL_TABLET | Freq: Four times a day (QID) | ORAL | Status: DC | PRN

## 2024-03-08 MED ORDER — ONDANSETRON HCL 4 MG/2ML IJ SOLN: 4.0000 mg | Freq: Four times a day (QID) | INTRAMUSCULAR | Status: DC | PRN

## 2024-03-08 MED ORDER — ACETAMINOPHEN 325 MG PO TABS: 650.0000 mg | ORAL_TABLET | Freq: Four times a day (QID) | ORAL | Status: DC | PRN

## 2024-03-08 NOTE — ED Notes (Signed)
 CCMD contacted for cardiac monitoring

## 2024-03-08 NOTE — ED Provider Notes (Signed)
 [place] EMERGENCY DEPARTMENT AT [facility] Provider Note   CSN: [id number] Arrival date & time: 03/08/24  2015     History Chief Complaint  Patient presents with   Dizziness    DELORA GRAVATT is a 83 y.o. female.  Patient presents to the emergency department today with concerns of dysuria.  Past history significant for IBS, type 2 diabetes, prior episodes of GI bleed, history of breast cancer here with concerns of somewhat persistent dysuria even after starting antibiotic therapy.  She is unsure what antibiotic she is currently taking but states that she has been on these for a few days without improvement.  Denies any obvious hematuria.  Does endorse some lower abdominal discomfort and feelings of some slight bloating.  No vomiting or diarrhea.  No reported fever, chills or bodyaches.  Regarding patient's dizziness, she reports that this symptom is isolated to only when she tries to stand up after sitting or lying down for period of time.   Dizziness      Home Medications Prior to Admission medications   Medication Sig Start Date End Date Taking? Authorizing Provider  amLODipine  (NORVASC ) 5 MG tablet Take 5 mg by mouth daily.    [provider]  ASPIRIN  LOW DOSE 81 MG tablet Take 81 mg by mouth every morning. 04/17/22   [provider]  atorvastatin  (LIPITOR) 20 MG tablet Take 20 mg by mouth daily. 08/28/22   [provider]  doxepin  (SINEQUAN ) 10 MG capsule Take 10 mg by mouth at bedtime. 08/28/22   [provider]  EPINEPHrine  0.3 mg/0.3 mL IJ SOAJ injection Inject 0.3 mg into the muscle daily as needed for anaphylaxis. 07/18/09   [provider]  FLUoxetine  (PROZAC ) 40 MG capsule Take 40 mg by mouth daily. 08/28/22   [provider]  furosemide  (LASIX ) 20 MG tablet Take 1 tablet (20 mg total) by mouth daily. 06/01/22   Marilyne Shu, NP  ibuprofen  (ADVIL ) 800 MG tablet Take 1 every 8 hours for pain has not  relieved by Tylenol  alone. 04/07/23   Zammit, Joseph, MD  Insulin  Glargine-Lixisenatide  (SOLIQUA ) 100-33 UNT-MCG/ML SOPN Inject 20 Units into the skin daily. Sliding Scale 06/01/22   Marilyne Shu, NP  JARDIANCE  25 MG TABS tablet Take 1 tablet (25 mg total) by mouth daily. 06/01/22   Marilyne Shu, NP  levothyroxine  (SYNTHROID ) 150 MCG tablet Take 1 tablet (150 mcg total) by mouth daily at 6 (six) AM. 06/01/22   Marrie Sizer, Marcellus Sers, NP  Melatonin 10 MG TABS Take 10 mg by mouth at bedtime as needed (sleep).    [provider]  metoprolol  succinate (TOPROL -XL) 50 MG 24 hr tablet Take 50 mg by mouth daily. 08/28/22   [provider]  potassium chloride  SA (KLOR-CON  M) 20 MEQ tablet Take 1 tablet (20 mEq total) by mouth daily. 09/16/22   Bobbetta Burnet, MD      Allergies    Codeine, Sulfonamide derivatives, Canagliflozin, Penicillins, and Sulfa antibiotics    Review of Systems   Review of Systems  Neurological:  Positive for dizziness.  All other systems reviewed and are negative.   Physical Exam Updated Vital Signs BP (!) 106/49   Pulse 82   Temp (!) 97.1 F (36.2 C) (Oral)   Resp 15   Ht 5\' 5"  (1.651 m)   Wt 72 kg   SpO2 100%   BMI 26.41 kg/m  Physical Exam Vitals and nursing note reviewed.  Constitutional:  General: She is not in acute distress.    Appearance: She is well-developed.  HENT:     Head: Normocephalic and atraumatic.  Eyes:     Conjunctiva/sclera: Conjunctivae normal.  Cardiovascular:     Rate and Rhythm: Normal rate and regular rhythm.     Heart sounds: No murmur heard. Pulmonary:     Effort: Pulmonary effort is normal. No respiratory distress.     Breath sounds: Normal breath sounds.  Abdominal:     General: Abdomen is flat. Bowel sounds are normal. There is no distension.     Palpations: Abdomen is soft.     Tenderness: There is abdominal tenderness in the suprapubic area.     Comments: Focal suprapubic tenderness.   Musculoskeletal:        General: No swelling.     Cervical back: Neck supple.  Skin:    General: Skin is warm and dry.     Capillary Refill: Capillary refill takes less than 2 seconds.  Neurological:     Mental Status: She is alert.  Psychiatric:        Mood and Affect: Mood normal.     ED Results / Procedures / Treatments   Labs (all labs ordered are listed, but only abnormal results are displayed) Labs Reviewed  CBC WITH DIFFERENTIAL/PLATELET - Abnormal; Notable for the following components:      Result Value   RBC 3.60 (*)    Hemoglobin 7.2 (*)    HCT 26.2 (*)    MCV 72.8 (*)    MCH 20.0 (*)    MCHC 27.5 (*)    RDW 18.8 (*)    Monocytes Absolute 1.3 (*)    All other components within normal limits  COMPREHENSIVE METABOLIC PANEL WITH GFR - Abnormal; Notable for the following components:   Glucose, Bld 59 (*)    Calcium  7.6 (*)    Total Protein 5.5 (*)    Albumin  2.7 (*)    All other components within normal limits  URINALYSIS, ROUTINE W REFLEX MICROSCOPIC - Abnormal; Notable for the following components:   Glucose, UA >=500 (*)    Leukocytes,Ua TRACE (*)    All other components within normal limits  POC OCCULT BLOOD, ED - Abnormal; Notable for the following components:   Fecal Occult Bld POSITIVE (*)    All other components within normal limits  URINE CULTURE  LIPASE, BLOOD  PREPARE RBC (CROSSMATCH)  TYPE AND SCREEN  TROPONIN I (HIGH SENSITIVITY)  TROPONIN I (HIGH SENSITIVITY)    EKG EKG Interpretation Date/Time:  Sunday March 08 2024 20:23:29 EDT Ventricular Rate:  81 PR Interval:  142 QRS Duration:  79 QT Interval:  390 QTC Calculation: 453 R Axis:   -7  Text Interpretation: Sinus arrhythmia Low voltage, precordial leads Confirmed by Hiawatha Lout ([id number]) on 03/08/2024 10:07:19 PM  Radiology DG Chest Portable 1 View Result Date: 03/08/2024 CLINICAL DATA:  Weakness EXAM: PORTABLE CHEST 1 VIEW COMPARISON:  09/13/2022 FINDINGS: Shallow inspiration.  Mild cardiac enlargement. No airspace disease or consolidation in the lungs. Probable linear atelectasis in the lung bases. No pleural effusion or pneumothorax. Mediastinal contours appear intact. Degenerative changes in the thoracic spine and shoulders. Postoperative changes in the cervical spine. IMPRESSION: Shallow inspiration.  Mild cardiac enlargement.  Lungs are clear. Electronically Signed   By: Boyce Byes M.D.   On: 03/08/2024 20:52    Procedures .Critical Care  Performed by: Concetta Dee, PA-C Authorized by: Fredy Gladu A, PA-C   Critical care  provider statement:    Critical care time (minutes):  44   Critical care start time:  03/08/2024 11:00 PM   Critical care end time:  03/08/2024 11:44 PM   Critical care time was exclusive of:  Separately billable procedures and treating other patients   Critical care was necessary to treat or prevent imminent or life-threatening deterioration of the following conditions:  Circulatory failure   Critical care was time spent personally by me on the following activities:  Development of treatment plan with patient or surrogate, discussions with consultants, evaluation of patient's response to treatment, re-evaluation of patient's condition, ordering and review of laboratory studies, ordering and review of radiographic studies, ordering and performing treatments and interventions and review of old charts   I assumed direction of critical care for this patient from another provider in my specialty: no     Care discussed with: admitting provider       Medications Ordered in ED Medications  0.9 %  sodium chloride  infusion (Manually program via Guardrails IV Fluids) (has no administration in time range)    ED Course/ Medical Decision Making/ A&P                                 Medical Decision Making Amount and/or Complexity of Data Reviewed Labs: ordered. Radiology: ordered.   This patient presents to the ED for concern of dysuria,  weakness.  Differential diagnosis includes UTI, pyelonephritis, urosepsis, dehydration, AKI   Lab Tests:  I Ordered, and personally interpreted labs.  The pertinent results include: CBC with hemoglobin at 7.2, CMP unremarkable, urinalysis unremarkable, Hemoccult positive, lipase unremarkable, troponin negative at 3, delta troponin pending   Imaging Studies ordered:  I ordered imaging studies including chest x-ray I independently visualized and interpreted imaging which showed negative for any acute cardiopulmonary process I agree with the radiologist interpretation   Medicines ordered and prescription drug management:  I ordered medication including blood transfusion for symptomatic anemia, GI bleed Reevaluation of the patient after these medicines showed that the patient improved I have reviewed the patients home medicines and have made adjustments as needed   Problem List / ED Course:  Patient presented to the emergency department today with concerns of dizziness, UTI symptoms.  Reports has been ongoing for last several days and has started on antibiotic therapy.  She is unsure which antibiotic she is currently taking for UTI.  She however does report that she does not feel that her UTI is currently clearing after about 2 or 3 days of antibiotic therapy.  She endorses continued dysuria.  She states same terms of the dizziness type feeling that she has had, she reports a feeling of lightheadedness and weakness when she tries to stand.  This has been going on with symptoms isolated to only with standing.  Denies any room spinning sensation.  Denies any visual changes or hearing changes. On exam, patient has suprapubic tenderness in the abdomen but no other area of focal pain or guarding seen.  Normal bowel sounds.  Heart and lungs are unremarkable.  Will proceed with workup for assessment of possible source of patient's urinary discomfort and weakness. Lab work is notably concerning with  worsening hemoglobin down to 7.2.  Patient is on blood thinners and denies any obvious GI bleeding type symptoms such as hematemesis, medic easy, or melanotic stools.  She denies any heavy aspirin  or ibuprofen  use.  Informed patient that Hemoccult  testing would be recommended for assessment of possible cause of decreasing hemoglobin.  She consents to this.  Other labs are largely reassuring without any significant abnormality seen.  There is some notable hypocalcemia at 7.6 but no other electrolyte disturbance or renal impairment.  Troponin unremarkable and lipase negative. Outside chart reviewed shows CBC with hemoglobin at 8.6 on 12/09/2023. Hemoglobin today down to 7.2. Patient reports no blood thinner or ASA use. Denies hematemesis, hematochezia, or melanotic stools.  Hemoccult positive.  Will consult with GI for evaluation and admit to medicine for symptomatic anemia and suspect GI bleed. Spoke with Dr. Yvonne Hering, hospitalist, who will be admitting patient. Still waiting for GI consultation.  Final Clinical Impression(s) / ED Diagnoses Final diagnoses:  Symptomatic anemia  Gastrointestinal hemorrhage, unspecified gastrointestinal hemorrhage type    Rx / DC Orders ED Discharge Orders     None         Concetta Dee, PA-C 03/08/24 2345    Mordecai Applebaum, MD 03/11/24 1505

## 2024-03-08 NOTE — ED Notes (Signed)
 POC occult collected by PA Zelaya

## 2024-03-08 NOTE — ED Triage Notes (Signed)
 Pt BIB [facility] from [facility] facility. Pt reports dizziness the last few days and Abd pain. Pt just finished Abx for UTI and pt reports she thinks she still has it.. Denies CP

## 2024-03-09 ENCOUNTER — Encounter (HOSPITAL_COMMUNITY): Payer: Self-pay | Admitting: Student

## 2024-03-09 ENCOUNTER — Inpatient Hospital Stay (HOSPITAL_COMMUNITY)

## 2024-03-09 DIAGNOSIS — D500 Iron deficiency anemia secondary to blood loss (chronic): Secondary | ICD-10-CM

## 2024-03-09 DIAGNOSIS — D509 Iron deficiency anemia, unspecified: Secondary | ICD-10-CM | POA: Diagnosis not present

## 2024-03-09 DIAGNOSIS — K922 Gastrointestinal hemorrhage, unspecified: Secondary | ICD-10-CM | POA: Diagnosis not present

## 2024-03-09 DIAGNOSIS — R195 Other fecal abnormalities: Secondary | ICD-10-CM | POA: Diagnosis not present

## 2024-03-09 DIAGNOSIS — K449 Diaphragmatic hernia without obstruction or gangrene: Secondary | ICD-10-CM

## 2024-03-09 DIAGNOSIS — E538 Deficiency of other specified B group vitamins: Secondary | ICD-10-CM | POA: Diagnosis not present

## 2024-03-09 LAB — HEMOGLOBIN AND HEMATOCRIT, BLOOD
HCT: 29.1 % — ABNORMAL LOW (ref 36.0–46.0)
HCT: 31.4 % — ABNORMAL LOW (ref 36.0–46.0)
Hemoglobin: 8.6 g/dL — ABNORMAL LOW (ref 12.0–15.0)
Hemoglobin: 9 g/dL — ABNORMAL LOW (ref 12.0–15.0)

## 2024-03-09 LAB — IRON AND TIBC
Iron: 22 ug/dL — ABNORMAL LOW (ref 28–170)
Saturation Ratios: 6 % — ABNORMAL LOW (ref 10.4–31.8)
TIBC: 388 ug/dL (ref 250–450)
UIBC: 366 ug/dL

## 2024-03-09 LAB — VITAMIN B12: Vitamin B-12: 117 pg/mL — ABNORMAL LOW (ref 180–914)

## 2024-03-09 LAB — BASIC METABOLIC PANEL WITH GFR
Anion gap: 10 (ref 5–15)
BUN: 12 mg/dL (ref 8–23)
CO2: 25 mmol/L (ref 22–32)
Calcium: 7.5 mg/dL — ABNORMAL LOW (ref 8.9–10.3)
Chloride: 101 mmol/L (ref 98–111)
Creatinine, Ser: 0.72 mg/dL (ref 0.44–1.00)
GFR, Estimated: >60 mL/min (ref >60)
Glucose, Bld: 58 mg/dL — ABNORMAL LOW (ref 70–99)
Potassium: 3.4 mmol/L — ABNORMAL LOW (ref 3.5–5.1)
Sodium: 136 mmol/L (ref 135–145)

## 2024-03-09 LAB — CBC
HCT: 25.1 % — ABNORMAL LOW (ref 36.0–46.0)
Hemoglobin: 7.1 g/dL — ABNORMAL LOW (ref 12.0–15.0)
MCH: 20.5 pg — ABNORMAL LOW (ref 26.0–34.0)
MCHC: 28.3 g/dL — ABNORMAL LOW (ref 30.0–36.0)
MCV: 72.3 fL — ABNORMAL LOW (ref 80.0–100.0)
Platelets: 305 10*3/uL (ref 150–400)
RBC: 3.47 MIL/uL — ABNORMAL LOW (ref 3.87–5.11)
RDW: 18.8 % — ABNORMAL HIGH (ref 11.5–15.5)
WBC: 9.2 10*3/uL (ref 4.0–10.5)
nRBC: 0 % (ref 0.0–0.2)

## 2024-03-09 LAB — FERRITIN: Ferritin: 4 ng/mL — ABNORMAL LOW (ref 11–307)

## 2024-03-09 LAB — HEMOGLOBIN A1C
Hgb A1c MFr Bld: 6.8 % — ABNORMAL HIGH (ref 4.8–5.6)
Mean Plasma Glucose: 148.46 mg/dL

## 2024-03-09 LAB — PREPARE RBC (CROSSMATCH)

## 2024-03-09 LAB — TSH: TSH: 0.024 u[IU]/mL — ABNORMAL LOW (ref 0.350–4.500)

## 2024-03-09 LAB — CBG MONITORING, ED: Glucose-Capillary: 70 mg/dL (ref 70–99)

## 2024-03-09 LAB — T4, FREE: Free T4: 1.9 ng/dL — ABNORMAL HIGH (ref 0.61–1.12)

## 2024-03-09 MED ORDER — FERROUS SULFATE 325 (65 FE) MG PO TABS
325.0000 mg | ORAL_TABLET | Freq: Every day | ORAL | Status: DC
  Administered 2024-03-10 – 2024-03-12 (×3): 325 mg via ORAL
  Filled 2024-03-09 (×3): qty 1

## 2024-03-09 MED ORDER — LEVOTHYROXINE SODIUM 25 MCG PO TABS
125.0000 ug | ORAL_TABLET | Freq: Every day | ORAL | Status: DC
  Administered 2024-03-09 – 2024-03-12 (×3): 125 ug via ORAL
  Filled 2024-03-09 (×3): qty 1

## 2024-03-09 MED ORDER — SODIUM CHLORIDE 0.9% FLUSH
10.0000 mL | Freq: Two times a day (BID) | INTRAVENOUS | Status: DC
  Administered 2024-03-09 – 2024-03-12 (×7): 10 mL

## 2024-03-09 MED ORDER — SODIUM CHLORIDE 0.9 % IV SOLN
300.0000 mg | Freq: Once | INTRAVENOUS | Status: AC
  Administered 2024-03-09: 300 mg via INTRAVENOUS
  Filled 2024-03-09: qty 15

## 2024-03-09 MED ORDER — IOHEXOL 350 MG/ML SOLN
75.0000 mL | Freq: Once | INTRAVENOUS | Status: AC | PRN
  Administered 2024-03-09: 75 mL via INTRAVENOUS

## 2024-03-09 MED ORDER — PANTOPRAZOLE SODIUM 40 MG IV SOLR
40.0000 mg | Freq: Two times a day (BID) | INTRAVENOUS | Status: DC
  Administered 2024-03-09 – 2024-03-10 (×5): 40 mg via INTRAVENOUS
  Filled 2024-03-09 (×5): qty 10

## 2024-03-09 MED ORDER — CYANOCOBALAMIN 1000 MCG/ML IJ SOLN
1000.0000 ug | Freq: Once | INTRAMUSCULAR | Status: AC
  Administered 2024-03-09: 1000 ug via INTRAMUSCULAR
  Filled 2024-03-09: qty 1

## 2024-03-09 MED ORDER — SODIUM CHLORIDE 0.9% IV SOLUTION
Freq: Once | INTRAVENOUS | Status: AC
Start: 2024-03-09 — End: 2024-03-10
  Administered 2024-03-10: 100 mL/h via INTRAVENOUS

## 2024-03-09 MED ORDER — POTASSIUM CHLORIDE CRYS ER 20 MEQ PO TBCR
20.0000 meq | EXTENDED_RELEASE_TABLET | Freq: Every day | ORAL | Status: DC
  Administered 2024-03-09 – 2024-03-10 (×2): 20 meq via ORAL
  Filled 2024-03-09 (×2): qty 1

## 2024-03-09 MED ORDER — VITAMIN B-12 1000 MCG PO TABS
1000.0000 ug | ORAL_TABLET | Freq: Every day | ORAL | Status: DC
  Administered 2024-03-09: 1000 ug via ORAL
  Filled 2024-03-09 (×2): qty 1

## 2024-03-09 MED ORDER — SODIUM CHLORIDE 0.9 % IV SOLN: INTRAVENOUS | Status: AC

## 2024-03-09 MED ORDER — EMPAGLIFLOZIN 25 MG PO TABS
25.0000 mg | ORAL_TABLET | Freq: Every day | ORAL | Status: DC
  Administered 2024-03-09 – 2024-03-12 (×4): 25 mg via ORAL
  Filled 2024-03-09 (×5): qty 1

## 2024-03-09 MED ORDER — IRON SUCROSE 300 MG IVPB - SIMPLE MED
300.0000 mg | Freq: Once | Status: DC
  Filled 2024-03-09: qty 265

## 2024-03-09 MED ORDER — MELATONIN 5 MG PO TABS
10.0000 mg | ORAL_TABLET | Freq: Every evening | ORAL | Status: DC | PRN
  Administered 2024-03-09 – 2024-03-10 (×2): 10 mg via ORAL
  Filled 2024-03-09 (×2): qty 2

## 2024-03-09 MED ORDER — ATORVASTATIN CALCIUM 10 MG PO TABS
20.0000 mg | ORAL_TABLET | Freq: Every day | ORAL | Status: DC
  Administered 2024-03-09 – 2024-03-12 (×4): 20 mg via ORAL
  Filled 2024-03-09 (×4): qty 2

## 2024-03-09 MED ORDER — FLUOXETINE HCL 20 MG PO CAPS
40.0000 mg | ORAL_CAPSULE | Freq: Every day | ORAL | Status: DC
  Administered 2024-03-09 – 2024-03-12 (×4): 40 mg via ORAL
  Filled 2024-03-09 (×4): qty 2

## 2024-03-09 MED ORDER — FERROUS SULFATE 325 (65 FE) MG PO TBEC
325.0000 mg | DELAYED_RELEASE_TABLET | Freq: Every day | ORAL | Status: DC
  Administered 2024-03-09: 325 mg via ORAL
  Filled 2024-03-09 (×2): qty 1

## 2024-03-09 MED ORDER — LEVOTHYROXINE SODIUM 75 MCG PO TABS: 150.0000 ug | ORAL_TABLET | Freq: Every day | ORAL | Status: DC

## 2024-03-09 MED ORDER — DOXEPIN HCL 10 MG PO CAPS
10.0000 mg | ORAL_CAPSULE | Freq: Every day | ORAL | Status: DC
  Administered 2024-03-09 – 2024-03-11 (×3): 10 mg via ORAL
  Filled 2024-03-09 (×5): qty 1

## 2024-03-09 MED ORDER — SODIUM CHLORIDE 0.9% FLUSH: 10.0000 mL | INTRAVENOUS | Status: DC | PRN

## 2024-03-09 NOTE — Progress Notes (Signed)
 Progress Note   Patient: Jeanne Kim DOB: [birth year] DOA: 03/08/2024  DOS: the patient was seen and examined on 03/09/2024   Brief hospital course:  83 y.o. female with medical history significant for anxiety and depression, fibromyalgia, HTN, HLD, CAD, hypothyroidism, IBS, T2DM, GERD with stricture, gastric ulcer, prolonged QTc, and macrocytic anemia who presented via EMS from [place] assisted [facility] facility for evaluation of dizziness.    Assessment and Plan:  Acute on chronic blood loss anemia - Hemoglobin 7.1 this morning.  Reportedly taken prior to 1 unit administered.  Recheck hemoglobin 8.6.  Will hold off on transfusion at this time.  Iron studies suggesting severe iron deficiency anemia from chronic blood loss, iron 22, percent sat 6, ferritin 4.  Will order 1 dose of IV Venofer 300 mg.  Will initiate p.o. iron supplementation as well.  Continue to trend hemoglobin.  Transfuse if less than 7 with goal near 8 given symptoms.  Possible acute GI bleed - Noted heme positive stool with decreasing hemoglobin.  GI consulted and following closely.  Likely EGD in a.m.  Vitamin B12 deficiency - Likely contributing to patient's anemia.  Will order one-time IV B12 this morning.  Initiated on daily B12 supplementation as well.  Hypovolemic hypotension - Systolic blood pressures in the 70s on presentation with noted dizziness.  Responded well to IV fluid resuscitation and PRBC.  Hypotension appears to be resolved at this time.  Diabetes mellitus - Insulin  sliding scale on board.  A1c 6.8 suggesting great control.  Hypothyroidism - Patient takes Synthroid  at baseline.  TSH mildly low with mildly elevated T4 suggesting possible mild hyperthyroidism.  May need to adjust Synthroid  dose in the outpatient setting.  Anxiety/depression - Continue fluoxetine /doxepin .  Subjective: Patient resting comfortably this morning.  Feels rough but otherwise improved.  Dizziness resolved.   Denies any hematemesis, dark stools.  Denies any fever, chills, shortness of breath, chest pain, nausea, vomiting, abdominal pain.  Physical Exam:  Vitals:   03/09/24 0415 03/09/24 0700 03/09/24 0722 03/09/24 0827  BP: (!) 104/50 (!) 113/54  113/60  Pulse: 77 78  82  Resp: 20 18  16   Temp:   98.2 F (36.8 C) 98.4 F (36.9 C)  TempSrc:   Oral Oral  SpO2: 97% 93%  100%  Weight:      Height:        GENERAL:  Alert, pleasant, no acute distress  HEENT:  EOMI CARDIOVASCULAR:  RRR, no murmurs appreciated RESPIRATORY:  Clear to auscultation, no wheezing, rales, or rhonchi GASTROINTESTINAL:  Soft, nontender, nondistended EXTREMITIES:  No LE edema bilaterally NEURO:  No new focal deficits appreciated SKIN:  No rashes noted PSYCH:  Appropriate mood and affect     Data Reviewed:  No new imaging review  Previous records (including but not limited to H&P, progress notes, nursing notes, TOC management) were reviewed in assessment of this patient.  Labs: CBC: Recent Labs  Lab 03/08/24 2030 03/09/24 0152 03/09/24 1027  WBC 9.4 9.2  --   NEUTROABS 5.0  --   --   HGB 7.2* 7.1* 8.6*  HCT 26.2* 25.1* 29.1*  MCV 72.8* 72.3*  --   PLT 353 305  --    Basic Metabolic Panel: Recent Labs  Lab 03/08/24 2030 03/09/24 0152  NA 135 136  K 3.6 3.4*  CL 101 101  CO2 23 25  GLUCOSE 59* 58*  BUN 13 12  CREATININE 0.82 0.72  CALCIUM  7.6* 7.5*   Liver Function Tests:  Recent Labs  Lab 03/08/24 2030  AST 23  ALT 14  ALKPHOS 41  BILITOT 0.9  PROT 5.5*  ALBUMIN  2.7*   CBG: Recent Labs  Lab 03/09/24 0426  GLUCAP 70    Scheduled Meds:  sodium chloride    Intravenous Once   atorvastatin   20 mg Oral Daily   vitamin B-12  1,000 mcg Oral Daily   doxepin   10 mg Oral QHS   empagliflozin   25 mg Oral Daily   ferrous sulfate   325 mg Oral Q breakfast   FLUoxetine   40 mg Oral Daily   levothyroxine   125 mcg Oral Q0600   pantoprazole  (PROTONIX ) IV  40 mg Intravenous Q12H    potassium chloride  SA  20 mEq Oral Daily   sodium chloride  flush  10-40 mL Intracatheter Q12H   Continuous Infusions:  iron sucrose 300 mg (03/09/24 1123)   PRN Meds:.acetaminophen  **OR** acetaminophen , melatonin, ondansetron  **OR** ondansetron  (ZOFRAN ) IV, senna-docusate, sodium chloride  flush  Family Communication: None at bedside  Disposition: Status is: Inpatient Remains inpatient appropriate because: Acute anemia with GI bleed     Time spent: 39 minutes  Length of inpatient stay: 1 days  Author: Jodeane Mulligan, DO 03/09/2024 12:01 PM  For on call review [email or web address].

## 2024-03-09 NOTE — Consult Note (Addendum)
 Consultation  Referring Provider: TR[facility]/ Macarthur Savory DO Primary Care Physician:  Veda Gerald, MD Primary Gastroenterologist:  Dr. Leonia Raman  Reason for Consultation: Symptomatic anemia, heme positive  HPI: Jeanne Kim is a 83 y.o. female, with history of adult onset diabetes mellitus, breast cancer, hypertension, fibromyalgia, IBS, coronary artery disease, hypothyroidism, prolonged QT.  Patient reports that she was being treated with an antibiotic last week for urinary tract infection for which she was mostly symptomatic with dysuria.  She cannot recall the name of the antibiotic.  She came to the emergency room last evening with reports that she was feeling dizzy and kind of out of it over the past few days.  She had not fallen or syncopized. She had plaint of persistent dysuria.  Lab workup last evening showed WBC of 9.4/hemoglobin 7.2/hematocrit 26.2 down from hemoglobin of 9.15 March 2023, MCV 72.8/platelets 353 BUN 13/creatinine 0.82 Albumin  2.7 LFTs within normal limits UA positive, urine culture pending Ferritin 4/serum iron 22/TIBC 388/sat 6 B12 117 TSH low at 0.024 Troponin within normal limits  She was transfused 1 unit of packed RBCs last night and this morning hemoglobin still 7.1.  Chest x-ray on admission mild cardiac enlargement/shallow inspiration CT angio/GI bleed-no extravasation to suggest active GI bleeding, large hiatal hernia abnormal bowel wall thickening no colonic wall thickening or mass evident.  Patient is on a baby aspirin  daily, uses Aleve 1 or 2 most days. Previous EGD and colonoscopy March 2022 with EGD showing a medium sized hiatal hernia no Cameron erosions noted multiple sessile gastric polyps. Colonoscopy with few diverticuli in the descending colon, internal hemorrhoids and an area of ulcerated mucosa in the distal sigmoid suspicious for ischemic colitis.  Biopsies did show nonspecific colitis consistent with clinical impression of  ischemia.    Past Medical History:  Diagnosis Date   Allergy    Anemia    Anginal pain (HCC)    Anxiety    Arthritis    Blood transfusion without reported diagnosis    Breast cancer (HCC)    Carotid artery plaque    bilat   Cataract    Colon polyps    adenomatous   Concussion 02/2016   Driving restrictions for 6w   Coronary heart disease    Depression    Diverticulosis    Dizziness    Esophageal reflux    Esophageal stricture    Fibromyalgia    Gallstones    Hiatal hernia    Hypercholesterolemia    Hypertension    Hypothyroidism    IBS (irritable bowel syndrome)    Kidney stones    Osteoporosis    Pneumonia    recently, just finished ABO   PONV (postoperative nausea and vomiting)    Sleep apnea    CPAP   Thyroid  disease 2002   Type II or unspecified type diabetes mellitus without mention of complication, not stated as uncontrolled    Unspecified gastritis and gastroduodenitis without mention of hemorrhage     Past Surgical History:  Procedure Laterality Date   ANAL RECTAL MANOMETRY N/A 08/05/2015   Procedure: ANO RECTAL MANOMETRY;  Surgeon: Sergio Dandy, MD;  Location: [facility];  Service: Endoscopy;  Laterality: N/A;   APPENDECTOMY     BIOPSY  08/06/2020   Procedure: BIOPSY;  Surgeon: Vinetta Greening, DO;  [facility];  Service: Endoscopy;;   BIOPSY  12/21/2020   Procedure: BIOPSY;  Surgeon: Lindle Rhea, MD;  Location: [facility];  Service: Gastroenterology;;  BIOPSY  12/23/2020   Procedure: BIOPSY;  Surgeon: Lindle Rhea, MD;  Location: [facility];  Service: Gastroenterology;;   BREAST LUMPECTOMY     left   CARDIAC CATHETERIZATION     CATARACT EXTRACTION W/PHACO Left 03/30/2013   Procedure: CATARACT EXTRACTION PHACO AND INTRAOCULAR LENS PLACEMENT (IOC);  Surgeon: Anner Kill, MD;  Location: [facility];  Service: Ophthalmology;  Laterality: Left;  CDE:19.28   CATARACT EXTRACTION W/PHACO Right 04/09/2013   Procedure: CATARACT  EXTRACTION PHACO AND INTRAOCULAR LENS PLACEMENT (IOC);  Surgeon: Anner Kill, MD;  Location: [facility];  Service: Ophthalmology;  Laterality: Right;  CDE: 15.44   CERVICAL FUSION     CESAREAN SECTION     CHOLECYSTECTOMY     COLONOSCOPY WITH PROPOFOL  N/A 12/23/2020   Procedure: COLONOSCOPY WITH PROPOFOL ;  Surgeon: Lindle Rhea, MD;  Location: [facility];  Service: Gastroenterology;  Laterality: N/A;   CYSTOCELE REPAIR     DILATION AND CURETTAGE OF UTERUS     ESOPHAGOGASTRODUODENOSCOPY (EGD) WITH PROPOFOL  N/A 08/06/2020   Procedure: ESOPHAGOGASTRODUODENOSCOPY (EGD) WITH PROPOFOL ;  Surgeon: Vinetta Greening, DO;  [facility];  Service: Endoscopy;  Laterality: N/A;   ESOPHAGOGASTRODUODENOSCOPY (EGD) WITH PROPOFOL  N/A 12/21/2020   Procedure: ESOPHAGOGASTRODUODENOSCOPY (EGD) WITH PROPOFOL ;  Surgeon: Lindle Rhea, MD;  Location: [facility];  Service: Gastroenterology;  Laterality: N/A;   EYE SURGERY Left 03-30-13   Cataract   EYE SURGERY Right 04-09-13   Cataract   RECTOCELE REPAIR     THYROIDECTOMY     TONSILLECTOMY     TOTAL ABDOMINAL HYSTERECTOMY      Prior to Admission medications   Medication Sig Start Date End Date Taking? Authorizing Provider  amLODipine  (NORVASC ) 2.5 MG tablet Take 2.5 mg by mouth daily. 02/07/24  Yes [provider]  ASPIRIN  LOW DOSE 81 MG tablet Take 81 mg by mouth every morning. 04/17/22  Yes [provider]  atorvastatin  (LIPITOR) 20 MG tablet Take 20 mg by mouth every evening. 08/28/22  Yes [provider]  doxepin  (SINEQUAN ) 10 MG capsule Take 10 mg by mouth at bedtime. 08/28/22  Yes [provider]  DULoxetine  (CYMBALTA ) 60 MG capsule Take 60 mg by mouth daily. 02/07/24  Yes [provider]  EPINEPHrine  0.3 mg/0.3 mL IJ SOAJ injection Inject 0.3 mg into the muscle daily as needed for anaphylaxis. 07/18/09  Yes [provider]  furosemide  (LASIX ) 20 MG tablet Take 1 tablet (20 mg total) by mouth  daily. 06/01/22  Yes Marilyne Shu, NP  Insulin  Glargine-Lixisenatide  (SOLIQUA ) 100-33 UNT-MCG/ML SOPN Inject 20 Units into the skin daily. Sliding Scale 06/01/22  Yes Marilyne Shu, NP  JARDIANCE  25 MG TABS tablet Take 1 tablet (25 mg total) by mouth daily. 06/01/22  Yes Marilyne Shu, NP  levothyroxine  (SYNTHROID ) 125 MCG tablet Take 125 mcg by mouth daily. 02/07/24  Yes [provider]  metFORMIN (GLUCOPHAGE) 1000 MG tablet Take 1,000 mg by mouth 2 (two) times daily. 02/07/24  Yes [provider]  metoprolol  succinate (TOPROL -XL) 25 MG 24 hr tablet Take 25 mg by mouth daily. 02/07/24  Yes [provider]  midodrine  (PROAMATINE ) 2.5 MG tablet Take 2.5 mg by mouth 3 (three) times daily. 02/07/24  Yes [provider]  pantoprazole  (PROTONIX ) 40 MG tablet Take 40 mg by mouth daily. 02/11/24  Yes [provider]  pioglitazone (ACTOS) 45 MG tablet Take 45 mg by mouth daily. 02/07/24  Yes [provider]  potassium chloride  SA (KLOR-CON  M) 20 MEQ tablet  Take 1 tablet (20 mEq total) by mouth daily. 09/16/22  Yes Shahmehdi, Seyed A, MD  ramipril  (ALTACE ) 5 MG capsule Take 5 mg by mouth daily. 02/07/24  Yes [provider]    Current Facility-Administered Medications  Medication Dose Route Frequency Provider Last Rate Last Admin   0.9 %  sodium chloride  infusion (Manually program via Guardrails IV Fluids)   Intravenous Once Calkins, Derek W, DO       0.9 %  sodium chloride  infusion   Intravenous Continuous Vita Grip, MD       acetaminophen  (TYLENOL ) tablet 650 mg  650 mg Oral Q6H PRN Amponsah, Prosper M, MD       Or   acetaminophen  (TYLENOL ) suppository 650 mg  650 mg Rectal Q6H PRN Vita Grip, MD       atorvastatin  (LIPITOR) tablet 20 mg  20 mg Oral Daily Amponsah, Prosper M, MD       cyanocobalamin  (VITAMIN B12) tablet 1,000 mcg  1,000 mcg Oral Daily Calkins, Derek W, DO       doxepin  (SINEQUAN ) capsule 10 mg  10 mg Oral QHS  Amponsah, Prosper M, MD       empagliflozin  (JARDIANCE ) tablet 25 mg  25 mg Oral Daily Vita Grip, MD       ferrous sulfate  EC tablet 325 mg  325 mg Oral Q breakfast Calkins, Derek W, DO       FLUoxetine  (PROZAC ) capsule 40 mg  40 mg Oral Daily Amponsah, Prosper M, MD       iron sucrose (VENOFER) 300 mg in sodium chloride  0.9 % 250 mL IVPB  300 mg Intravenous Once Calkins, Derek W, DO       levothyroxine  (SYNTHROID ) tablet 125 mcg  125 mcg Oral Q0600 Amponsah, Prosper M, MD   125 mcg at 03/09/24 0747   melatonin tablet 10 mg  10 mg Oral QHS PRN Amponsah, Prosper M, MD       ondansetron  (ZOFRAN ) tablet 4 mg  4 mg Oral Q6H PRN Amponsah, Prosper M, MD       Or   ondansetron  (ZOFRAN ) injection 4 mg  4 mg Intravenous Q6H PRN Amponsah, Prosper M, MD       pantoprazole  (PROTONIX ) injection 40 mg  40 mg Intravenous Q12H Amponsah, Prosper M, MD   40 mg at 03/09/24 4540   potassium chloride  SA (KLOR-CON  M) CR tablet 20 mEq  20 mEq Oral Daily Amponsah, Prosper M, MD       senna-docusate (Senokot-S) tablet 1 tablet  1 tablet Oral QHS PRN Vita Grip, MD       sodium chloride  flush (NS) 0.9 % injection 10-40 mL  10-40 mL Intracatheter Q12H Vita Grip, MD   10 mL at 03/09/24 0100   sodium chloride  flush (NS) 0.9 % injection 10-40 mL  10-40 mL Intracatheter PRN Vita Grip, MD        Allergies as of 03/08/2024 - Review Complete 03/08/2024  Allergen Reaction Noted   Codeine Hives 04/27/2009   Sulfonamide derivatives Hives 04/27/2009   Canagliflozin  12/20/2020   Penicillins Hives 12/20/2020   Sulfa antibiotics Other (See Comments) 05/06/2014    Family History  Problem Relation Age of Onset   Sarcoidosis Mother    Diabetes Mother    Cancer Mother        sarcoma -  Right arm amputation   Hypertension Mother    Lung cancer Father    Diabetes Father  Heart disease Father        Before age 8   Urolithiasis Father    Cancer Father        Lung   Hyperlipidemia  Father    Hypertension Father    Esophageal cancer Maternal Uncle    Ovarian cancer Maternal Aunt    Colon cancer Maternal Uncle    Lung cancer Maternal Uncle    Diabetes Maternal Uncle    Diabetes Maternal Aunt    Colon polyps Maternal Uncle    Heart disease Paternal Grandmother     Social History   Socioeconomic History   Marital status: Married    Spouse name: Not on file   Number of children: 4   Years of education: Not on file   Highest education level: Not on file  Occupational History   Occupation: Retired Academic librarian: RETIRED  Tobacco Use   Smoking status: Never   Smokeless tobacco: Never  Vaping Use   Vaping status: Never Used  Substance and Sexual Activity   Alcohol use: No   Drug use: No   Sexual activity: Not on file  Other Topics Concern   Not on file  Social History Narrative   Not on file   Social Drivers of Health   Financial Resource Strain: Low Risk  (10/19/2022)   Received from [facility], [facility]   Overall Financial Resource Strain (CARDIA)    Difficulty of Paying Living Expenses: Not hard at all  Food Insecurity: No Food Insecurity (10/19/2022)   Received from [facility], [facility]   Hunger Vital Sign    Worried About Running Out of Food in the Last Year: Never true    Ran Out of Food in the Last Year: Never true  Transportation Needs: No Transportation Needs (10/19/2022)   Received from [facility], [facility]   PRAPARE - Transportation    Lack of Transportation (Medical): No    Lack of Transportation (Non-Medical): No  Physical Activity: Insufficiently Active (02/04/2020)   Received from [facility] visits prior to 12/08/2022., [facility] visits prior to 12/08/2022.   Exercise Vital Sign    Days of Exercise per Week: 3 days    Minutes of Exercise per Session: 30 min  Stress: No Stress Concern Present (02/04/2020)   [facility] from [facility]  visits prior to 12/08/2022., [facility] visits prior to 12/08/2022.   [employer] of Occupational Health - Occupational Stress Questionnaire    Feeling of Stress : Only a little  Social Connections: Unknown (02/04/2020)   [facility] prior to 12/08/2022., [facility] visits prior to 12/08/2022.   Social Connection and Isolation Panel [NHANES]    Frequency of Communication with Friends and Family: Not on file    Frequency of Social Gatherings with Friends and Family: Not on file    Attends Religious Services: Not on file    Active Member of Clubs or Organizations: Not on file    Attends Banker Meetings: Not on file    Marital Status: Married  Intimate Partner Violence: Unknown (02/04/2020)   [facility] prior to 12/08/2022., [facility] visits prior to 12/08/2022.   Humiliation, Afraid, Rape, and Kick questionnaire    Fear of Current or Ex-Partner: No    Emotionally Abused:  Not on file    Physically Abused: Not on file    Sexually Abused: Not on file    Review of Systems: Pertinent positive and negative review of systems were noted in the above HPI section.  All other review of systems was otherwise negative.  Physical Exam: Vital signs in last 24 hours: Temp:  [97.1 F (36.2 C)-98.4 F (36.9 C)] 98.4 F (36.9 C) (06/02 0827) Pulse Rate:  [75-87] 82 (06/02 0827) Resp:  [9-24] 16 (06/02 0827) BP: (90-113)/(43-78) 113/60 (06/02 0827) SpO2:  [92 %-100 %] 100 % (06/02 0827) Weight:  [72 kg] 72 kg (06/01 2025)   General:   Alert,  Well-developed, well-nourished elderly white female, pleasant and cooperative in NAD fatigued appearing, pale Head:  Normocephalic and atraumatic. Eyes:  Sclera clear, no icterus.   Conjunctiva pale Ears:  Normal auditory acuity. Nose:  No deformity, discharge,  or lesions. Mouth:  No deformity or  lesions.   Neck:  Supple; no masses or thyromegaly. Lungs:  Clear throughout to auscultation.   No wheezes, crackles, or rhonchi.  Heart:  Regular rate and rhythm; no murmurs, clicks, rubs,  or gallops. Abdomen:  Soft,nontender, BS active,nonpalp mass or hsm.   Rectal: Not done, documented heme positive on admission Msk:  Symmetrical without gross deformities. . Pulses:  Normal pulses noted. Extremities:  Without clubbing or edema. Neurologic:  Alert and  oriented x4;  grossly normal neurologically. Skin:  Intact without significant lesions or rashes.. Psych:  Alert and cooperative. Normal mood and affect.  Intake/Output from previous day: 06/01 0701 - 06/02 0700 In: 1500 [I.V.:500; IV Piggyback:1000] Out: -  Intake/Output this shift: Total I/O In: 535.8 [I.V.:41.8; Blood:494] Out: -   Lab Results: Recent Labs    03/08/24 2030 03/09/24 0152  WBC 9.4 9.2  HGB 7.2* 7.1*  HCT 26.2* 25.1*  PLT 353 305   BMET Recent Labs    03/08/24 2030 03/09/24 0152  NA 135 136  K 3.6 3.4*  CL 101 101  CO2 23 25  GLUCOSE 59* 58*  BUN 13 12  CREATININE 0.82 0.72  CALCIUM  7.6* 7.5*   LFT Recent Labs    03/08/24 2030  PROT 5.5*  ALBUMIN  2.7*  AST 23  ALT 14  ALKPHOS 41  BILITOT 0.9   PT/INR No results for input(s): "LABPROT", "INR" in the last 72 hours. Hepatitis Panel No results for input(s): "HEPBSAG", "HCVAB", "HEPAIGM", "HEPBIGM" in the last 72 hours.   IMPRESSION:  #35 83 year old white female brought to the ER from her facility with complaints of dizziness over the past few days.  This is in the setting of a UTI for which she was on an antibiotic and with persistent complaints of dysuria  Probable persistent UTI  #2 progressive microcytic anemia iron deficient, Hemoccult positive without evidence of overt GI bleeding Suspect chronic GI blood loss possibly secondary to large hiatal hernia/Cameron erosions, rule out chronic gastropathy, chronic ulcer disease, doubt  neoplasm  CT angio last p.m. with no evidence of any small or large bowel wall thickening or other abnormality. Patient had colonoscopy 2022, findings as outlined above no polyps small area of ulceration consistent with a segmental ischemia E G D at that same setting showed a medium size hiatal hernia without Cameron erosions visible and multiple gastric polyps  #3 B12 deficiency #4 diabetes mellitus 5.  History of breast cancer 6.  IBS 7.  Fibromyalgia 8.  Coronary artery disease 9.  Prolonged QT  Plan; patient had solid  breakfast today, will change to clear liquids for the remainder of today Patient will benefit from IV Venofer during this hospitalization, defer to hospitalist IV PPI twice daily Start B12 replacement Discussed endoscopic evaluation with the patient, she is agreeable to EGD and colonoscopy if we feel indicated.  Given negative CT and fairly recent colonoscopy 2022 we may be able to start with EGD and possible enteroscopy which can be scheduled with Dr. Yvone Herd for tomorrow 03/10/2024. I discussed indications risks and benefits with the patient and again she is agreeable to proceed. N.p.o. past midnight Continue to trend hemoglobin and transfuse to keep her hemoglobin closer to 8 given symptomatic state.      Lowell Makara EsterwoodPA-C  03/09/2024, 9:27 AM

## 2024-03-09 NOTE — ED Notes (Signed)
 Attempted to start IV on pt; unsuccessful. IV team consult placed.

## 2024-03-09 NOTE — Progress Notes (Signed)

## 2024-03-09 NOTE — ED Notes (Signed)
 OJ provided to the pt

## 2024-03-09 NOTE — Hospital Course (Addendum)
 83 y.o. female with medical history significant for anxiety and depression, fibromyalgia, HTN, HLD, CAD, hypothyroidism, IBS, T2DM, GERD with stricture, gastric ulcer, prolonged QTc, and macrocytic anemia who presented via EMS from [place] assisted living facility for evaluation of dizziness.     Assessment and Plan:   Acute on chronic blood loss anemia - Hemoglobin 7.1 on presentation.  Iron studies suggesting severe iron deficiency anemia from chronic blood loss, iron 22, percent sat 6, ferritin 4.  S/p 1 unit PRBC.  S/p 1 dose of IV Venofer 300 mg.  Will initiate p.o. iron supplementation as well.  Continue to trend hemoglobin.  Transfuse if less than 7 with goal near 8 given symptoms.   Possible acute GI bleed - Noted heme positive stool with decreasing hemoglobin.  GI consulted and following closely.  Received EGD this morning 6/3 with noted gastritis but no obvious source of bleeding.  PPI on board.  Clear liquid diet initiated.   Vitamin B12 deficiency - Likely contributing to patient's anemia.  S/p IV B12 x 1.  Initiated on daily B12 supplementation as well.   Hypovolemic hypotension - Systolic blood pressures in the 70s on presentation with noted dizziness.  Responded well to IV fluid resuscitation and PRBC.  Hypotension appears to be resolved at this time.   Diabetes mellitus - Insulin  sliding scale on board.  A1c 6.8 suggesting great control.   Hypothyroidism - Patient takes Synthroid  at baseline.  TSH mildly low with mildly elevated T4 suggesting possible mild hyperthyroidism.  May need to adjust Synthroid  dose in the outpatient setting.   Anxiety/depression - Continue fluoxetine /doxepin .

## 2024-03-09 NOTE — Plan of Care (Signed)
  Problem: Education: Goal: Ability to describe self-care measures that may prevent or decrease complications (Diabetes Survival Skills Education) will improve Outcome: Progressing   Problem: Skin Integrity: Goal: Risk for impaired skin integrity will decrease Outcome: Progressing   Problem: Education: Goal: Knowledge of General Education information will improve Description: Including pain rating scale, medication(s)/side effects and non-pharmacologic comfort measures Outcome: Progressing   Problem: Activity: Goal: Risk for activity intolerance will decrease Outcome: Progressing   Problem: Safety: Goal: Ability to remain free from injury will improve Outcome: Progressing

## 2024-03-09 NOTE — H&P (Signed)
 History and Physical  Jeanne Kim WUJ:[id number] DOB: Apr 19, 1941 DOA: 03/08/2024  PCP: Veda Gerald, MD   Chief Complaint: Dizziness  HPI: Jeanne Kim is a 82 y.o. female with medical history significant for anxiety and depression, fibromyalgia, HTN, HLD, CAD, hypothyroidism, IBS, T2DM, GERD with stricture, gastric ulcer, prolonged QTc, and macrocytic anemia who presented via EMS from [facility] facility for evaluation of dizziness. She reports she was being treated for UTI and over the last few days, she has felt lightheaded. She denies any syncope, falls, hematochezia, melena, hemoptysis, hematuria, dysuria, chest pain, shortness of breath, abdominal pain, nausea, vomiting, fevers or chills.  ED Course: Initial vitals showed patient afebrile, normal heart rate in the 70s to 80s, hypotensive with SBP in the 90s-110s, SpO2 100% on room air.  Initial labs significant for WBC 9.4, Hgb 7.2 (from 8.6 3 months ago), normal kidney function, normal lipase and normal troponin, positive FOBT, UA shows significant glucosuria but no signs of infection.  EKG shows sinus rhythm. CXR with no acute cardiopulmonary disease. A unit of blood was ordered.  TRH was consulted for admission.  Review of Systems: Please see HPI for pertinent positives and negatives. A complete 10 system review of systems are otherwise negative.  Past Medical History:  Diagnosis Date   Allergy    Anemia    Anginal pain (HCC)    Anxiety    Arthritis    Blood transfusion without reported diagnosis    Breast cancer (HCC)    Carotid artery plaque    bilat   Cataract    Colon polyps    adenomatous   Concussion 02/2016   Driving restrictions for 6w   Coronary heart disease    Depression    Diverticulosis    Dizziness    Esophageal reflux    Esophageal stricture    Fibromyalgia    Gallstones    Hiatal hernia    Hypercholesterolemia    Hypertension    Hypothyroidism    IBS (irritable bowel syndrome)     Kidney stones    Osteoporosis    Pneumonia    recently, just finished ABO   PONV (postoperative nausea and vomiting)    Sleep apnea    CPAP   Thyroid  disease 2002   Type II or unspecified type diabetes mellitus without mention of complication, not stated as uncontrolled    Unspecified gastritis and gastroduodenitis without mention of hemorrhage    Past Surgical History:  Procedure Laterality Date   ANAL RECTAL MANOMETRY N/A 08/05/2015   Procedure: ANO RECTAL MANOMETRY;  Surgeon: Sergio Dandy, MD;  Location: [facility];  Service: Endoscopy;  Laterality: N/A;   APPENDECTOMY     BIOPSY  08/06/2020   Procedure: BIOPSY;  Surgeon: Vinetta Greening, DO;  Location: [facility];  Service: Endoscopy;;   BIOPSY  12/21/2020   Procedure: BIOPSY;  Surgeon: Lindle Rhea, MD;  Location: [facility];  Service: Gastroenterology;;   BIOPSY  12/23/2020   Procedure: BIOPSY;  Surgeon: Lindle Rhea, MD;  Location: [facility];  Service: Gastroenterology;;   BREAST LUMPECTOMY     left   CARDIAC CATHETERIZATION     CATARACT EXTRACTION W/PHACO Left 03/30/2013   Procedure: CATARACT EXTRACTION PHACO AND INTRAOCULAR LENS PLACEMENT (IOC);  Surgeon: Anner Kill, MD;  Location: [facility];  Service: Ophthalmology;  Laterality: Left;  CDE:19.28   CATARACT EXTRACTION W/PHACO Right 04/09/2013   Procedure: CATARACT EXTRACTION PHACO AND INTRAOCULAR LENS PLACEMENT (IOC);  Surgeon: Anner Kill,  MD;  Location: [facility];  Service: Ophthalmology;  Laterality: Right;  CDE: 15.44   CERVICAL FUSION     CESAREAN SECTION     CHOLECYSTECTOMY     COLONOSCOPY WITH PROPOFOL  N/A 12/23/2020   Procedure: COLONOSCOPY WITH PROPOFOL ;  Surgeon: Lindle Rhea, MD;  Location: [facility];  Service: Gastroenterology;  Laterality: N/A;   CYSTOCELE REPAIR     DILATION AND CURETTAGE OF UTERUS     ESOPHAGOGASTRODUODENOSCOPY (EGD) WITH PROPOFOL  N/A 08/06/2020   Procedure: ESOPHAGOGASTRODUODENOSCOPY (EGD) WITH PROPOFOL ;   Surgeon: Vinetta Greening, DO;  Location: [facility];  Service: Endoscopy;  Laterality: N/A;   ESOPHAGOGASTRODUODENOSCOPY (EGD) WITH PROPOFOL  N/A 12/21/2020   Procedure: ESOPHAGOGASTRODUODENOSCOPY (EGD) WITH PROPOFOL ;  Surgeon: Lindle Rhea, MD;  Location: [facility];  Service: Gastroenterology;  Laterality: N/A;   EYE SURGERY Left 03-30-13   Cataract   EYE SURGERY Right 04-09-13   Cataract   RECTOCELE REPAIR     THYROIDECTOMY     TONSILLECTOMY     TOTAL ABDOMINAL HYSTERECTOMY     Social History:  reports that she has never smoked. She has never used smokeless tobacco. She reports that she does not drink alcohol and does not use drugs.  Allergies  Allergen Reactions   Codeine Hives   Sulfonamide Derivatives Hives   Canagliflozin    Penicillins Hives    Pt states she does NOT have allergy to penicillin   Sulfa Antibiotics Other (See Comments)    unknown    Family History  Problem Relation Age of Onset   Sarcoidosis Mother    Diabetes Mother    Cancer Mother        sarcoma -  Right arm amputation   Hypertension Mother    Lung cancer Father    Diabetes Father    Heart disease Father        Before age 65   Urolithiasis Father    Cancer Father        Lung   Hyperlipidemia Father    Hypertension Father    Esophageal cancer Maternal Uncle    Ovarian cancer Maternal Aunt    Colon cancer Maternal Uncle    Lung cancer Maternal Uncle    Diabetes Maternal Uncle    Diabetes Maternal Aunt    Colon polyps Maternal Uncle    Heart disease Paternal Grandmother      Prior to Admission medications   Medication Sig Start Date End Date Taking? Authorizing Provider  amLODipine  (NORVASC ) 5 MG tablet Take 5 mg by mouth daily.    [provider]  ASPIRIN  LOW DOSE 81 MG tablet Take 81 mg by mouth every morning. 04/17/22   [provider]  atorvastatin  (LIPITOR) 20 MG tablet Take 20 mg by mouth daily. 08/28/22   [provider]  doxepin  (SINEQUAN ) 10 MG  capsule Take 10 mg by mouth at bedtime. 08/28/22   [provider]  EPINEPHrine  0.3 mg/0.3 mL IJ SOAJ injection Inject 0.3 mg into the muscle daily as needed for anaphylaxis. 07/18/09   [provider]  FLUoxetine  (PROZAC ) 40 MG capsule Take 40 mg by mouth daily. 08/28/22   [provider]  furosemide  (LASIX ) 20 MG tablet Take 1 tablet (20 mg total) by mouth daily. 06/01/22   Marilyne Shu, NP  ibuprofen  (ADVIL ) 800 MG tablet Take 1 every 8 hours for pain has not relieved by Tylenol  alone. 04/07/23   Zammit, Joseph, MD  Insulin  Glargine-Lixisenatide  (SOLIQUA ) 100-33 UNT-MCG/ML SOPN Inject 20 Units into the  skin daily. Sliding Scale 06/01/22   Marilyne Shu, NP  JARDIANCE  25 MG TABS tablet Take 1 tablet (25 mg total) by mouth daily. 06/01/22   Marilyne Shu, NP  levothyroxine  (SYNTHROID ) 150 MCG tablet Take 1 tablet (150 mcg total) by mouth daily at 6 (six) AM. 06/01/22   Marrie Sizer, Marcellus Sers, NP  Melatonin 10 MG TABS Take 10 mg by mouth at bedtime as needed (sleep).    [provider]  metoprolol  succinate (TOPROL -XL) 50 MG 24 hr tablet Take 50 mg by mouth daily. 08/28/22   [provider]  potassium chloride  SA (KLOR-CON  M) 20 MEQ tablet Take 1 tablet (20 mEq total) by mouth daily. 09/16/22   Bobbetta Burnet, MD    Physical Exam: BP 102/68   Pulse 75   Temp (!) 97.1 F (36.2 C) (Oral)   Resp (!) 9   Ht 5\' 5"  (1.651 m)   Wt 72 kg   SpO2 100%   BMI 26.41 kg/m  General: Pleasant, well-appearing elderly woman laying in bed. No acute distress. HEENT: [place]/AT. Anicteric sclera. Dry mucous membrane CV: RRR. No murmurs, rubs, or gallops. Trace BLE edema Pulmonary: Lungs CTAB. Normal effort. No wheezing or rales. Abdominal: Soft, nontender, nondistended. Normal bowel sounds. Extremities: Palpable radial and DP pulses. Normal ROM. Skin: Warm and dry. No obvious rash or lesions. Decreased skin turgor Neuro: A&Ox3. Moves all extremities. Poor  short-term memory. Normal sensation to light touch. No focal deficit. Psych: Normal mood and affect          Labs on Admission:  Basic Metabolic Panel: Recent Labs  Lab 03/08/24 2030  NA 135  K 3.6  CL 101  CO2 23  GLUCOSE 59*  BUN 13  CREATININE 0.82  CALCIUM  7.6*   Liver Function Tests: Recent Labs  Lab 03/08/24 2030  AST 23  ALT 14  ALKPHOS 41  BILITOT 0.9  PROT 5.5*  ALBUMIN  2.7*   Recent Labs  Lab 03/08/24 2030  LIPASE 24   No results for input(s): "AMMONIA" in the last 168 hours. CBC: Recent Labs  Lab 03/08/24 2030  WBC 9.4  NEUTROABS 5.0  HGB 7.2*  HCT 26.2*  MCV 72.8*  PLT 353   Cardiac Enzymes: No results for input(s): "CKTOTAL", "CKMB", "CKMBINDEX", "TROPONINI" in the last 168 hours. BNP (last 3 results) No results for input(s): "BNP" in the last 8760 hours.  ProBNP (last 3 results) No results for input(s): "PROBNP" in the last 8760 hours.  CBG: No results for input(s): "GLUCAP" in the last 168 hours.  Radiological Exams on Admission: DG Chest Portable 1 View Result Date: 03/08/2024 CLINICAL DATA:  Weakness EXAM: PORTABLE CHEST 1 VIEW COMPARISON:  09/13/2022 FINDINGS: Shallow inspiration. Mild cardiac enlargement. No airspace disease or consolidation in the lungs. Probable linear atelectasis in the lung bases. No pleural effusion or pneumothorax. Mediastinal contours appear intact. Degenerative changes in the thoracic spine and shoulders. Postoperative changes in the cervical spine. IMPRESSION: Shallow inspiration.  Mild cardiac enlargement.  Lungs are clear. Electronically Signed   By: Boyce Byes M.D.   On: 03/08/2024 20:52   Assessment/Plan Jeanne Kim is a 83 y.o. female with medical history significant for anxiety and depression, fibromyalgia, HTN, HLD, CAD, hypothyroidism, IBS, T2DM, GERD with stricture, gastric ulcer, prolonged QTc, and microcytic anemia who presented via EMS from [facility] assisted [facility] facility for evaluation  of dizziness and admitted for symptomatic anemia and likely GI bleed.  # Symptomatic anemia # Hx of  microcytic anemia # GI bleed - Patient with a history of anemia presented with lightheadedness over the last few days - Hgb 7.2 on admission from baseline of 8.5-9.5 - Patient mentating well but hypertensive with SBP in 90-100s - EGD in 2022 showed tortuous esophagus, medium size hiatal hernia, multiple gastric polyps and a few gastric erosions, biopsied.  - Colonoscopy in 2022 showed nonbleeding internal hemorrhoids, sigmoid diverticulosis and distal sigmoid ulcerations suspicious for ischemic colitis. - GI consulted, appreciate recs - Continue 1 unit PRBC transfusion - IV Protonix  40 mg every 12 hours - Follow-up CTA GI bleed study - Follow-up posttransfusion H&H - Check iron studies, ferritin and vitamin B12  # Hypotension - Patient presented with few days of dizziness and found to have low BP with SBP in the 90s-100s - Likely in the setting of GI bleed and dehydration - SBP improved to the 110s after IV NS 1 L bolus - Start IV NS at 100 cc/h x 10 hours - Check orthostatic vitals  # T2DM - A1c of 8.4% 3 months ago - Blood sugar 59 on CMP - Q4H SSI with CBGs for close monitoring - Continue Jardiance  - Follow-up repeat A1c - # HTN - Patient hypotensive on admission - Hold BP meds  # Synthroid  - Follow-up repeat TSH and free T4 - Continue Synthroid   # HLD - Continue atorvastatin   # Anxiety and depression - Continue fluoxetine  and doxepin   # Of note, patient's medications resumed based on recent refills.  Please resume review of all meds when med rec is completed by pharmacy  DVT prophylaxis: TED Hose    Code Status: Full Code  Consults called: GI  Family Communication: No family at bedside  Severity of Illness: The appropriate patient status for this patient is INPATIENT. Inpatient status is judged to be reasonable and necessary in order to provide the required  intensity of service to ensure the patient's safety. The patient's presenting symptoms, physical exam findings, and initial radiographic and laboratory data in the context of their chronic comorbidities is felt to place them at high risk for further clinical deterioration. Furthermore, it is not anticipated that the patient will be medically stable for discharge from the hospital within 2 midnights of admission.   * I certify that at the point of admission it is my clinical judgment that the patient will require inpatient hospital care spanning beyond 2 midnights from the point of admission due to high intensity of service, high risk for further deterioration and high frequency of surveillance required.*  Level of care: Telemetry Medical   This record has been created using Conservation officer, historic buildings. Errors have been sought and corrected, but may not always be located. Such creation errors do not reflect on the standard of care.   Vita Grip, MD 03/09/2024, 1:16 AM [facility] Pager: [phone] Isaiah 41:10   If 7PM-7AM, please contact night-coverage [email or web address] Password [email or web address]

## 2024-03-10 ENCOUNTER — Inpatient Hospital Stay (HOSPITAL_COMMUNITY): Admitting: Anesthesiology

## 2024-03-10 ENCOUNTER — Encounter (HOSPITAL_COMMUNITY)
Admission: EM | Disposition: A | Discharge status: Nursing Home (Includes ICF) | Payer: Self-pay | Source: Home / Self Care | Attending: Internal Medicine

## 2024-03-10 ENCOUNTER — Encounter (HOSPITAL_COMMUNITY): Payer: Self-pay | Admitting: Pediatrics

## 2024-03-10 DIAGNOSIS — K317 Polyp of stomach and duodenum: Secondary | ICD-10-CM | POA: Diagnosis not present

## 2024-03-10 DIAGNOSIS — K3189 Other diseases of stomach and duodenum: Secondary | ICD-10-CM

## 2024-03-10 DIAGNOSIS — K2970 Gastritis, unspecified, without bleeding: Secondary | ICD-10-CM | POA: Diagnosis not present

## 2024-03-10 DIAGNOSIS — K2950 Unspecified chronic gastritis without bleeding: Secondary | ICD-10-CM

## 2024-03-10 DIAGNOSIS — D509 Iron deficiency anemia, unspecified: Secondary | ICD-10-CM | POA: Diagnosis not present

## 2024-03-10 DIAGNOSIS — K449 Diaphragmatic hernia without obstruction or gangrene: Secondary | ICD-10-CM

## 2024-03-10 DIAGNOSIS — Q399 Congenital malformation of esophagus, unspecified: Secondary | ICD-10-CM | POA: Diagnosis not present

## 2024-03-10 DIAGNOSIS — E538 Deficiency of other specified B group vitamins: Secondary | ICD-10-CM | POA: Diagnosis not present

## 2024-03-10 DIAGNOSIS — D500 Iron deficiency anemia secondary to blood loss (chronic): Secondary | ICD-10-CM | POA: Diagnosis not present

## 2024-03-10 DIAGNOSIS — K922 Gastrointestinal hemorrhage, unspecified: Secondary | ICD-10-CM | POA: Diagnosis not present

## 2024-03-10 HISTORY — PX: ENTEROSCOPY: SHX5533

## 2024-03-10 LAB — BASIC METABOLIC PANEL WITH GFR
Anion gap: 10 (ref 5–15)
BUN: 6 mg/dL — ABNORMAL LOW (ref 8–23)
CO2: 25 mmol/L (ref 22–32)
Calcium: 8 mg/dL — ABNORMAL LOW (ref 8.9–10.3)
Chloride: 102 mmol/L (ref 98–111)
Creatinine, Ser: 0.73 mg/dL (ref 0.44–1.00)
GFR, Estimated: >60 mL/min (ref >60)
Glucose, Bld: 115 mg/dL — ABNORMAL HIGH (ref 70–99)
Potassium: 3.5 mmol/L (ref 3.5–5.1)
Sodium: 137 mmol/L (ref 135–145)

## 2024-03-10 LAB — HEMOGLOBIN AND HEMATOCRIT, BLOOD
HCT: 31.3 % — ABNORMAL LOW (ref 36.0–46.0)
HCT: 31.2 % — ABNORMAL LOW (ref 36.0–46.0)
Hemoglobin: 9 g/dL — ABNORMAL LOW (ref 12.0–15.0)
Hemoglobin: 9 g/dL — ABNORMAL LOW (ref 12.0–15.0)

## 2024-03-10 LAB — MAGNESIUM: Magnesium: 1.2 mg/dL — ABNORMAL LOW (ref 1.7–2.4)

## 2024-03-10 LAB — BPAM RBC: Blood Product Expiration Date: 202506132359

## 2024-03-10 LAB — MRSA NEXT GEN BY PCR, NASAL: MRSA by PCR Next Gen: NOT DETECTED

## 2024-03-10 SURGERY — ENTEROSCOPY
Anesthesia: Monitor Anesthesia Care

## 2024-03-10 MED ORDER — ONDANSETRON HCL 4 MG/2ML IJ SOLN
INTRAMUSCULAR | Status: DC | PRN
  Administered 2024-03-10: 4 mg via INTRAVENOUS

## 2024-03-10 MED ORDER — LIDOCAINE 2% (20 MG/ML) 5 ML SYRINGE
INTRAMUSCULAR | Status: DC | PRN
  Administered 2024-03-10: 50 mg via INTRAVENOUS

## 2024-03-10 MED ORDER — PHENYLEPHRINE HCL-NACL 20-0.9 MG/250ML-% IV SOLN
INTRAVENOUS | Status: DC | PRN
  Administered 2024-03-10: 80 ug via INTRAVENOUS
  Administered 2024-03-10: 120 ug via INTRAVENOUS
  Administered 2024-03-10: 80 ug via INTRAVENOUS
  Administered 2024-03-10: 40 ug via INTRAVENOUS

## 2024-03-10 MED ORDER — PROPOFOL 10 MG/ML IV BOLUS
INTRAVENOUS | Status: DC | PRN
  Administered 2024-03-10: 50 mg via INTRAVENOUS
  Administered 2024-03-10: 100 ug/kg/min via INTRAVENOUS

## 2024-03-10 NOTE — Progress Notes (Signed)
 Progress Note   Patient: Jeanne Kim QIO:[id number] DOB: August 19, 1941 DOA: 03/08/2024  DOS: the patient was seen and examined on 03/10/2024   Brief hospital course:  83 y.o. female with medical history significant for anxiety and depression, fibromyalgia, HTN, HLD, CAD, hypothyroidism, IBS, T2DM, GERD with stricture, gastric ulcer, prolonged QTc, and macrocytic anemia who presented via EMS from [facility] assisted living facility for evaluation of dizziness.     Assessment and Plan:   Acute on chronic blood loss anemia - Hemoglobin 7.1 on presentation.  Iron studies suggesting severe iron deficiency anemia from chronic blood loss, iron 22, percent sat 6, ferritin 4.  S/p 1 unit PRBC.  S/p 1 dose of IV Venofer 300 mg.  Will initiate p.o. iron supplementation as well.  Continue to trend hemoglobin.  Transfuse if less than 7 with goal near 8 given symptoms.   Possible acute GI bleed - Noted heme positive stool with decreasing hemoglobin.  GI consulted and following closely.  Received EGD this morning 6/3 with noted gastritis but no obvious source of bleeding.  PPI on board.  Clear liquid diet initiated.   Vitamin B12 deficiency - Likely contributing to patient's anemia.  S/p IV B12 x 1.  Initiated on daily B12 supplementation as well.   Hypovolemic hypotension - Systolic blood pressures in the 70s on presentation with noted dizziness.  Responded well to IV fluid resuscitation and PRBC.  Hypotension appears to be resolved at this time.   Diabetes mellitus - Insulin  sliding scale on board.  A1c 6.8 suggesting great control.   Hypothyroidism - Patient takes Synthroid  at baseline.  TSH mildly low with mildly elevated T4 suggesting possible mild hyperthyroidism.  May need to adjust Synthroid  dose in the outpatient setting.   Anxiety/depression - Continue fluoxetine /doxepin .   Subjective: Patient just returned from EGD this morning.  Lethargic but alert and answering questions.  Denies any  shortness of breath, pain, nausea, vomiting.  Physical Exam:  Vitals:   03/10/24 0925 03/10/24 0930 03/10/24 0935 03/10/24 0940  BP: (!) 109/52  (!) 108/53   Pulse: 82 82 82 83  Resp: 16 18 17 17   Temp:      TempSrc:      SpO2: 100% 98% 98% 97%  Weight:      Height:        GENERAL:  Alert, pleasant, no acute distress  HEENT:  EOMI CARDIOVASCULAR:  RRR, no murmurs appreciated RESPIRATORY:  Clear to auscultation, no wheezing, rales, or rhonchi GASTROINTESTINAL:  Soft, nontender, nondistended EXTREMITIES:  No LE edema bilaterally NEURO:  No new focal deficits appreciated SKIN:  No rashes noted PSYCH:  Appropriate mood and affect    Data Reviewed:  No new imaging to review  Previous records (including but not limited to H&P, progress notes, nursing notes, TOC management) were reviewed in assessment of this patient.  Labs: CBC: Recent Labs  Lab 03/08/24 2030 03/09/24 0152 03/09/24 1027 03/09/24 1748 03/10/24 0341  WBC 9.4 9.2  --   --   --   NEUTROABS 5.0  --   --   --   --   HGB 7.2* 7.1* 8.6* 9.0* 9.0*  HCT 26.2* 25.1* 29.1* 31.4* 31.3*  MCV 72.8* 72.3*  --   --   --   PLT 353 305  --   --   --    Basic Metabolic Panel: Recent Labs  Lab 03/08/24 2030 03/09/24 0152 03/10/24 0341  NA 135 136 137  K 3.6 3.4* 3.5  CL 101 101 102  CO2 23 25 25   GLUCOSE 59* 58* 115*  BUN 13 12 6*  CREATININE 0.82 0.72 0.73  CALCIUM  7.6* 7.5* 8.0*  MG  --   --  1.2*   Liver Function Tests: Recent Labs  Lab 03/08/24 2030  AST 23  ALT 14  ALKPHOS 41  BILITOT 0.9  PROT 5.5*  ALBUMIN  2.7*   CBG: Recent Labs  Lab 03/09/24 0426  GLUCAP 70    Scheduled Meds:  atorvastatin   20 mg Oral Daily   vitamin B-12  1,000 mcg Oral Daily   doxepin   10 mg Oral QHS   empagliflozin   25 mg Oral Daily   ferrous sulfate   325 mg Oral Q breakfast   FLUoxetine   40 mg Oral Daily   levothyroxine   125 mcg Oral Q0600   pantoprazole  (PROTONIX ) IV  40 mg Intravenous Q12H   potassium  chloride SA  20 mEq Oral Daily   sodium chloride  flush  10-40 mL Intracatheter Q12H   Continuous Infusions: PRN Meds:.acetaminophen  **OR** acetaminophen , melatonin, ondansetron  **OR** ondansetron  (ZOFRAN ) IV, senna-docusate, sodium chloride  flush  Family Communication: None at bedside  Disposition: Status is: Inpatient Remains inpatient appropriate because: GI bleed     Time spent: 38 minutes  Length of inpatient stay: 2 days  Author: Jodeane Mulligan, DO 03/10/2024 2:14 PM  For on call review [email or web address].

## 2024-03-10 NOTE — H&P (Signed)
 [facility] History and Physical   Primary Care Physician:  Veda Gerald, MD   Reason for Procedure:  Symptomatic anemia  Plan:    Upper endoscopy and possible small bowel enteroscopy     HPI: Jeanne Kim is a 83 y.o. female undergoing upper endoscopy for evaluation of symptomatic anemia.  Patient was admitted to the hospital with hemoglobin 7.2 down from previous value of 9.7.  Denied overt melena or hematochezia.  Describes chronic abdominal pain that has not changed. Last EGD/colonoscopy performed in 2022 with no Cameron erosions; gastric polyps and ischemic colitis present Takes aspirin  and Aleve. Not on anticoagulation.    Past Medical History:  Diagnosis Date   Allergy    Anemia    Anginal pain (HCC)    Anxiety    Arthritis    Blood transfusion without reported diagnosis    Breast cancer (HCC)    Carotid artery plaque    bilat   Cataract    Colon polyps    adenomatous   Concussion 02/2016   Driving restrictions for 6w   Coronary heart disease    Depression    Diverticulosis    Dizziness    Esophageal reflux    Esophageal stricture    Fibromyalgia    Gallstones    Hiatal hernia    Hypercholesterolemia    Hypertension    Hypothyroidism    IBS (irritable bowel syndrome)    Kidney stones    Osteoporosis    Pneumonia    recently, just finished ABO   PONV (postoperative nausea and vomiting)    Sleep apnea    CPAP   Thyroid  disease 2002   Type II or unspecified type diabetes mellitus without mention of complication, not stated as uncontrolled    Unspecified gastritis and gastroduodenitis without mention of hemorrhage     Past Surgical History:  Procedure Laterality Date   ANAL RECTAL MANOMETRY N/A 08/05/2015   Procedure: ANO RECTAL MANOMETRY;  Surgeon: Sergio Dandy, MD;  Location: [facility];  Service: Endoscopy;  Laterality: N/A;   APPENDECTOMY     BIOPSY  08/06/2020   Procedure: BIOPSY;  Surgeon: Vinetta Greening, DO;   Location: [facility];  Service: Endoscopy;;   BIOPSY  12/21/2020   Procedure: BIOPSY;  Surgeon: Lindle Rhea, MD;  Location: [facility];  Service: Gastroenterology;;   BIOPSY  12/23/2020   Procedure: BIOPSY;  Surgeon: Lindle Rhea, MD;  Location: [facility];  Service: Gastroenterology;;   BREAST LUMPECTOMY     left   CARDIAC CATHETERIZATION     CATARACT EXTRACTION W/PHACO Left 03/30/2013   Procedure: CATARACT EXTRACTION PHACO AND INTRAOCULAR LENS PLACEMENT (IOC);  Surgeon: Anner Kill, MD;  Location: [facility];  Service: Ophthalmology;  Laterality: Left;  CDE:19.28   CATARACT EXTRACTION W/PHACO Right 04/09/2013   Procedure: CATARACT EXTRACTION PHACO AND INTRAOCULAR LENS PLACEMENT (IOC);  Surgeon: Anner Kill, MD;  Location: [facility];  Service: Ophthalmology;  Laterality: Right;  CDE: 15.44   CERVICAL FUSION     CESAREAN SECTION     CHOLECYSTECTOMY     COLONOSCOPY WITH PROPOFOL  N/A 12/23/2020   Procedure: COLONOSCOPY WITH PROPOFOL ;  Surgeon: Lindle Rhea, MD;  Location: [facility];  Service: Gastroenterology;  Laterality: N/A;   CYSTOCELE REPAIR     DILATION AND CURETTAGE OF UTERUS     ESOPHAGOGASTRODUODENOSCOPY (EGD) WITH PROPOFOL  N/A 08/06/2020   Procedure: ESOPHAGOGASTRODUODENOSCOPY (EGD) WITH PROPOFOL ;  Surgeon: Vinetta Greening, DO;  [facility];  Service: Endoscopy;  Laterality: N/A;   ESOPHAGOGASTRODUODENOSCOPY (EGD) WITH PROPOFOL  N/A 12/21/2020   Procedure: ESOPHAGOGASTRODUODENOSCOPY (EGD) WITH PROPOFOL ;  Surgeon: Lindle Rhea, MD;  Location: [facility];  Service: Gastroenterology;  Laterality: N/A;   EYE SURGERY Left 03-30-13   Cataract   EYE SURGERY Right 04-09-13   Cataract   RECTOCELE REPAIR     THYROIDECTOMY     TONSILLECTOMY     TOTAL ABDOMINAL HYSTERECTOMY      Prior to Admission medications   Medication Sig Start Date End Date Taking? Authorizing Provider  amLODipine  (NORVASC ) 2.5 MG tablet Take 2.5 mg by mouth daily. 02/07/24  Yes [provider]  ASPIRIN  LOW DOSE 81 MG tablet Take 81 mg by mouth every morning. 04/17/22  Yes [provider]  atorvastatin  (LIPITOR) 20 MG tablet Take 20 mg by mouth every evening. 08/28/22  Yes [provider]  doxepin  (SINEQUAN ) 10 MG capsule Take 10 mg by mouth at bedtime. 08/28/22  Yes [provider]  DULoxetine  (CYMBALTA ) 60 MG capsule Take 60 mg by mouth daily. 02/07/24  Yes [provider]  EPINEPHrine  0.3 mg/0.3 mL IJ SOAJ injection Inject 0.3 mg into the muscle daily as needed for anaphylaxis. 07/18/09  Yes [provider]  furosemide  (LASIX ) 20 MG tablet Take 1 tablet (20 mg total) by mouth daily. 06/01/22  Yes Marilyne Shu, NP  Insulin  Glargine-Lixisenatide  (SOLIQUA ) 100-33 UNT-MCG/ML SOPN Inject 20 Units into the skin daily. Sliding Scale 06/01/22  Yes Marilyne Shu, NP  JARDIANCE  25 MG TABS tablet Take 1 tablet (25 mg total) by mouth daily. 06/01/22  Yes Marilyne Shu, NP  levothyroxine  (SYNTHROID ) 125 MCG tablet Take 125 mcg by mouth daily. 02/07/24  Yes [provider]  metFORMIN (GLUCOPHAGE) 1000 MG tablet Take 1,000 mg by mouth 2 (two) times daily. 02/07/24  Yes [provider]  metoprolol  succinate (TOPROL -XL) 25 MG 24 hr tablet Take 25 mg by mouth daily. 02/07/24  Yes [provider]  midodrine  (PROAMATINE ) 2.5 MG tablet Take 2.5 mg by mouth 3 (three) times daily. 02/07/24  Yes [provider]  pantoprazole  (PROTONIX ) 40 MG tablet Take 40 mg by mouth daily. 02/11/24  Yes [provider]  pioglitazone (ACTOS) 45 MG tablet Take 45 mg by mouth daily. 02/07/24  Yes [provider]  potassium chloride  SA (KLOR-CON  M) 20 MEQ tablet Take 1 tablet (20 mEq total) by mouth daily. 09/16/22  Yes Shahmehdi, Seyed A, MD  ramipril  (ALTACE ) 5 MG capsule Take 5 mg by mouth daily. 02/07/24  Yes [provider]    Current Facility-Administered Medications  Medication Dose Route Frequency Provider  Last Rate Last Admin   [MAR Hold] 0.9 %  sodium chloride  infusion (Manually program via Guardrails IV Fluids)   Intravenous Once Jodeane Mulligan, DO       [MAR Hold] acetaminophen  (TYLENOL ) tablet 650 mg  650 mg Oral Q6H PRN Amponsah, Prosper M, MD       Or   Evette Hoes Hold] acetaminophen  (TYLENOL ) suppository 650 mg  650 mg Rectal Q6H PRN Vita Grip, MD       [MAR Hold] atorvastatin  (LIPITOR) tablet 20 mg  20 mg Oral Daily Amponsah, Prosper M, MD   20 mg at 03/09/24 0953   [MAR Hold] cyanocobalamin  (VITAMIN B12) tablet 1,000 mcg  1,000 mcg Oral Daily Jodeane Mulligan, DO   1,000 mcg at 03/09/24 0954   [MAR Hold] doxepin  (SINEQUAN ) capsule 10 mg  10 mg Oral QHS Amponsah, Prosper M,  MD   10 mg at 03/09/24 2100   [MAR Hold] empagliflozin  (JARDIANCE ) tablet 25 mg  25 mg Oral Daily Amponsah, Prosper M, MD   25 mg at 03/09/24 1114   [MAR Hold] ferrous sulfate  tablet 325 mg  325 mg Oral Q breakfast Jodeane Mulligan, DO       [facility] Hold] FLUoxetine  (PROZAC ) capsule 40 mg  40 mg Oral Daily Amponsah, Prosper M, MD   40 mg at 03/09/24 0954   [MAR Hold] levothyroxine  (SYNTHROID ) tablet 125 mcg  125 mcg Oral Q0600 Amponsah, Prosper M, MD   125 mcg at 03/09/24 0747   [MAR Hold] melatonin tablet 10 mg  10 mg Oral QHS PRN Amponsah, Prosper M, MD   10 mg at 03/09/24 2101   [MAR Hold] ondansetron  (ZOFRAN ) tablet 4 mg  4 mg Oral Q6H PRN Amponsah, Prosper M, MD       Or   Evette Hoes Hold] ondansetron  (ZOFRAN ) injection 4 mg  4 mg Intravenous Q6H PRN Amponsah, Prosper M, MD       [MAR Hold] pantoprazole  (PROTONIX ) injection 40 mg  40 mg Intravenous Q12H Amponsah, Prosper M, MD   40 mg at 03/09/24 2101   [MAR Hold] potassium chloride  SA (KLOR-CON  M) CR tablet 20 mEq  20 mEq Oral Daily Amponsah, Prosper M, MD   20 mEq at 03/09/24 0954   [MAR Hold] senna-docusate (Senokot-S) tablet 1 tablet  1 tablet Oral QHS PRN Vita Grip, MD       [facility] Hold] sodium chloride  flush (NS) 0.9 % injection 10-40 mL  10-40 mL  Intracatheter Q12H Vita Grip, MD   10 mL at 03/09/24 2102   [facility] Hold] sodium chloride  flush (NS) 0.9 % injection 10-40 mL  10-40 mL Intracatheter PRN Vita Grip, MD        Allergies as of 03/08/2024 - Review Complete 03/08/2024  Allergen Reaction Noted   Codeine Hives 04/27/2009   Sulfonamide derivatives Hives 04/27/2009   Canagliflozin  12/20/2020   Penicillins Hives 12/20/2020   Sulfa antibiotics Other (See Comments) 05/06/2014    Family History  Problem Relation Age of Onset   Sarcoidosis Mother    Diabetes Mother    Cancer Mother        sarcoma -  Right arm amputation   Hypertension Mother    Lung cancer Father    Diabetes Father    Heart disease Father        Before age 60   Urolithiasis Father    Cancer Father        Lung   Hyperlipidemia Father    Hypertension Father    Esophageal cancer Maternal Uncle    Ovarian cancer Maternal Aunt    Colon cancer Maternal Uncle    Lung cancer Maternal Uncle    Diabetes Maternal Uncle    Diabetes Maternal Aunt    Colon polyps Maternal Uncle    Heart disease Paternal Grandmother     Social History   Socioeconomic History   Marital status: Married    Spouse name: Not on file   Number of children: 4   Years of education: Not on file   Highest education level: Not on file  Occupational History   Occupation: Retired Academic librarian: RETIRED  Tobacco Use   Smoking status: Never   Smokeless tobacco: Never  Vaping Use   Vaping status: Never Used  Substance and Sexual Activity   Alcohol use: No   Drug use:  No   Sexual activity: Not on file  Other Topics Concern   Not on file  Social History Narrative   Not on file   Social Drivers of Health   Financial Resource Strain: Low Risk  (10/19/2022)   Received from [facility]   Overall Financial Resource Strain (CARDIA)    Difficulty of Paying Living Expenses: Not hard at all  Food Insecurity: Patient Declined (03/09/2024)    Hunger Vital Sign    Worried About Running Out of Food in the Last Year: Patient declined    Ran Out of Food in the Last Year: Patient declined  Transportation Needs: Patient Unable To Answer (03/10/2024)   PRAPARE - Transportation    Lack of Transportation (Medical): Patient unable to answer    Lack of Transportation (Non-Medical): Patient unable to answer  Physical Activity: Insufficiently Active (02/04/2020)   Received from [facility] visits prior to 12/08/2022., [facility] visits prior to 12/08/2022.   Exercise Vital Sign    Days of Exercise per Week: 3 days    Minutes of Exercise per Session: 30 min  Stress: No Stress Concern Present (02/04/2020)   [facility] visits prior to 12/08/2022., [facility] visits prior to 12/08/2022.   [employer] of Occupational Health - Occupational Stress Questionnaire    Feeling of Stress : Only a little  Social Connections: Unknown (03/10/2024)   Social Connection and Isolation Panel [NHANES]    Frequency of Communication with Friends and Family: Patient unable to answer    Frequency of Social Gatherings with Friends and Family: Patient unable to answer    Attends Religious Services: Not on file    Active Member of Clubs or Organizations: Patient unable to answer    Attends Banker Meetings: Patient unable to answer    Marital Status: Patient unable to answer  Intimate Partner Violence: Not At Risk (03/09/2024)   Humiliation, Afraid, Rape, and Kick questionnaire    Fear of Current or Ex-Partner: No    Emotionally Abused: No    Physically Abused: No    Sexually Abused: No    Review of Systems:  All other review of systems negative except as mentioned in the HPI.  Physical Exam: Vital signs BP 119/61   Pulse 87   Temp (!) 97.2 F (36.2 C) (Temporal)   Resp (!) 35   Ht 5\' 5"  (1.651 m)   Wt 72 kg   SpO2 94%   BMI 26.41 kg/m    General:   Alert,  Well-developed, well-nourished, pleasant and cooperative in NAD Lungs:  Clear throughout to auscultation.   Heart:  Regular rate and rhythm; no murmurs, clicks, rubs,  or gallops. Abdomen:  Soft, mild tenderness to palpation in the bilateral lower quadrants and nondistended. Normal bowel sounds.   Neuro/Psych:  Normal mood and affect. A and O x 3  Eugenia Hess, MD [facility]

## 2024-03-10 NOTE — Anesthesia Postprocedure Evaluation (Signed)
 Anesthesia Post Note  Patient: Jeanne Kim  Procedure(s) Performed: ENTEROSCOPY     Patient location during evaluation: PACU Anesthesia Type: MAC Level of consciousness: awake Pain management: pain level controlled Vital Signs Assessment: post-procedure vital signs reviewed and stable Respiratory status: spontaneous breathing, nonlabored ventilation and respiratory function stable Cardiovascular status: stable and blood pressure returned to baseline Postop Assessment: no apparent nausea or vomiting Anesthetic complications: no   No notable events documented.  Last Vitals:  Vitals:   03/10/24 0930 03/10/24 0935  BP:    Pulse: 82 82  Resp: 18 17  Temp:    SpO2: 98% 98%    Last Pain:  Vitals:   03/10/24 0914  TempSrc: (P) Temporal  PainSc: (P) Asleep                 Zaryah Seckel A.

## 2024-03-10 NOTE — Op Note (Signed)
 [facility] Patient Name: Jeanne Kim Procedure Date : 03/10/2024 MRN: [id number] Attending MD: Eugenia Hess , MD, [id number] Date of Birth: [birth year] CSN: [id number] Age: 83 Admit Type: Inpatient Procedure:                Small bowel enteroscopy Indications:              Iron deficiency anemia, No overt GI bleeding Providers:                Eugenia Hess, MD, Golda Latch, RN, Alfredia Ina, Technician Referring MD:              Medicines:                Monitored Anesthesia Care Complications:            No immediate complications. Estimated blood loss:                            Minimal. Estimated Blood Loss:     Estimated blood loss was minimal. Procedure:                Pre-Anesthesia Assessment:                           - Prior to the procedure, a History and Physical                            was performed, and patient medications and                            allergies were reviewed. The patient's tolerance of                            previous anesthesia was also reviewed. The risks                            and benefits of the procedure and the sedation                            options and risks were discussed with the patient.                            All questions were answered, and informed consent                            was obtained. Prior Anticoagulants: The patient has                            taken no anticoagulant or antiplatelet agents. ASA                            Grade Assessment: III - A patient with severe  systemic disease. After reviewing the risks and                            benefits, the patient was deemed in satisfactory                            condition to undergo the procedure.                           After obtaining informed consent, the endoscope was                            passed under direct vision. Throughout the                            procedure,  the patient's blood pressure, pulse, and                            oxygen saturations were monitored continuously. The                            [id number]-H190 ([id number]) Olympus endoscope was introduced                            through the mouth and advanced to the second part                            of duodenum. Only gastritis was seen during EGD.                            Decision was made to extend procedure to a small                            bowel enteroscopy to further evaluate the proximal                            small bowel for potential culprit lesions. The                            [id number]-H190TL ([id number]) Olympus slim colonoscope was                            introduced through the mouth and advanced to the                            proximal jejunum. The small bowel enteroscopy was                            accomplished without difficulty. The patient                            tolerated the procedure well. Scope In: 8:45:33 AM Scope Out: 9:01:21 AM Total Procedure Duration: 0 hours 15 minutes 48 seconds  Findings:  The examined esophagus was moderately tortuous. The mucosa of the       esophagus was normal      Patchy mildly erythematous mucosa was found in the gastric antrum.       Biopsies were taken with a cold forceps for histology. Evaluate for H.       pylori.      Localized mild inflammation characterized by congestion (edema),       erythema and friability was found in the gastric fundus. Biopsies were       taken with a cold forceps for histology. Evaluate for H. pylori.      The gastric body and cardia (on retroflexion) were normal.      Multiple 3 to 7 mm sessile polyps with no stigmata of recent bleeding       were found in the gastric fundus and in the gastric body.      A medium-sized hiatal hernia was present. No Cameron's erosions were       noted      There was no evidence of significant pathology in the entire examined       duodenum.       There was no evidence of significant pathology in the proximal jejunum.      No active bleeding or stigmata of recent bleeding was seen during the       upper endoscopy or small bowel enteroscopy. Impression:               - Tortuous esophagus.                           - Erythematous mucosa in the antrum. Biopsied.                           - Gastritis. Biopsied. No definite Cameron's                            erosions. Gastritis was present in the hiatal                            hernia sac. This is a potential source of recent                            bleeding that could explain the patient's anemia.                           - Normal gastric body and cardia.                           - Multiple gastric polyps.                           - Medium-sized hiatal hernia.                           - Normal examined duodenum.                           - The examined portion of the jejunum was normal. Recommendation:           -  Return patient to hospital ward for ongoing care.                           - Await pathology results.                           - In the absence of active bleeding can transition                            pantoprazole  to 40 mg p.o. twice daily for                            management of gastritis seen during EGD.                           - In the absence of overt bleeding may start a                            clear liquid diet and advance as tolerated.                           - Continue to monitor serial hemoglobin hematocrit.                            If there is further decline in H&H would consider                            possibility of colonoscopy. If H&H remains stable                            no need for colonoscopy this hospitalization.                           - The findings and recommendations were discussed                            with the patient. Procedure Code(s):        --- Professional ---                           [phone], Small  intestinal endoscopy, enteroscopy                            beyond second portion of duodenum, not including                            ileum; with biopsy, single or multiple Diagnosis Code(s):        --- Professional ---                           Q39.9, Congenital malformation of esophagus,                            unspecified  K31.89, Other diseases of stomach and duodenum                           K29.70, Gastritis, unspecified, without bleeding                           K31.7, Polyp of stomach and duodenum                           K44.9, Diaphragmatic hernia without obstruction or                            gangrene                           D50.9, Iron deficiency anemia, unspecified CPT copyright 2022 American Medical Association. All rights reserved. The codes documented in this report are preliminary and upon coder review may  be revised to meet current compliance requirements. Eugenia Hess, MD 03/10/2024 9:23:43 AM This report has been signed electronically. Number of Addenda: 0

## 2024-03-10 NOTE — Anesthesia Preprocedure Evaluation (Signed)
 Anesthesia Evaluation  Patient identified by MRN, date of birth, ID band Patient awake    Reviewed: Allergy & Precautions, NPO status , Patient's Chart, lab work & pertinent test results, reviewed documented beta blocker date and time   History of Anesthesia Complications (+) PONV and history of anesthetic complications  Airway Mallampati: III       Dental no notable dental hx. (+) Teeth Intact, Dental Advisory Given   Pulmonary shortness of breath, sleep apnea , pneumonia, resolved   Pulmonary exam normal breath sounds clear to auscultation       Cardiovascular hypertension, Pt. on medications + angina  + CAD  Normal cardiovascular exam Rhythm:Regular Rate:Normal     Neuro/Psych  PSYCHIATRIC DISORDERS Anxiety Depression       GI/Hepatic Neg liver ROS, hiatal hernia, PUD,GERD  Medicated,,Heme + stool   Endo/Other  diabetesHypothyroidism    Renal/GU Renal diseaseHx/o renal calculi  negative genitourinary   Musculoskeletal  (+) Arthritis ,  Fibromyalgia -  Abdominal   Peds  Hematology  (+) Blood dyscrasia, anemia   Anesthesia Other Findings   Reproductive/Obstetrics                              Anesthesia Physical Anesthesia Plan  ASA: 3  Anesthesia Plan: MAC   Post-op Pain Management: Minimal or no pain anticipated   Induction: Intravenous  PONV Risk Score and Plan: 3 and Treatment may vary due to age or medical condition and Propofol  infusion  Airway Management Planned: Nasal Cannula, Simple Face Mask and Natural Airway  Additional Equipment: None  Intra-op Plan:   Post-operative Plan:   Informed Consent: I have reviewed the patients History and Physical, chart, labs and discussed the procedure including the risks, benefits and alternatives for the proposed anesthesia with the patient or authorized representative who has indicated his/her understanding and acceptance.      Dental advisory given  Plan Discussed with: CRNA and Anesthesiologist  Anesthesia Plan Comments:          Anesthesia Quick Evaluation

## 2024-03-10 NOTE — Plan of Care (Signed)
   Problem: Education: Goal: Knowledge of General Education information will improve Description: Including pain rating scale, medication(s)/side effects and non-pharmacologic comfort measures Outcome: Progressing   Problem: Activity: Goal: Risk for activity intolerance will decrease Outcome: Progressing   Problem: Nutrition: Goal: Adequate nutrition will be maintained Outcome: Progressing

## 2024-03-10 NOTE — Transfer of Care (Signed)
 Immediate Anesthesia Transfer of Care Note  Patient: Jeanne Kim  Procedure(s) Performed: ENTEROSCOPY  Patient Location: PACU  Anesthesia Type:MAC  Level of Consciousness: awake and alert   Airway & Oxygen Therapy: Patient Spontanous Breathing and Patient connected to face mask oxygen  Post-op Assessment: Report given to RN and Post -op Vital signs reviewed and stable  Post vital signs: Reviewed and stable  Last Vitals:  Vitals Value Taken Time  BP 94/36 03/10/24 0913  Temp    Pulse 83 03/10/24 0918  Resp 17 03/10/24 0918  SpO2 100 % 03/10/24 0918  Vitals shown include unfiled device data.  Last Pain:  Vitals:   03/10/24 0914  TempSrc: (P) Temporal  PainSc: (P) Asleep         Complications: No notable events documented.

## 2024-03-11 DIAGNOSIS — D500 Iron deficiency anemia secondary to blood loss (chronic): Secondary | ICD-10-CM | POA: Diagnosis not present

## 2024-03-11 DIAGNOSIS — K2950 Unspecified chronic gastritis without bleeding: Secondary | ICD-10-CM | POA: Diagnosis not present

## 2024-03-11 DIAGNOSIS — D509 Iron deficiency anemia, unspecified: Secondary | ICD-10-CM | POA: Diagnosis not present

## 2024-03-11 DIAGNOSIS — K449 Diaphragmatic hernia without obstruction or gangrene: Secondary | ICD-10-CM | POA: Diagnosis not present

## 2024-03-11 LAB — BASIC METABOLIC PANEL WITH GFR
Anion gap: 12 (ref 5–15)
BUN: 6 mg/dL — ABNORMAL LOW (ref 8–23)
CO2: 27 mmol/L (ref 22–32)
Calcium: 8.6 mg/dL — ABNORMAL LOW (ref 8.9–10.3)
Chloride: 100 mmol/L (ref 98–111)
Creatinine, Ser: 0.73 mg/dL (ref 0.44–1.00)
GFR, Estimated: >60 mL/min (ref >60)
Glucose, Bld: 140 mg/dL — ABNORMAL HIGH (ref 70–99)
Potassium: 3.3 mmol/L — ABNORMAL LOW (ref 3.5–5.1)
Sodium: 139 mmol/L (ref 135–145)

## 2024-03-11 LAB — HEMOGLOBIN AND HEMATOCRIT, BLOOD
HCT: 37.7 % (ref 36.0–46.0)
Hemoglobin: 10.8 g/dL — ABNORMAL LOW (ref 12.0–15.0)

## 2024-03-11 LAB — CBC
HCT: 31.5 % — ABNORMAL LOW (ref 36.0–46.0)
Hemoglobin: 8.9 g/dL — ABNORMAL LOW (ref 12.0–15.0)
MCH: 21 pg — ABNORMAL LOW (ref 26.0–34.0)
MCHC: 28.3 g/dL — ABNORMAL LOW (ref 30.0–36.0)
MCV: 74.3 fL — ABNORMAL LOW (ref 80.0–100.0)
Platelets: 332 10*3/uL (ref 150–400)
RBC: 4.24 MIL/uL (ref 3.87–5.11)
RDW: 19.2 % — ABNORMAL HIGH (ref 11.5–15.5)
WBC: 8.5 10*3/uL (ref 4.0–10.5)
nRBC: 0 % (ref 0.0–0.2)

## 2024-03-11 MED ORDER — PANTOPRAZOLE SODIUM 40 MG PO TBEC
40.0000 mg | DELAYED_RELEASE_TABLET | Freq: Two times a day (BID) | ORAL | Status: DC
  Administered 2024-03-11 – 2024-03-12 (×3): 40 mg via ORAL
  Filled 2024-03-11 (×3): qty 1

## 2024-03-11 MED ORDER — POTASSIUM CHLORIDE CRYS ER 20 MEQ PO TBCR
40.0000 meq | EXTENDED_RELEASE_TABLET | ORAL | Status: AC
  Administered 2024-03-11 (×2): 40 meq via ORAL
  Filled 2024-03-11 (×2): qty 2

## 2024-03-11 MED ORDER — CYANOCOBALAMIN 1000 MCG/ML IJ SOLN
1000.0000 ug | Freq: Every day | INTRAMUSCULAR | Status: DC
  Administered 2024-03-11 – 2024-03-12 (×2): 1000 ug via SUBCUTANEOUS
  Filled 2024-03-11 (×2): qty 1

## 2024-03-11 NOTE — Care Management Important Message (Signed)
 Important Message  Patient Details  Name: Jeanne Kim MRN: [id number] Date of Birth: [birth year]   Important Message Given:  Yes - Medicare IM     Felix Host 03/11/2024, 11:06 AM

## 2024-03-11 NOTE — TOC Initial Note (Addendum)
 Transition of Care [facility]) - Initial/Assessment Note    Patient Details  Name: Jeanne Kim MRN: [id number] Date of Birth: 1941/02/09  Transition of Care [facility]) CM/SW Contact:    Elspeth Hals, LCSW Phone Number: 03/11/2024, 2:34 PM  Clinical Narrative:  CSW spoke with pt and daughter [name] regarding PT recommendation for [facility].  Pt confirms she is from ALF at [facility] and does plan to return. Pt having PT once per week currently.  Husband currently at [facility].  Permission given to speak with daughters [name] and [name].  [name] works at [employer]. Current DME at [facility]: walker and wheelchair.          CSW spoke with [name]/[name] [phone].  She needs FL2 and DC summary sent to: [email or web address]          Expected Discharge Plan: Assisted Living [facility]) Barriers to Discharge: Continued Medical Work up   Patient Goals and CMS Choice Patient states their goals for this hospitalization and ongoing recovery are:: back to normal   Choice offered to / list presented to : Patient, Adult Children (pt and daughter confirm plan to return to [facility])      Expected Discharge Plan and Services In-house Referral: Clinical Social Work   Post Acute Care Choice: Resumption of Svcs/PTA Provider [facility]) Living arrangements for the past 2 months: Assisted Living Facility                                      Prior Living Arrangements/Services Living arrangements for the past 2 months: Assisted Living Facility Lives with:: Facility Resident Patient language and need for interpreter reviewed:: Yes Do you feel safe going back to the place where you live?: Yes      Need for Family Participation in Patient Care: Yes (Comment) Care giver support system in place?: Yes (comment) Current home services: Home PT Criminal Activity/Legal Involvement Pertinent to Current Situation/Hospitalization: No - Comment as needed  Activities of Daily  Living      Permission Sought/Granted Permission sought to share information with : Family Supports Permission granted to share information with : Yes, Verbal Permission Granted  Share Information with NAME: daughters [name] and [name]  Permission granted to share info w AGENCY: [facility]        Emotional Assessment Appearance:: Appears stated age Attitude/Demeanor/Rapport: Engaged Affect (typically observed): Appropriate Orientation: : Oriented to Self, Oriented to Place, Oriented to  Time, Oriented to Situation      Admission diagnosis:  Symptomatic anemia [D64.9] Gastrointestinal hemorrhage, unspecified gastrointestinal hemorrhage type [K92.2] Patient Active Problem List   Diagnosis Date Noted   Vitamin B12 deficiency 03/09/2024   Fracture of humeral shaft, closed 11/08/2022   Trochanteric fracture of left femur (HCC) 10/30/2022   Chronic constipation 05/23/2022   Aortic atherosclerosis (HCC) 05/23/2022   Hypertension associated with type 2 diabetes mellitus (HCC) 05/23/2022   Hypotension 05/23/2022   Cognitive impairment 05/23/2022   SIRS (systemic inflammatory response syndrome) (HCC) 05/17/2022   Acute metabolic encephalopathy 05/16/2022   Generalized weakness 05/04/2021   Hypoalbuminemia due to protein-calorie malnutrition (HCC) 05/04/2021   Hyperlipidemia 05/04/2021   Prolonged QT interval 05/04/2021   Iron deficiency anemia due to chronic blood loss 12/23/2020   GI bleed 12/20/2020   Hypokalemia due to excessive gastrointestinal loss of potassium 10/31/2020   Hyperbilirubinemia 10/31/2020   Thrombocytosis 10/31/2020   Abdominal pain 10/04/2020  Fibromyalgia 10/04/2020   Proctocolitis 10/04/2020   Gastric ulcer 10/04/2020   History of gastrointestinal bleeding 10/04/2020   Intracranial bleed (HCC) 09/28/2020   AKI (acute kidney injury) (HCC) 09/28/2020   Upper GI bleed 08/05/2020   Hypokalemia 03/25/2020   Hypomagnesemia 03/22/2020   Insomnia 03/22/2020    MDD (major depressive disorder) 03/22/2020   Status post total replacement of left hip 03/21/2020   S/P musculoskeletal system surgery 03/21/2020   At risk for falls 02/24/2020   Chronic, continuous use of opioids 02/24/2020   Senile osteoporosis 02/24/2020   History of radiation therapy 02/24/2020   Leukemoid reaction 02/06/2020   Microcytic anemia 02/05/2020   Electrolyte abnormality 02/05/2020   Bilateral lower extremity edema 02/04/2020   Pathological fracture of left hip due to age-related osteoporosis (HCC) 02/04/2020   History of fracture of left hip 02/03/2020   Malignant neoplasm of breast (HCC) 07/02/2013   Occlusion and stenosis of carotid artery without mention of cerebral infarction 04/28/2013   Carotid stenosis 01/20/2013   Autonomic orthostatic hypotension 10/17/2012   Postural hypotension 10/17/2012   Dyspnea 08/27/2012   Syncope 02/17/2012   History of colonic polyps 12/12/2011   Diverticulosis of colon 12/12/2011   Gastritis 12/12/2011   GERD with stricture 12/12/2011   Gastroesophageal reflux disease 11/27/2011   IBS (irritable bowel syndrome) 11/27/2011   S/P cholecystectomy 11/27/2011   Chest pain 12/09/2009   History of breast cancer 12/09/2009   Hypothyroidism 04/27/2009   OVERWEIGHT/OBESITY 04/07/2009   Essential hypertension 04/07/2009   CAD (coronary artery disease) 04/07/2009   Irritable bowel syndrome 04/07/2009   DEGENERATIVE DISC DISEASE 04/07/2009   Uncontrolled type 2 diabetes mellitus with hyperglycemia, with long-term current use of insulin  (HCC) 05/04/2008   Thyroid  nodule 05/04/2008   PCP:  Veda Gerald, MD Pharmacy:   [facility] - [place], [place] - [address] [place] [address] Phone: [phone] Fax: [phone]  [facility] - [place], [place] - [address] [place] [address] Phone: [phone] Fax: [phone]     Social Drivers of  Health (SDOH) Social History: SDOH Screenings   Food Insecurity: Patient Declined (03/09/2024)  Housing: Low Risk  (03/09/2024)  Transportation Needs: Patient Unable To Answer (03/10/2024)  Utilities: Patient Unable To Answer (03/10/2024)  Financial Resource Strain: Low Risk  (10/19/2022)   Received from [facility], [facility]  Physical Activity: Insufficiently Active (02/04/2020)   Received from [facility] visits prior to 12/08/2022., [facility] visits prior to 12/08/2022.  Social Connections: Unknown (03/10/2024)  Stress: No Stress Concern Present (02/04/2020)   Received from [facility] visits prior to 12/08/2022., [facility] visits prior to 12/08/2022.  Tobacco Use: Low Risk  (03/09/2024)   SDOH Interventions: Housing Interventions: Patient Declined, Intervention Not Indicated   Readmission Risk Interventions    09/13/2022    1:16 PM 05/16/2022   11:25 AM  Readmission Risk Prevention Plan  Transportation Screening Complete Complete  HRI or Home Care Consult Complete   Social Work Consult for Recovery Care Planning/Counseling Complete   Palliative Care Screening Not Applicable   Medication Review Oceanographer) Complete Complete  HRI or Home Care Consult  Complete  SW Recovery Care/Counseling Consult  Complete  Palliative Care Screening  Not Applicable  Skilled Nursing Facility  Not Applicable

## 2024-03-11 NOTE — Progress Notes (Incomplete)
 Patient ID: Jeanne Kim, female   DOB: [birth year], 83 y.o.   MRN: [id number]    Progress Note   Subjective  Day # 2 CC; Labs; hemoglobin 8.9/hematocrit 31.5/MCV 74.3 stable  EGD/enteroscopy-patchy gastritis in the antrum, biopsies done, localized mild inflammation/congestion erythema and friability gastric fundus biopsies done and pending   Objective   Vital signs in last 24 hours: Temp:  [97.7 F (36.5 C)-98.4 F (36.9 C)] 98 F (36.7 C) (06/04 0722) Pulse Rate:  [88-97] 97 (06/04 0722) Resp:  [14-18] 16 (06/04 0722) BP: (106-127)/(50-54) 118/52 (06/04 0722) SpO2:  [91 %-95 %] 95 % (06/04 0722) Last BM Date : 03/09/24 General:    white female in NAD Heart:  Regular rate and rhythm; no murmurs Lungs: Respirations even and unlabored, lungs CTA bilaterally Abdomen:  Soft, nontender and nondistended. Normal bowel sounds. Extremities:  Without edema. Neurologic:  Alert and oriented,  grossly normal neurologically. Psych:  Cooperative. Normal mood and affect.  Intake/Output from previous day: No intake/output data recorded. Intake/Output this shift: No intake/output data recorded.  Lab Results: Recent Labs    03/08/24 2030 03/09/24 0152 03/09/24 1027 03/10/24 0341 03/10/24 1657 03/11/24 0316  WBC 9.4 9.2  --   --   --  8.5  HGB 7.2* 7.1*   < > 9.0* 9.0* 8.9*  HCT 26.2* 25.1*   < > 31.3* 31.2* 31.5*  PLT 353 305  --   --   --  332   < > = values in this interval not displayed.   BMET Recent Labs    03/09/24 0152 03/10/24 0341 03/11/24 0316  NA 136 137 139  K 3.4* 3.5 3.3*  CL 101 102 100  CO2 25 25 27   GLUCOSE 58* 115* 140*  BUN 12 6* 6*  CREATININE 0.72 0.73 0.73  CALCIUM  7.5* 8.0* 8.6*   LFT Recent Labs    03/08/24 2030  PROT 5.5*  ALBUMIN  2.7*  AST 23  ALT 14  ALKPHOS 41  BILITOT 0.9   PT/INR No results for input(s): "LABPROT", "INR" in the last 72 hours.  Studies/Results: No results found.     Assessment / Plan:    ***   Discharge Planning Diet:** Anticoagulation and antiplatelets:*** Discharge Medications: *** Follow up: ***  Procedure Procedures planned and timing of procedure: Lab work ordered: *** Hold the following anticoagulation and/or antiplatelets for procedure? ***      Principal Problem:   Iron deficiency anemia due to chronic blood loss Active Problems:   GI bleed   Vitamin B12 deficiency     LOS: 3 days   Jeanne Kim  03/11/2024, 12:47 PM

## 2024-03-11 NOTE — Plan of Care (Signed)
   Problem: Education: Goal: Knowledge of General Education information will improve Description: Including pain rating scale, medication(s)/side effects and non-pharmacologic comfort measures Outcome: Progressing   Problem: Nutrition: Goal: Adequate nutrition will be maintained Outcome: Progressing

## 2024-03-11 NOTE — NC FL2 (Signed)
 [place]  MEDICAID FL2 LEVEL OF CARE FORM     IDENTIFICATION  Patient Name: Jeanne Kim Birthdate: [birth year] Sex: female Admission Date (Current Location): 03/08/2024  [facility] and [place] Number:  Producer, television/film/video and Address:  [facility] [place], [address], [place], [place] [address]      Provider Number: [id number]  Attending Physician Name and Address:  Macdonald Savoy, MD  Relative Name and Phone Number:  Moreen, Piggott   [phone]    Current Level of Care: Hospital Recommended Level of Care: Assisted Living Facility [facility]) Prior Approval Number:    Date Approved/Denied:   PASRR Number:    Discharge Plan: Other (Comment) [facility])    Current Diagnoses: Patient Active Problem List   Diagnosis Date Noted   Vitamin B12 deficiency 03/09/2024   Fracture of humeral shaft, closed 11/08/2022   Trochanteric fracture of left femur (HCC) 10/30/2022   Chronic constipation 05/23/2022   Aortic atherosclerosis (HCC) 05/23/2022   Hypertension associated with type 2 diabetes mellitus (HCC) 05/23/2022   Hypotension 05/23/2022   Cognitive impairment 05/23/2022   SIRS (systemic inflammatory response syndrome) (HCC) 05/17/2022   Acute metabolic encephalopathy 05/16/2022   Generalized weakness 05/04/2021   Hypoalbuminemia due to protein-calorie malnutrition (HCC) 05/04/2021   Hyperlipidemia 05/04/2021   Prolonged QT interval 05/04/2021   Iron deficiency anemia due to chronic blood loss 12/23/2020   GI bleed 12/20/2020   Hypokalemia due to excessive gastrointestinal loss of potassium 10/31/2020   Hyperbilirubinemia 10/31/2020   Thrombocytosis 10/31/2020   Abdominal pain 10/04/2020   Fibromyalgia 10/04/2020   Proctocolitis 10/04/2020   Gastric ulcer 10/04/2020   History of gastrointestinal bleeding 10/04/2020   Intracranial bleed (HCC) 09/28/2020   AKI (acute kidney injury) (HCC) 09/28/2020   Upper GI bleed 08/05/2020    Hypokalemia 03/25/2020   Hypomagnesemia 03/22/2020   Insomnia 03/22/2020   MDD (major depressive disorder) 03/22/2020   Status post total replacement of left hip 03/21/2020   S/P musculoskeletal system surgery 03/21/2020   At risk for falls 02/24/2020   Chronic, continuous use of opioids 02/24/2020   Senile osteoporosis 02/24/2020   History of radiation therapy 02/24/2020   Leukemoid reaction 02/06/2020   Microcytic anemia 02/05/2020   Electrolyte abnormality 02/05/2020   Bilateral lower extremity edema 02/04/2020   Pathological fracture of left hip due to age-related osteoporosis (HCC) 02/04/2020   History of fracture of left hip 02/03/2020   Malignant neoplasm of breast (HCC) 07/02/2013   Occlusion and stenosis of carotid artery without mention of cerebral infarction 04/28/2013   Carotid stenosis 01/20/2013   Autonomic orthostatic hypotension 10/17/2012   Postural hypotension 10/17/2012   Dyspnea 08/27/2012   Syncope 02/17/2012   History of colonic polyps 12/12/2011   Diverticulosis of colon 12/12/2011   Gastritis 12/12/2011   GERD with stricture 12/12/2011   Gastroesophageal reflux disease 11/27/2011   IBS (irritable bowel syndrome) 11/27/2011   S/P cholecystectomy 11/27/2011   Chest pain 12/09/2009   History of breast cancer 12/09/2009   Hypothyroidism 04/27/2009   OVERWEIGHT/OBESITY 04/07/2009   Essential hypertension 04/07/2009   CAD (coronary artery disease) 04/07/2009   Irritable bowel syndrome 04/07/2009   DEGENERATIVE DISC DISEASE 04/07/2009   Uncontrolled type 2 diabetes mellitus with hyperglycemia, with long-term current use of insulin  (HCC) 05/04/2008   Thyroid  nodule 05/04/2008    Orientation RESPIRATION BLADDER Height & Weight     Self, Time, Situation, Place  Normal Continent Weight: 158 lb 11.7 oz (72 kg) Height:  5\' 5"  (165.1 cm)  BEHAVIORAL SYMPTOMS/MOOD NEUROLOGICAL BOWEL NUTRITION STATUS      Continent Diet (regular diet)  AMBULATORY STATUS  COMMUNICATION OF NEEDS Skin   Supervision Verbally Normal                       Personal Care Assistance Level of Assistance  Bathing, Feeding, Dressing Bathing Assistance: Limited assistance Feeding assistance: Independent Dressing Assistance: Limited assistance     Functional Limitations Info  Sight, Hearing, Speech Sight Info: Adequate Hearing Info: Adequate Speech Info: Adequate    SPECIAL CARE FACTORS FREQUENCY  PT (By licensed PT)     PT Frequency: evaluate and treat              Contractures Contractures Info: Not present    Additional Factors Info  Code Status, Allergies Code Status Info: full Allergies Info: Codeine, Sulfonamide Derivatives, Canagliflozin, Penicillins, Sulfa Antibiotics           Current Medications (03/11/2024):  This is the current hospital active medication list Current Facility-Administered Medications  Medication Dose Route Frequency Provider Last Rate Last Admin   acetaminophen  (TYLENOL ) tablet 650 mg  650 mg Oral Q6H PRN Amponsah, Prosper M, MD       Or   acetaminophen  (TYLENOL ) suppository 650 mg  650 mg Rectal Q6H PRN Vita Grip, MD       atorvastatin  (LIPITOR) tablet 20 mg  20 mg Oral Daily Amponsah, Prosper M, MD   20 mg at 03/11/24 1054   cyanocobalamin  (VITAMIN B12) injection 1,000 mcg  1,000 mcg Subcutaneous Daily Versa Gore, Fortunato Ill, MD   1,000 mcg at 03/11/24 1057   doxepin  (SINEQUAN ) capsule 10 mg  10 mg Oral QHS Amponsah, Prosper M, MD   10 mg at 03/10/24 2037   empagliflozin  (JARDIANCE ) tablet 25 mg  25 mg Oral Daily Amponsah, Prosper M, MD   25 mg at 03/11/24 1057   ferrous sulfate  tablet 325 mg  325 mg Oral Q breakfast Jodeane Mulligan, DO   325 mg at 03/11/24 1610   FLUoxetine  (PROZAC ) capsule 40 mg  40 mg Oral Daily Amponsah, Prosper M, MD   40 mg at 03/11/24 1053   levothyroxine  (SYNTHROID ) tablet 125 mcg  125 mcg Oral Q0600 Amponsah, Prosper M, MD   125 mcg at 03/11/24 0532   melatonin tablet 10 mg  10  mg Oral QHS PRN Amponsah, Prosper M, MD   10 mg at 03/10/24 2037   ondansetron  (ZOFRAN ) tablet 4 mg  4 mg Oral Q6H PRN Vita Grip, MD       Or   ondansetron  (ZOFRAN ) injection 4 mg  4 mg Intravenous Q6H PRN Amponsah, Prosper M, MD       pantoprazole  (PROTONIX ) EC tablet 40 mg  40 mg Oral BID Macdonald Savoy, MD   40 mg at 03/11/24 1054   senna-docusate (Senokot-S) tablet 1 tablet  1 tablet Oral QHS PRN Vita Grip, MD       sodium chloride  flush (NS) 0.9 % injection 10-40 mL  10-40 mL Intracatheter Q12H Vita Grip, MD   10 mL at 03/11/24 1054   sodium chloride  flush (NS) 0.9 % injection 10-40 mL  10-40 mL Intracatheter PRN Vita Grip, MD         Discharge Medications: Please see discharge summary for a list of discharge medications.  Relevant Imaging Results:  Relevant Lab Results:   Additional Information SSN: [id number],  Merrie Abed, LCSW

## 2024-03-11 NOTE — Progress Notes (Signed)
 [facility] PROGRESS NOTE    Progress Note  Jeanne Kim  ZOX:[id number] DOB: 1941-05-23 DOA: 03/08/2024 PCP: Veda Gerald, MD     Brief Narrative:   Jeanne Kim is an 83 y.o. female past medical history of anxiety, depression, fibromyalgia hypothyroidism diabetes mellitus type 2 GERD with gastric stricture, history of gastric ulcer macrocytic anemia comes in from assisted living facility for dizziness, on admission her hemoglobin was 7.1   Assessment/Plan:   Acute blood loss anemia secondary to iron deficiency anemia due to chronic blood loss/acute GI bleed: Iron deficiency anemia panel with a ferritin of 4 status post 1 unit of packed red blood cells and IV iron. Hemoglobin this morning is 8.9. GI was consulted status post EGD on 03/10/2024 that showed gastritis but no obvious source of bleeding. Continue PPI and diet per GI. PT OT to evaluate.ne  Vitamin B12 deficiency: Continue B12 daily subcutaneously.  Hypovolemic hypotension: Responded to IV fluids now has been resolved.  Diabetes mellitus type 2: Continue sliding scale insulin  last A1c of 6.8.  Hypothyroidism: TSH is mildly low, will need to be rechecked in 6 weeks by PCP titrate medications as needed.  Anxiety/depression: Continue current meds.    DVT prophylaxis: lovenox  Family Communication:no Status is: Inpatient Remains inpatient appropriate because: Acute GI bleed    Code Status:     Code Status Orders  (From admission, onward)           Start     Ordered   03/08/24 2345  Full code  Continuous       Question:  By:  Answer:  Consent: discussion documented in EHR   03/08/24 2345           Code Status History     Date Active Date Inactive Code Status Order ID Comments User Context   09/13/2022 1353 09/16/2022 1701 Full Code [id number]  Ardeen Beath, DO Inpatient   05/16/2022 0434 05/22/2022 2157 Full Code [id number]  Vanita Gens, MD Inpatient   05/04/2021 0616 05/05/2021  1716 Full Code [id number]  Twilla Galea, DO ED   12/20/2020 1601 12/24/2020 1548 Full Code [id number]  Lesa Rape, MD ED   10/31/2020 0305 10/31/2020 1439 Full Code [id number]  Danice Dural, MD ED   09/28/2020 0818 09/29/2020 2059 Full Code [id number]  Juliette Oh, MD ED   08/05/2020 2217 08/07/2020 1914 Full Code [id number]  Elgergawy, Ardia Kraft, MD Inpatient         IV Access:   Peripheral IV   Procedures and diagnostic studies:   No results found.   Medical Consultants:   None.   Subjective:    Jeanne Kim relates she feels tired and fatigue  Objective:    Vitals:   03/10/24 1722 03/10/24 2136 03/11/24 0545 03/11/24 0722  BP: (!) 127/54 (!) 106/51 (!) 118/50 (!) 118/52  Pulse: 88 91 91 97  Resp: 14 18 18 16   Temp: 97.7 F (36.5 C) 98.4 F (36.9 C) 97.9 F (36.6 C) 98 F (36.7 C)  TempSrc:      SpO2: 94% 93% 91% 95%  Weight:      Height:       SpO2: 95 % O2 Flow Rate (L/min): 2 L/min   Intake/Output Summary (Last 24 hours) at 03/11/2024 0749 Last data filed at 03/10/2024 0919 Gross per 24 hour  Intake --  Output 0 ml  Net 0 ml   Filed Weights   03/08/24 2025 03/10/24 0812  Weight: 72 kg  72 kg    Exam: General exam: In no acute distress. Respiratory system: Good air movement and clear to auscultation. Cardiovascular system: S1 & S2 heard, RRR. No JVD. Gastrointestinal system: Abdomen is nondistended, soft and nontender.  Extremities: No pedal edema. Skin: No rashes, lesions or ulcers Psychiatry: Judgement and insight appear normal. Mood & affect appropriate.    Data Reviewed:    Labs: Basic Metabolic Panel: Recent Labs  Lab 03/08/24 2030 03/09/24 0152 03/10/24 0341 03/11/24 0316  NA 135 136 137 139  K 3.6 3.4* 3.5 3.3*  CL 101 101 102 100  CO2 23 25 25 27   GLUCOSE 59* 58* 115* 140*  BUN 13 12 6* 6*  CREATININE 0.82 0.72 0.73 0.73  CALCIUM  7.6* 7.5* 8.0* 8.6*  MG  --   --  1.2*  --    GFR Estimated Creatinine  Clearance: 55.8 mL/min (by C-G formula based on SCr of 0.73 mg/dL). Liver Function Tests: Recent Labs  Lab 03/08/24 2030  AST 23  ALT 14  ALKPHOS 41  BILITOT 0.9  PROT 5.5*  ALBUMIN  2.7*   Recent Labs  Lab 03/08/24 2030  LIPASE 24   No results for input(s): "AMMONIA" in the last 168 hours. Coagulation profile No results for input(s): "INR", "PROTIME" in the last 168 hours. COVID-19 Labs  Recent Labs    03/09/24 0152  FERRITIN 4*    Lab Results  Component Value Date   SARSCOV2NAA NEGATIVE 05/16/2022   SARSCOV2NAA NEGATIVE 05/02/2021   SARSCOV2NAA NEGATIVE 12/20/2020   SARSCOV2NAA NEGATIVE 10/31/2020    CBC: Recent Labs  Lab 03/08/24 2030 03/09/24 0152 03/09/24 1027 03/09/24 1748 03/10/24 0341 03/10/24 1657 03/11/24 0316  WBC 9.4 9.2  --   --   --   --  8.5  NEUTROABS 5.0  --   --   --   --   --   --   HGB 7.2* 7.1* 8.6* 9.0* 9.0* 9.0* 8.9*  HCT 26.2* 25.1* 29.1* 31.4* 31.3* 31.2* 31.5*  MCV 72.8* 72.3*  --   --   --   --  74.3*  PLT 353 305  --   --   --   --  332   Cardiac Enzymes: No results for input(s): "CKTOTAL", "CKMB", "CKMBINDEX", "TROPONINI" in the last 168 hours. BNP (last 3 results) No results for input(s): "PROBNP" in the last 8760 hours. CBG: Recent Labs  Lab 03/09/24 0426  GLUCAP 70   D-Dimer: No results for input(s): "DDIMER" in the last 72 hours. Hgb A1c: Recent Labs    03/09/24 0152  HGBA1C 6.8*   Lipid Profile: No results for input(s): "CHOL", "HDL", "LDLCALC", "TRIG", "CHOLHDL", "LDLDIRECT" in the last 72 hours. Thyroid  function studies: Recent Labs    03/09/24 0152  TSH 0.024*   Anemia work up: Recent Labs    03/09/24 0152  VITAMINB12 117*  FERRITIN 4*  TIBC 388  IRON 22*   Sepsis Labs: Recent Labs  Lab 03/08/24 2030 03/09/24 0152 03/11/24 0316  WBC 9.4 9.2 8.5   Microbiology Recent Results (from the past 240 hours)  MRSA Next Gen by PCR, Nasal     Status: None   Collection Time: 03/10/24  6:10 AM    Specimen: Nasal Mucosa; Nasal Swab  Result Value Ref Range Status   MRSA by PCR Next Gen NOT DETECTED NOT DETECTED Final    Comment: (NOTE) The GeneXpert MRSA Assay (FDA approved for NASAL specimens only), is one component of a comprehensive MRSA colonization surveillance  program. It is not intended to diagnose MRSA infection nor to guide or monitor treatment for MRSA infections. Test performance is not FDA approved in patients less than 69 years old. Performed at [facility], [address] [place], [place] [address]      Medications:    atorvastatin   20 mg Oral Daily   vitamin B-12  1,000 mcg Oral Daily   doxepin   10 mg Oral QHS   empagliflozin   25 mg Oral Daily   ferrous sulfate   325 mg Oral Q breakfast   FLUoxetine   40 mg Oral Daily   levothyroxine   125 mcg Oral Q0600   pantoprazole  (PROTONIX ) IV  40 mg Intravenous Q12H   potassium chloride  SA  20 mEq Oral Daily   sodium chloride  flush  10-40 mL Intracatheter Q12H   Continuous Infusions:    LOS: 3 days   Macdonald Savoy  [facility]  03/11/2024, 7:49 AM

## 2024-03-11 NOTE — H&P (Addendum)
 Inpatient Progress Note     Patient Profile/Chief Complaint  CC: Symptomatic anemia, heme positive   Brief history: 83 year old female with history of diabetes mellitus, breast cancer, HTN, fibromyalgia, IBS, CAD, hypothyroidism, and prolonged QT admitted with symptomatic anemia-hemoglobin 7.2 -no overt melena or hematochezia.  SBE 04/06/2024-gastritis, hiatal hernia without definite Cameron's erosions, no active bleeding   Interval History   -- Clinically stable overnight without overt bleeding -- Hemoglobin today stable at 8.9 -- Received iron sucrose 03/09/2024    Objective   Vital signs in last 24 hours: Temp:  [97.7 F (36.5 C)-98.4 F (36.9 C)] 98.2 F (36.8 C) (06/04 1432) Pulse Rate:  [88-100] 100 (06/04 1432) Resp:  [14-18] 16 (06/04 1432) BP: (106-127)/(50-54) 117/54 (06/04 1432) SpO2:  [91 %-98 %] 98 % (06/04 1432) Last BM Date : 03/09/24 General:    Elderly woman resting quietly in bed no distress Heart:  Regular rate and rhythm; no murmurs Lungs: Respirations even and unlabored, lungs CTA bilaterally Abdomen:  Soft, nontender and nondistended. Normal bowel sounds. Extremities:  Without edema. Neurologic:  Alert and oriented,  grossly normal neurologically. Psych:  Cooperative. Normal mood and affect.  Intake/Output from previous day: No intake/output data recorded. Intake/Output this shift: Total I/O In: 240 [P.O.:240] Out: -   Lab Results: Recent Labs    03/08/24 2030 03/09/24 0152 03/09/24 1027 03/10/24 0341 03/10/24 1657 03/11/24 0316  WBC 9.4 9.2  --   --   --  8.5  HGB 7.2* 7.1*   < > 9.0* 9.0* 8.9*  HCT 26.2* 25.1*   < > 31.3* 31.2* 31.5*  PLT 353 305  --   --   --  332   < > = values in this interval not displayed.   BMET Recent Labs    03/09/24 0152 03/10/24 0341 03/11/24 0316  NA 136 137 139  K 3.4* 3.5 3.3*  CL 101 102 100  CO2 25 25 27   GLUCOSE 58* 115* 140*  BUN 12 6* 6*  CREATININE 0.72 0.73 0.73  CALCIUM  7.5* 8.0*  8.6*   LFT Recent Labs    03/08/24 2030  PROT 5.5*  ALBUMIN  2.7*  AST 23  ALT 14  ALKPHOS 41  BILITOT 0.9   PT/INR No results for input(s): "LABPROT", "INR" in the last 72 hours.  Studies/Results: CTA 03/09/2024 1. No evidence of active GI bleeding. 2. Large hiatal hernia.  Endoscopic Studies: EGD 03/08/2024 - Tortuous esophagus. - Erythematous mucosa in the antrum. Biopsied. - Gastritis. Biopsied. No definite Cameron's erosions. Gastritis was present in the hiatal hernia sac. This is a potential source of recent bleeding that could explain the patient's anemia. - Normal gastric body and cardia. - Multiple gastric polyps. - Medium-sized hiatal hernia. - Normal examined duodenum. - The examined portion of the jejunum was normal.   Clinical Impression   83 year old female with history of diabetes mellitus, breast cancer, HTN, fibromyalgia, IBS, CAD, hypothyroidism, and prolonged QT admitted with symptomatic anemia-hemoglobin 7.2 -no overt melena or hematochezia.  Small bowel enteroscopy 03/10/2024 showed evidence of gastritis in the hiatal hernia sac but no definite Cameron's erosions.  This is potentially a source of bleeding.  Patient was overall clinically stable today.  Denies overt melena or or hematochezia.  Hemoglobin remains at 8.9.  Received iron sucrose 03/09/2024.   Plan  Patient overall stable for discharge today or tomorrow from our perspective Can continue to monitor H&H Continue pantoprazole  40 mg p.o. twice daily Continue vitamin B12 Will  coordinate follow-up with GI office in 4 to 8 weeks after hospital discharge.    LOS: 3 days   Truddie Furrow  03/11/2024, 4:06 PM  Eugenia Hess, MD [facility]

## 2024-03-11 NOTE — Evaluation (Signed)
 Physical Therapy Evaluation Patient Details Name: Jeanne Kim MRN: [id number] DOB: 1940-11-07 Today's Date: 03/11/2024  History of Present Illness  83 y.o. female presents to [facility] hospital on 03/08/2024 with dizziness, found to be anemic. Pt underwent EGD on 03/10/2024 with findings of gastritis. PMH includes anxiety, depression, fibromyalgia, hypothyroidism, DMII, GERD.  Clinical Impression  Pt presents to PT with deficits in functional mobility, gait, balance, cognition, endurance. Pt reports lightheadedness initially with standing and first bout of ambulation, orthostatic vitals documented below. Pt is able to ambulate for short household distances with good stability when utilizing RW. PT provides education on hand placement during transfers and reinforces the need for RW use at all times when standing/ambulating to reduce falls risk. PT recommends a return to [facility] with HHPT.     03/11/24 1002  Orthostatic Lying   BP- Lying 116/59  Pulse- Lying 90  Orthostatic Sitting  BP- Sitting 130/70  Pulse- Sitting 100  Orthostatic Standing at 0 minutes  BP- Standing at 0 minutes 132/67  Pulse- Standing at 0 minutes 106  Orthostatic Standing at 3 minutes  BP- Standing at 3 minutes 102/78  Pulse- Standing at 3 minutes 106        If plan is discharge home, recommend the following: A little help with bathing/dressing/bathroom;Assistance with cooking/housework;Assist for transportation;Help with stairs or ramp for entrance;Direct supervision/assist for financial management;Direct supervision/assist for medications management   Can travel by private vehicle        Equipment Recommendations None recommended by PT  Recommendations for Other Services       Functional Status Assessment Patient has had a recent decline in their functional status and demonstrates the ability to make significant improvements in function in a reasonable and predictable amount of time.     Precautions /  Restrictions Precautions Precautions: Fall Recall of Precautions/Restrictions: Impaired Restrictions Weight Bearing Restrictions Per Provider Order: No      Mobility  Bed Mobility Overal bed mobility: Needs Assistance Bed Mobility: Supine to Sit     Supine to sit: Supervision, HOB elevated          Transfers Overall transfer level: Needs assistance Equipment used: Rolling walker (2 wheels) Transfers: Sit to/from Stand Sit to Stand: Supervision           General transfer comment: increased time and multiple attempts for initial sit to stand. PT provides cues on hand placement for sit to stand and stand to sit at end of session    Ambulation/Gait Ambulation/Gait assistance: Contact guard assist Gait Distance (Feet): 40 Feet (additional trial of 15' prior to 40' bout) Assistive device: Rolling walker (2 wheels) Gait Pattern/deviations: Step-through pattern Gait velocity: reduced Gait velocity interpretation: <1.8 ft/sec, indicate of risk for recurrent falls   General Gait Details: slowed step-through gait  Stairs            Wheelchair Mobility     Tilt Bed    Modified Rankin (Stroke Patients Only)       Balance Overall balance assessment: Needs assistance Sitting-balance support: No upper extremity supported, Feet supported Sitting balance-Leahy Scale: Good     Standing balance support: Single extremity supported, Reliant on assistive device for balance Standing balance-Leahy Scale: Poor                               Pertinent Vitals/Pain Pain Assessment Pain Assessment: No/denies pain    Home Living Family/patient expects to be discharged  to:: Other (Comment)                   Additional Comments: ALF at [facility]. pt initially reports living in [place] with her spouse, then when asked specifically about [facility] she reports that she lives there    Prior Function Prior Level of Function : Independent/Modified  Independent;History of Falls (last six months)             Mobility Comments: pt reports having multiple falls in the last 3 months. typically ambulates with a RW however she reports all of her falls have happened when not utilizing the walker.       Extremity/Trunk Assessment   Upper Extremity Assessment Upper Extremity Assessment: Generalized weakness    Lower Extremity Assessment Lower Extremity Assessment: Generalized weakness    Cervical / Trunk Assessment Cervical / Trunk Assessment: Kyphotic  Communication   Communication Communication: Impaired Factors Affecting Communication: Hearing impaired    Cognition Arousal: Alert Behavior During Therapy: WFL for tasks assessed/performed   PT - Cognitive impairments: Memory, Awareness, Safety/Judgement                         Following commands: Intact       Cueing Cueing Techniques: Verbal cues     General Comments General comments (skin integrity, edema, etc.): orthostatic vitals documented in flowsheet. Pt does report symptoms of lightheadedness in standing and with initial bout of ambulation, denies symptoms during 2nd bout of ambulation    Exercises     Assessment/Plan    PT Assessment Patient needs continued PT services  PT Problem List Decreased strength;Decreased activity tolerance;Decreased balance;Decreased mobility;Decreased knowledge of use of DME;Decreased cognition;Decreased safety awareness;Decreased knowledge of precautions       PT Treatment Interventions DME instruction;Gait training;Functional mobility training;Therapeutic activities;Therapeutic exercise;Balance training;Neuromuscular re-education;Cognitive remediation;Patient/family education    PT Goals (Current goals can be found in the Care Plan section)  Acute Rehab PT Goals Patient Stated Goal: to return home PT Goal Formulation: With patient Time For Goal Achievement: 03/25/24 Potential to Achieve Goals: Good     Frequency Min 2X/week     Co-evaluation               AM-PAC PT "6 Clicks" Mobility  Outcome Measure Help needed turning from your back to your side while in a flat bed without using bedrails?: A Little Help needed moving from lying on your back to sitting on the side of a flat bed without using bedrails?: A Little Help needed moving to and from a bed to a chair (including a wheelchair)?: A Little Help needed standing up from a chair using your arms (e.g., wheelchair or bedside chair)?: A Little Help needed to walk in hospital room?: A Little Help needed climbing 3-5 steps with a railing? : A Little 6 Click Score: 18    End of Session Equipment Utilized During Treatment: Gait belt Activity Tolerance: Patient tolerated treatment well Patient left: in chair;with call bell/phone within reach;with chair alarm set Nurse Communication: Mobility status PT Visit Diagnosis: Other abnormalities of gait and mobility (R26.89);Muscle weakness (generalized) (M62.81)    Time: 3016-0109 PT Time Calculation (min) (ACUTE ONLY): 26 min   Charges:   PT Evaluation $PT Eval Low Complexity: 1 Low   PT General Charges $$ ACUTE PT VISIT: 1 Visit         Rexie Catena, PT, DPT Acute Rehabilitation Office [phone]   Rexie Catena 03/11/2024,  12:38 PM

## 2024-03-12 ENCOUNTER — Other Ambulatory Visit (HOSPITAL_COMMUNITY): Payer: Self-pay

## 2024-03-12 DIAGNOSIS — D500 Iron deficiency anemia secondary to blood loss (chronic): Secondary | ICD-10-CM | POA: Diagnosis not present

## 2024-03-12 DIAGNOSIS — K922 Gastrointestinal hemorrhage, unspecified: Secondary | ICD-10-CM | POA: Diagnosis not present

## 2024-03-12 LAB — BASIC METABOLIC PANEL WITH GFR
Anion gap: 7 (ref 5–15)
BUN: 8 mg/dL (ref 8–23)
CO2: 26 mmol/L (ref 22–32)
Calcium: 8.6 mg/dL — ABNORMAL LOW (ref 8.9–10.3)
Chloride: 102 mmol/L (ref 98–111)
Creatinine, Ser: 0.82 mg/dL (ref 0.44–1.00)
GFR, Estimated: >60 mL/min (ref >60)
Glucose, Bld: 157 mg/dL — ABNORMAL HIGH (ref 70–99)
Potassium: 3.5 mmol/L (ref 3.5–5.1)
Sodium: 135 mmol/L (ref 135–145)

## 2024-03-12 LAB — HEMOGLOBIN AND HEMATOCRIT, BLOOD
HCT: 30.1 % — ABNORMAL LOW (ref 36.0–46.0)
Hemoglobin: 8.6 g/dL — ABNORMAL LOW (ref 12.0–15.0)

## 2024-03-12 LAB — SURGICAL PATHOLOGY

## 2024-03-12 MED ORDER — FERROUS SULFATE 325 (65 FE) MG PO TABS
325.0000 mg | ORAL_TABLET | Freq: Every day | ORAL | 3 refills | Status: DC
  Filled 2024-03-12: qty 30, 30d supply, fill #0

## 2024-03-12 MED ORDER — POLYETHYLENE GLYCOL 3350 17 G PO PACK: 17.0000 g | PACK | Freq: Every day | ORAL | Status: DC

## 2024-03-12 MED ORDER — POLYETHYLENE GLYCOL 3350 17 GM/SCOOP PO POWD
17.0000 g | Freq: Every day | ORAL | 0 refills | Status: AC
  Filled 2024-03-12: qty 238, 14d supply, fill #0

## 2024-03-12 MED ORDER — LEVOTHYROXINE SODIUM 125 MCG PO TABS
125.0000 ug | ORAL_TABLET | Freq: Every day | ORAL | 0 refills | Status: AC
  Filled 2024-03-12: qty 30, 30d supply, fill #0

## 2024-03-12 NOTE — TOC Transition Note (Signed)
 Transition of Care [facility]) - Discharge Note   Patient Details  Name: Jeanne Kim MRN: [id number] Date of Birth: [birth year]  [facility] CM/SW Contact:  Jeanne Hals, LCSW Phone Number: 03/12/2024, 10:54 AM   Clinical Narrative:   [facility].  RN call report to [phone].  [facility] [facility] transport will be at [employer] [facility]	[place] entrance at [employer] and will call RN station just prior to arriving.  Pt will need to be brought down with assistance getting into the vehicle.   Final next level of care: Assisted Living Barriers to Discharge: Barriers Resolved   Patient Goals and CMS Choice Patient states their goals for this hospitalization and ongoing recovery are:: back to normal   Choice offered to / list presented to : Patient, Adult Children (pt and daughter confirm plan to return to [facility])      Discharge Placement                Patient to be transferred to facility by: [facility] Name of family member notified: daughter [facility] Patient and family notified of of transfer: 03/12/24  Discharge Plan and Services Additional resources added to the After Visit Summary for   In-house Referral: Clinical Social Work   Post Acute Care Choice: Resumption of Svcs/PTA Provider ([facility])                               Social Drivers of Health (SDOH) Interventions SDOH Screenings   Food Insecurity: Patient Declined (03/09/2024)  Housing: Low Risk  (03/09/2024)  Transportation Needs: Patient Unable To Answer (03/10/2024)  Utilities: Patient Unable To Answer (03/10/2024)  Financial Resource Strain: Low Risk  (10/19/2022)   Received from [facility], [facility]  Physical Activity: Insufficiently Active (02/04/2020)   Received from [facility] visits prior to 12/08/2022., [facility] visits prior to 12/08/2022.  Social Connections: Unknown (03/10/2024)  Stress: No Stress Concern  Present (02/04/2020)   Received from [facility] visits prior to 12/08/2022., [facility] visits prior to 12/08/2022.  Tobacco Use: Low Risk  (03/09/2024)     Readmission Risk Interventions    09/13/2022    1:16 PM 05/16/2022   11:25 AM  Readmission Risk Prevention Plan  Transportation Screening Complete Complete  HRI or Home Care Consult Complete   Social Work Consult for Recovery Care Planning/Counseling Complete   Palliative Care Screening Not Applicable   Medication Review Ocean[employer]) Complete Complete  HRI or Home Care Consult  Complete  SW Recovery Care/Counseling Consult  Complete  Palliative Care Screening  Not Applicable  Skilled Nursing Facility  Not Applicable

## 2024-03-12 NOTE — Plan of Care (Signed)

## 2024-03-12 NOTE — Discharge Summary (Signed)
 Physician Discharge Summary  SNOW PEOPLES WUJ:[id number] DOB: [birth year] DOA: 03/08/2024  PCP: Veda Gerald, MD  Admit date: 03/08/2024 Discharge date: 03/12/2024  Admitted From: Home Disposition:  Home  Recommendations for Outpatient Follow-up:  Follow up with PCP in 1-2 weeks Please obtain BMP/CBC in one week   Home Health:Yes Equipment/Devices:None  Discharge Condition:Stable CODE STATUS:Full Diet recommendation: Heart Healthy   Brief/Interim Summary: 83 y.o. female past medical history of anxiety, depression, fibromyalgia hypothyroidism diabetes mellitus type 2 GERD with gastric stricture, history of gastric ulcer macrocytic anemia comes in from assisted living facility for dizziness, on admission her hemoglobin was 7.1   Discharge Diagnoses:  Principal Problem:   Iron deficiency anemia due to chronic blood loss Active Problems:   Symptomatic anemia   GI bleed   Vitamin B12 deficiency  Acute blood loss anemia secondary to iron deficiency anemia due to chronic GI loss/acute GI bleed: Ferritin was 4 she status post 1 unit of packed red blood cells and IV iron. GI was consulted and EGD was done on 03/10/2024 that showed gastritis. GI recommended to continue PPI. PT OT evaluated the patient recommended home health PT.  Vitamin B12 deficiency: She was started on B12 subcutaneously for 3 days.  Hypovolemic hypotension: Resolved with IV fluids.  Diabetes mellitus type 2: With an A1c of 6.8, no changes made to her medication.  Hypothyroidism: TSH was mildly low her Synthroid  dose was decreased she will need a follow-up of TSH and free T4 in 6 weeks.  Anxiety/depression: No changes made to her medication.  Essential hypertension: She is an octogenarian, please inhibitor was discontinued as her blood pressure remained 140s to 130s. It is not clear why she was on midodrine  this was discontinued.   Discharge Instructions  Discharge Instructions     Diet - low sodium  heart healthy   Complete by: As directed    Increase activity slowly   Complete by: As directed       Allergies as of 03/12/2024       Reactions   Codeine Hives   Sulfonamide Derivatives Hives   Canagliflozin    Penicillins Hives   Pt states she does NOT have allergy to penicillin   Sulfa Antibiotics Other (See Comments)   unknown        Medication List     STOP taking these medications    midodrine  2.5 MG tablet Commonly known as: PROAMATINE    ramipril  5 MG capsule Commonly known as: ALTACE        TAKE these medications    amLODipine  2.5 MG tablet Commonly known as: NORVASC  Take 2.5 mg by mouth daily.   Aspirin  Low Dose 81 MG tablet Generic drug: aspirin  EC Take 81 mg by mouth every morning.   atorvastatin  20 MG tablet Commonly known as: LIPITOR Take 20 mg by mouth every evening.   doxepin  10 MG capsule Commonly known as: SINEQUAN  Take 10 mg by mouth at bedtime.   DULoxetine  60 MG capsule Commonly known as: CYMBALTA  Take 60 mg by mouth daily.   EPINEPHrine  0.3 mg/0.3 mL Soaj injection Commonly known as: EPI-PEN Inject 0.3 mg into the muscle daily as needed for anaphylaxis.   ferrous sulfate  325 (65 FE) MG tablet Take 1 tablet (325 mg total) by mouth daily with breakfast.   furosemide  20 MG tablet Commonly known as: LASIX  Take 1 tablet (20 mg total) by mouth daily.   Jardiance  25 MG Tabs tablet Generic drug: empagliflozin  Take 1 tablet (25 mg total)  by mouth daily.   levothyroxine  125 MCG tablet Commonly known as: SYNTHROID  Take 1 tablet (125 mcg total) by mouth daily at 6 (six) AM. Start taking on: March 13, 2024 What changed:  medication strength how much to take Another medication with the same name was removed. Continue taking this medication, and follow the directions you see here.   metFORMIN 1000 MG tablet Commonly known as: GLUCOPHAGE Take 1,000 mg by mouth 2 (two) times daily.   metoprolol  succinate 25 MG 24 hr tablet Commonly  known as: TOPROL -XL Take 25 mg by mouth daily.   pantoprazole  40 MG tablet Commonly known as: PROTONIX  Take 40 mg by mouth daily.   pioglitazone 45 MG tablet Commonly known as: ACTOS Take 45 mg by mouth daily.   polyethylene glycol 17 g packet Commonly known as: MIRALAX  / GLYCOLAX  Take 17 g by mouth daily.   potassium chloride  SA 20 MEQ tablet Commonly known as: KLOR-CON  M Take 1 tablet (20 mEq total) by mouth daily.   Soliqua  100-33 UNT-MCG/ML Sopn Generic drug: Insulin  Glargine-Lixisenatide  Inject 20 Units into the skin daily. Sliding Scale        Allergies  Allergen Reactions   Codeine Hives   Sulfonamide Derivatives Hives   Canagliflozin    Penicillins Hives    Pt states she does NOT have allergy to penicillin   Sulfa Antibiotics Other (See Comments)    unknown    Consultations: Enterology   Procedures/Studies: CT ANGIO GI BLEED Result Date: 03/09/2024 EXAM: CTA ABDOMEN AND PELVIS WITH CONTRAST 03/09/2024 02:13:00 AM TECHNIQUE: CTA images of the abdomen and pelvis with intravenous contrast. Three-dimensional MIP/volume rendered formations were performed. Automated exposure control, iterative reconstruction, and/or weight based adjustment of the mA/kV was utilized to reduce the radiation dose to as low as reasonably achievable. COMPARISON: CT abdomen/pelvis dated 09/13/2022. CLINICAL HISTORY: GI bleed, symptomatic anemia, hypotension. Chief complaints; Dizziness; CT ANGIO GI BLEED; GI bleed, symptomatic anemia, hypotension. FINDINGS: VASCULATURE: GI BLEED: No active contrast extravasation or spillage into the bowel lumen to suggest active GI bleeding. AORTA: No acute finding. No abdominal aortic aneurysm. No dissection. Atherosclerotic calcifications of the abdominal aorta and branch vessels. CELIAC TRUNK: No acute finding. No occlusion or significant stenosis. SUPERIOR MESEMTRIC ARTERY: No acute finding. No occlusion or significant stenosis. INFERIOR MESENTERIC ARTERY:  No acute finding. No occlusion or significant stenosis. RENAL ARTERIES: No acute finding. No occlusion or significant stenosis. ILIAC ARTERIES: No acute finding. No occlusion or significant stenosis. ABDOMEN/PELVIS: LOWER CHEST: Mild subpleural scarring at the lung bases. LIVER: The liver is unremarkable. GALLBLADDER AND BILE DUCTS: Gallbladder is unremarkable. No biliary ductal dilatation. SPLEEN: The spleen is unremarkable. PANCREAS: The pancreas is unremarkable. ADRENAL GLANDS: Bilateral adrenal glands demonstrate no acute abnormality. KIDNEYS, URETERS AND BLADDER: No stones in the kidneys or ureters. No hydronephrosis. No perinephric or periureteral stranding. Moderately descended bladder. GI AND BOWEL: Large hiatal hernia. Stomach and duodenal sweep demonstrate no acute abnormality. There is no bowel obstruction. No abnormal bowel wall thickening or distension. No acute appendicitis. Normal appendix (image 137). No colonic wall thickening or mass is evident on CT. REPRODUCTIVE: Status post hysterectomy. PERITONEUM AND RETROPERITONEUM: No ascites or free air. LYMPH NODES: No lymphadenopathy. BONES AND SOFT TISSUES: Mild degenerative changes of the visualized thoracolumbar spine. Left hip arthroplasty with streak artifact across the pelvis. No acute soft tissue abnormality. IMPRESSION: 1. No evidence of active GI bleeding. 2. Large hiatal hernia. Electronically signed by: Zadie Herter MD 03/09/2024 02:30 AM EDT RP Workstation: [id number]  DG Chest Portable 1 View Result Date: 03/08/2024 CLINICAL DATA:  Weakness EXAM: PORTABLE CHEST 1 VIEW COMPARISON:  09/13/2022 FINDINGS: Shallow inspiration. Mild cardiac enlargement. No airspace disease or consolidation in the lungs. Probable linear atelectasis in the lung bases. No pleural effusion or pneumothorax. Mediastinal contours appear intact. Degenerative changes in the thoracic spine and shoulders. Postoperative changes in the cervical spine. IMPRESSION: Shallow  inspiration.  Mild cardiac enlargement.  Lungs are clear. Electronically Signed   By: Boyce Byes M.D.   On: 03/08/2024 20:52   (Echo, Carotid, EGD, Colonoscopy, ERCP)    Subjective: No complaints tolerating her diet  Discharge Exam: Vitals:   03/12/24 0532 03/12/24 0742  BP: (!) 114/59 (!) 140/77  Pulse: 96 98  Resp: 17 16  Temp: 98 F (36.7 C) 97.6 F (36.4 C)  SpO2: 100% 98%   Vitals:   03/11/24 1432 03/11/24 1952 03/12/24 0532 03/12/24 0742  BP: (!) 117/54 124/70 (!) 114/59 (!) 140/77  Pulse: 100 97 96 98  Resp: 16 18 17 16   Temp: 98.2 F (36.8 C) 98.2 F (36.8 C) 98 F (36.7 C) 97.6 F (36.4 C)  TempSrc:  Oral Oral   SpO2: 98% 99% 100% 98%  Weight:      Height:        General: Pt is alert, awake, not in acute distress Cardiovascular: RRR, S1/S2 +, no rubs, no gallops Respiratory: CTA bilaterally, no wheezing, no rhonchi Abdominal: Soft, NT, ND, bowel sounds + Extremities: no edema, no cyanosis    The results of significant diagnostics from this hospitalization (including imaging, microbiology, ancillary and laboratory) are listed below for reference.     Microbiology: Recent Results (from the past 240 hours)  MRSA Next Gen by PCR, Nasal     Status: None   Collection Time: 03/10/24  6:10 AM   Specimen: Nasal Mucosa; Nasal Swab  Result Value Ref Range Status   MRSA by PCR Next Gen NOT DETECTED NOT DETECTED Final    Comment: (NOTE) The GeneXpert MRSA Assay (FDA approved for NASAL specimens only), is one component of a comprehensive MRSA colonization surveillance program. It is not intended to diagnose MRSA infection nor to guide or monitor treatment for MRSA infections. Test performance is not FDA approved in patients less than 66 years old. Performed at [facility], [address] [place], [place] [address]      Labs: BNP (last 3 results) No results for input(s): "BNP" in the last 8760 hours. Basic Metabolic Panel: Recent Labs  Lab  03/08/24 2030 03/09/24 0152 03/10/24 0341 03/11/24 0316 03/12/24 0259  NA 135 136 137 139 135  K 3.6 3.4* 3.5 3.3* 3.5  CL 101 101 102 100 102  CO2 23 25 25 27 26   GLUCOSE 59* 58* 115* 140* 157*  BUN 13 12 6* 6* 8  CREATININE 0.82 0.72 0.73 0.73 0.82  CALCIUM  7.6* 7.5* 8.0* 8.6* 8.6*  MG  --   --  1.2*  --   --    Liver Function Tests: Recent Labs  Lab 03/08/24 2030  AST 23  ALT 14  ALKPHOS 41  BILITOT 0.9  PROT 5.5*  ALBUMIN  2.7*   Recent Labs  Lab 03/08/24 2030  LIPASE 24   No results for input(s): "AMMONIA" in the last 168 hours. CBC: Recent Labs  Lab 03/08/24 2030 03/09/24 0152 03/09/24 1027 03/10/24 0341 03/10/24 1657 03/11/24 0316 03/11/24 1654 03/12/24 0259  WBC 9.4 9.2  --   --   --  8.5  --   --   NEUTROABS 5.0  --   --   --   --   --   --   --   HGB 7.2* 7.1*   < > 9.0* 9.0* 8.9* 10.8* 8.6*  HCT 26.2* 25.1*   < > 31.3* 31.2* 31.5* 37.7 30.1*  MCV 72.8* 72.3*  --   --   --  74.3*  --   --   PLT 353 305  --   --   --  332  --   --    < > = values in this interval not displayed.   Cardiac Enzymes: No results for input(s): "CKTOTAL", "CKMB", "CKMBINDEX", "TROPONINI" in the last 168 hours. BNP: Invalid input(s): "POCBNP" CBG: Recent Labs  Lab 03/09/24 0426  GLUCAP 70   D-Dimer No results for input(s): "DDIMER" in the last 72 hours. Hgb A1c No results for input(s): "HGBA1C" in the last 72 hours. Lipid Profile No results for input(s): "CHOL", "HDL", "LDLCALC", "TRIG", "CHOLHDL", "LDLDIRECT" in the last 72 hours. Thyroid  function studies No results for input(s): "TSH", "T4TOTAL", "T3FREE", "THYROIDAB" in the last 72 hours.  Invalid input(s): "FREET3" Anemia work up No results for input(s): "VITAMINB12", "FOLATE", "FERRITIN", "TIBC", "IRON", "RETICCTPCT" in the last 72 hours. Urinalysis    Component Value Date/Time   COLORURINE YELLOW 03/08/2024 2207   APPEARANCEUR CLEAR 03/08/2024 2207   LABSPEC 1.012 03/08/2024 2207   PHURINE 6.0  03/08/2024 2207   GLUCOSEU >=500 (A) 03/08/2024 2207   HGBUR NEGATIVE 03/08/2024 2207   BILIRUBINUR NEGATIVE 03/08/2024 2207   KETONESUR NEGATIVE 03/08/2024 2207   PROTEINUR NEGATIVE 03/08/2024 2207   NITRITE NEGATIVE 03/08/2024 2207   LEUKOCYTESUR TRACE (A) 03/08/2024 2207   Sepsis Labs Recent Labs  Lab 03/08/24 2030 03/09/24 0152 03/11/24 0316  WBC 9.4 9.2 8.5   Microbiology Recent Results (from the past 240 hours)  MRSA Next Gen by PCR, Nasal     Status: None   Collection Time: 03/10/24  6:10 AM   Specimen: Nasal Mucosa; Nasal Swab  Result Value Ref Range Status   MRSA by PCR Next Gen NOT DETECTED NOT DETECTED Final    Comment: (NOTE) The GeneXpert MRSA Assay (FDA approved for NASAL specimens only), is one component of a comprehensive MRSA colonization surveillance program. It is not intended to diagnose MRSA infection nor to guide or monitor treatment for MRSA infections. Test performance is not FDA approved in patients less than 81 years old. Performed at [facility], [address] [place], [place] [address]      Time coordinating discharge: Over 35 minutes  SIGNED:   Macdonald Savoy, MD  [facility] 03/12/2024, 8:00 AM Pager   If 7PM-7AM, please contact night-coverage [email or web address] Password [email or web address]

## 2024-03-12 NOTE — TOC Progression Note (Addendum)
 Transition of Care [facility]) - Progression Note    Patient Details  Name: Jeanne Kim MRN: [id number] Date of Birth: 05/24/1941  Transition of [facility]) CM/SW Contact  Elspeth Hals, LCSW Phone Number: 03/12/2024, 8:24 AM  Clinical Narrative:   DC summary and FL2 securely emailed to [name].    1045: CSW spoke with pt daughter [name], who works at [employer].  She can send facility transport to pick her up at 1pm.    Expected Discharge Plan: Assisted Living [facility]) Barriers to Discharge: Continued Medical Work up  Expected Discharge Plan and Services In-house Referral: Clinical Social Work   Post Acute Care Choice: Resumption of Svcs/PTA Provider [facility]) Living arrangements for the past 2 months: Assisted Living Facility Expected Discharge Date: 03/12/24                                     Social Determinants of Health (SDOH) Interventions SDOH Screenings   Food Insecurity: Patient Declined (03/09/2024)  Housing: Low Risk  (03/09/2024)  Transportation Needs: Patient Unable To Answer (03/10/2024)  Utilities: Patient Unable To Answer (03/10/2024)  Financial Resource Strain: Low Risk  (10/19/2022)   Received from [facility], [facility]  Physical Activity: Insufficiently Active (02/04/2020)   Received from [facility] visits prior to 12/08/2022., [facility] visits prior to 12/08/2022.  Social Connections: Unknown (03/10/2024)  Stress: No Stress Concern Present (02/04/2020)   Received from [facility] visits prior to 12/08/2022., [facility] visits prior to 12/08/2022.  Tobacco Use: Low Risk  (03/09/2024)    Readmission Risk Interventions    09/13/2022    1:16 PM 05/16/2022   11:25 AM  Readmission Risk Prevention Plan  Transportation Screening Complete Complete  HRI or Home Care Consult Complete   Social Work Consult for Recovery Care Planning/Counseling  Complete   Palliative Care Screening Not Applicable   Medication Review [employer]) Complete Complete  HRI or Home Care Consult  Complete  SW Recovery Care/Counseling Consult  Complete  Palliative Care Screening  Not Applicable  Skilled Nursing Facility  Not Applicable

## 2024-03-13 ENCOUNTER — Ambulatory Visit: Payer: Self-pay | Admitting: Pediatrics

## 2024-03-13 LAB — BPAM RBC
Blood Product Expiration Date: 202506112359
ISSUE DATE / TIME: 202506020253
Unit Type and Rh: 202506132359
Unit Type and Rh: 9500
Unit Type and Rh: 9500

## 2024-03-13 LAB — TYPE AND SCREEN
ABO/RH(D): O NEG
Antibody Screen: NEGATIVE
Unit division: 0
Unit division: 0

## 2024-04-20 ENCOUNTER — Ambulatory Visit: Admitting: Gastroenterology

## 2024-04-20 NOTE — Progress Notes (Deleted)
 Jeanne Kim [id number] [birth year]   Chief Complaint: Anemia  Referring Provider: Orpha Yancey LABOR, MD Primary GI MD: Dr. Shila  HPI: Jeanne Kim is a 83 y.o. female with past medical history of anxiety/depression, fibromyalgia, hypothyroidism, T2DM, GERD with gastric stricture, history of gastric ulcer, macrocytic anemia who presents today for hospital follow-up.    Patient admitted 03/08/2024 to 03/12/2024 after presenting from assisted living facility for dizziness.  On admission hemoglobin was 7.1.  Ferritin was 4, she received 1 unit PRBCs and IV iron .  GI was consulted and EGD done 03/10/2024 showed gastritis.  Recommended to continue PPI.  Diagnosed with acute blood loss anemia secondary to iron  deficiency anemia due to chronic GI loss/acute GI bleed.     Previous GI Procedures/Imaging   Small bowel enteroscopy 03/10/2024 - Tortuous esophagus.  - Erythematous mucosa in the antrum. Biopsied.  - Gastritis. Biopsied. No definite Ole' s erosions. Gastritis was present in the hiatal hernia sac. This is a potential source of recent bleeding that could explain the patient' s anemia.  - Normal gastric body and cardia.  - Multiple gastric polyps.  - Medium- sized hiatal hernia.  - Normal examined duodenum.  - The examined portion of the jejunum was normal.  EGD 12/21/2020 (Dr. Eda) - Tortuous esophagus.  - Medium- sized hiatal hernia.  - Multiple gastric polyps. Biopsied.  - A few gastric erosions. Biopsied.  - No evidence for recent or active bleeding.  Colonoscopy 12/23/2020 (Dr. Eda) - Hemorrhoids found on perianal exam.  - Non- bleeding internal hemorrhoids.  - Diverticulosis in the sigmoid colon.  - Distal sigmoid ulceration. Suspicious for ischemic colitis. Biopsied.  - The examination was otherwise normal on direct and retroflexion views.  Past Medical History:  Diagnosis Date   Allergy    Anemia    Anginal pain (HCC)    Anxiety    Arthritis    Blood  transfusion without reported diagnosis    Breast cancer (HCC)    Carotid artery plaque    bilat   Cataract    Colon polyps    adenomatous   Concussion 02/2016   Driving restrictions for 6w   Coronary heart disease    Depression    Diverticulosis    Dizziness    Esophageal reflux    Esophageal stricture    Fibromyalgia    Gallstones    Hiatal hernia    Hypercholesterolemia    Hypertension    Hypothyroidism    IBS (irritable bowel syndrome)    Kidney stones    Osteoporosis    Pneumonia    recently, just finished ABO   PONV (postoperative nausea and vomiting)    Sleep apnea    CPAP   Thyroid  disease 2002   Type II or unspecified type diabetes mellitus without mention of complication, not stated as uncontrolled    Unspecified gastritis and gastroduodenitis without mention of hemorrhage     Past Surgical History:  Procedure Laterality Date   ANAL RECTAL MANOMETRY N/A 08/05/2015   Procedure: ANO RECTAL MANOMETRY;  Surgeon: Gustav Shila GAILS, MD;  Location: [facility];  Service: Endoscopy;  Laterality: N/A;   APPENDECTOMY     BIOPSY  08/06/2020   Procedure: BIOPSY;  Surgeon: Cindie Carlin POUR, DO;  Location: [facility];  Service: Endoscopy;;   BIOPSY  12/21/2020   Procedure: BIOPSY;  Surgeon: Eda Iha, MD;  Location: [facility];  Service: Gastroenterology;;   BIOPSY  12/23/2020   Procedure: BIOPSY;  Surgeon:  Eda Iha, MD;  Location: [facility];  Service: Gastroenterology;;   BREAST LUMPECTOMY     left   CARDIAC CATHETERIZATION     CATARACT EXTRACTION W/PHACO Left 03/30/2013   Procedure: CATARACT EXTRACTION PHACO AND INTRAOCULAR LENS PLACEMENT (IOC);  Surgeon: Cherene Mania, MD;  Location: [facility];  Service: Ophthalmology;  Laterality: Left;  CDE:19.28   CATARACT EXTRACTION W/PHACO Right 04/09/2013   Procedure: CATARACT EXTRACTION PHACO AND INTRAOCULAR LENS PLACEMENT (IOC);  Surgeon: Cherene Mania, MD;  Location: [facility];  Service: Ophthalmology;  Laterality:  Right;  CDE: 15.44   CERVICAL FUSION     CESAREAN SECTION     CHOLECYSTECTOMY     COLONOSCOPY WITH PROPOFOL  N/A 12/23/2020   Procedure: COLONOSCOPY WITH PROPOFOL ;  Surgeon: Eda Iha, MD;  Location: [facility];  Service: Gastroenterology;  Laterality: N/A;   CYSTOCELE REPAIR     DILATION AND CURETTAGE OF UTERUS     ENTEROSCOPY N/A 03/10/2024   Procedure: ENTEROSCOPY;  Surgeon: Suzann Inocente HERO, MD;  Location: [facility];  Service: Gastroenterology;  Laterality: N/A;   ESOPHAGOGASTRODUODENOSCOPY (EGD) WITH PROPOFOL  N/A 08/06/2020   Procedure: ESOPHAGOGASTRODUODENOSCOPY (EGD) WITH PROPOFOL ;  Surgeon: Cindie Carlin POUR, DO;  Location: [facility];  Service: Endoscopy;  Laterality: N/A;   ESOPHAGOGASTRODUODENOSCOPY (EGD) WITH PROPOFOL  N/A 12/21/2020   Procedure: ESOPHAGOGASTRODUODENOSCOPY (EGD) WITH PROPOFOL ;  Surgeon: Eda Iha, MD;  Location: [facility];  Service: Gastroenterology;  Laterality: N/A;   EYE SURGERY Left 03-30-13   Cataract   EYE SURGERY Right 04-09-13   Cataract   RECTOCELE REPAIR     THYROIDECTOMY     TONSILLECTOMY     TOTAL ABDOMINAL HYSTERECTOMY      Current Outpatient Medications  Medication Sig Dispense Refill   amLODipine  (NORVASC ) 2.5 MG tablet Take 2.5 mg by mouth daily.     ASPIRIN  LOW DOSE 81 MG tablet Take 81 mg by mouth every morning.     atorvastatin  (LIPITOR) 20 MG tablet Take 20 mg by mouth every evening.     doxepin  (SINEQUAN ) 10 MG capsule Take 10 mg by mouth at bedtime.     DULoxetine  (CYMBALTA ) 60 MG capsule Take 60 mg by mouth daily.     EPINEPHrine  0.3 mg/0.3 mL IJ SOAJ injection Inject 0.3 mg into the muscle daily as needed for anaphylaxis.     ferrous sulfate  325 (65 FE) MG tablet Take 1 tablet (325 mg total) by mouth daily with breakfast. 30 tablet 3   furosemide  (LASIX ) 20 MG tablet Take 1 tablet (20 mg total) by mouth daily. 30 tablet 0   Insulin  Glargine-Lixisenatide  (SOLIQUA ) 100-33 UNT-MCG/ML SOPN Inject 20 Units into the  skin daily. Sliding Scale 15 mL 0   JARDIANCE  25 MG TABS tablet Take 1 tablet (25 mg total) by mouth daily. 30 tablet 0   levothyroxine  (SYNTHROID ) 125 MCG tablet Take 1 tablet (125 mcg total) by mouth daily at 6 (six) AM. 30 tablet 0   metFORMIN (GLUCOPHAGE) 1000 MG tablet Take 1,000 mg by mouth 2 (two) times daily.     metoprolol  succinate (TOPROL -XL) 25 MG 24 hr tablet Take 25 mg by mouth daily.     pantoprazole  (PROTONIX ) 40 MG tablet Take 40 mg by mouth daily.     pioglitazone (ACTOS) 45 MG tablet Take 45 mg by mouth daily.     polyethylene glycol powder (GLYCOLAX /MIRALAX ) 17 GM/SCOOP powder Take 17 g by mouth daily. 238 g 0   potassium chloride  SA (KLOR-CON  M) 20 MEQ tablet Take 1 tablet (20  mEq total) by mouth daily. 120 tablet 0   No current facility-administered medications for this visit.    Allergies as of 04/20/2024 - Review Complete 03/10/2024  Allergen Reaction Noted   Codeine Hives 04/27/2009   Sulfonamide derivatives Hives 04/27/2009   Canagliflozin  12/20/2020   Penicillins Hives 12/20/2020   Sulfa antibiotics Other (See Comments) 05/06/2014    Family History  Problem Relation Age of Onset   Sarcoidosis Mother    Diabetes Mother    Cancer Mother        sarcoma -  Right arm amputation   Hypertension Mother    Lung cancer Father    Diabetes Father    Heart disease Father        Before age 26   Urolithiasis Father    Cancer Father        Lung   Hyperlipidemia Father    Hypertension Father    Esophageal cancer Maternal Uncle    Ovarian cancer Maternal Aunt    Colon cancer Maternal Uncle    Lung cancer Maternal Uncle    Diabetes Maternal Uncle    Diabetes Maternal Aunt    Colon polyps Maternal Uncle    Heart disease Paternal Grandmother     Social History   Tobacco Use   Smoking status: Never   Smokeless tobacco: Never  Vaping Use   Vaping status: Never Used  Substance Use Topics   Alcohol use: No   Drug use: No     Review of Systems:     Constitutional: No weight loss, fever, chills, weakness or fatigue Eyes: No change in vision Ears, Nose, Throat:  No change in hearing or congestion Skin: No rash or itching Cardiovascular: No chest pain, chest pressure or palpitations   Respiratory: No SOB or cough Gastrointestinal: See HPI and otherwise negative Genitourinary: No dysuria or change in urinary frequency Neurological: No headache, dizziness or syncope Musculoskeletal: No new muscle or joint pain Hematologic: No bleeding or bruising    Physical Exam:  Vital signs: There were no vitals taken for this visit.  Constitutional: NAD, Well developed, Well nourished, alert and cooperative Head:  Normocephalic and atraumatic.  Eyes: No scleral icterus. Conjunctiva pink. Mouth: No oral lesions. Respiratory: Respirations even and unlabored. Lungs clear to auscultation bilaterally.  No wheezes, crackles, or rhonchi.  Cardiovascular:  Regular rate and rhythm. No murmurs. No peripheral edema. Gastrointestinal:  Soft, nondistended, nontender. No rebound or guarding. Normal bowel sounds. No appreciable masses or hepatomegaly. Rectal:  Not performed.  Neurologic:  Alert and oriented x4;  grossly normal neurologically.  Skin:   Dry and intact without significant lesions or rashes. Psychiatric: Oriented to person, place and time. Demonstrates good judgement and reason without abnormal affect or behaviors.   RELEVANT LABS AND IMAGING: CBC    Component Value Date/Time   WBC 8.5 03/11/2024 0316   RBC 4.24 03/11/2024 0316   HGB 8.6 (L) 03/12/2024 0259   HGB 8.9 (L) 07/04/2020 1524   HCT 30.1 (L) 03/12/2024 0259   HCT 29.6 (L) 07/04/2020 1524   PLT 332 03/11/2024 0316   PLT 640 (H) 07/04/2020 1524   MCV 74.3 (L) 03/11/2024 0316   MCV 73 (L) 07/04/2020 1524   MCH 21.0 (L) 03/11/2024 0316   MCHC 28.3 (L) 03/11/2024 0316   RDW 19.2 (H) 03/11/2024 0316   RDW 16.3 (H) 07/04/2020 1524   LYMPHSABS 2.6 03/08/2024 2030   MONOABS 1.3  (H) 03/08/2024 2030   EOSABS 0.3 03/08/2024 2030  BASOSABS 0.1 03/08/2024 2030    CMP     Component Value Date/Time   NA 135 03/12/2024 0259   NA 136 07/14/2020 1245   K 3.5 03/12/2024 0259   CL 102 03/12/2024 0259   CO2 26 03/12/2024 0259   GLUCOSE 157 (H) 03/12/2024 0259   BUN 8 03/12/2024 0259   BUN 19 07/14/2020 1245   CREATININE 0.82 03/12/2024 0259   CALCIUM  8.6 (L) 03/12/2024 0259   PROT 5.5 (L) 03/08/2024 2030   ALBUMIN  2.7 (L) 03/08/2024 2030   AST 23 03/08/2024 2030   ALT 14 03/08/2024 2030   ALKPHOS 41 03/08/2024 2030   BILITOT 0.9 03/08/2024 2030   GFRNONAA >60 03/12/2024 0259   GFRAA 72 07/14/2020 1245   Echocardiogram 05/15/2022 1. Left ventricular ejection fraction, by estimation, is 60 to 65% . The left ventricle has normal function. The left ventricle has no regional wall motion abnormalities. There is mild left ventricular hypertrophy. Left ventricular diastolic parameters are consistent with Grade I diastolic dysfunction ( impaired relaxation) .  2. Right ventricular systolic function is normal. The right ventricular size is normal. Tricuspid regurgitation signal is inadequate for assessing PA pressure.  3. Left atrial size was moderately dilated.  4. The mitral valve is abnormal. Mild mitral valve regurgitation. No evidence of mitral stenosis.  5. The tricuspid valve is abnormal.  6. The aortic valve is tricuspid. There is mild calcification of the aortic valve. There is mild thickening of the aortic valve. Aortic valve regurgitation is mild.  7. Aortic dilatation noted. There is mild dilatation of the ascending aorta, measuring 37 mm.  8. The inferior vena cava is normal in size with greater than 50% respiratory variability, suggesting right atrial pressure of 3 mmHg.  Assessment/Plan:    Discuss weaning to once daily PPI   Camie Furbish, PA-C [facility] 04/20/2024, 8:09 AM  Patient Care Team: Orpha Yancey LABOR, MD as PCP - General (Internal  Medicine) Wonda Sharper, MD as PCP - Cardiology (Cardiology) Wonda Sharper, MD as Consulting Physician (Cardiology) Shila Gustav GAILS, MD as Consulting Physician (Gastroenterology) Tish Elsie FALCON, MD as Consulting Physician (Internal Medicine)

## 2024-08-15 ENCOUNTER — Emergency Department (HOSPITAL_COMMUNITY)

## 2024-08-15 ENCOUNTER — Inpatient Hospital Stay (HOSPITAL_COMMUNITY)
Admission: EM | Admit: 2024-08-15 | Discharge: 2024-08-18 | DRG: 378 | Disposition: A | Discharge status: Home Health | Attending: Family Medicine | Admitting: Family Medicine

## 2024-08-15 ENCOUNTER — Encounter (HOSPITAL_COMMUNITY): Payer: Self-pay

## 2024-08-15 ENCOUNTER — Other Ambulatory Visit: Payer: Self-pay

## 2024-08-15 ENCOUNTER — Observation Stay (HOSPITAL_COMMUNITY)

## 2024-08-15 DIAGNOSIS — F39 Unspecified mood [affective] disorder: Secondary | ICD-10-CM | POA: Diagnosis present

## 2024-08-15 DIAGNOSIS — D649 Anemia, unspecified: Secondary | ICD-10-CM | POA: Diagnosis not present

## 2024-08-15 DIAGNOSIS — K219 Gastro-esophageal reflux disease without esophagitis: Secondary | ICD-10-CM | POA: Diagnosis present

## 2024-08-15 DIAGNOSIS — I152 Hypertension secondary to endocrine disorders: Secondary | ICD-10-CM | POA: Diagnosis present

## 2024-08-15 DIAGNOSIS — K254 Chronic or unspecified gastric ulcer with hemorrhage: Secondary | ICD-10-CM | POA: Diagnosis not present

## 2024-08-15 DIAGNOSIS — Z83719 Family history of colon polyps, unspecified: Secondary | ICD-10-CM

## 2024-08-15 DIAGNOSIS — W1830XA Fall on same level, unspecified, initial encounter: Secondary | ICD-10-CM | POA: Diagnosis present

## 2024-08-15 DIAGNOSIS — Z882 Allergy status to sulfonamides status: Secondary | ICD-10-CM

## 2024-08-15 DIAGNOSIS — G4730 Sleep apnea, unspecified: Secondary | ICD-10-CM | POA: Diagnosis present

## 2024-08-15 DIAGNOSIS — Z860100 Personal history of colon polyps, unspecified: Secondary | ICD-10-CM

## 2024-08-15 DIAGNOSIS — K922 Gastrointestinal hemorrhage, unspecified: Principal | ICD-10-CM | POA: Diagnosis present

## 2024-08-15 DIAGNOSIS — F32A Depression, unspecified: Secondary | ICD-10-CM | POA: Diagnosis present

## 2024-08-15 DIAGNOSIS — Z7982 Long term (current) use of aspirin: Secondary | ICD-10-CM

## 2024-08-15 DIAGNOSIS — G4700 Insomnia, unspecified: Secondary | ICD-10-CM | POA: Diagnosis present

## 2024-08-15 DIAGNOSIS — E7800 Pure hypercholesterolemia, unspecified: Secondary | ICD-10-CM | POA: Diagnosis present

## 2024-08-15 DIAGNOSIS — I10 Essential (primary) hypertension: Secondary | ICD-10-CM | POA: Diagnosis present

## 2024-08-15 DIAGNOSIS — Z9079 Acquired absence of other genital organ(s): Secondary | ICD-10-CM

## 2024-08-15 DIAGNOSIS — E890 Postprocedural hypothyroidism: Secondary | ICD-10-CM | POA: Diagnosis present

## 2024-08-15 DIAGNOSIS — E1159 Type 2 diabetes mellitus with other circulatory complications: Secondary | ICD-10-CM | POA: Diagnosis present

## 2024-08-15 DIAGNOSIS — E1165 Type 2 diabetes mellitus with hyperglycemia: Secondary | ICD-10-CM | POA: Diagnosis present

## 2024-08-15 DIAGNOSIS — Z79890 Hormone replacement therapy (postmenopausal): Secondary | ICD-10-CM

## 2024-08-15 DIAGNOSIS — K449 Diaphragmatic hernia without obstruction or gangrene: Secondary | ICD-10-CM | POA: Diagnosis present

## 2024-08-15 DIAGNOSIS — Z853 Personal history of malignant neoplasm of breast: Secondary | ICD-10-CM

## 2024-08-15 DIAGNOSIS — E860 Dehydration: Secondary | ICD-10-CM | POA: Diagnosis present

## 2024-08-15 DIAGNOSIS — D62 Acute posthemorrhagic anemia: Principal | ICD-10-CM | POA: Diagnosis present

## 2024-08-15 DIAGNOSIS — R4189 Other symptoms and signs involving cognitive functions and awareness: Secondary | ICD-10-CM | POA: Diagnosis present

## 2024-08-15 DIAGNOSIS — M797 Fibromyalgia: Secondary | ICD-10-CM | POA: Diagnosis present

## 2024-08-15 DIAGNOSIS — Z794 Long term (current) use of insulin: Secondary | ICD-10-CM

## 2024-08-15 DIAGNOSIS — Z885 Allergy status to narcotic agent status: Secondary | ICD-10-CM

## 2024-08-15 DIAGNOSIS — Z90722 Acquired absence of ovaries, bilateral: Secondary | ICD-10-CM

## 2024-08-15 DIAGNOSIS — Z880 Allergy status to penicillin: Secondary | ICD-10-CM

## 2024-08-15 DIAGNOSIS — Z833 Family history of diabetes mellitus: Secondary | ICD-10-CM

## 2024-08-15 DIAGNOSIS — I2510 Atherosclerotic heart disease of native coronary artery without angina pectoris: Secondary | ICD-10-CM | POA: Diagnosis present

## 2024-08-15 DIAGNOSIS — I959 Hypotension, unspecified: Secondary | ICD-10-CM | POA: Diagnosis present

## 2024-08-15 DIAGNOSIS — Z981 Arthrodesis status: Secondary | ICD-10-CM

## 2024-08-15 DIAGNOSIS — F419 Anxiety disorder, unspecified: Secondary | ICD-10-CM | POA: Diagnosis present

## 2024-08-15 DIAGNOSIS — Z7984 Long term (current) use of oral hypoglycemic drugs: Secondary | ICD-10-CM

## 2024-08-15 DIAGNOSIS — K222 Esophageal obstruction: Secondary | ICD-10-CM | POA: Diagnosis present

## 2024-08-15 DIAGNOSIS — Z8249 Family history of ischemic heart disease and other diseases of the circulatory system: Secondary | ICD-10-CM

## 2024-08-15 DIAGNOSIS — K920 Hematemesis: Secondary | ICD-10-CM

## 2024-08-15 DIAGNOSIS — E039 Hypothyroidism, unspecified: Secondary | ICD-10-CM | POA: Diagnosis present

## 2024-08-15 DIAGNOSIS — E876 Hypokalemia: Secondary | ICD-10-CM | POA: Diagnosis present

## 2024-08-15 DIAGNOSIS — Z79899 Other long term (current) drug therapy: Secondary | ICD-10-CM

## 2024-08-15 DIAGNOSIS — Z90710 Acquired absence of both cervix and uterus: Secondary | ICD-10-CM

## 2024-08-15 DIAGNOSIS — Z91013 Allergy to seafood: Secondary | ICD-10-CM

## 2024-08-15 DIAGNOSIS — Z961 Presence of intraocular lens: Secondary | ICD-10-CM | POA: Diagnosis present

## 2024-08-15 DIAGNOSIS — Z23 Encounter for immunization: Secondary | ICD-10-CM

## 2024-08-15 LAB — CBC WITH DIFFERENTIAL/PLATELET
Abs Immature Granulocytes: 0.13 K/uL — ABNORMAL HIGH (ref 0.00–0.07)
Basophils Absolute: 0.1 K/uL (ref 0.0–0.1)
Basophils Relative: 1 %
Eosinophils Absolute: 0 K/uL (ref 0.0–0.5)
Eosinophils Relative: 0 %
HCT: 23.1 % — ABNORMAL LOW (ref 36.0–46.0)
Hemoglobin: 7 g/dL — ABNORMAL LOW (ref 12.0–15.0)
Immature Granulocytes: 1 %
Lymphocytes Relative: 8 %
Lymphs Abs: 1.2 K/uL (ref 0.7–4.0)
MCH: 28.7 pg (ref 26.0–34.0)
MCHC: 30.3 g/dL (ref 30.0–36.0)
MCV: 94.7 fL (ref 80.0–100.0)
Monocytes Absolute: 1.3 K/uL — ABNORMAL HIGH (ref 0.1–1.0)
Monocytes Relative: 9 %
Neutro Abs: 11.9 K/uL — ABNORMAL HIGH (ref 1.7–7.7)
Neutrophils Relative %: 81 %
Platelets: 378 K/uL (ref 150–400)
RBC: 2.44 MIL/uL — ABNORMAL LOW (ref 3.87–5.11)
RDW: 15.2 % (ref 11.5–15.5)
WBC: 14.6 K/uL — ABNORMAL HIGH (ref 4.0–10.5)
nRBC: 0.1 % (ref 0.0–0.2)

## 2024-08-15 LAB — URINALYSIS, W/ REFLEX TO CULTURE (INFECTION SUSPECTED)
Bacteria, UA: NONE SEEN
Bilirubin Urine: NEGATIVE
Glucose, UA: >=500 mg/dL — AB
Hgb urine dipstick: NEGATIVE
Ketones, ur: 20 mg/dL — AB
Nitrite: NEGATIVE
Protein, ur: NEGATIVE mg/dL
Specific Gravity, Urine: 1.023 (ref 1.005–1.030)
pH: 7 (ref 5.0–8.0)

## 2024-08-15 LAB — CBG MONITORING, ED: Glucose-Capillary: 197 mg/dL — ABNORMAL HIGH (ref 70–99)

## 2024-08-15 LAB — COMPREHENSIVE METABOLIC PANEL WITH GFR
ALT: 12 U/L (ref 0–44)
AST: 18 U/L (ref 15–41)
Albumin: 3.7 g/dL (ref 3.5–5.0)
Alkaline Phosphatase: 56 U/L (ref 38–126)
Anion gap: 16 — ABNORMAL HIGH (ref 5–15)
BUN: 48 mg/dL — ABNORMAL HIGH (ref 8–23)
CO2: 22 mmol/L (ref 22–32)
Calcium: 8.8 mg/dL — ABNORMAL LOW (ref 8.9–10.3)
Chloride: 99 mmol/L (ref 98–111)
Creatinine, Ser: 0.7 mg/dL (ref 0.44–1.00)
GFR, Estimated: >60 mL/min (ref >60)
Glucose, Bld: 205 mg/dL — ABNORMAL HIGH (ref 70–99)
Potassium: 3.8 mmol/L (ref 3.5–5.1)
Sodium: 137 mmol/L (ref 135–145)
Total Bilirubin: 0.9 mg/dL (ref 0.0–1.2)
Total Protein: 6.2 g/dL — ABNORMAL LOW (ref 6.5–8.1)

## 2024-08-15 LAB — TROPONIN T, HIGH SENSITIVITY
Troponin T High Sensitivity: <15 ng/L (ref 0–19)
Troponin T High Sensitivity: <15 ng/L (ref 0–19)

## 2024-08-15 LAB — HEMOGLOBIN AND HEMATOCRIT, BLOOD
HCT: 19.6 % — ABNORMAL LOW (ref 36.0–46.0)
Hemoglobin: 5.9 g/dL — CL (ref 12.0–15.0)

## 2024-08-15 LAB — PROTIME-INR
INR: 1 (ref 0.8–1.2)
Prothrombin Time: 14.2 s (ref 11.4–15.2)

## 2024-08-15 LAB — LACTIC ACID, PLASMA
Lactic Acid, Venous: 3.7 mmol/L (ref 0.5–1.9)
Lactic Acid, Venous: 3 mmol/L (ref 0.5–1.9)

## 2024-08-15 LAB — CK: Total CK: 57 U/L (ref 38–234)

## 2024-08-15 LAB — D-DIMER, QUANTITATIVE: D-Dimer, Quant: 2.1 ug{FEU}/mL — ABNORMAL HIGH (ref 0.00–0.50)

## 2024-08-15 LAB — PREPARE RBC (CROSSMATCH)

## 2024-08-15 LAB — GLUCOSE, CAPILLARY
Glucose-Capillary: 124 mg/dL — ABNORMAL HIGH (ref 70–99)
Glucose-Capillary: 139 mg/dL — ABNORMAL HIGH (ref 70–99)

## 2024-08-15 LAB — POC OCCULT BLOOD, ED: Fecal Occult Bld: POSITIVE — AB

## 2024-08-15 MED ORDER — PANTOPRAZOLE SODIUM 40 MG IV SOLR
40.0000 mg | Freq: Two times a day (BID) | INTRAVENOUS | Status: DC
  Administered 2024-08-16 – 2024-08-18 (×5): 40 mg via INTRAVENOUS
  Filled 2024-08-15 (×5): qty 10

## 2024-08-15 MED ORDER — POLYETHYLENE GLYCOL 3350 17 G PO PACK: 17.0000 g | PACK | Freq: Every day | ORAL | Status: DC | PRN

## 2024-08-15 MED ORDER — SODIUM CHLORIDE 0.9 % IV SOLN: 12.5000 mg | Freq: Three times a day (TID) | INTRAVENOUS | Status: DC | PRN

## 2024-08-15 MED ORDER — SODIUM CHLORIDE 0.9 % IV BOLUS
1000.0000 mL | Freq: Once | INTRAVENOUS | Status: AC
  Administered 2024-08-15: 1000 mL via INTRAVENOUS

## 2024-08-15 MED ORDER — PANTOPRAZOLE SODIUM 40 MG IV SOLR
40.0000 mg | Freq: Once | INTRAVENOUS | Status: AC
  Administered 2024-08-15: 40 mg via INTRAVENOUS
  Filled 2024-08-15: qty 10

## 2024-08-15 MED ORDER — INFLUENZA VAC SPLIT HIGH-DOSE 0.5 ML IM SUSY
0.5000 mL | PREFILLED_SYRINGE | INTRAMUSCULAR | Status: AC
  Administered 2024-08-16: 0.5 mL via INTRAMUSCULAR
  Filled 2024-08-15: qty 0.5

## 2024-08-15 MED ORDER — MIDODRINE HCL 5 MG PO TABS
2.5000 mg | ORAL_TABLET | Freq: Three times a day (TID) | ORAL | Status: DC
  Administered 2024-08-16 – 2024-08-18 (×7): 2.5 mg via ORAL
  Filled 2024-08-15 (×8): qty 1

## 2024-08-15 MED ORDER — SODIUM CHLORIDE 0.9% IV SOLUTION: Freq: Once | INTRAVENOUS | Status: AC

## 2024-08-15 MED ORDER — LEVOTHYROXINE SODIUM 100 MCG PO TABS
100.0000 ug | ORAL_TABLET | Freq: Every day | ORAL | Status: DC
  Administered 2024-08-16 – 2024-08-18 (×3): 100 ug via ORAL
  Filled 2024-08-15 (×3): qty 1

## 2024-08-15 MED ORDER — ONDANSETRON HCL 4 MG/2ML IJ SOLN
4.0000 mg | Freq: Once | INTRAMUSCULAR | Status: AC
  Administered 2024-08-15: 4 mg via INTRAVENOUS
  Filled 2024-08-15: qty 2

## 2024-08-15 MED ORDER — ACETAMINOPHEN 325 MG PO TABS: 650.0000 mg | ORAL_TABLET | Freq: Four times a day (QID) | ORAL | Status: DC | PRN

## 2024-08-15 MED ORDER — ATORVASTATIN CALCIUM 20 MG PO TABS
20.0000 mg | ORAL_TABLET | Freq: Every evening | ORAL | Status: DC
  Administered 2024-08-15 – 2024-08-17 (×3): 20 mg via ORAL
  Filled 2024-08-15 (×3): qty 1

## 2024-08-15 MED ORDER — ACETAMINOPHEN 650 MG RE SUPP: 650.0000 mg | Freq: Four times a day (QID) | RECTAL | Status: DC | PRN

## 2024-08-15 MED ORDER — INSULIN ASPART 100 UNIT/ML IJ SOLN
0.0000 [IU] | Freq: Four times a day (QID) | INTRAMUSCULAR | Status: DC
  Administered 2024-08-15 (×2): 1 [IU] via SUBCUTANEOUS
  Administered 2024-08-17: 2 [IU] via SUBCUTANEOUS
  Administered 2024-08-18: 4 [IU] via SUBCUTANEOUS
  Administered 2024-08-18 (×2): 2 [IU] via SUBCUTANEOUS
  Filled 2024-08-15 (×3): qty 1

## 2024-08-15 NOTE — ED Triage Notes (Signed)
 EMS reports husband called d/t AMS; husband states she was fine yesterday. A&O to person, place, not to situation or time. pt states her bottom hurts; pt's lower extremities are covered in fecal matter, positive blood occult card. Abdomin is slightly distended, possible non-pitting edema bilaterally, fecal matter on vagina, suspected UTI.

## 2024-08-15 NOTE — ED Provider Notes (Signed)
 [place] [facility] AT [facility] Provider Note   CSN: [id number] Arrival date & time: 08/15/24  1400     Patient presents with: Hyperglycemia  HPI Jeanne Kim is a 83 y.o. female with CAD, type 2 diabetes, history of breast cancer, hypertension presenting for questionable LOC and altered mental status.  EMS reports that husband called because she was found down in her bedroom for an unknown period of time.  Last known well was sometime yesterday.  She seemed to be confused and somewhat disoriented at that time.  She was also noted to be covered in fecal matter.  During my encounter with her she is alert and oriented x 3.  At this time is not endorsing any pain.  She was noted to have positive Hemoccult on arrival and appeared to have some dried blood around her mouth.  She also reports that vomiting started this morning that she noted that it was dark in color.  Denies fever, abdominal pain or chest pain.    Hyperglycemia      Prior to Admission medications   Medication Sig Start Date End Date Taking? Authorizing Provider  amLODipine  (NORVASC ) 2.5 MG tablet Take 2.5 mg by mouth daily. 02/07/24  Yes [provider]  ASPIRIN  LOW DOSE 81 MG tablet Take 81 mg by mouth every morning. 04/17/22  Yes [provider]  atorvastatin  (LIPITOR) 20 MG tablet Take 20 mg by mouth every evening. 08/28/22  Yes [provider]  doxepin  (SINEQUAN ) 10 MG capsule Take 10 mg by mouth at bedtime. 08/28/22  Yes [provider]  DULoxetine  (CYMBALTA ) 60 MG capsule Take 60 mg by mouth daily. 02/07/24  Yes [provider]  EPINEPHrine  0.3 mg/0.3 mL IJ SOAJ injection Inject 0.3 mg into the muscle daily as needed for anaphylaxis. 07/18/09  Yes [provider]  furosemide  (LASIX ) 20 MG tablet Take 1 tablet (20 mg total) by mouth daily. 06/01/22  Yes Landy Barnie RAMAN, NP  Insulin  Glargine-Lixisenatide  (SOLIQUA ) 100-33 UNT-MCG/ML SOPN Inject 20  Units into the skin daily. Sliding Scale 06/01/22  Yes Landy Barnie RAMAN, NP  JARDIANCE  25 MG TABS tablet Take 1 tablet (25 mg total) by mouth daily. 06/01/22  Yes Landy Barnie RAMAN, NP  metFORMIN (GLUCOPHAGE) 1000 MG tablet Take 1,000 mg by mouth 2 (two) times daily. 02/07/24  Yes [provider]  metoprolol  succinate (TOPROL -XL) 25 MG 24 hr tablet Take 25 mg by mouth daily. 02/07/24  Yes [provider]  pantoprazole  (PROTONIX ) 40 MG tablet Take 40 mg by mouth daily. 02/11/24  Yes [provider]  pioglitazone (ACTOS) 45 MG tablet Take 45 mg by mouth daily. 02/07/24  Yes [provider]  potassium chloride  SA (KLOR-CON  M) 20 MEQ tablet Take 1 tablet (20 mEq total) by mouth daily. 09/16/22  Yes Shahmehdi, Seyed A, MD  ramipril  (ALTACE ) 5 MG capsule Take 5 mg by mouth daily. 08/03/24  Yes [provider]  ferrous sulfate  325 (65 FE) MG tablet Take 1 tablet (325 mg total) by mouth daily with breakfast. 03/12/24   Jeanne Celinda Balo, MD  levothyroxine  (SYNTHROID ) 125 MCG tablet Take 1 tablet (125 mcg total) by mouth daily at 6 (six) AM. 03/13/24   Jeanne Celinda Balo, MD  polyethylene glycol powder (GLYCOLAX /MIRALAX ) 17 GM/SCOOP powder Take 17 g by mouth daily. 03/12/24   Jeanne Celinda Balo, MD    Allergies: Codeine, Sulfonamide derivatives, Canagliflozin, Penicillins, and Sulfa antibiotics    Review of Systems See HPI  Physical Exam   Vitals:   08/15/24 1630 08/15/24 1645  BP: (!) 134/52 (!) 124/106  Pulse: (!) 104 98  Resp: 19 (!) 25  Temp:    SpO2: 91% 99%    CONSTITUTIONAL:  well-appearing, NAD NEURO:  Alert and oriented x 3, CN 3-12 grossly intact, moving extremities, responding appropriately to questions EYES:  eyes equal and reactive ENT/NECK:  Supple, no stridor, dried blood on her lower lip and oropharynx CARDIO:  regular rate and rhythm, appears well-perfused  PULM:  No respiratory distress, CTAB GI/GU:  non-distended, soft, non tender,  hemoccult positive MSK/SPINE:  No gross deformities, no edema, moves all extremities  SKIN:  pale, no rash, atraumatic   *Additional and/or pertinent findings included in MDM below   (all labs ordered are listed, but only abnormal results are displayed) Labs Reviewed  COMPREHENSIVE METABOLIC PANEL WITH GFR - Abnormal; Notable for the following components:      Result Value   Glucose, Bld 205 (*)    BUN 48 (*)    Calcium  8.8 (*)    Total Protein 6.2 (*)    Anion gap 16 (*)    All other components within normal limits  CBC WITH DIFFERENTIAL/PLATELET - Abnormal; Notable for the following components:   WBC 14.6 (*)    RBC 2.44 (*)    Hemoglobin 7.0 (*)    HCT 23.1 (*)    Neutro Abs 11.9 (*)    Monocytes Absolute 1.3 (*)    Abs Immature Granulocytes 0.13 (*)    All other components within normal limits  D-DIMER, QUANTITATIVE - Abnormal; Notable for the following components:   D-Dimer, Quant 2.10 (*)    All other components within normal limits  LACTIC ACID, PLASMA - Abnormal; Notable for the following components:   Lactic Acid, Venous 3.7 (*)    All other components within normal limits  CBG MONITORING, ED - Abnormal; Notable for the following components:   Glucose-Capillary 197 (*)    All other components within normal limits  POC OCCULT BLOOD, ED - Abnormal; Notable for the following components:   Fecal Occult Bld POSITIVE (*)    All other components within normal limits  PROTIME-INR  CK  URINALYSIS, W/ REFLEX TO CULTURE (INFECTION SUSPECTED)  LACTIC ACID, PLASMA  TYPE AND SCREEN  PREPARE RBC (CROSSMATCH)  TROPONIN T, HIGH SENSITIVITY  TROPONIN T, HIGH SENSITIVITY    EKG: EKG Interpretation Date/Time:  Saturday August 15 2024 14:35:16 EST Ventricular Rate:  95 PR Interval:    QRS Duration:  86 QT Interval:  421 QTC Calculation: 530 R Axis:   35  Text Interpretation: Atrial fibrillation Inferior infarct, age indeterminate Prolonged QTc Compared with prior EKG  from 03/08/2024 Confirmed by Gennaro Bouchard ([id number]) on 08/15/2024 2:49:13 PM  Radiology: No results found.   SABRAUltrasound ED Peripheral IV (Provider)  Date/Time: 08/15/2024 4:07 PM  Performed by: Jeanne Norleen POUR, PA-C Authorized by: Jeanne Norleen POUR, PA-C   Procedure details:    Indications: poor IV access     Skin Prep: isopropyl alcohol     Location:  Left AC   Angiocath:  20 G   Bedside Ultrasound Guided: Yes     Images: not archived     Patient tolerated procedure without complications: Yes     Dressing applied: Yes   .Critical Care  Performed by: Yaden Seith K, PA-C Authorized by: Jeanne Norleen POUR, PA-C   Critical care provider statement:    Critical care time (minutes):  30  Critical care was necessary to treat or prevent imminent or life-threatening deterioration of the following conditions: GI bleed and critical anemia requiring blood transfusion.   Critical care was time spent personally by me on the following activities:  Development of treatment plan with patient or surrogate, discussions with consultants, evaluation of patient's response to treatment, examination of patient, ordering and review of laboratory studies, ordering and review of radiographic studies, ordering and performing treatments and interventions, pulse oximetry, re-evaluation of patient's condition and review of old charts    Medications Ordered in the ED  0.9 %  sodium chloride  infusion (Manually program via Guardrails IV Fluids) (has no administration in time range)  ondansetron  (ZOFRAN ) injection 4 mg (4 mg Intravenous Given 08/15/24 1542)  sodium chloride  0.9 % bolus 1,000 mL (1,000 mLs Intravenous Bolus from Bag 08/15/24 1543)  pantoprazole  (PROTONIX ) injection 40 mg (40 mg Intravenous Given 08/15/24 1637)    Clinical Course as of 08/15/24 1651  Sat Aug 15, 2024  1631 BMI (Calculated): 26.41 [JR]    Clinical Course User Index [JR] Jeanne Norleen POUR, PA-C                                  Medical Decision Making Amount and/or Complexity of Data Reviewed Labs: ordered. Radiology: ordered.  Risk Prescription drug management.   Initial Impression and Ddx 83 year old well-appearing female presenting for questionable LOC, bloody stools and coffee-ground emesis.  Exam notable for what appears to be coffee-ground colored blood in her emesis around her mouth, positive Hemoccult but otherwise reassuring vitals.  DDx includes upper versus lower GI bleed, symptomatic anemia, intra-abdominal infection, electrolyte derangement, AKI, other. Patient PMH that increases complexity of ED encounter: CAD, type 2 diabetes, history of breast cancer, hypertension   Interpretation of Diagnostics - I independent reviewed and interpreted the labs as followed: Hgb 7.0, lactic 3.7, WBC 14.6, other labs are pending  - CT head and cervical spine are pending  - I personally reviewed and interpreted EKG which revealed atrial fib  Patient Reassessment and Ultimate Disposition/Management Workup revealing concern for GI bleed.  Hemoglobin 7.0.  Ordered 2 units of PRBCs and give her a bolus of Protonix .  Cannot undergo CT due to vomiting.  Will try later.  At this time she is mentating normally, head and neck appear atraumatic and she denies any neck pain or headache or visual disturbance at this time.  White count is slightly elevated but her vitals have been reassuring and she is afebrile, will hold off on antibiotics at this time.  Discussed patient with Dr. Cindie of gastroenterology who was agreeable with plan to admit her and stated to keep her n.p.o. for now and that he would see her first thing tomorrow morning.  She remains hemodynamically stable on room air.  Patient management required discussion with the following services or consulting groups:  None  Complexity of Problems Addressed Acute complicated illness or Injury  Additional Data Reviewed and Analyzed Further history obtained from: Past  medical history and medications listed in the EMR and Prior ED visit notes  Patient Encounter Risk Assessment Consideration of hospitalization      Final diagnoses:  Gastrointestinal hemorrhage, unspecified gastrointestinal hemorrhage type  Anemia, unspecified type    ED Discharge Orders     None          Apollo Timothy K, PA-C 08/15/24 1651    Kammerer, Megan L, DO 08/16/24 [phone]

## 2024-08-15 NOTE — Progress Notes (Signed)
 Consent for blood to be transfused obtained. Blood has been released. Will infuse shortly. Hgb has dropped to a 5.9 and NP has been notified and is aware.

## 2024-08-15 NOTE — H&P (Signed)
 History and Physical    Jeanne Kim FMW:[id number] DOB: [birth year] DOA: 08/15/2024  PCP: Orpha Yancey LABOR, MD   Patient coming from: Home  I have personally briefly reviewed patient's old medical records in [facility]  Chief Complaint: Found down  HPI: Jeanne Kim is a 83 y.o. female with medical history significant for gastric ulcer and anemia, coronary artery disease, diabetes mellitus, hypothyroidism, cognitive impairment. Patient's was brought to the ED after she was found down by her husband at home, there was reports of altered mental status.  On my evaluation, patient is awake alert oriented and able to answer questions, except she cannot remember or tell me why she was on the floor.  Per ED provider it was unknown how long patient had been in the floor, and on arrival to the ED, patient was covered in fecal matter, with dried black vomitus on her mouth..  Patient tells me she fell on the floor today.  She lives at home with her husband who is older than her.  She had 2 episodes of vomiting today-coffee grounds.  She reports NSAID use-ibuprofen  2-3 times a week. She denies black stools.  No blood in stools.  No difficulty breathing no chest pain.  ED Course: Temperature 98.6.  Heart rate 96-104.  Respiratory rate 18-25.  Blood pressure systolic 124-138.  O2 sat 91 to 99% on room air. Hemoglobin 7.  Troponin less than 15.  Lactic acid 3.7. CK 57. WBC 14.6. 1 L bolus given.  1 units PRBC transfused.  EDP talked with Dr. Cindie, keep n.p.o., will see in consult in AM.  Review of Systems: As per HPI all other systems reviewed and negative.  Past Medical History:  Diagnosis Date   Allergy    Anemia    Anginal pain    Anxiety    Arthritis    Blood transfusion without reported diagnosis    Breast cancer (HCC)    Carotid artery plaque    bilat   Cataract    Colon polyps    adenomatous   Concussion 02/2016   Driving restrictions for 6w   Coronary heart disease     Depression    Diverticulosis    Dizziness    Esophageal reflux    Esophageal stricture    Fibromyalgia    Gallstones    Hiatal hernia    Hypercholesterolemia    Hypertension    Hypothyroidism    IBS (irritable bowel syndrome)    Kidney stones    Osteoporosis    Pneumonia    recently, just finished ABO   PONV (postoperative nausea and vomiting)    Sleep apnea    CPAP   Thyroid  disease 2002   Type II or unspecified type diabetes mellitus without mention of complication, not stated as uncontrolled    Unspecified gastritis and gastroduodenitis without mention of hemorrhage     Past Surgical History:  Procedure Laterality Date   ANAL RECTAL MANOMETRY N/A 08/05/2015   Procedure: ANO RECTAL MANOMETRY;  Surgeon: Gustav Shila GAILS, MD;  Location: [facility];  Service: Endoscopy;  Laterality: N/A;   APPENDECTOMY     BIOPSY  08/06/2020   Procedure: BIOPSY;  Surgeon: Cindie Carlin POUR, DO;  [facility];  Service: Endoscopy;;   BIOPSY  12/21/2020   Procedure: BIOPSY;  Surgeon: Eda Iha, MD;  Location: [facility];  Service: Gastroenterology;;   BIOPSY  12/23/2020   Procedure: BIOPSY;  Surgeon: Eda Iha, MD;  Location: [facility];  Service: Gastroenterology;;   BREAST LUMPECTOMY     left   CARDIAC CATHETERIZATION     CATARACT EXTRACTION W/PHACO Left 03/30/2013   Procedure: CATARACT EXTRACTION PHACO AND INTRAOCULAR LENS PLACEMENT (IOC);  Surgeon: Cherene Mania, MD;  Location: [facility];  Service: Ophthalmology;  Laterality: Left;  CDE:19.28   CATARACT EXTRACTION W/PHACO Right 04/09/2013   Procedure: CATARACT EXTRACTION PHACO AND INTRAOCULAR LENS PLACEMENT (IOC);  Surgeon: Cherene Mania, MD;  Location: [facility];  Service: Ophthalmology;  Laterality: Right;  CDE: 15.44   CERVICAL FUSION     CESAREAN SECTION     CHOLECYSTECTOMY     COLONOSCOPY WITH PROPOFOL  N/A 12/23/2020   Procedure: COLONOSCOPY WITH PROPOFOL ;  Surgeon: Eda Iha, MD;  Location: [facility];   Service: Gastroenterology;  Laterality: N/A;   CYSTOCELE REPAIR     DILATION AND CURETTAGE OF UTERUS     ENTEROSCOPY N/A 03/10/2024   Procedure: ENTEROSCOPY;  Surgeon: Suzann Inocente HERO, MD;  Location: [facility] ENDOSCOPY;  Service: Gastroenterology;  Laterality: N/A;   ESOPHAGOGASTRODUODENOSCOPY (EGD) WITH PROPOFOL  N/A 08/06/2020   Procedure: ESOPHAGOGASTRODUODENOSCOPY (EGD) WITH PROPOFOL ;  Surgeon: Cindie Carlin POUR, DO;  [facility];  Service: Endoscopy;  Laterality: N/A;   ESOPHAGOGASTRODUODENOSCOPY (EGD) WITH PROPOFOL  N/A 12/21/2020   Procedure: ESOPHAGOGASTRODUODENOSCOPY (EGD) WITH PROPOFOL ;  Surgeon: Eda Iha, MD;  [facility];  Service: Gastroenterology;  Laterality: N/A;   EYE SURGERY Left 03-30-13   Cataract   EYE SURGERY Right 04-09-13   Cataract   RECTOCELE REPAIR     THYROIDECTOMY     TONSILLECTOMY     TOTAL ABDOMINAL HYSTERECTOMY       reports that she has never smoked. She has never used smokeless tobacco. She reports that she does not drink alcohol and does not use drugs.  Allergies  Allergen Reactions   Codeine Hives   Sulfonamide Derivatives Hives   Canagliflozin    Penicillins Hives    Pt states she does NOT have allergy to penicillin   Sulfa Antibiotics Other (See Comments)    unknown    Family History  Problem Relation Age of Onset   Sarcoidosis Mother    Diabetes Mother    Cancer Mother        sarcoma -  Right arm amputation   Hypertension Mother    Lung cancer Father    Diabetes Father    Heart disease Father        Before age 29   Urolithiasis Father    Cancer Father        Lung   Hyperlipidemia Father    Hypertension Father    Esophageal cancer Maternal Uncle    Ovarian cancer Maternal Aunt    Colon cancer Maternal Uncle    Lung cancer Maternal Uncle    Diabetes Maternal Uncle    Diabetes Maternal Aunt    Colon polyps Maternal Uncle    Heart disease Paternal Grandmother     Prior to Admission medications    Medication Sig Start Date End Date Taking? Authorizing Provider  amLODipine  (NORVASC ) 2.5 MG tablet Take 2.5 mg by mouth daily. 02/07/24   [provider]  ASPIRIN  LOW DOSE 81 MG tablet Take 81 mg by mouth every morning. 04/17/22   [provider]  atorvastatin  (LIPITOR) 20 MG tablet Take 20 mg by mouth every evening. 08/28/22   [provider]  doxepin  (SINEQUAN ) 10 MG capsule Take 10 mg by mouth at bedtime. 08/28/22   [provider]  DULoxetine  (CYMBALTA ) 60  MG capsule Take 60 mg by mouth daily. 02/07/24   [provider]  EPINEPHrine  0.3 mg/0.3 mL IJ SOAJ injection Inject 0.3 mg into the muscle daily as needed for anaphylaxis. 07/18/09   [provider]  ferrous sulfate  325 (65 FE) MG tablet Take 1 tablet (325 mg total) by mouth daily with breakfast. 03/12/24   Odell Celinda Balo, MD  furosemide  (LASIX ) 20 MG tablet Take 1 tablet (20 mg total) by mouth daily. 06/01/22   Landy Barnie RAMAN, NP  Insulin  Glargine-Lixisenatide  (SOLIQUA ) 100-33 UNT-MCG/ML SOPN Inject 20 Units into the skin daily. Sliding Scale 06/01/22   Landy Barnie RAMAN, NP  JARDIANCE  25 MG TABS tablet Take 1 tablet (25 mg total) by mouth daily. 06/01/22   Landy Barnie RAMAN, NP  levothyroxine  (SYNTHROID ) 125 MCG tablet Take 1 tablet (125 mcg total) by mouth daily at 6 (six) AM. 03/13/24   Odell Celinda Balo, MD  metFORMIN (GLUCOPHAGE) 1000 MG tablet Take 1,000 mg by mouth 2 (two) times daily. 02/07/24   [provider]  metoprolol  succinate (TOPROL -XL) 25 MG 24 hr tablet Take 25 mg by mouth daily. 02/07/24   [provider]  pantoprazole  (PROTONIX ) 40 MG tablet Take 40 mg by mouth daily. 02/11/24   [provider]  pioglitazone (ACTOS) 45 MG tablet Take 45 mg by mouth daily. 02/07/24   [provider]  polyethylene glycol powder (GLYCOLAX /MIRALAX ) 17 GM/SCOOP powder Take 17 g by mouth daily. 03/12/24   Odell Celinda Balo, MD  potassium chloride  SA (KLOR-CON  M)  20 MEQ tablet Take 1 tablet (20 mEq total) by mouth daily. 09/16/22   Willette Adriana LABOR, MD    Physical Exam: Vitals:   08/15/24 1420 08/15/24 1430 08/15/24 1549  BP:  (!) 133/97   Pulse:  96   Resp:  18   Temp:   98.6 F (37 C)  TempSrc:   Rectal  SpO2:  98%   Weight: 72 kg    Height: 5' 5 (1.651 m)      Constitutional: NAD, calm, comfortable Vitals:   08/15/24 1420 08/15/24 1430 08/15/24 1549  BP:  (!) 133/97   Pulse:  96   Resp:  18   Temp:   98.6 F (37 C)  TempSrc:   Rectal  SpO2:  98%   Weight: 72 kg    Height: 5' 5 (1.651 m)     Eyes: PERRL, lids and conjunctivae normal ENMT: Mucous membranes are moist.   Neck: normal, supple, no masses, no thyromegaly Respiratory: clear to auscultation bilaterally, no wheezing, no crackles. Normal respiratory effort. No accessory muscle use.  Cardiovascular: Regular rate and rhythm, known-3/6 cardiac murmurs / no rubs / gallops.  Trace bilateral lower extremity edema.  Abdomen: no tenderness, no masses palpated. No hepatosplenomegaly.   Musculoskeletal: no clubbing / cyanosis. No joint deformity upper and lower extremities.  Skin: no rashes, lesions, ulcers. No induration Neurologic: No facial asymmetry, lower extremity spontaneously, speech fluent Psychiatric: Normal judgment and insight. Alert and oriented x 3. Normal mood.   Labs on Admission: I have personally reviewed following labs and imaging studies  CBC: Recent Labs  Lab 08/15/24 1518  WBC 14.6*  NEUTROABS 11.9*  HGB 7.0*  HCT 23.1*  MCV 94.7  PLT 378   Basic Metabolic Panel: Recent Labs  Lab 08/15/24 1518  NA 137  K 3.8  CL 99  CO2 22  GLUCOSE 205*  BUN 48*  CREATININE 0.70  CALCIUM  8.8*   GFR: Estimated Creatinine Clearance:  54.9 mL/min (by C-G formula based on SCr of 0.7 mg/dL). Liver Function Tests: Recent Labs  Lab 08/15/24 1518  AST 18  ALT 12  ALKPHOS 56  BILITOT 0.9  PROT 6.2*  ALBUMIN  3.7   Coagulation Profile: Recent Labs   Lab 08/15/24 1518  INR 1.0   Cardiac Enzymes: Recent Labs  Lab 08/15/24 1518  CKTOTAL 57   CBG: Recent Labs  Lab 08/15/24 1407  GLUCAP 197*   Urine analysis:    Component Value Date/Time   COLORURINE YELLOW 08/15/2024 1420   APPEARANCEUR HAZY (A) 08/15/2024 1420   LABSPEC 1.023 08/15/2024 1420   PHURINE 7.0 08/15/2024 1420   GLUCOSEU >=500 (A) 08/15/2024 1420   HGBUR NEGATIVE 08/15/2024 1420   BILIRUBINUR NEGATIVE 08/15/2024 1420   KETONESUR 20 (A) 08/15/2024 1420   PROTEINUR NEGATIVE 08/15/2024 1420   NITRITE NEGATIVE 08/15/2024 1420   LEUKOCYTESUR MODERATE (A) 08/15/2024 1420    Radiological Exams on Admission: No results found.  EKG: Independently reviewed.  Rhythm regular, P waves mostly present, rate 95, QTc 530.  Read as atrial fibrillation by device.  Assessment/Plan Principal Problem:   Acute on chronic anemia Active Problems:   Hypothyroidism   Essential hypertension   GI bleed   Hypertension associated with type 2 diabetes mellitus (HCC)   Assessment and Plan:  Acute on chronic anemia, history of GI bleed-hemoglobin 7, last hemoglobin 8.6-on discharge from the hospital for GI bleed.  Coffee-ground emesis in ED.  Positive NSAID use-ibuprofen  2-3 times a week.  EGD, small bowel enteroscopy 03/10/2024 showed gastritis, thought to be etiology of patient's anemia. -EDP talk to Dr. Cindie, keep n.p.o., will see in consult in a.m. - 1 L bolus given, transfuse 1 unit - Trend H&H - IV Protonix  40 twice daily - Hold aspirin  81mg   Fall-found down, initial reported altered mental status, patient's mental status is intact here.  Unknown mechanism for fall.  History of cognitive impairment.  Head and cervical CT negative for acute abnormality.  Lives at home with husband who is older than she is.  EKG, troponins unremarkable.  She appears dehydrated. - PT eval - CK normal 57  Hypertension-stable. -Hold Norvasc  2.5, Lasix  20 mg, metoprolol  25 mg daily,  metoprolol  5 mg daily for now, in the setting of GI bleed  Diabetes mellitus- - SSI- S - Hold Actos, metformin, Jardiance  - HgbA1c - Hold long-acting insulin  20 units daily  Hypothyroidism -Resume Synthroid   Mood disorder,  - awaiting med rec  DVT prophylaxis: SCDs Code Status: Full code Family Communication:  None at bedside Disposition Plan: ~ 2 days Consults called: GI Admission status: OBS, telemetry   Author: Tully FORBES Carwin, MD 08/15/2024 6:54 PM  For on call review [email or web address].

## 2024-08-16 ENCOUNTER — Observation Stay (HOSPITAL_COMMUNITY): Admitting: Anesthesiology

## 2024-08-16 ENCOUNTER — Observation Stay (HOSPITAL_COMMUNITY)

## 2024-08-16 ENCOUNTER — Encounter (HOSPITAL_COMMUNITY)
Admission: EM | Disposition: A | Discharge status: Home Health | Payer: Self-pay | Source: Home / Self Care | Attending: Family Medicine

## 2024-08-16 DIAGNOSIS — D500 Iron deficiency anemia secondary to blood loss (chronic): Secondary | ICD-10-CM

## 2024-08-16 DIAGNOSIS — K449 Diaphragmatic hernia without obstruction or gangrene: Secondary | ICD-10-CM | POA: Diagnosis not present

## 2024-08-16 DIAGNOSIS — K920 Hematemesis: Secondary | ICD-10-CM

## 2024-08-16 DIAGNOSIS — K222 Esophageal obstruction: Secondary | ICD-10-CM

## 2024-08-16 DIAGNOSIS — K922 Gastrointestinal hemorrhage, unspecified: Secondary | ICD-10-CM | POA: Diagnosis not present

## 2024-08-16 DIAGNOSIS — D649 Anemia, unspecified: Secondary | ICD-10-CM | POA: Diagnosis not present

## 2024-08-16 HISTORY — PX: ESOPHAGOGASTRODUODENOSCOPY: SHX5428

## 2024-08-16 LAB — BASIC METABOLIC PANEL WITH GFR
Anion gap: 10 (ref 5–15)
BUN: 43 mg/dL — ABNORMAL HIGH (ref 8–23)
CO2: 23 mmol/L (ref 22–32)
Calcium: 8.1 mg/dL — ABNORMAL LOW (ref 8.9–10.3)
Chloride: 105 mmol/L (ref 98–111)
Creatinine, Ser: 0.64 mg/dL (ref 0.44–1.00)
GFR, Estimated: >60 mL/min (ref >60)
Glucose, Bld: 102 mg/dL — ABNORMAL HIGH (ref 70–99)
Potassium: 3.3 mmol/L — ABNORMAL LOW (ref 3.5–5.1)
Sodium: 138 mmol/L (ref 135–145)

## 2024-08-16 LAB — HEMOGLOBIN A1C
Hgb A1c MFr Bld: 6 % — ABNORMAL HIGH (ref 4.8–5.6)
Mean Plasma Glucose: 125.5 mg/dL

## 2024-08-16 LAB — GLUCOSE, CAPILLARY
Glucose-Capillary: 108 mg/dL — ABNORMAL HIGH (ref 70–99)
Glucose-Capillary: 91 mg/dL (ref 70–99)
Glucose-Capillary: 159 mg/dL — ABNORMAL HIGH (ref 70–99)
Glucose-Capillary: 117 mg/dL — ABNORMAL HIGH (ref 70–99)

## 2024-08-16 LAB — CBC
HCT: 22.4 % — ABNORMAL LOW (ref 36.0–46.0)
Hemoglobin: 7.1 g/dL — ABNORMAL LOW (ref 12.0–15.0)
MCH: 29 pg (ref 26.0–34.0)
MCHC: 31.7 g/dL (ref 30.0–36.0)
MCV: 91.4 fL (ref 80.0–100.0)
Platelets: 293 K/uL (ref 150–400)
RBC: 2.45 MIL/uL — ABNORMAL LOW (ref 3.87–5.11)
RDW: 14.9 % (ref 11.5–15.5)
WBC: 11.8 K/uL — ABNORMAL HIGH (ref 4.0–10.5)
nRBC: 0 % (ref 0.0–0.2)

## 2024-08-16 LAB — PREPARE RBC (CROSSMATCH)

## 2024-08-16 LAB — HEMOGLOBIN AND HEMATOCRIT, BLOOD
HCT: 23.2 % — ABNORMAL LOW (ref 36.0–46.0)
Hemoglobin: 7.7 g/dL — ABNORMAL LOW (ref 12.0–15.0)

## 2024-08-16 SURGERY — EGD (ESOPHAGOGASTRODUODENOSCOPY)
Anesthesia: General

## 2024-08-16 MED ORDER — SODIUM CHLORIDE 0.9% IV SOLUTION: Freq: Once | INTRAVENOUS | Status: DC

## 2024-08-16 MED ORDER — MIDAZOLAM HCL 2 MG/2ML IJ SOLN
INTRAMUSCULAR | Status: AC
  Filled 2024-08-16: qty 2

## 2024-08-16 MED ORDER — MIDAZOLAM HCL 5 MG/5ML IJ SOLN
INTRAMUSCULAR | Status: DC | PRN
  Administered 2024-08-16: 1 mg via INTRAVENOUS

## 2024-08-16 MED ORDER — POTASSIUM CHLORIDE CRYS ER 20 MEQ PO TBCR
40.0000 meq | EXTENDED_RELEASE_TABLET | Freq: Once | ORAL | Status: AC
  Administered 2024-08-16: 40 meq via ORAL
  Filled 2024-08-16: qty 2

## 2024-08-16 MED ORDER — DEXMEDETOMIDINE HCL IN NACL 80 MCG/20ML IV SOLN
INTRAVENOUS | Status: DC | PRN
  Administered 2024-08-16: 16 ug via INTRAVENOUS

## 2024-08-16 MED ORDER — EPHEDRINE 5 MG/ML INJ
INTRAVENOUS | Status: AC
  Filled 2024-08-16: qty 5

## 2024-08-16 MED ORDER — PROPOFOL 10 MG/ML IV BOLUS
INTRAVENOUS | Status: DC | PRN
  Administered 2024-08-16 (×3): 25 mg via INTRAVENOUS
  Administered 2024-08-16: 50 mg via INTRAVENOUS

## 2024-08-16 MED ORDER — LACTATED RINGERS IV SOLN: INTRAVENOUS | Status: DC | PRN

## 2024-08-16 MED ORDER — LIDOCAINE 2% (20 MG/ML) 5 ML SYRINGE
INTRAMUSCULAR | Status: AC
Start: 2024-08-16 — End: 2024-08-16
  Filled 2024-08-16: qty 5

## 2024-08-16 MED ORDER — TRAMADOL HCL 50 MG PO TABS
50.0000 mg | ORAL_TABLET | Freq: Four times a day (QID) | ORAL | Status: DC | PRN
Start: 2024-08-16 — End: 2024-08-16
  Administered 2024-08-16: 50 mg via ORAL
  Filled 2024-08-16: qty 1

## 2024-08-16 MED ORDER — LIDOCAINE HCL (CARDIAC) PF 100 MG/5ML IV SOSY
PREFILLED_SYRINGE | INTRAVENOUS | Status: DC | PRN
  Administered 2024-08-16: 50 mg via INTRAVENOUS

## 2024-08-16 MED ORDER — EPHEDRINE SULFATE (PRESSORS) 25 MG/5ML IV SOSY
PREFILLED_SYRINGE | INTRAVENOUS | Status: DC | PRN
  Administered 2024-08-16 (×2): 10 mg via INTRAVENOUS

## 2024-08-16 MED ORDER — SODIUM CHLORIDE 0.9% IV SOLUTION: Freq: Once | INTRAVENOUS | Status: AC

## 2024-08-16 MED ORDER — DOXEPIN HCL 10 MG PO CAPS
10.0000 mg | ORAL_CAPSULE | Freq: Every day | ORAL | Status: DC
  Administered 2024-08-16 – 2024-08-17 (×2): 10 mg via ORAL
  Filled 2024-08-16 (×3): qty 1

## 2024-08-16 MED ORDER — SUCRALFATE 1 GM/10ML PO SUSP
1.0000 g | Freq: Three times a day (TID) | ORAL | Status: DC
  Administered 2024-08-16 – 2024-08-18 (×9): 1 g via ORAL
  Filled 2024-08-16 (×9): qty 10

## 2024-08-16 MED ORDER — OXYCODONE HCL 5 MG PO TABS
5.0000 mg | ORAL_TABLET | ORAL | Status: DC | PRN
Start: 2024-08-16 — End: 2024-08-18
  Administered 2024-08-16 – 2024-08-18 (×6): 5 mg via ORAL
  Filled 2024-08-16 (×6): qty 1

## 2024-08-16 NOTE — Plan of Care (Signed)

## 2024-08-16 NOTE — Anesthesia Postprocedure Evaluation (Signed)
 Anesthesia Post Note  Patient: Jeanne Kim  Procedure(s) Performed: EGD (ESOPHAGOGASTRODUODENOSCOPY)  Patient location during evaluation: PACU Anesthesia Type: General Level of consciousness: awake and alert Pain management: pain level controlled Vital Signs Assessment: post-procedure vital signs reviewed and stable Respiratory status: spontaneous breathing, nonlabored ventilation, respiratory function stable and patient connected to nasal cannula oxygen Cardiovascular status: blood pressure returned to baseline and stable Postop Assessment: no apparent nausea or vomiting Anesthetic complications: no   There were no known notable events for this encounter.   Last Vitals:  Vitals:   08/16/24 1045 08/16/24 1100  BP: (!) 99/51 119/66  Pulse: 88 87  Resp: 17 18  Temp:    SpO2: 97% 97%    Last Pain:  Vitals:   08/16/24 1100  TempSrc:   PainSc: 0-No pain                 Jeanne Kim

## 2024-08-16 NOTE — Discharge Instructions (Signed)
 Food Agency Name: [facility] Address: [address] [place], [place] [address] Phone: [phone] Website: www.adtsrc.org Services Offered: Meals on [employer] and Meals with Friends.   Home care, at home assisted living, volunteer services, [facility] Ret[facility], transportation  Agency Name: [facility] Address: Sites vary. Must call first. Food Pantry location: [address], [place], [place] [address] [employer] Phone: [phone] Website: none Services Offered: Museum/gallery curator, utility assistance if funds available Careers adviser for all of [place], [employer], [employer], Office manager and [employer] for [employer] area only) Walk-in  current Id and current address verification required.  Wed-Thurs: 9:30-12:00 Agency Name: [facility] Address: [address], [place], [place] [address]  Phone: [phone] or [phone] Website: none Services Offered: Food assistance Agency Name: [employer] [facility] Address: [address] [place], [place] [address]  Phone: [phone] or [phone]  Website: none Services Offered: Serves 1 hot meal a day at 11:00 am Monday-Sunday and 5 pm  on the second and fourth Sunday of each month Agency Name: [facility] and [facility]  Services/Social Services  Address: [phone], [place], [place] [address] Phone: [phone] Website: [email or web address].[email or web address].[email or web address]  [email or web address] Services Offered: Sales executive, [facility] program Agency Name: [facility] Address: [address] [place], [place] [address] Phone: [phone] Website: www.[place]hope.[email or web address] Services Offered: Food pantry Tuesday, Wednesday and Thursday 9am-11:30am  (need appointment) and health clinic (9:00am-11:00am) Agency Name: [employer] [facility] Address: [address], [place] / [address], [place] Phone: [phone] [place] / [phone] [place] Website: OpinionTrades.tn NetworkAffair.co.za Services Offered: Civil Service fast streamer, food pantry, soup kitchen ([place]) emergency financial  assistance, thrift stores, showers & hygiene products ([place]),  Christmas Assistance, spiritual help

## 2024-08-16 NOTE — Progress Notes (Signed)
 Blood Complete at 1538. No suspected reaction. Kellogg RN

## 2024-08-16 NOTE — Progress Notes (Signed)
 [facility] PROGRESS NOTE  Jeanne Kim (DOB: 13-Dec-1940) FMW:[id number] PCP: Orpha Yancey LABOR, MD  Brief Narrative: Jeanne Kim is an 83 y.o. female with medical history significant for gastric ulcer and anemia, coronary artery disease, diabetes mellitus, hypothyroidism, cognitive impairment. Patient's was brought to the ED after she was found down by her husband at home, there was reports of altered mental status.  On my evaluation, patient is awake alert oriented and able to answer questions, except she cannot remember or tell me why she was on the floor.  Per ED provider it was unknown how long patient had been in the floor, and on arrival to the ED, patient was covered in fecal matter, with dried black vomitus on her mouth..  Patient tells me she fell on the floor today.  She lives at home with her husband who is older than her.  She had 2 episodes of vomiting today-coffee grounds.  She reports NSAID use-ibuprofen  2-3 times a week. She denies black stools.  No blood in stools.  No difficulty breathing no chest pain.   ED Course: Temperature 98.6.  Heart rate 96-104.  Respiratory rate 18-25.  Blood pressure systolic 124-138.  O2 sat 91 to 99% on room air. Hemoglobin 7.  Troponin less than 15.  Lactic acid 3.7. CK 57. WBC 14.6. 1 L bolus given.  1 units PRBC transfused.  EDP talked with Dr. Cindie, keep n.p.o., will see in consult in AM.  Subjective: Denies pain or other concerns but says she feels like ... Says she doesn't feel good but can't elaborate further. No additional vomiting since arrival to floor.   Objective: BP (!) 129/56   Pulse 85   Temp 98.2 F (36.8 C) (Oral)   Resp 17   Ht 5' 5 (1.651 m)   Wt 72 kg   SpO2 96%   BMI 26.41 kg/m   Gen: Elderly, frail female in no distress Pulm: Clear, nonlabored  CV: RRR, no MRG GI: Soft, NT, ND, +BS  Neuro: Alert and interactive. No new focal deficits. Ext: Warm, no deformities. Skin: No rashes, lesions or ulcers on  visualized skin   EGD 11/9 Dr. Cindie: Impression:        - Mild Schatzki ring.                           - Large hiatal hernia with multiple Cameron ulcers.                            Treated with bipolar cautery.                           - Normal duodenal bulb, first portion of the                            duodenum and second portion of the duodenum.                           - No specimens collected.  Recommendation:           - Return patient to hospital ward for ongoing care.                           - Soft diet.                           -  Continue IV PPI twice daily while inpatient.                            Transition to 40 mg p.o. twice daily upon discharge.                           - Carafate 1g QID                           - Needs to avoid all NSAIDs                           - Transfuse 1 unit PRBCs. Continue to monitor H&H                           - Otherwise supportive care  Assessment & Plan: Acute on chronic anemia, history of GI bleed-hemoglobin 7, last hemoglobin 8.6-on discharge from the hospital for GI bleed.  Coffee-ground emesis in ED.  Positive NSAID use-ibuprofen  2-3 times a week.  EGD, small bowel enteroscopy 03/10/2024 showed gastritis, thought to be etiology of patient's anemia. - 1u RBCs 11/8, will repeat 1u RBCs 11/9 and trend H/H.  - Continue PPI IV BID, add carafate 1g QID - Hold aspirin  81mg , avoid all NSAIDs   Fall-found down, initial reported altered mental status, patient's mental status is intact here.  Unknown mechanism for fall.  History of cognitive impairment.  Head and cervical CT negative for acute abnormality.  Lives at home with husband who is older than she is.  EKG, troponins unremarkable.  She appears dehydrated, CK is normal. - PT eval pending.    Hypertension: Also with midodrine  on med list which looked to be discontinued last admission.  - Hold antihypertensives for now, likely to be able to DC midodrine  as well, no changes  periprocedurally though. - Likely to restart some medications in next 24 hours   T2DM:  - Hold home oral medications - Continue SSI, holding basal insulin  (20u) for now with unclear oral intake plans   Hypothyroidism - Continue synthroid    Mood disorder, insomnia:  - Hold sedating medications if able, will restart qHS doxepin   HLD:  - Can restart statin at discharge   Bernardino KATHEE Come, MD [facility] [email or web address] 08/16/2024, 2:01 PM

## 2024-08-16 NOTE — Anesthesia Preprocedure Evaluation (Addendum)
 Anesthesia Evaluation  Patient identified by MRN, date of birth, ID band Patient awake    Reviewed: Allergy & Precautions, NPO status , Patient's Chart, lab work & pertinent test results, reviewed documented beta blocker date and time   History of Anesthesia Complications (+) PONV and history of anesthetic complications  Airway Mallampati: III  TM Distance: >3 FB Neck ROM: Full    Dental no notable dental hx. (+) Teeth Intact, Dental Advisory Given   Pulmonary shortness of breath, sleep apnea , pneumonia, resolved   Pulmonary exam normal breath sounds clear to auscultation       Cardiovascular hypertension, Pt. on medications + angina  + CAD  Normal cardiovascular exam Rhythm:Regular Rate:Normal  Good EF. Grade 1 diastolic dysfunction   Neuro/Psych  PSYCHIATRIC DISORDERS Anxiety Depression    Carotid artery disease    GI/Hepatic Neg liver ROS, hiatal hernia, PUD,GERD  Medicated,,Esophageal stricture.  Acute GI bleeding   Endo/Other  diabetesHypothyroidism    Renal/GU Renal diseaseHx/o renal calculi  negative genitourinary   Musculoskeletal  (+) Arthritis ,  Fibromyalgia -  Abdominal   Peds  Hematology  (+) Blood dyscrasia, anemia   Anesthesia Other Findings Breast cancer  Reproductive/Obstetrics                              Anesthesia Physical Anesthesia Plan  ASA: 3  Anesthesia Plan: General   Post-op Pain Management: Minimal or no pain anticipated   Induction: Intravenous  PONV Risk Score and Plan: 3 and Treatment may vary due to age or medical condition and Propofol  infusion  Airway Management Planned: Nasal Cannula and Natural Airway  Additional Equipment: None  Intra-op Plan:   Post-operative Plan:   Informed Consent: I have reviewed the patients History and Physical, chart, labs and discussed the procedure including the risks, benefits and alternatives for the  proposed anesthesia with the patient or authorized representative who has indicated his/her understanding and acceptance.     Dental advisory given  Plan Discussed with: CRNA  Anesthesia Plan Comments:          Anesthesia Quick Evaluation

## 2024-08-16 NOTE — Care Management Obs Status (Signed)
 MEDICARE OBSERVATION STATUS NOTIFICATION   Patient Details  Name: Jeanne Kim MRN: [id number] Date of Birth: 05/10/1941   Medicare Observation Status Notification Given:  Yes    [name], LCSWA 08/16/2024, 2:55 PM

## 2024-08-16 NOTE — TOC CM/SW Note (Signed)
 Transition of Care [facility]) - Inpatient Brief Assessment   Patient Details  Name: Jeanne Kim MRN: [id number] Date of Birth: 06/14/1941  [facility] CM/SW Contact:    [name], LCSWA Phone Number: 08/16/2024, 2:58 PM   Clinical Narrative:  Inpatient Care Management (ICM) has reviewed patient and no other ICM needs have been identified at this time. We will continue to monitor patient advancement through interdisciplinary progression rounds. If new patient transition needs arise, please place a ICM consult.  Patient has no transportation issues, states that she has a car or her daughter takes her where she needs to go. Patient expressed that food is expensive , but that is the only barrier, will add resources.   Transition of Care Asessment: Insurance and Status: Insurance coverage has been reviewed Patient has primary care physician: Yes Home environment has been reviewed: From Home Prior level of function:: Independent Prior/Current Home Services: No current home services Social Drivers of Health Review: SDOH reviewed interventions complete (SDOH Food resources added) Readmission risk has been reviewed: Yes Transition of care needs: no transition of care needs at this time

## 2024-08-16 NOTE — Consult Note (Signed)
 Consulting  Provider: Norleen Essex PA Primary Care Physician:  Orpha Yancey LABOR, MD Primary Gastroenterologist:  Dr. Shila  Reason for Consultation: Coffee-ground emesis, acute blood loss anemia  HPI:  Jeanne Kim is a 83 y.o. female with a past medical history of diabetes, breast cancer, hypertension, fibromyalgia, IBS, CAD, hypothyroidism, prolonged QT, who presented to [facility], ER 08/15/2024 due to coffee-ground emesis.  Per reports, patient was found down by her husband at home with altered mental status.  Unknown how long she was down though patient recovered rather quickly from a mentation standpoint in the ER.  Per ER provider patient was covered in fecal matter with dried black vomitus around her mouth.  She reports 2 episodes of coffee-ground emesis.  Takes a baby aspirin  daily.  Uses Aleve 2-3 times per week.  Denies any melena hematochezia.  In the ER found to have hemoglobin 5.9.  7.1 this a.m. after receiving 2 units PRBCs.  Has been started on IV Protonix .  Admitted to [facility] 03/08/2024 due to anemia/UTI. Small bowel enteroscopy 03/10/2024 showed evidence of gastritis in the hiatal hernia sac but no definite Cameron's erosions.  This is potentially a source of bleeding.  Improved with supportive care.  Procedure history: Small bowel enteroscopy 03/10/2024 showed evidence of gastritis in the hiatal hernia sac but no definite Cameron's erosions.  This is potentially a source of bleeding.   EGD 12/21/2020 medium sized hiatal hernia no Cameron erosions noted multiple sessile gastric polyps.   Colonoscopy 12/23/2020 few diverticuli in the descending colon, internal hemorrhoids and an area of ulcerated mucosa in the distal sigmoid suspicious for ischemic colitis. Biopsies did show nonspecific colitis consistent with clinical impression of ischemia.   Past Medical History:  Diagnosis Date   Allergy    Anemia    Anginal pain    Anxiety    Arthritis    Blood  transfusion without reported diagnosis    Breast cancer (HCC)    Carotid artery plaque    bilat   Cataract    Colon polyps    adenomatous   Concussion 02/2016   Driving restrictions for 6w   Coronary heart disease    Depression    Diverticulosis    Dizziness    Esophageal reflux    Esophageal stricture    Fibromyalgia    Gallstones    Hiatal hernia    Hypercholesterolemia    Hypertension    Hypothyroidism    IBS (irritable bowel syndrome)    Kidney stones    Osteoporosis    Pneumonia    recently, just finished ABO   PONV (postoperative nausea and vomiting)    Sleep apnea    CPAP   Thyroid  disease 2002   Type II or unspecified type diabetes mellitus without mention of complication, not stated as uncontrolled    Unspecified gastritis and gastroduodenitis without mention of hemorrhage     Past Surgical History:  Procedure Laterality Date   ANAL RECTAL MANOMETRY N/A 08/05/2015   Procedure: ANO RECTAL MANOMETRY;  Surgeon: Gustav Shila GAILS, MD;  Location: [facility] ENDOSCOPY;  Service: Endoscopy;  Laterality: N/A;   APPENDECTOMY     BIOPSY  08/06/2020   Procedure: BIOPSY;  Surgeon: Cindie Carlin POUR, DO;  Location: [facility];  Service: Endoscopy;;   BIOPSY  12/21/2020   Procedure: BIOPSY;  Surgeon: Eda Iha, MD;  Location: [facility]OS[facility];  Service: Gastroenterology;;   BIOPSY  12/23/2020   Procedure: BIOPSY;  Surgeon: Eda Iha, MD;  Location: [facility]OSCOPY;  Service: Gastroenterology;;   BREAST LUMPECTOMY     left   CARDIAC CATHETERIZATION     CATARACT EXTRACTION W/PHACO Left 03/30/2013   Procedure: CATARACT EXTRACTION PHACO AND INTRAOCULAR LENS PLACEMENT (IOC);  Surgeon: Cherene Mania, MD;  Location: [facility] ORS;  Service: Ophthalmology;  Laterality: Left;  CDE:19.28   CATARACT EXTRACTION W/PHACO Right 04/09/2013   Procedure: CATARACT EXTRACTION PHACO AND INTRAOCULAR LENS PLACEMENT (IOC);  Surgeon: Cherene Mania, MD;  Location: [facility] OR[facility];  Service: Ophthalmology;  Laterality:  Right;  CDE: 15.44   CERVICAL FUSION     CESAREAN SECTION     CHOLECYSTECTOMY     COLONOSCOPY WITH PROPOFOL  N/A 12/23/2020   Procedure: COLONOSCOPY WITH PROPOFOL ;  Surgeon: Eda Iha, MD;  Location: [facility];  Service: Gastroenterology;  Laterality: N/A;   CYSTOCELE REPAIR     DILATION AND CURETTAGE OF UTERUS     ENTEROSCOPY N/A 03/10/2024   Procedure: ENTEROSCOPY;  Surgeon: Suzann Inocente HERO, MD;  Location: [facility] ENDOSCOPY;  Service: Gastroenterology;  Laterality: N/A;   ESOPHAGOGASTRODUODENOSCOPY (EGD) WITH PROPOFOL  N/A 08/06/2020   Procedure: ESOPHAGOGASTRODUODENOSCOPY (EGD) WITH PROPOFOL ;  Surgeon: Cindie Carlin POUR, DO;  [facility];  Service: Endoscopy;  Laterality: N/A;   ESOPHAGOGASTRODUODENOSCOPY (EGD) WITH PROPOFOL  N/A 12/21/2020   Procedure: ESOPHAGOGASTRODUODENOSCOPY (EGD) WITH PROPOFOL ;  Surgeon: Eda Iha, MD;  Location: [facility];  Service: Gastroenterology;  Laterality: N/A;   EYE SURGERY Left 03-30-13   Cataract   EYE SURGERY Right 04-09-13   Cataract   RECTOCELE REPAIR     THYROIDECTOMY     TONSILLECTOMY     TOTAL ABDOMINAL HYSTERECTOMY      Prior to Admission medications   Medication Sig Start Date End Date Taking? Authorizing Provider  amLODipine  (NORVASC ) 2.5 MG tablet Take 2.5 mg by mouth daily. 02/07/24  Yes [provider]  ASPIRIN  LOW DOSE 81 MG tablet Take 81 mg by mouth every morning. 04/17/22  Yes [provider]  atorvastatin  (LIPITOR) 20 MG tablet Take 20 mg by mouth every evening. 08/28/22  Yes [provider]  doxepin  (SINEQUAN ) 10 MG capsule Take 10 mg by mouth at bedtime. 08/28/22  Yes [provider]  DULoxetine  (CYMBALTA ) 60 MG capsule Take 60 mg by mouth daily. 02/07/24  Yes [provider]  EPINEPHrine  0.3 mg/0.3 mL IJ SOAJ injection Inject 0.3 mg into the muscle daily as needed for anaphylaxis. 07/18/09  Yes [provider]  ferrous sulfate  325 (65 FE) MG tablet Take 1  tablet (325 mg total) by mouth daily with breakfast. 03/12/24  Yes Odell Celinda Balo, MD  furosemide  (LASIX ) 20 MG tablet Take 1 tablet (20 mg total) by mouth daily. 06/01/22  Yes Landy Barnie RAMAN, NP  Insulin  Glargine-Lixisenatide  (SOLIQUA ) 100-33 UNT-MCG/ML SOPN Inject 20 Units into the skin daily. Sliding Scale 06/01/22  Yes Landy Barnie RAMAN, NP  JARDIANCE  25 MG TABS tablet Take 1 tablet (25 mg total) by mouth daily. 06/01/22  Yes Landy Barnie RAMAN, NP  levothyroxine  (SYNTHROID ) 100 MCG tablet Take 100 mcg by mouth daily before breakfast.   Yes [provider]  metFORMIN (GLUCOPHAGE) 1000 MG tablet Take 1,000 mg by mouth 2 (two) times daily. 02/07/24  Yes [provider]  metoprolol  succinate (TOPROL -XL) 25 MG 24 hr tablet Take 25 mg by mouth daily. 02/07/24  Yes [provider]  midodrine  (PROAMATINE ) 2.5 MG tablet Take 2.5 mg by mouth 3 (three) times daily. 08/03/24  Yes [provider]  pantoprazole  (PROTONIX ) 40 MG  tablet Take 40 mg by mouth daily. 02/11/24  Yes [provider]  pioglitazone (ACTOS) 45 MG tablet Take 45 mg by mouth daily. 02/07/24  Yes [provider]  polyethylene glycol powder (GLYCOLAX /MIRALAX ) 17 GM/SCOOP powder Take 17 g by mouth daily. 03/12/24  Yes Odell Celinda Balo, MD  potassium chloride  SA (KLOR-CON  M) 20 MEQ tablet Take 1 tablet (20 mEq total) by mouth daily. 09/16/22  Yes Shahmehdi, Seyed A, MD  ramipril  (ALTACE ) 5 MG capsule Take 5 mg by mouth daily. 08/03/24  Yes [provider]  levothyroxine  (SYNTHROID ) 125 MCG tablet Take 1 tablet (125 mcg total) by mouth daily at 6 (six) AM. Patient not taking: Reported on 08/15/2024 03/13/24   Odell Celinda Balo, MD    Current Facility-Administered Medications  Medication Dose Route Frequency Provider Last Rate Last Admin   0.9 %  sodium chloride  infusion (Manually program via Guardrails IV Fluids)   Intravenous Once Bryn Bernardino NOVAK, MD       acetaminophen  (TYLENOL )  tablet 650 mg  650 mg Oral Q6H PRN Emokpae, Ejiroghene E, MD       Or   acetaminophen  (TYLENOL ) suppository 650 mg  650 mg Rectal Q6H PRN Emokpae, Ejiroghene E, MD       atorvastatin  (LIPITOR) tablet 20 mg  20 mg Oral QPM Emokpae, Ejiroghene E, MD   20 mg at 08/15/24 2105   Influenza vac split trivalent PF (FLUZONE HIGH-DOSE) injection 0.5 mL  0.5 mL Intramuscular Tomorrow-1000 Emokpae, Ejiroghene E, MD       insulin  aspart (novoLOG ) injection 0-9 Units  0-9 Units Subcutaneous Q6H Emokpae, Ejiroghene E, MD   1 Units at 08/15/24 2337   levothyroxine  (SYNTHROID ) tablet 100 mcg  100 mcg Oral QAC breakfast Emokpae, Ejiroghene E, MD   100 mcg at 08/16/24 9493   midodrine  (PROAMATINE ) tablet 2.5 mg  2.5 mg Oral TID Emokpae, Ejiroghene E, MD       pantoprazole  (PROTONIX ) injection 40 mg  40 mg Intravenous Q12H Emokpae, Ejiroghene E, MD   40 mg at 08/16/24 0507   polyethylene glycol (MIRALAX  / GLYCOLAX ) packet 17 g  17 g Oral Daily PRN Emokpae, Ejiroghene E, MD       promethazine  (PHENERGAN ) 12.5 mg in sodium chloride  0.9 % 50 mL IVPB  12.5 mg Intravenous Q8H PRN Emokpae, Ejiroghene E, MD        Allergies as of 08/15/2024 - Review Complete 08/15/2024  Allergen Reaction Noted   Codeine Hives 04/27/2009   Sulfonamide derivatives Hives 04/27/2009   Canagliflozin  12/20/2020   Penicillins Hives 12/20/2020   Sulfa antibiotics Other (See Comments) 05/06/2014    Family History  Problem Relation Age of Onset   Sarcoidosis Mother    Diabetes Mother    Cancer Mother        sarcoma -  Right arm amputation   Hypertension Mother    Lung cancer Father    Diabetes Father    Heart disease Father        Before age 30   Urolithiasis Father    Cancer Father        Lung   Hyperlipidemia Father    Hypertension Father    Esophageal cancer Maternal Uncle    Ovarian cancer Maternal Aunt    Colon cancer Maternal Uncle    Lung cancer Maternal Uncle    Diabetes Maternal Uncle    Diabetes Maternal Aunt     Colon polyps Maternal Uncle    Heart disease Paternal Grandmother  Social History   Socioeconomic History   Marital status: Married    Spouse name: Not on file   Number of children: 4   Years of education: Not on file   Highest education level: Not on file  Occupational History   Occupation: Retired Academic Librarian: RETIRED  Tobacco Use   Smoking status: Never   Smokeless tobacco: Never  Vaping Use   Vaping status: Never Used  Substance and Sexual Activity   Alcohol use: No   Drug use: No   Sexual activity: Not on file  Other Topics Concern   Not on file  Social History Narrative   Not on file   Social Drivers of Health   Financial Resource Strain: Low Risk  (10/19/2022)   Received [facility]   Overall Financial Resource Strain (CARDIA)    Difficulty of Paying Living Expenses: Not hard at all  Food Insecurity: Food Insecurity Present (08/15/2024)   Hunger Vital Sign    Worried About Running Out of Food in the Last Year: Sometimes true    Ran Out of Food in the Last Year: Sometimes true  Transportation Needs: Unmet Transportation Needs (08/15/2024)   PRAPARE - Transportation    Lack of Transportation (Medical): Yes    Lack of Transportation (Non-Medical): Yes  Physical Activity: Insufficiently Active (02/04/2020)   Received from [facility] visits prior to 12/08/2022.   Exercise Vital Sign    On average, how many days per week do you engage in moderate to strenuous exercise (like a brisk walk)?: 3 days    On average, how many minutes do you engage in exercise at this level?: 30 min  Stress: No Stress Concern Present (02/04/2020)   Received from [facility] visits prior to 12/08/2022.   [employer] of Occupational Health - Occupational Stress Questionnaire    Feeling of Stress : Only a little  Social Connections: Socially Integrated (08/15/2024)   Social Connection and Isolation Panel    Frequency of  Communication with Friends and Family: More than three times a week    Frequency of Social Gatherings with Friends and Family: Once a week    Attends Religious Services: More than 4 times per year    Active Member of [employer] or Organizations: Yes    Attends Engineer, Structural: More than 4 times per year    Marital Status: Married  Catering Manager Violence: At Risk (08/15/2024)   Humiliation, Afraid, Rape, and Kick questionnaire    Fear of Current or Ex-Partner: No    Emotionally Abused: Yes    Physically Abused: No    Sexually Abused: No    Review of Systems: General: Negative for anorexia, weight loss, fever, chills, fatigue, weakness. Eyes: Negative for vision changes.  ENT: Negative for hoarseness, difficulty swallowing , nasal congestion. CV: Negative for chest pain, angina, palpitations, dyspnea on exertion, peripheral edema.  Respiratory: Negative for dyspnea at rest, dyspnea on exertion, cough, sputum, wheezing.  GI: See history of present illness. GU:  Negative for dysuria, hematuria, urinary incontinence, urinary frequency, nocturnal urination.  MS: Negative for joint pain, low back pain.  Derm: Negative for rash or itching.  Neuro: Negative for weakness, abnormal sensation, seizure, frequent headaches, memory loss, confusion.  Psych: Negative for anxiety, depression Endo: Negative for unusual weight change.  Heme: Negative for bruising or bleeding. Allergy: Negative for rash or hives.  Physical Exam: Vital signs in last 24 hours: Temp:  [  97.7 F (36.5 C)-98.9 F (37.2 C)] 98.9 F (37.2 C) (11/09 0511) Pulse Rate:  [81-104] 81 (11/09 0511) Resp:  [16-25] 16 (11/09 0511) BP: (114-138)/(48-106) 114/50 (11/09 0511) SpO2:  [91 %-99 %] 96 % (11/09 0511) Weight:  [72 kg] 72 kg (11/08 1420) Last BM Date : 08/15/24 General:   Alert,  Well-developed, well-nourished, pleasant and cooperative in NAD Head:  Normocephalic and atraumatic. Eyes:  Sclera clear, no  icterus.   Conjunctiva pink. Ears:  Normal auditory acuity. Nose:  No deformity, discharge,  or lesions. Mouth:  No deformity or lesions, dentition normal. Neck:  Supple; no masses or thyromegaly. Lungs:  Clear throughout to auscultation.   No wheezes, crackles, or rhonchi. No acute distress. Heart:  Regular rate and rhythm; no murmurs, clicks, rubs,  or gallops. Abdomen:  Soft, nontender and nondistended. No masses, hepatosplenomegaly or hernias noted. Normal bowel sounds, without guarding, and without rebound.   Msk:  Symmetrical without gross deformities. Normal posture. Pulses:  Normal pulses noted. Extremities:  Without clubbing or edema. Neurologic:  Alert and  oriented x4;  grossly normal neurologically. Skin:  Intact without significant lesions or rashes. Cervical Nodes:  No significant cervical adenopathy. Psych:  Alert and cooperative. Normal mood and affect.  Intake/Output from previous day: 11/08 0701 - 11/09 0700 In: 460 [I.V.:100; Blood:360] Out: 300 [Urine:300] Intake/Output this shift: No intake/output data recorded.  Lab Results: Recent Labs    08/15/24 1518 08/15/24 1923 08/16/24 0144  WBC 14.6*  --  11.8*  HGB 7.0* 5.9* 7.1*  HCT 23.1* 19.6* 22.4*  PLT 378  --  293   BMET Recent Labs    08/15/24 1518 08/16/24 0144  NA 137 138  K 3.8 3.3*  CL 99 105  CO2 22 23  GLUCOSE 205* 102*  BUN 48* 43*  CREATININE 0.70 0.64  CALCIUM  8.8* 8.1*   LFT Recent Labs    08/15/24 1518  PROT 6.2*  ALBUMIN  3.7  AST 18  ALT 12  ALKPHOS 56  BILITOT 0.9   PT/INR Recent Labs    08/15/24 1518  LABPROT 14.2  INR 1.0   Hepatitis Panel No results for input(s): HEPBSAG, HCVAB, HEPAIGM, HEPBIGM in the last 72 hours. C-Diff No results for input(s): CDIFFTOX in the last 72 hours.  Studies/Results: CT Cervical Spine Wo Contrast Result Date: 08/15/2024 EXAM: CT CERVICAL SPINE WITHOUT CONTRAST 08/15/2024 06:03:00 PM TECHNIQUE: CT of the cervical spine  was performed without the administration of intravenous contrast. Multiplanar reformatted images are provided for review. Automated exposure control, iterative reconstruction, and/or weight based adjustment of the mA/kV was utilized to reduce the radiation dose to as low as reasonably achievable. COMPARISON: None available. CLINICAL HISTORY: Polytrauma, blunt. FINDINGS: CERVICAL SPINE: BONES AND ALIGNMENT: ACDF C4 - C7. No acute fracture or traumatic malalignment. DEGENERATIVE CHANGES: Disc space height loss and degenerative endplate changes at C3 - C4 and C7 - T1. Moderate multilevel facet arthropathy. Spinal canal narrowing is greatest at C3 - C4 where it is mild secondary to posterior disc osteophyte complex. SOFT TISSUES: No prevertebral soft tissue swelling. IMPRESSION: 1. No acute abnormality of the cervical spine. Electronically signed by: Norman Gatlin MD 08/15/2024 06:21 PM EST RP Workstation: [id number]   CT Head Wo Contrast Result Date: 08/15/2024 EXAM: CT HEAD WITHOUT CONTRAST 08/15/2024 06:03:00 PM TECHNIQUE: CT of the head was performed without the administration of intravenous contrast. Automated exposure control, iterative reconstruction, and/or weight based adjustment of the mA/kV was utilized to reduce the radiation dose  to as low as reasonably achievable. COMPARISON: None available. CLINICAL HISTORY: Polytrauma, blunt. FINDINGS: BRAIN AND VENTRICLES: No acute hemorrhage. No evidence of acute infarct. Cerebral atrophy. Ventriculomegaly, disproportionate to cerebral volume loss. No extra-axial collection. No mass effect or midline shift. ORBITS: No acute abnormality. SINUSES: No acute abnormality. SOFT TISSUES AND SKULL: No acute soft tissue abnormality. No skull fracture. IMPRESSION: 1. No acute intracranial abnormality. 2. Ventriculomegaly out of proportion to cortical atrophy. This is favored due to central atrophy however normal pressure hydrocephalus could appear similarly. Electronically  signed by: Norman Gatlin MD 08/15/2024 06:18 PM EST RP Workstation: [id number]    Assessment: *Coffee-ground emesis *Acute blood loss anemia due to above *Hiatal hernia *Chronic GERD  Plan: Discussed in depth with patient today. Will proceed with EGD to evaluate for peptic ulcer disease, esophagitis, gastritis, H. Pylori, duodenitis, or other. Will also evaluate for esophageal stricture, Schatzki's ring, esophageal web or other.   The risks including infection, bleed, or perforation as well as benefits, limitations, alternatives and imponderables have been reviewed with the patient. Potential for esophageal dilation, biopsy, etc. have also been reviewed.  Questions have been answered. All parties agreeable.  Continue on IV PPI twice daily.  Keep patient NPO.  Discussed further with anesthesia, will order 1 more unit of PRBCs.  Continue to monitor H&H.  Further recommendations to follow.   Carlin POUR. Cindie, D.O. Gastroenterology and Hepatology [facility]    LOS: 0 days     08/16/2024, 8:57 AM

## 2024-08-16 NOTE — Transfer of Care (Signed)
 Immediate Anesthesia Transfer of Care Note  Patient: Jeanne Kim  Procedure(s) Performed: EGD (ESOPHAGOGASTRODUODENOSCOPY)  Patient Location: PACU  Anesthesia Type:General  Level of Consciousness: sedated  Airway & Oxygen Therapy: Patient Spontanous Breathing and Patient connected to face mask oxygen  Post-op Assessment: Post -op Vital signs reviewed and stable  Post vital signs: stable  Last Vitals:  Vitals Value Taken Time  BP    Temp    Pulse    Resp    SpO2      Last Pain:  Vitals:   08/16/24 1000  TempSrc: Oral  PainSc:          Complications: No notable events documented.

## 2024-08-16 NOTE — Progress Notes (Signed)
 RN reported patient's pain in abdomen 8/10. After EGD she received tramadol  with little improvement, passing a lot of gas. When I come to bedside she's making jokes, says no, not really when I ask if she's in pain. She's in no distress, belly soft, really not tender, nondistended, +BS. Bedside portable XR shows nonobstructive bowel pattern.   Ok to treat symptomatically, but keep close monitoring. Recheck H/H pending, but no gross bleeding noted.   Bernardino Come, MD 08/16/2024 5:52 PM

## 2024-08-16 NOTE — Op Note (Signed)
 [facility] Patient Name: Jeanne Kim Procedure Date: 08/16/2024 9:36 AM MRN: [id number] Date of Birth: 22-May-1941 Attending MD: Carlin POUR. Cindie , [place], [id number] CSN: [id number] Age: 83 Admit Type: Inpatient Procedure:                Upper GI endoscopy Indications:              Acute post hemorrhagic anemia, Coffee-ground emesis Providers:                Carlin POUR. Cindie, DO, Olam Ada, RN, Gordy Bruckner Tech, Technician Referring MD:              Medicines:                See the Anesthesia note for documentation of the                            administered medications Complications:            No immediate complications. Estimated Blood Loss:     Estimated blood loss was minimal. Procedure:                Pre-Anesthesia Assessment:                           - The anesthesia plan was to use monitored                            anesthesia care (MAC).                           After obtaining informed consent, the endoscope was                            passed under direct vision. Throughout the                            procedure, the patient's blood pressure, pulse, and                            oxygen saturations were monitored continuously. The                            [id number] ([id number]) Upper was introduced through                            the mouth, and advanced to the second part of                            duodenum. The upper GI endoscopy was accomplished                            without difficulty. The patient tolerated the                            procedure  well. Scope In: 10:16:14 AM Scope Out: 10:23:24 AM Total Procedure Duration: 0 hours 7 minutes 10 seconds  Findings:      A mild Schatzki ring was found in the distal esophagus.      A large hiatal hernia with multiple Cameron ulcers was found. One of the       ulcers was actively oozing blood. Coagulation for hemostasis using       bipolar probe was successful.       Multiple small sessile polyps (all <1 cm) with no bleeding and no       stigmata of recent bleeding were found in the gastric body.      The duodenal bulb, first portion of the duodenum and second portion of       the duodenum were normal. Impression:               - Mild Schatzki ring.                           - Large hiatal hernia with multiple Cameron ulcers.                            Treated with bipolar cautery.                           - Normal duodenal bulb, first portion of the                            duodenum and second portion of the duodenum.                           - No specimens collected. Moderate Sedation:      Per Anesthesia Care Recommendation:           - Return patient to hospital ward for ongoing care.                           - Soft diet.                           - Continue IV PPI twice daily while inpatient.                            Transition to 40 mg p.o. twice daily upon discharge.                           - Carafate 1g QID                           - Needs to avoid all NSAIDs                           - Transfuse 1 unit PRBCs. Continue to monitor H&H                           - Otherwise supportive care Procedure Code(s):        --- Professional ---  43255, Esophagogastroduodenoscopy, flexible,                            transoral; with control of bleeding, any method Diagnosis Code(s):        --- Professional ---                           K22.2, Esophageal obstruction                           K44.9, Diaphragmatic hernia without obstruction or                            gangrene                           K25.9, Gastric ulcer, unspecified as acute or                            chronic, without hemorrhage or perforation                           D62, Acute posthemorrhagic anemia                           K92.0, Hematemesis CPT copyright 2022 American Medical Association. All rights reserved. The codes documented in this  report are preliminary and upon coder review may  be revised to meet current compliance requirements. Carlin POUR. Cindie, DO Carlin POUR. Cindie, DO 08/16/2024 10:33:19 AM This report has been signed electronically. Number of Addenda: 0

## 2024-08-16 NOTE — Progress Notes (Signed)
 Pt back from endo procedure, awake, A&O. Denies any c/o pain or n/v. Tolerating clear liquids without difficulty. VSS. Aware of pending blood transfusion. Side rails up, call bell within reach and bed alarm on for safety. Advised to call for needs, states understanding.

## 2024-08-16 NOTE — Progress Notes (Signed)
 Patient keeps removing her C-Collar that she is to wear around her neck. Pt states that she doesn't want to wear that thing, its uncomfortable and I don't want it! This writer has replaced it on her twice now and she has already removed it again. NP made aware of pts refusal to wear C Collar. Pt has completed the 1 unit of blood that was ordered to be transfused and tolerated blood transfusion well. No adverse reactions noted. Denies pain. Vital signs stable. Pts daughters called and have been updated on the patients current status.

## 2024-08-17 ENCOUNTER — Telehealth: Payer: Self-pay | Admitting: Gastroenterology

## 2024-08-17 ENCOUNTER — Encounter (HOSPITAL_COMMUNITY): Payer: Self-pay | Admitting: Internal Medicine

## 2024-08-17 DIAGNOSIS — K219 Gastro-esophageal reflux disease without esophagitis: Secondary | ICD-10-CM | POA: Diagnosis present

## 2024-08-17 DIAGNOSIS — E7800 Pure hypercholesterolemia, unspecified: Secondary | ICD-10-CM | POA: Diagnosis present

## 2024-08-17 DIAGNOSIS — K449 Diaphragmatic hernia without obstruction or gangrene: Secondary | ICD-10-CM | POA: Diagnosis present

## 2024-08-17 DIAGNOSIS — D649 Anemia, unspecified: Secondary | ICD-10-CM | POA: Diagnosis not present

## 2024-08-17 DIAGNOSIS — E1165 Type 2 diabetes mellitus with hyperglycemia: Secondary | ICD-10-CM | POA: Diagnosis present

## 2024-08-17 DIAGNOSIS — E876 Hypokalemia: Secondary | ICD-10-CM | POA: Diagnosis present

## 2024-08-17 DIAGNOSIS — F32A Depression, unspecified: Secondary | ICD-10-CM | POA: Diagnosis present

## 2024-08-17 DIAGNOSIS — W1830XA Fall on same level, unspecified, initial encounter: Secondary | ICD-10-CM | POA: Diagnosis present

## 2024-08-17 DIAGNOSIS — K222 Esophageal obstruction: Secondary | ICD-10-CM | POA: Diagnosis present

## 2024-08-17 DIAGNOSIS — I959 Hypotension, unspecified: Secondary | ICD-10-CM | POA: Diagnosis present

## 2024-08-17 DIAGNOSIS — K254 Chronic or unspecified gastric ulcer with hemorrhage: Secondary | ICD-10-CM | POA: Diagnosis present

## 2024-08-17 DIAGNOSIS — Z7982 Long term (current) use of aspirin: Secondary | ICD-10-CM | POA: Diagnosis not present

## 2024-08-17 DIAGNOSIS — G4730 Sleep apnea, unspecified: Secondary | ICD-10-CM | POA: Diagnosis present

## 2024-08-17 DIAGNOSIS — R4189 Other symptoms and signs involving cognitive functions and awareness: Secondary | ICD-10-CM | POA: Diagnosis present

## 2024-08-17 DIAGNOSIS — E1159 Type 2 diabetes mellitus with other circulatory complications: Secondary | ICD-10-CM | POA: Diagnosis present

## 2024-08-17 DIAGNOSIS — Z794 Long term (current) use of insulin: Secondary | ICD-10-CM | POA: Diagnosis not present

## 2024-08-17 DIAGNOSIS — F39 Unspecified mood [affective] disorder: Secondary | ICD-10-CM | POA: Diagnosis present

## 2024-08-17 DIAGNOSIS — E860 Dehydration: Secondary | ICD-10-CM | POA: Diagnosis present

## 2024-08-17 DIAGNOSIS — Z23 Encounter for immunization: Secondary | ICD-10-CM | POA: Diagnosis present

## 2024-08-17 DIAGNOSIS — I152 Hypertension secondary to endocrine disorders: Secondary | ICD-10-CM | POA: Diagnosis present

## 2024-08-17 DIAGNOSIS — K922 Gastrointestinal hemorrhage, unspecified: Secondary | ICD-10-CM | POA: Diagnosis present

## 2024-08-17 DIAGNOSIS — M797 Fibromyalgia: Secondary | ICD-10-CM | POA: Diagnosis present

## 2024-08-17 DIAGNOSIS — I2510 Atherosclerotic heart disease of native coronary artery without angina pectoris: Secondary | ICD-10-CM | POA: Diagnosis present

## 2024-08-17 DIAGNOSIS — G4700 Insomnia, unspecified: Secondary | ICD-10-CM | POA: Diagnosis present

## 2024-08-17 DIAGNOSIS — F419 Anxiety disorder, unspecified: Secondary | ICD-10-CM | POA: Diagnosis present

## 2024-08-17 DIAGNOSIS — D62 Acute posthemorrhagic anemia: Secondary | ICD-10-CM | POA: Diagnosis present

## 2024-08-17 DIAGNOSIS — E890 Postprocedural hypothyroidism: Secondary | ICD-10-CM | POA: Diagnosis present

## 2024-08-17 LAB — CBC
HCT: 29 % — ABNORMAL LOW (ref 36.0–46.0)
Hemoglobin: 9.4 g/dL — ABNORMAL LOW (ref 12.0–15.0)
MCH: 28.8 pg (ref 26.0–34.0)
MCHC: 32.4 g/dL (ref 30.0–36.0)
MCV: 89 fL (ref 80.0–100.0)
Platelets: 230 K/uL (ref 150–400)
RBC: 3.26 MIL/uL — ABNORMAL LOW (ref 3.87–5.11)
RDW: 17.4 % — ABNORMAL HIGH (ref 11.5–15.5)
WBC: 9 K/uL (ref 4.0–10.5)
nRBC: 0 % (ref 0.0–0.2)

## 2024-08-17 LAB — TYPE AND SCREEN
ABO/RH(D): O NEG
Antibody Screen: NEGATIVE
Unit division: 0
Unit division: 0
Unit division: 0

## 2024-08-17 LAB — BPAM RBC
Blood Product Expiration Date: 202512012359
Blood Product Expiration Date: 202512042359
Blood Product Expiration Date: 202512102359
ISSUE DATE / TIME: 202511082007
ISSUE DATE / TIME: 202511091139
ISSUE DATE / TIME: 202511092301
Unit Type and Rh: 202512012359
Unit Type and Rh: 9500
Unit Type and Rh: 9500
Unit Type and Rh: 9500

## 2024-08-17 LAB — BASIC METABOLIC PANEL WITH GFR
Anion gap: 7 (ref 5–15)
BUN: 18 mg/dL (ref 8–23)
CO2: 26 mmol/L (ref 22–32)
Calcium: 8 mg/dL — ABNORMAL LOW (ref 8.9–10.3)
Chloride: 106 mmol/L (ref 98–111)
Creatinine, Ser: 0.69 mg/dL (ref 0.44–1.00)
GFR, Estimated: >60 mL/min (ref >60)
Glucose, Bld: 94 mg/dL (ref 70–99)
Potassium: 3.4 mmol/L — ABNORMAL LOW (ref 3.5–5.1)
Sodium: 139 mmol/L (ref 135–145)

## 2024-08-17 LAB — GLUCOSE, CAPILLARY
Glucose-Capillary: 91 mg/dL (ref 70–99)
Glucose-Capillary: 106 mg/dL — ABNORMAL HIGH (ref 70–99)
Glucose-Capillary: 96 mg/dL (ref 70–99)
Glucose-Capillary: 161 mg/dL — ABNORMAL HIGH (ref 70–99)
Glucose-Capillary: 155 mg/dL — ABNORMAL HIGH (ref 70–99)

## 2024-08-17 MED ORDER — POTASSIUM CHLORIDE CRYS ER 20 MEQ PO TBCR
40.0000 meq | EXTENDED_RELEASE_TABLET | Freq: Once | ORAL | Status: AC
  Administered 2024-08-17: 40 meq via ORAL
  Filled 2024-08-17: qty 2

## 2024-08-17 MED ORDER — DIPHENHYDRAMINE HCL 25 MG PO CAPS
25.0000 mg | ORAL_CAPSULE | Freq: Four times a day (QID) | ORAL | Status: DC | PRN
Start: 2024-08-17 — End: 2024-08-18
  Administered 2024-08-17 – 2024-08-18 (×2): 25 mg via ORAL
  Filled 2024-08-17 (×2): qty 1

## 2024-08-17 NOTE — Progress Notes (Signed)
 [facility] PROGRESS NOTE  DESIREA MIZRAHI (DOB: 1941/09/20) FMW:[id number] PCP: Orpha Yancey LABOR, MD  Brief Narrative: RUBY LOGIUDICE is an 83 y.o. female with medical history significant for gastric ulcer and anemia, coronary artery disease, diabetes mellitus, hypothyroidism, cognitive impairment. Patient's was brought to the ED after she was found down by her husband at home, there was reports of altered mental status.  On my evaluation, patient is awake alert oriented and able to answer questions, except she cannot remember or tell me why she was on the floor.  Per ED provider it was unknown how long patient had been in the floor, and on arrival to the ED, patient was covered in fecal matter, with dried black vomitus on her mouth..  Patient tells me she fell on the floor today.  She lives at home with her husband who is older than her.  She had 2 episodes of vomiting today-coffee grounds.  She reports NSAID use-ibuprofen  2-3 times a week. She denies black stools.  No blood in stools.  No difficulty breathing no chest pain.   ED Course: Temperature 98.6.  Heart rate 96-104.  Respiratory rate 18-25.  Blood pressure systolic 124-138.  O2 sat 91 to 99% on room air. Hemoglobin 7.  Troponin less than 15.  Lactic acid 3.7. CK 57. WBC 14.6. 1 L bolus given.  1 units PRBC transfused.  EDP talked with Dr. Cindie, keep n.p.o., will see in consult in AM.  Subjective: In chair doing word search, has no complaints but does generally feel like I can't get into gear. Tolerating po just fine.   Objective: BP (!) 128/56 (BP Location: Right Arm)   Pulse 85   Temp 98.3 F (36.8 C) (Oral)   Resp 16   Ht 5' 5 (1.651 m)   Wt 72 kg   SpO2 99%   BMI 26.41 kg/m   Gen: Pale elderly female in no distress Pulm: Clear, nonlabored  CV: RRR, no MRG GI: Soft, NT, ND, +BS Neuro: Alert and oriented. No new focal deficits. Ext: Warm, no deformities. Skin: No rashes, lesions or ulcers on visualized skin    EGD 11/9 Dr. Cindie: Impression:        - Mild Schatzki ring.                           - Large hiatal hernia with multiple Cameron ulcers.                            Treated with bipolar cautery.                           - Normal duodenal bulb, first portion of the                            duodenum and second portion of the duodenum.                           - No specimens collected.  Recommendation:           - Return patient to hospital ward for ongoing care.                           - Soft diet.                           -  Continue IV PPI twice daily while inpatient.                            Transition to 40 mg p.o. twice daily upon discharge.                           - Carafate 1g QID                           - Needs to avoid all NSAIDs                           - Transfuse 1 unit PRBCs. Continue to monitor H&H                           - Otherwise supportive care  Assessment & Plan: Acute blood loss anemia due to upper GI bleeding due to [facility] ulcer: s/p Tx at EGD 11/9.  - Hgb up s/p 1u RBCs 11/8 and 1u RBCs 11/9. BUN declining - Avoid all NSAIDs - Restart aspirin  81mg  and monitor - Continue PPI IV BID, add carafate 1g QID   Fall: Found down with unclear circumstances, but was severely anemic. Some confusion that has improved since arrival to ED.  - PT eval today to inform mobility/disposition.  - Fall precautions   Hypotension, hx HTN:   - Hold antihypertensives for now, continuing very low dose midodrine  for now. Will adjust home medications based on BP trends    T2DM:  - Hold home oral medications - Continue SSI, holding basal insulin  (20u) and CBGs are at inpatient goal.   Hypothyroidism - Continue synthroid    Mood disorder, insomnia:  - Hold sedating medications, continue qHS doxepin   HLD:  - Can restart statin at discharge  Hypokalemia:  - Supplement and monitor  Bernardino KATHEE Come, MD [facility] [email or web address] 08/17/2024, 1:24 PM

## 2024-08-17 NOTE — Plan of Care (Signed)

## 2024-08-17 NOTE — Plan of Care (Signed)
  Problem: Acute Rehab PT Goals(only PT should resolve) Goal: Pt Will Go Supine/Side To Sit Outcome: Progressing Flowsheets (Taken 08/17/2024 1404) Pt will go Supine/Side to Sit:  with modified independence  with supervision Goal: Patient Will Transfer Sit To/From Stand Outcome: Progressing Flowsheets (Taken 08/17/2024 1404) Patient will transfer sit to/from stand:  with modified independence  with supervision Goal: Pt Will Transfer Bed To Chair/Chair To Bed Outcome: Progressing Flowsheets (Taken 08/17/2024 1404) Pt will Transfer Bed to Chair/Chair to Bed:  with modified independence  with supervision Goal: Pt Will Ambulate Outcome: Progressing Flowsheets (Taken 08/17/2024 1404) Pt will Ambulate:  100 feet  with supervision  with modified independence   2:05 PM, 08/17/24 Lynwood Music, MPT Physical Therapist with [facility] [phone] office [phone] mobile phone

## 2024-08-17 NOTE — Progress Notes (Addendum)
 08/17/2024 11:15 AM ----------------------------------------------Patient assessed by the [facility]------------------------------------   Chart reviewed:Yes   Documentation gaps: none   Labs, test, and orders reviewed: yes  30-day Readmission: No  Discharge order: No, EDD currently listed for tomorrow 08/18/2024. Pt. Scheduled for 1030 am discharge at this time.  Current discharge plan: Home with family support, pending PT/OT evaluations today.    Barrier to discharge before 11am: Potential need for [facility] or DME arrangements based on PT/OT evaluations today.    Intervention provided by [facility] team: None at this time. CCC. Following for patient progression.   Barrier resolved: Yes   Acire Tang, RN [employer]

## 2024-08-17 NOTE — Telephone Encounter (Signed)
 Jeanne Kim:  Please help facilitate hospital follow-up for patient with [facility]. Established with Dr Shila.

## 2024-08-17 NOTE — TOC Progression Note (Signed)
 Transition of Care [facility]) - Progression Note    Patient Details  Name: Jeanne Kim MRN: [id number] Date of Birth: 12/01/1940  Transition of Care [facility]) CM/SW Contact  [name], [place] Phone Number: 08/17/2024, 3:21 PM  Clinical Narrative:     CSW spoke with patient at bedside regarding PT recommendation for HHPT. Patient stated that she does not recall PT coming to work with her today, but she would speak with her husband about it and make sure it is fine for someone to come to the house to do HHPT. Patient asked for CSW to follow up with her in the morning. CSW will continue to follow.  Expected Discharge Plan: Home w Home Health Services Barriers to Discharge: Continued Medical Work up   Expected Discharge Plan and Services    Home with [facility], if patient agrees Social Drivers of Health (SDOH) Interventions SDOH Screenings   Food Insecurity: Food Insecurity Present (08/15/2024)  Housing: Low Risk  (08/15/2024)  Transportation Needs: Unmet Transportation Needs (08/15/2024)  Utilities: Not At Risk (08/15/2024)  Financial Resource Strain: Low Risk (10/19/2022)   Received from [facility]  Physical Activity: Insufficiently Active (02/04/2020)   Received from [facility] visits prior to 12/08/2022.  Social Connections: Socially Integrated (08/15/2024)  Stress: No Stress Concern Present (02/04/2020)   Received from [facility] visits prior to 12/08/2022.  Tobacco Use: Low Risk  (08/15/2024)    Readmission Risk Interventions    09/13/2022    1:16 PM 05/16/2022   11:25 AM  Readmission Risk Prevention Plan  Transportation Screening Complete Complete  HRI or Home Care Consult Complete   Social Work Consult for Recovery Care Planning/Counseling Complete   Palliative Care Screening Not Applicable   Medication Review Oceanographer) Complete Complete  HRI or Home Care Consult  Complete  SW Recovery Care/Counseling Consult  Complete   Palliative Care Screening  Not Applicable  Skilled Nursing Facility  Not Applicable

## 2024-08-17 NOTE — Progress Notes (Addendum)
 Gastroenterology Progress Note    Primary GI: [facility] Patient ID: Jeanne Kim; [id number]; 1941/04/11    Subjective   Dysphagia improved. Ate some crackers this morning. No overt GI bleeding. Sitting up in chair.   Objective   Vital signs in last 24 hours Temp:  [97.8 F (36.6 C)-99 F (37.2 C)] 98.1 F (36.7 C) (11/10 0421) Pulse Rate:  [76-88] 78 (11/10 0421) Resp:  [16-20] 18 (11/10 0421) BP: (96-130)/(45-75) 122/52 (11/10 0421) SpO2:  [92 %-100 %] 97 % (11/10 0421) Last BM Date : 08/15/24  Physical Exam General:   Alert and oriented, pleasant Head:  Normocephalic and atraumatic. Abdomen:  Bowel sounds present, soft, non-tender, non-distended. Limited exam as sitting up in chair Neurologic:  Alert and  oriented x4   Intake/Output from previous day: 11/09 0701 - 11/10 0700 In: 1639 [P.O.:460; I.V.:400; Blood:779] Out: 520 [Urine:520] Intake/Output this shift: No intake/output data recorded.  Lab Results  Recent Labs    08/15/24 1518 08/15/24 1923 08/16/24 0144 08/16/24 1837 08/17/24 0453  WBC 14.6*  --  11.8*  --  9.0  HGB 7.0*   < > 7.1* 7.7* 9.4*  HCT 23.1*   < > 22.4* 23.2* 29.0*  PLT 378  --  293  --  230   < > = values in this interval not displayed.   BMET Recent Labs    08/15/24 1518 08/16/24 0144 08/17/24 0453  NA 137 138 139  K 3.8 3.3* 3.4*  CL 99 105 106  CO2 22 23 26   GLUCOSE 205* 102* 94  BUN 48* 43* 18  CREATININE 0.70 0.64 0.69  CALCIUM  8.8* 8.1* 8.0*   LFT Recent Labs    08/15/24 1518  PROT 6.2*  ALBUMIN  3.7  AST 18  ALT 12  ALKPHOS 56  BILITOT 0.9   PT/INR Recent Labs    08/15/24 1518  LABPROT 14.2  INR 1.0    Studies/Results DG Abd Portable 1V Result Date: 08/16/2024 EXAM: 1 VIEW XRAY OF THE ABDOMEN 08/16/2024 05:52:00 PM COMPARISON: None available. CLINICAL HISTORY: Abdominal pain. FINDINGS: BOWEL: Nonobstructive bowel gas pattern. Stool burden is average. SOFT TISSUES: No opaque urinary calculi. BONES:  Left hip arthroplasty present. IMPRESSION: 1. No acute findings. Electronically signed by: Greig Pique MD 08/16/2024 06:22 PM EST RP Workstation: [id number]   CT Cervical Spine Wo Contrast Result Date: 08/15/2024 EXAM: CT CERVICAL SPINE WITHOUT CONTRAST 08/15/2024 06:03:00 PM TECHNIQUE: CT of the cervical spine was performed without the administration of intravenous contrast. Multiplanar reformatted images are provided for review. Automated exposure control, iterative reconstruction, and/or weight based adjustment of the mA/kV was utilized to reduce the radiation dose to as low as reasonably achievable. COMPARISON: None available. CLINICAL HISTORY: Polytrauma, blunt. FINDINGS: CERVICAL SPINE: BONES AND ALIGNMENT: ACDF C4 - C7. No acute fracture or traumatic malalignment. DEGENERATIVE CHANGES: Disc space height loss and degenerative endplate changes at C3 - C4 and C7 - T1. Moderate multilevel facet arthropathy. Spinal canal narrowing is greatest at C3 - C4 where it is mild secondary to posterior disc osteophyte complex. SOFT TISSUES: No prevertebral soft tissue swelling. IMPRESSION: 1. No acute abnormality of the cervical spine. Electronically signed by: Norman Gatlin MD 08/15/2024 06:21 PM EST RP Workstation: [id number]   CT Head Wo Contrast Result Date: 08/15/2024 EXAM: CT HEAD WITHOUT CONTRAST 08/15/2024 06:03:00 PM TECHNIQUE: CT of the head was performed without the administration of intravenous contrast. Automated exposure control, iterative reconstruction, and/or weight based adjustment of the mA/kV was utilized to  reduce the radiation dose to as low as reasonably achievable. COMPARISON: None available. CLINICAL HISTORY: Polytrauma, blunt. FINDINGS: BRAIN AND VENTRICLES: No acute hemorrhage. No evidence of acute infarct. Cerebral atrophy. Ventriculomegaly, disproportionate to cerebral volume loss. No extra-axial collection. No mass effect or midline shift. ORBITS: No acute abnormality. SINUSES: No acute  abnormality. SOFT TISSUES AND SKULL: No acute soft tissue abnormality. No skull fracture. IMPRESSION: 1. No acute intracranial abnormality. 2. Ventriculomegaly out of proportion to cortical atrophy. This is favored due to central atrophy however normal pressure hydrocephalus could appear similarly. Electronically signed by: Norman Gatlin MD 08/15/2024 06:18 PM EST RP Workstation: [id number]    Assessment  83 y.o. female with a history of diabetes, breast cancer, hypertension, fibromyalgia, IBS, CAD, hypothyroidism, prolonged QT interval, presenting this admission with hematemesis and acute blood loss anemia.  GI consulted and performed EGD with findings of mild Schatzki ring, large hiatal hernia with multiple cameron ulcers s/p bipolar cautery as source for acute blood loss anemia and hematemesis, and normal duodenum. Cameron ulcers exacerbated by frequent NSAID use.   Hgb 7.0 on admission, down to 5.9, receiving total of 3 units PRBCs and improvement in Hgb today to 9.4 from low 7 range. No overt GI bleeding.  Clinically improved. GI will sign off and arrange outpatient follow-up.      Plan / Recommendations  BID PPI Course of carate QID: continue at discharge an additional week  Avoidance of all NSAIDs Soft diet GI signing off and will arrange outpatient follow-up.     LOS: 0 days    08/17/2024, 9:34 AM  Jeanne MICAEL Stager, PhD, [facility] Gastroenterology

## 2024-08-17 NOTE — Evaluation (Signed)
 Physical Therapy Evaluation Patient Details Name: Jeanne Kim MRN: [id number] DOB: 1941-05-01 Today's Date: 08/17/2024  History of Present Illness  Jeanne Kim is a 83 y.o. female with medical history significant for gastric ulcer and anemia, coronary artery disease, diabetes mellitus, hypothyroidism, cognitive impairment.  Patient's was brought to the ED after she was found down by her husband at home, there was reports of altered mental status.  On my evaluation, patient is awake alert oriented and able to answer questions, except she cannot remember or tell me why she was on the floor.  Per ED provider it was unknown how long patient had been in the floor, and on arrival to the ED, patient was covered in fecal matter, with dried black vomitus on her mouth..  Patient tells me she fell on the floor today.  She lives at home with her husband who is older than her.  She had 2 episodes of vomiting today-coffee grounds.  She reports NSAID use-ibuprofen  2-3 times a week.  She denies black stools.  No blood in stools.  No difficulty breathing no chest pain.   Clinical Impression  Patient demonstrates slightly labored movement for sitting up at bedside and fair/good return for ambulating in room, hallway using RW without loss of balance and tolerated sitting up in chair after therapy. Patient will benefit from continued skilled physical therapy in hospital and recommended venue below to increase strength, balance, endurance for safe ADLs and gait.         If plan is discharge home, recommend the following: A little help with walking and/or transfers;A little help with bathing/dressing/bathroom;Help with stairs or ramp for entrance;Assist for transportation;Assistance with cooking/housework   Can travel by private vehicle        Equipment Recommendations None recommended by PT  Recommendations for Other Services       Functional Status Assessment Patient has had a recent decline in their  functional status and demonstrates the ability to make significant improvements in function in a reasonable and predictable amount of time.     Precautions / Restrictions Precautions Precautions: Fall Recall of Precautions/Restrictions: Intact Restrictions Weight Bearing Restrictions Per Provider Order: No      Mobility  Bed Mobility Overal bed mobility: Needs Assistance Bed Mobility: Supine to Sit     Supine to sit: Supervision, Contact guard     General bed mobility comments: slightly labored movement    Transfers Overall transfer level: Needs assistance Equipment used: Rolling walker (2 wheels) Transfers: Sit to/from Stand, Bed to chair/wheelchair/BSC Sit to Stand: Supervision, Contact guard assist   Step pivot transfers: Supervision, Contact guard assist       General transfer comment: had mild difficulty transferring to/from commode in bathroom    Ambulation/Gait Ambulation/Gait assistance: Supervision, Contact guard assist Gait Distance (Feet): 65 Feet Assistive device: Rolling walker (2 wheels) Gait Pattern/deviations: Decreased step length - right, Decreased step length - left, Decreased stride length, Trunk flexed Gait velocity: decreased     General Gait Details: slightly labored movement with fair/good return for ambulating in room, hallway without loss of balance  Stairs            Wheelchair Mobility     Tilt Bed    Modified Rankin (Stroke Patients Only)       Balance Overall balance assessment: Needs assistance Sitting-balance support: Feet supported, No upper extremity supported Sitting balance-Leahy Scale: Good Sitting balance - Comments: seated at EOB   Standing balance support: During functional activity, No  upper extremity supported Standing balance-Leahy Scale: Poor Standing balance comment: fair/poor without AD, fair/good using RW                             Pertinent Vitals/Pain Pain Assessment Pain  Assessment: No/denies pain    Home Living Family/patient expects to be discharged to:: Assisted living                 Home Equipment: Agricultural Consultant (2 wheels)      Prior Function Prior Level of Function : Needs assist             Mobility Comments: Household ambulation with RW PRN ADLs Comments: Assisted by ALF staff     Extremity/Trunk Assessment   Upper Extremity Assessment Upper Extremity Assessment: Overall WFL for tasks assessed    Lower Extremity Assessment Lower Extremity Assessment: Generalized weakness    Cervical / Trunk Assessment Cervical / Trunk Assessment: Kyphotic  Communication   Communication Communication: No apparent difficulties    Cognition Arousal: Alert Behavior During Therapy: WFL for tasks assessed/performed   PT - Cognitive impairments: History of cognitive impairments                         Following commands: Intact       Cueing       General Comments      Exercises     Assessment/Plan    PT Assessment Patient needs continued PT services  PT Problem List Decreased strength;Decreased activity tolerance;Decreased balance;Decreased mobility       PT Treatment Interventions DME instruction;Gait training;Stair training;Functional mobility training;Therapeutic activities;Therapeutic exercise;Patient/family education    PT Goals (Current goals can be found in the Care Plan section)  Acute Rehab PT Goals Patient Stated Goal: return home PT Goal Formulation: With patient Time For Goal Achievement: 08/21/24 Potential to Achieve Goals: Good    Frequency Min 3X/week     Co-evaluation               AM-PAC PT 6 Clicks Mobility  Outcome Measure Help needed turning from your back to your side while in a flat bed without using bedrails?: None Help needed moving from lying on your back to sitting on the side of a flat bed without using bedrails?: A Little Help needed moving to and from a bed to a  chair (including a wheelchair)?: A Little Help needed standing up from a chair using your arms (e.g., wheelchair or bedside chair)?: A Little Help needed to walk in hospital room?: A Little Help needed climbing 3-5 steps with a railing? : A Lot 6 Click Score: 18    End of Session   Activity Tolerance: Patient tolerated treatment well;Patient limited by fatigue Patient left: in chair;with call bell/phone within reach;with chair alarm set Nurse Communication: Mobility status PT Visit Diagnosis: Unsteadiness on feet (R26.81);Other abnormalities of gait and mobility (R26.89);Muscle weakness (generalized) (M62.81)    Time: 8895-8865 PT Time Calculation (min) (ACUTE ONLY): 30 min   Charges:   PT Evaluation $PT Eval Moderate Complexity: 1 Mod PT Treatments $Therapeutic Activity: 23-37 mins PT General Charges $$ ACUTE PT VISIT: 1 Visit         2:02 PM, 08/17/24 Lynwood Music, MPT Physical Therapist with [facility] 336 [phone] office [phone] mobile phone

## 2024-08-18 DIAGNOSIS — D649 Anemia, unspecified: Secondary | ICD-10-CM | POA: Diagnosis not present

## 2024-08-18 LAB — GLUCOSE, CAPILLARY
Glucose-Capillary: 147 mg/dL — ABNORMAL HIGH (ref 70–99)
Glucose-Capillary: 162 mg/dL — ABNORMAL HIGH (ref 70–99)
Glucose-Capillary: 198 mg/dL — ABNORMAL HIGH (ref 70–99)
Glucose-Capillary: 253 mg/dL — ABNORMAL HIGH (ref 70–99)

## 2024-08-18 LAB — URINE CULTURE: Culture: >=100000 — AB

## 2024-08-18 MED ORDER — SUCRALFATE 1 GM/10ML PO SUSP: 1.0000 g | Freq: Three times a day (TID) | ORAL | 0 refills | Status: AC

## 2024-08-18 MED ORDER — PANTOPRAZOLE SODIUM 40 MG PO TBEC: 40.0000 mg | DELAYED_RELEASE_TABLET | Freq: Two times a day (BID) | ORAL | 0 refills | Status: DC

## 2024-08-18 NOTE — Progress Notes (Signed)
 Mobility Specialist Progress Note:    08/18/24 1217  Mobility  Activity Pivoted/transferred from bed to chair  Level of Assistance Minimal assist, patient does 75% or more  Assistive Device Front wheel walker  Distance Ambulated (ft) 4 ft  Range of Motion/Exercises Active;All extremities  Activity Response Tolerated well  Mobility Referral Yes  Mobility visit 1 Mobility  Mobility Specialist Start Time (ACUTE ONLY) 1217  Mobility Specialist Stop Time (ACUTE ONLY) 1234  Mobility Specialist Time Calculation (min) (ACUTE ONLY) 17 min   Pt received in bed, agreeable to mobility. Required MinA to stand and transfer with RW. Tolerated well, asx throughout. NT in room, all needs met.  Khyler Eschmann Mobility Specialist Please contact via Special Educational Needs Teacher or  Rehab office at [phone]

## 2024-08-18 NOTE — Plan of Care (Signed)
 ?  Problem: Education: ?Goal: Knowledge of General Education information will improve ?Description: Including pain rating scale, medication(s)/side effects and non-pharmacologic comfort measures ?Outcome: Progressing ?  ?Problem: Health Behavior/Discharge Planning: ?Goal: Ability to manage health-related needs will improve ?Outcome: Progressing ?  ?Problem: Coping: ?Goal: Level of anxiety will decrease ?Outcome: Progressing ?  ?

## 2024-08-18 NOTE — Discharge Summary (Signed)
 Physician Discharge Summary   Patient: Jeanne Kim MRN: [id number] DOB: 1940/10/10  Admit date:     08/15/2024  Discharge date: 08/18/24  Discharge Physician: Bernardino KATHEE Come   PCP: Orpha Yancey LABOR, MD   Recommendations at discharge:  Follow up with PCP in 1-2 weeks. Recheck CBC at follow up. Follow up with GI after discharge.  Discharge Diagnoses: Principal Problem:   Acute on chronic anemia Active Problems:   Hypothyroidism   Essential hypertension   GI bleed   Hypertension associated with type 2 diabetes mellitus (HCC)   Upper GI bleeding  Hospital Course: Jeanne Kim is an 83 y.o. female with medical history significant for gastric ulcer and anemia, coronary artery disease, diabetes mellitus, hypothyroidism, cognitive impairment. Patient's was brought to the ED after she was found down by her husband at home, there was reports of altered mental status.  On my evaluation, patient is awake alert oriented and able to answer questions, except she cannot remember or tell me why she was on the floor.  Per ED provider it was unknown how long patient had been in the floor, and on arrival to the ED, patient was covered in fecal matter, with dried black vomitus on her mouth..  Patient tells me she fell on the floor today.  She lives at home with her husband who is older than her.  She had 2 episodes of vomiting today-coffee grounds.  She reports NSAID use-ibuprofen  2-3 times a week. She denies black stools.  No blood in stools.  No difficulty breathing no chest pain.   ED Course: Temperature 98.6.  Heart rate 96-104.  Respiratory rate 18-25.  Blood pressure systolic 124-138.  O2 sat 91 to 99% on room air. Hemoglobin 7.  Troponin less than 15.  Lactic acid 3.7. CK 57. WBC 14.6. 1 L bolus given.  1 units PRBC transfused.  EDP talked with Dr. Cindie, keep n.p.o., will see in consult in AM.  EGD 11/9 Dr. Cindie: Impression:        - Mild Schatzki ring.                           - Large  hiatal hernia with multiple Cameron ulcers.                            Treated with bipolar cautery.                           - Normal duodenal bulb, first portion of the                            duodenum and second portion of the duodenum.                           - No specimens collected.   Recommendation:           - Return patient to hospital ward for ongoing care.                           - Soft diet.                           - Continue IV PPI twice daily  while inpatient.                            Transition to 40 mg p.o. twice daily upon discharge.                           - Carafate 1g QID                           - Needs to avoid all NSAIDs                           - Transfuse 1 unit PRBCs. Continue to monitor H&H                           - Otherwise supportive care   Assessment & Plan: Acute blood loss anemia due to upper GI bleeding due to [facility] ulcer: s/p Tx at EGD 11/9.  - Hgb up s/p 1u RBCs 11/8 and 1u RBCs 11/9. BUN declining - Avoid all NSAIDs - Restarted aspirin  81mg , no subsequent bleeding. - Continue PPI BID, add carafate 1g QID   Fall: Found down with unclear circumstances, but was severely anemic. Some confusion that has improved since arrival to ED.  - PT eval confirms safe to return home, HHPT ordered.    Hypotension, hx HTN:   - Hold home oral medications at discharge. BP low-normal without norvasc , ramipril , metoprolol . We will continue midodrine   T2DM:  - Continue outpt Tx's   Hypothyroidism - Continue synthroid    Mood disorder, insomnia:  - Continue outpt Tx's   HLD:  - Can restart statin at discharge   Hypokalemia:  - Supplemented, suggest recheck at follow up   Consultants: GI Procedures performed: EGD  Disposition: Home Diet recommendation:  Cardiac and Carb modified diet DISCHARGE MEDICATION: Allergies as of 08/18/2024       Reactions   Codeine Hives   Sulfonamide Derivatives Hives   Canagliflozin    Penicillins Hives    Pt states she does NOT have allergy to penicillin   Sulfa Antibiotics Other (See Comments)   unknown        Medication List     STOP taking these medications    amLODipine  2.5 MG tablet Commonly known as: NORVASC    metoprolol  succinate 25 MG 24 hr tablet Commonly known as: TOPROL -XL   ramipril  5 MG capsule Commonly known as: ALTACE        TAKE these medications    Aspirin  Low Dose 81 MG tablet Generic drug: aspirin  EC Take 81 mg by mouth every morning.   atorvastatin  20 MG tablet Commonly known as: LIPITOR Take 20 mg by mouth every evening.   doxepin  10 MG capsule Commonly known as: SINEQUAN  Take 10 mg by mouth at bedtime.   DULoxetine  60 MG capsule Commonly known as: CYMBALTA  Take 60 mg by mouth daily.   EPINEPHrine  0.3 mg/0.3 mL Soaj injection Commonly known as: EPI-PEN Inject 0.3 mg into the muscle daily as needed for anaphylaxis.   FeroSul 325 (65 Fe) MG tablet Generic drug: ferrous sulfate  Take 1 tablet (325 mg total) by mouth daily with breakfast.   furosemide  20 MG tablet Commonly known as: LASIX  Take 1 tablet (20 mg total) by mouth daily.   Jardiance  25 MG Tabs tablet Generic drug: empagliflozin  Take 1 tablet (  25 mg total) by mouth daily.   levothyroxine  100 MCG tablet Commonly known as: SYNTHROID  Take 100 mcg by mouth daily before breakfast.   levothyroxine  125 MCG tablet Commonly known as: SYNTHROID  Take 1 tablet (125 mcg total) by mouth daily at 6 (six) AM.   metFORMIN 1000 MG tablet Commonly known as: GLUCOPHAGE Take 1,000 mg by mouth 2 (two) times daily.   midodrine  2.5 MG tablet Commonly known as: PROAMATINE  Take 2.5 mg by mouth 3 (three) times daily.   pantoprazole  40 MG tablet Commonly known as: PROTONIX  Take 1 tablet (40 mg total) by mouth 2 (two) times daily. What changed: when to take this   pioglitazone 45 MG tablet Commonly known as: ACTOS Take 45 mg by mouth daily.   polyethylene glycol powder 17 GM/SCOOP  powder Commonly known as: GLYCOLAX /MIRALAX  Take 17 g by mouth daily.   potassium chloride  SA 20 MEQ tablet Commonly known as: KLOR-CON  M Take 1 tablet (20 mEq total) by mouth daily.   Soliqua  100-33 UNT-MCG/ML Sopn Generic drug: Insulin  Glargine-Lixisenatide  Inject 20 Units into the skin daily. Sliding Scale   sucralfate 1 GM/10ML suspension Commonly known as: CARAFATE Take 10 mLs (1 g total) by mouth 4 (four) times daily -  with meals and at bedtime for 14 days.        Contact information for follow-up providers     Orpha Yancey LABOR, MD Follow up.   Specialty: Internal Medicine Contact information: [address] [place] [address] [phone]         Shila Gustav GAILS, MD Follow up.   Specialty: Gastroenterology Contact information: [address] [place] [address] [phone]              Contact information for after-discharge care     Home Medical Care     [facility]) .   Service: Home Health Services Contact information: [address] [place]  [address] [phone]                    Discharge Exam: Fredricka Weights   08/15/24 1420  Weight: 72 kg  BP (!) 118/54 (BP Location: Right Arm)   Pulse 81   Temp 97.9 F (36.6 C) (Axillary)   Resp 20   Ht 5' 5 (1.651 m)   Wt 72 kg   SpO2 95%   BMI 26.41 kg/m   Well-appearing elderly female in no distress Clear, nonlabored RRR, no MRG Soft, NT, ND, +BS  Condition at discharge: stable  The results of significant diagnostics from this hospitalization (including imaging, microbiology, ancillary and laboratory) are listed below for reference.   Imaging Studies: DG Abd Portable 1V Result Date: 08/16/2024 EXAM: 1 VIEW XRAY OF THE ABDOMEN 08/16/2024 05:52:00 PM COMPARISON: None available. CLINICAL HISTORY: Abdominal pain. FINDINGS: BOWEL: Nonobstructive bowel gas pattern. Stool burden is average. SOFT TISSUES:  No opaque urinary calculi. BONES: Left hip arthroplasty present. IMPRESSION: 1. No acute findings. Electronically signed by: Greig Pique MD 08/16/2024 06:22 PM EST RP Workstation: [id number]   CT Cervical Spine Wo Contrast Result Date: 08/15/2024 EXAM: CT CERVICAL SPINE WITHOUT CONTRAST 08/15/2024 06:03:00 PM TECHNIQUE: CT of the cervical spine was performed without the administration of intravenous contrast. Multiplanar reformatted images are provided for review. Automated exposure control, iterative reconstruction, and/or weight based adjustment of the mA/kV was utilized to reduce the radiation dose to as low as reasonably achievable. COMPARISON: None available. CLINICAL HISTORY: Polytrauma, blunt.  FINDINGS: CERVICAL SPINE: BONES AND ALIGNMENT: ACDF C4 - C7. No acute fracture or traumatic malalignment. DEGENERATIVE CHANGES: Disc space height loss and degenerative endplate changes at C3 - C4 and C7 - T1. Moderate multilevel facet arthropathy. Spinal canal narrowing is greatest at C3 - C4 where it is mild secondary to posterior disc osteophyte complex. SOFT TISSUES: No prevertebral soft tissue swelling. IMPRESSION: 1. No acute abnormality of the cervical spine. Electronically signed by: Norman Gatlin MD 08/15/2024 06:21 PM EST RP Workstation: [id number]   CT Head Wo Contrast Result Date: 08/15/2024 EXAM: CT HEAD WITHOUT CONTRAST 08/15/2024 06:03:00 PM TECHNIQUE: CT of the head was performed without the administration of intravenous contrast. Automated exposure control, iterative reconstruction, and/or weight based adjustment of the mA/kV was utilized to reduce the radiation dose to as low as reasonably achievable. COMPARISON: None available. CLINICAL HISTORY: Polytrauma, blunt. FINDINGS: BRAIN AND VENTRICLES: No acute hemorrhage. No evidence of acute infarct. Cerebral atrophy. Ventriculomegaly, disproportionate to cerebral volume loss. No extra-axial collection. No mass effect or midline shift. ORBITS: No  acute abnormality. SINUSES: No acute abnormality. SOFT TISSUES AND SKULL: No acute soft tissue abnormality. No skull fracture. IMPRESSION: 1. No acute intracranial abnormality. 2. Ventriculomegaly out of proportion to cortical atrophy. This is favored due to central atrophy however normal pressure hydrocephalus could appear similarly. Electronically signed by: Norman Gatlin MD 08/15/2024 06:18 PM EST RP Workstation: [id number]    Microbiology: Results for orders placed or performed during the hospital encounter of 08/15/24  Urine Culture     Status: Abnormal   Collection Time: 08/15/24  2:20 PM   Specimen: Urine, Random  Result Value Ref Range Status   Specimen Description   Final    URINE, RANDOM Performed at [facility], [address], [place], [place] [address]    Special Requests   Final    NONE Reflexed from [id number] Performed at [facility], [address], [place], [place] [address]    Culture >=100,000 COLONIES/mL STAPHYLOCOCCUS EPIDERMIDIS (A)  Final   Report Status 08/18/2024 FINAL  Final   Organism ID, Bacteria STAPHYLOCOCCUS EPIDERMIDIS (A)  Final      Susceptibility   Staphylococcus epidermidis - MIC*    CIPROFLOXACIN  <=0.5 SENSITIVE Sensitive     GENTAMICIN <=0.5 SENSITIVE Sensitive     NITROFURANTOIN <=16 SENSITIVE Sensitive     OXACILLIN <=0.25 SENSITIVE Sensitive     TETRACYCLINE <=1 SENSITIVE Sensitive     VANCOMYCIN  1 SENSITIVE Sensitive     TRIMETH/SULFA <=10 SENSITIVE Sensitive     RIFAMPIN <=0.5 SENSITIVE Sensitive     Inducible Clindamycin NEGATIVE Sensitive     * >=100,000 COLONIES/mL STAPHYLOCOCCUS EPIDERMIDIS    Labs: CBC: Recent Labs  Lab 08/15/24 1518 08/15/24 1923 08/16/24 0144 08/16/24 1837 08/17/24 0453  WBC 14.6*  --  11.8*  --  9.0  NEUTROABS 11.9*  --   --   --   --   HGB 7.0* 5.9* 7.1* 7.7* 9.4*  HCT 23.1* 19.6* 22.4* 23.2* 29.0*  MCV 94.7  --  91.4  --  89.0  PLT 378  --  293  --  230   Basic Metabolic Panel: Recent Labs  Lab  08/15/24 1518 08/16/24 0144 08/17/24 0453  NA 137 138 139  K 3.8 3.3* 3.4*  CL 99 105 106  CO2 22 23 26   GLUCOSE 205* 102* 94  BUN 48* 43* 18  CREATININE 0.70 0.64 0.69  CALCIUM  8.8* 8.1* 8.0*   Liver Function Tests: Recent Labs  Lab 08/15/24  1518  AST 18  ALT 12  ALKPHOS 56  BILITOT 0.9  PROT 6.2*  ALBUMIN  3.7   CBG: Recent Labs  Lab 08/17/24 1117 08/17/24 1624 08/18/24 0017 08/18/24 0600 08/18/24 0628  GLUCAP 161* 155* 198* 162* 147*    Discharge time spent: greater than 30 minutes.  Signed: Bernardino KATHEE Come, MD [facility] 08/18/2024

## 2024-08-18 NOTE — Plan of Care (Signed)

## 2024-08-18 NOTE — TOC Transition Note (Signed)
 Transition of Care [name]) - Discharge Note   Patient Details  Name: Jeanne Kim MRN: [id number] Date of Birth: Sep 02, 1941  [facility]) CM/SW Contact:  Noreen KATHEE Cleotilde ISRAEL Phone Number: 08/18/2024, 10:44 AM   Clinical Narrative:     CSW seen patient this AM and asked about HHPT. Patient asked CSW to call her husband to see if it was fine. CSW spoke with husband and he said it was fine. Husband made aware of DC today. CSW spoke with patient at bedside regarding HH options. Patient agreeable to [facility]. CSW updated Delon about patient accepting HHPT. CSW signing off.   Final next level of care: Home w Home Health Services Barriers to Discharge: Barriers Resolved   Patient Goals and CMS Choice Patient states their goals for this hospitalization and ongoing recovery are:: return back home CMS Medicare.gov Compare Post Acute Care list provided to:: Patient Choice offered to / list presented to : Patient      Discharge Placement                Patient to be transferred to facility by: POV Name of family member notified: Prentice- Husband Patient and family notified of of transfer: 08/18/24  Discharge Plan and Services Additional resources added to the After Visit Summary for                            [facility] Arranged: PT [facility] Agency: [facility] Date [facility] Agency Contacted: 08/18/24 Time HH Agency Contacted: 1043 Representative spoke with at [facility] Agency: Delon  Social Drivers of Health (SDOH) Interventions SDOH Screenings   Food Insecurity: Food Insecurity Present (08/15/2024)  Housing: Low Risk  (08/15/2024)  Transportation Needs: Unmet Transportation Needs (08/15/2024)  Utilities: Not At Risk (08/15/2024)  Financial Resource Strain: Low Risk (10/19/2022)   Received from [facility] Care  Physical Activity: Insufficiently Active (02/04/2020)   Received from [facility] visits prior to 12/08/2022.  Social Connections:  Socially Integrated (08/15/2024)  Stress: No Stress Concern Present (02/04/2020)   Received from [facility] visits prior to 12/08/2022.  Tobacco Use: Low Risk  (08/15/2024)     Readmission Risk Interventions    08/18/2024   10:41 AM 09/13/2022    1:16 PM 05/16/2022   11:25 AM  Readmission Risk Prevention Plan  Transportation Screening Complete Complete Complete  HRI or Home Care Consult Complete Complete   Social Work Consult for Recovery Care Planning/Counseling Complete Complete   Palliative Care Screening Not Applicable Not Applicable   Medication Review Oceanographer) Complete Complete Complete  HRI or Home Care Consult   Complete  SW Recovery Care/Counseling Consult   Complete  Palliative Care Screening   Not Applicable  Skilled Nursing Facility   Not Applicable

## 2024-08-21 NOTE — Telephone Encounter (Signed)
 noted

## 2024-08-23 NOTE — Telephone Encounter (Signed)
 Hospital follow-up. She would not be new from what I can see, as she was seen by [facility] while inpatient in June 2025 and then by us  in Nov 2025. She has seen them as outpatient in the past.

## 2024-10-01 ENCOUNTER — Other Ambulatory Visit: Payer: Self-pay

## 2024-10-01 ENCOUNTER — Encounter (HOSPITAL_COMMUNITY): Payer: Self-pay

## 2024-10-01 ENCOUNTER — Inpatient Hospital Stay (HOSPITAL_COMMUNITY)

## 2024-10-01 ENCOUNTER — Inpatient Hospital Stay (HOSPITAL_COMMUNITY)
Admission: EM | Admit: 2024-10-01 | Discharge: 2024-10-04 | DRG: 811 | Disposition: A | Discharge status: Home / Self Care | Attending: Internal Medicine | Admitting: Internal Medicine

## 2024-10-01 DIAGNOSIS — D62 Acute posthemorrhagic anemia: Principal | ICD-10-CM | POA: Diagnosis present

## 2024-10-01 DIAGNOSIS — Z880 Allergy status to penicillin: Secondary | ICD-10-CM

## 2024-10-01 DIAGNOSIS — K222 Esophageal obstruction: Secondary | ICD-10-CM | POA: Diagnosis present

## 2024-10-01 DIAGNOSIS — Z8249 Family history of ischemic heart disease and other diseases of the circulatory system: Secondary | ICD-10-CM

## 2024-10-01 DIAGNOSIS — M797 Fibromyalgia: Secondary | ICD-10-CM | POA: Diagnosis present

## 2024-10-01 DIAGNOSIS — Z90710 Acquired absence of both cervix and uterus: Secondary | ICD-10-CM

## 2024-10-01 DIAGNOSIS — M810 Age-related osteoporosis without current pathological fracture: Secondary | ICD-10-CM | POA: Diagnosis present

## 2024-10-01 DIAGNOSIS — K254 Chronic or unspecified gastric ulcer with hemorrhage: Secondary | ICD-10-CM | POA: Diagnosis present

## 2024-10-01 DIAGNOSIS — F32A Depression, unspecified: Secondary | ICD-10-CM | POA: Diagnosis present

## 2024-10-01 DIAGNOSIS — N390 Urinary tract infection, site not specified: Secondary | ICD-10-CM | POA: Diagnosis present

## 2024-10-01 DIAGNOSIS — Z885 Allergy status to narcotic agent status: Secondary | ICD-10-CM

## 2024-10-01 DIAGNOSIS — K920 Hematemesis: Principal | ICD-10-CM | POA: Diagnosis present

## 2024-10-01 DIAGNOSIS — E872 Acidosis, unspecified: Secondary | ICD-10-CM | POA: Diagnosis present

## 2024-10-01 DIAGNOSIS — E871 Hypo-osmolality and hyponatremia: Secondary | ICD-10-CM | POA: Diagnosis present

## 2024-10-01 DIAGNOSIS — Z860100 Personal history of colon polyps, unspecified: Secondary | ICD-10-CM

## 2024-10-01 DIAGNOSIS — Z7984 Long term (current) use of oral hypoglycemic drugs: Secondary | ICD-10-CM

## 2024-10-01 DIAGNOSIS — F419 Anxiety disorder, unspecified: Secondary | ICD-10-CM | POA: Diagnosis present

## 2024-10-01 DIAGNOSIS — T182XXA Foreign body in stomach, initial encounter: Secondary | ICD-10-CM | POA: Diagnosis not present

## 2024-10-01 DIAGNOSIS — I2510 Atherosclerotic heart disease of native coronary artery without angina pectoris: Secondary | ICD-10-CM | POA: Diagnosis present

## 2024-10-01 DIAGNOSIS — Z800 Family history of malignant neoplasm of digestive organs: Secondary | ICD-10-CM

## 2024-10-01 DIAGNOSIS — E861 Hypovolemia: Secondary | ICD-10-CM | POA: Diagnosis present

## 2024-10-01 DIAGNOSIS — Z853 Personal history of malignant neoplasm of breast: Secondary | ICD-10-CM

## 2024-10-01 DIAGNOSIS — E11649 Type 2 diabetes mellitus with hypoglycemia without coma: Secondary | ICD-10-CM | POA: Diagnosis present

## 2024-10-01 DIAGNOSIS — K5730 Diverticulosis of large intestine without perforation or abscess without bleeding: Secondary | ICD-10-CM | POA: Diagnosis present

## 2024-10-01 DIAGNOSIS — Z882 Allergy status to sulfonamides status: Secondary | ICD-10-CM

## 2024-10-01 DIAGNOSIS — Z83438 Family history of other disorder of lipoprotein metabolism and other lipidemia: Secondary | ICD-10-CM

## 2024-10-01 DIAGNOSIS — Z83719 Family history of colon polyps, unspecified: Secondary | ICD-10-CM

## 2024-10-01 DIAGNOSIS — Z79899 Other long term (current) drug therapy: Secondary | ICD-10-CM

## 2024-10-01 DIAGNOSIS — K317 Polyp of stomach and duodenum: Secondary | ICD-10-CM | POA: Diagnosis present

## 2024-10-01 DIAGNOSIS — K633 Ulcer of intestine: Secondary | ICD-10-CM | POA: Diagnosis present

## 2024-10-01 DIAGNOSIS — Z79890 Hormone replacement therapy (postmenopausal): Secondary | ICD-10-CM | POA: Diagnosis not present

## 2024-10-01 DIAGNOSIS — E7800 Pure hypercholesterolemia, unspecified: Secondary | ICD-10-CM | POA: Diagnosis present

## 2024-10-01 DIAGNOSIS — K649 Unspecified hemorrhoids: Secondary | ICD-10-CM | POA: Diagnosis present

## 2024-10-01 DIAGNOSIS — K219 Gastro-esophageal reflux disease without esophagitis: Secondary | ICD-10-CM | POA: Diagnosis present

## 2024-10-01 DIAGNOSIS — E890 Postprocedural hypothyroidism: Secondary | ICD-10-CM | POA: Diagnosis present

## 2024-10-01 DIAGNOSIS — Z87442 Personal history of urinary calculi: Secondary | ICD-10-CM

## 2024-10-01 DIAGNOSIS — G4730 Sleep apnea, unspecified: Secondary | ICD-10-CM | POA: Diagnosis present

## 2024-10-01 DIAGNOSIS — I10 Essential (primary) hypertension: Secondary | ICD-10-CM | POA: Diagnosis present

## 2024-10-01 DIAGNOSIS — Z833 Family history of diabetes mellitus: Secondary | ICD-10-CM

## 2024-10-01 DIAGNOSIS — K449 Diaphragmatic hernia without obstruction or gangrene: Secondary | ICD-10-CM | POA: Diagnosis not present

## 2024-10-01 DIAGNOSIS — Z7982 Long term (current) use of aspirin: Secondary | ICD-10-CM

## 2024-10-01 DIAGNOSIS — K257 Chronic gastric ulcer without hemorrhage or perforation: Secondary | ICD-10-CM | POA: Diagnosis present

## 2024-10-01 DIAGNOSIS — Z1152 Encounter for screening for COVID-19: Secondary | ICD-10-CM | POA: Diagnosis not present

## 2024-10-01 DIAGNOSIS — Z8041 Family history of malignant neoplasm of ovary: Secondary | ICD-10-CM

## 2024-10-01 DIAGNOSIS — D509 Iron deficiency anemia, unspecified: Secondary | ICD-10-CM | POA: Diagnosis present

## 2024-10-01 DIAGNOSIS — Z888 Allergy status to other drugs, medicaments and biological substances status: Secondary | ICD-10-CM

## 2024-10-01 DIAGNOSIS — K259 Gastric ulcer, unspecified as acute or chronic, without hemorrhage or perforation: Secondary | ICD-10-CM | POA: Diagnosis not present

## 2024-10-01 DIAGNOSIS — Z801 Family history of malignant neoplasm of trachea, bronchus and lung: Secondary | ICD-10-CM

## 2024-10-01 LAB — CBC
HCT: 28.7 % — ABNORMAL LOW (ref 36.0–46.0)
Hemoglobin: 8.5 g/dL — ABNORMAL LOW (ref 12.0–15.0)
MCH: 24.6 pg — ABNORMAL LOW (ref 26.0–34.0)
MCHC: 29.6 g/dL — ABNORMAL LOW (ref 30.0–36.0)
MCV: 83.2 fL (ref 80.0–100.0)
Platelets: 361 K/uL (ref 150–400)
RBC: 3.45 MIL/uL — ABNORMAL LOW (ref 3.87–5.11)
RDW: 16.8 % — ABNORMAL HIGH (ref 11.5–15.5)
WBC: 13.2 K/uL — ABNORMAL HIGH (ref 4.0–10.5)
nRBC: 0 % (ref 0.0–0.2)

## 2024-10-01 LAB — COMPREHENSIVE METABOLIC PANEL WITH GFR
ALT: 10 U/L (ref 0–44)
AST: 19 U/L (ref 15–41)
Albumin: 3.8 g/dL (ref 3.5–5.0)
Alkaline Phosphatase: 72 U/L (ref 38–126)
Anion gap: 19 — ABNORMAL HIGH (ref 5–15)
BUN: 43 mg/dL — ABNORMAL HIGH (ref 8–23)
CO2: 22 mmol/L (ref 22–32)
Calcium: 9 mg/dL (ref 8.9–10.3)
Chloride: 93 mmol/L — ABNORMAL LOW (ref 98–111)
Creatinine, Ser: 0.93 mg/dL (ref 0.44–1.00)
GFR, Estimated: >60 mL/min
Glucose, Bld: 145 mg/dL — ABNORMAL HIGH (ref 70–99)
Potassium: 3.6 mmol/L (ref 3.5–5.1)
Sodium: 134 mmol/L — ABNORMAL LOW (ref 135–145)
Total Bilirubin: 0.8 mg/dL (ref 0.0–1.2)
Total Protein: 6.7 g/dL (ref 6.5–8.1)

## 2024-10-01 LAB — URINALYSIS, ROUTINE W REFLEX MICROSCOPIC
Bilirubin Urine: NEGATIVE
Glucose, UA: >=500 mg/dL — AB
Ketones, ur: NEGATIVE mg/dL
Nitrite: NEGATIVE
Protein, ur: NEGATIVE mg/dL
Specific Gravity, Urine: 1.011 (ref 1.005–1.030)
pH: 5 (ref 5.0–8.0)

## 2024-10-01 LAB — LACTIC ACID, PLASMA
Lactic Acid, Venous: 3.7 mmol/L (ref 0.5–1.9)
Lactic Acid, Venous: 2.3 mmol/L (ref 0.5–1.9)

## 2024-10-01 LAB — CK: Total CK: 38 U/L (ref 38–234)

## 2024-10-01 LAB — GLUCOSE, CAPILLARY
Glucose-Capillary: 75 mg/dL (ref 70–99)
Glucose-Capillary: 161 mg/dL — ABNORMAL HIGH (ref 70–99)

## 2024-10-01 LAB — RESP PANEL BY RT-PCR (RSV, FLU A&B, COVID)  RVPGX2
Influenza A by PCR: NEGATIVE
Influenza B by PCR: NEGATIVE
Resp Syncytial Virus by PCR: NEGATIVE
SARS Coronavirus 2 by RT PCR: NEGATIVE

## 2024-10-01 MED ORDER — PANTOPRAZOLE SODIUM 40 MG IV SOLR
40.0000 mg | Freq: Once | INTRAVENOUS | Status: AC
  Administered 2024-10-01: 40 mg via INTRAVENOUS
  Filled 2024-10-01: qty 10

## 2024-10-01 MED ORDER — PANTOPRAZOLE SODIUM 40 MG IV SOLR
40.0000 mg | Freq: Two times a day (BID) | INTRAVENOUS | Status: DC
  Administered 2024-10-01 – 2024-10-03 (×5): 40 mg via INTRAVENOUS
  Filled 2024-10-01 (×5): qty 10

## 2024-10-01 MED ORDER — SODIUM CHLORIDE 0.9 % IV SOLN: INTRAVENOUS | Status: AC

## 2024-10-01 MED ORDER — LEVOTHYROXINE SODIUM 100 MCG PO TABS
100.0000 ug | ORAL_TABLET | Freq: Every day | ORAL | Status: DC
  Administered 2024-10-02 – 2024-10-04 (×3): 100 ug via ORAL
  Filled 2024-10-01 (×3): qty 1

## 2024-10-01 MED ORDER — PHENAZOPYRIDINE HCL 100 MG PO TABS
100.0000 mg | ORAL_TABLET | Freq: Three times a day (TID) | ORAL | Status: DC
  Administered 2024-10-01 – 2024-10-04 (×9): 100 mg via ORAL
  Filled 2024-10-01 (×9): qty 1

## 2024-10-01 MED ORDER — ACETAMINOPHEN 325 MG PO TABS
650.0000 mg | ORAL_TABLET | Freq: Four times a day (QID) | ORAL | Status: DC | PRN
  Administered 2024-10-02 – 2024-10-03 (×3): 650 mg via ORAL
  Filled 2024-10-01 (×3): qty 2

## 2024-10-01 MED ORDER — INSULIN ASPART 100 UNIT/ML IJ SOLN: 0.0000 [IU] | Freq: Every day | INTRAMUSCULAR | Status: DC

## 2024-10-01 MED ORDER — ACETAMINOPHEN 650 MG RE SUPP: 650.0000 mg | Freq: Four times a day (QID) | RECTAL | Status: DC | PRN

## 2024-10-01 MED ORDER — SUCRALFATE 1 GM/10ML PO SUSP
1.0000 g | Freq: Three times a day (TID) | ORAL | Status: DC
  Administered 2024-10-01 – 2024-10-04 (×13): 1 g via ORAL
  Filled 2024-10-01 (×13): qty 10

## 2024-10-01 MED ORDER — INSULIN ASPART 100 UNIT/ML IJ SOLN: 0.0000 [IU] | Freq: Three times a day (TID) | INTRAMUSCULAR | Status: DC

## 2024-10-01 MED ORDER — SODIUM CHLORIDE 0.9 % IV BOLUS
500.0000 mL | Freq: Once | INTRAVENOUS | Status: AC
  Administered 2024-10-01: 500 mL via INTRAVENOUS

## 2024-10-01 MED ORDER — ALBUTEROL SULFATE (2.5 MG/3ML) 0.083% IN NEBU: 2.5000 mg | INHALATION_SOLUTION | RESPIRATORY_TRACT | Status: DC | PRN

## 2024-10-01 MED ORDER — SENNOSIDES-DOCUSATE SODIUM 8.6-50 MG PO TABS: 1.0000 | ORAL_TABLET | Freq: Every evening | ORAL | Status: DC | PRN

## 2024-10-01 MED ORDER — ATORVASTATIN CALCIUM 20 MG PO TABS
20.0000 mg | ORAL_TABLET | Freq: Every evening | ORAL | Status: DC
  Administered 2024-10-02 – 2024-10-03 (×2): 20 mg via ORAL
  Filled 2024-10-01 (×2): qty 1

## 2024-10-01 MED ORDER — HYDRALAZINE HCL 20 MG/ML IJ SOLN: 10.0000 mg | Freq: Four times a day (QID) | INTRAMUSCULAR | Status: DC | PRN

## 2024-10-01 NOTE — ED Provider Notes (Signed)
 " [place] EMERGENCY DEPARTMENT AT [facility] Provider Note   CSN: [id number] Arrival date & time: 10/01/24  9049     Patient presents with: Weakness and Emesis   Jeanne Kim is a 83 y.o. female with history of recent admission for GI bleed with significant anemia presents with complaints of hematemesis started this morning with feeling weak and having abdominal pain.  Without any diarrhea, urinary or vaginal symptoms.  Denies any hematochezia or melena.  No one at home is sick.  Lives with her husband.  Denies any use of blood thinners.  Reports compliance with her prescriptions from her recent admission.    Weakness Associated symptoms: vomiting   Emesis     Past Medical History:  Diagnosis Date   Allergy    Anemia    Anginal pain    Anxiety    Arthritis    Blood transfusion without reported diagnosis    Breast cancer (HCC)    Carotid artery plaque    bilat   Cataract    Colon polyps    adenomatous   Concussion 02/2016   Driving restrictions for 6w   Coronary heart disease    Depression    Diverticulosis    Dizziness    Esophageal reflux    Esophageal stricture    Fibromyalgia    Gallstones    Hiatal hernia    Hypercholesterolemia    Hypertension    Hypothyroidism    IBS (irritable bowel syndrome)    Kidney stones    Osteoporosis    Pneumonia    recently, just finished ABO   PONV (postoperative nausea and vomiting)    Sleep apnea    CPAP   Thyroid  disease 2002   Type II or unspecified type diabetes mellitus without mention of complication, not stated as uncontrolled    Unspecified gastritis and gastroduodenitis without mention of hemorrhage    Past Surgical History:  Procedure Laterality Date   ANAL RECTAL MANOMETRY N/A 08/05/2015   Procedure: ANO RECTAL MANOMETRY;  Surgeon: Gustav Shila GAILS, MD;  Location: [facility];  Service: Endoscopy;  Laterality: N/A;   APPENDECTOMY     BIOPSY  08/06/2020   Procedure: BIOPSY;  Surgeon:  Cindie Carlin POUR, DO;  Location: [facility];  Service: Endoscopy;;   BIOPSY  12/21/2020   Procedure: BIOPSY;  Surgeon: Eda Iha, MD;  Location: [facility];  Service: Gastroenterology;;   BIOPSY  12/23/2020   Procedure: BIOPSY;  Surgeon: Eda Iha, MD;  Location: [facility];  Service: Gastroenterology;;   BREAST LUMPECTOMY     left   CARDIAC CATHETERIZATION     CATARACT EXTRACTION W/PHACO Left 03/30/2013   Procedure: CATARACT EXTRACTION PHACO AND INTRAOCULAR LENS PLACEMENT (IOC);  Surgeon: Cherene Mania, MD;  Location: [facility];  Service: Ophthalmology;  Laterality: Left;  CDE:19.28   CATARACT EXTRACTION W/PHACO Right 04/09/2013   Procedure: CATARACT EXTRACTION PHACO AND INTRAOCULAR LENS PLACEMENT (IOC);  Surgeon: Cherene Mania, MD;  Location: [facility];  Service: Ophthalmology;  Laterality: Right;  CDE: 15.44   CERVICAL FUSION     CESAREAN SECTION     CHOLECYSTECTOMY     COLONOSCOPY WITH PROPOFOL  N/A 12/23/2020   Procedure: COLONOSCOPY WITH PROPOFOL ;  Surgeon: Eda Iha, MD;  Location: [facility];  Service: Gastroenterology;  Laterality: N/A;   CYSTOCELE REPAIR     DILATION AND CURETTAGE OF UTERUS     ENTEROSCOPY N/A 03/10/2024   Procedure: ENTEROSCOPY;  Surgeon: Suzann Inocente HERO, MD;  Location: [facility];  Service: Gastroenterology;  Laterality: N/A;   ESOPHAGOGASTRODUODENOSCOPY N/A 08/16/2024   Procedure: EGD (ESOPHAGOGASTRODUODENOSCOPY);  Surgeon: Cindie Carlin POUR, DO;  Location: [facility];  Service: Endoscopy;  Laterality: N/A;   ESOPHAGOGASTRODUODENOSCOPY (EGD) WITH PROPOFOL  N/A 08/06/2020   Procedure: ESOPHAGOGASTRODUODENOSCOPY (EGD) WITH PROPOFOL ;  Surgeon: Cindie Carlin POUR, DO;  [facility]: [facility];  Service: Endoscopy;  Laterality: N/A;   ESOPHAGOGASTRODUODENOSCOPY (EGD) WITH PROPOFOL  N/A 12/21/2020   Procedure: ESOPHAGOGASTRODUODENOSCOPY (EGD) WITH PROPOFOL ;  Surgeon: Eda Iha, MD;  Location: [facility];  Service: Gastroenterology;   Laterality: N/A;   EYE SURGERY Left 03-30-13   Cataract   EYE SURGERY Right 04-09-13   Cataract   RECTOCELE REPAIR     THYROIDECTOMY     TONSILLECTOMY     TOTAL ABDOMINAL HYSTERECTOMY       Prior to Admission medications  Medication Sig Start Date End Date Taking? Authorizing Provider  ASPIRIN  LOW DOSE 81 MG tablet Take 81 mg by mouth every morning. 04/17/22   [provider]  atorvastatin  (LIPITOR) 20 MG tablet Take 20 mg by mouth every evening. 08/28/22   [provider]  doxepin  (SINEQUAN ) 10 MG capsule Take 10 mg by mouth at bedtime. 08/28/22   [provider]  DULoxetine  (CYMBALTA ) 60 MG capsule Take 60 mg by mouth daily. 02/07/24   [provider]  EPINEPHrine  0.3 mg/0.3 mL IJ SOAJ injection Inject 0.3 mg into the muscle daily as needed for anaphylaxis. 07/18/09   [provider]  ferrous sulfate  325 (65 FE) MG tablet Take 1 tablet (325 mg total) by mouth daily with breakfast. 03/12/24   Odell Celinda Balo, MD  furosemide  (LASIX ) 20 MG tablet Take 1 tablet (20 mg total) by mouth daily. 06/01/22   Landy Barnie RAMAN, NP  Insulin  Glargine-Lixisenatide  (SOLIQUA ) 100-33 UNT-MCG/ML SOPN Inject 20 Units into the skin daily. Sliding Scale 06/01/22   Landy Barnie RAMAN, NP  JARDIANCE  25 MG TABS tablet Take 1 tablet (25 mg total) by mouth daily. 06/01/22   Landy Barnie RAMAN, NP  levothyroxine  (SYNTHROID ) 100 MCG tablet Take 100 mcg by mouth daily before breakfast.    [provider]  levothyroxine  (SYNTHROID ) 125 MCG tablet Take 1 tablet (125 mcg total) by mouth daily at 6 (six) AM. Patient not taking: Reported on 08/15/2024 03/13/24   Odell Celinda Balo, MD  metFORMIN (GLUCOPHAGE) 1000 MG tablet Take 1,000 mg by mouth 2 (two) times daily. 02/07/24   [provider]  midodrine  (PROAMATINE ) 2.5 MG tablet Take 2.5 mg by mouth 3 (three) times daily. 08/03/24   [provider]  pantoprazole  (PROTONIX ) 40 MG tablet Take 1 tablet (40 mg total)  by mouth 2 (two) times daily. 08/18/24   Bryn Bernardino NOVAK, MD  pioglitazone (ACTOS) 45 MG tablet Take 45 mg by mouth daily. 02/07/24   [provider]  polyethylene glycol powder (GLYCOLAX /MIRALAX ) 17 GM/SCOOP powder Take 17 g by mouth daily. 03/12/24   Odell Celinda Balo, MD  potassium chloride  SA (KLOR-CON  M) 20 MEQ tablet Take 1 tablet (20 mEq total) by mouth daily. 09/16/22   Shahmehdi, Adriana LABOR, MD  sucralfate  (CARAFATE ) 1 GM/10ML suspension Take 10 mLs (1 g total) by mouth 4 (four) times daily -  with meals and at bedtime for 14 days. 08/18/24 09/01/24  Bryn Bernardino NOVAK, MD    Allergies: Codeine, Sulfonamide derivatives, Canagliflozin, Penicillins, and Sulfa antibiotics    Review of Systems  Gastrointestinal:  Positive for vomiting.  Neurological:  Positive for weakness.  Updated Vital Signs BP (!) 111/59   Pulse 71   Temp 97.7 F (36.5 C) (Oral)   Resp 17   Ht 5' 6 (1.676 m)   Wt 68 kg   SpO2 95%   BMI 24.21 kg/m   Physical Exam Vitals and nursing note reviewed.  Constitutional:      General: She is not in acute distress.    Appearance: She is well-developed.     Comments: Dark brown vomitus on patient's hoodie  HENT:     Head: Normocephalic and atraumatic.  Eyes:     Conjunctiva/sclera: Conjunctivae normal.  Cardiovascular:     Rate and Rhythm: Normal rate and regular rhythm.     Heart sounds: No murmur heard. Pulmonary:     Effort: Pulmonary effort is normal. No respiratory distress.     Breath sounds: Normal breath sounds.  Abdominal:     Palpations: Abdomen is soft.     Tenderness: There is no abdominal tenderness.  Musculoskeletal:        General: No swelling.     Cervical back: Neck supple.  Skin:    General: Skin is warm and dry.     Capillary Refill: Capillary refill takes less than 2 seconds.  Neurological:     Mental Status: She is alert.     Comments: Patient is alert and oriented. There is no abnormal phonation. Symmetric smile without facial  droop. No pronator drift. Moves all extremities spontaneously. 5/5 strength in upper and lower extremities. . No sensation deficit. There is no nystagmus. EOMI, PERRL.  Psychiatric:        Mood and Affect: Mood normal.     (all labs ordered are listed, but only abnormal results are displayed) Labs Reviewed  COMPREHENSIVE METABOLIC PANEL WITH GFR - Abnormal; Notable for the following components:      Result Value   Sodium 134 (*)    Chloride 93 (*)    Glucose, Bld 145 (*)    BUN 43 (*)    Anion gap 19 (*)    All other components within normal limits  CBC - Abnormal; Notable for the following components:   WBC 13.2 (*)    RBC 3.45 (*)    Hemoglobin 8.5 (*)    HCT 28.7 (*)    MCH 24.6 (*)    MCHC 29.6 (*)    RDW 16.8 (*)    All other components within normal limits  LACTIC ACID, PLASMA - Abnormal; Notable for the following components:   Lactic Acid, Venous 3.7 (*)    All other components within normal limits  RESP PANEL BY RT-PCR (RSV, FLU A&B, COVID)  RVPGX2  CULTURE, BLOOD (ROUTINE X 2)  CULTURE, BLOOD (ROUTINE X 2)  CK  LACTIC ACID, PLASMA  URINALYSIS, ROUTINE W REFLEX MICROSCOPIC  TYPE AND SCREEN    EKG: EKG Interpretation Date/Time:  Thursday October 01 2024 10:39:28 EST Ventricular Rate:  75 PR Interval:  137 QRS Duration:  91 QT Interval:  406 QTC Calculation: 442 R Axis:   -5  Text Interpretation: Sinus rhythm Atrial premature complex Abnormal R-wave progression, early transition Borderline T wave abnormalities Confirmed by Ula Barter [phone]) on 10/01/2024 10:45:19 AM  Radiology: No results found.   Procedures   Medications Ordered in the ED  sucralfate  (CARAFATE ) 1 GM/10ML suspension 1 g (has no administration in time range)  pantoprazole  (PROTONIX ) injection 40 mg (has no administration in time range)  acetaminophen  (TYLENOL ) tablet 650 mg (has no administration in time  range)    Or  acetaminophen  (TYLENOL ) suppository 650 mg (has no administration  in time range)  senna-docusate (Senokot-S) tablet 1 tablet (has no administration in time range)  albuterol  (PROVENTIL ) (2.5 MG/3ML) 0.083% nebulizer solution 2.5 mg (has no administration in time range)  hydrALAZINE  (APRESOLINE ) injection 10 mg (has no administration in time range)  atorvastatin  (LIPITOR) tablet 20 mg (has no administration in time range)  levothyroxine  (SYNTHROID ) tablet 100 mcg (has no administration in time range)  pantoprazole  (PROTONIX ) injection 40 mg (40 mg Intravenous Given 10/01/24 1047)  sodium chloride  0.9 % bolus 500 mL (500 mLs Intravenous New Bag/Given 10/01/24 1120)    Clinical Course as of 10/01/24 1308  Thu Oct 01, 2024  1030 Patient with history recent admission for GI bleed evaluated for complaints of hematemesis described as coffee-ground.  Had associated abdominal pain and weakness.  Upon arrival patient is hypertensive otherwise hemodynamically stable.  Her abdomen is nontender.  She is alert and oriented.  Is without any hematochezia or melena.  Will obtain routine labs, type and screen and provide PPI Carafate  [JT]  1049 CBC(!) Mild leukocytosis to 13, hemoglobin 8.5, was 9.4 one month ago [JT]  1116 Comprehensive metabolic panel(!) Anion gap of 19, bicarb within normal limits, glucose mildly elevated [JT]  1117 CK Within normal limits [JT]  1241 Discussed patient with hospitalist, Dr. Dino.  Agreed for admission, requested I reach out to GI.  Discussed patient with Dr. Cinderella who recommended keeping patient n.p.o. at midnight for possible EGD tomorrow, PPI twice daily [JT]    Clinical Course User Index [JT] Donnajean Lynwood DEL, PA-C                                 Medical Decision Making Amount and/or Complexity of Data Reviewed Labs: ordered. Decision-making details documented in ED Course.  Risk Prescription drug management. Decision regarding hospitalization.   This patient presents to the ED with chief complaint(s) of emesis .  The  complaint involves an extensive differential diagnosis and also carries with it a high risk of complications and morbidity.   Pertinent past medical history as listed in HPI  The differential diagnosis includes  Recurrence of GI bleed, gastroenteritis, other acute abdominal pathology Additional history obtained: Records reviewed Care Everywhere/External Records  Disposition:   Patient will be discharged home. The patient has been appropriately medically screened and/or stabilized in the ED. I have low suspicion for any other emergent medical condition which would require further screening, evaluation or treatment in the ED or require inpatient management. At time of discharge the patient is hemodynamically stable and in no acute distress. I have discussed work-up results and diagnosis with patient and answered all questions. Patient is agreeable with discharge plan. We discussed strict return precautions for returning to the emergency department and they verbalized understanding.     Social Determinants of Health:   none  This note was dictated with voice recognition software.  Despite best efforts at proofreading, errors may have occurred which can change the documentation meaning.       Final diagnoses:  Hematemesis, unspecified whether nausea present    ED Discharge Orders     None          Donnajean Lynwood DEL DEVONNA 10/01/24 1308    Ula Prentice SAUNDERS, MD 10/01/24 1455  "

## 2024-10-01 NOTE — ED Triage Notes (Signed)
 Patient BIB [facility] from home for complaint of vomiting that is coffee ground denise diarrhea stated she layed down on the floor in house due to weakness. CBG 221, BP 120/50, 100%, pulse 84, 18 Respiration.

## 2024-10-01 NOTE — H&P (Addendum)
 " History and Physical    Patient: Jeanne Kim FMW:[id number] DOB: [birth year] DOA: 10/01/2024 DOS: the patient was seen and examined on 10/01/2024 PCP: Orpha Yancey LABOR, MD  Patient coming from: Home  Chief Complaint:  Chief Complaint  Patient presents with   Weakness   Emesis   HPI: Jeanne Kim is a 83 y.o. female with medical history significant of multiple comorbidities including type 2 diabetes mellitus, coronary artery disease, gastric ulcer, anemia, cognitive impairment, hiatal hernia, hypothyroidism, who presented to the emergency room with chief complaint of coffee-ground emesis. She said she had couple of episodes but couldn't tell me whether it was yesterday or today. She does take baby aspirin  occasionally but denied use of other NSAIDs.  Of note, patient was recently admitted to this facility from 08/15/2024 to 08/18/2024 because of similar symptoms of hematemesis.  At that time, she was using ibuprofen  2-3 times a week.  GI was consulted at that time and patient underwent EGD on 08/16/2024 by Dr. Cindie which showed evidence of mild Schatzki ring, large hiatal hernia with multiple Cameron ulcers which were treated with bipolar cautery and patient was discharged home on p.o. Protonix  twice a day.  In the ED, hemoglobin was found to be 8.5.  ED physician spoke to on-call gastroenterologist, Dr. Cinderella who recommended admitted the patient to the hospital, continuing with intravenous PPI twice daily, keeping n.p.o. after midnight for possible EGD in the morning. Review of Systems: As mentioned in the history of present illness. All other systems reviewed and are negative. Past Medical History:  Diagnosis Date   Allergy    Anemia    Anginal pain    Anxiety    Arthritis    Blood transfusion without reported diagnosis    Breast cancer (HCC)    Carotid artery plaque    bilat   Cataract    Colon polyps    adenomatous   Concussion 02/2016   Driving restrictions for 6w    Coronary heart disease    Depression    Diverticulosis    Dizziness    Esophageal reflux    Esophageal stricture    Fibromyalgia    Gallstones    Hiatal hernia    Hypercholesterolemia    Hypertension    Hypothyroidism    IBS (irritable bowel syndrome)    Kidney stones    Osteoporosis    Pneumonia    recently, just finished ABO   PONV (postoperative nausea and vomiting)    Sleep apnea    CPAP   Thyroid  disease 2002   Type II or unspecified type diabetes mellitus without mention of complication, not stated as uncontrolled    Unspecified gastritis and gastroduodenitis without mention of hemorrhage    Past Surgical History:  Procedure Laterality Date   ANAL RECTAL MANOMETRY N/A 08/05/2015   Procedure: ANO RECTAL MANOMETRY;  Surgeon: Gustav Shila GAILS, MD;  Location: [facility];  Service: Endoscopy;  Laterality: N/A;   APPENDECTOMY     BIOPSY  08/06/2020   Procedure: BIOPSY;  Surgeon: Cindie Carlin POUR, DO;  Location: [facility];  Service: Endoscopy;;   BIOPSY  12/21/2020   Procedure: BIOPSY;  Surgeon: Eda Iha, MD;  Location: [facility];  Service: Gastroenterology;;   BIOPSY  12/23/2020   Procedure: BIOPSY;  Surgeon: Eda Iha, MD;  Location: [facility];  Service: Gastroenterology;;   BREAST LUMPECTOMY     left   CARDIAC CATHETERIZATION     CATARACT EXTRACTION W/PHACO Left 03/30/2013  Procedure: CATARACT EXTRACTION PHACO AND INTRAOCULAR LENS PLACEMENT (IOC);  Surgeon: Cherene Mania, MD;  Location: [facility];  Service: Ophthalmology;  Laterality: Left;  CDE:19.28   CATARACT EXTRACTION W/PHACO Right 04/09/2013   Procedure: CATARACT EXTRACTION PHACO AND INTRAOCULAR LENS PLACEMENT (IOC);  Surgeon: Cherene Mania, MD;  Location: [facility];  Service: Ophthalmology;  Laterality: Right;  CDE: 15.44   CERVICAL FUSION     CESAREAN SECTION     CHOLECYSTECTOMY     COLONOSCOPY WITH PROPOFOL  N/A 12/23/2020   Procedure: COLONOSCOPY WITH PROPOFOL ;  Surgeon: Eda Iha, MD;   Location: [facility];  Service: Gastroenterology;  Laterality: N/A;   CYSTOCELE REPAIR     DILATION AND CURETTAGE OF UTERUS     ENTEROSCOPY N/A 03/10/2024   Procedure: ENTEROSCOPY;  Surgeon: Suzann Inocente HERO, MD;  Location: [facility];  Service: Gastroenterology;  Laterality: N/A;   ESOPHAGOGASTRODUODENOSCOPY N/A 08/16/2024   Procedure: EGD (ESOPHAGOGASTRODUODENOSCOPY);  Surgeon: Cindie Carlin POUR, DO;  [facility];  Service: Endoscopy;  Laterality: N/A;   ESOPHAGOGASTRODUODENOSCOPY (EGD) WITH PROPOFOL  N/A 08/06/2020   Procedure: ESOPHAGOGASTRODUODENOSCOPY (EGD) WITH PROPOFOL ;  Surgeon: Cindie Carlin POUR, DO;  [facility];  Service: Endoscopy;  Laterality: N/A;   ESOPHAGOGASTRODUODENOSCOPY (EGD) WITH PROPOFOL  N/A 12/21/2020   Procedure: ESOPHAGOGASTRODUODENOSCOPY (EGD) WITH PROPOFOL ;  Surgeon: Eda Iha, MD;  Location: [facility];  Service: Gastroenterology;  Laterality: N/A;   EYE SURGERY Left 03-30-13   Cataract   EYE SURGERY Right 04-09-13   Cataract   RECTOCELE REPAIR     THYROIDECTOMY     TONSILLECTOMY     TOTAL ABDOMINAL HYSTERECTOMY     Social History:  reports that she has never smoked. She has never used smokeless tobacco. She reports that she does not drink alcohol and does not use drugs.  Allergies[1]  Family History  Problem Relation Age of Onset   Sarcoidosis Mother    Diabetes Mother    Cancer Mother        sarcoma -  Right arm amputation   Hypertension Mother    Lung cancer Father    Diabetes Father    Heart disease Father        Before age 61   Urolithiasis Father    Cancer Father        Lung   Hyperlipidemia Father    Hypertension Father    Esophageal cancer Maternal Uncle    Ovarian cancer Maternal Aunt    Colon cancer Maternal Uncle    Lung cancer Maternal Uncle    Diabetes Maternal Uncle    Diabetes Maternal Aunt    Colon polyps Maternal Uncle    Heart disease Paternal Grandmother     Prior to Admission medications   Medication Sig Start Date End Date Taking? Authorizing Provider  ASPIRIN  LOW DOSE 81 MG tablet Take 81 mg by mouth every morning. 04/17/22   [provider]  atorvastatin  (LIPITOR) 20 MG tablet Take 20 mg by mouth every evening. 08/28/22   [provider]  doxepin  (SINEQUAN ) 10 MG capsule Take 10 mg by mouth at bedtime. 08/28/22   [provider]  DULoxetine  (CYMBALTA ) 60 MG capsule Take 60 mg by mouth daily. 02/07/24   [provider]  EPINEPHrine  0.3 mg/0.3 mL IJ SOAJ injection Inject 0.3 mg into the muscle daily as needed for anaphylaxis. 07/18/09   [provider]  ferrous sulfate  325 (65 FE) MG tablet Take 1 tablet (325 mg total) by mouth daily with breakfast. 03/12/24   Odell  Celinda Balo, MD  furosemide  (LASIX ) 20 MG tablet Take 1 tablet (20 mg total) by mouth daily. 06/01/22   Landy Barnie RAMAN, NP  Insulin  Glargine-Lixisenatide  (SOLIQUA ) 100-33 UNT-MCG/ML SOPN Inject 20 Units into the skin daily. Sliding Scale 06/01/22   Landy Barnie RAMAN, NP  JARDIANCE  25 MG TABS tablet Take 1 tablet (25 mg total) by mouth daily. 06/01/22   Landy Barnie RAMAN, NP  levothyroxine  (SYNTHROID ) 100 MCG tablet Take 100 mcg by mouth daily before breakfast.    [provider]  levothyroxine  (SYNTHROID ) 125 MCG tablet Take 1 tablet (125 mcg total) by mouth daily at 6 (six) AM. Patient not taking: Reported on 08/15/2024 03/13/24   Odell Celinda Balo, MD  metFORMIN (GLUCOPHAGE) 1000 MG tablet Take 1,000 mg by mouth 2 (two) times daily. 02/07/24   [provider]  midodrine  (PROAMATINE ) 2.5 MG tablet Take 2.5 mg by mouth 3 (three) times daily. 08/03/24   [provider]  pantoprazole  (PROTONIX ) 40 MG tablet Take 1 tablet (40 mg total) by mouth 2 (two) times daily. 08/18/24   Bryn Bernardino NOVAK, MD  pioglitazone (ACTOS) 45 MG tablet Take 45 mg by mouth daily. 02/07/24   [provider]  polyethylene glycol powder (GLYCOLAX /MIRALAX ) 17 GM/SCOOP powder Take  17 g by mouth daily. 03/12/24   Odell Celinda Balo, MD  potassium chloride  SA (KLOR-CON  M) 20 MEQ tablet Take 1 tablet (20 mEq total) by mouth daily. 09/16/22   Shahmehdi, Adriana LABOR, MD  sucralfate  (CARAFATE ) 1 GM/10ML suspension Take 10 mLs (1 g total) by mouth 4 (four) times daily -  with meals and at bedtime for 14 days. 08/18/24 09/01/24  Bryn Bernardino NOVAK, MD    Physical Exam: Vitals:   10/01/24 0955 10/01/24 1002 10/01/24 1117  BP: (!) 107/57  (!) 115/46  Pulse: 71    Resp: 18  (!) 22  Temp: 97.7 F (36.5 C)    TempSrc: Oral    SpO2: 94%    Weight:  68 kg   Height:  5' 6 (1.676 m)    Constitutional: appears slightly drowsy but able to respond to questions appropriately Eyes: PERRL, lids and conjunctivae normal ENMT: Mucous membranes are moist. Posterior pharynx clear of any exudate or lesions.Normal dentition.  Neck: normal, supple, no masses, no thyromegaly Respiratory: clear to auscultation bilaterally, no wheezing, no crackles. Normal respiratory effort. No accessory muscle use.  Cardiovascular: Regular rate and rhythm, no murmurs / rubs / gallops. No extremity edema. 2+ pedal pulses. No carotid bruits.  Abdomen: no tenderness, no masses palpated. No hepatosplenomegaly. Bowel sounds positive.  Musculoskeletal: no clubbing / cyanosis. No joint deformity upper and lower extremities. Good ROM, no contractures. Normal muscle tone.  Skin: no rashes, lesions, ulcers. No induration Neurologic: CN 2-12 grossly intact. Sensation intact, DTR normal. Strength 5/5 x all 4 extremities.   Data Reviewed:  There are no new results to review at this time.  Assessment and Plan:  Acute blood loss anemia, POA: -Likely upper GI bleed, POA: - In the setting of gastric ulcers and prior GI bleeds - EGD done on 08/16/2024 by Dr. Cindie showed evidence of mild Schatzki ring, large hiatal hernia with multiple Cameron ulcers, treated with bipolar cautery.  Patient had normal duodenal bulb, first portion  of duodenum and second portion of duodenum at that time. - GI consulted, continue with IV Protonix  40 mg twice a day, will keep the patient n.p.o. after midnight for EGD in the morning - Clear liquid diet for  now - Follow-up H&H closely.  No indication for blood transfusion at this point  Lactic acidosis,POA: Likely due to acute bleeding. No evidence of infection. F/u CXR F/u repeat CXR  Leukocytosis,POA: likely reactive secondary to bleeding. F/u CBC in am.  Hyponatremia,POA: Likely hypovolemic. Ordered NS at 75 cc /hr. F/u BMP in am.  Type 2 diabetes mellitus, POA: I have ordered sliding scale insulin  as well as Accu-Cheks.  Hypothyroidism, POA: Continue with Synthroid   Hyperlipidemia, POA: Continue with statin  Disposition: Home with husband.   Advance Care Planning:   Code Status: Prior Full  Consults: GI  Family Communication: None  Severity of Illness: The appropriate patient status for this patient is INPATIENT. Inpatient status is judged to be reasonable and necessary in order to provide the required intensity of service to ensure the patient's safety. The patient's presenting symptoms, physical exam findings, and initial radiographic and laboratory data in the context of their chronic comorbidities is felt to place them at high risk for further clinical deterioration. Furthermore, it is not anticipated that the patient will be medically stable for discharge from the hospital within 2 midnights of admission.   * I certify that at the point of admission it is my clinical judgment that the patient will require inpatient hospital care spanning beyond 2 midnights from the point of admission due to high intensity of service, high risk for further deterioration and high frequency of surveillance required.*  Author: Deliliah Room, MD 10/01/2024 12:32 PM  For on call review [email or web address].      [1]  Allergies Allergen Reactions   Codeine Hives   Sulfonamide Derivatives  Hives   Canagliflozin    Penicillins Hives    Pt states she does NOT have allergy to penicillin   Sulfa Antibiotics Other (See Comments)    unknown   "

## 2024-10-01 NOTE — Plan of Care (Signed)
" °  Problem: Education: Goal: Knowledge of General Education information will improve Description: Including pain rating scale, medication(s)/side effects and non-pharmacologic comfort measures Outcome: Progressing   Problem: Health Behavior/Discharge Planning: Goal: Ability to manage health-related needs will improve Outcome: Progressing   Problem: Clinical Measurements: Goal: Ability to maintain clinical measurements within normal limits will improve Outcome: Progressing   Problem: Clinical Measurements: Goal: Ability to maintain clinical measurements within normal limits will improve Outcome: Progressing   Problem: Clinical Measurements: Goal: Ability to maintain clinical measurements within normal limits will improve Outcome: Progressing   "

## 2024-10-02 ENCOUNTER — Inpatient Hospital Stay (HOSPITAL_COMMUNITY): Admitting: Anesthesiology

## 2024-10-02 ENCOUNTER — Other Ambulatory Visit: Payer: Self-pay

## 2024-10-02 ENCOUNTER — Encounter (HOSPITAL_COMMUNITY): Payer: Self-pay | Admitting: Internal Medicine

## 2024-10-02 ENCOUNTER — Encounter (HOSPITAL_COMMUNITY): Admission: EM | Disposition: A | Payer: Self-pay | Source: Home / Self Care | Attending: Internal Medicine

## 2024-10-02 DIAGNOSIS — K449 Diaphragmatic hernia without obstruction or gangrene: Secondary | ICD-10-CM | POA: Diagnosis not present

## 2024-10-02 DIAGNOSIS — K920 Hematemesis: Secondary | ICD-10-CM | POA: Diagnosis not present

## 2024-10-02 DIAGNOSIS — T182XXA Foreign body in stomach, initial encounter: Secondary | ICD-10-CM

## 2024-10-02 DIAGNOSIS — I10 Essential (primary) hypertension: Secondary | ICD-10-CM

## 2024-10-02 DIAGNOSIS — E1165 Type 2 diabetes mellitus with hyperglycemia: Secondary | ICD-10-CM

## 2024-10-02 DIAGNOSIS — D509 Iron deficiency anemia, unspecified: Secondary | ICD-10-CM

## 2024-10-02 DIAGNOSIS — I25119 Atherosclerotic heart disease of native coronary artery with unspecified angina pectoris: Secondary | ICD-10-CM

## 2024-10-02 DIAGNOSIS — K259 Gastric ulcer, unspecified as acute or chronic, without hemorrhage or perforation: Secondary | ICD-10-CM | POA: Diagnosis not present

## 2024-10-02 DIAGNOSIS — D62 Acute posthemorrhagic anemia: Secondary | ICD-10-CM | POA: Diagnosis not present

## 2024-10-02 HISTORY — PX: ESOPHAGOGASTRODUODENOSCOPY: SHX5428

## 2024-10-02 LAB — BASIC METABOLIC PANEL WITH GFR
Anion gap: 7 (ref 5–15)
BUN: 31 mg/dL — ABNORMAL HIGH (ref 8–23)
CO2: 31 mmol/L (ref 22–32)
Calcium: 8.5 mg/dL — ABNORMAL LOW (ref 8.9–10.3)
Chloride: 99 mmol/L (ref 98–111)
Creatinine, Ser: 0.77 mg/dL (ref 0.44–1.00)
GFR, Estimated: >60 mL/min
Glucose, Bld: 45 mg/dL — ABNORMAL LOW (ref 70–99)
Potassium: 3 mmol/L — ABNORMAL LOW (ref 3.5–5.1)
Sodium: 138 mmol/L (ref 135–145)

## 2024-10-02 LAB — CBC
HCT: 23.4 % — ABNORMAL LOW (ref 36.0–46.0)
Hemoglobin: 7 g/dL — ABNORMAL LOW (ref 12.0–15.0)
MCH: 24.6 pg — ABNORMAL LOW (ref 26.0–34.0)
MCHC: 29.9 g/dL — ABNORMAL LOW (ref 30.0–36.0)
MCV: 82.1 fL (ref 80.0–100.0)
Platelets: 394 K/uL (ref 150–400)
RBC: 2.85 MIL/uL — ABNORMAL LOW (ref 3.87–5.11)
RDW: 16.8 % — ABNORMAL HIGH (ref 11.5–15.5)
WBC: 13.6 K/uL — ABNORMAL HIGH (ref 4.0–10.5)
nRBC: 0 % (ref 0.0–0.2)

## 2024-10-02 LAB — GLUCOSE, CAPILLARY
Glucose-Capillary: 150 mg/dL — ABNORMAL HIGH (ref 70–99)
Glucose-Capillary: 152 mg/dL — ABNORMAL HIGH (ref 70–99)
Glucose-Capillary: 190 mg/dL — ABNORMAL HIGH (ref 70–99)
Glucose-Capillary: 51 mg/dL — ABNORMAL LOW (ref 70–99)
Glucose-Capillary: 86 mg/dL (ref 70–99)
Glucose-Capillary: 88 mg/dL (ref 70–99)
Glucose-Capillary: 91 mg/dL (ref 70–99)

## 2024-10-02 LAB — PREPARE RBC (CROSSMATCH)

## 2024-10-02 SURGERY — EGD (ESOPHAGOGASTRODUODENOSCOPY)
Anesthesia: Monitor Anesthesia Care

## 2024-10-02 MED ORDER — SODIUM CHLORIDE 0.9% IV SOLUTION: Freq: Once | INTRAVENOUS | Status: AC

## 2024-10-02 MED ORDER — SODIUM CHLORIDE 0.9 % IV SOLN: INTRAVENOUS | Status: DC

## 2024-10-02 MED ORDER — SODIUM CHLORIDE 0.9 % IV SOLN
1.0000 g | INTRAVENOUS | Status: DC
  Administered 2024-10-02: 1 g via INTRAVENOUS
  Filled 2024-10-02: qty 10

## 2024-10-02 MED ORDER — PROPOFOL 10 MG/ML IV BOLUS
INTRAVENOUS | Status: DC | PRN
  Administered 2024-10-02 (×2): 50 mg via INTRAVENOUS

## 2024-10-02 MED ORDER — INSULIN ASPART 100 UNIT/ML IJ SOLN
0.0000 [IU] | INTRAMUSCULAR | Status: DC
  Administered 2024-10-02: 1 [IU] via SUBCUTANEOUS
  Administered 2024-10-03: 2 [IU] via SUBCUTANEOUS
  Administered 2024-10-03 – 2024-10-04 (×3): 1 [IU] via SUBCUTANEOUS
  Filled 2024-10-02 (×4): qty 1

## 2024-10-02 MED ORDER — LACTATED RINGERS IV SOLN: INTRAVENOUS | Status: DC

## 2024-10-02 MED ORDER — ORAL CARE MOUTH RINSE: 15.0000 mL | OROMUCOSAL | Status: DC | PRN

## 2024-10-02 MED ORDER — LIDOCAINE HCL (CARDIAC) PF 100 MG/5ML IV SOSY
PREFILLED_SYRINGE | INTRAVENOUS | Status: DC | PRN
  Administered 2024-10-02: 50 mg via INTRAVENOUS

## 2024-10-02 MED ORDER — ETOMIDATE 2 MG/ML IV SOLN
INTRAVENOUS | Status: DC | PRN
  Administered 2024-10-02 (×2): 5 mg via INTRAVENOUS

## 2024-10-02 NOTE — Plan of Care (Signed)
 " Problem: Education: Goal: Knowledge of General Education information will improve Description: Including pain rating scale, medication(s)/side effects and non-pharmacologic comfort measures 10/02/2024 2248 by Lennie Rodgers BIRCH, RN Outcome: Progressing 10/02/2024 2248 by Lennie Rodgers BIRCH, RN Outcome: Progressing   Problem: Health Behavior/Discharge Planning: Goal: Ability to manage health-related needs will improve 10/02/2024 2248 by Lennie Rodgers BIRCH, RN Outcome: Progressing 10/02/2024 2248 by Lennie Rodgers BIRCH, RN Outcome: Progressing   Problem: Clinical Measurements: Goal: Ability to maintain clinical measurements within normal limits will improve 10/02/2024 2248 by Lennie Rodgers BIRCH, RN Outcome: Progressing 10/02/2024 2248 by Lennie Rodgers BIRCH, RN Outcome: Progressing Goal: Will remain free from infection 10/02/2024 2248 by Lennie Rodgers BIRCH, RN Outcome: Progressing 10/02/2024 2248 by Lennie Rodgers BIRCH, RN Outcome: Progressing Goal: Diagnostic test results will improve 10/02/2024 2248 by Lennie Rodgers BIRCH, RN Outcome: Progressing 10/02/2024 2248 by Lennie Rodgers BIRCH, RN Outcome: Progressing Goal: Respiratory complications will improve 10/02/2024 2248 by Lennie Rodgers BIRCH, RN Outcome: Progressing 10/02/2024 2248 by Lennie Rodgers BIRCH, RN Outcome: Progressing Goal: Cardiovascular complication will be avoided 10/02/2024 2248 by Lennie Rodgers BIRCH, RN Outcome: Progressing 10/02/2024 2248 by Lennie Rodgers BIRCH, RN Outcome: Progressing   Problem: Activity: Goal: Risk for activity intolerance will decrease 10/02/2024 2248 by Lennie Rodgers BIRCH, RN Outcome: Progressing 10/02/2024 2248 by Lennie Rodgers BIRCH, RN Outcome: Progressing   Problem: Nutrition: Goal: Adequate nutrition will be maintained 10/02/2024 2248 by Lennie Rodgers BIRCH, RN Outcome: Progressing 10/02/2024 2248 by Lennie Rodgers BIRCH, RN Outcome: Progressing   Problem: Coping: Goal: Level of anxiety will decrease 10/02/2024  2248 by Lennie Rodgers BIRCH, RN Outcome: Progressing 10/02/2024 2248 by Lennie Rodgers BIRCH, RN Outcome: Progressing   Problem: Elimination: Goal: Will not experience complications related to bowel motility 10/02/2024 2248 by Lennie Rodgers BIRCH, RN Outcome: Progressing 10/02/2024 2248 by Lennie Rodgers BIRCH, RN Outcome: Progressing Goal: Will not experience complications related to urinary retention 10/02/2024 2248 by Lennie Rodgers BIRCH, RN Outcome: Progressing 10/02/2024 2248 by Lennie Rodgers BIRCH, RN Outcome: Progressing   Problem: Pain Managment: Goal: General experience of comfort will improve and/or be controlled 10/02/2024 2248 by Lennie Rodgers BIRCH, RN Outcome: Progressing 10/02/2024 2248 by Lennie Rodgers BIRCH, RN Outcome: Progressing   Problem: Safety: Goal: Ability to remain free from injury will improve 10/02/2024 2248 by Lennie Rodgers BIRCH, RN Outcome: Progressing 10/02/2024 2248 by Lennie Rodgers BIRCH, RN Outcome: Progressing   Problem: Skin Integrity: Goal: Risk for impaired skin integrity will decrease 10/02/2024 2248 by Lennie Rodgers BIRCH, RN Outcome: Progressing 10/02/2024 2248 by Lennie Rodgers BIRCH, RN Outcome: Progressing   Problem: Education: Goal: Ability to describe self-care measures that may prevent or decrease complications (Diabetes Survival Skills Education) will improve 10/02/2024 2248 by Lennie Rodgers BIRCH, RN Outcome: Progressing 10/02/2024 2248 by Lennie Rodgers BIRCH, RN Outcome: Progressing Goal: Individualized Educational Video(s) 10/02/2024 2248 by Lennie Rodgers BIRCH, RN Outcome: Progressing 10/02/2024 2248 by Lennie Rodgers BIRCH, RN Outcome: Progressing   Problem: Coping: Goal: Ability to adjust to condition or change in health will improve 10/02/2024 2248 by Lennie Rodgers BIRCH, RN Outcome: Progressing 10/02/2024 2248 by Lennie Rodgers BIRCH, RN Outcome: Progressing   Problem: Fluid Volume: Goal: Ability to maintain a balanced intake and output will improve 10/02/2024  2248 by Lennie Rodgers BIRCH, RN Outcome: Progressing 10/02/2024 2248 by Lennie Rodgers BIRCH, RN Outcome: Progressing   Problem: Health Behavior/Discharge Planning: Goal: Ability to identify and utilize available resources and services will improve 10/02/2024 2248 by Lennie Rodgers BIRCH, RN Outcome: Progressing 10/02/2024 2248  by Lennie Rodgers BIRCH, RN Outcome: Progressing Goal: Ability to manage health-related needs will improve 10/02/2024 2248 by Lennie Rodgers BIRCH, RN Outcome: Progressing 10/02/2024 2248 by Lennie Rodgers BIRCH, RN Outcome: Progressing   Problem: Metabolic: Goal: Ability to maintain appropriate glucose levels will improve 10/02/2024 2248 by Lennie Rodgers BIRCH, RN Outcome: Progressing 10/02/2024 2248 by Lennie Rodgers BIRCH, RN Outcome: Progressing   Problem: Nutritional: Goal: Maintenance of adequate nutrition will improve 10/02/2024 2248 by Lennie Rodgers BIRCH, RN Outcome: Progressing 10/02/2024 2248 by Lennie Rodgers BIRCH, RN Outcome: Progressing Goal: Progress toward achieving an optimal weight will improve 10/02/2024 2248 by Lennie Rodgers BIRCH, RN Outcome: Progressing 10/02/2024 2248 by Lennie Rodgers BIRCH, RN Outcome: Progressing   Problem: Skin Integrity: Goal: Risk for impaired skin integrity will decrease 10/02/2024 2248 by Lennie Rodgers BIRCH, RN Outcome: Progressing 10/02/2024 2248 by Lennie Rodgers BIRCH, RN Outcome: Progressing   Problem: Tissue Perfusion: Goal: Adequacy of tissue perfusion will improve 10/02/2024 2248 by Lennie Rodgers BIRCH, RN Outcome: Progressing 10/02/2024 2248 by Lennie Rodgers BIRCH, RN Outcome: Progressing   "

## 2024-10-02 NOTE — Plan of Care (Signed)

## 2024-10-02 NOTE — Brief Op Note (Signed)
 Patient underwent EGD under propofol  sedation.  Tolerated the procedure adequately.   FINDINGS:  - Esophagogastric landmarks identified. - 10 cm hiatal hernia with a few Cameron ulcers. - A medium amount of food ( residue) in the stomach. - Normal duodenal bulb and second portion of the duodenum. - No specimens collected. - Examination of the stomach body was inadequate due to presence of food ; although there was no evidence of old or active bleeding throughout the exam  RECOMMENDATIONS  -Restart diet as tolerated  -Consider IV iron  loading while inpatient and patient to follow up with hematology as outpatient for severe iron  deficiency anemia  -PO iron  q48 hours and PPI daily  -Follow up in GI clinic and depending on response to iron  supplementation may recommend further endoscopic intervention : EGD/ Colonoscopy + / - capsule If continues to be severly iron  deficient despite above would recommend surgical evaluation for large symptomatic hiatal hernia.  I spoke to patient daughter and husband over the phone .  Cesilia Shinn Faizan Alta Goding, MD Gastroenterology and Hepatology [facility]

## 2024-10-02 NOTE — Anesthesia Preprocedure Evaluation (Signed)
"                                    Anesthesia Evaluation  Patient identified by MRN, date of birth, ID band Patient awake    Reviewed: Allergy & Precautions, H&P , NPO status , Patient's Chart, lab work & pertinent test results, reviewed documented beta blocker date and time   History of Anesthesia Complications (+) PONV and history of anesthetic complications  Airway Mallampati: II  TM Distance: >3 FB Neck ROM: full    Dental no notable dental hx.    Pulmonary neg pulmonary ROS, shortness of breath, sleep apnea , pneumonia   Pulmonary exam normal breath sounds clear to auscultation       Cardiovascular Exercise Tolerance: Good hypertension, + angina  + CAD  negative cardio ROS  Rhythm:regular Rate:Normal     Neuro/Psych  PSYCHIATRIC DISORDERS Anxiety Depression     Neuromuscular disease negative neurological ROS  negative psych ROS   GI/Hepatic negative GI ROS, Neg liver ROS, hiatal hernia, PUD,GERD  ,,  Endo/Other  negative endocrine ROSdiabetesHypothyroidism    Renal/GU Renal diseasenegative Renal ROS  negative genitourinary   Musculoskeletal  (+) Arthritis ,  Fibromyalgia -  Abdominal   Peds  Hematology negative hematology ROS (+) Blood dyscrasia, anemia   Anesthesia Other Findings  1. Left ventricular ejection fraction, by estimation, is 60 to 65%. The  left ventricle has normal function. The left ventricle has no regional  wall motion abnormalities. There is mild left ventricular hypertrophy.  Left ventricular diastolic parameters  are consistent with Grade I diastolic dysfunction (impaired relaxation).   2. Right ventricular systolic function is normal. The right ventricular  size is normal. Tricuspid regurgitation signal is inadequate for assessing  PA pressure.   3. Left atrial size was moderately dilated.   4. The mitral valve is abnormal. Mild mitral valve regurgitation. No  evidence of mitral stenosis.   5. The tricuspid valve is  abnormal.   6. The aortic valve is tricuspid. There is mild calcification of the  aortic valve. There is mild thickening of the aortic valve. Aortic valve  regurgitation is mild.   7. Aortic dilatation noted. There is mild dilatation of the ascending  aorta, measuring 37 mm.   8. The inferior vena cava is normal in size with greater than 50%  respiratory variability, suggesting right atrial pressure of 3 mmHg.    Reproductive/Obstetrics negative OB ROS                              Anesthesia Physical Anesthesia Plan  ASA: 4 and emergent  Anesthesia Plan: MAC   Post-op Pain Management:    Induction:   PONV Risk Score and Plan: Propofol  infusion  Airway Management Planned:   Additional Equipment:   Intra-op Plan:   Post-operative Plan:   Informed Consent: I have reviewed the patients History and Physical, chart, labs and discussed the procedure including the risks, benefits and alternatives for the proposed anesthesia with the patient or authorized representative who has indicated his/her understanding and acceptance.     Dental Advisory Given  Plan Discussed with: CRNA  Anesthesia Plan Comments:          Anesthesia Quick Evaluation  "

## 2024-10-02 NOTE — Inpatient Diabetes Management (Signed)
 Inpatient Diabetes Program Recommendations  AACE/ADA: New Consensus Statement on Inpatient Glycemic Control (2015)  Target Ranges:  Prepandial:   less than 140 mg/dL      Peak postprandial:   less than 180 mg/dL (1-2 hours)      Critically ill patients:  140 - 180 mg/dL   Lab Results  Component Value Date   GLUCAP 88 10/02/2024   HGBA1C 6.0 (H) 08/16/2024    Latest Reference Range & Units 10/01/24 16:16 10/01/24 19:19 10/02/24 07:24 10/02/24 07:47  Glucose-Capillary 70 - 99 mg/dL 75 838 (H) 51 (L) 88  (H): Data is abnormally high (L): Data is abnormally low  Diabetes history: DM2 Outpatient Diabetes medications:  Soliqua  20 units daily, Jardiance  25 mg daily, Actos 45 mg daily, Metformin 1 gm bid Current orders for Inpatient glycemic control: Novolog  0-9 units tid, 0-5 units hs  Inpatient Diabetes Program Recommendations:   Please consider: -Change Novolog  correction to 0-6 units q 4 hrs. While NPO. May need adjustment in DM meds prior to discharge.  Thank you, Abeera Flannery E. Rhylee Nunn, RN, MSN, CNS, CDCES  Diabetes Coordinator Inpatient Glycemic Control Team Team Pager [phone] (8am-5pm) 10/02/2024 9:28 AM

## 2024-10-02 NOTE — Progress Notes (Addendum)
 " PROGRESS NOTE    Jeanne Kim  FMW:[id number] DOB: 1941/08/24 DOA: 10/01/2024 PCP: Orpha Yancey LABOR, MD   Brief Narrative:   83 y.o. female with medical history significant of multiple comorbidities including type 2 diabetes mellitus, coronary artery disease, gastric ulcer, anemia, cognitive impairment, hiatal hernia, hypothyroidism, who presented to the emergency room with chief complaint of coffee-ground emesis.   Started on IV PPI 40 mg twice daily.  GI consulted.  1 unit of PRBC ordered on 10/02/2024. She has been kept n.p.o. since midnight.  Assessment & Plan:  Principal Problem:   ABLA (acute blood loss anemia)   Acute blood loss anemia, POA: -Likely upper GI bleed, POA: - In the setting of gastric ulcers and prior GI bleeds - EGD done on 08/16/2024 by Dr. Cindie showed evidence of mild Schatzki ring, large hiatal hernia with multiple Cameron ulcers, treated with bipolar cautery.  Patient had normal duodenal bulb, first portion of duodenum and second portion of duodenum at that time. - GI consulted, continue with IV Protonix  40 mg twice a day -She has been kept n.p.o. since midnight for possible EGD today -Hemoglobin this morning has dropped to 7, patient appears pale and is symptomatic so I have ordered 1 unit of PRBC. -Follow-up hemoglobin and hematocrit after the transfusion   Lactic acidosis,POA: Likely due to acute bleeding. No evidence of infection. F/u CXR-unremarkable -Lactic acid is down from 3.7-2.3    Possible acute uncomplicated UTI, POA: Patient is symptomatic with dysuria, urinary frequency and there is presence of bacteria in the urine.  I have ordered intravenous ceftriaxone  1 g every 24 hours.  Follow-up culture results.   Hyponatremia,POA: Resolved.  Likely hypovolemic. Ordered NS at 75 cc /hr. F/u BMP in am.   Type 2 diabetes mellitus with hypoglycemia: Hypoglycemia protocol in place.  Avoid any scheduled diabetic medications.   Hypothyroidism, POA:  Continue with Synthroid    Hyperlipidemia, POA: Continue with statin   Disposition: Home with husband.   DVT prophylaxis: SCDs Start: 10/01/24 1255     Code Status: Full Code Family Communication: None at the bedside Status is: Inpatient Remains inpatient appropriate because: Acute blood loss anemia    Subjective:  She feels tired and fatigued.  I spoke to her about transfusing 1 unit of PRBC as her hemoglobin has dropped to 7, she is pale and is symptomatic.  She has been kept n.p.o. since midnight.  GI evaluation pending  Examination:  General exam: Appears calm and comfortable, appear pale Respiratory system: Clear to auscultation. Respiratory effort normal. Cardiovascular system: S1 & S2 heard, RRR. No JVD, murmurs, rubs, gallops or clicks. No pedal edema. Gastrointestinal system: Abdomen is nondistended, soft and nontender. No organomegaly or masses felt. Normal bowel sounds heard. Central nervous system: Alert and oriented. No focal neurological deficits. Extremities: Symmetric 5 x 5 power. Skin: No rashes, lesions or ulcers Psychiatry: Judgement and insight appear normal. Mood & affect appropriate.      Diet Orders (From admission, onward)     Start     Ordered   10/01/24 1256  Diet clear liquid Room service appropriate? Yes; Fluid consistency: Thin  Diet effective now       Question Answer Comment  Room service appropriate? Yes   Fluid consistency: Thin      10/01/24 1256            Objective: Vitals:   10/01/24 1916 10/01/24 2100 10/02/24 0150 10/02/24 0451  BP: 134/63 134/63 (!) 133/57 (!) 109/50  Pulse: 82 82 84 84  Resp: 18  18 18   Temp: 97.8 F (36.6 C) 97.8 F (36.6 C) 97.7 F (36.5 C) 97.6 F (36.4 C)  TempSrc: Oral Oral Oral Oral  SpO2: 97% 97% 99% 97%  Weight:      Height:        Intake/Output Summary (Last 24 hours) at 10/02/2024 0901 Last data filed at 10/02/2024 0500 Gross per 24 hour  Intake 683.44 ml  Output 1200 ml  Net  -516.56 ml   Filed Weights   10/01/24 1002  Weight: 68 kg    Scheduled Meds:  sodium chloride    Intravenous Once   atorvastatin   20 mg Oral QPM   insulin  aspart  0-5 Units Subcutaneous QHS   insulin  aspart  0-9 Units Subcutaneous TID WC   levothyroxine   100 mcg Oral QAC breakfast   pantoprazole  (PROTONIX ) IV  40 mg Intravenous Q12H   phenazopyridine   100 mg Oral TID WC   sucralfate   1 g Oral TID WC & HS   Continuous Infusions:  Nutritional status     Body mass index is 24.21 kg/m.  Data Reviewed:   CBC: Recent Labs  Lab 10/01/24 1015 10/02/24 0412  WBC 13.2* 13.6*  HGB 8.5* 7.0*  HCT 28.7* 23.4*  MCV 83.2 82.1  PLT 361 394   Basic Metabolic Panel: Recent Labs  Lab 10/01/24 1015 10/02/24 0412  NA 134* 138  K 3.6 3.0*  CL 93* 99  CO2 22 31  GLUCOSE 145* 45*  BUN 43* 31*  CREATININE 0.93 0.77  CALCIUM  9.0 8.5*   GFR: Estimated Creatinine Clearance: 51.6 mL/min (by C-G formula based on SCr of 0.77 mg/dL). Liver Function Tests: Recent Labs  Lab 10/01/24 1015  AST 19  ALT 10  ALKPHOS 72  BILITOT 0.8  PROT 6.7  ALBUMIN  3.8   No results for input(s): LIPASE, AMYLASE in the last 168 hours. No results for input(s): AMMONIA in the last 168 hours. Coagulation Profile: No results for input(s): INR, PROTIME in the last 168 hours. Cardiac Enzymes: Recent Labs  Lab 10/01/24 1029  CKTOTAL 38   BNP (last 3 results) No results for input(s): PROBNP in the last 8760 hours. HbA1C: No results for input(s): HGBA1C in the last 72 hours. CBG: Recent Labs  Lab 10/01/24 1616 10/01/24 1919 10/02/24 0724 10/02/24 0747  GLUCAP 75 161* 51* 88   Lipid Profile: No results for input(s): CHOL, HDL, LDLCALC, TRIG, CHOLHDL, LDLDIRECT in the last 72 hours. Thyroid  Function Tests: No results for input(s): TSH, T4TOTAL, FREET4, T3FREE, THYROIDAB in the last 72 hours. Anemia Panel: No results for input(s): VITAMINB12, FOLATE,  FERRITIN, TIBC, IRON , RETICCTPCT in the last 72 hours. Sepsis Labs: Recent Labs  Lab 10/01/24 1115 10/01/24 1439  LATICACIDVEN 3.7* 2.3*    Recent Results (from the past 240 hours)  Resp panel by RT-PCR (RSV, Flu A&B, Covid) Anterior Nasal Swab     Status: None   Collection Time: 10/01/24 10:37 AM   Specimen: Anterior Nasal Swab  Result Value Ref Range Status   SARS Coronavirus 2 by RT PCR NEGATIVE NEGATIVE Final    Comment: (NOTE) SARS-CoV-2 target nucleic acids are NOT DETECTED.  The SARS-CoV-2 RNA is generally detectable in upper respiratory specimens during the acute phase of infection. The lowest concentration of SARS-CoV-2 viral copies this assay can detect is 138 copies/mL. A negative result does not preclude SARS-Cov-2 infection and should not be used as the sole basis for treatment or other  patient management decisions. A negative result may occur with  improper specimen collection/handling, submission of specimen other than nasopharyngeal swab, presence of viral mutation(s) within the areas targeted by this assay, and inadequate number of viral copies(<138 copies/mL). A negative result must be combined with clinical observations, patient history, and epidemiological information. The expected result is Negative.  Fact Sheet for Patients:  bloggercourse.com  Fact Sheet for Healthcare Providers:  seriousbroker.it  This test is no t yet approved or cleared by the [place]  FDA and  has been authorized for detection and/or diagnosis of SARS-CoV-2 by FDA under an Emergency Use Authorization (EUA). This EUA will remain  in effect (meaning this test can be used) for the duration of the COVID-19 declaration under Section 564(b)(1) of the Act, 21 U.S.C.section 360bbb-3(b)(1), unless the authorization is terminated  or revoked sooner.       Influenza A by PCR NEGATIVE NEGATIVE Final   Influenza B by PCR  NEGATIVE NEGATIVE Final    Comment: (NOTE) The Xpert Xpress SARS-CoV-2/FLU/RSV plus assay is intended as an aid in the diagnosis of influenza from Nasopharyngeal swab specimens and should not be used as a sole basis for treatment. Nasal washings and aspirates are unacceptable for Xpert Xpress SARS-CoV-2/FLU/RSV testing.  Fact Sheet for Patients: bloggercourse.com  Fact Sheet for Healthcare Providers: seriousbroker.it  This test is not yet approved or cleared by the [place]  FDA and has been authorized for detection and/or diagnosis of SARS-CoV-2 by FDA under an Emergency Use Authorization (EUA). This EUA will remain in effect (meaning this test can be used) for the duration of the COVID-19 declaration under Section 564(b)(1) of the Act, 21 U.S.C. section 360bbb-3(b)(1), unless the authorization is terminated or revoked.     Resp Syncytial Virus by PCR NEGATIVE NEGATIVE Final    Comment: (NOTE) Fact Sheet for Patients: bloggercourse.com  Fact Sheet for Healthcare Providers: seriousbroker.it  This test is not yet approved or cleared by the [place]  FDA and has been authorized for detection and/or diagnosis of SARS-CoV-2 by FDA under an Emergency Use Authorization (EUA). This EUA will remain in effect (meaning this test can be used) for the duration of the COVID-19 declaration under Section 564(b)(1) of the Act, 21 U.S.C. section 360bbb-3(b)(1), unless the authorization is terminated or revoked.  Performed at [facility], [address] [place], [place] [address]   Blood culture (routine x 2)     Status: None (Preliminary result)   Collection Time: 10/01/24 11:15 AM   Specimen: BLOOD LEFT HAND  Result Value Ref Range Status   Specimen Description   Final    BLOOD LEFT HAND BOTTLES DRAWN AEROBIC AND ANAEROBIC   Special Requests Blood Culture adequate volume  Final    Culture   Final    NO GROWTH < 24 HOURS Performed at [facility], [address], [place], [place] [address]    Report Status PENDING  Incomplete  Blood culture (routine x 2)     Status: None (Preliminary result)   Collection Time: 10/01/24 11:25 AM   Specimen: BLOOD RIGHT HAND  Result Value Ref Range Status   Specimen Description   Final    BLOOD RIGHT HAND BOTTLES DRAWN AEROBIC AND ANAEROBIC   Special Requests Blood Culture adequate volume  Final   Culture   Final    NO GROWTH < 24 HOURS Performed at [facility], [address], [place], [place] [address]    Report Status PENDING  Incomplete  Radiology Studies: DG CHEST PORT 1 VIEW Result Date: 10/01/2024 CLINICAL DATA:  Coffee ground emesis and diarrhea. EXAM: PORTABLE CHEST 1 VIEW COMPARISON:  March 08, 2024 FINDINGS: The cardiac silhouette is mildly enlarged and unchanged in size. Low lung volumes are noted. A small ill-defined patchy opacity is seen within the suprahilar region on the left. No pleural effusion or pneumothorax is identified. Postoperative changes are noted throughout the mid to lower cervical spine. Multilevel degenerative changes are seen throughout the thoracic spine. IMPRESSION: Low lung volumes with a small ill-defined patchy opacity within the suprahilar region on the left which may represent a small area of atelectasis and/or infiltrate. Follow-up to resolution is recommended to exclude the presence of an underlying neoplastic process. Electronically Signed   By: Suzen Dials M.D.   On: 10/01/2024 13:56           LOS: 1 day   Time spent= 35 mins    Deliliah Room, MD [facility]  If 7PM-7AM, please contact night-coverage  10/02/2024, 9:01 AM  "

## 2024-10-02 NOTE — Plan of Care (Signed)
   Problem: Education: Goal: Knowledge of General Education information will improve Description: Including pain rating scale, medication(s)/side effects and non-pharmacologic comfort measures Outcome: Progressing   Problem: Clinical Measurements: Goal: Ability to maintain clinical measurements within normal limits will improve Outcome: Progressing Goal: Diagnostic test results will improve Outcome: Progressing

## 2024-10-02 NOTE — Op Note (Signed)
 [facility] Patient Name: Jeanne Kim Procedure Date: 10/02/2024 11:10 AM MRN: [id number] Date of Birth: [birth year] Attending MD: Deatrice Dine , MD, [id number] CSN: [id number] Age: 83 Admit Type: Inpatient Procedure:                Upper GI endoscopy Indications:              Acute post hemorrhagic anemia, Coffee-ground emesis Providers:                Deatrice Dine, MD, Devere Lodge, Bascom Blush Referring MD:              Medicines:                Monitored Anesthesia Care Complications:            No immediate complications. Estimated Blood Loss:     Estimated blood loss: none. Procedure:                Pre-Anesthesia Assessment:                           - Prior to the procedure, a History and Physical                            was performed, and patient medications and                            allergies were reviewed. The patient's tolerance of                            previous anesthesia was also reviewed. The risks                            and benefits of the procedure and the sedation                            options and risks were discussed with the patient.                            All questions were answered, and informed consent                            was obtained. Prior Anticoagulants: The patient has                            taken no anticoagulant or antiplatelet agents. ASA                            Grade Assessment: III - A patient with severe                            systemic disease. After reviewing the risks and                            benefits, the patient was deemed in satisfactory  condition to undergo the procedure.                           After obtaining informed consent, the endoscope was                            passed under direct vision. Throughout the                            procedure, the patient's blood pressure, pulse, and                            oxygen saturations were monitored  continuously. The                            Endoscope was introduced through the mouth, and                            advanced to the second part of duodenum. The upper                            GI endoscopy was somewhat difficult due to presence                            of food. The patient tolerated the procedure well. Scope In: 11:36:47 AM Scope Out: 11:39:32 AM Total Procedure Duration: 0 hours 2 minutes 45 seconds  Findings:      Esophagogastric landmarks were identified: the Z-line was found at 29 cm       and the site of hiatal narrowing was found at 39 cm from the incisors.      A 10 cm hiatal hernia with a few Cameron ulcers was found.      A medium amount of food (residue) was found in the entire examined       stomach.      There is no endoscopic evidence of bleeding in the entire examined       stomach.      The duodenal bulb and second portion of the duodenum were normal. Impression:               - Esophagogastric landmarks identified.                           - 10 cm hiatal hernia with a few Cameron ulcers.                           - A medium amount of food (residue) in the stomach.                           - Normal duodenal bulb and second portion of the                            duodenum.                           - No specimens collected.                           -  Examination of the stomach body was inadequate due                            to presence of food ; although there was no                            evidence of old or active bleeding throughout the                            exam Moderate Sedation:      Per Anesthesia Care Recommendation:           Restart diet as tolerated                           Consider IV iron  loading while inpatient and                            patient to follow up with hematology as outpatient                            for severe iron  deficiency anemia                           PO iron  q48 hours and PPI daily                            Follow up in Gi clinic and depending on response to                            iron  supplementation may recommend further                            endoscopic intervention : EGD/Colonoscopy +/-                            capsule                           If continues to be severly iron  deficient despite                            above would recommend surgical evaluation for large                            symptomatic hiatal hernia Procedure Code(s):        --- Professional ---                           56764, Esophagogastroduodenoscopy, flexible,                            transoral; diagnostic, including collection of                            specimen(s) by brushing or washing, when performed                            (  separate procedure) Diagnosis Code(s):        --- Professional ---                           K44.9, Diaphragmatic hernia without obstruction or                            gangrene                           K25.9, Gastric ulcer, unspecified as acute or                            chronic, without hemorrhage or perforation                           D62, Acute posthemorrhagic anemia                           K92.0, Hematemesis CPT copyright 2022 American Medical Association. All rights reserved. The codes documented in this report are preliminary and upon coder review may  be revised to meet current compliance requirements. Deatrice Dine, MD Deatrice Dine, MD 10/02/2024 11:47:16 AM This report has been signed electronically. Number of Addenda: 0

## 2024-10-02 NOTE — Anesthesia Postprocedure Evaluation (Signed)
"   Anesthesia Post Note  Patient: Jeanne Kim  Procedure(s) Performed: EGD (ESOPHAGOGASTRODUODENOSCOPY)  Patient location during evaluation: Phase II Anesthesia Type: MAC Level of consciousness: awake Pain management: pain level controlled Vital Signs Assessment: post-procedure vital signs reviewed and stable Respiratory status: spontaneous breathing and respiratory function stable Cardiovascular status: blood pressure returned to baseline and stable Postop Assessment: no headache and no apparent nausea or vomiting Anesthetic complications: no Comments: Late entry   No notable events documented.   Last Vitals:  Vitals:   10/02/24 1200 10/02/24 1202  BP: (!) 102/46 (!) 102/46  Pulse: 81 81  Resp: 17 18  Temp:    SpO2: 97% 97%    Last Pain:  Vitals:   10/02/24 1143  TempSrc:   PainSc: Asleep                 Yvonna PARAS Rowdy Guerrini      "

## 2024-10-02 NOTE — TOC Initial Note (Signed)
 Transition of Care [facility]) - Initial/Assessment Note    Patient Details  Name: LASHAYLA ARMES MRN: [id number] Date of Birth: 25-Aug-1941  Transition of Care [facility]) CM/SW Contact:    Hoy DELENA Bigness, LCSW Phone Number: 10/02/2024, 10:44 AM  Clinical Narrative:                 Pt assessed due to high risk for readmission. Pt is from home with spouse. Pt ambulates with a RW at baseline. Pt does not currently have any in home services. Pt's children transport pt to appointments and at times will use [facility] if children unable. ICM will continue to follow for discharge needs.   Expected Discharge Plan: Home/Self Care Barriers to Discharge: Continued Medical Work up   Patient Goals and CMS Choice Patient states their goals for this hospitalization and ongoing recovery are:: For pt to return home          Expected Discharge Plan and Services In-house Referral: Clinical Social Work Discharge Planning Services: NA Post Acute Care Choice: NA Living arrangements for the past 2 months: Single Family Home                                      Prior Living Arrangements/Services Living arrangements for the past 2 months: Single Family Home Lives with:: Spouse Patient language and need for interpreter reviewed:: Yes Do you feel safe going back to the place where you live?: Yes      Need for Family Participation in Patient Care: Yes (Comment) Care giver support system in place?: No (comment) Current home services: DME (RW) Criminal Activity/Legal Involvement Pertinent to Current Situation/Hospitalization: No - Comment as needed  Activities of Daily Living   ADL Screening (condition at time of admission) Independently performs ADLs?: Yes (appropriate for developmental age) Is the patient deaf or have difficulty hearing?: No Does the patient have difficulty seeing, even when wearing glasses/contacts?: No Does the patient have difficulty concentrating, remembering, or making  decisions?: No  Permission Sought/Granted Permission sought to share information with : Family Supports Permission granted to share information with : Yes, Verbal Permission Granted              Emotional Assessment Appearance:: Appears stated age Attitude/Demeanor/Rapport: Unable to Assess Affect (typically observed): Unable to Assess Orientation: : Oriented to Self Alcohol / Substance Use: Not Applicable Psych Involvement: No (comment)  Admission diagnosis:  ABLA (acute blood loss anemia) [D62] Hematemesis, unspecified whether nausea present [K92.0] Patient Active Problem List   Diagnosis Date Noted   ABLA (acute blood loss anemia) 10/01/2024   Upper GI bleeding 08/16/2024   Acute on chronic anemia 08/15/2024   Vitamin B12 deficiency 03/09/2024   Fracture of humeral shaft, closed 11/08/2022   Trochanteric fracture of left femur (HCC) 10/30/2022   Chronic constipation 05/23/2022   Aortic atherosclerosis 05/23/2022   Hypertension associated with type 2 diabetes mellitus (HCC) 05/23/2022   Hypotension 05/23/2022   Cognitive impairment 05/23/2022   SIRS (systemic inflammatory response syndrome) (HCC) 05/17/2022   Acute metabolic encephalopathy 05/16/2022   Generalized weakness 05/04/2021   Hypoalbuminemia due to protein-calorie malnutrition 05/04/2021   Hyperlipidemia 05/04/2021   Prolonged QT interval 05/04/2021   Iron  deficiency anemia due to chronic blood loss 12/23/2020   GI bleed 12/20/2020   Hypokalemia due to excessive gastrointestinal loss of potassium 10/31/2020   Hyperbilirubinemia 10/31/2020   Thrombocytosis 10/31/2020   Abdominal pain 10/04/2020  Fibromyalgia 10/04/2020   Proctocolitis 10/04/2020   Gastric ulcer 10/04/2020   History of gastrointestinal bleeding 10/04/2020   Intracranial bleed (HCC) 09/28/2020   AKI (acute kidney injury) 09/28/2020   Upper GI bleed 08/05/2020   Hypokalemia 03/25/2020   Hypomagnesemia 03/22/2020   Insomnia 03/22/2020    MDD (major depressive disorder) 03/22/2020   Status post total replacement of left hip 03/21/2020   S/P musculoskeletal system surgery 03/21/2020   At risk for falls 02/24/2020   Chronic, continuous use of opioids 02/24/2020   Senile osteoporosis 02/24/2020   History of radiation therapy 02/24/2020   Leukemoid reaction 02/06/2020   Symptomatic anemia 02/05/2020   Electrolyte abnormality 02/05/2020   Bilateral lower extremity edema 02/04/2020   Pathological fracture of left hip due to age-related osteoporosis (HCC) 02/04/2020   History of fracture of left hip 02/03/2020   Malignant neoplasm of breast (HCC) 07/02/2013   Occlusion and stenosis of carotid artery without mention of cerebral infarction 04/28/2013   Carotid stenosis 01/20/2013   Autonomic orthostatic hypotension 10/17/2012   Postural hypotension 10/17/2012   Dyspnea 08/27/2012   Syncope 02/17/2012   History of colonic polyps 12/12/2011   Diverticulosis of colon 12/12/2011   Gastritis 12/12/2011   GERD with stricture 12/12/2011   Gastroesophageal reflux disease 11/27/2011   IBS (irritable bowel syndrome) 11/27/2011   S/P cholecystectomy 11/27/2011   Chest pain 12/09/2009   History of breast cancer 12/09/2009   Hypothyroidism 04/27/2009   OVERWEIGHT/OBESITY 04/07/2009   Essential hypertension 04/07/2009   CAD (coronary artery disease) 04/07/2009   Irritable bowel syndrome 04/07/2009   DEGENERATIVE DISC DISEASE 04/07/2009   Uncontrolled type 2 diabetes mellitus with hyperglycemia, with long-term current use of insulin  (HCC) 05/04/2008   Thyroid  nodule 05/04/2008   PCP:  Orpha Yancey LABOR, MD Pharmacy:   [facility] - [place], [place] - [address] [place] [address] Phone: [phone] Fax: [phone]  [facility] - [place], [place] - [address] [place] [address] Phone: [phone] Fax: [phone]  [facility] [address] [place] [address] Phone: [phone] Fax: [phone]     Social Drivers of Health (SDOH) Social History: SDOH Screenings   Food Insecurity: No Food Insecurity (10/02/2024)  Recent Concern: Food Insecurity - Food Insecurity Present (10/01/2024)  Housing: Low Risk (10/01/2024)  Transportation Needs: No Transportation Needs (10/02/2024)  Recent Concern: Transportation Needs - Unmet Transportation Needs (10/01/2024)  Utilities: Not At Risk (10/01/2024)  Financial Resource Strain: Low Risk (10/19/2022)   Received from [facility]  Social Connections: Socially Integrated (10/01/2024)  Tobacco Use: Low Risk (10/02/2024)   SDOH Interventions: Food Insecurity Interventions: Intervention Not Indicated, Inpatient TOC Transportation Interventions: Inpatient TOC, Intervention Not Indicated, Patient Resources (Friends/Family)   Readmission Risk Interventions    10/02/2024   10:43 AM 08/18/2024   10:41 AM 09/13/2022    1:16 PM  Readmission Risk Prevention Plan  Transportation Screening Complete Complete Complete  HRI or Home Care Consult  Complete Complete  Social Work Consult for Recovery Care Planning/Counseling  Complete Complete  Palliative Care Screening  Not Applicable Not Applicable  Medication Review Oceanographer) Complete Complete Complete  PCP or Specialist appointment within 3-5 days of discharge Complete    HRI or Home Care Consult Complete    SW Recovery Care/Counseling Consult Complete    Palliative Care Screening Not Applicable    Skilled Nursing Facility Not Applicable

## 2024-10-02 NOTE — Consult Note (Signed)
 Griffin Gerrard Faizan Jeren Dufrane, M.D. Gastroenterology & Hepatology                                           Patient Name: NEWELL FRATER Account #: @[id number] @   MRN: [id number] Admission Date: 10/01/2024 Date of Evaluation:  10/02/2024 Time of Evaluation: 7:59 AM   Chief Complaint:  coffee ground emesis  HPI:  This is a 83 y.o. female with history of DM, CAD, Cognitive impairment , large hiatal hernia , hypothyroidism presented with coffee ground emesis . GI is consulted for coffee-ground emesis concerning upper GI bleed  Patient had similar presentation last month where she underwent upper endoscopy found to have large hiatal hernia and Ole lesions which was treated with bipolar cautery  Patient is seen and examined and appears confused.  She knows where she is currently present but does not know what happened to her.  I called patient husband Prentice over the phone who reports patient was throwing up black color vomitus.  Husband denies patient taking any NSAIDs but previously she was taking Aleve  Labs with potassium 3.0 Hemoglobin 8.5 dropped to 7.  Last 1 was 5.9 where she underwent upper endoscopy  03/2024 ferritin 4 EGD 08/2024  - Mild Schatzki ring. - Large hiatal hernia with multiple Cameron ulcers. Treated with bipolar cautery. - Normal duodenal bulb, first portion of the duodenum and second portion of the duodenum. - No specimens collected.  03/2024 enteroscopy  - Tortuous esophagus. - Erythematous mucosa in the antrum. Biopsied. - Gastritis. Biopsied. No definite Ole' s erosions. Gastritis was present in the hiatal hernia sac. This is a potential source of recent bleeding that could explain the patient' s anemia. - Normal gastric body and cardia. - Multiple gastric polyps. - Medium- sized hiatal hernia. - Normal examined duodenum. - The examined portion of the jejunum was normal.  2022 - Hemorrhoids found on perianal exam. - Non- bleeding internal hemorrhoids. - Diverticulosis  in the sigmoid colon. - Distal sigmoid ulceration. Suspicious for ischemic colitis. Biopsied. - The examination was otherwise normal on direct and retroflexion views.  Past Medical History: SEE CHRONIC ISSSUES: Past Medical History:  Diagnosis Date   Allergy    Anemia    Anginal pain    Anxiety    Arthritis    Blood transfusion without reported diagnosis    Breast cancer (HCC)    Carotid artery plaque    bilat   Cataract    Colon polyps    adenomatous   Concussion 02/2016   Driving restrictions for 6w   Coronary heart disease    Depression    Diverticulosis    Dizziness    Esophageal reflux    Esophageal stricture    Fibromyalgia    Gallstones    Hiatal hernia    Hypercholesterolemia    Hypertension    Hypothyroidism    IBS (irritable bowel syndrome)    Kidney stones    Osteoporosis    Pneumonia    recently, just finished ABO   PONV (postoperative nausea and vomiting)    Sleep apnea    CPAP   Thyroid  disease 2002   Type II or unspecified type diabetes mellitus without mention of complication, not stated as uncontrolled    Unspecified gastritis and gastroduodenitis without mention of hemorrhage    Past Surgical History:  Past Surgical History:  Procedure Laterality Date  ANAL RECTAL MANOMETRY N/A 08/05/2015   Procedure: ANO RECTAL MANOMETRY;  Surgeon: Gustav Shila GAILS, MD;  Location: [facility];  Service: Endoscopy;  Laterality: N/A;   APPENDECTOMY     BIOPSY  08/06/2020   Procedure: BIOPSY;  Surgeon: Cindie Carlin POUR, DO;  Location: [facility];  Service: Endoscopy;;   BIOPSY  12/21/2020   Procedure: BIOPSY;  Surgeon: Eda Iha, MD;  Location: [facility];  Service: Gastroenterology;;   BIOPSY  12/23/2020   Procedure: BIOPSY;  Surgeon: Eda Iha, MD;  Location: [facility];  Service: Gastroenterology;;   BREAST LUMPECTOMY     left   CARDIAC CATHETERIZATION     CATARACT EXTRACTION W/PHACO Left 03/30/2013   Procedure: CATARACT EXTRACTION  PHACO AND INTRAOCULAR LENS PLACEMENT (IOC);  Surgeon: Cherene Mania, MD;  Location: [facility];  Service: Ophthalmology;  Laterality: Left;  CDE:19.28   CATARACT EXTRACTION W/PHACO Right 04/09/2013   Procedure: CATARACT EXTRACTION PHACO AND INTRAOCULAR LENS PLACEMENT (IOC);  Surgeon: Cherene Mania, MD;  Location: [facility];  Service: Ophthalmology;  Laterality: Right;  CDE: 15.44   CERVICAL FUSION     CESAREAN SECTION     CHOLECYSTECTOMY     COLONOSCOPY WITH PROPOFOL  N/A 12/23/2020   Procedure: COLONOSCOPY WITH PROPOFOL ;  Surgeon: Eda Iha, MD;  Location: [facility];  Service: Gastroenterology;  Laterality: N/A;   CYSTOCELE REPAIR     DILATION AND CURETTAGE OF UTERUS     ENTEROSCOPY N/A 03/10/2024   Procedure: ENTEROSCOPY;  Surgeon: Suzann Inocente HERO, MD;  Location: [facility] END[facility]COPY;  Service: Gastroenterology;  Laterality: N/A;   ESOPHAGOGASTRODUODENOSCOPY N/A 08/16/2024   Procedure: EGD (ESOPHAGOGASTRODUODENOSCOPY);  Surgeon: Cindie Carlin POUR, DO;  [facility];  Service: Endoscopy;  Laterality: N/A;   ESOPHAGOGASTRODUODENOSCOPY (EGD) WITH PROPOFOL  N/A 08/06/2020   Procedure: ESOPHAGOGASTRODUODENOSCOPY (EGD) WITH PROPOFOL ;  Surgeon: Cindie Carlin POUR, DO;  [facility];  Service: Endoscopy;  Laterality: N/A;   ESOPHAGOGASTRODUODENOSCOPY (EGD) WITH PROPOFOL  N/A 12/21/2020   Procedure: ESOPHAGOGASTRODUODENOSCOPY (EGD) WITH PROPOFOL ;  Surgeon: Eda Iha, MD;  Location: [facility];  Service: Gastroenterology;  Laterality: N/A;   EYE SURGERY Left 03-30-13   Cataract   EYE SURGERY Right 04-09-13   Cataract   RECTOCELE REPAIR     THYROIDECTOMY     TONSILLECTOMY     TOTAL ABDOMINAL HYSTERECTOMY     Family History:  Family History  Problem Relation Age of Onset   Sarcoidosis Mother    Diabetes Mother    Cancer Mother        sarcoma -  Right arm amputation   Hypertension Mother    Lung cancer Father    Diabetes Father    Heart disease Father        Before age 59    Urolithiasis Father    Cancer Father        Lung   Hyperlipidemia Father    Hypertension Father    Esophageal cancer Maternal Uncle    Ovarian cancer Maternal Aunt    Colon cancer Maternal Uncle    Lung cancer Maternal Uncle    Diabetes Maternal Uncle    Diabetes Maternal Aunt    Colon polyps Maternal Uncle    Heart disease Paternal Grandmother    Social History: Social History[1]  Home Medications:  Prior to Admission medications  Medication Sig Start Date End Date Taking? Authorizing Provider  ASPIRIN  LOW DOSE 81 MG tablet Take 81 mg by mouth every morning. 04/17/22   [provider]  atorvastatin  (LIPITOR) 20  MG tablet Take 20 mg by mouth every evening. 08/28/22   [provider]  doxepin  (SINEQUAN ) 10 MG capsule Take 10 mg by mouth at bedtime. 08/28/22   [provider]  DULoxetine  (CYMBALTA ) 60 MG capsule Take 60 mg by mouth daily. 02/07/24   [provider]  EPINEPHrine  0.3 mg/0.3 mL IJ SOAJ injection Inject 0.3 mg into the muscle daily as needed for anaphylaxis. 07/18/09   [provider]  ferrous sulfate  325 (65 FE) MG tablet Take 1 tablet (325 mg total) by mouth daily with breakfast. 03/12/24   Odell Celinda Balo, MD  furosemide  (LASIX ) 20 MG tablet Take 1 tablet (20 mg total) by mouth daily. 06/01/22   Landy Barnie RAMAN, NP  Insulin  Glargine-Lixisenatide  (SOLIQUA ) 100-33 UNT-MCG/ML SOPN Inject 20 Units into the skin daily. Sliding Scale 06/01/22   Landy Barnie RAMAN, NP  JARDIANCE  25 MG TABS tablet Take 1 tablet (25 mg total) by mouth daily. 06/01/22   Landy Barnie RAMAN, NP  levothyroxine  (SYNTHROID ) 100 MCG tablet Take 100 mcg by mouth daily before breakfast.    [provider]  levothyroxine  (SYNTHROID ) 125 MCG tablet Take 1 tablet (125 mcg total) by mouth daily at 6 (six) AM. Patient not taking: Reported on 08/15/2024 03/13/24   Odell Celinda Balo, MD  metFORMIN (GLUCOPHAGE) 1000 MG tablet Take 1,000 mg by mouth 2 (two) times  daily. 02/07/24   [provider]  midodrine  (PROAMATINE ) 2.5 MG tablet Take 2.5 mg by mouth 3 (three) times daily. 08/03/24   [provider]  pantoprazole  (PROTONIX ) 40 MG tablet Take 1 tablet (40 mg total) by mouth 2 (two) times daily. 08/18/24   Bryn Bernardino NOVAK, MD  pioglitazone (ACTOS) 45 MG tablet Take 45 mg by mouth daily. 02/07/24   [provider]  polyethylene glycol powder (GLYCOLAX /MIRALAX ) 17 GM/SCOOP powder Take 17 g by mouth daily. 03/12/24   Odell Celinda Balo, MD  potassium chloride  SA (KLOR-CON  M) 20 MEQ tablet Take 1 tablet (20 mEq total) by mouth daily. 09/16/22   Shahmehdi, Adriana LABOR, MD  sucralfate  (CARAFATE ) 1 GM/10ML suspension Take 10 mLs (1 g total) by mouth 4 (four) times daily -  with meals and at bedtime for 14 days. 08/18/24 09/01/24  Bryn Bernardino NOVAK, MD    Inpatient Medications: Current Medications[2] Allergies: Codeine, Sulfonamide derivatives, Canagliflozin, Penicillins, and Sulfa antibiotics  Complete Review of Systems: GENERAL: negative for malaise, night sweats HEENT: No changes in hearing or vision, no nose bleeds or other nasal problems. NECK: Negative for lumps, goiter, pain and significant neck swelling RESPIRATORY: Negative for cough, wheezing CARDIOVASCULAR: Negative for chest pain, leg swelling, palpitations, orthopnea GI: SEE HPI MUSCULOSKELETAL: Negative for joint pain or swelling, back pain, and muscle pain. SKIN: Negative for lesions, rash PSYCH: Negative for sleep disturbance, mood disorder and recent psychosocial stressors. HEMATOLOGY Negative for prolonged bleeding, bruising easily, and swollen nodes. ENDOCRINE: Negative for cold or heat intolerance, polyuria, polydipsia and goiter. NEURO: negative for tremor, gait imbalance, syncope and seizures. The remainder of the review of systems is noncontributory.  Physical Exam: BP (!) 109/50   Pulse 84   Temp 97.6 F (36.4 C) (Oral)   Resp 18   Ht 5' 6 (1.676 m)   Wt 68 kg    SpO2 97%   BMI 24.21 kg/m  GENERAL: The patient is AO x1, in no acute distress. HEENT: Head is normocephalic and atraumatic. EOMI are intact. Mouth is well hydrated and without lesions. NECK: Supple. No masses LUNGS:  Clear to auscultation. No presence of rhonchi/wheezing/rales. Adequate chest expansion HEART: RRR, normal s1 and s2. ABDOMEN: Soft, nontender, no guarding, no peritoneal signs, and nondistended. BS +. No masses.  Laboratory Data CBC:     Component Value Date/Time   WBC 13.6 (H) 10/02/2024 0412   RBC 2.85 (L) 10/02/2024 0412   HGB 7.0 (L) 10/02/2024 0412   HGB 8.9 (L) 07/04/2020 1524   HCT 23.4 (L) 10/02/2024 0412   HCT 29.6 (L) 07/04/2020 1524   PLT 394 10/02/2024 0412   PLT 640 (H) 07/04/2020 1524   MCV 82.1 10/02/2024 0412   MCV 73 (L) 07/04/2020 1524   MCH 24.6 (L) 10/02/2024 0412   MCHC 29.9 (L) 10/02/2024 0412   RDW 16.8 (H) 10/02/2024 0412   RDW 16.3 (H) 07/04/2020 1524   LYMPHSABS 1.2 08/15/2024 1518   MONOABS 1.3 (H) 08/15/2024 1518   EOSABS 0.0 08/15/2024 1518   BASOSABS 0.1 08/15/2024 1518   COAG:  Lab Results  Component Value Date   INR 1.0 08/15/2024   INR 1.2 09/13/2022   INR 1.0 05/05/2021    BMP:     Latest Ref Rng & Units 10/02/2024    4:12 AM 10/01/2024   10:15 AM 08/17/2024    4:53 AM  BMP  Glucose 70 - 99 mg/dL 45  854  94   BUN 8 - 23 mg/dL 31  43  18   Creatinine 0.44 - 1.00 mg/dL 9.22  9.06  9.30   Sodium 135 - 145 mmol/L 138  134  139   Potassium 3.5 - 5.1 mmol/L 3.0  3.6  3.4   Chloride 98 - 111 mmol/L 99  93  106   CO2 22 - 32 mmol/L 31  22  26    Calcium  8.9 - 10.3 mg/dL 8.5  9.0  8.0     HEPATIC:     Latest Ref Rng & Units 10/01/2024   10:15 AM 08/15/2024    3:18 PM 03/08/2024    8:30 PM  Hepatic Function  Total Protein 6.5 - 8.1 g/dL 6.7  6.2  5.5   Albumin  3.5 - 5.0 g/dL 3.8  3.7  2.7   AST 15 - 41 U/L 19  18  23    ALT 0 - 44 U/L 10  12  14    Alk Phosphatase 38 - 126 U/L 72  56  41   Total Bilirubin 0.0 - 1.2  mg/dL 0.8  0.9  0.9     CARDIAC:  Lab Results  Component Value Date   CKTOTAL 38 10/01/2024     Imaging: I personally reviewed and interpreted the available imaging.  Assessment & Plan:  This is a 83 y.o. female with history of DM, CAD, Cognitive impairment , large hiatal hernia , hypothyroidism presented with coffee ground emesis . GI is consulted for coffee-ground emesis concerning upper GI bleed  # Coffee-ground emesis # Severe iron  deficiency anemia  Last ferritin 4 iron  saturation 6%  Patient had upper endoscopy last month with large hiatal hernia with Ole lesions which can explain continued iron  deficiency with coffee-ground emesis.  This could be peptic ulcer disease duodenitis  Patient had enteroscopy few months ago and last colonoscopy 2022  IV PPI twice daily Keep n.p.o. Transfuse to keep hemoglobin more than 7 Correct electrolytes Plan for upper endoscopy today Spoke with patient has been fed over the phone Further recommendation to follow-up after upper endoscopy   Braedyn Riggle Faizan Tiffony Kite, MD Gastroenterology and Hepatology [facility]  Gastroenterology  This chart has been completed using Engineer, Civil (consulting) software, and while attempts have been made to ensure accuracy , certain words and phrases may not be transcribed as intended      [1]  Social History Tobacco Use   Smoking status: Never   Smokeless tobacco: Never  Vaping Use   Vaping status: Never Used  Substance Use Topics   Alcohol use: No   Drug use: No  [2]  Current Facility-Administered Medications:    0.9 %  sodium chloride  infusion (Manually program via Guardrails IV Fluids), , Intravenous, Once, Rashid, Farhan, MD   acetaminophen  (TYLENOL ) tablet 650 mg, 650 mg, Oral, Q6H PRN **OR** acetaminophen  (TYLENOL ) suppository 650 mg, 650 mg, Rectal, Q6H PRN, Rashid, Farhan, MD   albuterol  (PROVENTIL ) (2.5 MG/3ML) 0.083% nebulizer solution 2.5 mg, 2.5 mg, Nebulization, Q2H PRN,  Rashid, Farhan, MD   atorvastatin  (LIPITOR) tablet 20 mg, 20 mg, Oral, QPM, Rashid, Farhan, MD   hydrALAZINE  (APRESOLINE ) injection 10 mg, 10 mg, Intravenous, Q6H PRN, Rashid, Farhan, MD   insulin  aspart (novoLOG ) injection 0-5 Units, 0-5 Units, Subcutaneous, QHS, Rashid, Farhan, MD   insulin  aspart (novoLOG ) injection 0-9 Units, 0-9 Units, Subcutaneous, TID WC, Rashid, Farhan, MD   levothyroxine  (SYNTHROID ) tablet 100 mcg, 100 mcg, Oral, QAC breakfast, Rashid, Farhan, MD, 100 mcg at 10/02/24 0450   pantoprazole  (PROTONIX ) injection 40 mg, 40 mg, Intravenous, Q12H, Rashid, Farhan, MD, 40 mg at 10/01/24 2314   phenazopyridine  (PYRIDIUM ) tablet 100 mg, 100 mg, Oral, TID WC, Rashid, Farhan, MD, 100 mg at 10/01/24 1819   senna-docusate (Senokot-S) tablet 1 tablet, 1 tablet, Oral, QHS PRN, Dino Antu, MD   sucralfate  (CARAFATE ) 1 GM/10ML suspension 1 g, 1 g, Oral, TID WC & HS, Donnajean Lynwood DEL, PA-C, 1 g at 10/01/24 2314

## 2024-10-02 NOTE — Transfer of Care (Signed)
 Immediate Anesthesia Transfer of Care Note  Patient: Jeanne Kim  Procedure(s) Performed: EGD (ESOPHAGOGASTRODUODENOSCOPY)  Patient Location: PACU  Anesthesia Type:General  Level of Consciousness: drowsy, patient cooperative, and responds to stimulation  Airway & Oxygen Therapy: Patient Spontanous Breathing  Post-op Assessment: Report given to RN and Post -op Vital signs reviewed and stable  Post vital signs: Reviewed and stable  Last Vitals:  Vitals Value Taken Time  BP 99/50 10/02/24 11:42  Temp    Pulse 81 10/02/24 11:43  Resp 21 10/02/24 11:43  SpO2 99 % 10/02/24 11:43  Vitals shown include unfiled device data.  Last Pain:  Vitals:   10/02/24 1127  TempSrc:   PainSc: 0-No pain         Complications: No notable events documented.

## 2024-10-03 DIAGNOSIS — D62 Acute posthemorrhagic anemia: Secondary | ICD-10-CM | POA: Diagnosis not present

## 2024-10-03 LAB — BASIC METABOLIC PANEL WITH GFR
Anion gap: 13 (ref 5–15)
BUN: 13 mg/dL (ref 8–23)
CO2: 22 mmol/L (ref 22–32)
Calcium: 8 mg/dL — ABNORMAL LOW (ref 8.9–10.3)
Chloride: 100 mmol/L (ref 98–111)
Creatinine, Ser: 0.73 mg/dL (ref 0.44–1.00)
GFR, Estimated: >60 mL/min
Glucose, Bld: 215 mg/dL — ABNORMAL HIGH (ref 70–99)
Potassium: 2.8 mmol/L — ABNORMAL LOW (ref 3.5–5.1)
Sodium: 134 mmol/L — ABNORMAL LOW (ref 135–145)

## 2024-10-03 LAB — CBC WITH DIFFERENTIAL/PLATELET
Abs Immature Granulocytes: 0.07 K/uL (ref 0.00–0.07)
Basophils Absolute: 0.1 K/uL (ref 0.0–0.1)
Basophils Relative: 1 %
Eosinophils Absolute: 0.3 K/uL (ref 0.0–0.5)
Eosinophils Relative: 3 %
HCT: 25.7 % — ABNORMAL LOW (ref 36.0–46.0)
Hemoglobin: 7.9 g/dL — ABNORMAL LOW (ref 12.0–15.0)
Immature Granulocytes: 1 %
Lymphocytes Relative: 23 %
Lymphs Abs: 2 K/uL (ref 0.7–4.0)
MCH: 25.7 pg — ABNORMAL LOW (ref 26.0–34.0)
MCHC: 30.7 g/dL (ref 30.0–36.0)
MCV: 83.7 fL (ref 80.0–100.0)
Monocytes Absolute: 1.1 K/uL — ABNORMAL HIGH (ref 0.1–1.0)
Monocytes Relative: 13 %
Neutro Abs: 5 K/uL (ref 1.7–7.7)
Neutrophils Relative %: 59 %
Platelets: 317 K/uL (ref 150–400)
RBC: 3.07 MIL/uL — ABNORMAL LOW (ref 3.87–5.11)
RDW: 16.8 % — ABNORMAL HIGH (ref 11.5–15.5)
WBC: 8.6 K/uL (ref 4.0–10.5)
nRBC: 0 % (ref 0.0–0.2)

## 2024-10-03 LAB — BPAM RBC
Blood Product Expiration Date: 202601132359
ISSUE DATE / TIME: 202512261248
Unit Type and Rh: 9500

## 2024-10-03 LAB — GLUCOSE, CAPILLARY
Glucose-Capillary: 83 mg/dL (ref 70–99)
Glucose-Capillary: 135 mg/dL — ABNORMAL HIGH (ref 70–99)
Glucose-Capillary: 156 mg/dL — ABNORMAL HIGH (ref 70–99)
Glucose-Capillary: 202 mg/dL — ABNORMAL HIGH (ref 70–99)
Glucose-Capillary: 182 mg/dL — ABNORMAL HIGH (ref 70–99)

## 2024-10-03 LAB — TYPE AND SCREEN
ABO/RH(D): O NEG
Antibody Screen: NEGATIVE
Unit division: 0

## 2024-10-03 MED ORDER — POTASSIUM CHLORIDE 10 MEQ/100ML IV SOLN
10.0000 meq | INTRAVENOUS | Status: AC
  Administered 2024-10-03 (×4): 10 meq via INTRAVENOUS
  Filled 2024-10-03 (×4): qty 100

## 2024-10-03 MED ORDER — IRON SUCROSE 200 MG IVPB - SIMPLE MED
200.0000 mg | Freq: Once | Status: AC
  Administered 2024-10-03: 200 mg via INTRAVENOUS
  Filled 2024-10-03: qty 110
  Filled 2024-10-03: qty 10

## 2024-10-03 MED ORDER — POTASSIUM CHLORIDE CRYS ER 20 MEQ PO TBCR
40.0000 meq | EXTENDED_RELEASE_TABLET | Freq: Once | ORAL | Status: AC
  Administered 2024-10-03: 40 meq via ORAL
  Filled 2024-10-03: qty 2

## 2024-10-03 MED ORDER — PANTOPRAZOLE SODIUM 40 MG PO TBEC
40.0000 mg | DELAYED_RELEASE_TABLET | Freq: Every day | ORAL | Status: DC
  Administered 2024-10-04: 40 mg via ORAL
  Filled 2024-10-03 (×2): qty 1

## 2024-10-03 MED ORDER — FOSFOMYCIN TROMETHAMINE 3 G PO PACK
3.0000 g | PACK | Freq: Once | ORAL | Status: AC
  Administered 2024-10-03: 3 g via ORAL
  Filled 2024-10-03: qty 3

## 2024-10-03 NOTE — Plan of Care (Signed)

## 2024-10-03 NOTE — Progress Notes (Signed)
 " PROGRESS NOTE    Jeanne Kim  FMW:[id number] DOB: 06/30/1941 DOA: 10/01/2024 PCP: Orpha Yancey LABOR, MD   Brief Narrative:   83 y.o. female with medical history significant of multiple comorbidities including type 2 diabetes mellitus, coronary artery disease, gastric ulcer, anemia, cognitive impairment, hiatal hernia, hypothyroidism, who presented to the emergency room with chief complaint of coffee-ground emesis.  She has required 1 unit PRBC transfusion and underwent EGD on 12/26 with no significant bleeding noted.  There is a hiatal hernia and recommendation is to supplement with iron .  Assessment & Plan:   Principal Problem:   ABLA (acute blood loss anemia) Active Problems:   Chronic Cameron ulcer   Hematemesis  Assessment and Plan:  Acute blood loss anemia, POA: -Likely upper GI bleed, POA: - In the setting of gastric ulcers and prior GI bleeds - Status post 1 unit PRBC on 12/26 -Noted to have iron  deficiency and underwent IV iron  infusion 12/27 - Plan to keep on PPI daily and p.o. iron  every 48 hours outpatient -Continue to monitor hemoglobin in a.m. -Diet advanced on 12/27   Lactic acidosis,POA: Likely due to acute bleeding. No evidence of infection. F/u CXR-unremarkable -Lactic acid is down from 3.7-2.3     Possible acute uncomplicated UTI, POA: Patient is symptomatic with dysuria, urinary frequency and there is presence of bacteria in the urine.   - No further need for Rocephin  patient given fosfomycin  -Noted to have mild temperature elevation on 12/27, continue to monitor and consider rechecking for flu if this persists   Type 2 diabetes mellitus with hypoglycemia: Hypoglycemia protocol in place.  Avoid any scheduled diabetic medications.   Hypothyroidism, POA: Continue with Synthroid    Hyperlipidemia, POA: Continue with statin    DVT prophylaxis: SCDs Code Status: Full Family Communication: Daughter and son-in-law at bedside 12/27 Disposition Plan:   Status is: Inpatient Remains inpatient appropriate because: Need for IV medications.   Consultants:  None  Procedures:  None  Antimicrobials:  Anti-infectives (From admission, onward)    Start     Dose/Rate Route Frequency Ordered Stop   10/03/24 1000  fosfomycin  (MONUROL ) packet 3 g        3 g Oral  Once 10/03/24 0751 10/03/24 0801   10/02/24 1800  cefTRIAXone  (ROCEPHIN ) 1 g in sodium chloride  0.9 % 100 mL IVPB  Status:  Discontinued        1 g 200 mL/hr over 30 Minutes Intravenous Every 24 hours 10/02/24 1602 10/03/24 0751       Subjective: Patient seen and evaluated today with no new acute complaints or concerns. No acute concerns or events noted overnight.  Noted to have low-grade temperature 99.2 today and according to family she appears to be mostly at her baseline.  They are requesting an additional day of stay to ensure that she is safe for discharge in a.m.  Objective: Vitals:   10/02/24 1551 10/02/24 1941 10/03/24 0402 10/03/24 1209  BP: 112/86 (!) 110/57 (!) 114/57 130/66  Pulse: 91 82 84 87  Resp:  18 18   Temp: 98.6 F (37 C) 97.9 F (36.6 C) 98.7 F (37.1 C) 99.2 F (37.3 C)  TempSrc: Oral Oral Oral Oral  SpO2: 96% 98% 98% 95%  Weight:      Height:        Intake/Output Summary (Last 24 hours) at 10/03/2024 1416 Last data filed at 10/03/2024 9176 Gross per 24 hour  Intake 1334.17 ml  Output 900 ml  Net 434.17 ml  Filed Weights   10/01/24 1002  Weight: 68 kg    Examination:  General exam: Appears calm and comfortable  Respiratory system: Clear to auscultation. Respiratory effort normal. Cardiovascular system: S1 & S2 heard, RRR.  Gastrointestinal system: Abdomen is soft Central nervous system: Alert and awake Extremities: No edema Skin: No significant lesions noted Psychiatry: Flat affect.    Data Reviewed: I have personally reviewed following labs and imaging studies  CBC: Recent Labs  Lab 10/01/24 1015 10/02/24 0412  10/03/24 0404  WBC 13.2* 13.6* 8.6  NEUTROABS  --   --  5.0  HGB 8.5* 7.0* 7.9*  HCT 28.7* 23.4* 25.7*  MCV 83.2 82.1 83.7  PLT 361 394 317   Basic Metabolic Panel: Recent Labs  Lab 10/01/24 1015 10/02/24 0412  NA 134* 138  K 3.6 3.0*  CL 93* 99  CO2 22 31  GLUCOSE 145* 45*  BUN 43* 31*  CREATININE 0.93 0.77  CALCIUM  9.0 8.5*   GFR: Estimated Creatinine Clearance: 51.6 mL/min (by C-G formula based on SCr of 0.77 mg/dL). Liver Function Tests: Recent Labs  Lab 10/01/24 1015  AST 19  ALT 10  ALKPHOS 72  BILITOT 0.8  PROT 6.7  ALBUMIN  3.8   No results for input(s): LIPASE, AMYLASE in the last 168 hours. No results for input(s): AMMONIA in the last 168 hours. Coagulation Profile: No results for input(s): INR, PROTIME in the last 168 hours. Cardiac Enzymes: Recent Labs  Lab 10/01/24 1029  CKTOTAL 38   BNP (last 3 results) No results for input(s): PROBNP in the last 8760 hours. HbA1C: No results for input(s): HGBA1C in the last 72 hours. CBG: Recent Labs  Lab 10/02/24 1948 10/02/24 2351 10/03/24 0355 10/03/24 0714 10/03/24 1120  GLUCAP 91 86 83 135* 156*   Lipid Profile: No results for input(s): CHOL, HDL, LDLCALC, TRIG, CHOLHDL, LDLDIRECT in the last 72 hours. Thyroid  Function Tests: No results for input(s): TSH, T4TOTAL, FREET4, T3FREE, THYROIDAB in the last 72 hours. Anemia Panel: No results for input(s): VITAMINB12, FOLATE, FERRITIN, TIBC, IRON , RETICCTPCT in the last 72 hours. Sepsis Labs: Recent Labs  Lab 10/01/24 1115 10/01/24 1439  LATICACIDVEN 3.7* 2.3*    Recent Results (from the past 240 hours)  Resp panel by RT-PCR (RSV, Flu A&B, Covid) Anterior Nasal Swab     Status: None   Collection Time: 10/01/24 10:37 AM   Specimen: Anterior Nasal Swab  Result Value Ref Range Status   SARS Coronavirus 2 by RT PCR NEGATIVE NEGATIVE Final    Comment: (NOTE) SARS-CoV-2 target nucleic acids are NOT  DETECTED.  The SARS-CoV-2 RNA is generally detectable in upper respiratory specimens during the acute phase of infection. The lowest concentration of SARS-CoV-2 viral copies this assay can detect is 138 copies/mL. A negative result does not preclude SARS-Cov-2 infection and should not be used as the sole basis for treatment or other patient management decisions. A negative result may occur with  improper specimen collection/handling, submission of specimen other than nasopharyngeal swab, presence of viral mutation(s) within the areas targeted by this assay, and inadequate number of viral copies(<138 copies/mL). A negative result must be combined with clinical observations, patient history, and epidemiological information. The expected result is Negative.  Fact Sheet for Patients:  bloggercourse.com  Fact Sheet for Healthcare Providers:  seriousbroker.it  This test is no t yet approved or cleared by the [place]  FDA and  has been authorized for detection and/or diagnosis of SARS-CoV-2 by FDA under an Emergency Use  Authorization (EUA). This EUA will remain  in effect (meaning this test can be used) for the duration of the COVID-19 declaration under Section 564(b)(1) of the Act, 21 U.S.C.section 360bbb-3(b)(1), unless the authorization is terminated  or revoked sooner.       Influenza A by PCR NEGATIVE NEGATIVE Final   Influenza B by PCR NEGATIVE NEGATIVE Final    Comment: (NOTE) The Xpert Xpress SARS-CoV-2/FLU/RSV plus assay is intended as an aid in the diagnosis of influenza from Nasopharyngeal swab specimens and should not be used as a sole basis for treatment. Nasal washings and aspirates are unacceptable for Xpert Xpress SARS-CoV-2/FLU/RSV testing.  Fact Sheet for Patients: bloggercourse.com  Fact Sheet for Healthcare Providers: seriousbroker.it  This test is not yet  approved or cleared by the [place]  FDA and has been authorized for detection and/or diagnosis of SARS-CoV-2 by FDA under an Emergency Use Authorization (EUA). This EUA will remain in effect (meaning this test can be used) for the duration of the COVID-19 declaration under Section 564(b)(1) of the Act, 21 U.S.C. section 360bbb-3(b)(1), unless the authorization is terminated or revoked.     Resp Syncytial Virus by PCR NEGATIVE NEGATIVE Final    Comment: (NOTE) Fact Sheet for Patients: bloggercourse.com  Fact Sheet for Healthcare Providers: seriousbroker.it  This test is not yet approved or cleared by the [place]  FDA and has been authorized for detection and/or diagnosis of SARS-CoV-2 by FDA under an Emergency Use Authorization (EUA). This EUA will remain in effect (meaning this test can be used) for the duration of the COVID-19 declaration under Section 564(b)(1) of the Act, 21 U.S.C. section 360bbb-3(b)(1), unless the authorization is terminated or revoked.  Performed at [facility], [address], [place], [place] [address]   Blood culture (routine x 2)     Status: None (Preliminary result)   Collection Time: 10/01/24 11:15 AM   Specimen: BLOOD LEFT HAND  Result Value Ref Range Status   Specimen Description   Final    BLOOD LEFT HAND BOTTLES DRAWN AEROBIC AND ANAEROBIC   Special Requests Blood Culture adequate volume  Final   Culture   Final    NO GROWTH < 24 HOURS Performed at [facility], [address], [place], [place] [address]    Report Status PENDING  Incomplete  Blood culture (routine x 2)     Status: None (Preliminary result)   Collection Time: 10/01/24 11:25 AM   Specimen: BLOOD RIGHT HAND  Result Value Ref Range Status   Specimen Description   Final    BLOOD RIGHT HAND BOTTLES DRAWN AEROBIC AND ANAEROBIC   Special Requests Blood Culture adequate volume  Final   Culture   Final    NO GROWTH < 24  HOURS Performed at [facility], [address] [place], [place] [address]    Report Status PENDING  Incomplete         Radiology Studies: No results found.      Scheduled Meds:  atorvastatin   20 mg Oral QPM   insulin  aspart  0-5 Units Subcutaneous QHS   insulin  aspart  0-6 Units Subcutaneous Q4H   levothyroxine   100 mcg Oral QAC breakfast   pantoprazole  (PROTONIX ) IV  40 mg Intravenous Q12H   phenazopyridine   100 mg Oral TID WC   sucralfate   1 g Oral TID WC & HS     LOS: 2 days    Time spent: 55 minutes    Jeanne Branscomb JONETTA Fairly, DO [facility]  If 7PM-7AM,  please contact night-coverage [email or web address] 10/03/2024, 2:16 PM   "

## 2024-10-04 DIAGNOSIS — D62 Acute posthemorrhagic anemia: Secondary | ICD-10-CM | POA: Diagnosis not present

## 2024-10-04 LAB — CBC
HCT: 26.8 % — ABNORMAL LOW (ref 36.0–46.0)
Hemoglobin: 8 g/dL — ABNORMAL LOW (ref 12.0–15.0)
MCH: 25.4 pg — ABNORMAL LOW (ref 26.0–34.0)
MCHC: 29.9 g/dL — ABNORMAL LOW (ref 30.0–36.0)
MCV: 85.1 fL (ref 80.0–100.0)
Platelets: 350 K/uL (ref 150–400)
RBC: 3.15 MIL/uL — ABNORMAL LOW (ref 3.87–5.11)
RDW: 17.1 % — ABNORMAL HIGH (ref 11.5–15.5)
WBC: 7.4 K/uL (ref 4.0–10.5)
nRBC: 0 % (ref 0.0–0.2)

## 2024-10-04 LAB — BASIC METABOLIC PANEL WITH GFR
Anion gap: 8 (ref 5–15)
BUN: 13 mg/dL (ref 8–23)
CO2: 28 mmol/L (ref 22–32)
Calcium: 8.5 mg/dL — ABNORMAL LOW (ref 8.9–10.3)
Chloride: 101 mmol/L (ref 98–111)
Creatinine, Ser: 0.84 mg/dL (ref 0.44–1.00)
GFR, Estimated: >60 mL/min
Glucose, Bld: 194 mg/dL — ABNORMAL HIGH (ref 70–99)
Potassium: 4 mmol/L (ref 3.5–5.1)
Sodium: 136 mmol/L (ref 135–145)

## 2024-10-04 LAB — GLUCOSE, CAPILLARY
Glucose-Capillary: 140 mg/dL — ABNORMAL HIGH (ref 70–99)
Glucose-Capillary: 179 mg/dL — ABNORMAL HIGH (ref 70–99)

## 2024-10-04 LAB — MAGNESIUM: Magnesium: 1.5 mg/dL — ABNORMAL LOW (ref 1.7–2.4)

## 2024-10-04 MED ORDER — SENNOSIDES-DOCUSATE SODIUM 8.6-50 MG PO TABS: 1.0000 | ORAL_TABLET | Freq: Every evening | ORAL | 1 refills | Status: AC | PRN

## 2024-10-04 MED ORDER — MAGNESIUM SULFATE 2 GM/50ML IV SOLN
2.0000 g | Freq: Once | INTRAVENOUS | Status: AC
  Administered 2024-10-04: 2 g via INTRAVENOUS
  Filled 2024-10-04: qty 50

## 2024-10-04 MED ORDER — PANTOPRAZOLE SODIUM 40 MG PO TBEC: 40.0000 mg | DELAYED_RELEASE_TABLET | Freq: Every day | ORAL | 2 refills | Status: AC

## 2024-10-04 MED ORDER — FERROUS SULFATE 325 (65 FE) MG PO TABS: 325.0000 mg | ORAL_TABLET | ORAL | 3 refills | Status: AC

## 2024-10-04 NOTE — Discharge Summary (Signed)
 Physician Discharge Summary  ALAYZIA PAVLOCK FMW:[id number] DOB: Feb 25, 1941 DOA: 10/01/2024  PCP: Orpha Yancey LABOR, MD  Admit date: 10/01/2024  Discharge date: 10/04/2024  Admitted From:Home  Disposition:  Home  Recommendations for Outpatient Follow-up:  Follow up with PCP in 1-2 weeks Follow-up with GI with referral sent Remain on iron  supplementation every other day as recommended Remain on PPI daily Continue other home medications as prior  Home Health: None  Equipment/Devices: None  Discharge Condition:Stable  CODE STATUS: Full  Diet recommendation: Heart Healthy/carb modified  Brief/Interim Summary:  83 y.o. female with medical history significant of multiple comorbidities including type 2 diabetes mellitus, coronary artery disease, gastric ulcer, anemia, cognitive impairment, hiatal hernia, hypothyroidism, who presented to the emergency room with chief complaint of coffee-ground emesis.  She has required 1 unit PRBC transfusion and underwent EGD on 12/26 with no significant bleeding noted.  There is a hiatal hernia and recommendation is to supplement with iron .  Patient has stable hemoglobin levels and did receive IV iron  infusion prior to discharge.  Overall she is doing quite well and will follow-up with GI with referral sent.  She was noted to have an uncomplicated UTI and has completed treatment with fosfomycin  yesterday.  She has no further temperature elevation.  No other acute events or concerns noted.  Discharge Diagnoses:  Principal Problem:   ABLA (acute blood loss anemia) Active Problems:   Chronic Cameron ulcer   Hematemesis  Principal discharge diagnosis: Acute blood loss anemia in the setting of coffee-ground emesis with EGD showing few Cameron ulcers.  Discharge Instructions  Discharge Instructions     Ambulatory referral to Gastroenterology   Complete by: As directed    What is the reason for referral?: Other   Increase activity slowly   Complete  by: As directed       Allergies as of 10/04/2024       Reactions   Codeine Hives   Sulfonamide Derivatives Hives   Canagliflozin    Penicillins Hives   Pt states she does NOT have allergy to penicillin   Sulfa Antibiotics Other (See Comments)   unknown        Medication List     STOP taking these medications    cephALEXin 500 MG capsule Commonly known as: KEFLEX       TAKE these medications    amLODipine  2.5 MG tablet Commonly known as: NORVASC  Take 2.5 mg by mouth daily.   Aspirin  Low Dose 81 MG tablet Generic drug: aspirin  EC Take 81 mg by mouth every morning.   atorvastatin  20 MG tablet Commonly known as: LIPITOR Take 20 mg by mouth every evening.   doxepin  10 MG capsule Commonly known as: SINEQUAN  Take 10 mg by mouth at bedtime.   DULoxetine  60 MG capsule Commonly known as: CYMBALTA  Take 60 mg by mouth daily.   EPINEPHrine  0.3 mg/0.3 mL Soaj injection Commonly known as: EPI-PEN Inject 0.3 mg into the muscle daily as needed for anaphylaxis.   ferrous sulfate  325 (65 FE) MG tablet Take 1 tablet (325 mg total) by mouth every other day. What changed: when to take this   furosemide  20 MG tablet Commonly known as: LASIX  Take 1 tablet (20 mg total) by mouth daily.   Jardiance  25 MG Tabs tablet Generic drug: empagliflozin  Take 1 tablet (25 mg total) by mouth daily.   levothyroxine  125 MCG tablet Commonly known as: SYNTHROID  Take 1 tablet (125 mcg total) by mouth daily at 6 (six) AM.  metFORMIN 1000 MG tablet Commonly known as: GLUCOPHAGE Take 1,000 mg by mouth 2 (two) times daily.   metoprolol  succinate 25 MG 24 hr tablet Commonly known as: TOPROL -XL Take 25 mg by mouth daily.   midodrine  2.5 MG tablet Commonly known as: PROAMATINE  Take 2.5 mg by mouth 3 (three) times daily.   pantoprazole  40 MG tablet Commonly known as: PROTONIX  Take 1 tablet (40 mg total) by mouth daily. Start taking on: October 05, 2024 What changed: when to  take this   pioglitazone 45 MG tablet Commonly known as: ACTOS Take 45 mg by mouth daily.   polyethylene glycol powder 17 GM/SCOOP powder Commonly known as: GLYCOLAX /MIRALAX  Take 17 g by mouth daily.   potassium chloride  SA 20 MEQ tablet Commonly known as: KLOR-CON  M Take 1 tablet (20 mEq total) by mouth daily.   ramipril  5 MG capsule Commonly known as: ALTACE  Take 5 mg by mouth daily.   senna-docusate 8.6-50 MG tablet Commonly known as: Senokot-S Take 1 tablet by mouth at bedtime as needed for mild constipation.   Soliqua  100-33 UNT-MCG/ML Sopn Generic drug: Insulin  Glargine-Lixisenatide  Inject 20 Units into the skin daily. Sliding Scale   sucralfate  1 GM/10ML suspension Commonly known as: CARAFATE  Take 10 mLs (1 g total) by mouth 4 (four) times daily -  with meals and at bedtime for 14 days.        Follow-up Information     Orpha Yancey LABOR, MD. Schedule an appointment as soon as possible for a visit in 1 week(s).   Specialty: Internal Medicine Contact information: [address] [place] [address] [phone]         [facility]. Go to.   Contact information: [address] [place]  [address] [phone]               Allergies[1]  Consultations: GI   Procedures/Studies: DG CHEST PORT 1 VIEW Result Date: 10/01/2024 CLINICAL DATA:  Coffee ground emesis and diarrhea. EXAM: PORTABLE CHEST 1 VIEW COMPARISON:  March 08, 2024 FINDINGS: The cardiac silhouette is mildly enlarged and unchanged in size. Low lung volumes are noted. A small ill-defined patchy opacity is seen within the suprahilar region on the left. No pleural effusion or pneumothorax is identified. Postoperative changes are noted throughout the mid to lower cervical spine. Multilevel degenerative changes are seen throughout the thoracic spine. IMPRESSION: Low lung volumes with a small ill-defined patchy opacity within the suprahilar region on  the left which may represent a small area of atelectasis and/or infiltrate. Follow-up to resolution is recommended to exclude the presence of an underlying neoplastic process. Electronically Signed   By: Suzen Dials M.D.   On: 10/01/2024 13:56     Discharge Exam: Vitals:   10/03/24 1919 10/04/24 0403  BP: 121/64 132/61  Pulse: 88 91  Resp: 18 18  Temp: 98.1 F (36.7 C) 98.5 F (36.9 C)  SpO2: 100% 98%   Vitals:   10/03/24 1209 10/03/24 1436 10/03/24 1919 10/04/24 0403  BP: 130/66  121/64 132/61  Pulse: 87  88 91  Resp:   18 18  Temp: 99.2 F (37.3 C) 98.8 F (37.1 C) 98.1 F (36.7 C) 98.5 F (36.9 C)  TempSrc: Oral Oral  Oral  SpO2: 95%  100% 98%  Weight:      Height:        General: Pt is alert, awake, not in acute distress Cardiovascular: RRR, S1/S2 +, no rubs, no gallops Respiratory: CTA bilaterally, no wheezing,  no rhonchi Abdominal: Soft, NT, ND, bowel sounds + Extremities: no edema, no cyanosis    The results of significant diagnostics from this hospitalization (including imaging, microbiology, ancillary and laboratory) are listed below for reference.     Microbiology: Recent Results (from the past 240 hours)  Resp panel by RT-PCR (RSV, Flu A&B, Covid) Anterior Nasal Swab     Status: None   Collection Time: 10/01/24 10:37 AM   Specimen: Anterior Nasal Swab  Result Value Ref Range Status   SARS Coronavirus 2 by RT PCR NEGATIVE NEGATIVE Final    Comment: (NOTE) SARS-CoV-2 target nucleic acids are NOT DETECTED.  The SARS-CoV-2 RNA is generally detectable in upper respiratory specimens during the acute phase of infection. The lowest concentration of SARS-CoV-2 viral copies this assay can detect is 138 copies/mL. A negative result does not preclude SARS-Cov-2 infection and should not be used as the sole basis for treatment or other patient management decisions. A negative result may occur with  improper specimen collection/handling, submission of  specimen other than nasopharyngeal swab, presence of viral mutation(s) within the areas targeted by this assay, and inadequate number of viral copies(<138 copies/mL). A negative result must be combined with clinical observations, patient history, and epidemiological information. The expected result is Negative.  Fact Sheet for Patients:  bloggercourse.com  Fact Sheet for Healthcare Providers:  seriousbroker.it  This test is no t yet approved or cleared by the [place]  FDA and  has been authorized for detection and/or diagnosis of SARS-CoV-2 by FDA under an Emergency Use Authorization (EUA). This EUA will remain  in effect (meaning this test can be used) for the duration of the COVID-19 declaration under Section 564(b)(1) of the Act, 21 U.S.C.section 360bbb-3(b)(1), unless the authorization is terminated  or revoked sooner.       Influenza A by PCR NEGATIVE NEGATIVE Final   Influenza B by PCR NEGATIVE NEGATIVE Final    Comment: (NOTE) The Xpert Xpress SARS-CoV-2/FLU/RSV plus assay is intended as an aid in the diagnosis of influenza from Nasopharyngeal swab specimens and should not be used as a sole basis for treatment. Nasal washings and aspirates are unacceptable for Xpert Xpress SARS-CoV-2/FLU/RSV testing.  Fact Sheet for Patients: bloggercourse.com  Fact Sheet for Healthcare Providers: seriousbroker.it  This test is not yet approved or cleared by the [place]  FDA and has been authorized for detection and/or diagnosis of SARS-CoV-2 by FDA under an Emergency Use Authorization (EUA). This EUA will remain in effect (meaning this test can be used) for the duration of the COVID-19 declaration under Section 564(b)(1) of the Act, 21 U.S.C. section 360bbb-3(b)(1), unless the authorization is terminated or revoked.     Resp Syncytial Virus by PCR NEGATIVE NEGATIVE Final     Comment: (NOTE) Fact Sheet for Patients: bloggercourse.com  Fact Sheet for Healthcare Providers: seriousbroker.it  This test is not yet approved or cleared by the [place]  FDA and has been authorized for detection and/or diagnosis of SARS-CoV-2 by FDA under an Emergency Use Authorization (EUA). This EUA will remain in effect (meaning this test can be used) for the duration of the COVID-19 declaration under Section 564(b)(1) of the Act, 21 U.S.C. section 360bbb-3(b)(1), unless the authorization is terminated or revoked.  Performed at [facility], [address], [place], [place] [address]   Blood culture (routine x 2)     Status: None (Preliminary result)   Collection Time: 10/01/24 11:15 AM   Specimen: BLOOD LEFT HAND  Result Value Ref Range Status  Specimen Description   Final    BLOOD LEFT HAND BOTTLES DRAWN AEROBIC AND ANAEROBIC   Special Requests Blood Culture adequate volume  Final   Culture   Final    NO GROWTH 3 DAYS Performed at [facility], [address], [place], [place] [address]    Report Status PENDING  Incomplete  Blood culture (routine x 2)     Status: None (Preliminary result)   Collection Time: 10/01/24 11:25 AM   Specimen: BLOOD RIGHT HAND  Result Value Ref Range Status   Specimen Description   Final    BLOOD RIGHT HAND BOTTLES DRAWN AEROBIC AND ANAEROBIC   Special Requests Blood Culture adequate volume  Final   Culture   Final    NO GROWTH 3 DAYS Performed at [facility], [address] [place], [place] [address]    Report Status PENDING  Incomplete     Labs: BNP (last 3 results) No results for input(s): BNP in the last 8760 hours. Basic Metabolic Panel: Recent Labs  Lab 10/01/24 1015 10/02/24 0412 10/03/24 1510 10/04/24 0357  NA 134* 138 134* 136  K 3.6 3.0* 2.8* 4.0  CL 93* 99 100 101  CO2 22 31 22 28   GLUCOSE 145* 45* 215* 194*  BUN 43* 31* 13 13  CREATININE 0.93 0.77 0.73  0.84  CALCIUM  9.0 8.5* 8.0* 8.5*  MG  --   --   --  1.5*   Liver Function Tests: Recent Labs  Lab 10/01/24 1015  AST 19  ALT 10  ALKPHOS 72  BILITOT 0.8  PROT 6.7  ALBUMIN  3.8   No results for input(s): LIPASE, AMYLASE in the last 168 hours. No results for input(s): AMMONIA in the last 168 hours. CBC: Recent Labs  Lab 10/01/24 1015 10/02/24 0412 10/03/24 0404 10/04/24 0357  WBC 13.2* 13.6* 8.6 7.4  NEUTROABS  --   --  5.0  --   HGB 8.5* 7.0* 7.9* 8.0*  HCT 28.7* 23.4* 25.7* 26.8*  MCV 83.2 82.1 83.7 85.1  PLT 361 394 317 350   Cardiac Enzymes: Recent Labs  Lab 10/01/24 1029  CKTOTAL 38   BNP: Invalid input(s): POCBNP CBG: Recent Labs  Lab 10/03/24 0714 10/03/24 1120 10/03/24 1613 10/03/24 1923 10/04/24 0724  GLUCAP 135* 156* 202* 182* 140*   D-Dimer No results for input(s): DDIMER in the last 72 hours. Hgb A1c No results for input(s): HGBA1C in the last 72 hours. Lipid Profile No results for input(s): CHOL, HDL, LDLCALC, TRIG, CHOLHDL, LDLDIRECT in the last 72 hours. Thyroid  function studies No results for input(s): TSH, T4TOTAL, T3FREE, THYROIDAB in the last 72 hours.  Invalid input(s): FREET3 Anemia work up No results for input(s): VITAMINB12, FOLATE, FERRITIN, TIBC, IRON , RETICCTPCT in the last 72 hours. Urinalysis    Component Value Date/Time   COLORURINE YELLOW 10/01/2024 1723   APPEARANCEUR HAZY (A) 10/01/2024 1723   LABSPEC 1.011 10/01/2024 1723   PHURINE 5.0 10/01/2024 1723   GLUCOSEU >=500 (A) 10/01/2024 1723   HGBUR SMALL (A) 10/01/2024 1723   BILIRUBINUR NEGATIVE 10/01/2024 1723   KETONESUR NEGATIVE 10/01/2024 1723   PROTEINUR NEGATIVE 10/01/2024 1723   NITRITE NEGATIVE 10/01/2024 1723   LEUKOCYTESUR LARGE (A) 10/01/2024 1723   Sepsis Labs Recent Labs  Lab 10/01/24 1015 10/02/24 0412 10/03/24 0404 10/04/24 0357  WBC 13.2* 13.6* 8.6 7.4   Microbiology Recent Results (from the  past 240 hours)  Resp panel by RT-PCR (RSV, Flu A&B, Covid) Anterior Nasal Swab  Status: None   Collection Time: 10/01/24 10:37 AM   Specimen: Anterior Nasal Swab  Result Value Ref Range Status   SARS Coronavirus 2 by RT PCR NEGATIVE NEGATIVE Final    Comment: (NOTE) SARS-CoV-2 target nucleic acids are NOT DETECTED.  The SARS-CoV-2 RNA is generally detectable in upper respiratory specimens during the acute phase of infection. The lowest concentration of SARS-CoV-2 viral copies this assay can detect is 138 copies/mL. A negative result does not preclude SARS-Cov-2 infection and should not be used as the sole basis for treatment or other patient management decisions. A negative result may occur with  improper specimen collection/handling, submission of specimen other than nasopharyngeal swab, presence of viral mutation(s) within the areas targeted by this assay, and inadequate number of viral copies(<138 copies/mL). A negative result must be combined with clinical observations, patient history, and epidemiological information. The expected result is Negative.  Fact Sheet for Patients:  bloggercourse.com  Fact Sheet for Healthcare Providers:  seriousbroker.it  This test is no t yet approved or cleared by the [place]  FDA and  has been authorized for detection and/or diagnosis of SARS-CoV-2 by FDA under an Emergency Use Authorization (EUA). This EUA will remain  in effect (meaning this test can be used) for the duration of the COVID-19 declaration under Section 564(b)(1) of the Act, 21 U.S.C.section 360bbb-3(b)(1), unless the authorization is terminated  or revoked sooner.       Influenza A by PCR NEGATIVE NEGATIVE Final   Influenza B by PCR NEGATIVE NEGATIVE Final    Comment: (NOTE) The Xpert Xpress SARS-CoV-2/FLU/RSV plus assay is intended as an aid in the diagnosis of influenza from Nasopharyngeal swab specimens  and should not be used as a sole basis for treatment. Nasal washings and aspirates are unacceptable for Xpert Xpress SARS-CoV-2/FLU/RSV testing.  Fact Sheet for Patients: bloggercourse.com  Fact Sheet for Healthcare Providers: seriousbroker.it  This test is not yet approved or cleared by the [place]  FDA and has been authorized for detection and/or diagnosis of SARS-CoV-2 by FDA under an Emergency Use Authorization (EUA). This EUA will remain in effect (meaning this test can be used) for the duration of the COVID-19 declaration under Section 564(b)(1) of the Act, 21 U.S.C. section 360bbb-3(b)(1), unless the authorization is terminated or revoked.     Resp Syncytial Virus by PCR NEGATIVE NEGATIVE Final    Comment: (NOTE) Fact Sheet for Patients: bloggercourse.com  Fact Sheet for Healthcare Providers: seriousbroker.it  This test is not yet approved or cleared by the [place]  FDA and has been authorized for detection and/or diagnosis of SARS-CoV-2 by FDA under an Emergency Use Authorization (EUA). This EUA will remain in effect (meaning this test can be used) for the duration of the COVID-19 declaration under Section 564(b)(1) of the Act, 21 U.S.C. section 360bbb-3(b)(1), unless the authorization is terminated or revoked.  Performed at [facility], [address] [place], [place] [address]   Blood culture (routine x 2)     Status: None (Preliminary result)   Collection Time: 10/01/24 11:15 AM   Specimen: BLOOD LEFT HAND  Result Value Ref Range Status   Specimen Description   Final    BLOOD LEFT HAND BOTTLES DRAWN AEROBIC AND ANAEROBIC   Special Requests Blood Culture adequate volume  Final   Culture   Final    NO GROWTH 3 DAYS Performed at [facility], [address] [place], [place] [address]    Report Status PENDING  Incomplete  Blood culture (routine x 2)  Status: None (Preliminary result)   Collection Time: 10/01/24 11:25 AM   Specimen: BLOOD RIGHT HAND  Result Value Ref Range Status   Specimen Description   Final    BLOOD RIGHT HAND BOTTLES DRAWN AEROBIC AND ANAEROBIC   Special Requests Blood Culture adequate volume  Final   Culture   Final    NO GROWTH 3 DAYS Performed at [facility], [address], [place], [place] [address]    Report Status PENDING  Incomplete     Time coordinating discharge: 35 minutes  SIGNED:   Adron JONETTA Fairly, DO [facility] 10/04/2024, 10:15 AM  If 7PM-7AM, please contact night-coverage [email or web address]     [1]  Allergies Allergen Reactions   Codeine Hives   Sulfonamide Derivatives Hives   Canagliflozin    Penicillins Hives    Pt states she does NOT have allergy to penicillin   Sulfa Antibiotics Other (See Comments)    unknown

## 2024-10-05 ENCOUNTER — Telehealth (INDEPENDENT_AMBULATORY_CARE_PROVIDER_SITE_OTHER): Payer: Self-pay | Admitting: *Deleted

## 2024-10-05 NOTE — Telephone Encounter (Signed)
-----   Message from Devere RAMAN sent at 10/05/2024  3:19 PM EST ----- Regarding: FW: referral Forwarded to Jenkins for referral ----- Message ----- From: Cinderella Deatrice FALCON, MD Sent: 10/02/2024  11:53 AM EST To: Devere JONETTA Marcus; Therisa LELON Stager, NP Subject: referral                                       Erskin Jenkins ,  Can you please arrange a referral for this patient to hematology   Diagnosis: Severe iron  deficiency anemia  Hi Devere,  Can you please schedule a follow up appointment for this patient in 2-3 weeks with Dr Cindie, me  or any of the APPs?  Thanks,  Muhammad Faizan Ahmed , MD Gastroenterology and Hepatology [facility]

## 2024-10-05 NOTE — Telephone Encounter (Signed)
 Referral sent, they will contact patient with apt

## 2024-10-06 ENCOUNTER — Encounter (HOSPITAL_COMMUNITY): Payer: Self-pay | Admitting: Gastroenterology

## 2024-10-06 LAB — CULTURE, BLOOD (ROUTINE X 2)
Culture: NO GROWTH
Culture: NO GROWTH
Special Requests: ADEQUATE
Special Requests: ADEQUATE

## 2024-11-04 ENCOUNTER — Ambulatory Visit (INDEPENDENT_AMBULATORY_CARE_PROVIDER_SITE_OTHER): Admitting: Gastroenterology

## 2024-11-10 ENCOUNTER — Emergency Department (HOSPITAL_COMMUNITY)
Admission: EM | Admit: 2024-11-10 | Discharge: 2024-11-11 | Disposition: A | Attending: Emergency Medicine | Admitting: Emergency Medicine

## 2024-11-10 ENCOUNTER — Other Ambulatory Visit: Payer: Self-pay

## 2024-11-10 ENCOUNTER — Encounter (HOSPITAL_COMMUNITY): Payer: Self-pay

## 2024-11-10 DIAGNOSIS — Z7984 Long term (current) use of oral hypoglycemic drugs: Secondary | ICD-10-CM | POA: Insufficient documentation

## 2024-11-10 DIAGNOSIS — E119 Type 2 diabetes mellitus without complications: Secondary | ICD-10-CM | POA: Insufficient documentation

## 2024-11-10 DIAGNOSIS — I10 Essential (primary) hypertension: Secondary | ICD-10-CM | POA: Insufficient documentation

## 2024-11-10 DIAGNOSIS — Z79890 Hormone replacement therapy (postmenopausal): Secondary | ICD-10-CM | POA: Insufficient documentation

## 2024-11-10 DIAGNOSIS — Z79899 Other long term (current) drug therapy: Secondary | ICD-10-CM | POA: Insufficient documentation

## 2024-11-10 DIAGNOSIS — Z794 Long term (current) use of insulin: Secondary | ICD-10-CM | POA: Insufficient documentation

## 2024-11-10 DIAGNOSIS — E039 Hypothyroidism, unspecified: Secondary | ICD-10-CM | POA: Insufficient documentation

## 2024-11-10 DIAGNOSIS — I2510 Atherosclerotic heart disease of native coronary artery without angina pectoris: Secondary | ICD-10-CM | POA: Insufficient documentation

## 2024-11-10 DIAGNOSIS — Z7982 Long term (current) use of aspirin: Secondary | ICD-10-CM | POA: Insufficient documentation

## 2024-11-10 DIAGNOSIS — Z853 Personal history of malignant neoplasm of breast: Secondary | ICD-10-CM | POA: Insufficient documentation

## 2024-11-10 DIAGNOSIS — N3000 Acute cystitis without hematuria: Secondary | ICD-10-CM | POA: Insufficient documentation

## 2024-11-10 LAB — URINALYSIS, ROUTINE W REFLEX MICROSCOPIC
Bilirubin Urine: NEGATIVE
Glucose, UA: >=500 mg/dL — AB
Ketones, ur: NEGATIVE mg/dL
Nitrite: NEGATIVE
Protein, ur: 100 mg/dL — AB
RBC / HPF: >50 RBC/hpf (ref 0–5)
Specific Gravity, Urine: 1.012 (ref 1.005–1.030)
WBC, UA: >50 WBC/hpf (ref 0–5)
pH: 6 (ref 5.0–8.0)

## 2024-11-10 MED ORDER — CEPHALEXIN 500 MG PO CAPS
500.0000 mg | ORAL_CAPSULE | Freq: Once | ORAL | Status: AC
  Administered 2024-11-10: 500 mg via ORAL
  Filled 2024-11-10: qty 1

## 2024-11-10 MED ORDER — CEPHALEXIN 500 MG PO CAPS: 500.0000 mg | ORAL_CAPSULE | Freq: Three times a day (TID) | ORAL | 0 refills | Status: DC

## 2024-11-10 MED ORDER — CEPHALEXIN 500 MG PO CAPS: 500.0000 mg | ORAL_CAPSULE | Freq: Three times a day (TID) | ORAL | 0 refills | Status: AC

## 2024-11-10 MED ORDER — HYDROCODONE-ACETAMINOPHEN 5-325 MG PO TABS
1.0000 | ORAL_TABLET | Freq: Once | ORAL | Status: AC
  Administered 2024-11-10: 1 via ORAL
  Filled 2024-11-10: qty 1

## 2024-11-10 MED ORDER — PHENAZOPYRIDINE HCL 200 MG PO TABS: 200.0000 mg | ORAL_TABLET | Freq: Three times a day (TID) | ORAL | 0 refills | Status: AC

## 2024-11-10 MED ORDER — PHENAZOPYRIDINE HCL 100 MG PO TABS
200.0000 mg | ORAL_TABLET | Freq: Once | ORAL | Status: AC
  Administered 2024-11-10: 200 mg via ORAL
  Filled 2024-11-10: qty 2

## 2024-11-10 MED ORDER — PHENAZOPYRIDINE HCL 200 MG PO TABS: 200.0000 mg | ORAL_TABLET | Freq: Three times a day (TID) | ORAL | 0 refills | Status: DC

## 2024-11-10 NOTE — ED Triage Notes (Signed)
 Patient from home for pain with urination that started 3 days ago and worsened today. Patient has history of frequent UTIs. Upon arrival to ER, patient is alert and oriented to person and place; per EMS patient is confused at baseline
